# Patient Record
Sex: Female | Born: 1940 | Race: Black or African American | Hispanic: No | Marital: Married | Smoking: Former smoker
Health system: Southern US, Community
[De-identification: ages and names within clinical notes are randomized; demographics above are authoritative.]

## PROBLEM LIST (undated history)

## (undated) DIAGNOSIS — E782 Mixed hyperlipidemia: Secondary | ICD-10-CM

## (undated) DIAGNOSIS — M1990 Unspecified osteoarthritis, unspecified site: Secondary | ICD-10-CM

## (undated) DIAGNOSIS — E079 Disorder of thyroid, unspecified: Secondary | ICD-10-CM

## (undated) DIAGNOSIS — K611 Rectal abscess: Secondary | ICD-10-CM

## (undated) DIAGNOSIS — K219 Gastro-esophageal reflux disease without esophagitis: Secondary | ICD-10-CM

## (undated) DIAGNOSIS — I2510 Atherosclerotic heart disease of native coronary artery without angina pectoris: Secondary | ICD-10-CM

## (undated) DIAGNOSIS — K635 Polyp of colon: Secondary | ICD-10-CM

## (undated) DIAGNOSIS — I679 Cerebrovascular disease, unspecified: Secondary | ICD-10-CM

## (undated) DIAGNOSIS — E119 Type 2 diabetes mellitus without complications: Secondary | ICD-10-CM

## (undated) DIAGNOSIS — I10 Essential (primary) hypertension: Secondary | ICD-10-CM

## (undated) DIAGNOSIS — I219 Acute myocardial infarction, unspecified: Secondary | ICD-10-CM

## (undated) DIAGNOSIS — I82409 Acute embolism and thrombosis of unspecified deep veins of unspecified lower extremity: Secondary | ICD-10-CM

## (undated) DIAGNOSIS — F329 Major depressive disorder, single episode, unspecified: Secondary | ICD-10-CM

## (undated) HISTORY — DX: Atherosclerotic heart disease of native coronary artery without angina pectoris: I25.10

## (undated) HISTORY — DX: Polyp of colon: K63.5

## (undated) HISTORY — PX: SIGMOIDOSCOPY: SUR1295

## (undated) HISTORY — DX: Type 2 diabetes mellitus without complications: E11.9

## (undated) HISTORY — DX: Disorder of thyroid, unspecified: E07.9

## (undated) HISTORY — PX: COLONOSCOPY: SHX174

## (undated) HISTORY — PX: ABDOMINAL HYSTERECTOMY: SHX81

## (undated) HISTORY — DX: Mixed hyperlipidemia: E78.2

## (undated) HISTORY — DX: Cerebrovascular disease, unspecified: I67.9

## (undated) HISTORY — PX: JOINT REPLACEMENT: SHX530

## (undated) HISTORY — DX: Essential (primary) hypertension: I10

---

## 1996-02-18 DIAGNOSIS — I219 Acute myocardial infarction, unspecified: Secondary | ICD-10-CM

## 1996-02-18 HISTORY — DX: Acute myocardial infarction, unspecified: I21.9

## 1997-10-17 ENCOUNTER — Inpatient Hospital Stay (HOSPITAL_COMMUNITY): Admission: RE | Admit: 1997-10-17 | Discharge: 1997-10-20 | Payer: Self-pay | Admitting: Orthopedic Surgery

## 1997-10-20 ENCOUNTER — Inpatient Hospital Stay (HOSPITAL_COMMUNITY)
Admission: RE | Admit: 1997-10-20 | Discharge: 1997-10-25 | Payer: Self-pay | Admitting: Physical Medicine and Rehabilitation

## 1997-10-27 ENCOUNTER — Encounter: Admission: RE | Admit: 1997-10-27 | Discharge: 1998-01-25 | Payer: Self-pay

## 1997-12-18 ENCOUNTER — Ambulatory Visit (HOSPITAL_COMMUNITY): Admission: RE | Admit: 1997-12-18 | Discharge: 1997-12-18 | Payer: Self-pay | Admitting: Orthopedic Surgery

## 1998-06-12 ENCOUNTER — Ambulatory Visit (HOSPITAL_COMMUNITY): Admission: RE | Admit: 1998-06-12 | Discharge: 1998-06-12 | Payer: Self-pay | Admitting: *Deleted

## 1998-12-31 ENCOUNTER — Encounter: Payer: Self-pay | Admitting: Family Medicine

## 1998-12-31 ENCOUNTER — Encounter: Admission: RE | Admit: 1998-12-31 | Discharge: 1998-12-31 | Payer: Self-pay | Admitting: Family Medicine

## 1999-02-07 ENCOUNTER — Encounter: Admission: RE | Admit: 1999-02-07 | Discharge: 1999-02-07 | Payer: Self-pay | Admitting: Family Medicine

## 1999-02-07 ENCOUNTER — Encounter: Payer: Self-pay | Admitting: Family Medicine

## 2000-01-14 ENCOUNTER — Emergency Department (HOSPITAL_COMMUNITY): Admission: EM | Admit: 2000-01-14 | Discharge: 2000-01-14 | Payer: Self-pay | Admitting: Emergency Medicine

## 2000-01-14 ENCOUNTER — Encounter: Payer: Self-pay | Admitting: Emergency Medicine

## 2000-02-13 ENCOUNTER — Encounter: Payer: Self-pay | Admitting: Family Medicine

## 2000-02-13 ENCOUNTER — Encounter: Admission: RE | Admit: 2000-02-13 | Discharge: 2000-02-13 | Payer: Self-pay | Admitting: Family Medicine

## 2000-09-29 ENCOUNTER — Encounter (INDEPENDENT_AMBULATORY_CARE_PROVIDER_SITE_OTHER): Payer: Self-pay | Admitting: *Deleted

## 2000-09-29 ENCOUNTER — Ambulatory Visit (HOSPITAL_COMMUNITY): Admission: RE | Admit: 2000-09-29 | Discharge: 2000-09-29 | Payer: Self-pay | Admitting: *Deleted

## 2001-02-19 ENCOUNTER — Encounter: Payer: Self-pay | Admitting: Family Medicine

## 2001-02-19 ENCOUNTER — Encounter: Admission: RE | Admit: 2001-02-19 | Discharge: 2001-02-19 | Payer: Self-pay | Admitting: Family Medicine

## 2002-03-01 ENCOUNTER — Encounter: Admission: RE | Admit: 2002-03-01 | Discharge: 2002-03-01 | Payer: Self-pay | Admitting: Family Medicine

## 2002-03-01 ENCOUNTER — Encounter: Payer: Self-pay | Admitting: Family Medicine

## 2003-04-03 ENCOUNTER — Encounter: Admission: RE | Admit: 2003-04-03 | Discharge: 2003-04-03 | Payer: Self-pay | Admitting: Family Medicine

## 2003-04-11 ENCOUNTER — Ambulatory Visit (HOSPITAL_COMMUNITY): Admission: RE | Admit: 2003-04-11 | Discharge: 2003-04-11 | Payer: Self-pay | Admitting: Family Medicine

## 2004-02-20 ENCOUNTER — Ambulatory Visit: Payer: Self-pay | Admitting: Cardiovascular Disease

## 2004-02-20 ENCOUNTER — Inpatient Hospital Stay (HOSPITAL_COMMUNITY): Admission: EM | Admit: 2004-02-20 | Discharge: 2004-03-08 | Payer: Self-pay | Admitting: Emergency Medicine

## 2004-02-27 ENCOUNTER — Encounter (INDEPENDENT_AMBULATORY_CARE_PROVIDER_SITE_OTHER): Payer: Self-pay | Admitting: *Deleted

## 2004-02-27 HISTORY — PX: CORONARY ARTERY BYPASS GRAFT: SHX141

## 2004-03-29 ENCOUNTER — Ambulatory Visit: Payer: Self-pay | Admitting: *Deleted

## 2004-04-12 ENCOUNTER — Ambulatory Visit: Payer: Self-pay | Admitting: *Deleted

## 2004-04-17 ENCOUNTER — Ambulatory Visit: Payer: Self-pay | Admitting: *Deleted

## 2004-04-23 ENCOUNTER — Ambulatory Visit: Payer: Self-pay

## 2004-05-09 ENCOUNTER — Ambulatory Visit: Payer: Self-pay | Admitting: Gastroenterology

## 2004-05-15 ENCOUNTER — Ambulatory Visit: Payer: Self-pay | Admitting: Gastroenterology

## 2004-05-21 ENCOUNTER — Encounter: Admission: RE | Admit: 2004-05-21 | Discharge: 2004-05-21 | Payer: Self-pay | Admitting: Family Medicine

## 2004-06-03 ENCOUNTER — Ambulatory Visit: Payer: Self-pay | Admitting: *Deleted

## 2004-06-07 ENCOUNTER — Ambulatory Visit: Payer: Self-pay | Admitting: Gastroenterology

## 2004-08-21 ENCOUNTER — Encounter: Admission: RE | Admit: 2004-08-21 | Discharge: 2004-08-21 | Payer: Self-pay | Admitting: Family Medicine

## 2004-09-04 ENCOUNTER — Ambulatory Visit: Payer: Self-pay | Admitting: Cardiology

## 2004-09-11 ENCOUNTER — Ambulatory Visit: Payer: Self-pay | Admitting: Cardiology

## 2005-04-07 ENCOUNTER — Ambulatory Visit: Payer: Self-pay | Admitting: Cardiology

## 2005-04-11 ENCOUNTER — Emergency Department (HOSPITAL_COMMUNITY): Admission: EM | Admit: 2005-04-11 | Discharge: 2005-04-11 | Payer: Self-pay | Admitting: Emergency Medicine

## 2005-04-17 ENCOUNTER — Ambulatory Visit: Payer: Self-pay | Admitting: Cardiology

## 2005-04-21 ENCOUNTER — Ambulatory Visit: Payer: Self-pay

## 2005-04-25 ENCOUNTER — Ambulatory Visit: Payer: Self-pay | Admitting: Internal Medicine

## 2005-05-01 ENCOUNTER — Ambulatory Visit: Payer: Self-pay

## 2005-05-12 ENCOUNTER — Ambulatory Visit: Payer: Self-pay | Admitting: Internal Medicine

## 2005-05-17 ENCOUNTER — Emergency Department (HOSPITAL_COMMUNITY): Admission: EM | Admit: 2005-05-17 | Discharge: 2005-05-18 | Payer: Self-pay | Admitting: Emergency Medicine

## 2005-05-22 ENCOUNTER — Encounter: Admission: RE | Admit: 2005-05-22 | Discharge: 2005-05-22 | Payer: Self-pay | Admitting: Family Medicine

## 2005-07-02 ENCOUNTER — Ambulatory Visit: Payer: Self-pay | Admitting: Gastroenterology

## 2005-09-30 ENCOUNTER — Ambulatory Visit: Payer: Self-pay | Admitting: Cardiology

## 2005-10-08 ENCOUNTER — Ambulatory Visit: Payer: Self-pay | Admitting: Cardiology

## 2006-03-16 ENCOUNTER — Ambulatory Visit: Payer: Self-pay | Admitting: Gastroenterology

## 2006-03-16 LAB — CONVERTED CEMR LAB
Basophils Relative: 1.2 % — ABNORMAL HIGH (ref 0.0–1.0)
Eosinophils Absolute: 0.1 10*3/uL (ref 0.0–0.6)
Eosinophils Relative: 1 % (ref 0.0–5.0)
HCT: 37.7 % (ref 36.0–46.0)
Hemoglobin: 13.3 g/dL (ref 12.0–15.0)
Lymphocytes Relative: 24.6 % (ref 12.0–46.0)
MCV: 96.8 fL (ref 78.0–100.0)
Monocytes Absolute: 0.6 10*3/uL (ref 0.2–0.7)
Neutro Abs: 5.7 10*3/uL (ref 1.4–7.7)
Neutrophils Relative %: 65.8 % (ref 43.0–77.0)
WBC: 8.6 10*3/uL (ref 4.5–10.5)

## 2006-03-23 ENCOUNTER — Ambulatory Visit: Payer: Self-pay | Admitting: Gastroenterology

## 2006-03-23 LAB — CONVERTED CEMR LAB
Basophils Absolute: 0 10*3/uL (ref 0.0–0.1)
Eosinophils Absolute: 0 10*3/uL (ref 0.0–0.6)
MCHC: 34.9 g/dL (ref 30.0–36.0)
MCV: 96.6 fL (ref 78.0–100.0)
Monocytes Relative: 3.3 % (ref 3.0–11.0)
RBC: 4.15 M/uL (ref 3.87–5.11)
RDW: 12 % (ref 11.5–14.6)

## 2006-03-30 ENCOUNTER — Ambulatory Visit: Payer: Self-pay | Admitting: Gastroenterology

## 2006-04-02 ENCOUNTER — Ambulatory Visit: Payer: Self-pay | Admitting: Cardiology

## 2006-04-17 ENCOUNTER — Ambulatory Visit: Payer: Self-pay

## 2006-04-17 LAB — CONVERTED CEMR LAB
ALT: 21 units/L (ref 0–40)
Alkaline Phosphatase: 44 units/L (ref 39–117)
BUN: 25 mg/dL — ABNORMAL HIGH (ref 6–23)
Bilirubin, Direct: 0.5 mg/dL — ABNORMAL HIGH (ref 0.0–0.3)
CO2: 25 meq/L (ref 19–32)
Calcium: 9.1 mg/dL (ref 8.4–10.5)
Cholesterol: 200 mg/dL (ref 0–200)
Creatinine, Ser: 1 mg/dL (ref 0.4–1.2)
GFR calc Af Amer: 71 mL/min
Glucose, Bld: 120 mg/dL — ABNORMAL HIGH (ref 70–99)
Potassium: 4.9 meq/L (ref 3.5–5.1)
Total Bilirubin: 1.6 mg/dL — ABNORMAL HIGH (ref 0.3–1.2)
Total CHOL/HDL Ratio: 2.6
Total Protein: 6.3 g/dL (ref 6.0–8.3)
Triglycerides: 59 mg/dL (ref 0–149)

## 2006-04-21 ENCOUNTER — Ambulatory Visit: Payer: Self-pay | Admitting: Gastroenterology

## 2006-04-21 LAB — CONVERTED CEMR LAB
BUN: 30 mg/dL — ABNORMAL HIGH (ref 6–23)
Basophils Absolute: 0 10*3/uL (ref 0.0–0.1)
Basophils Relative: 0.4 % (ref 0.0–1.0)
CO2: 24 meq/L (ref 19–32)
Calcium: 9.1 mg/dL (ref 8.4–10.5)
Chloride: 102 meq/L (ref 96–112)
Creatinine, Ser: 1.2 mg/dL (ref 0.4–1.2)
Hemoglobin: 14.6 g/dL (ref 12.0–15.0)
MCHC: 34.9 g/dL (ref 30.0–36.0)
Monocytes Absolute: 0.6 10*3/uL (ref 0.2–0.7)
Monocytes Relative: 4.9 % (ref 3.0–11.0)
Platelets: 259 10*3/uL (ref 150–400)
Potassium: 3.8 meq/L (ref 3.5–5.1)
RBC: 4.36 M/uL (ref 3.87–5.11)
RDW: 12.4 % (ref 11.5–14.6)

## 2006-04-22 ENCOUNTER — Ambulatory Visit: Payer: Self-pay | Admitting: Gastroenterology

## 2006-04-22 ENCOUNTER — Encounter (INDEPENDENT_AMBULATORY_CARE_PROVIDER_SITE_OTHER): Payer: Self-pay | Admitting: *Deleted

## 2006-04-22 ENCOUNTER — Ambulatory Visit (HOSPITAL_COMMUNITY): Admission: RE | Admit: 2006-04-22 | Discharge: 2006-04-22 | Payer: Self-pay | Admitting: Gastroenterology

## 2006-04-28 ENCOUNTER — Ambulatory Visit: Payer: Self-pay | Admitting: Gastroenterology

## 2006-05-11 ENCOUNTER — Ambulatory Visit: Payer: Self-pay | Admitting: Gastroenterology

## 2006-06-18 ENCOUNTER — Encounter: Admission: RE | Admit: 2006-06-18 | Discharge: 2006-06-18 | Payer: Self-pay | Admitting: Family Medicine

## 2006-08-20 ENCOUNTER — Inpatient Hospital Stay (HOSPITAL_COMMUNITY): Admission: EM | Admit: 2006-08-20 | Discharge: 2006-08-27 | Payer: Self-pay | Admitting: Emergency Medicine

## 2006-08-20 ENCOUNTER — Ambulatory Visit: Payer: Self-pay | Admitting: Internal Medicine

## 2006-08-23 ENCOUNTER — Encounter (INDEPENDENT_AMBULATORY_CARE_PROVIDER_SITE_OTHER): Payer: Self-pay | Admitting: General Surgery

## 2006-08-23 HISTORY — PX: CHOLECYSTECTOMY: SHX55

## 2006-08-24 ENCOUNTER — Encounter (INDEPENDENT_AMBULATORY_CARE_PROVIDER_SITE_OTHER): Payer: Self-pay | Admitting: Internal Medicine

## 2006-10-01 ENCOUNTER — Ambulatory Visit: Payer: Self-pay | Admitting: Cardiology

## 2006-10-13 ENCOUNTER — Ambulatory Visit: Payer: Self-pay | Admitting: Cardiology

## 2006-10-13 LAB — CONVERTED CEMR LAB
BUN: 21 mg/dL (ref 6–23)
CO2: 27 meq/L (ref 19–32)
Calcium: 9.5 mg/dL (ref 8.4–10.5)
Chloride: 105 meq/L (ref 96–112)
Creatinine, Ser: 1 mg/dL (ref 0.4–1.2)
GFR calc Af Amer: 71 mL/min
Glucose, Bld: 102 mg/dL — ABNORMAL HIGH (ref 70–99)

## 2007-06-11 ENCOUNTER — Ambulatory Visit: Payer: Self-pay | Admitting: Gastroenterology

## 2007-06-21 ENCOUNTER — Encounter: Admission: RE | Admit: 2007-06-21 | Discharge: 2007-06-21 | Payer: Self-pay | Admitting: Family Medicine

## 2007-06-25 ENCOUNTER — Ambulatory Visit: Payer: Self-pay | Admitting: Gastroenterology

## 2007-06-25 ENCOUNTER — Encounter: Payer: Self-pay | Admitting: Gastroenterology

## 2007-06-28 ENCOUNTER — Encounter: Payer: Self-pay | Admitting: Gastroenterology

## 2007-07-07 ENCOUNTER — Ambulatory Visit: Payer: Self-pay | Admitting: Cardiology

## 2007-07-14 ENCOUNTER — Ambulatory Visit: Payer: Self-pay | Admitting: Cardiology

## 2007-07-14 LAB — CONVERTED CEMR LAB
ALT: 11 units/L (ref 0–35)
AST: 20 units/L (ref 0–37)
Albumin: 3.2 g/dL — ABNORMAL LOW (ref 3.5–5.2)
BUN: 15 mg/dL (ref 6–23)
Calcium: 9.3 mg/dL (ref 8.4–10.5)
Chloride: 103 meq/L (ref 96–112)
Cholesterol: 123 mg/dL (ref 0–200)
Creatinine, Ser: 0.7 mg/dL (ref 0.4–1.2)
GFR calc non Af Amer: 88 mL/min
HDL: 43.9 mg/dL (ref >39.0)
LDL Cholesterol: 64 mg/dL (ref 0–99)
TSH: 0.99 microintl units/mL (ref 0.35–5.50)
Total CHOL/HDL Ratio: 2.8
Triglycerides: 75 mg/dL (ref 0–149)

## 2007-07-20 ENCOUNTER — Telehealth: Payer: Self-pay | Admitting: Gastroenterology

## 2008-02-18 DIAGNOSIS — I679 Cerebrovascular disease, unspecified: Secondary | ICD-10-CM

## 2008-02-18 HISTORY — DX: Cerebrovascular disease, unspecified: I67.9

## 2008-06-21 ENCOUNTER — Encounter: Admission: RE | Admit: 2008-06-21 | Discharge: 2008-06-21 | Payer: Self-pay | Admitting: Family Medicine

## 2008-07-10 ENCOUNTER — Telehealth: Payer: Self-pay | Admitting: Cardiology

## 2008-07-11 DIAGNOSIS — K529 Noninfective gastroenteritis and colitis, unspecified: Secondary | ICD-10-CM

## 2008-07-11 DIAGNOSIS — I2510 Atherosclerotic heart disease of native coronary artery without angina pectoris: Secondary | ICD-10-CM | POA: Insufficient documentation

## 2008-07-11 DIAGNOSIS — I679 Cerebrovascular disease, unspecified: Secondary | ICD-10-CM

## 2008-07-11 DIAGNOSIS — I10 Essential (primary) hypertension: Secondary | ICD-10-CM | POA: Insufficient documentation

## 2008-07-11 DIAGNOSIS — E785 Hyperlipidemia, unspecified: Secondary | ICD-10-CM

## 2008-07-12 ENCOUNTER — Ambulatory Visit: Payer: Self-pay | Admitting: Cardiology

## 2008-07-12 DIAGNOSIS — F17200 Nicotine dependence, unspecified, uncomplicated: Secondary | ICD-10-CM | POA: Insufficient documentation

## 2008-07-12 DIAGNOSIS — R05 Cough: Secondary | ICD-10-CM

## 2008-07-14 ENCOUNTER — Telehealth: Payer: Self-pay | Admitting: Cardiology

## 2008-07-20 ENCOUNTER — Ambulatory Visit: Payer: Self-pay

## 2008-07-24 ENCOUNTER — Encounter: Payer: Self-pay | Admitting: Cardiology

## 2008-07-27 ENCOUNTER — Telehealth (INDEPENDENT_AMBULATORY_CARE_PROVIDER_SITE_OTHER): Payer: Self-pay | Admitting: *Deleted

## 2008-08-02 ENCOUNTER — Telehealth: Payer: Self-pay | Admitting: Cardiology

## 2008-08-04 ENCOUNTER — Emergency Department (HOSPITAL_COMMUNITY): Admission: EM | Admit: 2008-08-04 | Discharge: 2008-08-04 | Payer: Self-pay | Admitting: Family Medicine

## 2008-08-04 ENCOUNTER — Ambulatory Visit (HOSPITAL_COMMUNITY): Admission: RE | Admit: 2008-08-04 | Discharge: 2008-08-04 | Payer: Self-pay | Admitting: Cardiology

## 2008-08-15 ENCOUNTER — Telehealth: Payer: Self-pay | Admitting: Cardiology

## 2008-09-20 ENCOUNTER — Encounter (HOSPITAL_COMMUNITY): Admission: RE | Admit: 2008-09-20 | Discharge: 2008-11-21 | Payer: Self-pay | Admitting: Internal Medicine

## 2008-10-05 ENCOUNTER — Ambulatory Visit: Payer: Self-pay | Admitting: *Deleted

## 2008-11-28 ENCOUNTER — Telehealth: Payer: Self-pay | Admitting: Cardiology

## 2008-12-12 ENCOUNTER — Telehealth: Payer: Self-pay | Admitting: Cardiology

## 2008-12-27 ENCOUNTER — Telehealth: Payer: Self-pay | Admitting: Cardiology

## 2009-03-29 ENCOUNTER — Ambulatory Visit: Payer: Self-pay | Admitting: Gastroenterology

## 2009-03-29 LAB — CONVERTED CEMR LAB
AST: 22 units/L (ref 0–37)
Alkaline Phosphatase: 64 units/L (ref 39–117)
Basophils Absolute: 0 10*3/uL (ref 0.0–0.1)
Bilirubin, Direct: 0.2 mg/dL (ref 0.0–0.3)
Lymphocytes Relative: 26.6 % (ref 12.0–46.0)
Lymphs Abs: 1.9 10*3/uL (ref 0.7–4.0)
Monocytes Relative: 5 % (ref 3.0–12.0)
Neutrophils Relative %: 64.4 % (ref 43.0–77.0)
Platelets: 207 10*3/uL (ref 150.0–400.0)
RDW: 13 % (ref 11.5–14.6)
Total Bilirubin: 0.6 mg/dL (ref 0.3–1.2)

## 2009-04-17 ENCOUNTER — Ambulatory Visit: Payer: Self-pay | Admitting: Vascular Surgery

## 2009-06-26 ENCOUNTER — Encounter: Admission: RE | Admit: 2009-06-26 | Discharge: 2009-06-26 | Payer: Self-pay | Admitting: Family Medicine

## 2009-06-29 ENCOUNTER — Ambulatory Visit: Payer: Self-pay | Admitting: Cardiology

## 2009-07-23 ENCOUNTER — Telehealth (INDEPENDENT_AMBULATORY_CARE_PROVIDER_SITE_OTHER): Payer: Self-pay | Admitting: *Deleted

## 2009-07-24 ENCOUNTER — Ambulatory Visit: Payer: Self-pay | Admitting: Cardiology

## 2009-07-24 ENCOUNTER — Ambulatory Visit: Payer: Self-pay

## 2009-07-24 ENCOUNTER — Ambulatory Visit: Payer: Self-pay | Admitting: Cardiovascular Disease

## 2009-07-24 ENCOUNTER — Encounter (HOSPITAL_COMMUNITY): Admission: RE | Admit: 2009-07-24 | Discharge: 2009-08-14 | Payer: Self-pay | Admitting: Cardiology

## 2009-07-24 ENCOUNTER — Encounter: Payer: Self-pay | Admitting: Cardiovascular Disease

## 2009-07-26 ENCOUNTER — Telehealth: Payer: Self-pay | Admitting: Cardiology

## 2010-03-11 ENCOUNTER — Encounter: Payer: Self-pay | Admitting: Cardiology

## 2010-03-17 LAB — CONVERTED CEMR LAB
AST: 20 units/L (ref 0–37)
Albumin: 3.7 g/dL (ref 3.5–5.2)
Alkaline Phosphatase: 62 units/L (ref 39–117)
BUN: 19 mg/dL (ref 6–23)
Bilirubin, Direct: 0.2 mg/dL (ref 0.0–0.3)
Calcium: 9.2 mg/dL (ref 8.4–10.5)
Chloride: 103 meq/L (ref 96–112)
Chloride: 106 meq/L (ref 96–112)
Cholesterol: 117 mg/dL (ref 0–200)
Cholesterol: 139 mg/dL (ref 0–200)
Creatinine, Ser: 1 mg/dL (ref 0.4–1.2)
HDL: 60.3 mg/dL (ref >39.00)
LDL Cholesterol: 47 mg/dL (ref 0–99)
Potassium: 4.1 meq/L (ref 3.5–5.1)
Sodium: 141 meq/L (ref 135–145)
Total Protein: 6.1 g/dL (ref 6.0–8.3)
Total Protein: 6.9 g/dL (ref 6.0–8.3)
Triglycerides: 74 mg/dL (ref 0.0–149.0)
VLDL: 8.4 mg/dL (ref 0.0–40.0)

## 2010-03-21 NOTE — Progress Notes (Signed)
Summary: Nuclear Pre-Procedure  Phone Note Outgoing Call   Call placed by: Milana Na, EMT-P,  July 23, 2009 3:40 PM Summary of Call: Reviewed information on Myoview Information Sheet (see scanned document for further details).  Spoke with patient.     Nuclear Med Background Indications for Stress Test: Evaluation for Ischemia, Graft Patency   History: CABG, COPD, Echo, Myocardial Perfusion Study  History Comments: '06 CABG x5  COPD per CXR  '08 MPS NL EF 77% 07/08 ECHO EF 55-60% mild AI and TR  Symptoms: DOE, SOB    Nuclear Pre-Procedure Cardiac Risk Factors: Carotid Disease, Hypertension, Lipids, Smoker Height (in): 69  Nuclear Med Study Referring MD:  B.Crenshaw

## 2010-03-21 NOTE — Assessment & Plan Note (Signed)
Summary: Cardiology Nuclear Study  Nuclear Med Background Indications for Stress Test: Evaluation for Ischemia, Graft Patency   History: CABG, COPD, Echo, Myocardial Perfusion Study  History Comments: '06 CABG x 5; '08 EAV:WUJWJX, EF=77%; 7/08 Echo:EF=55-60%, mild AI and TR; h/o ulcerative colitis  Symptoms: Dizziness, DOE, Fatigue, SOB    Nuclear Pre-Procedure Cardiac Risk Factors: Carotid Disease, History of Smoking, Hypertension, Lipids, Obesity, Smoker Caffeine/Decaff Intake: None NPO After: 10:00 PM Lungs: Minimal expiratory wheezes.  Albuterol inhaler used prior to infusion.  O2 Sat 96% on RA. IV 0.9% NS with Angio Cath: 22g     IV Site: (R) wrist IV Started by: Stanton Kidney EMT-P Chest Size (in) 44     Cup Size B     Height (in): 69 Weight (lb): 226 BMI: 33.50 Tech Comments: Held metoprolol held x 24 hours; no meds. taken this a.m., per patient. Patient quit smoking x 5 days.  Nuclear Med Study 1 or 2 day study:  1 day     Stress Test Type:  Eugenie Birks Reading MD:  Charlton Haws, MD     Referring MD:  Olga Millers, MD Resting Radionuclide:  Technetium 67m Tetrofosmin     Resting Radionuclide Dose:  10.6 mCi  Stress Radionuclide:  Technetium 75m Tetrofosmin     Stress Radionuclide Dose:  33 mCi   Stress Protocol   Lexiscan: 0.4 mg   Stress Test Technologist:  Rea [employer] CMA-N     Nuclear Technologist:  Domenic Polite CNMT  Rest Procedure  Myocardial perfusion imaging was performed at rest 45 minutes following the intravenous administration of Myoview Technetium 92m Tetrofosmin.  Stress Procedure  The patient received IV Lexiscan 0.4 mg over 15-seconds.  Myoview injected at 30-seconds.  There were no significant changes with infusion.  Quantitative spect images were obtained after a 45 minute delay.  QPS Raw Data Images:  Normal; no motion artifact; normal heart/lung ratio. Stress Images:  NI: Uniform and normal uptake of tracer in all myocardial segments. Rest  Images:  Normal homogeneous uptake in all areas of the myocardium. Subtraction (SDS):  Normal Transient Ischemic Dilatation:  .98  (Normal <1.22)  Lung/Heart Ratio:  .25  (Normal <0.45)  Quantitative Gated Spect Images QGS EDV:  70 ml QGS ESV:  16 ml QGS EF:  76 % QGS cine images:  normal  Findings Normal nuclear study      Overall Impression  Exercise Capacity: Lexiscan BP Response: Normal blood pressure response. Clinical Symptoms: "Woozy" ECG Impression: No significant ST segment change suggestive of ischemia. Overall Impression: Normal stress nuclear study.  Appended Document: Cardiology Nuclear Study PT AWARE./CY  Appended Document: Cardiology Nuclear Study ok

## 2010-03-21 NOTE — Miscellaneous (Signed)
Summary: Updated Thyrpoid med  Clinical Lists Changes  Medications: Added new medication of METHIMAZOLE 5 MG TABS (METHIMAZOLE) 1 by mouth once daily

## 2010-03-21 NOTE — Assessment & Plan Note (Signed)
Summary: F/U for Lialda refills-rs   History of Present Illness Visit Type: Follow-up Visit Primary GI MD: Melvia Heaps MD [facility] Primary Provider: Doneen Poisson, MD Chief Complaint: Patient here to get refills on her Lialda. She states that she only sees [facility] when she wipes her rectum too hard after a BM.  History of Present Illness:   [name] has returned for followup of her left-sided colitis.  She remains only although 4.8 g daily.  Left-sided colitis was diagnosed in 2006.  Colonoscopy in May, 2009 showed very mild colitis involving the left colon.  She is feeling well and has no GI complaints.   GI Review of Systems      Denies abdominal pain, acid reflux, belching, bloating, chest pain, dysphagia with liquids, dysphagia with solids, heartburn, loss of appetite, nausea, vomiting, vomiting blood, weight loss, and  weight gain.        Denies anal fissure, black tarry stools, change in bowel habit, constipation, diarrhea, diverticulosis, fecal incontinence, heme positive stool, hemorrhoids, irritable bowel syndrome, jaundice, light color stool, liver problems, rectal bleeding, and  rectal pain.    Current Medications (verified): 1)  Lialda 1.2 Gm  Tbec (Mesalamine) .... Take 4 Tablets By Mouth Once Daily 2)  Crestor 40 Mg Tabs (Rosuvastatin Calcium) .... Take 1 Tablet By Mouth At Bedtime 3)  Diovan 320 Mg Tabs (Valsartan) .Marland Kitchen.. 1 Tab By Mouth Once Daily 4)  Hydrochlorothiazide 12.5 Mg Caps (Hydrochlorothiazide) .Marland Kitchen.. 1 Tab By Mouth Once Daily 5)  Metoprolol Tartrate 25 Mg Tabs (Metoprolol Tartrate) .Marland Kitchen.. 1 Tab By Mouth Two Times A Day 6)  Thryroid Medication .... Take One By Mouth Once Daily  Allergies: 1)  Aspirin (Aspirin)  Past History:  Past Medical History: CAD, UNSPECIFIED SITE (ICD-414.00) CEREBROVASCULAR DISEASE (ICD-437.9) HYPERLIPIDEMIA-MIXED (ICD-272.4) HYPERTENSION, UNSPECIFIED (ICD-401.9) ULCERATIVE COLITIS, HX OF (ICD-V12.79) Diverticulosis Hx of colon polyps   2006 Thyroid disease  Past Surgical History: Reviewed history from 07/11/2008 and no changes required. Cholecystectomy with cholangiogram..08/23/2006  CABG x5.. 02/27/2004 Intraoperative transesophageal echocardiography (TEE)...02/27/2004  Family History: Reviewed history from 07/11/2008 and no changes required. Siblings: Brother had Crohn's disease... Another brother had an undefined colitis Family History of Cancer:  Mother deceased with cancer of the stomach Family History of CVA or Stroke:  Father deceased, CVA.   Siblings:  sister had brain tumor.  Social History: Tobacco Use -yes 4 ciggs per day Alcohol Use - no..quit in 2007 Drug Use - no Daily Caffeine Use rare coffee  Review of Systems       The patient complains of allergy/sinus, arthritis/joint pain, back pain, cough, fatigue, muscle pains/cramps, shortness of breath, and sleeping problems.  The patient denies anemia, anxiety-new, blood in urine, breast changes/lumps, change in vision, confusion, coughing up blood, depression-new, fainting, fever, headaches-new, hearing problems, heart murmur, heart rhythm changes, itching, menstrual pain, night sweats, nosebleeds, pregnancy symptoms, skin rash, sore throat, swelling of feet/legs, swollen lymph glands, thirst - excessive , urination - excessive , urination changes/pain, urine leakage, vision changes, and voice change.         All other systems were reviewed and were negative   Vital Signs:  Patient profile:   70 year old female Height:      69 inches Weight:      233.4 pounds BMI:     34.59 Pulse rate:   76 / minute Pulse rhythm:   regular BP sitting:   98 / 64  (left arm) Cuff size:   regular  Vitals Entered By:  Harlow Mares CMA Duncan Dull) (March 29, 2009 10:01 AM)  Physical Exam  Additional Exam:  She is a healthy-appearing female  skin: anicteric HEENT: normocephalic; PEERLA; no nasal or pharyngeal abnormalities neck: supple nodes: no cervical  lymphadenopathy chest: clear to ausculatation and percussion heart: no murmurs, gallops, or rubs abd: soft, nontender; BS normoactive; no abdominal masses, tenderness, organomegaly rectal: deferred ext: no cynanosis, clubbing, edema skeletal: no deformities neuro: oriented x 3; no focal abnormalities    Impression & Recommendations:  Problem # 1:  ULCERATIVE COLITIS, HX OF (ICD-V12.79) the patient continues to do well on lialda.  Recommendations #1 reduce lialda 22.4 g daily Orders: TLB-CBC Platelet - w/Differential (85025-CBCD) TLB-Hepatic/Liver Function Pnl (80076-HEPATIC)  Problem # 2:  CEREBROVASCULAR DISEASE (ICD-437.9) Assessment: Comment Only  Problem # 3:  CAD, UNSPECIFIED SITE (ICD-414.00) Assessment: Comment Only  Patient Instructions: 1)  cc Dr. Clyda Greener

## 2010-03-21 NOTE — Assessment & Plan Note (Signed)
Summary: 1 YR F/U   Primary Provider:  Doneen Poisson, MD  CC:  sob.  History of Present Illness: [name] is a very pleasant female that I have seen in the past for coronary artery disease.  She has had prior coronary artery bypass grafting in 2006.  Her LV function is normal.  Echocardiogram in July of 2008 revealed normal LV function, mild aortic insufficiency and mild tricuspid regurgitation. Her most recent Myoview was performed on April 17, 2006.  There was no scar or ischemia and her ejection fraction was 77%.  There was mild breast attenuation. Carotid Doppler in June of 2010 and were technically difficult and CTA was recommended to evaluate this and thyroid. This revealed extensive atherosclerotic plaque at both carotid bifurcation regions.  On the right, the lumen is not narrowed beyond that of the more distal cervical ICA.  On the left, at the distal bulb, the lumen is narrowed to 2 mm.  This indicates a 60% stenosis when compared to the more distal cervical ICA. I believe there are stenoses of both vertebral artery origins approximately 50% in severity.  The left vertebral artery takes a direct origin from the arch. Thyroid was enlarged and felt to be consistent with goiter. I last saw her in May 2010. Since then she has dyspnea with more extreme activities but not with routine activities. It is relieved with rest. There is no orthopnea, PND, pedal edema, palpitations or syncope. Note she does not have associated chest pain or dyspnea. She does not have exertional chest pain.  Current Medications (verified): 1)  Lialda 1.2 Gm  Tbec (Mesalamine) .... Take 4 Tablets By Mouth Once Daily 2)  Crestor 40 Mg Tabs (Rosuvastatin Calcium) .... Take 1 Tablet By Mouth At Bedtime 3)  Diovan 320 Mg Tabs (Valsartan) .Marland Kitchen.. 1 Tab By Mouth Once Daily 4)  Hydrochlorothiazide 12.5 Mg Caps (Hydrochlorothiazide) .Marland Kitchen.. 1 Tab By Mouth Once Daily 5)  Metoprolol Tartrate 25 Mg Tabs (Metoprolol Tartrate) .Marland Kitchen.. 1 Tab  By Mouth Two Times A Day 6)  Methimazole 5 Mg Tabs (Methimazole) .Marland Kitchen.. 1 By Mouth Once Daily  Allergies: 1)  Aspirin (Aspirin)  Past History:  Past Medical History: Reviewed history from 03/29/2009 and no changes required. CAD, UNSPECIFIED SITE (ICD-414.00) CEREBROVASCULAR DISEASE (ICD-437.9) HYPERLIPIDEMIA-MIXED (ICD-272.4) HYPERTENSION, UNSPECIFIED (ICD-401.9) ULCERATIVE COLITIS, HX OF (ICD-V12.79) Diverticulosis Hx of colon polyps  2006 Thyroid disease  Past Surgical History: Cholecystectomy with cholangiogram..08/23/2006  CABG x5.. 02/27/2004  Social History: Reviewed history from 03/29/2009 and no changes required. Tobacco Use -yes 4 ciggs per day Alcohol Use - no..quit in 2007 Drug Use - no Daily Caffeine Use rare coffee  Review of Systems       Some pain in the lower back but no fevers or chills, productive cough, hemoptysis, dysphasia, odynophagia, melena, hematochezia, dysuria, hematuria, rash, seizure activity, orthopnea, PND, pedal edema, claudication. Remaining systems are negative.   Vital Signs:  Patient profile:   70 year old female Height:      69 inches Weight:      227 pounds BMI:     33.64 Pulse rate:   69 / minute Resp:     14 per minute BP sitting:   88 / 64  (left arm)  Vitals Entered By: Kem Parkinson (Jun 29, 2009 9:29 AM)  Physical Exam  General:  Well-developed well-nourished in no acute distress.  Skin is warm and dry.  HEENT is normal.  Neck is supple. No thyromegaly.  Chest is clear to auscultation  with normal expansion.  Cardiovascular exam is regular rate and rhythm.  Abdominal exam nontender or distended. No masses palpated. Extremities show no edema. neuro grossly intact    EKG  Procedure date:  06/29/2009  Findings:      Sinus rhythm at a rate of 69. No significant ST changes. Poor R-wave progression.  Impression & Recommendations:  Problem # 1:  TOBACCO ABUSE (ICD-305.1) Patient counseled on  discontinuing.  Problem # 2:  CAD, UNSPECIFIED SITE (ICD-414.00)  Continue beta blocker and statin. He is not on Plavix and apparently this caused increased bleeding from diverticula. I have asked her to discuss a baby aspirin with her gastroenterologist. Her updated medication list for this problem includes:    Metoprolol Tartrate 25 Mg Tabs (Metoprolol tartrate) .Marland Kitchen... 1 tab by mouth two times a day  Her updated medication list for this problem includes:    Metoprolol Tartrate 25 Mg Tabs (Metoprolol tartrate) .Marland Kitchen... 1 tab by mouth two times a day  Problem # 3:  DYSPNEA (ICD-786.05) We are planning Myoview for risk stratification. Most likely secondary to deconditioning. Her updated medication list for this problem includes:    Diovan 160 Mg Tabs (Valsartan) .Marland Kitchen... Take one tablet by mouth daily    Hydrochlorothiazide 12.5 Mg Caps (Hydrochlorothiazide) .Marland Kitchen... 1 tab by mouth once daily    Metoprolol Tartrate 25 Mg Tabs (Metoprolol tartrate) .Marland Kitchen... 1 tab by mouth two times a day  Orders: Nuclear Stress Test (Nuc Stress Test)  Problem # 4:  CEREBROVASCULAR DISEASE (ICD-437.9) Continue statin. No aspirin as described above. Followup carotid Dopplers. Orders: Carotid Duplex (Carotid Duplex)  Problem # 5:  HYPERLIPIDEMIA-MIXED (ICD-272.4)  Continue statin. Check lipids and liver. Her updated medication list for this problem includes:    Crestor 40 Mg Tabs (Rosuvastatin calcium) .Marland Kitchen... Take 1 tablet by mouth at bedtime  Her updated medication list for this problem includes:    Crestor 40 Mg Tabs (Rosuvastatin calcium) .Marland Kitchen... Take 1 tablet by mouth at bedtime  Problem # 6:  HYPERTENSION, UNSPECIFIED (ICD-401.9) Blood pressure low. Decrease Diovan to 160 mg p.o. daily. Check bmet. Her updated medication list for this problem includes:    Diovan 160 Mg Tabs (Valsartan) .Marland Kitchen... Take one tablet by mouth daily    Hydrochlorothiazide 12.5 Mg Caps (Hydrochlorothiazide) .Marland Kitchen... 1 tab by mouth once  daily    Metoprolol Tartrate 25 Mg Tabs (Metoprolol tartrate) .Marland Kitchen... 1 tab by mouth two times a day  Problem # 7:  ULCERATIVE COLITIS, HX OF (ICD-V12.79)  Patient Instructions: 1)  Your physician recommends that you schedule a follow-up appointment in: ONE YEAR 2)  Your physician recommends that you return for lab work EA:VWUJ STRESS TEST-BMP/LIPID/LIVER/401.1/272.0/V58.69 3)  Your physician has recommended you make the following change in your medication: DECREASE DIOVAN 160MG  ONCE DAILY 4)  Your physician has requested that you have a carotid duplex. This test is an ultrasound of the carotid arteries in your neck. It looks at blood flow through these arteries that supply the brain with blood. Allow one hour for this exam. There are no restrictions or special instructions. 5)  Your physician has requested that you have an exercise stress myoview.  For further information please visit [email or web address]  Please follow instruction sheet, as given. Prescriptions: DIOVAN 160 MG TABS (VALSARTAN) Take one tablet by mouth daily  #90 x 3   Entered by:   Deliah Goody, RN   Authorized by:   Ferman Hamming, MD, [facility]   Signed by:   Stanton Kidney  Betsey Holiday, RN on 06/29/2009   Method used:   Electronically to        [facility]* (mail-order)             ,          Ph: [phone]       Fax: [phone]   RxID:   [id number]

## 2010-03-21 NOTE — Progress Notes (Signed)
Summary: RTN CALL  Phone Note Call from Patient Call back at Home Phone [phone]   Caller: Patient Reason for Call: Talk to Nurse Summary of Call: RTN CALL Initial call taken by: Glynda Jaeger,  July 26, 2009 4:35 PM  Follow-up for Phone Call        Left message to call back. Julieta Gutting, RN, BSN  July 27, 2009 6:25 PM  Additional Follow-up for Phone Call Additional follow up Details #1::        Pt was returning a call from last week.  She said she had gotten the results of her labs and the stress test, but not the carotid dopplers.  So I gave her the results.  She verbalized understanding. Additional Follow-up by: Minerva Areola, RN, BSN,  July 30, 2009 8:54 AM

## 2010-03-21 NOTE — Procedures (Signed)
Summary: Colonoscopy/GCDD  Colonoscopy/[facility]D   Imported By: Sherian Rein 03/29/2009 15:17:45  _____________________________________________________________________  External Attachment:    Type:   Image     Comment:   External Document

## 2010-03-21 NOTE — Procedures (Signed)
Summary: Flexible Sigmoidoscopy   Flexible Sigmoidoscopy  Procedure date:  04/22/2006  Findings:      556.9:Colitis 562.10: Diverticulosis  Comments:      Location: [facility].   Flexible Sigmoidoscopy  Procedure date:  04/22/2006  Findings:      556.9:Colitis 562.10: Diverticulosis  Comments:      Location: [facility].  Patient Name: [name], [name] MRN: [id number] Procedure Procedures: Flexible Proctosigmoidoscopy CPT: [id number].    with biopsy. CPT: 45331.  Personnel: Endoscopist: Barbette Hair. Arlyce Dice, MD.  Indications Symptoms: Diarrhea.  History  Current Medications: Patient is not currently taking Coumadin.  Allergies: No known allergies. Allergic to NSAIDA, ASA, Zocor.  Pre-Exam Physical: Performed Apr 22, 2006. Entire physical exam was normal.  Exam Exam: Extent visualized: Descending Colon. Extent of exam: 50 cm. ASA Classification: II. Tolerance: good.  Sedation Meds: Fentanyl 50 mcg. given IV. Versed 6 mg. given IV.  Findings - DIVERTICULOSIS: Descending Colon to Sigmoid Colon. ICD9: Diverticulosis: 562.10. Comments: Scattered diverticula.  - MUCOSAL ABNORMALITY: Descending Colon to Rectum. Erythema present. Erosions present, edematous, Activity level moderate, Biopsy/Misc Colitis taken. ICD9: Colitis, Ulcerative: 556.9. Comments: Diffuse, moderate inflammatory changes beginning in rectum and extending into left colon with most severe changes in sigmoid between 20 and 30 cm.  There's punctate erythema above this area.   Assessment Abnormal examination, see findings above.  Diagnoses: 556.9: Colitis, Ulcerative.  562.10: Diverticulosis.   Events  Unplanned Intervention: No intervention was required.  Unplanned Events: There were no complications. Plans Medication Plan: Continue current medications. Anti-diarrheal: Cortfoam 1 HS, starting Apr 22, 2006   Scheduling: Office Visit, to [employer]. Arlyce Dice, MD, around May 02, 2006.      This report was created from the original endoscopy report, which was reviewed and signed by the above listed endoscopist.

## 2010-03-21 NOTE — Procedures (Signed)
Summary: colonoscopy   Colonoscopy  Procedure date:  06/25/2007  Findings:      Results: Colitis.       Location:  [facility].   Patient Name: [name], [name] MRN: [id number] Procedure Procedures: Colonoscopy CPT: 03474.    with biopsy. CPT: [id number].  Personnel: Endoscopist: Barbette Hair. Arlyce Dice, MD.  Exam Location: Outpatient  Patient Consent: Procedure, Alternatives, Risks and Benefits discussed, consent obtained, from patient.  Indications  Surveillance of: Ulcerative Colitis. 2006.  History  Current Medications: Patient is not currently taking Coumadin.  Allergies: No known allergies. Allergic to NSAIDA, ASA, Zocor.  Pre-Exam Physical: Performed Jun 25, 2007. Cardio-pulmonary exam, HEENT exam , Abdominal exam, Mental status exam WNL.  Comments: Patient history reviewed/updated, physical performed prior to initiation of sedation?yes Exam Exam: Extent of exam reached: Cecum, extent intended: Cecum.  The cecum was identified by appendiceal orifice and IC valve. Time to Cecum: 00:01: 51. Time for Withdrawl: 00:06:30. Colon retroflexion performed. ASA Classification: II. Tolerance: good.  Monitoring: Pulse and BP monitoring, Oximetry used. Supplemental O2 given. at 2 Liters.  Colon Prep Used Miralax for colon prep. Prep results: good.  Sedation Meds: Patient assessed and found to be appropriate for moderate (conscious) sedation. Sedation was managed by the Endoscopist. Fentanyl 50 mcg. given IV. Versed 5 mg. given IV.  Findings - NORMAL EXAM: Cecum to Sigmoid Colon.  NORMAL EXAM: Cecum.  - MUCOSAL ABNORMALITY: Sigmoid Colon. Erythema present. Erosions absent, Granularity absent, bleeding absent, Haustral folds normal. Activity level inactive, absent ulcers present. Biopsy/Mucosal Abn. taken. ICD9: Colitis, Unspecified: 558.9. Comments: 10cm segment from 20-30cm from anus with areas of submucosal hemorrhage.  Minimal erythema.  Random biopsies were  taken.  - NORMAL EXAM: Sigmoid Colon to Rectum.   Assessment Abnormal examination, see findings above.  Diagnoses: 558.9: Colitis, Unspecified.   Events  Unplanned Interventions: No intervention was required.  Unplanned Events: There were no complications. Plans  Post Exam Instructions: Post sedation instructions given.  Medication Plan: Continue current medications.  Disposition: After procedure patient sent to recovery. After recovery patient sent home.  Scheduling/Referral: Office Visit, to [employer]. Arlyce Dice, MD, around Dec 26, 2007.    This report was created from the original endoscopy report, which was reviewed and signed by the above listed endoscopist.

## 2010-05-09 ENCOUNTER — Other Ambulatory Visit: Payer: Self-pay | Admitting: Cardiology

## 2010-05-16 NOTE — Telephone Encounter (Signed)
[address] °

## 2010-05-18 ENCOUNTER — Other Ambulatory Visit: Payer: Self-pay | Admitting: Gastroenterology

## 2010-07-02 ENCOUNTER — Other Ambulatory Visit: Payer: Self-pay | Admitting: Family Medicine

## 2010-07-02 DIAGNOSIS — Z1231 Encounter for screening mammogram for malignant neoplasm of breast: Secondary | ICD-10-CM

## 2010-07-02 NOTE — H&P (Signed)
[name], [name]                 ACCOUNT NO.:  [id number]   MEDICAL RECORD NO.:  [id number]          PATIENT TYPE:  INP   LOCATION:  1409                         FACILITY:  [facility]   PHYSICIAN:  Anselm Pancoast. Weatherly, M.D.DATE OF BIRTH:  1941/01/03   DATE OF ADMISSION:  08/20/2006  DATE OF DISCHARGE:                              HISTORY & PHYSICAL   CHIEF COMPLAINT:  Epigastric pain.   HISTORY:  [name] is a 70 year old female, moderately overweight, on  oral medication for diabetes, who was admitted by the [facility] on  the 3rd, after presenting to the emergency room with epigastric right  lower chest discomfort.  She was seen in the ER with a EKG and then a CT  obtained.  The CT showed a stone in the gallbladder.  The stone had been  noted in the gallbladder in a CT in 2001, and the patient probably had  some reaction to the IV contrast, and then she experienced shortness of  breath and etc. and was admitted to the [facility] team at that time.  She has been on insulin.  A Foley catheter had been inserted.  Her  symptoms appear to have subsided, and the patient was informed about  stone, which she said she was not aware that she has had.  She wanted it  removed this admission.  I do think that the pain that she had in the  home before coming to the emergency room was certainly consistent with a  mild gallbladder attack.  The patient's liver function studies are  normal, as are her white blood count, and she is overweight but  otherwise has had no previous abdominal surgery, and I think would be an  adequate candidate for laparoscopic cholecystectomy, if she is cleared  for cardiology.  She had an MI and had cardiac bypass surgery  approximately 2 years ago, but does not give any history of angina or  acute symptoms at this time.  The cardiologist did see her, thought she  was at low risk, checked a lot of cardiac enzymes which were  unremarkable, and we will plan on adding  her to the OR schedule for  laparoscopic cholecystectomy with cholangiogram on the a.m. of the 6th.  The patient has been on Rocephin, and has been on Lovenox.  I am going  to hold the Lovenox ordered for tomorrow morning, but we will resume it  after surgery, if she is in the hospital any significant length of time.  The patient is in agreement with this plan.           ______________________________  Anselm Pancoast. Zachery Dakins, M.D.     WJW/MEDQ  D:  08/23/2006  T:  08/23/2006  Job:  [id number]   cc:   Patient Chart   Anselm Pancoast. Zachery Dakins, M.D.  [address], [address]  [place]  [place] [address]

## 2010-07-02 NOTE — H&P (Signed)
[name], [name]                 ACCOUNT NO.:  [id number]   MEDICAL RECORD NO.:  [id number]          PATIENT TYPE:  INP   LOCATION:  1409                         FACILITY:  [facility]   PHYSICIAN:  Herbie Saxon, MDDATE OF BIRTH:  August 28, 1940   DATE OF ADMISSION:  08/20/2006  DATE OF DISCHARGE:                              HISTORY & PHYSICAL   PRIMARY CARE PHYSICIAN:  Dr. Clyda Greener   GASTROENTEROLOGIST:  Barbette Hair. Arlyce Dice, M.D., Clementeen Graham   CARDIOLOGIST:  Madolyn Frieze. Jens Som, M.D., F.A.C.C., [place]   HISTORY OF PRESENT ILLNESS:  This 70 year old African-American lady woke  up at 4 a.m. this morning with sudden onset right upper quadrant and  right flank abdominal pain, which was severe, 10/10, sharp,  nonradiating, associated with nausea, two episodes of vomiting bilious  material.  Denies any hematemesis or melanotic stool, no generalized  abdominal distention, no jaundice, no chest pain or shortness of breath.  She has not experienced any dysuria, but she has been having polyuria,  secondary to use of diuretics.  She denies any high-grade fever, no skin  rash or joint swelling.  Patient was sent for CT abdomen from the  emergency room.  Subsequently, she developed an anaphylactic reaction  with wheezing, shortness of breath and throat swelling, presumably from  the IV contrast dye.  This was a little ameliorated after Benadryl,  Pepcid and IV Solu-Medrol administered in the emergency room.  She  complains of intermittent reflux symptoms.   PAST MEDICAL HISTORY:  Colitis, hypertension, kidney stone, myocardial  infarction and GI bleed.   Note, the patient had a hysterectomy 40 years ago, colonoscopy in 2006.  She was taken off Plavix after the colonoscopy, as the physician, Dr.  Arlyce Dice, discovered numerous polyps.  Patient at that time was having  melena and she states she has been taken off her Plavix.  Hyperlipidemia.   FAMILY HISTORY:  Brother had Crohn's disease.  Another  brother had an  undefined colitis.  Mother deceased with cancer of the stomach.  Father  deceased, CVA.  Another sister had brain tumor.   SOCIAL HISTORY:  She quit heavy alcohol use about one year ago.  She  quit tobacco smoking about one and a half years ago.  No history of drug  abuse.   DRUG ALLERGIES:  IV CONTRAST DYE, ASPIRIN   MEDICATIONS:  1. Crestor 20 mg daily.  2. Diovan 320 mg daily.  3. HCTZ 12.5 mg daily.  4. Mesalamine 0.2 mg daily.  5. Metoprolol 25 mg daily.  6. Prednisone 10 mg daily.   Note that she is presently off the prednisone and Plavix.   REVIEW OF SYSTEMS:  Pertinent positives as above.  Twelve systems  reviewed.  Negative findings.   EXAMINATION:  She is an elderly lady.  Temperature 99.8, pulse is 78, respiratory rate is 20.  Blood pressure  is presently 105/54.  At presentation it was 151/92.  She has mild respiratory distress.  Pupils equal and reactive to light. Extraocular muscles are intact.  The  neck is supple.  Oropharynx and pharynx are clear.  No submandibular  lymphadenopathy.  There is no clubbing or cyanosis.  CHEST:  She has bilateral rhonchi and reduced breath sounds.  No breast  masses.  Heart sounds 1 and 2.  She has mild right upper quadrant tenderness, no guarding, no rebound.  Peripheral pulses present, no pedal edema.  SKIN:  No skin rash.  She is alert and oriented times three.  Cranial nerves I through XII  intact.  Power is 5 in all limbs.   LABORATORY DATA:  WBC is 8, hematocrit 37, platelet count is 273.  Glucose is 157, sodium 133, potassium 3.4, chloride 101, bicarbonate 24,  BUN 25, creatinine 0.9, AST is 22.  Urinalysis is negative.   The CT abdomen only shows calcified gallstones.   ASSESSMENT:  1. Cholelithiasis.  2. Acute on chronic colitis.  3. New-onset diabetes, which is uncontrolled.  4. Hypokalemia.  5. Hyponatremia.  6. Mild dehydration.  7. Anaphylactic reaction to IV contrast dye.   The patient  will be admitted to a medical telemetry bed.  We will  continue her on IV Solu-Medrol 80 mg q. 8 hours, Benadryl 25 mg IV q. 8  hours.  She will be on bronchodilators, Atrovent and Proventil q. 2  hours p.r.n. for  wheezing.  Supplement with KCL 40 mEq IV over the next four hours.  Dilaudid 2 mg IV q. 6 hours p.r.n. for abdominal pain.  Check complement  levels and ESR.  Send a hemoglobin A1c, TSH, cardiac BNP and check her  coagulation parameters.  Urinalysis will also be checked.      Herbie Saxon, MD  Electronically Signed     MIO/MEDQ  D:  08/20/2006  T:  08/21/2006  Job:  [id number]   cc:   Madolyn Frieze. Jens Som, MD, [facility]  [address]  [address]  [place]  [place] [address]   Barbette Hair. Arlyce Dice, MD,FACG  [address]  [place]  [place] [address]   [place] Dr. Bruna Potter

## 2010-07-02 NOTE — Discharge Summary (Signed)
NAMECLOTEE, SCHLICKER                 ACCOUNT NO.:  [id number]   MEDICAL RECORD NO.:  [id number]          PATIENT TYPE:  INP   LOCATION:  1409                         FACILITY:  [facility]   PHYSICIAN:  Elliot Cousin, M.D.    DATE OF BIRTH:  1940/08/28   DATE OF ADMISSION:  08/20/2006  DATE OF DISCHARGE:  08/27/2006                               DISCHARGE SUMMARY   DISCHARGE DIAGNOSES:  1. Chronic cholecystitis with cholelithiasis, status post laparoscopic      cholecystectomy and negative cholangiogram on August 23, 2006, per Dr.      Zachery Dakins.  2. Ileus secondary to number one, resolved during the hospital course.  3. Anaphylactic reaction to intravenous contrast, treated with      epinephrine, Solu-Medrol, and Benadryl.  4. Diet-controlled diabetes mellitus.  The patient's hemoglobin A1c is      7.3.  Her capillary blood glucose prior to hospital discharge      ranged from 78 to 130.  5. Hypertension and coronary artery disease with a history of      myocardial infarction in 1998 and 2006, status post CABG in 2006.      The patient has an ejection fraction ranging from 55-60% per 2D      echocardiogram performed during this hospitalization on August 24, 2006.  6. Low TSH but normal free T4.   SECONDARY DISCHARGE DIAGNOSES:  1. Ulcerative colitis.  2. Hyperlipidemia.  3. Left carotid artery stenosis estimated to be 40-60%.  4. Obesity.  5. Former alcohol and tobacco user.  6. Degenerative joint disease of the knees.  7. Status post left total knee replacement.  8. Status post hysterectomy.   DISCHARGE MEDICATIONS:  1. Darvocet-N 100, one tablet every 4 hours as needed for pain.  2. Crestor 40 mg daily.  3. Metoprolol 50 mg b.i.d.  4. Diovan 160 mg daily.  5. Lialda 1.2 grams daily.  6. DO NOT TAKE PLAVIX AND HYDROCHLOROTHIAZIDE UNTIL YOU DISCUSS      FURTHER WITH DR. CRENSHAW.   DISCHARGE DISPOSITION:  The patient was discharged to home in improved  and stable condition on  August 27, 2006.  She was advised to follow up  with Dr. Zachery Dakins in 2 weeks and Dr. Bruna Potter in one week.  She was also  advised to follow up with her cardiologist Dr. Jens Som in 1-2 weeks.   CONSULTATIONS:  1. Anselm Pancoast. Zachery Dakins, M.D.  2. Doylene Canning. Ladona Ridgel, M.D.   PROCEDURES PERFORMED:  1. CT scan of the abdomen and pelvis on August 20, 2006.  The results      revealed a large densely calcified gallstone, heavy vascular      calcifications, and a nonobstructing small left renal calculus.      The CT of the pelvis revealed no acute or significant findings in      the pelvis.  No inflammatory bowel disease or other active      abnormalities identified.  2. A 2D echocardiogram performed on August 24, 2006.  The results      revealed an ejection  fraction ranging from 55-60%, left ventricular      wall thickness was mildly to moderately increased, aortic valve      thickness was mildly increased, peak pulmonary artery systolic      pressure was mildly increased and mild aortic valvular      regurgitation.  3. Status post laparoscopic cholecystectomy with cholangiogram on August 23, 2006, per Dr. Zachery Dakins.   HISTORY OF PRESENT ILLNESS:  The patient is a 70 year old woman with a  past medical history significant for ulcerative colitis and coronary  artery disease, who presented to the emergency department on August 20, 2006, with a chief complaint of right upper quadrant abdominal pain and  right flank pain.  The emergency department physician ordered a CT scan  of the abdomen and pelvis.  The CT scan was positive for a large  gallstone.  The patient developed an anaphylactic reaction which was  presumed to be secondary to the IV contrast.  She experienced wheezing,  shortness of breath, and throat swelling.  She was immediately treated  with epinephrine, Benadryl, Pepcid, and Solu-Medrol.  She improved.  She  was subsequently admitted for further evaluation and management.  For  additional  details please see the dictated history and physical.   HOSPITAL COURSE:  1. CHRONIC CHOLECYSTITIS WITH CHOLELITHIASIS, STATUS POST LAPAROSCOPIC      CHOLECYSTECTOMY.  The patient was hemodynamically stable and      afebrile at the time of the hospital admission.  Her white blood      cell count was also within normal limits.  Her liver transaminases      were within normal limits as well.  An urinalysis was ordered and      revealed 0-2 WBCs.  The patient was started on IV fluids and a      clear liquid diet.  Symptomatic treatment was started with      intravenous Dilaudid as needed and prophylactic Protonix.  Given      the findings of the CT scan, general surgeon - Dr. Zachery Dakins was      consulted.  Per his assessment, the patient would need a      laparoscopic cholecystectomy with a cholangiogram.  However given      her cardiac history, he requested clearance from the patient's      cardiologist.  The cardiology consultation was provided by Dr.      Lewayne Bunting.  Per Dr. Lubertha Basque assessment, the patient's overall      cardiovascular risk of surgery was low, not zero.  He felt that the      patient did not have any evidence of active heart failure or active      angina per exam.  He obtained cardiac enzymes and if they were      negative, he recommended proceeding with the surgery.  Cardiac      enzymes were ordered and were completely negative.  The patient      subsequently underwent the laparoscopic cholecystectomy on August 23, 2006.  A cholangiogram was also performed and was negative.  The      patient tolerated the procedure well.  Several days following the      operation, the patient developed an ileus.  The ileus quickly      resolved and the patient's diet was advanced progressively.  She      did have some abdominal discomfort prior to hospital discharge,  however, it was significantly less than it was a few days prior.  2. ANAPHYLACTIC REACTION TO IV CONTRAST.   As indicated above, the      patient developed wheezing, shortness of breath, and throat      swelling in the emergency department.  She was appropriately      treated with epinephrine, Solu-Medrol, Benadryl, and Pepcid.  Her      symptoms subsided prior to her leaving the emergency department.      She was continued on treatment with Benadryl and Solu-Medrol for      several days.  All of her symptoms with regards to the anaphylactic      reaction completely resolved.  3. DIET CONTROLLED TYPE 2 DIABETES MELLITUS.  The patient's capillary      blood glucose was found to be mildly to moderately elevated during      the hospital course.  Hemoglobin A1c was ordered and it was 7.3.      She was started on a carbohydrate modified diet.  The registered      dietician and the nursing staff provided the patient with      information regarding a carbohydrate modified diet.  She was      treated with Lantus and sliding scale insulin during the hospital      course.  However, the Lantus was discontinued as the patient's      capillary glucose fell within the 80-130 range.  It was decided not      to discharge the patient home on an oral hypoglycemic agent as her      appetite had been marginal and hypoglycemia was a concern.  For      this reason, the patient was counseled on a carbohydrate modified      diet and weight loss.  Prior to hospital discharge, her morning      capillary blood glucose was 78.  4. HYPERTENSION AND CORONARY ARTERY DISEASE.  The patient was started      on intravenous Lopressor perioperatively by the [facility]      cardiologists that followed the patient throughout the      hospitalization.  They made adjustments in her antihypertensive      medications once she began taking oral medications .  The      hydrochlorothiazide was discontinued and the metoprolol was      titrated up to 50 mg b.i.d.  The Diovan was continued at 160 mg      daily prior to hospital discharge.  As  indicated above, her cardiac      enzymes were negative.  A 2D echocardiogram was ordered to assess      her LV function.  The echo revealed that her systolic function was      within normal limits with an ejection fraction ranging from 55-60%.  5. LOW TSH AND NORMAL FREE T4.  The patient's TSH was assessed during      the hospitalization.  Her TSH was found to be quite low at 0.168.      Given this finding, free T3 and free T4 levels were ordered.  The      free T4 was within normal limits at 0.90, and the free T3 was      marginally low at 2.1 (2.3 to 4.2 within normal limits).  Tapazole      had been started based on the TSH, however, once it was realized      that the  free T4 was within normal limits, the Tapazole was      discontinued.      Elliot Cousin, M.D.  Electronically Signed     DF/MEDQ  D:  09/18/2006  T:  09/18/2006  Job:  [id number]   cc:   Anselm Pancoast. Zachery Dakins, M.D.  [address], [address]  [place]  [place] [address]   Barbette Hair. Arlyce Dice, MD,FACG  [address]  [place]  [place] [address]   Madolyn Frieze. Jens Som, MD, [facility]  [address]  [address]  [place]  [place] [address]

## 2010-07-02 NOTE — Procedures (Signed)
CAROTID DUPLEX EXAM   INDICATION:  Follow up carotid artery disease.   HISTORY:  Diabetes:  No.  Cardiac:  MI in 2003.  Hypertension:  Yes.  Smoking:  Yes.  Previous Surgery:  No.  CV History:  No.  Amaurosis Fugax No, Paresthesias No, Hemiparesis No.                                       RIGHT             LEFT  Brachial systolic pressure:         100               100  Brachial Doppler waveforms:         WNL               WNL  Vertebral direction of flow:        Antegrade         Antegrade  DUPLEX VELOCITIES (cm/sec)  CCA peak systolic                   90                98  ECA peak systolic                   87                76  ICA peak systolic                   92                110  ICA end diastolic                   41                40  PLAQUE MORPHOLOGY:                  Calcific          Calcific  PLAQUE AMOUNT:                      Mild              Mild  PLAQUE LOCATION:                    ICA, bulb         ICA, bulb   IMPRESSION:  1. The right internal carotid artery suggests 20-39% stenosis.  2. The left internal carotid artery suggests 40-59% stenosis.      Previous CT showed 60% stenosis.  3. Antegrade flow in bilateral vertebrals.   ___________________________________________  Di Kindle. Edilia Bo, M.D.   CB/MEDQ  D:  04/17/2009  T:  04/18/2009  Job:  [id number]

## 2010-07-02 NOTE — Assessment & Plan Note (Signed)
[facility]                            CARDIOLOGY OFFICE NOTE   NAME:Avetisyan, SHIRIN ECHEVERRY                        MRN:          [id number]  DATE:10/01/2006                            DOB:          [birth year]    Ms. Cizek is a very pleasant female who has a history of coronary disease  status post coronary bypass and graft.  Since I last saw her she was  admitted to [facility] and had her gallbladder removed.  Note  an echocardiogram during that admission showed normal LV function, mild  aortic insufficiency.  Also note she had an anaphylactic reaction to IV  contrast.  She was also noted to have a low TSH but a normal T4.  Note  her most recent Myoview was performed in February.  This showed breast  attenuation but no scars, ischemia.  The ejection fraction was 77%.  Since that time she has pain in her chest, but it is when she moves in a  certain way.  This has been a chronic issue.  She does not have  exertional chest pain.  There is no dyspnea, but she has had mild  increased pedal edema.   MEDICATIONS:  1. Lopressor 25 mg p.o. b.i.d.  2. Diovan 320 mg p.o. daily.  3. Lialda 4.8 daily.  4. Crestor 40 mg p.o. daily.   PHYSICAL EXAMINATION:  VITAL SIGNS:  Today shows blood pressure 134/86,  pulse 74, weighs 252 pounds.  HEENT:  Normal.  NECK:  Supple with no bruits.  CHEST:  Clear.  CARDIOVASCULAR:  Regular rate and rhythm.  ABDOMEN:  Benign.  EXTREMITIES:  Show trace edema.   Her electrocardiogram shows a sinus rhythm at a rate of 69.  There are  no ST changes noted.   DIAGNOSES:  1. Coronary artery disease status post coronary bypass and graft - her      most recent Myoview is low risk and we will continue with medical      therapy.  I will continue with her Crestor, Lopressor, and ARB.  I      have also asked her to add enteric-coated aspirin 81 mg p.o. daily.      I know she has not taken aspirin in the past as she states it  caused rectal bleeding.  We will discontinue if this recurs.  2. History of mild cerebrovascular disease - she will need followup      carotid Dopplers in February 2010.  3. Hypertension - her blood pressure is reasonably well-controlled.      She is complaining of mild edema, and we will schedule to resume      her hydrochlorothiazide at 12.5 mg p.o. daily and we will check a      BMET in one week.  I will also check a TSH at that time as her TSH      was low in the hospital.  4. Hyperlipidemia - we will continue on the Crestor.  5. History of ulcerative colitis.   We will see her back in nine months.  Madolyn Frieze Jens Som, MD, [facility]  Electronically Signed    BSC/MedQ  DD: 10/01/2006  DT: 10/02/2006  Job #: [id number]   cc:   Bruna Potter, M.D.

## 2010-07-02 NOTE — Assessment & Plan Note (Signed)
[facility]                            CARDIOLOGY OFFICE NOTE   NAME:Moch, FELINA TELLO                        MRN:          [id number]  DATE:07/07/2007                            DOB:          04/02/1940    Mrs. Hanselman is a very pleasant female that I have seen in the past for  coronary artery disease.  She has had prior coronary artery bypass  grafting in 2006.  Her LV function is normal.  Her most recent Myoview  was performed on April 17, 2006.  There was no scar or ischemia and  her ejection fraction was 77%.  There was mild breast attenuation.  Since I last saw her, she denies any dyspnea, chest pain, palpitations  or syncope.  There is no pedal edema.  Note, she continues to smoke  three cigarettes per day.  She did not have her thyroid checked as we  asked her previously.   PRESENT MEDICATIONS:  1. Metoprolol 25 mg p.o. b.i.d.  2. Diovan 320 mg p.o. daily.  3. Lialda 4.8 grams p.o. daily.  4. Crestor 40 mg p.o. daily.  5. Aspirin 81 mg p.o. daily.   PHYSICAL EXAMINATION:  VITAL SIGNS:  Her physical exam today shows a  blood pressure of 114/71 and pulse is 66.  She weighs 234 pounds.  HEENT:  Normal.  NECK:  Supple.  No bruits noted.  CHEST:  Clear.  CARDIOVASCULAR:  Regular rhythm.  ABDOMEN:  No tenderness.  I cannot appreciate bruits or pulsatile  masses.  EXTREMITIES:  No edema.   Electrocardiogram shows sinus rhythm with first-degree AV block.  There  are no significant ST changes.   DIAGNOSES:  1. Coronary artery disease status post coronary artery bypass grafting      - Mrs. Michelotti is doing well from a symptomatic standpoint with no      chest pain or shortness of breath.  We will continue with medical      therapy including her aspirin, statin, beta blocker and ARB.  2. Tobacco abuse - we discussed the importance of discontinuing this      for between 3-10 minutes.  3. History of mild cerebrovascular disease - she will need  follow-up      carotid Dopplers in February 2010.  4. Hypertension - blood pressure is adequately controlled on present      medications.  We will check a BMET to follow potassium and renal      function.  5. Hyperlipidemia - she will continue on Crestor and we will check      lipids and liver and adjust as indicated.  6. History of ulcerative colitis.  7. Decreased TSH - we asked her to follow up with her primary care      physician concerning this previously, but she did not.  We again      encouraged her to do this and provided a note for her to take to      Dr. Daneil Dan office.   I will see her back in 1 year.     Madolyn Frieze  Jens Som, MD, [facility]  Electronically Signed    BSC/MedQ  DD: 07/07/2007  DT: 07/07/2007  Job #: [id number]   cc:   Clyda Greener, M.D.

## 2010-07-02 NOTE — Consult Note (Signed)
VASCULAR SURGERY CONSULTATION   [name], [name]  DOB:  06-07-1940                                       10/05/2008  ZOXWR#:[id number]   REFERRING PHYSICIAN:  Clyda Greener, MD.   REFERRAL DIAGNOSIS:  Extracranial cerebrovascular occlusive disease.   HISTORY:  The patient is a 70 year old female referred by Dr. Bruna Potter for  evaluation of her carotid and vertebral atherosclerotic disease.  She  has undergone a CT angiogram of the neck revealing extensive  atherosclerotic plaque of the carotid bifurcations bilaterally.  Right  internal carotid artery reveals no significant stenosis.  Left ICA  reveals a 60% stenosis.  Vertebral arteries are noted to have antegrade  flow with 50% proximal stenoses bilaterally.   The patient denies a history of stroke.  No sensory motor or visual  deficit.  Denies visual disturbance.  No gait abnormality.  No syncopal  episodes.  No visual disturbance.   PAST MEDICAL HISTORY:  1. Hypertension.  2. Hyperlipidemia.  3. Coronary artery disease status post coronary artery bypass.  4. Nodular thyroid disease.  5. Ulcerative colitis.   MEDICATIONS:  1. Diovan 320 mg daily.  2. Crestor 40 mg daily.  3. Hydrochlorothiazide 12.5 mg daily.  4. Metoprolol 25 mg daily.  5. Methimazole 5 mg daily.  6. __________1.2 daily.   ALLERGIES:  IVP dye and aspirin.   FAMILY HISTORY:  Mother deceased age 26 of natural causes.  Father  deceased age 60 of a stroke.   SOCIAL HISTORY:  The patient is married with three children.  She is a  retired Games developer.  Continues to smoke about a quarter of a pack of  cigarettes daily.  No alcohol use.   REVIEW OF SYSTEMS:  Refer to patient encounter form, this was reviewed  today.  The patient notes occasional swallowing difficulty.  Arthritic  discomfort.  Feelings of anxiety and nervousness.   PHYSICAL EXAM:  General:  Well-appearing 70 year old female.  Alert and  oriented.  No distress.  Vital signs:   BP is 105/69 both arms, pulse is  69, respirations 18 per minute.  HEENT:  Mouth and throat are clear.  Normocephalic.  Extraocular movements intact.  Neck:  Supple.  No  thyromegaly or adenopathy.  Chest:  Equal air entry bilaterally.  No  rales or rhonchi.  Cardiovascular:  No carotid bruits.  Normal heart  sounds without murmurs.  Regular rate and rhythm.  No gallops or rubs.  Abdomen:  Soft, nontender.  No masses or organomegaly.  Normal bowel  sounds without bruits.  Neurological:  Cranial nerves intact.  Strength  equal bilaterally.  1+ reflexes.  Intact gait.  Normal speech.  Extremities:  Full range of motion without joint deformity.   IMPRESSION:  1. 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  Remains asymptomatic.  2. Hypertension.  3. Hyperlipidemia.  4. Coronary artery disease.   RECOMMENDATIONS:  The patient is unable to tolerate aspirin so  antiplatelet therapy is not a viable option.  Continue conservative  observation with followup carotid Doppler in 6 months.   Balinda Quails, M.D.  Electronically Signed  PGH/MEDQ  D:  10/05/2008  T:  10/06/2008  Job:  [id number]   cc:   Clyda Greener, MD

## 2010-07-02 NOTE — Consult Note (Signed)
[name], [name]                 ACCOUNT NO.:  [id number]   MEDICAL RECORD NO.:  [id number]          PATIENT TYPE:  INP   LOCATION:  1409                         FACILITY:  [facility]   PHYSICIAN:  Doylene Canning. Ladona Ridgel, MD    DATE OF BIRTH:  June 02, 1940   DATE OF CONSULTATION:  08/22/2006  DATE OF DISCHARGE:                                 CONSULTATION   REFERRING PHYSICIAN:  Anselm Pancoast. Zachery Dakins, M.D.   REASON FOR CONSULTATION:  Evaluation of patient prior to cholecystectomy  for possible surgical risk.   HISTORY OF PRESENT ILLNESS:  The patient is a very pleasant 70 year old  woman with a history of right upper quadrant abdominal pain who is  admitted to hospital and found to have gallstones.  She is now referred  for surgical evaluation and has been requested to undergo a  cholecystectomy.  The patient is now referred for preop evaluation.   Her past medical history is notable for coronary artery disease status  post MI in 2006 and status post bypass surgery at that time.  Her LV  function is presently not known but thought in the past to be preserved.  The patient denies chest pain or shortness of breath.  Her activity is  relatively sedentary but not totally so in that she is able to get  around.  She goes and does her own shopping. She is able to go to the  grocery store.  She is able to clean her house without particular  problem.  She has never had syncope.  She denies difficulty with sleep.  Additional past medical history is notable for renal stones and a  history of hypertension.   FAMILY HISTORY:  Is notable for brother with Crohn's disease, another  brother with colitis.  Her mother had cancer of the stomach.  Her father  died of stroke.   SOCIAL HISTORY:  The patient has remote heavy alcohol use history in the  past but quit drinking alcohol 1 year ago.  She quit smoking cigarettes  just prior to this.  There is no history of IV drug abuse by her or  other recreational  drug use by her report.   MEDICATIONS:  Include Crestor, Diovan, hydrochlorothiazide, methylamine,  metoprolol and prednisone.   REVIEW OF SYSTEMS:  Is notable for abdominal pain and discomfort.  She  has very minimal dyspnea with exertion.  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  She has a history of polyuria in the  past, none recently. There is a history of diarrhea and abdominal pain.  The rest of the Review of Systems was negative except as noted above.  She has never had syncope.  She denied symptomatic palpitations.   PHYSICAL EXAMINATION:  GENERAL:  She is a pleasant, well-appearing 69-  year-old woman in no acute distress.  VITAL SIGNS:  Blood pressure was 130/80. The pulse was 89 and regular.  Respirations were 18.  HEENT:  Normocephalic, atraumatic.  Pupils equal, round.  Oropharynx  moist.  Sclerae anicteric.  NECK:  Revealed no jugular distension.  There  are no obvious bruits.  There is no thyromegaly.  Trachea is midline. Carotids 2+ and symmetric.  LUNGS:  Clear bilaterally to auscultation.  There are no wheezes, rales  or rhonchi.  There is no increased work of breathing.  CARDIOVASCULAR:  Exam revealed a regular rate and rhythm with normal S1  and S2.  There is a soft S4 gallop present.  The PMI was not enlarged  nor was it laterally displaced.  ABDOMEN:  Soft, nontender distended.  No organomegaly.  Bowel sounds  present.  No rebound or guarding.  EXTREMITIES:  Demonstrate no cyanosis, clubbing or edema.  Pulses were  2+ and symmetric.  NEUROLOGIC:  Alert and oriented x3 with cranial nerves intact.  The  strength was 5/5 and symmetric.   The EKG demonstrates sinus rhythm with left atrial enlargement. There  are no acute ST-T wave changes.   IMPRESSION:  1. Ischemic heart disease status post remote myocardial infarction,      status post bypass surgery, status post recent (less than 6 months      ago) stress test.  2. Hypertension.  3.  Cholelithiasis for possible cholecystectomy.   DISCUSSION:  The patient's overall cardiovascular risk of surgery is low  but not zero.  While she is fairly sedentary, she is minimally  symptomatic at the present time.  The patient does not have any evidence  of active heart failure on exam, and she is not having any active  angina.  I plan to obtain cardiac enzymes both before surgery as well as  after surgery.  Based on all of the above, I will allow her to proceed  with surgery, though we would recommend minimizing any large volume  resuscitation as possible.  If her blood pressure becomes elevated, then  adding a beta blocker would certainly be warranted as well.  Careful  control of her blood sugar around the time of surgery should also be  strongly considered.      Doylene Canning. Ladona Ridgel, MD  Electronically Signed     GWT/MEDQ  D:  08/22/2006  T:  08/23/2006  Job:  [id number]   cc:   Madolyn Frieze. Jens Som, MD, [facility]  [address]  [address]  [place]  [place] [address]   Clyda Greener, MD   Barbette Hair. Arlyce Dice, MD,FACG  [address]  [place]  [place] [address]

## 2010-07-02 NOTE — Op Note (Signed)
[name]                 ACCOUNT NO.:  [id number]   MEDICAL RECORD NO.:  [id number]          PATIENT TYPE:  INP   LOCATION:  1409                         FACILITY:  [facility]   PHYSICIAN:  Anselm Pancoast. Weatherly, M.D.DATE OF BIRTH:  Jul 10, 1940   DATE OF PROCEDURE:  08/23/2006  DATE OF DISCHARGE:                               OPERATIVE REPORT   PREOPERATIVE DIAGNOSIS:  Chronic cholecystitis with stones.   POSTOPERATIVE DIAGNOSIS:  Chronic cholecystitis with stones.   OPERATION:  Laparoscopic cholecystectomy with cholangiogram.   ANESTHESIA:  General anesthesia.   HISTORY:  [name] is a 70 year old overweight diabetic who was  admitted by the [facility] team on August 20, 2006, after presenting to  the emergency room with epigastric lower right-sided chest pain.  She,  in the emergency room, was evaluated by the ER physicians and had a CAT  scan, after which she developed a wheezing, shortness of breath and some  throat swelling, supposedly from the IV contrast and she was given  Benadryl and then admitted and put on Solu-Medrol.  She was evaluated.  The CT showed a stone in the gallbladder.  This stone had been noted in  February 2001, when she was seen in the emergency room and in the  interim she has had an MI, she has had coronary artery bypass surgery  and still of course is being managed for her elevated sugar.  She is  still extremely heavy and her shortness of breath subsided.  I was call  yesterday by Dr. Geraldo Pitter, who wanted [place] to proceed on with a  cholecystectomy since it was thought that the pain was due to a  gallbladder attack.  We had her cardiologist see her and they did a lot  of cardiac enzymes, all of which were normal, cleared her and she is  added to the OR schedule this morning.  She has been on Rocephin, her  next dose is at 2:00 this afternoon.  I gave her 3 grams of Unasyn.  She  has been on Lovenox which we held this morning and the patient was  induced with general anesthesia, endotracheal tube, oral tube into the  stomach and then the abdomen was prepped with Betadine surgical scrub  solution and draped in sterile manner.  She already has a Foley catheter  which was left in place and has got PAS stockings.  We draped in sterile  manner and a small incision was made just above the umbilicus, she is so  heavy and the appendiceal retractors were used to help [place] identify the  fascia and then a small opening made.  The underlying peritoneum was  identified and poked through with a Tresa Endo very carefully.  A pursestring  of 0 Vicryl was placed and Hasson cannula introduced.  The gallbladder  was encased in adhesions, mostly these are more chronic than acute and  the upper 10 mm trocar was placed in the subxiphoid area and the two  lateral 5 mm trocars placed.  Dr. Johna Sheriff had scrubbed in and assisted  [place] in elevating the gallbladder, removing the  adhesions from the omentum  that were adherent and then we could go down and identify the cystic  duct and this was clipped flush with the gallbladder with a clip.  Small  opening made, Cook catheter introduced and then using Omnipaque, the  cholangiogram was obtained about 3 mL of fluid.  The external and  intrahepatic radicles visualized nicely, good flow into the duodenum.  The catheter was removed.  The cystic duct was triply clipped and  divided.  The cystic artery was identified, doubly clipped proximally,  singly distally, divided and then the gallbladder was freed from its bed  with a hook and then spatula electrocautery.  The gallbladder was placed  in an EndoCatch bag.  Inspection of where the adhesions in the omentum  had been taken down revealed no evidence of any bleeding and the camera  switched to the upper 10 mm port.  The gallbladder within the bag was  removed through the little fascial defect.  I put a figure-of-eight of 0  Vicryl in addition to the pursestring, tied them  both and then  anesthetized the fascia at just above the umbilicus.  The subcutaneous  wounds were closed with 4-0 Vicryl after the trocars had been removed.  Carbon dioxide released.  Benzoin and Steri-Strips were placed on the  skin.  The patient tolerated the procedure  nicely.  Should be able to resume her chronic medications.  We  will  plan on getting her Foley out this afternoon and hopefully she can be  discharged tomorrow.  We will give her the one additional dose of  Rocephin that she is receiving this afternoon and should be able to  start a diet and resume her medicines for diabetes.           ______________________________  Anselm Pancoast. Zachery Dakins, M.D.     WJW/MEDQ  D:  08/23/2006  T:  08/23/2006  Job:  [id number]

## 2010-07-05 NOTE — Assessment & Plan Note (Signed)
[facility]                         GASTROENTEROLOGY OFFICE NOTE   NAME:[name], [name]                        MRN:          [id number]  DATE:03/16/2006                            DOB:          [birth year]    PROBLEM:  Diarrhea.   [name] has returned again complaining of worsening diarrhea.  She has  had rectal bleeding, diarrhea, and urgency.  She has a history of  pancolitis and is maintained on Lialda 4.8 g a day.  She also complains  of dizziness.   EXAM:  Pulse 75.  Blood pressure 120/80.  Weight 251.   IMPRESSION:  Flare up ulcerative colitis.   RECOMMENDATIONS:  1. Continue Lialda.  2. Begin prednisone 20 mg daily.  If she is not improved in 3 days, I      will increase that to 40 mg.  3. Check a CBC.     Barbette Hair. Arlyce Dice, MD,FACG  Electronically Signed    RDK/MedQ  DD: 03/16/2006  DT: 03/16/2006  Job #: [id number]

## 2010-07-05 NOTE — Assessment & Plan Note (Signed)
[facility]                            CARDIOLOGY OFFICE NOTE   NAME:[name], [name]                        MRN:          [id number]  DATE:04/02/2006                            DOB:          Jul 13, 1940    [name] returns for followup today.  She is an extremely pleasant  patient who has a history of coronary artery disease status post  coronary artery bypass grafting.  Since I last saw her she occasionally  has pain in her chest, but this is predominantly with certain arm  movements.  She continues to have dyspnea on exertion.  There is no  orthopnea, PND, or pedal edema.  There has been no syncope.   MEDICATIONS:  1. Crestor 20 mg p.o. daily.  2. Metoprolol 25 mg p.o. b.i.d.  3. Diovan 320 mg p.o. daily.  4. Prednisone 30 mg p.o. daily.  5. Hydrochlorothiazide 12.5 mg p.o. daily.  6. She also takes Lialda.   PHYSICAL EXAM:  Blood pressure 120/72, and her pulse is 58.  NECK:  Supple.  CHEST:  Clear.  CARDIOVASCULAR:  Regular rate and rhythm.  EXTREMITIES:  No edema.   Her electrocardiogram shows a sinus rhythm at a rate of 68.  There were  no ST changes noted.   DIAGNOSES:  1. Coronary artery disease status post coronary artery bypass      grafting.  2. History of mild cerebrovascular disease.  3. Hypertension.  4. Hyperlipidemia.  5. History of ulcerative colitis.   PLAN:  [name] is doing well from a symptomatic standpoint.  She has  some vague chest pain that sounds musculoskeletal.  We will plan to risk  stratify with an adenosine Myoview.  If it shows normal perfusion then  we will continue with medical therapy.  Her blood pressure is much  improved today.  I will check a BMET, liver functions, TSH, and lipids,  and adjust her regimen as indicated.  She needs followup carotid  Dopplers given her history of mild cerebrovascular disease.  She will  otherwise continue with risk factor modification.  I will see her back  in 6  months.    Madolyn Frieze Jens Som, MD, [facility]  Electronically Signed   BSC/MedQ  DD: 04/02/2006  DT: 04/02/2006  Job #: [id number]

## 2010-07-05 NOTE — Assessment & Plan Note (Signed)
[facility]                              CARDIOLOGY OFFICE NOTE   NAME:Crissman, TRACIA LACOMB                        MRN:          [id number]  DATE:09/30/2005                            DOB:          04-16-1940    Ms. Worthington is a very pleasant female who has a history of coronary disease  status post coronary bypass and graft.  Her most recent nuclear study was in  March of 2007.  She was found to have breast attenuation versus a small  prior interior infarct.  There was no ischemia.  Her ejection fraction was  75%.  She had no significant disease in her carotid Doppler and follow up  was recommended in one year.  Since I last saw her she continues to have an  occasional pain in her chest when she lays a certain way but there is no  exertional chest pain.  There is mild dyspnea on exertion but there is no  orthopnea, PND, pedal edema or syncope.   MEDICATIONS:  Include:  1. Crestor 20 mg p.o. daily.  2. Metoprolol 25 mg p.o. b.i.d.  3. Plavix 75 mg p.o. daily.  4. Diovan 320 mg p.o. daily.  5. Prednisone.  6. Lialda.   PHYSICAL EXAM:  VITAL SIGNS:  Today, shows a blood pressure of 149/91.  Her  pulse is 84.  NECK:  Supple.  CHEST:  Clear.  CARDIOVASCULAR:  Regular rate and rhythm.  EXTREMITIES:  Show no edema.   Electrocardiogram shows a normal sinus rhythm with a first degree AV block.  There is a prior inferior infarct.   DIAGNOSES:  1. Coronary artery disease status post coronary bypass and graft.  2. Preserved LV function.  3. History of mild cerebrovascular disease.  4. Hypertension.  5. Hyperlipidemia.  6. History of ulcerative colitis.   PLAN:  Ms. Vanbrocklin is doing well from a symptomatic standpoint.  Her blood  pressure is elevated today and I have asked her to begin hydrochlorothiazide  12.5 mg p.o. daily.  We will check a CMED in one week to follow her  potassium and renal function as well as her liver functions.  I will also  check her  lipids with a goal LDL of less than 70.  We discussed the  importance of risk  factor modification today.  We discussed the exercise and diet and she  discontinued her tobacco use back in January of 2006 when she had her  bypass.  I will see her back in 6 months.                              Madolyn Frieze Jens Som, MD, [facility]    BSC/MedQ  DD:  09/30/2005  DT:  10/01/2005  Job #:  [id number]

## 2010-07-05 NOTE — Op Note (Signed)
[name], [name]                 ACCOUNT NO.:  [id number]   MEDICAL RECORD NO.:  [id number]          PATIENT TYPE:  INP   LOCATION:  2308                         FACILITY:  [facility]   PHYSICIAN:  Salvatore Decent. Cornelius Moras, M.D. DATE OF BIRTH:  Apr 05, 1940   DATE OF PROCEDURE:  02/27/2004  DATE OF DISCHARGE:                                 OPERATIVE REPORT   PREOPERATIVE DIAGNOSIS:  Severe three-vessel coronary artery disease with  class IV unstable angina.   POSTOPERATIVE DIAGNOSIS:  Severe three-vessel coronary artery disease with  class IV unstable angina.   PROCEDURE:  Median sternotomy for coronary artery bypass grafting times five  with coronary endarterectomy times one (left internal mammary artery to  distal left anterior descending coronary artery with left anterior  descending coronary artery endarterectomy, saphenous vein graft to first  diagonal branch, saphenous vein graft to second circumflex marginal branch,  saphenous vein graft to distal right coronary artery and sequential  saphenous vein graft to left posterolateral branch, open saphenous vein  harvest from left thigh and left lower leg).   SURGEON:  Dr. Salvatore Decent. Vernie Murders.ASSISTANT:  Dr. Charlett Lango.   SECOND ASSISTANT:  Eber Jones A. Eustaquio Boyden.   ANESTHESIA:  General.   BRIEF CLINICAL NOTE:  The patient is a 70 year old African-American female  from [place] with history of coronary artery disease as well as history  of hypertension, hyperlipidemia and longstanding tobacco use. The patient  presents with symptoms of class IV unstable angina. Cardiac catheterization  demonstrated severe three-vessel coronary artery disease with normal left  ventricular function. A full consultation has been dictated previously.   OPERATIVE CONSENT:  The patient and her family have been counseled at length  regarding the indications and potential benefits of coronary artery bypass  grafting.  Alternative treatment  strategies have been discussed in detail.  They understand and accept all associated risks of surgery including but not  limited to risk of death, stroke, myocardial infarction, congestive heart  failure, respiratory failure, pneumonia, bleeding requiring blood  transfusion, arrhythmia, infection, and recurrent coronary artery disease.  All their questions have been addressed.   OPERATIVE FINDINGS:  1.  Normal left ventricular systolic function.  2.  Mild left ventricular hypertrophy.  3.  Diffuse coronary artery disease with heavily calcified plaque in many of      the patient's epicardial coronary arteries.  4.  Fair-quality targets for grafting at best.  5.  Fair to poor quality saphenous vein conduit.   DESCRIPTION OF PROCEDURE:  The patient brought to the operating room and  above-mentioned date and central monitoring was established by the  anesthesia service under the care and direction of Burna Forts, M.D.  Specifically, a Swan-Ganz catheter was placed through the right internal  jugular approach. A radial arterial line was placed. Intravenous antibiotics  were administered. Following induction with general endotracheal anesthesia,  Foley catheter was placed. The patient's chest, abdomen, both groins, and  both lower extremities were prepared, draped in sterile manner.   Baseline transesophageal echocardiogram was performed by Dr. Jacklynn Bue and  Dr. Ivin Booty.  This demonstrates presence of normal left ventricular function.  There is very mild aortic insufficiency and very mild mitral regurgitation.  No other significant abnormalities were noted. There is some sclerosis of  the aortic valve.   A median sternotomy incision was performed in the left internal mammary  artery was dissected from the chest wall and prepared for bypass grafting.  The left internal mammary artery is good quality conduit. Simultaneously  saphenous vein was obtained from the patient's left thigh and  left lower  leg. Initially attempts were made at endoscopic saphenous vein harvest.  However, at the level of the knee on both legs several small incisions were  made and the saphenous vein at the level of the knee was quite small and  relatively poor quality. After an exhaustive search for appropriate conduit  at the knee, the endoscopic vein harvest was aborted. Subsequently a segment  of saphenous vein was obtained the patient's left lower leg using open  harvest technique. An additional segment of saphenous vein was obtained from  the patient's left thigh using open harvest technique. The saphenous vein  which was ultimately utilized was fair quality conduit.  It is slightly  small caliber and thin-walled. There are no areas of varicosities. After the  saphenous vein was removed, all the surgical incisions and both lower  extremities were irrigated with saline solution and subsequently closed in  multiple layers with running absorbable suture.   The patient was heparinized systemically. The pericardium was opened. The  ascending aorta was moderately dilated. It was free of any palpable plaques  or calcifications. The ascending aorta and the right atrium were cannulated  for cardiopulmonary bypass. Adequate heparinization verified. Of note,  heparinization requires large doses of heparin as the patient seemed to be  quite resistant to heparin by subsequent monitoring of the activated  clotting time. Ultimately satisfactory anticoagulation was achieved.   Cardiopulmonary bypass was begun and the surface of the heart was inspected.  There was mild left ventricular hypertrophy.  There was diffuse hard  palpable calcified plaque within all the epicardial coronary arteries.  Distal sites were selected for coronary bypass grafting. Portions of  saphenous vein and the left internal mammary artery were trimmed to appropriate length. A temperature probe was placed left ventricular septum.   Cardioplegic catheter is placed in the ascending aorta.   The patient was cooled to 32 degrees systemic temperature.  The aortic  crossclamp applied and cardioplegia delivered in antegrade fashion through  the aortic root. Iced saline slush was applied for topical hypothermia.  The  initial cardioplegic arrest and myocardial cooling were felt to be  excellent. Repeat doses of cardioplegia were administered intermittently  throughout the crossclamp portion of the operation both through the aortic  root and down the subsequently placed vein graft to maintain septal  temperature below 15 degrees centigrade. The following distal coronary  anastomoses were performed:  (1)  The distal right coronary artery was  grafted with a saphenous vein graft in a side-to-side fashion.  This  coronary artery was diffusely diseased proximally. At the site of distal  bypass it measures 1.5 mm in diameter and was of fair quality. Only 1.0  probe would pass beyond the distal anastomosis. The right coronary artery is  relatively small as the patient is known to have codominant coronary  circulation.  (2)  The posterolateral branch off the distal left circumflex  coronary artery was grafted using a sequential saphenous vein graft off of  the vein placed to the distal right coronary artery. This was the largest  posterolateral branch off the distal left circumflex coronary artery. At the  site of distal bypass, it measures 1.2 mm in diameter. It is fair-quality  target at best.  (3)  The second circumflex marginal branch is grafted with  a saphenous vein graft in end-to-side fashion. This coronary measures 1.4 mm  in diameter and is of fair quality. There is diffuse plaque proximally and  distally. A 1.0 probe will pass in both directions.  (4)  The first diagonal  branch off the left anterior descending coronary artery was  grafted with a  saphenous vein graft in end-to-side fashion. This coronary measured 2 mm  in  diameter and is of fair to good quality target.  (5)  The distal left  anterior descending coronary artery was grafted with the left internal  mammary artery in end-to-side fashion. This vessel was large but calcified  with hard calcified plaque throughout the entire vessel. Ultimately a  localized area of endarterectomy was required in order to remove hard  calcified plaque in order to find an area where distal grafting can be  accomplished. The arteriotomy was opened with known hard palpable plaque at  the proximal end of the arteriotomy. The distal end of vessel was more  normal in appearance. The subsequent plaque located in the middle of the  arteriotomy and proximally was removed using everting technique of  endarterectomy. This feathers nicely at the midportion of the arteriotomy  and the distal portion of the vessel did not require endarterectomy. The  distal anastomosis was subsequently completed uneventfully.   All three proximal saphenous vein anastomoses were performed directly to the  ascending aorta prior to removal of the aortic crossclamp. Left ventricular  septal temperature was noted across rapidly upon reperfusion of the left  internal mammary artery. The lungs were ventilated and all air evacuated  from the aortic root. Aortic crossclamp was removed after a total crossclamp  time of 102 minutes.   The heart began to beat spontaneously without need for cardioversion. All  proximal and distal coronary anastomoses were inspected for hemostasis and  appropriate graft orientation. Epicardial pacing wires were fixed to the  right ventricular outflow tract and to the right atrial appendage. The  patient rewarmed to 37 degrees centigrade temperature. The patient weaned  from cardiopulmonary bypass without difficulty. The patient's rhythm at  separation from bypass was normal sinus rhythm. The patient weaned from  bypass on dopamine at 3 mcg/kg per minute. Total  cardiopulmonary bypass time  the operation 123 minutes.   Follow-up transesophageal echocardiogram performed by Dr. Ivin Booty after  separation from bypass demonstrates normal left ventricular function. No  other abnormalities were noted.   The venous and arterial cannulae were removed uneventfully. Protamine was  administered to reverse the anticoagulation. The mediastinum and left chest  were irrigated with saline solution containing vancomycin. Meticulous  surgical hemostasis ascertained. The mediastinum and both left and right  pleural spaces were drained with four chest tubes exited through separate  stab incisions inferiorly. The median sternotomy was closed with double-  strength sternal wire. Soft tissues anterior sternal closed in multiple  layers and the skin was closed with running subcuticular skin closure.   The patient tolerated procedure well and was transported to the surgical  intensive care unit in stable condition. There were no intraoperative  complications. All sponge, instrument and needle counts were verified  correct at completion the  operation.      Clar   CHO/MEDQ  D:  02/27/2004  T:  02/27/2004  Job:  [id number]   cc:   Carole Binning, M.D. [facility]   Sharyn Dross., M.D.  [address]  [address]  [place]  [place] [address]  Fax: [phone]

## 2010-07-05 NOTE — Cardiovascular Report (Signed)
NAMEJESSEE, [name]                 ACCOUNT NO.:  [id number]   MEDICAL RECORD NO.:  [id number]          PATIENT TYPE:  INP   LOCATION:  3701                         FACILITY:  [facility]   PHYSICIAN:  Carole Binning, M.D. LHCDATE OF BIRTH:  [birth year]   DATE OF PROCEDURE:  02/21/2004  DATE OF DISCHARGE:                              CARDIAC CATHETERIZATION   PROCEDURE PERFORMED:  Left heart catheterization with coronary angiography,  left ventriculography and abdominal aortography.   INDICATIONS:  Ms. Goshert is a 70 year old woman with history of coronary  artery disease.  She presented to the emergency room with chest discomfort  and ruled in for non-ST segment elevation myocardial infarction.   CATHETERIZATION PROCEDURAL NOTE:  A 6-French sheath was placed in the right  femoral artery.  The left coronary artery was imaged with a 6-French JL5  catheter.  The right coronary artery had a high anterior origin and was  imaged with a no-torque right catheter.  Left ventriculography and abdominal  aortography were performed with an angled pigtail catheter.  Contrast was  Omnipaque.  There were no complications.   RESULTS:   HEMODYNAMICS:  1.  Left ventricular pressure:  124/18.  2.  Aortic pressure:  124/78.  There was no aortic valve gradient.   LEFT VENTRICULOGRAM:  Wall motion was normal.  Ejection fraction is  estimated at 60%.  There was mitral regurgitation present; however, it  appeared to be managed secondary to ventricular ectopy.   ABDOMINAL AORTOGRAM:  Reveals a 20% stenosis in the left renal artery, minor  luminal irregularities in the right renal artery, with moderate diffuse  calcification in the abdominal aorta and iliac arteries, as well as mild  atherosclerosis of the iliac arteries.   CORONARY ARTERIOGRAPHY:  (Coronary arteries are heavily calcified throughout  and very tortuous).  1.  LEFT MAIN:  Has a distal 40% stenosis.  2.  LEFT ANTERIOR DESCENDING ARTERY:  Has  a tubular 80% stenosis in the      proximal vessel and a 30% stenosis of the mid vessel.  The LAD gives      rise to a large first diagonal branch, which has an 80% stenosis at its      origin.  There is a small second diagonal branch.  3.  LEFT CIRCUMFLEX:  A large co-dominant vessel.  There is a diffuse 80%      stenosis in the mid to distal circumflex.  Circumflex gives rise to a      small first obtuse marginal, followed by a large second obtuse marginal      branch.  The first obtuse marginal has a 30% stenosis at its origin.      The distal circumflex gives rise to small-sized first and second      posterolateral branches, and a normal sized third posterolateral branch.  4.  RIGHT CORONARY ARTERY:  A co-dominant vessel.  It is also diffusely      calcified.  There is a tubular 40% stenosis in the proximal vessel,      followed by a diffuse 99% stenosis in  the mid vessel.  The distal right      coronary gives rise to a normal sized posterior descending artery.   IMPRESSION:  1.  Normal left ventricular systolic function.  2.  Severe three-vessel coronary artery disease; with diffuse and heavy      calcification of all coronary arteries, as well as significant      calcification of the aorta.   PLAN:  The patient will be referred for evaluation for coronary bypass  surgery.      Mark   MWP/MEDQ  D:  02/21/2004  T:  02/21/2004  Job:  [id number]   cc:   Clyda Greener, M.D.   Charlton Haws, M.D.

## 2010-07-05 NOTE — Discharge Summary (Signed)
[name], [name]                 ACCOUNT NO.:  [id number]   MEDICAL RECORD NO.:  [id number]          PATIENT TYPE:  INP   LOCATION:  2036                         FACILITY:  [facility]   PHYSICIAN:  Salvatore Decent. Cornelius Moras, M.D. DATE OF BIRTH:  1940-10-09   DATE OF ADMISSION:  02/20/2004  DATE OF DISCHARGE:                                 DISCHARGE SUMMARY   ADDENDUM:  This is an addendum to a prior dictated discharge summary on Ms.  Jennette [name].  Patient's hospitalization was extended several days secondary to  persistent low-grade fever, anxiety attacks, and placement issues.  For a  brief history, [name] was found to have severe three-vessel coronary  artery disease on this admission and underwent coronary artery bypass  grafting on February 27, 2004.  Patient was initially ready for discharge on  March 05, 2004.  She was tentatively planned to be discharged home with  home-health nursing visits.  The patient and her family decided that she  would better be served by a short-term nursing home stay.  This was arranged  by case management, and the patient agreed to [facility].   In the meantime, the patient continues to have intermittent low-grade  fevers.  A urinalysis was negative, and her chest x-ray was clear.  Aggressive pulmonary toilet was encouraged secondary to a long-term history  of tobacco abuse.  Flutter valve was added to her pulmonary toilet as well  as albuterol MDI every 4-6 hours as needed.  In addition, the patient  continued to have intermittent anxiety attacks, which consisted of severe  shortness of breath, wheezing, and overwhelming emotions of anxiety.  Patient admitted to being very afraid and anxious for discharge after all  that she has been through.  A short-term anxiolytic was added for treatment  of her symptoms.  This included Xanax 0.5 mg twice daily.   Patient was then deemed appropriate for discharge to [facility]  for the anticipated  date of March 08, 2004.  She is being discharged in  improved and stable condition.   PHYSICAL EXAMINATION:  LUNGS:  Very faint bibasilar crackles and  intermittent inspiratory wheezes.  HEART:  Regular rate and rhythm, reading normal sinus rhythm on telemetry.  ABDOMEN:  Soft, nontender, nondistended with good bowel sounds.  EXTREMITIES:  Trace pedal edema bilaterally.  Her left lower extremity  incisions are healing well without evidence of infection.  MUSCULOSKELETAL:  Her sternum is stable.  Her incision is clean, dry and  intact.  Her skin staples will be removed prior to discharge.   DISCHARGE MEDICATIONS:  1.  Aspirin 81 mg daily.  2.  Lopressor 25 mg twice daily.  3.  Altace 2.5 mg twice daily.  4.  Crestor 20 mg daily.  5.  Plavix 75 mg daily.  6.  Xanax 0.5 mg twice daily.  7.  Tylox 1-2 tabs every 4-6 hours as needed for pain.   DISCHARGE INSTRUCTIONS:  Patient's instructions for activity, diet, and  wound care remain the same as the prior dictated discharge summary.   FOLLOW-UP APPOINTMENTS:  1.  We will maintain the appointment for the patient to see Dr. Cornelius Moras on      Monday, March 6th at 2:30 p.m.  2.  Patient is to call Dr. Gerri Spore to set up an appointment within two      weeks of discharge.  3.  Otherwise, the patient is instructed to notify the [facility] office if she      has any further questions or concerns.      [name]   CAF/MEDQ  D:  03/07/2004  T:  03/07/2004  Job:  [id number]

## 2010-07-05 NOTE — Consult Note (Signed)
[name], [name]                 ACCOUNT NO.:  [id number]   MEDICAL RECORD NO.:  [id number]          PATIENT TYPE:  INP   LOCATION:  3311                         FACILITY:  [facility]   PHYSICIAN:  Salvatore Decent. Cornelius Moras, M.D. DATE OF BIRTH:  [birth year]   DATE OF CONSULTATION:  02/21/2004  DATE OF DISCHARGE:                                   CONSULTATION   REQUESTING PHYSICIAN:  Dr. Loraine Leriche Pulsipher   PRIMARY CARE PHYSICIAN:  Dr. Britt Bottom Blunt   REASON FOR CONSULTATION:  Severe three-vessel coronary artery disease with  acute coronary syndrome and unstable angina.   HISTORY OF PRESENT ILLNESS:  [name] is a 70 year old African-American  female from [place] with known history of coronary artery disease as well  as history of hypertension, hyperlipidemia, and longstanding tobacco use.  The patient apparently suffered a non-Q-wave myocardial infarction in 1998.  She underwent cardiac catheterization followed by percutaneous coronary  intervention and stenting for two-vessel coronary artery disease at that  time.  Particular details of that intervention are not currently available.  The patient has done well medically since then and apparently had a stress  test 2 or 3 years ago that was unremarkable.  She states that she was in her  usual state of health until New Year's Eve when she first experienced some  mild substernal chest pain.  These symptoms were relatively mild and waxed  and waned overnight and seemed to resolve.  The following night she  developed recurrent episodes of pain that was more severe and persisted  intermittently over night.  By the morning of January 2nd her symptoms had  improved.  Yesterday, again the symptoms recurred with severe substernal  chest pain now radiating to her left arm and associated with some numbness  in her left arm.  She promptly presented for further management and therapy.  The patient was admitted to the hospital and has subsequently ruled out  for  myocardial infarction although cardiac enzymes are notable for mildly  elevated troponin levels with peak troponin-I measuring 0.29.  She was taken  for a cardiac catheterization today by Dr. Gerri Spore.  Findings at the time  of catheterization includes severe three-vessel coronary artery disease with  diffusely calcified vessels and coronary anatomy relatively unfavorable for  percutaneous coronary intervention.  Left ventricular function appears  preserved.  Cardiac surgical consultation has been requested.  Early  following the patient's catheterization, the patient did have some recurrent  episodes of chest pain but this has resolved with administration of  intravenous nitroglycerin, heparin, and Integrilin.   REVIEW OF SYSTEMS:  GENERAL:  The patient reports good appetite.  She has  not been gaining or losing weight recently.  RESPIRATORY:  Notable for  intermittent nonproductive cough that the patient attributed to longstanding  tobacco use.  The patient denies purulent sputum production, hemoptysis,  wheezing.  GASTROINTESTINAL:  Notable for history of symptoms of crampy  abdominal pain and bright red blood per rectum attributed to colitis in the  past.  This has not been problematic recently and seems to be primarily  related  to stress according to the patient.  The patient does have mild  dysphagia.  The patient reports intermittent symptoms of reflux.  The  patient denies history of hematemesis or melena.  NEUROLOGICAL:  Negative.  The patient denies symptoms suggestive of TIA or stroke.  MUSCULOSKELETAL:  Notable for bilateral chronic knee pain which is worse on the left than the  right.  GENITOURINARY:  Notable for some mild symptoms of nocturia.  The  patient denies urinary urgency or frequency.  INFECTIOUS:  Negative.  HEMATOLOGIC:  Negative.  ENDOCRINE:  Negative.  PSYCHIATRIC:  Negative.  HEENT:  Negative.  The patient has full set upper and lower dentures.  She   wears glasses for her vision.   PAST MEDICAL HISTORY:  1.  Coronary artery disease, status post non-Q-wave myocardial infarction in      1998 status post percutaneous coronary intervention and stenting x2.  2.  Hypertension.  3.  Hyperlipidemia.  4.  Colitis.  5.  Degenerative arthritis both knees.  6.  Longstanding tobacco abuse.   PAST SURGICAL HISTORY:  1.  Left total knee replacement 5 years ago.  2.  Total hysterectomy.   FAMILY HISTORY:  Noncontributory and notable for the absence of premature  coronary artery disease.   SOCIAL HISTORY:  The patient is married and lives with her husband in  [place].  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  She has a longstanding history of tobacco  abuse smoking 1 to 1-1/2 packs of cigarettes per day for many years.  The  patient also reports that she smokes marijuana on a monthly basis.  She  denies history of IV drug use.  The patient denies history of excessive  alcohol consumption although she does usually drink 1 shot of gin each week.   MEDICATIONS PRIOR TO ADMISSION:  1.  Norvasc 10 mg daily.  2.  Crestor 20 mg daily.   DRUG ALLERGIES:  None known.   PHYSICAL EXAMINATION:  GENERAL:  The patient is a well-appearing mildly  obese African-American female who appears her stated age in no acute  distress.  She currently denies any chest pain.  VITAL SIGNS:  She is in normal sinus rhythm with blood pressure 129/75,  pulse 59 and regular.  She is afebrile.  HEENT:  Grossly unrevealing.  NECK:  Supple.  There is no cervical nor supraclavicular lymphadenopathy.  There is no jugular venous distention.  No carotid bruits are noted.  CHEST:  Auscultation of the chest reveals clear breath sounds that are  symmetrical bilaterally.  No wheezes or rhonchi are noted.  CARDIOVASCULAR:  Regular rate and rhythm.  No murmurs, rubs, or gallops are  noted. ABDOMEN:  Soft and nontender.  Bowel  sounds are present.  EXTREMITIES:  Warm and well perfused.  There is no lower extremity edema.  Distal pulses are diminished but palpable in both lower legs in the  posterior tibial position.  There is no sign of significant venous  insufficiency.  SKIN:  Clean and dry and healthy-appearing throughout.  NEUROLOGICAL:  Grossly nonfocal and symmetrical.  RECTAL AND GENITOURINARY:  Both deferred.   DIAGNOSTIC TESTS:  Cardiac catheterization performed by Dr. Gerri Spore today  is reviewed.  This demonstrates severe three-vessel coronary artery disease  with normal left ventricular function.  There is heavy calcification in the  patient's coronary arteries.  There is long-segment 80% proximal stenosis of  the left anterior descending coronary artery.  There  is a very large first  diagonal branch but this does not appear to have significant proximal  disease.  The left circumflex coronary artery is a large vessel that gives  rise to a very large second circumflex marginal branch.  There may be 50%  proximal stenosis in this vessel.  There is long-segment 80% stenosis in the  distal left circumflex coronary artery.  Beyond this distal left circumflex  gives rise to a few small posterolateral branches.  These may not be large  enough for grafting.  There is co-dominant coronary circulation.  There is a  long-segment 95% stenosis of the mid right coronary artery.  Left  ventricular function appears preserved with no significant wall motion  abnormalities.   IMPRESSION:  Severe three-vessel coronary artery disease with class I stable  angina, acute coronary syndrome.  I believe that Ms. Montalban would best be  treated by elective coronary artery bypass grafting.  She has been receiving  oral Plavix therapy for the last 48 hours.  Presuming that she remains  stable on medical therapy she would probably benefit from 5 days to allow  for the Plavix to dissipate from her system.  If she develops  recurrent  symptoms of unstable angina despite IV nitroglycerin and heparin she may  need to proceed with surgery more urgently.   PLAN:  I have outlined options at length with Ms. Hurwitz here in her hospital  room this evening.  All of her questions have been addressed.  Alternative  treatment strategies have been discussed.  She understands and accepts  all  associated risks of surgery including but not limited to risks of death,  stroke, myocardial infarction, congestive heart failure, respiratory  failure, pneumonia, bleeding requiring blood transfusion, arrhythmia,  infection, and recurrent coronary artery disease.  We tentatively plan to  proceed with surgery on Tuesday, January 10th.  She also understands the  importance that she find a way to quit smoking.      Clar   CHO/MEDQ  D:  02/21/2004  T:  02/21/2004  Job:  [id number]   cc:   Carole Binning, M.D. [facility]   Charlton Haws, M.D.   Doneen Poisson, MD   Dorise Hiss, M.D. [address]  [place]  [place] [address]  Fax: [phone]

## 2010-07-05 NOTE — H&P (Signed)
NAMELORAH, [name] NO.:  [id number]   MEDICAL RECORD NO.:  [id number]          PATIENT TYPE:  EMS   LOCATION:  MAJO                         FACILITY:  [facility]   PHYSICIAN:  Charlton Haws, M.D.     DATE OF BIRTH:  1940/09/03   DATE OF ADMISSION:  02/20/2004  DATE OF DISCHARGE:                                HISTORY & PHYSICAL   PRIMARY CARDIOLOGIST:  Unknown.   PRIMARY PHYSICIAN:  Angelita Ingles Blunt, M.D.   GASTROINTESTINAL PHYSICIAN:  Dortha Kern, M.D.   SUMMARY OF HISTORY:  [name] is a 70 year old African American female who  presented to the [facility] complaining of chest discomfort.  She states that since Sunday she has had intermittent indigestion-like  feelings in her anterior chest.  This predominantly started Sunday evening.  She would belch, drink some water or walk around to obtain relief.  Each  episode would last approximately 20 minutes.  She cannot recall any  aggravating factors.  She denies any associated nausea, vomiting,  diaphoresis or shortness of breath.  She was unable to sleep Sunday evening  and on Monday she felt very weak.  She did not have any further episodes  until Monday evening when her symptoms returned and were similar to what she  had on Sunday.  She ate some mustard on a sandwich to help with her  indigestion.  She was able to sleep on Monday evening.  However, this  morning, the morning of admission, her symptoms returned.  She gave it a 10  on a scale of 0-10 at this point and she noticed tingling in both of her  arms.  This is the first time that it actually occurred during the day and  the worse that she had felt, thus her presentation. She does feel it is  similar to her presentation in 1998 when she had a stent, except this time  both arms are involved now.   ALLERGIES:  No known drug allergies, however, with nonsteroidals she states  that she has had problems with GI bleeds in the past secondary to  colitis.   PAST MEDICAL HISTORY:  Notable for:  1.  Colitis.  2.  Hypertension.  3.  Hyperlipidemia.  4.  Coronary artery disease with catheterization and status post stent      placement in 1998.  Details are pending.  5.  Status post hysterectomy.  6.  Left total knee surgery.   She denies any history of diabetes, CVA, myocardial infarction, bleeding  dyscrasias or thyroid dysfunction.   MEDICATIONS PRIOR TO ADMISSION:  1.  Norvasc 10 mg daily.  2.  Crestor 20 mg q.h.s.   SOCIAL HISTORY:  She resides in [place], [place], with her  husband.  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  She has two daughters who are alive and well.  One son is alive and well and  is currently in prison.  She has no grandchildren.  She smokes approximately  one pack per day for the past 50 years.  She takes  approximately one gin  shot one time per week and she will smoke pot about one time per month.  The  past time she smoked any pot was last week.  She denies any herbal  medications.  She does not follow a diet or exercise program.   FAMILY HISTORY:  Her mother died at the age of 47 post hip fracture.  There  is a questionable history of GI cancer.  Her father died at the age of 66  secondary to a brain tumor.  She has three sisters deceased, one in  childbirth, one with a brain tumor and one with breast cancer.  She has  three brothers deceased, two from cancer and the other unknown.  Six  brothers are alive, two of them have colitis.   REVIEW OF SYSTEMS:  Notable for a 30-pound weight loss over the last four to  five years, glasses, dentures, chronic dyspnea on exertion which has not  changed, chronic cough, nocturia, postmenopausal, weakness, arthralgias in  bilateral knees and GERD.   PHYSICAL EXAMINATION:  GENERAL APPEARANCE:  A well-nourished, well-  developed, pleasant, African American female in no apparent distress.  Although right now she appears very  comfortable.  She states her discomfort  is a 5 on a scale of 0-10.  VITAL SIGNS:  Her temperature is 99.1 degrees, pulse 83 and regular,  respirations 15, blood pressure 149/88 and 96% saturations on room air.  HEENT:  Unremarkable, except for dentures.  NECK:  Supple without thyromegaly, adenopathy, JVD or carotid bruits.  HEART:  Regular rate and rhythm.  Normal S1 and S2.  No murmurs, rubs,  clicks or gallops.  All pulses are symmetrical and intact.  CHEST:  Symmetrical excursion.  Clear to auscultation with decreased breath  sounds.  SKIN:  Integument is intact.  ABDOMEN:  Slightly obese.  Bowel sounds present without organomegaly, masses  or tenderness.  EXTREMITIES:  Negative cyanosis, clubbing or edema.  MUSCULOSKELETAL:  Unremarkable.  NEUROLOGIC:  Unremarkable.   LABORATORY DATA:  The chest x-ray shows no active disease and mild diffuse  peribronchial thickening.  EKG shows normal sinus rhythm, nonspecific ST-T  wave changes, normal axis and probable bilateral atrial enlargement.  Old  EKGs are not available.  The hemoglobin is 15.4, hematocrit 45.0, normal  indices, platelets 285 and WBC 9.6.  Sodium 135, potassium 4.1, BUN 9,  creatinine 0.7, glucose 114.  Normal LFTs.  PTT 25, PT 11.7, INR 0.8.  ER  marker was unremarkable, except for a troponin of 0.06.   IMPRESSION:  1.  Unstable angina.  The initial EKG is unremarkable.  2.  Tobacco use.  3.  Hypertension.  4.  Colitis.  5.  Hyperlipidemia.  6.  Coronary artery disease.   PLAN:  Dr. Eden Emms reviewed the patient's history, spoke with and examined  the patient.  He agrees with the above.  We will admit the patient to rule  out myocardial infarction given her current tobacco use, history of coronary  artery disease, stenting, hypertension, hyperlipidemia and ongoing chest  discomfort.  A cardiac catheterization will be arranged for in the morning. Meanwhile, we will start IV heparin, nitroglycerin paste, Protonix,  Plavix,  a baby aspirin, as well as her home medications.  We will check her lipids  and TSH in the morning.  I have called the office  to have her chart pulled from the warehouse and I have also called [facility] to obtain microfilm records of her  prior  catheterization.  Dr. Eden Emms will review her catheterization films.  We will  also obtain a tobacco cessation consult.       EW/MEDQ  D:  02/20/2004  T:  02/20/2004  Job:  [id number]   cc:   Sharyn Dross., M.D.  [address]  [address]  [place]  [place] [address]  Fax: [phone]   A. V. Blunt

## 2010-07-05 NOTE — Assessment & Plan Note (Signed)
[facility]                         GASTROENTEROLOGY OFFICE NOTE   NAME:[name], [name] EISENSTEIN                        MRN:          [id number]  DATE:03/30/2006                            DOB:          11-Jul-1940    PROBLEM:  Left sided colitis.   Ms. Speas has returned for a scheduled followup.  On prednisone 40 mg a  day, her recent flare has subsided.  She is currently taking 30 mg daily  in addition to her Lialda.  Stools are solid and she is without pain.  She has had scant rectal bleeding.  She also has a history of colon  polyps and was last examined in March 2006.   PHYSICAL EXAMINATION:  VITAL SIGNS:  Pulse 75, blood pressure 120/80,  weight 251.   IMPRESSION:  Left sided colitis, now in remission.   RECOMMENDATIONS:  1. Continue prednisone taper every 7 days, while continuing Lialda.  2. Follow up colonoscopy in approximately a year.     Barbette Hair. Arlyce Dice, MD,FACG  Electronically Signed    RDK/MedQ  DD: 03/30/2006  DT: 03/30/2006  Job #: [id number]

## 2010-07-05 NOTE — Discharge Summary (Signed)
[name], [name]                 ACCOUNT NO.:  [id number]   MEDICAL RECORD NO.:  [id number]          PATIENT TYPE:  INP   LOCATION:  2036                         FACILITY:  [facility]   PHYSICIAN:  Eber Jones A. Francis, P.A.DATE OF BIRTH:  07-11-1940   DATE OF ADMISSION:  02/20/2004  DATE OF DISCHARGE:  03/06/2004                                 DISCHARGE SUMMARY   ADMISSION DIAGNOSIS:  Known history of coronary artery disease with  persistent angina.   ADDITIONAL DIAGNOSES:  1.  Known history of coronary artery disease, status post non-Q wave      myocardial infarction in 1998.  2.  History of cardiac catheterization with PTCA and stenting for two-vessel      coronary artery disease in 1998.  3.  Longstanding tobacco abuse.  4.  Hypertension.  5.  Hyperlipidemia.  6.  Mild to moderate left internal carotid artery stenosis at 40 to 60%.  7.  History of colitis.  8.  Degenerative arthritis in both knees.  9.  History of left total knee replacement.  10. History of total hysterectomy.  11. History of marijuana use which she reports on a monthly basis.   HOSPITAL MANAGEMENT/PROCEDURES:  1.  Cardiac catheterization on February 21, 2004 by Carole Binning, M.D.      This revealed severe three-vessel coronary artery disease with diffuse      and heavy calcification of all coronary arteries, as well as significant      calcification of the aorta.  The patient's left ventricular systolic      function was normal with an estimated ejection fraction of 60%.  2.  Preoperative arterial evaluation completed on February 22, 2004 revealed      mild to moderate left internal carotid artery stenosis at 40 to 60%.      Otherwise, there was no evidence of right internal carotid artery      stenosis.  3.  Coronary artery bypass grafting times five as well as coronary      endarterectomy of the left anterior descending completed on February 27, 2004.  Grafts included the left internal mammary artery  to the LAD,      saphenous vein graft to the circumflex marginal branch #2, saphenous      vein graft to the diagonal branch #1, and saphenous vein graft sequence      from the distal right coronary artery to the left posterior lateral      coronary artery.  4.  Initiation of cardiac rehabilitation phase 1.   CONSULTATIONS:  1.  Physical therapy.  2.  Cardiac rehabilitation.  3.  Case management.   HISTORY OF PRESENT ILLNESS:  [name] is a pleasant 70 year old African-  American female from [place] who has a history of known coronary artery  disease, status post acute myocardial infarction in 1998 and subsequent PTCA  and stenting at that time.  The patient also has cardiac risk factors of  hypertension, hyperlipidemia and longstanding tobacco abuse.  The patient  presented to the emergency department on February 20, 2004  with symptoms of  class 4 unstable angina.  The patient was admitted and ruled out for acute  myocardial infarction.  The plan was to proceed with cardiac catheterization  for further evaluation.   HOSPITAL COURSE:  [name] was admitted to [facility] on February 20, 2004 for unstable angina.  She was ruled out for acute myocardial infarction  and scheduled for cardiac catheterization.  The patient was taken to the  cardiac catheterization lab on February 21, 2004 and underwent left heart  catheterization with coronary angiography, left ventriculography and  abdominal aortography.  This revealed normal left ventricular systolic  function with an estimated ejection fraction at 60%.  There was a 20%  stenosis of the left renal artery and moderate diffuse calcification in the  midabdominal aorta and iliac arteries.  The left main had a distal 40%  stenosis.  The left anterior descending coronary artery had a tubular 80%  stenosis in the proximal vessel and a 30% stenosis of the midvessel.  A  large first diagonal branch from the LAD had an 80% stenosis at its  origin.  There was a diffuse 80% stenosis in the mid to distal left circumflex.  There was a diffuse 99% stenosis in the midvessel of the right coronary  artery.  With these findings, a CVTS consultation was initiated for  evaluation for coronary artery bypass grafting.   Dr. Cornelius Moras saw and examined the patient in consultation on February 21, 2004.  His impression was that indeed, the patient had severe three-vessel coronary  artery disease, nonamenable to percutaneous intervention.  He felt that the  patient would best be treated by elective coronary artery bypass grafting.  Since she had been receiving oral Plavix therapy for the past 48 hours, the  surgery was scheduled for five days later to allow for the Plavix to  dissipate from her system.  The risks, benefits and alternatives of the  procedure were discussed with the patient and her family at that time.  The  patient was in understanding and agreed to proceed as planned.  The patient  remained hemodynamically stable and free of chest pain on IV heparin,  nitroglycerin and Integrelin until the time of surgery.   The patient was taken to the operating room on February 27, 2004 and  underwent coronary artery bypass grafting times five with grafts as stated  above.  The left anterior descending coronary artery was diffusely diseased  with heavily calcified plaque and an endarterectomy was completed prior to  placement of the left internal mammary artery for grafting.  The greater  saphenous vein was harvested using open technique from the left leg and was  fair to poor quality.  The right leg was explored and no adequate saphenous  vein was found.  Overally, the patient tolerated this procedure well and was  transferred to the surgical intensive care unit in critical but stable  condition.   Postoperatively, the patient awoke from anesthesia neurologically intact and was extubated without difficulty on the evening of her operative day.   She  otherwise progressed in routine fashion and completed the steps towards  recovery after cardiac surgery.  The patient was transferred to the step  down unit on postoperative day #2.  While on the step down unit, the patient  was initiated on cardiac rehabilitation phase 2 and her activity was  advanced as tolerated.  She otherwise continued to do well without any  complications and was deemed appropriate for  initiation of discharge  planning on postoperative day #6 or March 05, 2004.   At time of discharge planning, the patient was doing quite well.  She was  ambulating independently in the hallways and with cardiac rehabilitation.  She has resumed normal bowel and bladder function.  She was tolerating a  regular diet.  She had remained afebrile for greater than 48 hours.  Her  blood pressure was stable in the 90-110/60-80.  Her heart rate and rhythm  remained stable in a regular rate and rhythm, reading normal sinus rhythm on  telemetry.  Her SPO2 was 90 to 94% on room air.  Her weight is at 226 pounds  with a preoperative weight of 226 pounds.   PHYSICAL EXAMINATION:  LUNGS:  Clear to auscultation.  ABDOMEN:  Soft, nontender, nondistended with good bowel sounds.  EXTREMITIES:  1+ ankle edema on the left greater than the right.  Her  incisions were healing well without evidence of infection.  Staples remained  intact in the left lower extremity.   DISPOSITION:  [name] is being discharged to home in improved and stable  condition with the anticipated date of March 06, 2004.  We will continue  as planned, pending a.m. rounds and no change in the patient's clinical  status.   DISCHARGE MEDICATIONS:  1.  Aspirin 81 mg daily.  2.  Lopressor 25 mg twice daily.  3.  Altace 2.5 mg twice daily.  4.  Crestor 20 mg daily.  5.  Plavix 75 mg daily.  6.  Lasix 40 mg daily for five days.  7.  Potassium 20 mEq daily for five days.  8.  Tylox one to two tabs every four to six hours as  needed for pain.   DISCHARGE INSTRUCTIONS:  1.  Activity:  The patient is to avoid driving.  She is to avoid heavy      lifting or strenuous activity.  She should continue to walk daily and      should continue her breathing exercises for one more week.  2.  Diet.  The patient is to follow low fat, low salt, heart healthy diet.  3.  Wound care:  The patient may shower.  Her should wash her incisions      daily with soap and water.  The patient should notify the CVTS office if      she has any redness, swelling or drainage from her incision sites or if      she has a fever of greater than 101.0.   SPECIAL INSTRUCTIONS:  1.  The patient is to absolutely refrain from smoking.  2.  The patient has been discussing discharge arrangements with case      management.  The tentative plan at this point is for possible home      health nursing visits versus short term nursing home placement. 3.  At the time of this dictation, a PT consultation was initiated to      determine the patient's level of care needs.   FOLLOW UP:  1.  The patient is scheduled for staple removal on March 08, 2004 at 11      a.m. at the CVTS office.  2.  The patient is to schedule an appointment to see Dr. Gerri Spore within      two weeks of discharge.  She is to call [phone] to make this      appointment date and time.  3.  The patient is scheduled to see  Dr. Cornelius Moras on Monday, April 22, 2004 at      2:30 p.m.       CAF/MEDQ  D:  03/05/2004  T:  03/05/2004  Job:  [id number]   cc:   Carole Binning, M.D. [facility]   Dr. Bruna Potter, [place]

## 2010-07-05 NOTE — Assessment & Plan Note (Signed)
[facility]                         GASTROENTEROLOGY OFFICE NOTE   NAME:[name], [name]                        MRN:          [id number]  DATE:05/11/2006                            DOB:          Oct 17, 1940    PROBLEM:  Colitis.   REASON:  [name] has returned for ongoing care. A sigmoidoscopy  demonstrated an active colitis. She currently is taking;  1. Prednisone 15 mg a day.  2. Lialda 4.8 grams a day.  On this regiment her symptoms have subsided. The colitis extended into  her left colon as far as the scope could reach.   On exam, pulse 78, blood pressure 110/60, weight 251.   IMPRESSION:  Ulcerative colitis with primarily left-sided involvement.   RECOMMENDATIONS:  Slow steroid taper while continuing Lialda.     Barbette Hair. Arlyce Dice, MD,FACG  Electronically Signed    RDK/MedQ  DD: 05/11/2006  DT: 05/11/2006  Job #: [id number]

## 2010-07-05 NOTE — Assessment & Plan Note (Signed)
[facility]                         GASTROENTEROLOGY OFFICE NOTE   NAME:[name], [name]                        MRN:          [id number]  DATE:04/21/2006                            DOB:          1940/09/15    PROBLEM:  Colitis.   Mrs. Erno has returned for scheduled GI followup.  She continues to have  some intermittent, loose stool.  She sees blood on the toilet tissue.  She is currently taking 20 mg of prednisone __________ .  Colonoscopy a  year ago demonstrated colitis involving the entire colon, especially in  the left colon.   PHYSICAL EXAMINATION:  Pulse 75, blood pressure 120/80, weight 251.  Abdomen is without masses, tenderness or organomegaly.   IMPRESSION:  Persistent rectal bleeding with loose stools.  This could  be due to persistent colitis.   RECOMMENDATION:  Sigmoidoscopy for reevaluation.     Barbette Hair. Arlyce Dice, MD,FACG  Electronically Signed    RDK/MedQ  DD: 04/21/2006  DT: 04/21/2006  Job #: [id number]

## 2010-07-05 NOTE — Op Note (Signed)
[name], HISE                 ACCOUNT NO.:  [id number]   MEDICAL RECORD NO.:  [id number]          PATIENT TYPE:  INP   LOCATION:  2308                         FACILITY:  [facility]   PHYSICIAN:  Sheldon Silvan, M.D.      DATE OF BIRTH:  October 05, 1940   DATE OF PROCEDURE:  02/27/2004  DATE OF DISCHARGE:                                 OPERATIVE REPORT   PROCEDURE:  Intraoperative transesophageal echocardiography (TEE).   [name] was brought to the operating room today by Dr. Cornelius Moras for coronary  artery bypass grafting.  There was some question of whether or not she might  have mitral regurgitation that was significant.  TEE was requested to both  monitor and diagnose problems that she might encounter during the surgery.   After adequate induction of endotracheal general anesthesia was performed,  the sheathed and lubricated [employer] TEE probe was passed  through the oropharynx atraumatically into the esophagus.  The heart was  imaged easily.   The left ventricle was seen to be thickened but contracting well in all  segments.   The mitral valve was imaged, and there was 1+ regurgitation noted centrally.  The leaflets were not particularly fixed and moved well.   The left atrial appendage was difficult to see but appeared free of clot or  smoke.  The aortic valve was imaged and was tricuspid with very little  sclerosis noted.  On color-flow exam, there was trace regurgitation  centrally through the valve.  Examination of the interatrial septum showed  there to be no PFO by color-flow exam.  The right ventricle was noted to  move well.  The tricuspid valve appeared normal in structure grossly;  however, there was 1-2+ regurgitation noted in the tricuspid area centrally.   The patient was placed on cardiopulmonary bypass and coronary artery bypass  grafting was accomplished by Dr. Cornelius Moras.   On weaning from bypass, the heart was imaged again and the ventricle was  seen to be  contracting well.  There were no segmental changes.  The valvular  structures appeared unchanged from pre-bypass and there was no change in the  amount of regurgitant flow noted through either the mitral or aortic valves.  At the completion of the operation, the probe was removed and the patient  was transported to the SICU in good condition.      EAVW   DC/MEDQ  D:  02/27/2004  T:  02/27/2004  Job:  [id number]

## 2010-07-17 ENCOUNTER — Ambulatory Visit
Admission: RE | Admit: 2010-07-17 | Discharge: 2010-07-17 | Disposition: A | Discharge status: Home / Self Care | Payer: Medicare Other | Source: Ambulatory Visit | Attending: Family Medicine | Admitting: Family Medicine

## 2010-07-17 DIAGNOSIS — Z1231 Encounter for screening mammogram for malignant neoplasm of breast: Secondary | ICD-10-CM

## 2010-07-30 ENCOUNTER — Other Ambulatory Visit: Payer: Self-pay

## 2010-08-05 ENCOUNTER — Other Ambulatory Visit (INDEPENDENT_AMBULATORY_CARE_PROVIDER_SITE_OTHER): Payer: Medicare Other

## 2010-08-05 DIAGNOSIS — I6529 Occlusion and stenosis of unspecified carotid artery: Secondary | ICD-10-CM

## 2010-08-23 NOTE — Procedures (Unsigned)
CAROTID DUPLEX EXAM  INDICATION:  Follow up carotid disease.  HISTORY: Diabetes:  No. Cardiac:  Yes. Hypertension:  Yes. Smoking:  Yes. Previous Surgery:  No. CV History:  No. Amaurosis Fugax No, Paresthesias No, Hemiparesis No.                                      RIGHT             LEFT Brachial systolic pressure:         106               104 Brachial Doppler waveforms:         WNL               WNL Vertebral direction of flow:        Antegrade         Antegrade DUPLEX VELOCITIES (cm/sec) CCA peak systolic                   71                78 ECA peak systolic                   68                50 ICA peak systolic                   42                131 ICA end diastolic                   19                45 PLAQUE MORPHOLOGY:                  Calcific          Calcific PLAQUE AMOUNT:                      Mild              Moderate PLAQUE LOCATION:                    ICA               ICA  IMPRESSION: 1. 1% to 39% right internal carotid artery stenosis. 2. 40% to 59% left internal carotid artery stenosis. 3. Bilateral vertebral arteries are within normal limits. 4. Of note, plaque is calcific, and velocities may be under-estimated     bilaterally.  Internal carotid arteries are tortuous, also making     evaluation difficult. 5. Incidental finding:  Hyperechoic vascularized masses observed in     the thyroid.  ___________________________________________ Di Kindle. Edilia Bo, M.D.  LT/MEDQ  D:  08/05/2010  T:  08/05/2010  Job:  [id number]

## 2010-12-03 LAB — COMPREHENSIVE METABOLIC PANEL
ALT: 14
AST: 15
AST: 19
AST: 21
AST: 22
Albumin: 2.6 — ABNORMAL LOW
Albumin: 2.9 — ABNORMAL LOW
Albumin: 3.1 — ABNORMAL LOW
Albumin: 3.6
Alkaline Phosphatase: 50
BUN: 13
CO2: 27
Calcium: 8.5
Chloride: 101
Chloride: 103
Chloride: 105
Chloride: 105
Creatinine, Ser: 0.85
Creatinine, Ser: 0.93
GFR calc Af Amer: >60
GFR calc Af Amer: >60
GFR calc Af Amer: >60
GFR calc Af Amer: >60
GFR calc non Af Amer: 59 — ABNORMAL LOW
Potassium: 3.4 — ABNORMAL LOW
Potassium: 4.1
Potassium: 4.4
Sodium: 133 — ABNORMAL LOW
Sodium: 139
Total Bilirubin: 0.6
Total Bilirubin: 0.8
Total Protein: 6

## 2010-12-03 LAB — CBC
HCT: 34.9 — ABNORMAL LOW
HCT: 36.9
HCT: 37.7
Hemoglobin: 12.1
Hemoglobin: 12.6
Hemoglobin: 12.7
MCHC: 34.7
MCHC: 34.7
MCV: 94.1
MCV: 94.2
MCV: 95.1
Platelets: 246
Platelets: 267
Platelets: 273
RBC: 3.71 — ABNORMAL LOW
RBC: 3.88
RDW: 13.3
RDW: 13.7
WBC: 10.3
WBC: 7.6
WBC: 8.9
WBC: 8.9

## 2010-12-03 LAB — DIFFERENTIAL
Basophils Relative: 1
Lymphs Abs: 1
Monocytes Relative: 3
Neutro Abs: 7.6
Neutrophils Relative %: 85 — ABNORMAL HIGH

## 2010-12-03 LAB — URINE MICROSCOPIC-ADD ON

## 2010-12-03 LAB — HEPATIC FUNCTION PANEL
ALT: 20
Alkaline Phosphatase: 51
Indirect Bilirubin: 1 — ABNORMAL HIGH
Total Bilirubin: 1.1
Total Protein: 5.9 — ABNORMAL LOW

## 2010-12-03 LAB — LIPID PANEL
Cholesterol: 128
LDL Cholesterol: 59
Triglycerides: 53
VLDL: 11

## 2010-12-03 LAB — B-NATRIURETIC PEPTIDE (CONVERTED LAB)
Pro B Natriuretic peptide (BNP): 149 — ABNORMAL HIGH
Pro B Natriuretic peptide (BNP): 236 — ABNORMAL HIGH

## 2010-12-03 LAB — TSH
TSH: 0.168 — ABNORMAL LOW
TSH: 0.204 — ABNORMAL LOW

## 2010-12-03 LAB — URINALYSIS, ROUTINE W REFLEX MICROSCOPIC
Glucose, UA: NEGATIVE
Ketones, ur: NEGATIVE
Leukocytes, UA: NEGATIVE
pH: 6

## 2010-12-03 LAB — BASIC METABOLIC PANEL
BUN: 14
BUN: 4 — ABNORMAL LOW
CO2: 23
CO2: 26
CO2: 27
Calcium: 8.8
Chloride: 101
Chloride: 101
Chloride: 102
Chloride: 98
Creatinine, Ser: 0.79
Creatinine, Ser: 0.83
Creatinine, Ser: 0.89
GFR calc Af Amer: >60
GFR calc Af Amer: >60
Glucose, Bld: 170 — ABNORMAL HIGH
Potassium: 3.5
Potassium: 3.7
Sodium: 134 — ABNORMAL LOW

## 2010-12-03 LAB — C3 COMPLEMENT: C3 Complement: 149

## 2010-12-03 LAB — CK TOTAL AND CKMB (NOT AT ARMC)
CK, MB: 1.6
CK, MB: 1.9
Relative Index: 1.2
Relative Index: 1.3
Total CK: 131

## 2010-12-03 LAB — HOMOCYSTEINE: Homocysteine: 13.8

## 2010-12-03 LAB — HEMOGLOBIN A1C: Hgb A1c MFr Bld: 7.3 — ABNORMAL HIGH

## 2010-12-03 LAB — CALCIUM: Calcium: 9.3

## 2010-12-03 LAB — APTT: aPTT: 28

## 2010-12-03 LAB — TROPONIN I: Troponin I: 0.04

## 2010-12-19 ENCOUNTER — Other Ambulatory Visit: Payer: Self-pay | Admitting: Cardiology

## 2011-01-14 ENCOUNTER — Ambulatory Visit (INDEPENDENT_AMBULATORY_CARE_PROVIDER_SITE_OTHER): Payer: Medicare Other | Admitting: Cardiology

## 2011-01-14 ENCOUNTER — Encounter: Payer: Self-pay | Admitting: Cardiology

## 2011-01-14 ENCOUNTER — Encounter: Payer: Self-pay | Admitting: *Deleted

## 2011-01-14 DIAGNOSIS — F17200 Nicotine dependence, unspecified, uncomplicated: Secondary | ICD-10-CM

## 2011-01-14 DIAGNOSIS — I2510 Atherosclerotic heart disease of native coronary artery without angina pectoris: Secondary | ICD-10-CM

## 2011-01-14 DIAGNOSIS — I10 Essential (primary) hypertension: Secondary | ICD-10-CM

## 2011-01-14 DIAGNOSIS — E785 Hyperlipidemia, unspecified: Secondary | ICD-10-CM

## 2011-01-14 DIAGNOSIS — I679 Cerebrovascular disease, unspecified: Secondary | ICD-10-CM

## 2011-01-14 LAB — BASIC METABOLIC PANEL
CO2: 22 mEq/L (ref 19–32)
Chloride: 103 mEq/L (ref 96–112)
Glucose, Bld: 104 mg/dL — ABNORMAL HIGH (ref 70–99)
Potassium: 4.9 mEq/L (ref 3.5–5.1)
Sodium: 136 mEq/L (ref 135–145)

## 2011-01-14 LAB — LIPID PANEL
HDL: 60.8 mg/dL (ref >39.00)
Total CHOL/HDL Ratio: 2
VLDL: 14.4 mg/dL (ref 0.0–40.0)

## 2011-01-14 LAB — HEPATIC FUNCTION PANEL: Total Bilirubin: 1.3 mg/dL — ABNORMAL HIGH (ref 0.3–1.2)

## 2011-01-14 NOTE — Assessment & Plan Note (Signed)
Plan continue statin. Patient with some GI intolerance to aspirin in the past. Will try enteric-coated aspirin 81 mg daily. Last Myoview negative.

## 2011-01-14 NOTE — Assessment & Plan Note (Signed)
Continue statin. Check lipids and liver. 

## 2011-01-14 NOTE — Patient Instructions (Signed)
Your physician wants you to follow-up in: ONE YEAR You will receive a reminder letter in the mail two months in advance. If you don't receive a letter, please call our office to schedule the follow-up appointment.   Your physician has requested that you have a carotid duplex. This test is an ultrasound of the carotid arteries in your neck. It looks at blood flow through these arteries that supply the brain with blood. Allow one hour for this exam. There are no restrictions or special instructions.   Your physician recommends that you return for lab work in: TODAY  START ASPIRIN 81 MG ONCE DAILY WITH FOOD

## 2011-01-14 NOTE — Assessment & Plan Note (Signed)
Blood pressure controlled. Continue present medications. Check potassium and renal function. 

## 2011-01-14 NOTE — Progress Notes (Signed)
HPI:[name] is a very pleasant female that I have seen in the past for coronary artery disease.  She has had prior coronary artery bypass grafting in 2006.  Her LV function is normal.  Echocardiogram in July of 2008 revealed normal LV function, mild aortic insufficiency and mild tricuspid regurgitation. Her most recent Myoview was performed in June of 2011.  There was no scar or ischemia and her ejection fraction was 76%.  Carotid Doppler in June of 2011 showed 0-39 bilateral and fu recommended in one year. I last saw her in May 2011. Since then she has dyspnea with more extreme activities but not with routine activities. It is relieved with rest. There is no orthopnea, PND, pedal edema, palpitations or syncope. Note she does not have associated chest pain or dyspnea. She does not have exertional chest pain.   Current Outpatient Prescriptions  Medication Sig Dispense Refill  . CRESTOR 40 MG tablet TAKE 1 TABLET AT BEDTIME  90 tablet  2  . DIOVAN 160 MG tablet TAKE 1 TABLET DAILY  90 tablet  2  . hydrochlorothiazide (MICROZIDE) 12.5 MG capsule TAKE 1 CAPSULE ONCE DAILY  90 capsule  1  . LIALDA 1.2 G EC tablet TAKE 4 TABLETS BY MOUTH ONCE DAILY  120 tablet  8  . metoprolol tartrate (LOPRESSOR) 25 MG tablet TAKE 1 TABLET TWICE A DAY  180 tablet  1     Past Medical History  Diagnosis Date  . CAD (coronary artery disease)     unspecified  . Cerebrovascular disease   . Hyperlipidemia, mixed   . HTN (hypertension)   . Ulcerative colitis   . Diverticulitis   . Colon polyps 2006  . Thyroid disease     Past Surgical History  Procedure Date  . Cholecystectomy 7.06.2008    with cholangiogram  . Coronary artery bypass graft 1.10.2006    History   Social History  . Marital Status: Married    Spouse Name: N/A    Number of Children: N/A  . Years of Education: N/A   Occupational History  . Not on file.   Social History Main Topics  . Smoking status: Current Everyday Smoker -- 1.5  packs/day    Types: Cigarettes  . Smokeless tobacco: Not on file  . Alcohol Use: No  . Drug Use: No  . Sexually Active: Not on file   Other Topics Concern  . Not on file   Social History Narrative  . No narrative on file    ROS: Pain in feet but no fevers or chills, productive cough, hemoptysis, dysphasia, odynophagia, melena, hematochezia, dysuria, hematuria, rash, seizure activity, orthopnea, PND, pedal edema, claudication. Remaining systems are negative.  Physical Exam: Well-developed well-nourished in no acute distress.  Skin is warm and dry.  HEENT is normal.  Neck is supple. No thyromegaly.  Chest is clear to auscultation with normal expansion.  Cardiovascular exam is regular rate and rhythm.  Abdominal exam nontender or distended. No masses palpated. Extremities show no edema. neuro grossly intact  ECG Sinus with pacs

## 2011-01-14 NOTE — Assessment & Plan Note (Signed)
Scheduled follow-up carotid Dopplers. 

## 2011-01-14 NOTE — Assessment & Plan Note (Signed)
Patient counseled on discontinuing. 

## 2011-01-22 ENCOUNTER — Telehealth: Payer: Self-pay | Admitting: Cardiology

## 2011-01-22 NOTE — Telephone Encounter (Signed)
Spoke with pt, pt told to take muciex dm or robitussin dm for cold symptoms. She feels she may need antibiotics. Referred to urgent care or pcp for antibiotics [name]

## 2011-01-22 NOTE — Telephone Encounter (Signed)
New Msg: pt calling regarding pt recently contracting what is going around and pt wants to know if MD can give pt something to help with congestion and runny nose. Please return pt call to discuss further.

## 2011-01-28 ENCOUNTER — Other Ambulatory Visit: Payer: Self-pay | Admitting: Family Medicine

## 2011-01-28 ENCOUNTER — Ambulatory Visit
Admission: RE | Admit: 2011-01-28 | Discharge: 2011-01-28 | Disposition: A | Discharge status: Home / Self Care | Payer: Medicare Other | Source: Ambulatory Visit | Attending: Family Medicine | Admitting: Family Medicine

## 2011-01-28 DIAGNOSIS — J449 Chronic obstructive pulmonary disease, unspecified: Secondary | ICD-10-CM

## 2011-02-04 ENCOUNTER — Encounter: Payer: Medicare Other | Admitting: *Deleted

## 2011-02-10 ENCOUNTER — Other Ambulatory Visit: Payer: Self-pay | Admitting: Cardiology

## 2011-02-13 ENCOUNTER — Emergency Department (HOSPITAL_COMMUNITY): Payer: Medicare Other

## 2011-02-13 ENCOUNTER — Other Ambulatory Visit: Payer: Self-pay

## 2011-02-13 ENCOUNTER — Encounter (HOSPITAL_COMMUNITY): Payer: Self-pay | Admitting: *Deleted

## 2011-02-13 ENCOUNTER — Emergency Department (HOSPITAL_COMMUNITY)
Admission: EM | Admit: 2011-02-13 | Discharge: 2011-02-13 | Disposition: A | Discharge status: Home / Self Care | Payer: Medicare Other | Attending: Emergency Medicine | Admitting: Emergency Medicine

## 2011-02-13 DIAGNOSIS — R079 Chest pain, unspecified: Secondary | ICD-10-CM | POA: Insufficient documentation

## 2011-02-13 DIAGNOSIS — E785 Hyperlipidemia, unspecified: Secondary | ICD-10-CM | POA: Insufficient documentation

## 2011-02-13 DIAGNOSIS — I2510 Atherosclerotic heart disease of native coronary artery without angina pectoris: Secondary | ICD-10-CM | POA: Insufficient documentation

## 2011-02-13 DIAGNOSIS — K5190 Ulcerative colitis, unspecified, without complications: Secondary | ICD-10-CM | POA: Insufficient documentation

## 2011-02-13 DIAGNOSIS — J029 Acute pharyngitis, unspecified: Secondary | ICD-10-CM | POA: Insufficient documentation

## 2011-02-13 DIAGNOSIS — I10 Essential (primary) hypertension: Secondary | ICD-10-CM | POA: Insufficient documentation

## 2011-02-13 DIAGNOSIS — J3489 Other specified disorders of nose and nasal sinuses: Secondary | ICD-10-CM | POA: Insufficient documentation

## 2011-02-13 DIAGNOSIS — I252 Old myocardial infarction: Secondary | ICD-10-CM | POA: Insufficient documentation

## 2011-02-13 DIAGNOSIS — J40 Bronchitis, not specified as acute or chronic: Secondary | ICD-10-CM

## 2011-02-13 DIAGNOSIS — R05 Cough: Secondary | ICD-10-CM | POA: Insufficient documentation

## 2011-02-13 DIAGNOSIS — R6889 Other general symptoms and signs: Secondary | ICD-10-CM | POA: Insufficient documentation

## 2011-02-13 DIAGNOSIS — J329 Chronic sinusitis, unspecified: Secondary | ICD-10-CM

## 2011-02-13 DIAGNOSIS — R51 Headache: Secondary | ICD-10-CM | POA: Insufficient documentation

## 2011-02-13 DIAGNOSIS — R059 Cough, unspecified: Secondary | ICD-10-CM | POA: Insufficient documentation

## 2011-02-13 HISTORY — DX: Acute myocardial infarction, unspecified: I21.9

## 2011-02-13 MED ORDER — AZITHROMYCIN 250 MG PO TABS
500.0000 mg | ORAL_TABLET | Freq: Once | ORAL | Status: AC
  Administered 2011-02-13: 500 mg via ORAL
  Filled 2011-02-13: qty 2

## 2011-02-13 MED ORDER — HYDROCOD POLST-CHLORPHEN POLST 10-8 MG/5ML PO LQCR
5.0000 mL | Freq: Once | ORAL | Status: AC
  Administered 2011-02-13: 5 mL via ORAL
  Filled 2011-02-13: qty 5

## 2011-02-13 MED ORDER — ALBUTEROL SULFATE HFA 108 (90 BASE) MCG/ACT IN AERS
1.0000 | INHALATION_SPRAY | Freq: Four times a day (QID) | RESPIRATORY_TRACT | Status: DC | PRN
  Administered 2011-02-13: 2 via RESPIRATORY_TRACT
  Filled 2011-02-13: qty 6.7

## 2011-02-13 MED ORDER — AZITHROMYCIN 250 MG PO TABS: 250.0000 mg | ORAL_TABLET | Freq: Every day | ORAL | Status: AC

## 2011-02-13 MED ORDER — AEROCHAMBER PLUS W/MASK MISC
Status: AC
  Administered 2011-02-13: 10:00:00
  Filled 2011-02-13: qty 1

## 2011-02-13 MED ORDER — HYDROCOD POLST-CHLORPHEN POLST 10-8 MG/5ML PO LQCR: 5.0000 mL | Freq: Two times a day (BID) | ORAL | Status: DC | PRN

## 2011-02-13 MED ORDER — AZITHROMYCIN 250 MG PO TABS: 250.0000 mg | ORAL_TABLET | Freq: Every day | ORAL | Status: DC

## 2011-02-13 NOTE — ED Notes (Signed)
Respiratory Therapist called and will come to give albuterol inhaler.

## 2011-02-13 NOTE — Discharge Instructions (Signed)
 Bronchitis Bronchitis is the body's way of reacting to injury and/or infection (inflammation) of the bronchi. Bronchi are the air tubes that extend from the windpipe into the lungs. If the inflammation becomes severe, it may cause shortness of breath. CAUSES  Inflammation may be caused by:  A virus.   Germs (bacteria).   Dust.   Allergens.   Pollutants and many other irritants.  The cells lining the bronchial tree are covered with tiny hairs (cilia). These constantly beat upward, away from the lungs, toward the mouth. This keeps the lungs free of pollutants. When these cells become too irritated and are unable to do their job, mucus begins to develop. This causes the characteristic cough of bronchitis. The cough clears the lungs when the cilia are unable to do their job. Without either of these protective mechanisms, the mucus would settle in the lungs. Then you would develop pneumonia. Smoking is a common cause of bronchitis and can contribute to pneumonia. Stopping this habit is the single most important thing you can do to help yourself. TREATMENT   Your caregiver may prescribe an antibiotic if the cough is caused by bacteria. Also, medicines that open up your airways make it easier to breathe. Your caregiver may also recommend or prescribe an expectorant. It will loosen the mucus to be coughed up. Only take over-the-counter or prescription medicines for pain, discomfort, or fever as directed by your caregiver.   Removing whatever causes the problem (smoking, for example) is critical to preventing the problem from getting worse.   Cough suppressants may be prescribed for relief of cough symptoms.   Inhaled medicines may be prescribed to help with symptoms now and to help prevent problems from returning.   For those with recurrent (chronic) bronchitis, there may be a need for steroid medicines.  SEEK IMMEDIATE MEDICAL CARE IF:   During treatment, you develop more pus-like mucus  (purulent sputum).   You have a fever.   You become progressively more ill.   You have increased difficulty breathing, wheezing, or shortness of breath.  It is necessary to seek immediate medical care if you are elderly or sick from any other disease. MAKE SURE YOU:   Understand these instructions.   Will watch your condition.   Will get help right away if you are not doing well or get worse.  Document Released: 02/03/2005 Document Revised: 10/16/2010 Document Reviewed: 12/14/2007 [facility] Patient Information 2012 [place], [place].      Sinusitis Sinuses are air pockets within the bones of your face. The growth of bacteria within a sinus leads to infection. The infection prevents the sinuses from draining. This infection is called sinusitis. SYMPTOMS  There will be different areas of pain depending on which sinuses have become infected.  The maxillary sinuses often produce pain beneath the eyes.   Frontal sinusitis may cause pain in the middle of the forehead and above the eyes.  Other problems (symptoms) include:  Toothaches.   Colored, pus-like (purulent) drainage from the nose.   Swelling, warmth, and tenderness over the sinus areas may be signs of infection.  TREATMENT  Sinusitis is most often determined by an exam.X-rays may be taken. If x-rays have been taken, make sure you obtain your results or find out how you are to obtain them. Your caregiver may give you medications (antibiotics). These are medications that will help kill the bacteria causing the infection. You may also be given a medication (decongestant) that helps to reduce sinus swelling.  HOME CARE INSTRUCTIONS  Only take over-the-counter or prescription medicines for pain, discomfort, or fever as directed by your caregiver.   Drink extra fluids. Fluids help thin the mucus so your sinuses can drain more easily.   Applying either moist heat or ice packs to the sinus areas may help relieve discomfort.    Use saline nasal sprays to help moisten your sinuses. The sprays can be found at your local drugstore.  SEEK IMMEDIATE MEDICAL CARE IF:  You have a fever.   You have increasing pain, severe headaches, or toothache.   You have nausea, vomiting, or drowsiness.   You develop unusual swelling around the face or trouble seeing.  MAKE SURE YOU:   Understand these instructions.   Will watch your condition.   Will get help right away if you are not doing well or get worse.  Document Released: 02/03/2005 Document Revised: 10/16/2010 Document Reviewed: 09/02/2006 [facility] Patient Information 2012 [place], [place].

## 2011-02-13 NOTE — ED Notes (Signed)
Pt states runny nose, productive cough, and left sided chest pain since Friday. Pt reports chest pain is worsened with coughing but states sometimes even when she isn't coughing she has chest pain. Denies fevers. Pt with actively runny nose in triage.

## 2011-02-13 NOTE — ED Provider Notes (Signed)
History     CSN: [id number]  Arrival date & time 02/13/11  0900   First MD Initiated Contact with Patient 02/13/11 0913      Chief Complaint  Patient presents with  . Cough  . Nasal Congestion  . Chest Pain    (Consider location/radiation/quality/duration/timing/severity/associated sxs/prior treatment) HPI Comments: Pt reports getting sick about 3 weeks ago associated with mostly coughing.  Now has CP associated with deep breath and coughing only, not when she is still, worse with palpation of chest wall along side ribs, facial congestion and pain, runny nose, sore throat.  Pt denies fever, or overall achiness.  Did not have flu vaccine.  She smokes.  She denies N/V/D, back pain.  CP is not associated with palpitations, sweats, nausea, orthopnea or PND.  She has been trying mucinex with no relief.    Patient is a 70 y.o. female presenting with cough and chest pain. The history is provided by the patient.  Cough Associated symptoms include chest pain, headaches, rhinorrhea and sore throat. Pertinent negatives include no chills and no wheezing.  Chest Pain Primary symptoms include cough. Pertinent negatives for primary symptoms include no fever, no wheezing, no palpitations, no nausea and no vomiting.  Pertinent negatives for associated symptoms include no numbness and no weakness.     Past Medical History  Diagnosis Date  . CAD (coronary artery disease)     unspecified  . Cerebrovascular disease   . Hyperlipidemia, mixed   . HTN (hypertension)   . Ulcerative colitis   . Diverticulitis   . Colon polyps 2006  . Thyroid disease   . Myocardial infarct     Past Surgical History  Procedure Date  . Cholecystectomy 7.06.2008    with cholangiogram  . Coronary artery bypass graft 1.10.2006  . Abdominal hysterectomy     Family History  Problem Relation Age of Onset  . Colitis Brother     undefined  . Cancer Mother     stomach  . Stroke Father     died    History    Substance Use Topics  . Smoking status: Current Everyday Smoker -- 1.5 packs/day    Types: Cigarettes  . Smokeless tobacco: Not on file  . Alcohol Use: No    OB History    Grav Para Term Preterm Abortions TAB SAB Ect Mult Living                  Review of Systems  Constitutional: Negative for fever, chills and appetite change.  HENT: Positive for congestion, sore throat, rhinorrhea, postnasal drip and sinus pressure. Negative for trouble swallowing and voice change.   Respiratory: Positive for cough and chest tightness. Negative for wheezing and stridor.   Cardiovascular: Positive for chest pain. Negative for palpitations and leg swelling.  Gastrointestinal: Negative for nausea, vomiting and diarrhea.  Skin: Negative for rash.  Neurological: Positive for headaches. Negative for weakness and numbness.    Allergies  Ivp dye  Home Medications   Current Outpatient Rx  Name Route Sig Dispense Refill  . ASPIRIN EC 81 MG PO TBEC Oral Take 81 mg by mouth daily.      Marland Kitchen DM-GUAIFENESIN ER 30-600 MG PO TB12 Oral Take 1 tablet by mouth every 12 (twelve) hours as needed. For cough.congestion     . HYDROCHLOROTHIAZIDE 12.5 MG PO CAPS Oral Take 12.5 mg by mouth daily.      Marland Kitchen MESALAMINE 1.2 G PO TBEC Oral Take 4,800 mg by mouth  daily.      Marland Kitchen METOPROLOL TARTRATE 25 MG PO TABS Oral Take 25 mg by mouth 2 (two) times daily.      Marland Kitchen ROSUVASTATIN CALCIUM 40 MG PO TABS Oral Take 40 mg by mouth at bedtime.      Marland Kitchen VALSARTAN 160 MG PO TABS Oral Take 160 mg by mouth daily.      . AZITHROMYCIN 250 MG PO TABS Oral Take 1 tablet (250 mg total) by mouth daily. Take first 2 tablets together, then 1 every day until finished. 6 tablet 0  . HYDROCOD POLST-CHLORPHEN POLST 10-8 MG/5ML PO LQCR Oral Take 5 mLs by mouth every 12 (twelve) hours as needed (for cough and congestion). 80 mL 0    BP 107/73  Pulse 85  Temp(Src) 98.1 F (36.7 C) (Oral)  Resp 17  SpO2 100%  Physical Exam  Nursing note and vitals  reviewed. Constitutional: She is oriented to person, place, and time. She appears well-developed and well-nourished.  HENT:  Head: Normocephalic and atraumatic.  Nose: Mucosal edema, rhinorrhea and sinus tenderness present. No nasal deformity or septal deviation. No epistaxis. Right sinus exhibits maxillary sinus tenderness and frontal sinus tenderness. Left sinus exhibits maxillary sinus tenderness and frontal sinus tenderness.  Mouth/Throat: Uvula is midline, oropharynx is clear and moist and mucous membranes are normal.  Eyes: Pupils are equal, round, and reactive to light. No scleral icterus.  Neck: Neck supple.  Cardiovascular: Normal rate and regular rhythm.   Pulmonary/Chest: Effort normal. She has no wheezes.  Abdominal: Soft. There is no tenderness.  Neurological: She is alert and oriented to person, place, and time. She has normal strength. No cranial nerve deficit. GCS eye subscore is 4. GCS verbal subscore is 5. GCS motor subscore is 6.    ED Course  Procedures (including critical care time)  Labs Reviewed - No data to display Dg Chest Portable 1 View  02/13/2011  *RADIOLOGY REPORT*  Clinical Data: Chest pain.  Cough.  PORTABLE CHEST - 1 VIEW  Comparison: 01/28/2011 and multiple previous  Findings: Artifact overlies the chest.  There is been previous median sternotomy and CABG.  The patient is rotated towards the right.  There is mild chronic pulmonary scarring but no sign of active infiltrate, mass, effusion or collapse.  No sign of heart failure.  IMPRESSION: No active disease.  Chronic lung markings.  Previous CABG.  Original Report Authenticated By: Thomasenia Sales, M.D.     1. Sinusitis   2. Bronchitis     ECG at time 0904 AM shows rate of 88, sinus rhythm with PAC's.  Normal acis.  Normal axis.  Poor R wave progression seen V2 to V3.  Otherwise no ST or T wave changes and no sig change from ECG dated 08/23/06.   MDM  Pt with likely sinus infection coupled with allergies  as well as bronchitis due to her smoking.  PCXR pending.  Sats on RA are 95 to 99%, normal.  Will gvie inhaler, Z pak, tussionex.       10:18 AM  I reviewed PCXR.  Per radiologist, no acute infiltrates.  Pt feels somewhat improved, discharge to home and recommended close outpt follow up.    Gavin Pound. Glori Machnik, MD 02/13/11 1019

## 2011-02-23 ENCOUNTER — Other Ambulatory Visit: Payer: Self-pay | Admitting: Cardiology

## 2011-03-04 ENCOUNTER — Institutional Professional Consult (permissible substitution): Payer: Medicare Other | Admitting: Internal Medicine

## 2011-03-04 ENCOUNTER — Other Ambulatory Visit: Payer: Self-pay | Admitting: Gastroenterology

## 2011-03-06 ENCOUNTER — Other Ambulatory Visit: Payer: Self-pay | Admitting: Gastroenterology

## 2011-03-13 ENCOUNTER — Institutional Professional Consult (permissible substitution): Payer: Medicare Other | Admitting: Internal Medicine

## 2011-03-18 ENCOUNTER — Encounter (INDEPENDENT_AMBULATORY_CARE_PROVIDER_SITE_OTHER): Payer: Medicare Other | Admitting: *Deleted

## 2011-03-18 DIAGNOSIS — F17200 Nicotine dependence, unspecified, uncomplicated: Secondary | ICD-10-CM

## 2011-03-18 DIAGNOSIS — I2510 Atherosclerotic heart disease of native coronary artery without angina pectoris: Secondary | ICD-10-CM

## 2011-03-18 DIAGNOSIS — I679 Cerebrovascular disease, unspecified: Secondary | ICD-10-CM

## 2011-03-18 DIAGNOSIS — E785 Hyperlipidemia, unspecified: Secondary | ICD-10-CM

## 2011-03-18 DIAGNOSIS — I6529 Occlusion and stenosis of unspecified carotid artery: Secondary | ICD-10-CM

## 2011-03-18 DIAGNOSIS — I10 Essential (primary) hypertension: Secondary | ICD-10-CM

## 2011-03-27 ENCOUNTER — Encounter: Payer: Self-pay | Admitting: Internal Medicine

## 2011-03-27 ENCOUNTER — Ambulatory Visit (INDEPENDENT_AMBULATORY_CARE_PROVIDER_SITE_OTHER): Payer: Medicare Other | Admitting: Internal Medicine

## 2011-03-27 VITALS — BP 128/72 | HR 70 | Ht 69.0 in | Wt 226.6 lb

## 2011-03-27 DIAGNOSIS — F17200 Nicotine dependence, unspecified, uncomplicated: Secondary | ICD-10-CM

## 2011-03-27 DIAGNOSIS — R059 Cough, unspecified: Secondary | ICD-10-CM

## 2011-03-27 DIAGNOSIS — I2510 Atherosclerotic heart disease of native coronary artery without angina pectoris: Secondary | ICD-10-CM

## 2011-03-27 DIAGNOSIS — R05 Cough: Secondary | ICD-10-CM

## 2011-03-27 NOTE — Progress Notes (Signed)
03/27/11- 70 yoFsmoker seen at the kind request of Dr Bruna Potter because of cough. She describes onset of cough with a flu syndrome in December of 2012. She is back to her baseline level of chronic cough after an initial emergency room visit and antibiotic. She claims she's never been told of lung disease. Some rhinorrhea only since December. Admits exertional dyspnea is more active than routine daily living. Some wheeze supine, especially at night, but it does not usually wake her. Sputum is light yellow. Cough syrup helped initially. She recognizes little reflux and denies history of pneumonia. Does not get flu vaccine because she says the first time she ever got it it made her sick. Has not had pneumonia vaccine. Had MI in 2003 with CABG. Denies history of TB. Says she has reduced her smoking habit from a pack and a half a day down to 6 or 7 cigarettes a day. Married, retired from housekeeping.  ROS-see HPI Constitutional:   No-   weight loss, night sweats, fevers, chills, fatigue, lassitude. HEENT:   No-  headaches, difficulty swallowing, tooth/dental problems, sore throat,       No-  sneezing, itching, ear ache, nasal congestion, post nasal drip,  CV:  No-   chest pain, orthopnea, PND, swelling in lower extremities, anasarca, dizziness, palpitations Resp: Stable  shortness of breath with exertion or at rest.              +   productive cough,  + non-productive cough,  No- coughing up of blood.             + change in color of mucus.  No- wheezing.   Skin: No-   rash or lesions. GI:  No-   heartburn, indigestion, abdominal pain, nausea, vomiting, diarrhea,                 change in bowel habits, loss of appetite GU: No-   dysuria,  MS:  No-   joint pain or swelling.  No- decreased range of motion.  No- back pain. Neuro-     nothing unusual Psych:  No- change in mood or affect. No depression or anxiety.  No memory loss.  OBJ- Physical Exam General- Alert, Oriented, Affect-appropriate, Distress-  none acute, overweight Skin- rash-none, lesions- none, excoriation- none Lymphadenopathy- none Head- atraumatic            Eyes- Gross vision intact, PERRLA, conjunctivae and secretions clear            Ears- Hearing, canals-normal            Nose- Clear, no-Septal dev, mucus, polyps, erosion, perforation             Throat- Mallampati II , mucosa clear , drainage- none, tonsils- atrophic Neck- flexible , trachea midline, no stridor , thyroid nl, carotid no bruit Chest - symmetrical excursion , unlabored           Heart/CV- RRR , no murmur , no gallop  , no rub, nl s1 s2                           - JVD- none , edema- none, stasis changes- none, varices- none           Lung- few crackles, wheeze- none, cough- none , dullness-none, rub- none           Chest wall-  Abd- tender-no, distended-no, bowel sounds-present, HSM- no Br/ Gen/ Rectal- Not  done, not indicated Extrem- cyanosis- none, clubbing, none, atrophy- none, strength- nl Neuro- grossly intact to observation

## 2011-03-27 NOTE — Patient Instructions (Signed)
I do think it would be good to stop smoking now.  Sample Tudorza    1 puff twice daily- see if it seems to help shortness of breath and cough

## 2011-03-30 NOTE — Assessment & Plan Note (Addendum)
History of dyspnea in an overweight smoker with history of myocardial infarction and CABG. Now persistent cough, improved but not gone, after a flulike illness 6 weeks ago. This will be at least partly a post viral bronchitis, prolonged because of her smoking. Other possibilities are not yet excluded. Plan-initial trial of Tudorza while we encourage smoking cessation. On return anticipate scheduling PFT.

## 2011-03-30 NOTE — Assessment & Plan Note (Signed)
Hopefully we can get her motivated to stop completely.

## 2011-05-26 ENCOUNTER — Other Ambulatory Visit: Payer: Self-pay | Admitting: Cardiology

## 2011-06-24 ENCOUNTER — Other Ambulatory Visit: Payer: Self-pay | Admitting: Family Medicine

## 2011-06-24 DIAGNOSIS — Z1231 Encounter for screening mammogram for malignant neoplasm of breast: Secondary | ICD-10-CM

## 2011-07-21 ENCOUNTER — Ambulatory Visit
Admission: RE | Admit: 2011-07-21 | Discharge: 2011-07-21 | Disposition: A | Discharge status: Home / Self Care | Payer: Medicare Other | Source: Ambulatory Visit | Attending: Family Medicine | Admitting: Family Medicine

## 2011-07-21 DIAGNOSIS — Z1231 Encounter for screening mammogram for malignant neoplasm of breast: Secondary | ICD-10-CM

## 2011-08-06 ENCOUNTER — Ambulatory Visit: Payer: Medicare Other | Admitting: Vascular Surgery

## 2011-08-06 ENCOUNTER — Other Ambulatory Visit: Payer: Medicare Other

## 2011-08-22 ENCOUNTER — Other Ambulatory Visit: Payer: Self-pay | Admitting: Cardiology

## 2011-08-24 ENCOUNTER — Other Ambulatory Visit: Payer: Self-pay | Admitting: Cardiology

## 2011-10-29 ENCOUNTER — Other Ambulatory Visit: Payer: Self-pay | Admitting: Cardiology

## 2011-10-29 NOTE — Telephone Encounter (Signed)
Refilled diovan 

## 2011-12-05 ENCOUNTER — Other Ambulatory Visit: Payer: Self-pay | Admitting: Gastroenterology

## 2012-01-05 ENCOUNTER — Encounter: Payer: Self-pay | Admitting: Vascular Surgery

## 2012-02-23 ENCOUNTER — Other Ambulatory Visit: Payer: Self-pay | Admitting: *Deleted

## 2012-02-23 DIAGNOSIS — I6529 Occlusion and stenosis of unspecified carotid artery: Secondary | ICD-10-CM

## 2012-02-25 ENCOUNTER — Encounter: Payer: Self-pay | Admitting: Neurosurgery

## 2012-02-26 ENCOUNTER — Encounter: Payer: Self-pay | Admitting: Neurosurgery

## 2012-02-26 ENCOUNTER — Ambulatory Visit (INDEPENDENT_AMBULATORY_CARE_PROVIDER_SITE_OTHER): Payer: Medicare Other | Admitting: Vascular Surgery

## 2012-02-26 ENCOUNTER — Ambulatory Visit (INDEPENDENT_AMBULATORY_CARE_PROVIDER_SITE_OTHER): Payer: Medicare Other | Admitting: Neurosurgery

## 2012-02-26 VITALS — BP 112/67 | HR 56 | Resp 16 | Ht 69.0 in | Wt 224.0 lb

## 2012-02-26 DIAGNOSIS — Z48812 Encounter for surgical aftercare following surgery on the circulatory system: Secondary | ICD-10-CM

## 2012-02-26 DIAGNOSIS — I6529 Occlusion and stenosis of unspecified carotid artery: Secondary | ICD-10-CM

## 2012-02-26 NOTE — Progress Notes (Signed)
[facility] Carotid Office Note  CC: Carotid surveillance Referring Physician: Edilia Bo  History of Present Illness: 72 year old female patient of Dr. Edilia Bo followed for known carotid stenosis. The patient denies any signs or symptoms of CVA, TIA, amaurosis fugax or any neural deficit. The patient states she does have a history of thyroid problems and is currently looking for a new primary care physician.  Past Medical History  Diagnosis Date  . CAD (coronary artery disease)     unspecified  . Cerebrovascular disease   . Hyperlipidemia, mixed   . HTN (hypertension)   . Ulcerative colitis   . Diverticulitis   . Colon polyps 2006  . Thyroid disease   . Myocardial infarct   . Carotid artery occlusion     ROS: [x]  Positive   [ ]  Denies    General: [ ]  Weight loss, [ ]  Fever, [ ]  chills Neurologic: [ ]  Dizziness, [ ]  Blackouts, [ ]  Seizure [ ]  Stroke, [ ]  "Mini stroke", [ ]  Slurred speech, [ ]  Temporary blindness; [ ]  weakness in arms or legs, [ ]  Hoarseness Cardiac: [ ]  Chest pain/pressure, [ ]  Shortness of breath at rest [ ]  Shortness of breath with exertion, [ ]  Atrial fibrillation or irregular heartbeat Vascular: [ ]  Pain in legs with walking, [ ]  Pain in legs at rest, [ ]  Pain in legs at night,  [ ]  Non-healing ulcer, [ ]  Blood clot in vein/DVT,   Pulmonary: [ ]  Home oxygen, [ ]  Productive cough, [ ]  Coughing up blood, [ ]  Asthma,  [ ]  Wheezing Musculoskeletal:  [ ]  Arthritis, [ ]  Low back pain, [ ]  Joint pain Hematologic: [ ]  Easy Bruising, [ ]  Anemia; [ ]  Hepatitis Gastrointestinal: [ ]  Blood in stool, [ ]  Gastroesophageal Reflux/heartburn, [ ]  Trouble swallowing Urinary: [ ]  chronic Kidney disease, [ ]  on HD - [ ]  MWF or [ ]  TTHS, [ ]  Burning with urination, [ ]  Difficulty urinating Skin: [ ]  Rashes, [ ]  Wounds Psychological: [ ]  Anxiety, [ ]  Depression   Social History History  Substance Use Topics  . Smoking status: Current Every Day  Smoker -- 0.3 packs/day for 40 years    Types: Cigarettes  . Smokeless tobacco: Never Used  . Alcohol Use: No    Family History Family History  Problem Relation Age of Onset  . Colitis Brother     undefined  . Cancer Mother     stomach  . Stroke Father     died    Allergies  Allergen Reactions  . Ivp Dye (Iodinated Diagnostic Agents) Other (See Comments)    "almost passed out"    Current Outpatient Prescriptions  Medication Sig Dispense Refill  . aspirin EC 81 MG tablet Take 81 mg by mouth daily.        Marland Kitchen DIOVAN 160 MG tablet TAKE 1 TABLET DAILY  90 tablet  3  . hydrochlorothiazide (MICROZIDE) 12.5 MG capsule TAKE 1 CAPSULE ONCE DAILY  90 capsule  3  . LIALDA 1.2 G EC tablet take 4 tablets by mouth once daily  120 each  8  . metoprolol tartrate (LOPRESSOR) 25 MG tablet TAKE 1 TABLET TWICE A DAY  180 tablet  3  . rosuvastatin (CRESTOR) 40 MG tablet Take 40 mg by mouth at bedtime.          Physical Examination  Filed Vitals:   02/26/12 1406  BP: 112/67  Pulse: 56  Resp:  Body mass index is 33.08 kg/(m^2).  General:  WDWN in NAD Gait: Normal HEENT: WNL Eyes: Pupils equal Pulmonary: normal non-labored breathing , without Rales, rhonchi,  wheezing Cardiac: RRR, without  Murmurs, rubs or gallops; Abdomen: soft, NT, no masses Skin: no rashes, ulcers noted  Vascular Exam Pulses: 2+ radial pulses bilaterally Carotid bruits: Carotid pulses to auscultation no bruits are heard Extremities without ischemic changes, no Gangrene , no cellulitis; no open wounds;  Musculoskeletal: no muscle wasting or atrophy   Neurologic: A&O X 3; Appropriate Affect ; SENSATION: normal; MOTOR FUNCTION:  moving all extremities equally. Speech is fluent/normal  Non-Invasive Vascular Imaging CAROTID DUPLEX 02/26/2012  Right ICA 20 - 39 % stenosis Left ICA 40 - 59 % stenosis   ASSESSMENT/PLAN: Asymptomatic patient with unchanged carotid duplex since June 2012. Patient will followup in  one year with repeat carotid duplex. The patient's questions were encouraged and answered, she is in agreement with this plan.  Lauree Chandler ANP   Clinic MD: Darrick Penna

## 2012-02-26 NOTE — Progress Notes (Signed)
Carotid duplex performed @ [facility] 02/26/2012 

## 2012-02-27 ENCOUNTER — Encounter: Payer: Self-pay | Admitting: Cardiology

## 2012-02-27 ENCOUNTER — Ambulatory Visit (INDEPENDENT_AMBULATORY_CARE_PROVIDER_SITE_OTHER): Payer: Medicare Other | Admitting: Cardiology

## 2012-02-27 VITALS — BP 116/68 | HR 56 | Ht 69.0 in | Wt 221.0 lb

## 2012-02-27 DIAGNOSIS — I2510 Atherosclerotic heart disease of native coronary artery without angina pectoris: Secondary | ICD-10-CM

## 2012-02-27 NOTE — Patient Instructions (Addendum)
Your physician wants you to follow-up in: ONE YEAR WITH DR Shelda Pal will receive a reminder letter in the mail two months in advance. If you don't receive a letter, please call our office to schedule the follow-up appointment.   Your physician has requested that you have a lexiscan myoview. For further information please visit [email or web address] Please follow instruction sheet, as given.   Your physician recommends that you return for lab work WITH STRESS TEST  REFERRAL TO PCP

## 2012-02-27 NOTE — Assessment & Plan Note (Signed)
Continue aspirin and statin. Schedule Myoview for risk stratification. 

## 2012-02-27 NOTE — Progress Notes (Signed)
   HPI: [name] is a very pleasant female that I have seen in the past for coronary artery disease. She has had prior coronary artery bypass grafting in 2006. Her LV function is normal. Echocardiogram in July of 2008 revealed normal LV function, mild aortic insufficiency and mild tricuspid regurgitation. Her most recent Myoview was performed in June of 2011. There was no scar or ischemia and her ejection fraction was 76%. Patient also with cerebrovascular disease followed by vascular surgery. I last saw her in Nov 2012. Since then she has dyspnea with more extreme activities but not with routine activities. It is relieved with rest. There is no orthopnea, PND, pedal edema, palpitations or syncope. Note she does not have associated chest pain or dyspnea. She does not have exertional chest pain.   Current Outpatient Prescriptions  Medication Sig Dispense Refill  . aspirin EC 81 MG tablet Take 81 mg by mouth daily.        Marland Kitchen DIOVAN 160 MG tablet TAKE 1 TABLET DAILY  90 tablet  3  . hydrochlorothiazide (MICROZIDE) 12.5 MG capsule TAKE 1 CAPSULE ONCE DAILY  90 capsule  3  . LIALDA 1.2 G EC tablet take 4 tablets by mouth once daily  120 each  8  . metoprolol tartrate (LOPRESSOR) 25 MG tablet TAKE 1 TABLET TWICE A DAY  180 tablet  3  . rosuvastatin (CRESTOR) 40 MG tablet Take 40 mg by mouth at bedtime.           Past Medical History  Diagnosis Date  . CAD (coronary artery disease)     unspecified  . Cerebrovascular disease   . Hyperlipidemia, mixed   . HTN (hypertension)   . Ulcerative colitis   . Diverticulitis   . Colon polyps 2006  . Thyroid disease   . Myocardial infarct   . Carotid artery occlusion     Past Surgical History  Procedure Date  . Cholecystectomy 7.06.2008    with cholangiogram  . Coronary artery bypass graft 1.10.2006  . Abdominal hysterectomy   . Carotid endarterectomy     History   Social History  . Marital Status: Married    Spouse Name: N/A    Number of  Children: N/A  . Years of Education: N/A   Occupational History  . retired    Social History Main Topics  . Smoking status: Current Every Day Smoker -- 0.3 packs/day for 40 years    Types: Cigarettes  . Smokeless tobacco: Never Used  . Alcohol Use: No  . Drug Use: No  . Sexually Active: Not on file   Other Topics Concern  . Not on file   Social History Narrative  . No narrative on file    ROS: no fevers or chills, productive cough, hemoptysis, dysphasia, odynophagia, melena, hematochezia, dysuria, hematuria, rash, seizure activity, orthopnea, PND, pedal edema, claudication. Remaining systems are negative.  Physical Exam: Well-developed well-nourished in no acute distress.  Skin is warm and dry.  HEENT is normal.  Neck is supple.  Chest is clear to auscultation with normal expansion.  Cardiovascular exam is regular rate and rhythm.  Abdominal exam nontender or distended. No masses palpated. Extremities show no edema. neuro grossly intact  ECG sinus rhythm at a rate of 56. No ST changes. First degree AV block.

## 2012-02-27 NOTE — Assessment & Plan Note (Signed)
Patient counseled on discontinuing. 

## 2012-02-27 NOTE — Assessment & Plan Note (Signed)
Continue aspirin and statin. Followed by vascular surgery. 

## 2012-02-27 NOTE — Assessment & Plan Note (Signed)
Continue statin. Check lipids and liver. 

## 2012-02-27 NOTE — Assessment & Plan Note (Signed)
Blood pressure controlled. Continue present medications. Check potassium and renal function. 

## 2012-02-27 NOTE — Addendum Note (Signed)
Addended by: Sharee Pimple on: 02/27/2012 08:34 AM   Modules accepted: Orders

## 2012-03-02 ENCOUNTER — Encounter: Payer: Self-pay | Admitting: Cardiology

## 2012-03-04 ENCOUNTER — Ambulatory Visit (HOSPITAL_COMMUNITY): Payer: Medicare Other | Attending: Cardiology | Admitting: Radiology

## 2012-03-04 ENCOUNTER — Other Ambulatory Visit (INDEPENDENT_AMBULATORY_CARE_PROVIDER_SITE_OTHER): Payer: Medicare Other

## 2012-03-04 VITALS — Ht 69.0 in | Wt 224.0 lb

## 2012-03-04 DIAGNOSIS — R079 Chest pain, unspecified: Secondary | ICD-10-CM

## 2012-03-04 DIAGNOSIS — I2510 Atherosclerotic heart disease of native coronary artery without angina pectoris: Secondary | ICD-10-CM

## 2012-03-04 DIAGNOSIS — R0609 Other forms of dyspnea: Secondary | ICD-10-CM | POA: Insufficient documentation

## 2012-03-04 DIAGNOSIS — I10 Essential (primary) hypertension: Secondary | ICD-10-CM | POA: Insufficient documentation

## 2012-03-04 DIAGNOSIS — R0789 Other chest pain: Secondary | ICD-10-CM | POA: Insufficient documentation

## 2012-03-04 DIAGNOSIS — F17200 Nicotine dependence, unspecified, uncomplicated: Secondary | ICD-10-CM | POA: Insufficient documentation

## 2012-03-04 DIAGNOSIS — R0602 Shortness of breath: Secondary | ICD-10-CM

## 2012-03-04 DIAGNOSIS — R0989 Other specified symptoms and signs involving the circulatory and respiratory systems: Secondary | ICD-10-CM | POA: Insufficient documentation

## 2012-03-04 LAB — HEPATIC FUNCTION PANEL
AST: 20 U/L (ref 0–37)
Albumin: 3.5 g/dL (ref 3.5–5.2)
Alkaline Phosphatase: 57 U/L (ref 39–117)
Total Protein: 6.7 g/dL (ref 6.0–8.3)

## 2012-03-04 LAB — LIPID PANEL
Cholesterol: 139 mg/dL (ref 0–200)
HDL: 62.6 mg/dL (ref >39.00)
LDL Cholesterol: 65 mg/dL (ref 0–99)
Total CHOL/HDL Ratio: 2
Triglycerides: 58 mg/dL (ref 0.0–149.0)

## 2012-03-04 LAB — BASIC METABOLIC PANEL
CO2: 28 mEq/L (ref 19–32)
Calcium: 9.2 mg/dL (ref 8.4–10.5)
Chloride: 100 mEq/L (ref 96–112)
Creatinine, Ser: 1.1 mg/dL (ref 0.4–1.2)
Sodium: 136 mEq/L (ref 135–145)

## 2012-03-04 MED ORDER — TECHNETIUM TC 99M SESTAMIBI GENERIC - CARDIOLITE
33.0000 | Freq: Once | INTRAVENOUS | Status: AC | PRN
  Administered 2012-03-04: 33 via INTRAVENOUS

## 2012-03-04 MED ORDER — REGADENOSON 0.4 MG/5ML IV SOLN
0.4000 mg | Freq: Once | INTRAVENOUS | Status: AC
  Administered 2012-03-04: 0.4 mg via INTRAVENOUS

## 2012-03-04 MED ORDER — TECHNETIUM TC 99M SESTAMIBI GENERIC - CARDIOLITE
11.0000 | Freq: Once | INTRAVENOUS | Status: AC | PRN
  Administered 2012-03-04: 11 via INTRAVENOUS

## 2012-03-04 NOTE — Progress Notes (Signed)
[facility] SITE 3 NUCLEAR MED [address] [place], [place] [address] [phone]    Cardiology Nuclear Med Study  [name] is a 72 y.o. female     MRN : [id number]     DOB: 25-Nov-1940  Procedure Date: 03/04/2012  Nuclear Med Background Indication for Stress Test:  Evaluation for Ischemia and Graft Patency History: Abnormal EKG, '06 NSTEMI> Cath>CABG,'08 Echo: EF=60%, mild AR/TR, and 07-2009 Myocardial Perfusion Study-normal, EF=76% Cardiac Risk Factors: Carotid Disease, Hypertension, Lipids and Smoker  Symptoms: Chest Tightness with exertion (last occurrence couple months ago), Tightnes DOE, Fatigue and Fatigue with Exertion   Nuclear Pre-Procedure Caffeine/Decaff Intake:  None NPO After: 10:00pm   Lungs:  clear O2 Sat: 97-98% on room air. IV 0.9% NS with Angio Cath:  22g  IV Site: R Forearm  IV Started by:  Bonnita Levan, RN  Chest Size (in):  44 Cup Size: B  Height: 5\' 9"  (1.753 m)  Weight:  224 lb (101.606 kg)  BMI:  Body mass index is 33.08 kg/(m^2). Tech Comments:  Labs drawn    Nuclear Med Study 1 or 2 day study: 1 day  Stress Test Type:  Lexiscan  Reading MD: Marca Ancona, MD  Order Authorizing Provider:  Olga Millers, MD  Resting Radionuclide: Technetium 74m Sestamibi  Resting Radionuclide Dose: 11.0 mCi   Stress Radionuclide:  Technetium 70m Sestamibi  Stress Radionuclide Dose: 33.0 mCi           Stress Protocol Rest HR: 62 Stress HR: 71  Rest BP: 117/77 Stress BP: 117/66  Exercise Time (min): n/a METS: n/a   Predicted Max HR: 147 bpm % Max HR: 48.3 bpm Rate Pressure Product: 8307    Dose of Adenosine (mg):  n/a Dose of Lexiscan: 0.4 mg  Dose of Atropine (mg): n/a Dose of Dobutamine: n/a mcg/kg/min (at max HR)  Stress Test Technologist: Irean Hong, RN  Nuclear Technologist:  Domenic Polite, CNMT     Rest Procedure:  Myocardial perfusion imaging was performed at rest 45 minutes following the intravenous administration of Technetium  82m Sestamibi. Rest ECG: NSR - Normal EKG  Stress Procedure:  The patient received IV Lexiscan 0.4 mg over 15-seconds.  Technetium 42m Sestamibi injected at 30-seconds.  Quantitative spect images were obtained after a 45 minute delay. Stress ECG: No significant change from baseline ECG  QPS Raw Data Images:  Normal; no motion artifact; normal heart/lung ratio. Stress Images:  Normal homogeneous uptake in all areas of the myocardium. Rest Images:  Normal homogeneous uptake in all areas of the myocardium. Subtraction (SDS):  There is no evidence of scar or ischemia. Transient Ischemic Dilatation (Normal <1.22):  0.89 Lung/Heart Ratio (Normal <0.45):  0.28  Quantitative Gated Spect Images QGS EDV:  62 ml QGS ESV:  12 ml  Impression Exercise Capacity:  Lexiscan with no exercise. BP Response:  Hypotensive blood pressure response. Clinical Symptoms:  Dizziness.  ECG Impression:  No significant ST segment change suggestive of ischemia. Comparison with Prior Nuclear Study: No significant change from previous study  Overall Impression:  Normal stress nuclear study.  LV Ejection Fraction: 81%.  LV Wall Motion:  NL LV Function; NL Wall Motion  Marca Ancona 03/04/2012

## 2012-03-05 ENCOUNTER — Encounter: Payer: Self-pay | Admitting: *Deleted

## 2012-03-08 ENCOUNTER — Telehealth: Payer: Self-pay | Admitting: Cardiology

## 2012-03-08 NOTE — Telephone Encounter (Signed)
New problem:    Aware that [name] off today.     Was told to call the office back regarding test results.

## 2012-03-08 NOTE — Telephone Encounter (Signed)
Pt states that she received a message on her VM from Debra, Dr. Ludwig Clarks nurse, on Friday and she did not get home in time to call back. She states that Stanton Kidney has already given her stress test results so she thinks the call may have been to give her her lab results. Pt advised that her lab work was ok, she verbalized understanding and wants Stanton Kidney to call her back if there was anything else she needs to know. Will forward note to [employer].

## 2012-05-01 ENCOUNTER — Other Ambulatory Visit: Payer: Self-pay | Admitting: Cardiology

## 2012-06-29 ENCOUNTER — Encounter: Payer: Self-pay | Admitting: Gastroenterology

## 2012-07-26 ENCOUNTER — Other Ambulatory Visit: Payer: Self-pay | Admitting: Cardiology

## 2012-08-10 ENCOUNTER — Encounter: Payer: Self-pay | Admitting: Gastroenterology

## 2012-08-16 ENCOUNTER — Other Ambulatory Visit: Payer: Self-pay

## 2012-08-16 DIAGNOSIS — Z1231 Encounter for screening mammogram for malignant neoplasm of breast: Secondary | ICD-10-CM

## 2012-09-13 ENCOUNTER — Other Ambulatory Visit: Payer: Self-pay | Admitting: *Deleted

## 2012-09-13 MED ORDER — MESALAMINE 1.2 G PO TBEC: DELAYED_RELEASE_TABLET | ORAL | Status: DC

## 2012-09-14 ENCOUNTER — Ambulatory Visit
Admission: RE | Admit: 2012-09-14 | Discharge: 2012-09-14 | Disposition: A | Discharge status: Home / Self Care | Payer: Medicare Other | Source: Ambulatory Visit

## 2012-09-14 DIAGNOSIS — Z1231 Encounter for screening mammogram for malignant neoplasm of breast: Secondary | ICD-10-CM

## 2012-09-16 ENCOUNTER — Telehealth: Payer: Self-pay | Admitting: Gastroenterology

## 2012-09-16 MED ORDER — MESALAMINE 1.2 G PO TBEC: DELAYED_RELEASE_TABLET | ORAL | Status: DC

## 2012-10-11 ENCOUNTER — Ambulatory Visit (AMBULATORY_SURGERY_CENTER): Payer: Medicare Other

## 2012-10-11 VITALS — Ht 69.0 in | Wt 220.8 lb

## 2012-10-11 DIAGNOSIS — K5190 Ulcerative colitis, unspecified, without complications: Secondary | ICD-10-CM

## 2012-10-11 MED ORDER — SUPREP BOWEL PREP KIT 17.5-3.13-1.6 GM/177ML PO SOLN: 1.0000 | Freq: Once | ORAL | Status: DC

## 2012-10-14 ENCOUNTER — Other Ambulatory Visit: Payer: Self-pay | Admitting: *Deleted

## 2012-10-14 ENCOUNTER — Telehealth: Payer: Self-pay | Admitting: Cardiology

## 2012-10-14 MED ORDER — HYDROCHLOROTHIAZIDE 12.5 MG PO CAPS: 12.5000 mg | ORAL_CAPSULE | Freq: Every day | ORAL | Status: DC

## 2012-10-14 MED ORDER — METOPROLOL TARTRATE 25 MG PO TABS: 25.0000 mg | ORAL_TABLET | Freq: Two times a day (BID) | ORAL | Status: DC

## 2012-10-14 NOTE — Telephone Encounter (Signed)
New problem   Pt need to speak to nurse concerning prescriptions for Hydrochlorothiazde and Metoprolol 25mg  that she doesn't have any refills for. Please call pt

## 2012-10-25 ENCOUNTER — Other Ambulatory Visit: Payer: Self-pay | Admitting: Cardiology

## 2012-10-26 ENCOUNTER — Ambulatory Visit (AMBULATORY_SURGERY_CENTER): Payer: Medicare Other | Admitting: Gastroenterology

## 2012-10-26 ENCOUNTER — Encounter: Payer: Self-pay | Admitting: Gastroenterology

## 2012-10-26 VITALS — BP 147/92 | HR 53 | Temp 96.7°F | Resp 21 | Ht 69.0 in | Wt 220.0 lb

## 2012-10-26 DIAGNOSIS — K5190 Ulcerative colitis, unspecified, without complications: Secondary | ICD-10-CM

## 2012-10-26 DIAGNOSIS — D126 Benign neoplasm of colon, unspecified: Secondary | ICD-10-CM

## 2012-10-26 DIAGNOSIS — Z1211 Encounter for screening for malignant neoplasm of colon: Secondary | ICD-10-CM

## 2012-10-26 DIAGNOSIS — K5730 Diverticulosis of large intestine without perforation or abscess without bleeding: Secondary | ICD-10-CM

## 2012-10-26 MED ORDER — SODIUM CHLORIDE 0.9 % IV SOLN: 500.0000 mL | INTRAVENOUS | Status: DC

## 2012-10-26 NOTE — Progress Notes (Signed)
Patient did not experience any of the following events: a burn prior to discharge; a fall within the facility; wrong site/side/patient/procedure/implant event; or a hospital transfer or hospital admission upon discharge from the facility. (G8907) Patient did not have preoperative order for IV antibiotic SSI prophylaxis. (G8918)  

## 2012-10-26 NOTE — Progress Notes (Signed)
Called to room to assist during endoscopic procedure.  Patient ID and intended procedure confirmed with present staff. Received instructions for my participation in the procedure from the performing physician.Called to room to assist during endoscopic procedure.  Patient ID and intended procedure confirmed with present staff. Received instructions for my participation in the procedure from the performing physician. ewm 

## 2012-10-26 NOTE — Op Note (Signed)
[facility] [address] [place], [address]   COLONOSCOPY PROCEDURE REPORT  PATIENT: [name], [name]  MR#: [id number] BIRTHDATE: 1940-03-24 , 72  yrs. old GENDER: Female ENDOSCOPIST: Louis Meckel, MD REFERRED XB:JYNWG Blount, M.D. PROCEDURE DATE:  10/26/2012 PROCEDURE:   Colonoscopy with biopsy, h/o colon polyp  ASA CLASS:   Class II INDICATIONS: MEDICATIONS: MAC sedation, administered by CRNA and propofol (Diprivan) 100mg  IV  DESCRIPTION OF PROCEDURE:   After the risks benefits and alternatives of the procedure were thoroughly explained, informed consent was obtained.  A digital rectal exam revealed no abnormalities of the rectum.   The LB [id number]  endoscope was introduced through the anus and advanced to the cecum, which was identified by both the appendix and ileocecal valve. No adverse events experienced.   The quality of the prep was excellent using Suprep  The instrument was then slowly withdrawn as the colon was fully examined.      COLON FINDINGS: In the cecum, adjacent to the appendiceal orifice, there was a 2-3 mm area of prominent mucosa with central dimpling suggestive of an inverted diverticulum.  Biopsies were taken to rule out adenomatous changes. There are scattered diverticula in the sigmoid descending transverse and descending colon At approximately 20 cm from the anus there was a 2 cm area of colon with multiple areas of submucosal hemorrhage.  Biopsies were taken. The remainder of the sigmoid and rectum were normal.   In the cecum, adjacent to the appendiceal orifice, there was a 2-3 mm area of prominent mucosa with central dimpling suggestive of an inverted diverticulum.  Biopsies were taken to rule out adenomatous changes. There is scattered diverticula in the sigmoid descending transverse and descending colon At approximately 20 cm from the anus there was a 2 cm area of colon with multiple areas of submucosal  hemorrhage.  Biopsies were taken. The remainder of the sigmoid and rectum were normal.  Retroflexed views revealed no abnormalities. The time to cecum=2 minutes 24 seconds.  Withdrawal time=9 minutes 30 seconds.  The scope was withdrawn and the procedure completed. COMPLICATIONS: There were no complications.  ENDOSCOPIC IMPRESSION: 1.   focal colitis 2.  diverticulosis   RECOMMENDATIONS: Await pathology findings  eSigned:  Louis Meckel, MD 10/26/2012 11:36 AM   cc:   PATIENT NAME:  [name], [name] MR#: [id number]

## 2012-10-27 ENCOUNTER — Telehealth: Payer: Self-pay | Admitting: *Deleted

## 2012-10-27 NOTE — Telephone Encounter (Signed)
  Follow up Call-  Call back number 10/26/2012  Post procedure Call Back phone  # [phone]  Permission to leave phone message Yes     Patient questions:  Do you have a fever, pain , or abdominal swelling? no Pain Score  0 *  Have you tolerated food without any problems? yes  Have you been able to return to your normal activities? yes  Do you have any questions about your discharge instructions: Diet   no Medications  no Follow up visit  yes  Do you have questions or concerns about your Care? no  Actions: * If pain score is 4 or above: No action needed, pain <4.  Pt. Will await contact from Dr. Marzetta Board office for follow-up visit in several weeks.  Advised to call office by end of week if she has not heard from them.

## 2012-10-29 ENCOUNTER — Telehealth: Payer: Self-pay | Admitting: *Deleted

## 2012-10-29 NOTE — Telephone Encounter (Signed)
Message copied by Marlowe Kays on Fri Oct 29, 2012  2:22 PM ------      Message from: Darrel Hoover      Created: Tue Oct 26, 2012 12:25 PM      Regarding: f/u appt       Zella Ball,            As the patient was leaving today, Dr Arlyce Dice asked me message you to have her come back for f/u appt in 1 month.              Thanks ------

## 2012-11-04 ENCOUNTER — Encounter: Payer: Self-pay | Admitting: Gastroenterology

## 2012-11-11 ENCOUNTER — Telehealth: Payer: Self-pay | Admitting: Gastroenterology

## 2012-11-11 MED ORDER — MESALAMINE 1.2 G PO TBEC: DELAYED_RELEASE_TABLET | ORAL | Status: DC

## 2012-11-11 NOTE — Telephone Encounter (Signed)
Refilled Lialda 

## 2012-12-03 ENCOUNTER — Other Ambulatory Visit (INDEPENDENT_AMBULATORY_CARE_PROVIDER_SITE_OTHER): Payer: Medicare Other

## 2012-12-03 ENCOUNTER — Encounter: Payer: Self-pay | Admitting: Gastroenterology

## 2012-12-03 ENCOUNTER — Ambulatory Visit (INDEPENDENT_AMBULATORY_CARE_PROVIDER_SITE_OTHER): Payer: Medicare Other | Admitting: Gastroenterology

## 2012-12-03 VITALS — BP 118/68 | HR 68 | Ht 69.0 in | Wt 220.8 lb

## 2012-12-03 DIAGNOSIS — K5190 Ulcerative colitis, unspecified, without complications: Secondary | ICD-10-CM

## 2012-12-03 DIAGNOSIS — Z8719 Personal history of other diseases of the digestive system: Secondary | ICD-10-CM

## 2012-12-03 LAB — CBC WITH DIFFERENTIAL/PLATELET
Basophils Absolute: 0 10*3/uL (ref 0.0–0.1)
HCT: 44 % (ref 36.0–46.0)
Hemoglobin: 14.6 g/dL (ref 12.0–15.0)
Lymphs Abs: 2.3 10*3/uL (ref 0.7–4.0)
MCHC: 33.3 g/dL (ref 30.0–36.0)
MCV: 96.9 fl (ref 78.0–100.0)
Monocytes Absolute: 0.5 10*3/uL (ref 0.1–1.0)
Monocytes Relative: 6.8 % (ref 3.0–12.0)
Neutro Abs: 4.3 10*3/uL (ref 1.4–7.7)
RDW: 14.5 % (ref 11.5–14.6)

## 2012-12-03 LAB — COMPREHENSIVE METABOLIC PANEL
ALT: 13 U/L (ref 0–35)
AST: 19 U/L (ref 0–37)
Alkaline Phosphatase: 57 U/L (ref 39–117)
BUN: 14 mg/dL (ref 6–23)
Creatinine, Ser: 0.9 mg/dL (ref 0.4–1.2)
GFR: 79.67 mL/min (ref >60.00)
Glucose, Bld: 99 mg/dL (ref 70–99)
Potassium: 4.4 mEq/L (ref 3.5–5.1)
Total Bilirubin: 0.5 mg/dL (ref 0.3–1.2)

## 2012-12-03 MED ORDER — MESALAMINE 1.2 G PO TBEC: DELAYED_RELEASE_TABLET | ORAL | Status: DC

## 2012-12-03 NOTE — Patient Instructions (Addendum)
You have been given a separate informational sheet regarding your tobacco use, the importance of quitting and local resources to help you quit. Go to the basement for labs Follow up in one year

## 2012-12-03 NOTE — Assessment & Plan Note (Signed)
Patient remains in clinical remission on Lialda.  Plan to reduce to 2.4 grams daily

## 2012-12-03 NOTE — Progress Notes (Signed)
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  On Lialda 4.8 g a day she remains in clinical remission.  Recent colonoscopy demonstrated mild inflammatory changes in the left colon in an area of diverticulosis.  She has no GI complaints including abdominal pain or diarrhea.    Review of Systems: She complains of left knee pain Pertinent positive and negative review of systems were noted in the above HPI section. All other review of systems were otherwise negative.    Current Medications, Allergies, Past Medical History, Past Surgical History, Family History and Social History were reviewed in [employer] electronic medical record  Vital signs were reviewed in today's medical record. Physical Exam: General: Well developed , well nourished, no acute distress Skin: anicteric Head: Normocephalic and atraumatic Eyes:  sclerae anicteric, EOMI Ears: Normal auditory acuity Mouth: No deformity or lesions Lungs: Clear throughout to auscultation Heart: Regular rate and rhythm; no murmurs, rubs or bruits Abdomen: Soft, non tender and non distended. No masses, hepatosplenomegaly or hernias noted. Normal Bowel sounds Rectal:deferred Musculoskeletal: Symmetrical with no gross deformities  Pulses:  Normal pulses noted Extremities: No clubbing, cyanosis, edema or deformities noted Neurological: Alert oriented x 4, grossly nonfocal Psychological:  Alert and cooperative. Normal mood and affect

## 2012-12-23 ENCOUNTER — Telehealth: Payer: Self-pay | Admitting: Gastroenterology

## 2013-01-06 ENCOUNTER — Telehealth: Payer: Self-pay | Admitting: Gastroenterology

## 2013-01-06 NOTE — Telephone Encounter (Signed)
Left message for patient to return my call If she has any questions still regarding her Lialda

## 2013-01-06 NOTE — Telephone Encounter (Signed)
Pt states that she started taking half the dosage of Lialda as instructed on 12/05/12. Pt states that she has started having some blood in her stool and wants to know if she should start taking 4 pills a day again instead of 2 a day. Please advise.

## 2013-01-07 MED ORDER — MESALAMINE 1.2 G PO TBEC: DELAYED_RELEASE_TABLET | ORAL | Status: DC

## 2013-01-07 NOTE — Telephone Encounter (Signed)
Script sent to pharmacy.

## 2013-01-07 NOTE — Addendum Note (Signed)
Addended by: Selinda Michaels R on: 01/07/2013 11:02 AM   Modules accepted: Orders

## 2013-01-07 NOTE — Telephone Encounter (Signed)
Pt aware and pharmacy notified.

## 2013-01-07 NOTE — Telephone Encounter (Signed)
Yes, increase lialda to 4.8 g daily

## 2013-01-27 ENCOUNTER — Other Ambulatory Visit: Payer: Self-pay

## 2013-01-27 MED ORDER — METOPROLOL TARTRATE 25 MG PO TABS: 25.0000 mg | ORAL_TABLET | Freq: Two times a day (BID) | ORAL | Status: DC

## 2013-01-27 MED ORDER — HYDROCHLOROTHIAZIDE 12.5 MG PO CAPS: 12.5000 mg | ORAL_CAPSULE | Freq: Every day | ORAL | Status: DC

## 2013-02-23 ENCOUNTER — Encounter: Payer: Self-pay | Admitting: Family

## 2013-02-24 ENCOUNTER — Encounter: Payer: Self-pay | Admitting: Family

## 2013-02-24 ENCOUNTER — Telehealth: Payer: Self-pay | Admitting: *Deleted

## 2013-02-24 ENCOUNTER — Ambulatory Visit (INDEPENDENT_AMBULATORY_CARE_PROVIDER_SITE_OTHER): Payer: Medicare Other | Admitting: Family

## 2013-02-24 ENCOUNTER — Ambulatory Visit (HOSPITAL_COMMUNITY)
Admission: RE | Admit: 2013-02-24 | Discharge: 2013-02-24 | Disposition: A | Discharge status: Home / Self Care | Payer: Medicare Other | Source: Ambulatory Visit | Attending: Family | Admitting: Family

## 2013-02-24 DIAGNOSIS — Z48812 Encounter for surgical aftercare following surgery on the circulatory system: Secondary | ICD-10-CM | POA: Insufficient documentation

## 2013-02-24 DIAGNOSIS — I6529 Occlusion and stenosis of unspecified carotid artery: Secondary | ICD-10-CM

## 2013-02-24 DIAGNOSIS — E049 Nontoxic goiter, unspecified: Secondary | ICD-10-CM

## 2013-02-24 NOTE — Progress Notes (Signed)
Established Carotid Patient   History of Present Illness  [name] is a 73 y.o. female patient of Dr. Scot Dock followed for known carotid stenosis. She has not had any carotid artery intervention. She denies any history of stroke or TIA, denies symptoms referable to claudication, denies non-healing wounds.  He had a 3 vessel CABG in 2006 which includes LIMA.   Patient denies New Medical or Surgical History.  Pt Diabetic: No, but is uncertain if she is borderline Pt smoker: former smoker, quit 4 days ago, started smoking in her teens, states she is determined to quit smoking.  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 CAD (coronary artery disease)     unspecified  . Cerebrovascular disease   . Hyperlipidemia, mixed   . HTN (hypertension)   . Ulcerative colitis   . Diverticulitis   . Colon polyps 2006  . Thyroid disease   . Myocardial infarct   . Carotid artery occlusion     Social History History  Substance Use Topics  . Smoking status: Current Every Day Smoker -- 0.30 packs/day for 40 years    Types: Cigarettes  . Smokeless tobacco: Never Used  . Alcohol Use: No    Family History Family History  Problem Relation Age of Onset  . Colitis Brother     undefined  . Cancer Mother     stomach  . Stroke Father     died  . Colon cancer Neg Hx     Surgical History Past Surgical History  Procedure Laterality Date  . Cholecystectomy  7.06.2008    with cholangiogram  . 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 Abdominal hysterectomy    . Carotid endarterectomy      Allergies  Allergen Reactions  . Ivp Dye [Iodinated Diagnostic Agents] Other (See Comments)    "almost passed out"    Current Outpatient Prescriptions  Medication Sig  Dispense Refill  . aspirin EC 81 MG tablet Take 81 mg by mouth daily.        . CRESTOR 40 MG tablet TAKE 1 TABLET AT BEDTIME  90 tablet  3  . hydrochlorothiazide (MICROZIDE) 12.5 MG capsule Take 1 capsule (12.5 mg total) by mouth daily.  90 capsule  3  . mesalamine (LIALDA) 1.2 G EC tablet take 2 tablets by mouth once daily  120 tablet  0  . mesalamine (LIALDA) 1.2 G EC tablet take 4 tablets by mouth once daily  120 tablet  6  . metoprolol tartrate (LOPRESSOR) 25 MG tablet Take 1 tablet (25 mg total) by mouth 2 (two) times daily.  180 tablet  3  . valsartan (DIOVAN) 160 MG tablet TAKE 1 TABLET DAILY  90 tablet  2   No current facility-administered medications for this visit.    Review of Systems : See HPI for pertinent positives and negatives.  Physical Examination  Filed Vitals:   02/24/13 1532  BP: 125/60  Pulse: 51  Resp: 14   Filed Weights   02/24/13 1532  Weight: 222 lb (100.699 kg)   Body mass index is 32.32 kg/(m^2).  General: WDWN obese female in NAD, hoarse voice. GAIT: normal Eyes: PERRLA Pulmonary:  CTAB but air movement is diminished in all fields, Negative  Rales, Negative rhonchi, & Negative wheezing. Chronic  moist cough.  Cardiac: Irregular Rhythm,  No detected Murmur.  VASCULAR EXAM Carotid Bruits Left Right   Negative Negative     Radial pulses are 2+ palpable and equal.                                                                                                                            LE Pulses LEFT RIGHT       POPLITEAL  not palpable   not palpable       POSTERIOR TIBIAL  not palpable   not palpable        DORSALIS PEDIS      ANTERIOR TIBIAL not palpable  not palpable     Gastrointestinal: soft, nontender, BS WNL, no r/g,  negative masses.  Musculoskeletal: Negative muscle atrophy/wasting. M/S 5/5 in U/E's, 3/5 in LE's, Extremities without ischemic changes.  Neurologic: A&O X 3; Appropriate Affect ; SENSATION ;normal;  Speech is  normal CN 2-12 intact, Pain and light touch intact in extremities, Motor exam as listed above.   Non-Invasive Vascular Imaging CAROTID DUPLEX 02/24/2013   Right ICA: <40% stenosis. Left ICA: <40% stenosis.  Previous carotid studies demonstrated: RICA <45% stenosis, LICA 40 - 59 % stenosis.  These findings are Improved from previous exam in the left ICA.  Assessment: [name] is a 73 y.o. female who presents with asymptomatic minimal plaque in the right ICA and mild/moderate left ICA stenosis. The  ICA stenosis is  Improved in the left ICA from previous exam, right ICA is stable.  She has irregular heart rhythm with no history of cardiac arrthymia, denies dizziness other than if she gets up quickly, denies chest pain.  Patient was advised to call Dr. Jacalyn Lefevre office tomorrow to let him know of this, my office notes will be faxed to Dr. Jacalyn Lefevre office this evening, she states that she has an appointment with him next week . I spoke with Altha Harm, triage nurse in Dr. Jacalyn Lefevre office, re her asymptomatic irregular cardiac rhythm with no documented history of arrythmia.    Plan: Follow-up in 1 year with Carotid Duplex scan.  The patient was advised to call 911 should she experience chest pain, shortness of breath, or feeling of heart palpitations.  I discussed in depth with the patient the nature of atherosclerosis, and emphasized the importance of maximal medical management including strict control of blood pressure, blood glucose, and lipid levels, obtaining regular exercise, and cessation of smoking.  The patient is aware that without maximal medical management the underlying atherosclerotic disease process will progress, limiting the benefit of any interventions.  The patient was given information about stroke prevention and what symptoms should prompt the patient to seek immediate medical care. Thank you for allowing [place] to participate in this patient's care.  Clemon Chambers,  RN, MSN, FNP-C [facility] Office: [facility] Physician: Oneida Alar  02/24/2013 1:52 PM

## 2013-02-24 NOTE — Patient Instructions (Addendum)
Stroke Prevention Some medical conditions and behaviors are associated with an increased chance of having a stroke. You may prevent a stroke by making healthy choices and managing medical conditions. Reduce your risk of having a stroke by:  Staying physically active. Get at least 30 minutes of activity on most or all days.  Not smoking. It may also be helpful to avoid exposure to secondhand smoke.  Limiting alcohol use. Moderate alcohol use is considered to be:  No more than 2 drinks per day for men.  No more than 1 drink per day for nonpregnant women.  Eating healthy foods.  Include 5 or more servings of fruits and vegetables a day.  Certain diets may be prescribed to address high blood pressure, high cholesterol, diabetes, or obesity.  Managing your cholesterol levels.  A low-saturated fat, low-trans fat, low-cholesterol, and high-fiber diet may control cholesterol levels.  Take any prescribed medicines to control cholesterol as directed by your caregiver.  Managing your diabetes.  A controlled-carbohydrate, controlled-sugar diet is recommended to manage diabetes.  Take any prescribed medicines to control diabetes as directed by your caregiver.  Controlling your high blood pressure (hypertension).  A low-salt (sodium), low-saturated fat, low-trans fat, and low-cholesterol diet is recommended to manage high blood pressure.  Take any prescribed medicines to control hypertension as directed by your caregiver.  Maintaining a healthy weight.  A reduced-calorie, low-sodium, low-saturated fat, low-trans fat, low-cholesterol diet is recommended to manage weight.  Stopping drug abuse.  Avoiding birth control pills.  Talk to your caregiver about the risks of taking birth control pills if you are over 81 years old, smoke, get migraines, or have ever had a blood clot.  Getting evaluated for sleep disorders (sleep apnea).  Talk to your caregiver about getting a sleep evaluation  if you snore a lot or have excessive sleepiness.  Taking medicines as directed by your caregiver.  For some people, aspirin or blood thinners (anticoagulants) are helpful in reducing the risk of forming abnormal blood clots that can lead to stroke. If you have the irregular heart rhythm of atrial fibrillation, you should be on a blood thinner unless there is a good reason you cannot take them.  Understand all your medicine instructions. SEEK IMMEDIATE MEDICAL CARE IF:   You have sudden weakness or numbness of the face, arm, or leg, especially on one side of the body.  You have sudden confusion.  You have trouble speaking (aphasia) or understanding.  You have sudden trouble seeing in one or both eyes.  You have sudden trouble walking.  You have dizziness.  You have a loss of balance or coordination.  You have a sudden, severe headache with no known cause.  You have new chest pain or an irregular heartbeat. Any of these symptoms may represent a serious problem that is an emergency. Do not wait to see if the symptoms will go away. Get medical help right away. Call your local emergency services (911 in [place] Do not drive yourself to the hospital. Document Released: 03/13/2004 Document Revised: 04/28/2011 Document Reviewed: 08/06/2012 [facility] Patient Information 2014 [place].  Smoking Cessation Quitting smoking is important to your health and has many advantages. However, it is not always easy to quit since nicotine is a very addictive drug. Often times, people try 3 times or more before being able to quit. This document explains the best ways for you to prepare to quit smoking. Quitting takes hard work and a lot of effort, but you can do it.  ADVANTAGES OF QUITTING SMOKING  You will live longer, feel better, and live better.  Your body will feel the impact of quitting smoking almost immediately.  Within 20 minutes, blood pressure decreases. Your pulse returns to its normal  level.  After 8 hours, carbon monoxide levels in the blood return to normal. Your oxygen level increases.  After 24 hours, the chance of having a heart attack starts to decrease. Your breath, hair, and body stop smelling like smoke.  After 48 hours, damaged nerve endings begin to recover. Your sense of taste and smell improve.  After 72 hours, the body is virtually free of nicotine. Your bronchial tubes relax and breathing becomes easier.  After 2 to 12 weeks, lungs can hold more air. Exercise becomes easier and circulation improves.  The risk of having a heart attack, stroke, cancer, or lung disease is greatly reduced.  After 1 year, the risk of coronary heart disease is cut in half.  After 5 years, the risk of stroke falls to the same as a nonsmoker.  After 10 years, the risk of lung cancer is cut in half and the risk of other cancers decreases significantly.  After 15 years, the risk of coronary heart disease drops, usually to the level of a nonsmoker.  If you are pregnant, quitting smoking will improve your chances of having a healthy baby.  The people you live with, especially any children, will be healthier.  You will have extra money to spend on things other than cigarettes. QUESTIONS TO THINK ABOUT BEFORE ATTEMPTING TO QUIT You may want to talk about your answers with your caregiver.  Why do you want to quit?  If you tried to quit in the past, what helped and what did not?  What will be the most difficult situations for you after you quit? How will you plan to handle them?  Who can help you through the tough times? Your family? Friends? A caregiver?  What pleasures do you get from smoking? What ways can you still get pleasure if you quit? Here are some questions to ask your caregiver:  How can you help me to be successful at quitting?  What medicine do you think would be best for me and how should I take it?  What should I do if I need more help?  What is  smoking withdrawal like? How can I get information on withdrawal? GET READY  Set a quit date.  Change your environment by getting rid of all cigarettes, ashtrays, matches, and lighters in your home, car, or work. Do not let people smoke in your home.  Review your past attempts to quit. Think about what worked and what did not. GET SUPPORT AND ENCOURAGEMENT You have a better chance of being successful if you have help. You can get support in many ways.  Tell your family, friends, and co-workers that you are going to quit and need their support. Ask them not to smoke around you.  Get individual, group, or telephone counseling and support. Programs are available at [employer] and health centers. Call your local health department for information about programs in your area.  Spiritual beliefs and practices may help some smokers quit.  Download a "quit meter" on your computer to keep track of quit statistics, such as how long you have gone without smoking, cigarettes not smoked, and money saved.  Get a self-help book about quitting smoking and staying off of tobacco. LEARN NEW SKILLS AND BEHAVIORS  Distract yourself from  urges to smoke. Talk to someone, go for a walk, or occupy your time with a task.  Change your normal routine. Take a different route to work. Drink tea instead of coffee. Eat breakfast in a different place.  Reduce your stress. Take a hot bath, exercise, or read a book.  Plan something enjoyable to do every day. Reward yourself for not smoking.  Explore interactive web-based programs that specialize in helping you quit. GET MEDICINE AND USE IT CORRECTLY Medicines can help you stop smoking and decrease the urge to smoke. Combining medicine with the above behavioral methods and support can greatly increase your chances of successfully quitting smoking.  Nicotine replacement therapy helps deliver nicotine to your body without the negative effects and risks of smoking.  Nicotine replacement therapy includes nicotine gum, lozenges, inhalers, nasal sprays, and skin patches. Some may be available over-the-counter and others require a prescription.  Antidepressant medicine helps people abstain from smoking, but how this works is unknown. This medicine is available by prescription.  Nicotinic receptor partial agonist medicine simulates the effect of nicotine in your brain. This medicine is available by prescription. Ask your caregiver for advice about which medicines to use and how to use them based on your health history. Your caregiver will tell you what side effects to look out for if you choose to be on a medicine or therapy. Carefully read the information on the package. Do not use any other product containing nicotine while using a nicotine replacement product.  RELAPSE OR DIFFICULT SITUATIONS Most relapses occur within the first 3 months after quitting. Do not be discouraged if you start smoking again. Remember, most people try several times before finally quitting. You may have symptoms of withdrawal because your body is used to nicotine. You may crave cigarettes, be irritable, feel very hungry, cough often, get headaches, or have difficulty concentrating. The withdrawal symptoms are only temporary. They are strongest when you first quit, but they will go away within 10 14 days. To reduce the chances of relapse, try to:  Avoid drinking alcohol. Drinking lowers your chances of successfully quitting.  Reduce the amount of caffeine you consume. Once you quit smoking, the amount of caffeine in your body increases and can give you symptoms, such as a rapid heartbeat, sweating, and anxiety.  Avoid smokers because they can make you want to smoke.  Do not let weight gain distract you. Many smokers will gain weight when they quit, usually less than 10 pounds. Eat a healthy diet and stay active. You can always lose the weight gained after you quit.  Find ways to improve  your mood other than smoking. FOR MORE INFORMATION  www.smokefree.gov  Document Released: 01/28/2001 Document Revised: 08/05/2011 Document Reviewed: 05/15/2011 [facility] Patient Information 2014 [place], [place].

## 2013-02-24 NOTE — Telephone Encounter (Signed)
SUZANNE NICKEL NP  FROM [facility]  CALLED  STATING   PT  IS OUT OF RHYTHM NO  SYMPTOMS  AT THIS TIME AND ON    NO  ANTI COAG  THERAPY    IS ON METOPROLOL .THIS  IS NEW  FOR  PT .  PT  INSTRUCTED  BY SUZANNE  IF  DEVELOPS  S/S  TO  GO TO ER  FOR  EVAL  AND  [place] HAS APPT  NEXT FRI  03-04-13 WITH  DR  CRENSHAW./CY

## 2013-02-25 ENCOUNTER — Ambulatory Visit (INDEPENDENT_AMBULATORY_CARE_PROVIDER_SITE_OTHER): Payer: Medicare Other | Admitting: *Deleted

## 2013-02-25 ENCOUNTER — Encounter: Payer: Self-pay | Admitting: Cardiology

## 2013-02-25 ENCOUNTER — Telehealth: Payer: Self-pay | Admitting: Cardiology

## 2013-02-25 VITALS — BP 102/67 | HR 53 | Ht 71.0 in | Wt 218.5 lb

## 2013-02-25 DIAGNOSIS — I2510 Atherosclerotic heart disease of native coronary artery without angina pectoris: Secondary | ICD-10-CM

## 2013-02-25 NOTE — Progress Notes (Signed)
Pt in for an EKG. The [facility] did a carotid scan yesterday in heir office, they called this office stating that  they think pt is out of rhythm. EKG done per nurse and to be   read per Dr. Stanford Breed. Pt's EKG reads that pt is in sinus bradycardia with 1 st degree heart block rate 53 beats/minute. BP right arm 102/67 left arm 94/56. Pt has no C/O. Per Hilda Blades RN  Will give the  EKG to MD for reading. Pt sent home in satisfactory condition.

## 2013-02-25 NOTE — Telephone Encounter (Signed)
New Problem:  Pt states [facility] was [facility] something to our office about her... PT states she would like to know what it was... PT is requesting a call back from the nurse.

## 2013-02-25 NOTE — Telephone Encounter (Signed)
Dr Stanford Breed aware, pt to come for EKG today

## 2013-02-25 NOTE — Telephone Encounter (Signed)
Spoke with pt, aware [facility] thinks the pt was out of rhythm yesterday when she had a scan. Per dr Stanford Breed, the pt will come in for an EKG. Pt will try to get a ride and come to the office today

## 2013-02-28 ENCOUNTER — Telehealth: Payer: Self-pay | Admitting: Gastroenterology

## 2013-02-28 MED ORDER — HYDROCORTISONE ACE-PRAMOXINE 1-1 % RE FOAM: 1.0000 | Freq: Every day | RECTAL | Status: DC

## 2013-02-28 NOTE — Telephone Encounter (Signed)
Pt states she was told to decrease her Lialda to 2 pills a day several months ago and she started to have rectal bleeding. Pt called at that time and was told to go back up to 4 pills/day. Pt states she is still taking the 4 pills/day but she has started to have some bright red blood in her stool again. Please advise.

## 2013-02-28 NOTE — Telephone Encounter (Signed)
Begin ProctoFoam one each bedtime.  Call back in one week

## 2013-02-28 NOTE — Telephone Encounter (Signed)
Pt aware and script sent to the pharmacy. 

## 2013-03-04 ENCOUNTER — Other Ambulatory Visit: Payer: Self-pay | Admitting: *Deleted

## 2013-03-04 ENCOUNTER — Ambulatory Visit (INDEPENDENT_AMBULATORY_CARE_PROVIDER_SITE_OTHER): Payer: Medicare Other | Admitting: Cardiology

## 2013-03-04 ENCOUNTER — Encounter: Payer: Self-pay | Admitting: Cardiology

## 2013-03-04 VITALS — BP 146/75 | HR 60 | Ht 69.5 in | Wt 223.0 lb

## 2013-03-04 DIAGNOSIS — E785 Hyperlipidemia, unspecified: Secondary | ICD-10-CM

## 2013-03-04 DIAGNOSIS — I679 Cerebrovascular disease, unspecified: Secondary | ICD-10-CM

## 2013-03-04 DIAGNOSIS — J40 Bronchitis, not specified as acute or chronic: Secondary | ICD-10-CM

## 2013-03-04 DIAGNOSIS — I2510 Atherosclerotic heart disease of native coronary artery without angina pectoris: Secondary | ICD-10-CM

## 2013-03-04 DIAGNOSIS — I10 Essential (primary) hypertension: Secondary | ICD-10-CM

## 2013-03-04 DIAGNOSIS — E876 Hypokalemia: Secondary | ICD-10-CM

## 2013-03-04 LAB — HEPATIC FUNCTION PANEL
ALK PHOS: 61 U/L (ref 39–117)
ALT: 15 U/L (ref 0–35)
AST: 17 U/L (ref 0–37)
Albumin: 3.6 g/dL (ref 3.5–5.2)
BILIRUBIN DIRECT: 0.1 mg/dL (ref 0.0–0.3)
Total Bilirubin: 0.7 mg/dL (ref 0.3–1.2)
Total Protein: 6.7 g/dL (ref 6.0–8.3)

## 2013-03-04 LAB — LIPID PANEL
Cholesterol: 125 mg/dL (ref 0–200)
HDL: 60.5 mg/dL (ref >39.00)
LDL CALC: 51 mg/dL (ref 0–99)
Total CHOL/HDL Ratio: 2
Triglycerides: 69 mg/dL (ref 0.0–149.0)
VLDL: 13.8 mg/dL (ref 0.0–40.0)

## 2013-03-04 LAB — BASIC METABOLIC PANEL
BUN: 16 mg/dL (ref 6–23)
CHLORIDE: 102 meq/L (ref 96–112)
CO2: 30 mEq/L (ref 19–32)
CREATININE: 1.1 mg/dL (ref 0.4–1.2)
Calcium: 9.1 mg/dL (ref 8.4–10.5)
GFR: 65.79 mL/min (ref >60.00)
Glucose, Bld: 93 mg/dL (ref 70–99)
Potassium: 3.4 mEq/L — ABNORMAL LOW (ref 3.5–5.1)
Sodium: 138 mEq/L (ref 135–145)

## 2013-03-04 LAB — HEMOGLOBIN A1C: Hgb A1c MFr Bld: 6.5 % (ref 4.6–6.5)

## 2013-03-04 MED ORDER — AZITHROMYCIN 250 MG PO TABS: ORAL_TABLET | ORAL | Status: DC

## 2013-03-04 NOTE — Assessment & Plan Note (Signed)
Blood pressure controlled. Continue present medications. Check potassium and renal function. 

## 2013-03-04 NOTE — Assessment & Plan Note (Signed)
Patient has discontinued. I encouraged her to continue off.

## 2013-03-04 NOTE — Assessment & Plan Note (Signed)
Continue statin. Check lipids and liver. 

## 2013-03-04 NOTE — Assessment & Plan Note (Signed)
Patient complains of a productive cough. We'll add a Z-Pak.

## 2013-03-04 NOTE — Assessment & Plan Note (Signed)
Continue aspirin and statin. Followed by vascular surgery. 

## 2013-03-04 NOTE — Patient Instructions (Signed)
Your physician wants you to follow-up in: ONE YEAR WITH DR CRENSHAW You will receive a reminder letter in the mail two months in advance. If you don't receive a letter, please call our office to schedule the follow-up appointment.   Your physician recommends that you HAVE LAB WORK TODAY 

## 2013-03-04 NOTE — Progress Notes (Addendum)
HPI: [name] is a very pleasant female that I have seen in the past for coronary artery disease. She has had prior coronary artery bypass grafting in 2006. Her LV function is normal. Echocardiogram in July of 2008 revealed normal LV function, mild aortic insufficiency and mild tricuspid regurgitation. Her most recent Myoview was performed in January 2014. Ejection fraction 81% with normal perfusion. Patient also with cerebrovascular disease followed by vascular surgery. I last saw her in Jan 2014. Since then the patient has dyspnea with more extreme activities but not with routine activities. It is relieved with rest. It is not associated with chest pain. There is no orthopnea, PND or pedal edema. There is no syncope or palpitations. There is no exertional chest pain.    Current Outpatient Prescriptions  Medication Sig Dispense Refill  . aspirin EC 81 MG tablet Take 81 mg by mouth daily.        . CRESTOR 40 MG tablet TAKE 1 TABLET AT BEDTIME  90 tablet  3  . hydrochlorothiazide (MICROZIDE) 12.5 MG capsule Take 1 capsule (12.5 mg total) by mouth daily.  90 capsule  3  . hydrocortisone-pramoxine (PROCTOFOAM-HC) rectal foam Place 1 applicator rectally at bedtime.  10 g  0  . mesalamine (LIALDA) 1.2 G EC tablet take 2 tablets by mouth once daily  120 tablet  0  . mesalamine (LIALDA) 1.2 G EC tablet take 4 tablets by mouth once daily  120 tablet  6  . metoprolol tartrate (LOPRESSOR) 25 MG tablet Take 1 tablet (25 mg total) by mouth 2 (two) times daily.  180 tablet  3  . valsartan (DIOVAN) 160 MG tablet TAKE 1 TABLET DAILY  90 tablet  2   No current facility-administered medications for this visit.     Past Medical History  Diagnosis Date  . CAD (coronary artery disease)     unspecified  . Cerebrovascular disease   . Hyperlipidemia, mixed   . HTN (hypertension)   . Ulcerative colitis   . Diverticulitis   . Colon polyps 2006  . Thyroid disease   . Myocardial infarct   . Carotid  artery occlusion   . Diabetes mellitus without complication     Borderline    Past Surgical History  Procedure Laterality Date  . Cholecystectomy  7.06.2008    with cholangiogram  . 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 Abdominal hysterectomy    . Carotid endarterectomy      History   Social History  . Marital Status: Married    Spouse Name: N/A    Number of Children: N/A  . Years of Education: N/A   Occupational History  . retired    Social History Main Topics  . Smoking status: Current Every Day Smoker -- 0.30 packs/day for 40 years    Types: Cigarettes  . Smokeless tobacco: Never Used  . Alcohol Use: No  . Drug Use: No  . Sexual Activity: Not on file   Other Topics Concern  . Not on file   Social History Narrative  . No narrative on file    ROS: patient complains of a productive cough but no fevers or chills, hemoptysis, dysphasia, odynophagia, melena, hematochezia, dysuria, hematuria, rash, seizure activity, orthopnea, PND, pedal edema, claudication. Remaining systems are negative.  Physical Exam: Well-developed well-nourished in no acute distress.  Skin is warm and dry.  HEENT is normal.  Neck is supple.  Chest is clear to auscultation with normal expansion.  Cardiovascular exam is regular rate and rhythm.  Abdominal exam nontender or distended. No masses palpated. Extremities show no edema. neuro grossly intact  ECG 02/25/2013-sinus rhythm, first degree AV block, no ST changes

## 2013-03-04 NOTE — Addendum Note (Signed)
Addended by: Cristopher Estimable on: 03/04/2013 10:50 AM   Modules accepted: Orders

## 2013-03-04 NOTE — Assessment & Plan Note (Signed)
Continue aspirin and statin. 

## 2013-03-07 ENCOUNTER — Other Ambulatory Visit: Payer: Self-pay | Admitting: *Deleted

## 2013-03-07 DIAGNOSIS — I6529 Occlusion and stenosis of unspecified carotid artery: Secondary | ICD-10-CM

## 2013-05-03 ENCOUNTER — Telehealth: Payer: Self-pay | Admitting: Cardiovascular Disease

## 2013-05-03 NOTE — Telephone Encounter (Signed)
ROI mailed to pt home Address

## 2013-05-04 ENCOUNTER — Other Ambulatory Visit: Payer: Self-pay

## 2013-05-04 MED ORDER — ROSUVASTATIN CALCIUM 40 MG PO TABS: ORAL_TABLET | ORAL | Status: DC

## 2013-05-17 ENCOUNTER — Encounter: Payer: Medicare Other | Attending: Internal Medicine | Admitting: Dietician

## 2013-05-17 ENCOUNTER — Encounter: Payer: Self-pay | Admitting: Dietician

## 2013-05-17 VITALS — Ht 69.0 in | Wt 224.4 lb

## 2013-05-17 DIAGNOSIS — E119 Type 2 diabetes mellitus without complications: Secondary | ICD-10-CM | POA: Insufficient documentation

## 2013-05-17 DIAGNOSIS — Z713 Dietary counseling and surveillance: Secondary | ICD-10-CM | POA: Insufficient documentation

## 2013-05-17 NOTE — Progress Notes (Signed)
  Medical Nutrition Therapy:  Appt start time: 1115 end time:  1215.   Assessment:  Primary concerns today: [name] is here today for diabetes education. Previous Hgb A1c was 6.5% and was 6.3% two weeks ago. Stopped smoking in January and cut back on sweets and starches at the same time.   Aliya lives with her husband and daughter and does the food preparation at home. Goes out to eat about 2 x week.   Preferred Learning Style:   No preference indicated   Learning Readiness:   Ready  MEDICATIONS: see list, no diabetes medication   DIETARY INTAKE:  Avoided foods include: corn, oatmeal other high fiber foods aggravate colon   24-hr recall:  B ( AM): cheerios cereal and banana with whole milk with water Snk ( AM): apple sometimes   L ( PM): skips most of the time Snk ( PM): none D ( PM): meat, starch, and vegetable sometimes with cranberry juice Snk ( PM): apple or dried cranberries  Beverages: water and a little soda  Usual physical activity: none  Estimated energy needs: 1600 calories 180 g carbohydrates 120 g protein 44 g fat  Progress Towards Goal(s):  In progress.   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  As evidenced by Hgb A1c of 6.3%.    Intervention:  Nutrition counseling provided. Plan: Look into joining the gym and riding the bike for exercise. Look for the 50 calorie cranberry juice or think about having some regular juice with club soda.  For breakfast, think about have an egg with saltines every other day. If you are hungry in between meals, have some protein with carbohydrates for a snack. Fill half of your plate with vegetables and limit starch to a quarter of your plate (choose one starch per meal).  Try to drink mostly water or try unsweet tea with splenda.  Portion out nuts and then put the container away.   Teaching Method Utilized:  Visual Auditory Hands on  Handouts given during visit  include:  Living Well With Diabetes  Yellow Card  MyPlate Handout  15 g CHO Snacks  Barriers to learning/adherence to lifestyle change: none  Demonstrated degree of understanding via:  Teach Back   Monitoring/Evaluation:  Dietary intake, exercise,  and body weight prn.

## 2013-05-17 NOTE — Patient Instructions (Signed)
Look into joining the gym and riding the bike for exercise. Look for the 50 calorie cranberry juice or think about having some regular juice with club soda.  For breakfast, think about have an egg with saltines every other day. If you are hungry in between meals, have some protein with carbohydrates for a snack. Fill half of your plate with vegetables and limit starch to a quarter of your plate (choose one starch per meal).  Try to drink mostly water or try unsweet tea with splenda.  Portion out nuts and then put the container away.

## 2013-08-12 ENCOUNTER — Other Ambulatory Visit: Payer: Self-pay | Admitting: Internal Medicine

## 2013-08-12 ENCOUNTER — Ambulatory Visit
Admission: RE | Admit: 2013-08-12 | Discharge: 2013-08-12 | Disposition: A | Discharge status: Home / Self Care | Payer: Medicare Other | Source: Ambulatory Visit | Attending: Internal Medicine | Admitting: Internal Medicine

## 2013-08-12 DIAGNOSIS — R059 Cough, unspecified: Secondary | ICD-10-CM

## 2013-08-12 DIAGNOSIS — R05 Cough: Secondary | ICD-10-CM

## 2013-08-17 ENCOUNTER — Telehealth: Payer: Self-pay | Admitting: Gastroenterology

## 2013-08-17 MED ORDER — HYDROCORTISONE ACE-PRAMOXINE 1-1 % RE FOAM: 1.0000 | Freq: Every day | RECTAL | Status: DC

## 2013-08-17 NOTE — Telephone Encounter (Signed)
She probably aggravated hemorrhoids with coughing and straining  Ok to Rx proctofoam x 1 if that is what she wants Proctoceram 2.5% bid prn # 1 tube ok also as are 25 mg HC suppositories bid prn # 24 no RF  If bleeding does not stop call back

## 2013-08-17 NOTE — Telephone Encounter (Signed)
[name] pt h/o UC currently taking Lialda. States her PCP gave her Ladona Ridgel for cough and it caused her to have rectal bleeding. Per pt PCP instructed her to call GI. Pt states she saw BRB on the tissue paper but did not notice if it colored the toilet bowl water or was in the stool. Pt states she has not been constipated. Has used Proctofoam in the past for hemorrhoids, does have history of hemorrhoids. Dr. Carlean Purl as doc of the day please advise.

## 2013-08-17 NOTE — Telephone Encounter (Signed)
Pt aware and script sent to pharmacy for Proctofoam.

## 2013-08-30 ENCOUNTER — Other Ambulatory Visit: Payer: Self-pay

## 2013-08-30 DIAGNOSIS — Z1231 Encounter for screening mammogram for malignant neoplasm of breast: Secondary | ICD-10-CM

## 2013-09-02 ENCOUNTER — Telehealth: Payer: Self-pay | Admitting: *Deleted

## 2013-09-02 MED ORDER — MESALAMINE 1.2 G PO TBEC: DELAYED_RELEASE_TABLET | ORAL | Status: DC

## 2013-09-02 NOTE — Telephone Encounter (Signed)
Med sent. Fax  request

## 2013-09-10 ENCOUNTER — Other Ambulatory Visit: Payer: Self-pay | Admitting: Cardiology

## 2013-09-16 ENCOUNTER — Encounter (INDEPENDENT_AMBULATORY_CARE_PROVIDER_SITE_OTHER): Payer: Self-pay

## 2013-09-16 ENCOUNTER — Ambulatory Visit
Admission: RE | Admit: 2013-09-16 | Discharge: 2013-09-16 | Disposition: A | Discharge status: Home / Self Care | Payer: Medicare Other | Source: Ambulatory Visit

## 2013-09-16 DIAGNOSIS — Z1231 Encounter for screening mammogram for malignant neoplasm of breast: Secondary | ICD-10-CM

## 2014-02-05 ENCOUNTER — Other Ambulatory Visit: Payer: Self-pay | Admitting: Cardiology

## 2014-03-01 ENCOUNTER — Encounter: Payer: Self-pay | Admitting: Family

## 2014-03-02 ENCOUNTER — Ambulatory Visit (INDEPENDENT_AMBULATORY_CARE_PROVIDER_SITE_OTHER): Payer: Commercial Managed Care - HMO | Admitting: Family

## 2014-03-02 ENCOUNTER — Ambulatory Visit (HOSPITAL_COMMUNITY)
Admission: RE | Admit: 2014-03-02 | Discharge: 2014-03-02 | Disposition: A | Discharge status: Home / Self Care | Payer: Commercial Managed Care - HMO | Source: Ambulatory Visit | Attending: Family | Admitting: Family

## 2014-03-02 ENCOUNTER — Encounter: Payer: Self-pay | Admitting: Family

## 2014-03-02 VITALS — BP 114/70 | HR 52 | Resp 14 | Ht 69.0 in | Wt 233.0 lb

## 2014-03-02 DIAGNOSIS — I6523 Occlusion and stenosis of bilateral carotid arteries: Secondary | ICD-10-CM | POA: Diagnosis not present

## 2014-03-02 DIAGNOSIS — E669 Obesity, unspecified: Secondary | ICD-10-CM

## 2014-03-02 DIAGNOSIS — Z87891 Personal history of nicotine dependence: Secondary | ICD-10-CM

## 2014-03-02 NOTE — Patient Instructions (Signed)
Stroke Prevention Some medical conditions and behaviors are associated with an increased chance of having a stroke. You may prevent a stroke by making healthy choices and managing medical conditions. HOW CAN I REDUCE MY RISK OF HAVING A STROKE?   Stay physically active. Get at least 30 minutes of activity on most or all days.  Do not smoke. It may also be helpful to avoid exposure to secondhand smoke.  Limit alcohol use. Moderate alcohol use is considered to be:  No more than 2 drinks per day for men.  No more than 1 drink per day for nonpregnant women.  Eat healthy foods. This involves:  Eating 5 or more servings of fruits and vegetables a day.  Making dietary changes that address high blood pressure (hypertension), high cholesterol, diabetes, or obesity.  Manage your cholesterol levels.  Making food choices that are high in fiber and low in saturated fat, trans fat, and cholesterol may control cholesterol levels.  Take any prescribed medicines to control cholesterol as directed by your health care provider.  Manage your diabetes.  Controlling your carbohydrate and sugar intake is recommended to manage diabetes.  Take any prescribed medicines to control diabetes as directed by your health care provider.  Control your hypertension.  Making food choices that are low in salt (sodium), saturated fat, trans fat, and cholesterol is recommended to manage hypertension.  Take any prescribed medicines to control hypertension as directed by your health care provider.  Maintain a healthy weight.  Reducing calorie intake and making food choices that are low in sodium, saturated fat, trans fat, and cholesterol are recommended to manage weight.  Stop drug abuse.  Avoid taking birth control pills.  Talk to your health care provider about the risks of taking birth control pills if you are over 35 years old, smoke, get migraines, or have ever had a blood clot.  Get evaluated for sleep  disorders (sleep apnea).  Talk to your health care provider about getting a sleep evaluation if you snore a lot or have excessive sleepiness.  Take medicines only as directed by your health care provider.  For some people, aspirin or blood thinners (anticoagulants) are helpful in reducing the risk of forming abnormal blood clots that can lead to stroke. If you have the irregular heart rhythm of atrial fibrillation, you should be on a blood thinner unless there is a good reason you cannot take them.  Understand all your medicine instructions.  Make sure that other conditions (such as anemia or atherosclerosis) are addressed. SEEK IMMEDIATE MEDICAL CARE IF:   You have sudden weakness or numbness of the face, arm, or leg, especially on one side of the body.  Your face or eyelid droops to one side.  You have sudden confusion.  You have trouble speaking (aphasia) or understanding.  You have sudden trouble seeing in one or both eyes.  You have sudden trouble walking.  You have dizziness.  You have a loss of balance or coordination.  You have a sudden, severe headache with no known cause.  You have new chest pain or an irregular heartbeat. Any of these symptoms may represent a serious problem that is an emergency. Do not wait to see if the symptoms will go away. Get medical help at once. Call your local emergency services (911 in [place] Do not drive yourself to the hospital. Document Released: 03/13/2004 Document Revised: 06/20/2013 Document Reviewed: 08/06/2012 [employer] Patient Information 2015 [employer]. This information is not intended to replace advice given   to you by your health care provider. Make sure you discuss any questions you have with your health care provider.  

## 2014-03-02 NOTE — Progress Notes (Signed)
Established Carotid Patient   History of Present Illness  [name] is a 74 y.o. female patient of Dr. Scot Dock followed for known carotid stenosis. She has not had any carotid artery intervention. She denies any history of stroke or TIA, denies non-healing wounds. Both calves hurt and her back hurts if she stands too long. She does not walk much due to painful calluses, states she sees a podiatrist on a regular basis.  She lives in a 3 story dwelling in which she has to climb stairs several times/day.  He had a 3 vessel CABG in 2006 which includes LIMA.  Patient denies New Medical or Surgical History.  Pt Diabetic: Yes, review of records: last A1C on file was 6.5, January 2015 Pt smoker: former smoker, quit January 2015, started smoking in her teens.  Pt meds include: Statin : Yes Betablocker: Yes ASA: yes, 81 mg, does not take daily as it irritates her colitis, states Dr. Delfina Redwood is aware Other anticoagulants/antiplatelets: no  Past Medical History  Diagnosis Date  . CAD (coronary artery disease)     unspecified  . Cerebrovascular disease   . Hyperlipidemia, mixed   . HTN (hypertension)   . Ulcerative colitis   . Diverticulitis   . Colon polyps 2006  . Thyroid disease   . Myocardial infarct   . Carotid artery occlusion   . Diabetes mellitus without complication     Borderline    Social History History  Substance Use Topics  . Smoking status: Former Smoker -- 0.30 packs/day for 40 years    Types: Cigarettes    Quit date: 02/17/2013  . Smokeless tobacco: Never Used  . Alcohol Use: No    Family History Family History  Problem Relation Age of Onset  . Colitis Brother     undefined  . Cancer Mother     stomach  . Stroke Father     died  . Colon cancer Neg Hx     Surgical History Past Surgical History  Procedure Laterality Date  . Cholecystectomy  7.06.2008    with cholangiogram  . 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 Abdominal hysterectomy     . Carotid endarterectomy      Allergies  Allergen Reactions  . Ivp Dye [Iodinated Diagnostic Agents] Other (See Comments)    "almost passed out"    Current Outpatient Prescriptions  Medication Sig Dispense Refill  . aspirin EC 81 MG tablet Take 81 mg by mouth daily.      Marland Kitchen azithromycin (ZITHROMAX Z-PAK) 250 MG tablet AS DIRECTED 6 each 0  . hydrochlorothiazide (MICROZIDE) 12.5 MG capsule take 1 capsule by mouth once daily 90 capsule 0  . hydrocortisone-pramoxine (PROCTOFOAM-HC) rectal foam Place 1 applicator rectally at bedtime. 10 g 1  . mesalamine (LIALDA) 1.2 G EC tablet take 2 tablets by mouth once daily 120 tablet 0  . mesalamine (LIALDA) 1.2 G EC tablet take 4 tablets by mouth once daily 120 tablet 6  . methimazole (TAPAZOLE) 5 MG tablet Take 5 mg by mouth 3 (three) times daily.    . metoprolol tartrate (LOPRESSOR) 25 MG tablet take 1 tablet by mouth twice a day 180 tablet 0  . rosuvastatin (CRESTOR) 40 MG tablet TAKE 1 TABLET AT BEDTIME 90 tablet 3  . valsartan (DIOVAN) 160 MG tablet TAKE 1 TABLET DAILY 90 tablet 1   No current facility-administered medications for this visit.    Review of Systems : See HPI for pertinent positives and negatives.  Physical Examination  Filed Vitals:   03/02/14 1441 03/02/14 1451  BP: 136/80 114/70  Pulse: 58 52  Resp: 14   Height: 5\' 9"  (1.753 m)   Weight: 233 lb (105.688 kg)    Body mass index is 34.39 kg/(m^2).  General: WDWN obese female in NAD. GAIT: normal Eyes: PERRLA Pulmonary: CTAB, Negative Rales, Negative rhonchi, & Negative wheezing.   Cardiac: Regular Rhythm, No detected Murmur.  VASCULAR EXAM Carotid Bruits Left Right   Negative Negative   Aorta is not palpable Radial pulses are 2+ palpable and equal.      LE Pulses LEFT RIGHT   POPLITEAL not palpable  not  palpable   POSTERIOR TIBIAL not palpable  not palpable    DORSALIS PEDIS  ANTERIOR TIBIAL 1+ palpable  1+ palpable     Gastrointestinal: soft, nontender, BS WNL, no r/g,no palpable masses.  Musculoskeletal: Negative muscle atrophy/wasting. M/S 5/5 in U/E's, 4/5 in LE's, Extremities without ischemic changes. Calluses on bony prominences of plantar surfaces of feet.  Neurologic: A&O X 3; Appropriate Affect ; SENSATION ;normal;  Speech is normal CN 2-12 intact, Pain and light touch intact in extremities, Motor exam as listed above.   Non-Invasive Vascular Imaging CAROTID DUPLEX 03/02/2014  CEREBROVASCULAR DUPLEX EVALUATION    INDICATION: Carotid artery stenosis    PREVIOUS INTERVENTION(S): NA    DUPLEX EXAM:     RIGHT  LEFT  Peak Systolic Velocities (cm/s) End Diastolic Velocities (cm/s) Plaque LOCATION Peak Systolic Velocities (cm/s) End Diastolic Velocities (cm/s) Plaque  83 10  CCA PROXIMAL 82 14   56 8 HT CCA MID 77 18 HT  45 9 CP CCA DISTAL 65 16 CP  75 0 CP ECA 56 8 CP  37/89 10/24 CP ICA PROXIMAL 108 28 CP  127 32  ICA MID 61 17   65 22  ICA DISTAL 56 12      ICA / CCA Ratio (PSV)   Antegrade Vertebral Flow Antegrade  964 Brachial Systolic Pressure (mmHg) 383  Triphasic Brachial Artery Waveforms Triphasic    Plaque Morphology:  HM = Homogeneous, HT = Heterogeneous, CP = Calcific Plaque, SP = Smooth Plaque, IP = Irregular Plaque  ADDITIONAL FINDINGS:     IMPRESSION: Right internal carotid artery stenosis present of less than 40%, however due to calcific plaque present stenosis may be underestimated. Left internal carotid artery stenosis present of less than 40%, however sue to calcific plaque present stenosis may be underestimated.    Compared to the previous exam:  Essentially unchanged since study on 02/24/2013.     Assessment: [name] is a 74 y.o. female who presents with asymptomatic minimal bilateral ICA stenosis,  however due  to calcific plaque present stenosis may be underestimated. Essentially unchanged since study on 02/24/2013. She was congratulated on continued smoking cessation. Face to face time with patient was 20 minutes. Over 50% of this time was spent on counseling and coordination of care.    Plan: Follow-up in 1 year with Carotid Duplex.   I discussed in depth with the patient the nature of atherosclerosis, and emphasized the importance of maximal medical management including strict control of blood pressure, blood glucose, and lipid levels, obtaining regular exercise, and continued cessation of smoking.  The patient is aware that without maximal medical management the underlying atherosclerotic disease process will progress, limiting the benefit of any interventions. The patient was given information about stroke prevention and what symptoms should prompt the patient to seek immediate medical care.  Thank you for allowing [place] to participate in this patient's care.  Clemon Chambers, RN, MSN, FNP-C [facility] Office: [phone]  Clinic Physician: Oneida Alar  03/02/2014   2:56 PM

## 2014-03-10 ENCOUNTER — Other Ambulatory Visit: Payer: Self-pay | Admitting: Cardiology

## 2014-03-12 NOTE — Telephone Encounter (Signed)
Rx(s) sent to pharmacy electronically. OV 03/28/14

## 2014-03-27 NOTE — Progress Notes (Signed)
      HPI: Fu coronary artery disease. She has had prior coronary artery bypass grafting in 2006. Her LV function is normal. Echocardiogram in July of 2008 revealed normal LV function, mild aortic insufficiency and mild tricuspid regurgitation. Her most recent Myoview was performed in January 2014. Ejection fraction 81% with normal perfusion. Patient also with cerebrovascular disease followed by vascular surgery. Since I last saw her, the patient denies any dyspnea on exertion, orthopnea, PND, pedal edema, palpitations, syncope or chest pain.   Current Outpatient Prescriptions  Medication Sig Dispense Refill  . aspirin EC 81 MG tablet Take 81 mg by mouth daily.      . hydrochlorothiazide (MICROZIDE) 12.5 MG capsule take 1 capsule by mouth once daily 90 capsule 0  . mesalamine (LIALDA) 1.2 G EC tablet take 4 tablets by mouth once daily 120 tablet 6  . methimazole (TAPAZOLE) 5 MG tablet Take 5 mg by mouth 3 (three) times daily.    . metoprolol tartrate (LOPRESSOR) 25 MG tablet take 1 tablet by mouth twice a day 180 tablet 0  . rosuvastatin (CRESTOR) 40 MG tablet TAKE 1 TABLET AT BEDTIME 90 tablet 3  . valsartan (DIOVAN) 160 MG tablet Take 1 tablet (160 mg total) by mouth daily. 90 tablet 0   No current facility-administered medications for this visit.     Past Medical History  Diagnosis Date  . CAD (coronary artery disease)     unspecified  . Cerebrovascular disease   . Hyperlipidemia, mixed   . HTN (hypertension)   . Ulcerative colitis   . Diverticulitis   . Colon polyps 2006  . Thyroid disease   . Myocardial infarct   . Carotid artery occlusion   . Diabetes mellitus without complication     Borderline    Past Surgical History  Procedure Laterality Date  . Cholecystectomy  7.06.2008    with cholangiogram  . 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 Abdominal hysterectomy    . Carotid endarterectomy      History   Social History  . Marital Status: Married    Spouse  Name: N/A    Number of Children: N/A  . Years of Education: N/A   Occupational History  . retired    Social History Main Topics  . Smoking status: Former Smoker -- 0.30 packs/day for 40 years    Types: Cigarettes    Quit date: 02/17/2013  . Smokeless tobacco: Never Used  . Alcohol Use: No  . Drug Use: No  . Sexual Activity: Not on file   Other Topics Concern  . Not on file   Social History Narrative    ROS: no fevers or chills, productive cough, hemoptysis, dysphasia, odynophagia, melena, hematochezia, dysuria, hematuria, rash, seizure activity, orthopnea, PND, pedal edema, claudication. Remaining systems are negative.  Physical Exam: Well-developed well-nourished in no acute distress.  Skin is warm and dry.  HEENT is normal.  Neck is supple.  Chest is clear to auscultation with normal expansion.  Cardiovascular exam is regular rate and rhythm.  Abdominal exam nontender or distended. No masses palpated. Extremities show no edema. neuro grossly intact  ECG sinus rhythm at a rate of 62. No ST changes.

## 2014-03-28 ENCOUNTER — Ambulatory Visit (INDEPENDENT_AMBULATORY_CARE_PROVIDER_SITE_OTHER): Payer: Commercial Managed Care - HMO | Admitting: Cardiology

## 2014-03-28 ENCOUNTER — Encounter: Payer: Self-pay | Admitting: Cardiology

## 2014-03-28 VITALS — BP 104/66 | HR 62 | Ht 69.5 in | Wt 234.1 lb

## 2014-03-28 DIAGNOSIS — I25810 Atherosclerosis of coronary artery bypass graft(s) without angina pectoris: Secondary | ICD-10-CM | POA: Diagnosis not present

## 2014-03-28 DIAGNOSIS — Z720 Tobacco use: Secondary | ICD-10-CM

## 2014-03-28 DIAGNOSIS — F17200 Nicotine dependence, unspecified, uncomplicated: Secondary | ICD-10-CM

## 2014-03-28 DIAGNOSIS — I679 Cerebrovascular disease, unspecified: Secondary | ICD-10-CM

## 2014-03-28 DIAGNOSIS — I2510 Atherosclerotic heart disease of native coronary artery without angina pectoris: Secondary | ICD-10-CM

## 2014-03-28 LAB — HEPATIC FUNCTION PANEL
ALK PHOS: 63 U/L (ref 39–117)
ALT: 10 U/L (ref 0–35)
AST: 17 U/L (ref 0–37)
Albumin: 4 g/dL (ref 3.5–5.2)
BILIRUBIN DIRECT: 0.1 mg/dL (ref 0.0–0.3)
BILIRUBIN INDIRECT: 0.5 mg/dL (ref 0.2–1.2)
BILIRUBIN TOTAL: 0.6 mg/dL (ref 0.2–1.2)
Total Protein: 6.7 g/dL (ref 6.0–8.3)

## 2014-03-28 LAB — BASIC METABOLIC PANEL WITH GFR
BUN: 22 mg/dL (ref 6–23)
CO2: 25 mEq/L (ref 19–32)
Calcium: 9.1 mg/dL (ref 8.4–10.5)
Chloride: 105 mEq/L (ref 96–112)
Creat: 1.08 mg/dL (ref 0.50–1.10)
GFR, EST AFRICAN AMERICAN: 58 mL/min — AB
GFR, Est Non African American: 50 mL/min — ABNORMAL LOW
GLUCOSE: 92 mg/dL (ref 70–99)
POTASSIUM: 4.2 meq/L (ref 3.5–5.3)
Sodium: 132 mEq/L — ABNORMAL LOW (ref 135–145)

## 2014-03-28 LAB — LIPID PANEL
Cholesterol: 146 mg/dL (ref 0–200)
HDL: 71 mg/dL (ref >39)
LDL Cholesterol: 66 mg/dL (ref 0–99)
Total CHOL/HDL Ratio: 2.1 Ratio
Triglycerides: 45 mg/dL (ref <150)
VLDL: 9 mg/dL (ref 0–40)

## 2014-03-28 NOTE — Assessment & Plan Note (Signed)
Continue statin. Check lipids and liver. 

## 2014-03-28 NOTE — Patient Instructions (Signed)
Your physician wants you to follow-up in: ONE YEAR WITH DR CRENSHAW You will receive a reminder letter in the mail two months in advance. If you don't receive a letter, please call our office to schedule the follow-up appointment.   Your physician recommends that you HAVE LAB WORK TODAY 

## 2014-03-28 NOTE — Assessment & Plan Note (Signed)
Continue aspirin and statin. 

## 2014-03-28 NOTE — Assessment & Plan Note (Signed)
Patient has discontinued. 

## 2014-03-28 NOTE — Assessment & Plan Note (Signed)
Blood pressure controlled. Continue present medications. Check potassium and renal function. 

## 2014-03-28 NOTE — Assessment & Plan Note (Signed)
Continue aspirin and statin. Followed by vascular surgery. 

## 2014-03-30 ENCOUNTER — Encounter: Payer: Self-pay | Admitting: *Deleted

## 2014-04-14 ENCOUNTER — Other Ambulatory Visit: Payer: Self-pay | Admitting: *Deleted

## 2014-04-14 MED ORDER — MESALAMINE 1.2 G PO TBEC: DELAYED_RELEASE_TABLET | ORAL | Status: DC

## 2014-05-17 ENCOUNTER — Other Ambulatory Visit: Payer: Self-pay | Admitting: Cardiology

## 2014-06-05 ENCOUNTER — Other Ambulatory Visit: Payer: Self-pay | Admitting: Cardiology

## 2014-06-15 ENCOUNTER — Other Ambulatory Visit: Payer: Self-pay | Admitting: *Deleted

## 2014-06-15 MED ORDER — MESALAMINE 1.2 G PO TBEC: DELAYED_RELEASE_TABLET | ORAL | Status: DC

## 2014-07-22 ENCOUNTER — Encounter (HOSPITAL_COMMUNITY): Payer: Self-pay

## 2014-07-22 ENCOUNTER — Emergency Department (HOSPITAL_COMMUNITY)
Admission: EM | Admit: 2014-07-22 | Discharge: 2014-07-22 | Disposition: A | Discharge status: Home / Self Care | Payer: Commercial Managed Care - HMO | Attending: Emergency Medicine | Admitting: Emergency Medicine

## 2014-07-22 ENCOUNTER — Emergency Department (HOSPITAL_COMMUNITY): Payer: Commercial Managed Care - HMO

## 2014-07-22 DIAGNOSIS — Z8673 Personal history of transient ischemic attack (TIA), and cerebral infarction without residual deficits: Secondary | ICD-10-CM | POA: Diagnosis not present

## 2014-07-22 DIAGNOSIS — Z7982 Long term (current) use of aspirin: Secondary | ICD-10-CM | POA: Diagnosis not present

## 2014-07-22 DIAGNOSIS — Y9389 Activity, other specified: Secondary | ICD-10-CM | POA: Diagnosis not present

## 2014-07-22 DIAGNOSIS — Z87891 Personal history of nicotine dependence: Secondary | ICD-10-CM | POA: Insufficient documentation

## 2014-07-22 DIAGNOSIS — I2510 Atherosclerotic heart disease of native coronary artery without angina pectoris: Secondary | ICD-10-CM | POA: Insufficient documentation

## 2014-07-22 DIAGNOSIS — S99921A Unspecified injury of right foot, initial encounter: Secondary | ICD-10-CM | POA: Diagnosis present

## 2014-07-22 DIAGNOSIS — Y9289 Other specified places as the place of occurrence of the external cause: Secondary | ICD-10-CM | POA: Diagnosis not present

## 2014-07-22 DIAGNOSIS — Z79899 Other long term (current) drug therapy: Secondary | ICD-10-CM | POA: Insufficient documentation

## 2014-07-22 DIAGNOSIS — E782 Mixed hyperlipidemia: Secondary | ICD-10-CM | POA: Diagnosis not present

## 2014-07-22 DIAGNOSIS — W07XXXA Fall from chair, initial encounter: Secondary | ICD-10-CM | POA: Insufficient documentation

## 2014-07-22 DIAGNOSIS — Y998 Other external cause status: Secondary | ICD-10-CM | POA: Diagnosis not present

## 2014-07-22 DIAGNOSIS — Z86010 Personal history of colonic polyps: Secondary | ICD-10-CM | POA: Insufficient documentation

## 2014-07-22 DIAGNOSIS — I252 Old myocardial infarction: Secondary | ICD-10-CM | POA: Insufficient documentation

## 2014-07-22 DIAGNOSIS — L03115 Cellulitis of right lower limb: Secondary | ICD-10-CM | POA: Diagnosis not present

## 2014-07-22 DIAGNOSIS — L03119 Cellulitis of unspecified part of limb: Secondary | ICD-10-CM

## 2014-07-22 DIAGNOSIS — E079 Disorder of thyroid, unspecified: Secondary | ICD-10-CM | POA: Insufficient documentation

## 2014-07-22 DIAGNOSIS — I10 Essential (primary) hypertension: Secondary | ICD-10-CM | POA: Insufficient documentation

## 2014-07-22 LAB — CBC WITH DIFFERENTIAL/PLATELET
Basophils Absolute: 0 10*3/uL (ref 0.0–0.1)
Basophils Relative: 1 % (ref 0–1)
Eosinophils Absolute: 0.1 10*3/uL (ref 0.0–0.7)
Eosinophils Relative: 2 % (ref 0–5)
HCT: 40.7 % (ref 36.0–46.0)
Hemoglobin: 13.5 g/dL (ref 12.0–15.0)
Lymphocytes Relative: 28 % (ref 12–46)
Lymphs Abs: 2.1 10*3/uL (ref 0.7–4.0)
MCH: 31.5 pg (ref 26.0–34.0)
MCHC: 33.2 g/dL (ref 30.0–36.0)
MCV: 95.1 fL (ref 78.0–100.0)
Monocytes Absolute: 0.5 10*3/uL (ref 0.1–1.0)
Monocytes Relative: 7 % (ref 3–12)
Neutro Abs: 4.8 10*3/uL (ref 1.7–7.7)
Neutrophils Relative %: 62 % (ref 43–77)
Platelets: 191 10*3/uL (ref 150–400)
RBC: 4.28 MIL/uL (ref 3.87–5.11)
RDW: 13.3 % (ref 11.5–15.5)
WBC: 7.5 10*3/uL (ref 4.0–10.5)

## 2014-07-22 LAB — I-STAT CHEM 8, ED
BUN: 19 mg/dL (ref 6–20)
CALCIUM ION: 1.16 mmol/L (ref 1.13–1.30)
CREATININE: 1 mg/dL (ref 0.44–1.00)
Chloride: 103 mmol/L (ref 101–111)
Glucose, Bld: 85 mg/dL (ref 65–99)
HEMATOCRIT: 44 % (ref 36.0–46.0)
Hemoglobin: 15 g/dL (ref 12.0–15.0)
Potassium: 3.5 mmol/L (ref 3.5–5.1)
SODIUM: 139 mmol/L (ref 135–145)
TCO2: 23 mmol/L (ref 0–100)

## 2014-07-22 LAB — COMPREHENSIVE METABOLIC PANEL WITH GFR
ALT: 44 U/L (ref 14–54)
AST: 56 U/L — ABNORMAL HIGH (ref 15–41)
Albumin: 3.3 g/dL — ABNORMAL LOW (ref 3.5–5.0)
Alkaline Phosphatase: 55 U/L (ref 38–126)
Anion gap: 6 (ref 5–15)
BUN: 18 mg/dL (ref 6–20)
CO2: 25 mmol/L (ref 22–32)
Calcium: 8.6 mg/dL — ABNORMAL LOW (ref 8.9–10.3)
Chloride: 102 mmol/L (ref 101–111)
Creatinine, Ser: 1.08 mg/dL — ABNORMAL HIGH (ref 0.44–1.00)
GFR calc Af Amer: 57 mL/min — ABNORMAL LOW
GFR calc non Af Amer: 49 mL/min — ABNORMAL LOW
Glucose, Bld: 89 mg/dL (ref 65–99)
Potassium: 6.4 mmol/L (ref 3.5–5.1)
Sodium: 133 mmol/L — ABNORMAL LOW (ref 135–145)
Total Bilirubin: 1.1 mg/dL (ref 0.3–1.2)
Total Protein: 6.1 g/dL — ABNORMAL LOW (ref 6.5–8.1)

## 2014-07-22 LAB — CBG MONITORING, ED: GLUCOSE-CAPILLARY: 89 mg/dL (ref 65–99)

## 2014-07-22 LAB — I-STAT CG4 LACTIC ACID, ED: Lactic Acid, Venous: 1 mmol/L (ref 0.5–2.0)

## 2014-07-22 MED ORDER — TRAMADOL HCL 50 MG PO TABS: 50.0000 mg | ORAL_TABLET | Freq: Four times a day (QID) | ORAL | Status: DC | PRN

## 2014-07-22 MED ORDER — CLINDAMYCIN PHOSPHATE 600 MG/50ML IV SOLN
600.0000 mg | Freq: Once | INTRAVENOUS | Status: DC
  Filled 2014-07-22: qty 50

## 2014-07-22 MED ORDER — CLINDAMYCIN HCL 300 MG PO CAPS
300.0000 mg | ORAL_CAPSULE | Freq: Once | ORAL | Status: AC
  Administered 2014-07-22: 300 mg via ORAL
  Filled 2014-07-22: qty 1

## 2014-07-22 MED ORDER — CLINDAMYCIN HCL 300 MG PO CAPS: 300.0000 mg | ORAL_CAPSULE | Freq: Four times a day (QID) | ORAL | Status: DC

## 2014-07-22 NOTE — ED Notes (Signed)
Taken to xray at this time. 

## 2014-07-22 NOTE — ED Provider Notes (Signed)
CSN: [id number]     Arrival date & time 07/22/14  1656 History   First MD Initiated Contact with Patient 07/22/14 1913     Chief Complaint  Patient presents with  . Foot Pain     (Consider location/radiation/quality/duration/timing/severity/associated sxs/prior Treatment) The history is provided by the patient.  [name] is a 74 y.o. female hx of CAD, MI, prediabetes here with right foot pain. Patient states that she fell a week ago and landed on the right foot. Denies syncope of the time. She has been walking on the leg and yesterday noticed more redness around the right fifth toe. Has been more painful as well. Denies any fevers or chills. She states that she is actually not diabetic just prediabetes and not on any medicines for currently,    Past Medical History  Diagnosis Date  . CAD (coronary artery disease)     unspecified  . Cerebrovascular disease   . Hyperlipidemia, mixed   . HTN (hypertension)   . Ulcerative colitis   . Diverticulitis   . Colon polyps 2006  . Thyroid disease   . Myocardial infarct   . Carotid artery occlusion   . Diabetes mellitus without complication     Borderline   Past Surgical History  Procedure Laterality Date  . Cholecystectomy  7.06.2008    with cholangiogram  . 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 Abdominal hysterectomy    . Carotid endarterectomy     Family History  Problem Relation Age of Onset  . Colitis Brother     undefined  . Cancer Mother     stomach  . Stroke Father     died  . Colon cancer Neg Hx    History  Substance Use Topics  . Smoking status: Former Smoker -- 0.30 packs/day for 40 years    Types: Cigarettes    Quit date: 02/17/2013  . Smokeless tobacco: Never Used  . Alcohol Use: No   OB History    No data available     Review of Systems  Musculoskeletal:       R 5th toe pain and redness  All other systems reviewed and are negative.     Allergies  Ivp dye  Home Medications   Prior to  Admission medications   Medication Sig Start Date End Date Taking? Authorizing Provider  aspirin EC 81 MG tablet Take 81 mg by mouth daily.     Yes Historical Provider, MD  CRESTOR 40 MG tablet TAKE 1 TABLET AT BEDTIME 05/17/14  Yes Lelon Perla, MD  hydrochlorothiazide (MICROZIDE) 12.5 MG capsule take 1 capsule by mouth once daily 05/18/14  Yes Lelon Perla, MD  mesalamine (LIALDA) 1.2 G EC tablet take 4 tablets by mouth once daily 06/15/14  Yes Inda Castle, MD  methimazole (TAPAZOLE) 5 MG tablet Take 5 mg by mouth 3 (three) times daily.   Yes Historical Provider, MD  metoprolol tartrate (LOPRESSOR) 25 MG tablet take 1 tablet by mouth twice a day 05/18/14  Yes Lelon Perla, MD  valsartan (DIOVAN) 160 MG tablet TAKE 1 TABLET DAILY 06/05/14  Yes Lelon Perla, MD  clindamycin (CLEOCIN) 300 MG capsule Take 1 capsule (300 mg total) by mouth 4 (four) times daily. X 7 days 07/22/14   Wandra Arthurs, MD  traMADol (ULTRAM) 50 MG tablet Take 1 tablet (50 mg total) by mouth every 6 (six) hours as needed. 07/22/14   Wandra Arthurs, MD   BP  142/66 mmHg  Pulse 59  Temp(Src) 98.8 F (37.1 C) (Oral)  Resp 18  Ht 5\' 9"  (1.753 m)  Wt 220 lb (99.791 kg)  BMI 32.47 kg/m2  SpO2 94% Physical Exam  Constitutional: She is oriented to person, place, and time. She appears well-developed and well-nourished.  HENT:  Head: Normocephalic.  Mouth/Throat: Oropharynx is clear and moist.  Eyes: Conjunctivae are normal. Pupils are equal, round, and reactive to light.  Neck: Normal range of motion. Neck supple.  Cardiovascular: Normal rate, regular rhythm and normal heart sounds.   Pulmonary/Chest: Effort normal and breath sounds normal. No respiratory distress. She has no wheezes. She has no rales.  Abdominal: Soft. Bowel sounds are normal. She exhibits no distension. There is no tenderness. There is no rebound.  Musculoskeletal:  R 5th toe with mild redness and tenderness. There is mild streaking up to mid foot.  No crepitance.   Neurological: She is alert and oriented to person, place, and time.  Skin: Skin is warm and dry.  Psychiatric: She has a normal mood and affect. Her behavior is normal. Judgment and thought content normal.  Nursing note and vitals reviewed.   ED Course  Procedures (including critical care time) Labs Review Labs Reviewed  COMPREHENSIVE METABOLIC PANEL - Abnormal; Notable for the following:    Sodium 133 (*)    Potassium 6.4 (*)    Creatinine, Ser 1.08 (*)    Calcium 8.6 (*)    Total Protein 6.1 (*)    Albumin 3.3 (*)    AST 56 (*)    GFR calc non Af Amer 49 (*)    GFR calc Af Amer 57 (*)    All other components within normal limits  CBC WITH DIFFERENTIAL/PLATELET  I-STAT CG4 LACTIC ACID, ED  CBG MONITORING, ED  I-STAT CHEM 8, ED    Imaging Review Dg Foot Complete Right  07/22/2014   CLINICAL DATA:  Tripped and fell when beginning or from a chair on 07/15/2014, RIGHT foot striking chair leg, increased pain at lateral RIGHT foot at little toe with redness today  EXAM: None  COMPARISON:  None  FINDINGS: Osseous demineralization.  Slight widening of PIP joints of the fourth and fifth toes, suspect old.  Remaining joint space is normal.  No acute fracture, dislocation, or bone destruction.  Small plantar calcaneal spur.  IMPRESSION: No acute osseous abnormalities.   Electronically Signed   By: Lavonia Dana M.D.   On: 07/22/2014 21:01     EKG Interpretation None      MDM   Final diagnoses:  Cellulitis of foot   [name] is a 74 y.o. female here with R foot cellulitis and pain. Consider fracture vs cellulitis, I doubt osteo. Will get labs, xrays.   10:55 PM CBG normal. K hemolyzed initially, repeat was normal. WBC nl. Xray showed no fracture. Dc home on clinda.    Wandra Arthurs, MD 07/22/14 2258

## 2014-07-22 NOTE — ED Notes (Signed)
Cleocin IV held at this time per md request.  States I am going to change that to po.

## 2014-07-22 NOTE — ED Notes (Signed)
Pt reports 07-15-14 pt got up from chair, tripped and landed on left side, right foot hit chair leg.  Pt has been doing [place] with foot this week until woke up this morning with pain to right lateral foot at little toe.  Redness is noted.  Pt did have cut in between little toe and next toe.  Nurse does not see cut at this time.

## 2014-07-22 NOTE — Discharge Instructions (Signed)
Take tylenol for pain.   Take tramadol for severe pain.   Use a cane to walk.  Take clindamycin as prescribed for a week.   See your foot doctor.   Return to ER if you have severe pain, worse swelling or redness or pain.

## 2014-07-31 ENCOUNTER — Telehealth: Payer: Self-pay | Admitting: Gastroenterology

## 2014-07-31 MED ORDER — MESALAMINE 1.2 G PO TBEC
DELAYED_RELEASE_TABLET | ORAL | Status: DC
Start: 2014-07-31 — End: 2014-10-17

## 2014-07-31 NOTE — Telephone Encounter (Signed)
Spoke with patient informed her to keep appointment and sent lialda in

## 2014-08-01 ENCOUNTER — Telehealth: Payer: Self-pay | Admitting: Gastroenterology

## 2014-08-01 NOTE — Telephone Encounter (Signed)
Patient reports blood with each BM. Only a little yesterday, but more today. Out of Lialda since Friday. She will be able to restart it today, soon. She doesn't feel well. Has a regular follow up 10/09/14 with Dr Deatra Ina. No profuse bleeding. Lightheaded at times. No syncope or near syncope. No vomiting. She is not sure if she is having a colitis flare, but would like to be seen. Has transportation on thursay. Appointment scheduled.

## 2014-08-03 ENCOUNTER — Other Ambulatory Visit (INDEPENDENT_AMBULATORY_CARE_PROVIDER_SITE_OTHER): Payer: Commercial Managed Care - HMO

## 2014-08-03 ENCOUNTER — Ambulatory Visit (INDEPENDENT_AMBULATORY_CARE_PROVIDER_SITE_OTHER): Payer: Commercial Managed Care - HMO | Admitting: Physician Assistant

## 2014-08-03 ENCOUNTER — Encounter: Payer: Self-pay | Admitting: Physician Assistant

## 2014-08-03 VITALS — BP 116/70 | HR 68 | Ht 68.25 in | Wt 227.2 lb

## 2014-08-03 DIAGNOSIS — K51511 Left sided colitis with rectal bleeding: Secondary | ICD-10-CM | POA: Diagnosis not present

## 2014-08-03 DIAGNOSIS — R197 Diarrhea, unspecified: Secondary | ICD-10-CM

## 2014-08-03 LAB — CBC WITH DIFFERENTIAL/PLATELET
BASOS PCT: 0.6 % (ref 0.0–3.0)
Basophils Absolute: 0 10*3/uL (ref 0.0–0.1)
EOS PCT: 0.8 % (ref 0.0–5.0)
Eosinophils Absolute: 0.1 10*3/uL (ref 0.0–0.7)
HCT: 44.3 % (ref 36.0–46.0)
HEMOGLOBIN: 14.5 g/dL (ref 12.0–15.0)
LYMPHS PCT: 19.1 % (ref 12.0–46.0)
Lymphs Abs: 1.5 10*3/uL (ref 0.7–4.0)
MCHC: 32.8 g/dL (ref 30.0–36.0)
MCV: 94.1 fl (ref 78.0–100.0)
MONOS PCT: 7.6 % (ref 3.0–12.0)
Monocytes Absolute: 0.6 10*3/uL (ref 0.1–1.0)
NEUTROS PCT: 71.9 % (ref 43.0–77.0)
Neutro Abs: 5.8 10*3/uL (ref 1.4–7.7)
Platelets: 185 10*3/uL (ref 150.0–400.0)
RBC: 4.71 Mil/uL (ref 3.87–5.11)
RDW: 13.5 % (ref 11.5–15.5)
WBC: 8.1 10*3/uL (ref 4.0–10.5)

## 2014-08-03 LAB — BASIC METABOLIC PANEL
BUN: 15 mg/dL (ref 6–23)
CO2: 25 mEq/L (ref 19–32)
Calcium: 9.6 mg/dL (ref 8.4–10.5)
Chloride: 99 mEq/L (ref 96–112)
Creatinine, Ser: 1.3 mg/dL — ABNORMAL HIGH (ref 0.40–1.20)
GFR: 51.22 mL/min — ABNORMAL LOW (ref >60.00)
Glucose, Bld: 93 mg/dL (ref 70–99)
POTASSIUM: 3.1 meq/L — AB (ref 3.5–5.1)
SODIUM: 134 meq/L — AB (ref 135–145)

## 2014-08-03 MED ORDER — SACCHAROMYCES BOULARDII 250 MG PO CAPS: 250.0000 mg | ORAL_CAPSULE | Freq: Two times a day (BID) | ORAL | Status: DC

## 2014-08-03 NOTE — Progress Notes (Signed)
Patient ID: [name], female   DOB: 08/21/1940, 74 y.o.   MRN: [id number]   Subjective:    Patient ID: [name], female    DOB: 07-23-1940, 74 y.o.   MRN: [id number]  HPI [name] is a pleasant 74 year old African-American female known to Dr. Deatra Ina who has history of left-sided ulcerative colitis, diagnosed in 2006. She has been maintained on Lialda 4.8 g daily. Her last colonoscopy was September 2014 and she was noted to have scattered left-sided diverticulosis and a focal area of colitis in the left colon. Biopsy just showed minimal cryptitis. She comes in today stating that she has been having horrible diarrhea over the past week. Her stools have been loose to liquid and mixed with bright red blood and mucus. She says she is having bowel movements every 1-1/2-2 hours. She has not had any significant abdominal pain or cramping, appetite has been decreased though no nausea or vomiting. No fever or chills.  She had been on a course of Cleocin for a cellulitis of her fall at area distress was started after an ER visit on 07/22/2014. After a few days of severe diarrhea she stopped the Cleocin. She has been back to see her "foot doctor" and was given a prescription of Ferol Luz quit she says she's not taking regularly either.  Review of Systems Pertinent positive and negative review of systems were noted in the above HPI section.  All other review of systems was otherwise negative.  Outpatient Encounter Prescriptions as of 08/03/2014  Medication Sig  . aspirin EC 81 MG tablet Take 81 mg by mouth as needed.   . clindamycin (CLEOCIN) 300 MG capsule Take 1 capsule (300 mg total) by mouth 4 (four) times daily. X 7 days  . CRESTOR 40 MG tablet TAKE 1 TABLET AT BEDTIME  . hydrochlorothiazide (MICROZIDE) 12.5 MG capsule take 1 capsule by mouth once daily  . meloxicam (MOBIC) 15 MG tablet Take 1 tablet by mouth daily.  . mesalamine (LIALDA) 1.2 G EC tablet take 4 tablets by mouth once daily  . metoprolol  tartrate (LOPRESSOR) 25 MG tablet take 1 tablet by mouth twice a day  . valsartan (DIOVAN) 160 MG tablet TAKE 1 TABLET DAILY  . saccharomyces boulardii (FLORASTOR) 250 MG capsule Take 1 capsule (250 mg total) by mouth 2 (two) times daily.  . [DISCONTINUED] methimazole (TAPAZOLE) 5 MG tablet Take 5 mg by mouth 3 (three) times daily.  . [DISCONTINUED] traMADol (ULTRAM) 50 MG tablet Take 1 tablet (50 mg total) by mouth every 6 (six) hours as needed.   No facility-administered encounter medications on file as of 08/03/2014.   Allergies  Allergen Reactions  . Ivp Dye [Iodinated Diagnostic Agents] Other (See Comments)    "almost passed out"   Patient Active Problem List   Diagnosis Date Noted  . Bronchitis 03/04/2013  . Thyroid enlarged-swollen 02/24/2013  . Occlusion and stenosis of carotid artery without mention of cerebral infarction 02/26/2012  . TOBACCO ABUSE 07/12/2008  . Cough 07/12/2008  . HYPERLIPIDEMIA-MIXED 07/11/2008  . Essential hypertension 07/11/2008  . Coronary atherosclerosis 07/11/2008  . Cerebrovascular disease 07/11/2008  . ULCERATIVE COLITIS, HX OF 07/11/2008   History   Social History  . Marital Status: Married    Spouse Name: N/A  . Number of Children: N/A  . Years of Education: N/A   Occupational History  . retired    Social History Main Topics  . Smoking status: Former Smoker -- 0.30 packs/day for 40 years  Types: Cigarettes    Quit date: 02/17/2013  . Smokeless tobacco: Never Used  . Alcohol Use: No  . Drug Use: No  . Sexual Activity: Not on file   Other Topics Concern  . Not on file   Social History Narrative    [name]'s family history includes Colitis in her brother; Stomach cancer in her mother; Stroke in her father. There is no history of Colon cancer.      Objective:    Filed Vitals:   08/03/14 1409  BP: 116/70  Pulse: 68    Physical Exam  well-developed older African-American female in no acute distress, pleasant blood  pressure 116/70 pulse 68 height 5 foot 8 weight 227. HEENT ;nontraumatic normocephalic EOMI PERRLA sclera anicteric Supple; no JVD, Cardiovascular; regular rate and rhythm with S1-S2 no murmur or gallop, Pulmonary; clear bilaterally, Abdomen ;soft she has some tenderness in the right mid and right lower quadrant and also left lower quadrant no guarding or rebound no palpable mass or hepatosplenomegaly bowel sounds are present, Rectal; exam not done, Extremities; no clubbing cyanosis or edema skin warm and dry, Psych ;mood and affect appropriate       Assessment & Plan:   #1 74 yo female with known left-sided ulcerative colitis which has been under good control and over the past few years now presenting with an acute illness with multiple diarrheal stools per day mixed with blood and mucus. Symptom onset after starting Cleocin for a cellulitis. Rule out antibiotic associated exacerbation of her colitis, rule out C. difficile colitis #2 coronary artery disease #3 cerebrovascular disease #4 hypertension  Plan; bland low-residue diet, push fluids Stay off Cleocin Stop Mobic Will obtain CBC with differential, BMET, stool for C. difficile by PCR Start Florastor one by mouth twice a day Started trial of Uceris 9 mg by mouth every morning would plan for 6-8 week course Continue Lialda 4.8 g by mouth daily Hopefully we'll have a result on the stool for C. difficile and if positive will initiate metronidazole Follow-up office visit in 2 weeks with Amy or Dr. Winfield Cunas PA-C 08/03/2014   Cc: Seward Carol, MD

## 2014-08-03 NOTE — Patient Instructions (Signed)
Please go to the basement level to have your labs drawn and stool study,. Imodium 3-4 times daily as needed.  Stay on the Lialda, 4 tablets daily. Florastor twice daily.   We have given you Uceris 9 mg daily for 7 weeks.

## 2014-08-04 ENCOUNTER — Telehealth: Payer: Self-pay | Admitting: Gastroenterology

## 2014-08-04 ENCOUNTER — Other Ambulatory Visit: Payer: Self-pay

## 2014-08-04 LAB — CLOSTRIDIUM DIFFICILE BY PCR: Toxigenic C. Difficile by PCR: NOT DETECTED

## 2014-08-04 MED ORDER — POTASSIUM CHLORIDE ER 20 MEQ PO TBCR: EXTENDED_RELEASE_TABLET | ORAL | Status: DC

## 2014-08-04 NOTE — Telephone Encounter (Signed)
Discussed her lab results and the plan. She has a slightly low potassium which she will take K 20 mEq BID for 5 days. CBC wnl's. C-diff is negative. She did start on her medications yesterday. She will go with a bland diet, drink fluids and hold Hydrochlorothiazide and Valsartan over the weekend. Take Imodium as directed. Call with her progress early next week. Per Nicoletta Ba may consider steroids if not improving.

## 2014-08-06 ENCOUNTER — Telehealth: Payer: Self-pay | Admitting: Gastroenterology

## 2014-08-06 MED ORDER — PREDNISONE 10 MG PO TABS: 20.0000 mg | ORAL_TABLET | Freq: Two times a day (BID) | ORAL | Status: DC

## 2014-08-06 NOTE — Progress Notes (Signed)
If stool study is negative would schedule f. sig

## 2014-08-06 NOTE — Telephone Encounter (Signed)
She called this afternoon. Feeling weaker, not eating or drinking well. Still with bloody diarrhea. She sounded dehydrated and I recommended she go to the ER for workup, fluids.  She declined and so we decided that she would try prednisone (20mg  twice daily). Will call this in to her pharmacy but she knows that if she feels worse she will need to call back or go to ER.

## 2014-08-07 ENCOUNTER — Telehealth: Payer: Self-pay | Admitting: Gastroenterology

## 2014-08-07 ENCOUNTER — Encounter (HOSPITAL_COMMUNITY): Payer: Self-pay | Admitting: Family Medicine

## 2014-08-07 ENCOUNTER — Inpatient Hospital Stay (HOSPITAL_COMMUNITY)
Admission: EM | Admit: 2014-08-07 | Discharge: 2014-08-21 | DRG: 386 | Disposition: A | Discharge status: Home / Self Care | Payer: Commercial Managed Care - HMO | Attending: Internal Medicine | Admitting: Internal Medicine

## 2014-08-07 DIAGNOSIS — I252 Old myocardial infarction: Secondary | ICD-10-CM

## 2014-08-07 DIAGNOSIS — K51918 Ulcerative colitis, unspecified with other complication: Secondary | ICD-10-CM | POA: Diagnosis not present

## 2014-08-07 DIAGNOSIS — N179 Acute kidney failure, unspecified: Secondary | ICD-10-CM | POA: Diagnosis present

## 2014-08-07 DIAGNOSIS — Z7952 Long term (current) use of systemic steroids: Secondary | ICD-10-CM

## 2014-08-07 DIAGNOSIS — E871 Hypo-osmolality and hyponatremia: Secondary | ICD-10-CM | POA: Diagnosis present

## 2014-08-07 DIAGNOSIS — T3695XA Adverse effect of unspecified systemic antibiotic, initial encounter: Secondary | ICD-10-CM | POA: Diagnosis present

## 2014-08-07 DIAGNOSIS — T380X5A Adverse effect of glucocorticoids and synthetic analogues, initial encounter: Secondary | ICD-10-CM | POA: Diagnosis present

## 2014-08-07 DIAGNOSIS — R1084 Generalized abdominal pain: Secondary | ICD-10-CM

## 2014-08-07 DIAGNOSIS — E860 Dehydration: Secondary | ICD-10-CM | POA: Diagnosis present

## 2014-08-07 DIAGNOSIS — Z951 Presence of aortocoronary bypass graft: Secondary | ICD-10-CM | POA: Diagnosis not present

## 2014-08-07 DIAGNOSIS — Z6833 Body mass index (BMI) 33.0-33.9, adult: Secondary | ICD-10-CM | POA: Diagnosis not present

## 2014-08-07 DIAGNOSIS — K51911 Ulcerative colitis, unspecified with rectal bleeding: Principal | ICD-10-CM | POA: Diagnosis present

## 2014-08-07 DIAGNOSIS — D72829 Elevated white blood cell count, unspecified: Secondary | ICD-10-CM | POA: Diagnosis not present

## 2014-08-07 DIAGNOSIS — K51919 Ulcerative colitis, unspecified with unspecified complications: Secondary | ICD-10-CM

## 2014-08-07 DIAGNOSIS — E669 Obesity, unspecified: Secondary | ICD-10-CM | POA: Diagnosis present

## 2014-08-07 DIAGNOSIS — I10 Essential (primary) hypertension: Secondary | ICD-10-CM | POA: Diagnosis not present

## 2014-08-07 DIAGNOSIS — K5190 Ulcerative colitis, unspecified, without complications: Secondary | ICD-10-CM | POA: Diagnosis not present

## 2014-08-07 DIAGNOSIS — Z87891 Personal history of nicotine dependence: Secondary | ICD-10-CM | POA: Diagnosis not present

## 2014-08-07 DIAGNOSIS — Z791 Long term (current) use of non-steroidal anti-inflammatories (NSAID): Secondary | ICD-10-CM

## 2014-08-07 DIAGNOSIS — E1165 Type 2 diabetes mellitus with hyperglycemia: Secondary | ICD-10-CM | POA: Diagnosis present

## 2014-08-07 DIAGNOSIS — E876 Hypokalemia: Secondary | ICD-10-CM | POA: Diagnosis present

## 2014-08-07 DIAGNOSIS — I82402 Acute embolism and thrombosis of unspecified deep veins of left lower extremity: Secondary | ICD-10-CM | POA: Diagnosis not present

## 2014-08-07 DIAGNOSIS — R1031 Right lower quadrant pain: Secondary | ICD-10-CM | POA: Diagnosis present

## 2014-08-07 DIAGNOSIS — R001 Bradycardia, unspecified: Secondary | ICD-10-CM | POA: Diagnosis present

## 2014-08-07 DIAGNOSIS — Z7982 Long term (current) use of aspirin: Secondary | ICD-10-CM | POA: Diagnosis not present

## 2014-08-07 DIAGNOSIS — R197 Diarrhea, unspecified: Secondary | ICD-10-CM | POA: Diagnosis not present

## 2014-08-07 DIAGNOSIS — I2510 Atherosclerotic heart disease of native coronary artery without angina pectoris: Secondary | ICD-10-CM | POA: Diagnosis not present

## 2014-08-07 DIAGNOSIS — K529 Noninfective gastroenteritis and colitis, unspecified: Secondary | ICD-10-CM | POA: Diagnosis not present

## 2014-08-07 DIAGNOSIS — K611 Rectal abscess: Secondary | ICD-10-CM | POA: Diagnosis not present

## 2014-08-07 DIAGNOSIS — E782 Mixed hyperlipidemia: Secondary | ICD-10-CM | POA: Diagnosis present

## 2014-08-07 DIAGNOSIS — Z8719 Personal history of other diseases of the digestive system: Secondary | ICD-10-CM

## 2014-08-07 DIAGNOSIS — A047 Enterocolitis due to Clostridium difficile: Secondary | ICD-10-CM | POA: Diagnosis not present

## 2014-08-07 DIAGNOSIS — A419 Sepsis, unspecified organism: Secondary | ICD-10-CM | POA: Diagnosis not present

## 2014-08-07 DIAGNOSIS — D62 Acute posthemorrhagic anemia: Secondary | ICD-10-CM | POA: Diagnosis not present

## 2014-08-07 DIAGNOSIS — K521 Toxic gastroenteritis and colitis: Secondary | ICD-10-CM | POA: Diagnosis present

## 2014-08-07 DIAGNOSIS — K51914 Ulcerative colitis, unspecified with abscess: Secondary | ICD-10-CM | POA: Diagnosis not present

## 2014-08-07 HISTORY — DX: Unspecified osteoarthritis, unspecified site: M19.90

## 2014-08-07 HISTORY — DX: Gastro-esophageal reflux disease without esophagitis: K21.9

## 2014-08-07 LAB — URINALYSIS, ROUTINE W REFLEX MICROSCOPIC
Bilirubin Urine: NEGATIVE
GLUCOSE, UA: NEGATIVE mg/dL
HGB URINE DIPSTICK: NEGATIVE
Ketones, ur: NEGATIVE mg/dL
Leukocytes, UA: NEGATIVE
Nitrite: NEGATIVE
PH: 6 (ref 5.0–8.0)
Protein, ur: 100 mg/dL — AB
Specific Gravity, Urine: 1.019 (ref 1.005–1.030)
Urobilinogen, UA: 0.2 mg/dL (ref 0.0–1.0)

## 2014-08-07 LAB — URINE MICROSCOPIC-ADD ON

## 2014-08-07 LAB — COMPREHENSIVE METABOLIC PANEL
ALBUMIN: 2.8 g/dL — AB (ref 3.5–5.0)
ALT: 12 U/L — AB (ref 14–54)
AST: 18 U/L (ref 15–41)
Alkaline Phosphatase: 71 U/L (ref 38–126)
Anion gap: 12 (ref 5–15)
BUN: 15 mg/dL (ref 6–20)
CALCIUM: 8.7 mg/dL — AB (ref 8.9–10.3)
CHLORIDE: 99 mmol/L — AB (ref 101–111)
CO2: 22 mmol/L (ref 22–32)
Creatinine, Ser: 1.07 mg/dL — ABNORMAL HIGH (ref 0.44–1.00)
GFR calc Af Amer: 57 mL/min — ABNORMAL LOW (ref >60)
GFR calc non Af Amer: 49 mL/min — ABNORMAL LOW (ref >60)
Glucose, Bld: 153 mg/dL — ABNORMAL HIGH (ref 65–99)
Potassium: 3.2 mmol/L — ABNORMAL LOW (ref 3.5–5.1)
SODIUM: 133 mmol/L — AB (ref 135–145)
TOTAL PROTEIN: 6.9 g/dL (ref 6.5–8.1)
Total Bilirubin: 0.6 mg/dL (ref 0.3–1.2)

## 2014-08-07 LAB — CBC WITH DIFFERENTIAL/PLATELET
BASOS ABS: 0 10*3/uL (ref 0.0–0.1)
Basophils Relative: 0 % (ref 0–1)
Eosinophils Absolute: 0 10*3/uL (ref 0.0–0.7)
Eosinophils Relative: 0 % (ref 0–5)
HCT: 40.9 % (ref 36.0–46.0)
HEMOGLOBIN: 14 g/dL (ref 12.0–15.0)
Lymphocytes Relative: 7 % — ABNORMAL LOW (ref 12–46)
Lymphs Abs: 1 10*3/uL (ref 0.7–4.0)
MCH: 31.2 pg (ref 26.0–34.0)
MCHC: 34.2 g/dL (ref 30.0–36.0)
MCV: 91.1 fL (ref 78.0–100.0)
MONO ABS: 0.9 10*3/uL (ref 0.1–1.0)
MONOS PCT: 6 % (ref 3–12)
NEUTROS PCT: 87 % — AB (ref 43–77)
Neutro Abs: 11.6 10*3/uL — ABNORMAL HIGH (ref 1.7–7.7)
Platelets: 231 10*3/uL (ref 150–400)
RBC: 4.49 MIL/uL (ref 3.87–5.11)
RDW: 12.9 % (ref 11.5–15.5)
WBC: 13.5 10*3/uL — ABNORMAL HIGH (ref 4.0–10.5)

## 2014-08-07 LAB — LIPASE, BLOOD: Lipase: 13 U/L — ABNORMAL LOW (ref 22–51)

## 2014-08-07 MED ORDER — SODIUM CHLORIDE 0.9 % IV SOLN
INTRAVENOUS | Status: DC
  Administered 2014-08-07 – 2014-08-08 (×2): via INTRAVENOUS

## 2014-08-07 MED ORDER — ONDANSETRON HCL 4 MG PO TABS: 4.0000 mg | ORAL_TABLET | Freq: Four times a day (QID) | ORAL | Status: DC | PRN

## 2014-08-07 MED ORDER — POTASSIUM CHLORIDE CRYS ER 20 MEQ PO TBCR
40.0000 meq | EXTENDED_RELEASE_TABLET | Freq: Once | ORAL | Status: AC
  Administered 2014-08-07: 40 meq via ORAL
  Filled 2014-08-07: qty 2

## 2014-08-07 MED ORDER — SODIUM CHLORIDE 0.9 % IV BOLUS (SEPSIS)
1000.0000 mL | Freq: Once | INTRAVENOUS | Status: AC
  Administered 2014-08-07: 1000 mL via INTRAVENOUS

## 2014-08-07 MED ORDER — ACETAMINOPHEN 650 MG RE SUPP: 650.0000 mg | Freq: Four times a day (QID) | RECTAL | Status: DC | PRN

## 2014-08-07 MED ORDER — METOPROLOL TARTRATE 25 MG PO TABS
25.0000 mg | ORAL_TABLET | Freq: Two times a day (BID) | ORAL | Status: DC
  Administered 2014-08-07 – 2014-08-12 (×10): 25 mg via ORAL
  Filled 2014-08-07 (×12): qty 1

## 2014-08-07 MED ORDER — ROSUVASTATIN CALCIUM 40 MG PO TABS
40.0000 mg | ORAL_TABLET | Freq: Every day | ORAL | Status: DC
  Administered 2014-08-07 – 2014-08-20 (×14): 40 mg via ORAL
  Filled 2014-08-07 (×12): qty 1
  Filled 2014-08-07: qty 2
  Filled 2014-08-07 (×4): qty 1

## 2014-08-07 MED ORDER — SODIUM CHLORIDE 0.45 % IV SOLN
INTRAVENOUS | Status: DC
  Administered 2014-08-07: 17:00:00 via INTRAVENOUS
  Filled 2014-08-07 (×4): qty 1000

## 2014-08-07 MED ORDER — ACETAMINOPHEN 325 MG PO TABS: 650.0000 mg | ORAL_TABLET | Freq: Four times a day (QID) | ORAL | Status: DC | PRN

## 2014-08-07 MED ORDER — ONDANSETRON HCL 4 MG/2ML IJ SOLN
4.0000 mg | Freq: Four times a day (QID) | INTRAMUSCULAR | Status: DC | PRN
  Administered 2014-08-08: 4 mg via INTRAVENOUS
  Filled 2014-08-07: qty 2

## 2014-08-07 MED ORDER — MESALAMINE 1.2 G PO TBEC
4.8000 g | DELAYED_RELEASE_TABLET | Freq: Every day | ORAL | Status: DC
  Administered 2014-08-08 – 2014-08-21 (×14): 4.8 g via ORAL
  Filled 2014-08-07 (×15): qty 4

## 2014-08-07 MED ORDER — SACCHAROMYCES BOULARDII 250 MG PO CAPS
250.0000 mg | ORAL_CAPSULE | Freq: Two times a day (BID) | ORAL | Status: DC
  Administered 2014-08-07 – 2014-08-11 (×8): 250 mg via ORAL
  Filled 2014-08-07 (×10): qty 1

## 2014-08-07 NOTE — ED Provider Notes (Signed)
Patient has continued nausea with attempting to eat for the past one week. She's had multiple episodes of diarrhea each day coming every 1.5 hours yesterday and 4 loose bowel movements today she reports blood mixed with mucus. Last bowel movement today was brown, nonbloody. She also complains of mild diffuse abdominal discomfort.  Orlie Dakin, MD 08/07/14 1339

## 2014-08-07 NOTE — H&P (Addendum)
[facility] History and Physical  [name] SWF:[id number] DOB: January 06, 1941 DOA: 08/07/2014  Referring physician: EDP PCP: Kandice Hams, MD   Chief Complaint: weakness, diarrhea   HPI: [name] is a 74 y.o. female with past medical history of ulcerative colitis, CAD status post CABG, carotid artery disease status post CEA was started on clindamycin for toe infection 2 weeks ago subsequently developed severe diarrhea and clindamycin was stopped. She was seen in the [facility] office on Thursday, was tested for C. difficile which was negative she was started on Forrester and a trial of Uceric. Unfortunately still continues having loose stools almost every hour, intermittently has noticed blood in her stools. Due to progressive weakness and worsening diarrhea presented to the ER today. She reports some mild lower abdominal pain, no fevers. In the ER noted to have a white count of 13.5, otherwise labs are unremarkable  Review of Systems: Positives bolded Constitutional:  No weight loss, night sweats, Fevers, chills, fatigue.  HEENT:  No headaches, Difficulty swallowing,Tooth/dental problems,Sore throat,  No sneezing, itching, ear ache, nasal congestion, post nasal drip,  Cardio-vascular:  No chest pain, Orthopnea, PND, swelling in lower extremities, anasarca, dizziness, palpitations  GI:  No heartburn, indigestion, abdominal pain, nausea, vomiting, diarrhea, change in bowel habits, loss of appetite  Resp:  No shortness of breath with exertion or at rest. No excess mucus, no productive cough, No non-productive cough, No coughing up of blood.No change in color of mucus.No wheezing.No chest wall deformity  Skin:  no rash or lesions.  GU:  no dysuria, change in color of urine, no urgency or frequency. No flank pain.  Musculoskeletal:  No joint pain or swelling. No decreased range of motion. No back pain.  Psych:  No change in mood or affect. No depression or anxiety. No  memory loss.   Past Medical History  Diagnosis Date  . CAD (coronary artery disease)     unspecified  . Cerebrovascular disease   . Hyperlipidemia, mixed   . HTN (hypertension)   . Ulcerative colitis   . Diverticulitis   . Colon polyps 2006  . Thyroid disease   . Myocardial infarct   . Carotid artery occlusion 2010  . Diabetes mellitus without complication     Borderline   Past Surgical History  Procedure Laterality Date  . Cholecystectomy  7.06.2008    with cholangiogram  . 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 Abdominal hysterectomy    . Carotid endarterectomy     Social History:  reports that she quit smoking about 17 months ago. Her smoking use included Cigarettes. She has a 12 pack-year smoking history. She has never used smokeless tobacco. She reports that she does not drink alcohol or use illicit drugs.  Allergies  Allergen Reactions  . Clindamycin/Lincomycin Diarrhea  . Ivp Dye [Iodinated Diagnostic Agents] Other (See Comments)    "almost passed out"    Family History  Problem Relation Age of Onset  . Colitis Brother     undefined  . Stomach cancer Mother   . Stroke Father     died  . Colon cancer Neg Hx      Prior to Admission medications   Medication Sig Start Date End Date Taking? Authorizing Provider  aspirin EC 81 MG tablet Take 81 mg by mouth as needed for mild pain.    Yes Historical Provider, MD  CRESTOR 40 MG tablet TAKE 1 TABLET AT BEDTIME 05/17/14  Yes Lelon Perla, MD  hydrochlorothiazide (MICROZIDE) 12.5 MG capsule take 1 capsule by mouth once daily 05/18/14  Yes Lelon Perla, MD  meloxicam (MOBIC) 15 MG tablet Take 1 tablet by mouth daily as needed for pain.  07/26/14  Yes Historical Provider, MD  mesalamine (LIALDA) 1.2 G EC tablet take 4 tablets by mouth once daily 07/31/14  Yes Inda Castle, MD  metoprolol tartrate (LOPRESSOR) 25 MG tablet take 1 tablet by mouth twice a day 05/18/14  Yes Lelon Perla, MD  potassium chloride  20 MEQ TBCR Take one capsule twice daily for 5 days. 08/04/14  Yes Amy S [name], PA-C  predniSONE (DELTASONE) 10 MG tablet Take 2 tablets (20 mg total) by mouth 2 (two) times daily. 08/06/14  Yes Milus Banister, MD  saccharomyces boulardii (FLORASTOR) 250 MG capsule Take 1 capsule (250 mg total) by mouth 2 (two) times daily. 08/03/14  Yes Amy S [name], PA-C  valsartan (DIOVAN) 160 MG tablet TAKE 1 TABLET DAILY 06/05/14  Yes Lelon Perla, MD  clindamycin (CLEOCIN) 300 MG capsule Take 1 capsule (300 mg total) by mouth 4 (four) times daily. X 7 days Patient not taking: Reported on 08/07/2014 07/22/14   Wandra Arthurs, MD   Physical Exam: Filed Vitals:   08/07/14 1230 08/07/14 1245 08/07/14 1256 08/07/14 1345  BP: 115/62 120/70 120/70 123/47  Pulse: 64 64 64 61  Temp:      TempSrc:      Resp:   18   Height:      Weight:      SpO2: 94% 97% 99% 94%    Wt Readings from Last 3 Encounters:  08/07/14 102.967 kg (227 lb)  08/03/14 103.08 kg (227 lb 4 oz)  07/22/14 99.791 kg (220 lb)    General:  Elderly, well-nourished pleasant African-American female , AAO 3, no distress  Eyes: PERRL, normal lids, irises & conjunctiva ENT: grossly normal hearing, lips & tongue Neck: no LAD, masses or thyromegaly Cardiovascular: RRR, no m/r/g. No LE edema. Telemetry: SR, no arrhythmias  Respiratory: CTA bilaterally, no w/r/r. Normal respiratory effort. Abdomen: soft, mildly distended, nontender, increased bowel sounds Skin: no rash or induration seen on limited exam Musculoskeletal: grossly normal tone BUE/BLE Psychiatric: grossly normal mood and affect, speech fluent and appropriate Neurologic: grossly non-focal.          Labs on Admission:  Basic Metabolic Panel:  Recent Labs Lab 08/03/14 1513 08/07/14 1201  NA 134* 133*  K 3.1* 3.2*  CL 99 99*  CO2 25 22  GLUCOSE 93 153*  BUN 15 15  CREATININE 1.30* 1.07*  CALCIUM 9.6 8.7*   Liver Function Tests:  Recent Labs Lab 08/07/14 1201    AST 18  ALT 12*  ALKPHOS 71  BILITOT 0.6  PROT 6.9  ALBUMIN 2.8*    Recent Labs Lab 08/07/14 1201  LIPASE 13*   No results for input(s): AMMONIA in the last 168 hours. CBC:  Recent Labs Lab 08/03/14 1513 08/07/14 1201  WBC 8.1 13.5*  NEUTROABS 5.8 11.6*  HGB 14.5 14.0  HCT 44.3 40.9  MCV 94.1 91.1  PLT 185.0 231   Cardiac Enzymes: No results for input(s): CKTOTAL, CKMB, CKMBINDEX, TROPONINI in the last 168 hours.  BNP (last 3 results) No results for input(s): BNP in the last 8760 hours.  ProBNP (last 3 results) No results for input(s): PROBNP in the last 8760 hours.  CBG: No results for input(s): GLUCAP in the last 168 hours.  Radiological Exams on Admission:  No results found.   Assessment/Plan  1. Acute on chronic diarrhea -The background of chronic left-sided ulcerative colitis -Given worsening of diarrhea and leukocytosis I would favor getting a CT abdomen pelvis with contrast, however has IVP dye allergy, will await GI input -Her C. difficile PCR from 3 days ago was negative, off antibiotics now. -Her gastroenterologist was consulted by EDP, defer need for steroids to GI -continue mesalamine and floraster  2. Essential hypertension -stable, hold HCTZ and valstartan, continue lopressor  3. Coronary atherosclerosis -stable, continue Lopressor and statin, hold aspirin  4. Dehydration -Due to acute on chronic diarrhea, will hydrate  5. Hyperglycemia -due to recent steroids, monitor   Code Status: Full Code DVT Prophylaxis: SCDS Family Communication:none at bedside  Time spent: 45min  Abdimalik Mayorquin [facility] Pager [phone]

## 2014-08-07 NOTE — ED Provider Notes (Signed)
CSN: [id number]     Arrival date & time 08/07/14  1133 History   First MD Initiated Contact with Patient 08/07/14 1223     Chief Complaint  Patient presents with  . Abdominal Pain  . Nausea     (Consider location/radiation/quality/duration/timing/severity/associated sxs/prior Treatment) HPI Pt is a 74yo female with hx of CAD, CVD, hyperlipidemia, HTN, ulcerative colitis, diverticulitis, colon polyps, thyroid disease, and DM, presenting to E with c/o gradually worsening RLQ abdominal pain.  Pt was seen by Nicoletta Ba, PA-C with [facility] on 08/03/14 for RLQ abdominal pain, nausea and severe diarrhea with episodes every 1.5-2hrs.  Pt was advised to start Florastor BID, trial of Uceris 9mg  PO daily for 6-8 weeks, and continue 4.8g daily.  Pt had test for C. Diff, came back negative. Pt states abdominal pain, nausea and diarrhea started after she was on Cleocin for cellulitis of her foot. Pt has since discontinued the antibiotic due to the diarrhea.  Today, pain is aching and sore, mild to moderate in severity, no pain or nausea medication taken today. Hx is limited by pt as pt continued to refer to her daughter to provide additional hx of medications.  States she does have blood and mucous in stool but it is intermittent, today's episodes were watery diarrhea w/o blood.  PCP: Dr. Babs [facility]: Dr. [facility]  Past Medical History  Diagnosis Date  . CAD (coronary artery disease)     unspecified  . Cerebrovascular disease   . Hyperlipidemia, mixed   . HTN (hypertension)   . Ulcerative colitis   . Diverticulitis   . Colon polyps 2006  . Thyroid disease   . Myocardial infarct   . Carotid artery occlusion 2010  . Diabetes mellitus without complication     Borderline   Past Surgical History  Procedure Laterality Date  . Cholecystectomy  7.06.2008    with cholangiogram  . 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 Abdominal hysterectomy    . Carotid  endarterectomy     Family History  Problem Relation Age of Onset  . Colitis Brother     undefined  . Stomach cancer Mother   . Stroke Father     died  . Colon cancer Neg Hx    History  Substance Use Topics  . Smoking status: Former Smoker -- 0.30 packs/day for 40 years    Types: Cigarettes    Quit date: 02/17/2013  . Smokeless tobacco: Never Used  . Alcohol Use: No   OB History    No data available     Review of Systems  Constitutional: Negative for fever, chills, diaphoresis, appetite change and fatigue.  Respiratory: Negative for cough and shortness of breath.   Cardiovascular: Negative for chest pain, palpitations and leg swelling.  Gastrointestinal: Positive for nausea, abdominal pain and diarrhea. Negative for vomiting and constipation.  Genitourinary: Negative for dysuria, frequency, hematuria and flank pain.  Musculoskeletal: Negative for myalgias and back pain.  All other systems reviewed and are negative.     Allergies  Clindamycin/lincomycin and Ivp dye  Home Medications   Prior to Admission medications   Medication Sig Start Date End Date Taking? Authorizing Provider  aspirin EC 81 MG tablet Take 81 mg by mouth as needed for mild pain.    Yes Historical Provider, MD  CRESTOR 40 MG tablet TAKE 1 TABLET AT BEDTIME 05/17/14  Yes Lelon Perla, MD  hydrochlorothiazide (MICROZIDE) 12.5 MG capsule take 1 capsule by mouth  once daily 05/18/14  Yes Lelon Perla, MD  meloxicam (MOBIC) 15 MG tablet Take 1 tablet by mouth daily as needed for pain.  07/26/14  Yes Historical Provider, MD  mesalamine (LIALDA) 1.2 G EC tablet take 4 tablets by mouth once daily 07/31/14  Yes Inda Castle, MD  metoprolol tartrate (LOPRESSOR) 25 MG tablet take 1 tablet by mouth twice a day 05/18/14  Yes Lelon Perla, MD  potassium chloride 20 MEQ TBCR Take one capsule twice daily for 5 days. 08/04/14  Yes Amy S [name], PA-C  predniSONE (DELTASONE) 10 MG tablet Take 2 tablets (20 mg  total) by mouth 2 (two) times daily. 08/06/14  Yes Milus Banister, MD  saccharomyces boulardii (FLORASTOR) 250 MG capsule Take 1 capsule (250 mg total) by mouth 2 (two) times daily. 08/03/14  Yes Amy S [name], PA-C  valsartan (DIOVAN) 160 MG tablet TAKE 1 TABLET DAILY 06/05/14  Yes Lelon Perla, MD  clindamycin (CLEOCIN) 300 MG capsule Take 1 capsule (300 mg total) by mouth 4 (four) times daily. X 7 days Patient not taking: Reported on 08/07/2014 07/22/14   Wandra Arthurs, MD   BP 123/47 mmHg  Pulse 61  Temp(Src) 98.1 F (36.7 C) (Oral)  Resp 18  Ht 5\' 9"  (1.753 m)  Wt 227 lb (102.967 kg)  BMI 33.51 kg/m2  SpO2 94% Physical Exam  Constitutional: She appears well-developed and well-nourished. No distress.  Pt lying comfortably in exam bed, NAD.   HENT:  Head: Normocephalic and atraumatic.  Eyes: Conjunctivae are normal. No scleral icterus.  Neck: Normal range of motion. Neck supple.  Cardiovascular: Normal rate, regular rhythm and normal heart sounds.   Pulmonary/Chest: Effort normal and breath sounds normal. No respiratory distress. She has no wheezes. She has no rales. She exhibits no tenderness.  Abdominal: Soft. Bowel sounds are normal. She exhibits no distension and no mass. There is no tenderness. There is no rebound and no guarding.  Soft, non-distended. Mild diffuse tenderness. No rebound or guarding. No CVAT  Musculoskeletal: Normal range of motion.  Neurological: She is alert.  Skin: Skin is warm and dry. She is not diaphoretic.  Nursing note and vitals reviewed.   ED Course  Procedures (including critical care time) Labs Review Labs Reviewed  CBC WITH DIFFERENTIAL/PLATELET - Abnormal; Notable for the following:    WBC 13.5 (*)    Neutrophils Relative % 87 (*)    Neutro Abs 11.6 (*)    Lymphocytes Relative 7 (*)    All other components within normal limits  COMPREHENSIVE METABOLIC PANEL - Abnormal; Notable for the following:    Sodium 133 (*)    Potassium 3.2 (*)     Chloride 99 (*)    Glucose, Bld 153 (*)    Creatinine, Ser 1.07 (*)    Calcium 8.7 (*)    Albumin 2.8 (*)    ALT 12 (*)    GFR calc non Af Amer 49 (*)    GFR calc Af Amer 57 (*)    All other components within normal limits  LIPASE, BLOOD - Abnormal; Notable for the following:    Lipase 13 (*)    All other components within normal limits  URINALYSIS, ROUTINE W REFLEX MICROSCOPIC (NOT AT [facility]) - Abnormal; Notable for the following:    Protein, ur 100 (*)    All other components within normal limits  URINE MICROSCOPIC-ADD ON - Abnormal; Notable for the following:    Squamous Epithelial / LPF  FEW (*)    Bacteria, UA FEW (*)    Casts GRANULAR CAST (*)    All other components within normal limits    Imaging Review No results found.   EKG Interpretation None      MDM   Final diagnoses:  Dehydration  Generalized abdominal pain  Diarrhea  History of colitis  Hypokalemia  Hyponatremia    Pt is a 74yo female directed by Dr. Deatra Ina, [place], [facility] to come to ED for worsening abdominal pain, nausea and diarrhea with generalized weakness. Pt seen last week by Michail Sermon, PA at [facility]. Pt had a negative C. Diff test, started on medications for her colitis.  No improvement. Pt is non-toxic appearing, afebrile. Diffuse abdominal tenderness w/o rebound or guarding.   12:33 PM Pt declined pain or nausea medication stating "I just want a cold drink of water"   Labs: slight leukocytosis with WBC-13.5, mild hypokalemia with K- 3.2, up from 3.1 four days ago.  1:43 PM Consulted with Azucena Freed, PA for [facility], will review pt's chart to help determine additional imaging or treatment.  Discussed pt with Dr. Winfred Leeds who also examined pt. Recommends pt be admitted for mild dehydration as well as generalized weakness and worsening abdominal pain.      2:13 PM Consulted with Dr. Broadus John, [facility], agreed to have pt admitted to a med-surg bed.  Pt is in stable condition at  this time.       Noland Fordyce, PA-C 08/07/14 [place], MD 08/07/14 [phone]

## 2014-08-07 NOTE — Progress Notes (Signed)
Per MD Patient was negative for C. DIFF at outpatient appointment one week ago, currently no order for C. Diff test at this time, will continue to monitor patient.

## 2014-08-07 NOTE — Telephone Encounter (Signed)
Stool studies are negative for C. difficile toxin.  At this point I think she needs EGD evaluation because of progressive weakness and bleeding.

## 2014-08-07 NOTE — ED Notes (Signed)
Pt here for abd pain, N,V,D. Sts hx of colitis.

## 2014-08-07 NOTE — Consult Note (Signed)
[facility] Consult: 1:44 PM 08/07/2014     Referring Provider: ED PA:  Noland Fordyce PA-C Primary Care Physician:  Kandice Hams, MD Primary Gastroenterologist:  Dr. Deatra Ina.      Reason for Consultation:  Bloody diarrhea, weakness. Marland Kitchen    HPI: [name] is a 74 y.o. female.  PMH CAD, MI, CABG 2006, bil carotid artery disease/sp CEA, glucose intolerance/"prediabetes" Hx left sided UC, diagnosed 2006.  On chronic Lialda 4.8 grams daily.  Most recent colonoscopy Sept 2014:scattered left sided tics and focal left sided colitis, mild cryptitis on Bx.   Rx with Cleocin 07/22/14 for cellulitis of right 5th toe.  Severe diarrhea developed after a few days.  Intermittently bloody stools.  Seen in GI office 08/03/14  with > one week diarrhea: loose, mucoid, bloody stools.  BMs every 60 to 90 minutes. +anorexia without N/V.  No fever/chills.  CDiff PCR negative 6/16.  GI stopped the Cleocin started Florastor and trial of Uceric, continued Lialda. Reminded pt to avoid Mobic, but she had not been using this.  Rx for doses of Po Potassium added on 6/17 and advised to hold HCTZ and Valsartan for next 2 days, after labs showed potassium of 3.1, creatinine of 1.3 (c/w 1.0 on 6/4)  Contacted Dr Ardis Hughs by phone 6/19 c/o progressive weakness, bloody diarrhea, poor po intake.  She declined suggestion to go to ED for eval and fluids so MD rx'd Prednisne 20 mg BID.  Has had 2 doses so far. Took total of 3 Imodium yesterday, no antidiarrheals before this.  Today is in ED with ongoing sxs.  Not a lot of abd pain, just "sore".  Blood with stool is intermittent, none yesterday but recurred this AM. Has been drinking liquids but no solid po intake for 3 days.  Suspects weight loss but has not been on a scale. Not oliguric.   Potassium  level in now 3.2.  Glucose is 15. Creatinine is 1.0, BUN remains normal. WBCs now 13.5, was 8.1 5 days ago.   No GI imaging so far.    Past Medical History  Diagnosis Date  . CAD (coronary artery disease)     unspecified  . Cerebrovascular disease   . Hyperlipidemia, mixed   . HTN (hypertension)   . Ulcerative colitis   . Diverticulitis   . Colon polyps 2006  . Thyroid disease   . Myocardial infarct   . Carotid artery occlusion   . Diabetes mellitus without complication     Borderline    Past Surgical History  Procedure Laterality Date  . Cholecystectomy  7.06.2008    with cholangiogram  . 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 Abdominal hysterectomy    . Carotid endarterectomy      Prior to Admission medications   Medication Sig Start Date End Date Taking? Authorizing Provider  aspirin EC 81 MG tablet Take 81 mg by mouth as needed for mild pain.    Yes Historical Provider, MD  CRESTOR 40 MG tablet TAKE 1 TABLET AT BEDTIME 05/17/14  Yes Aaron Edelman  Jacalyn Lefevre, MD  hydrochlorothiazide (MICROZIDE) 12.5 MG capsule take 1 capsule by mouth once daily 05/18/14  Yes Lelon Perla, MD  meloxicam (MOBIC) 15 MG tablet Take 1 tablet by mouth daily as needed for pain.  07/26/14  Yes Historical Provider, MD  mesalamine (LIALDA) 1.2 G EC tablet take 4 tablets by mouth once daily 07/31/14  Yes Inda Castle, MD  metoprolol tartrate (LOPRESSOR) 25 MG tablet take 1 tablet by mouth twice a day 05/18/14  Yes Lelon Perla, MD  potassium chloride 20 MEQ TBCR Take one capsule twice daily for 5 days. 08/04/14  Yes Amy S [name], PA-C  predniSONE (DELTASONE) 10 MG tablet Take 2 tablets (20 mg total) by mouth 2 (two) times daily. 08/06/14  Yes Milus Banister, MD  saccharomyces boulardii (FLORASTOR) 250 MG capsule Take 1 capsule (250 mg total) by mouth 2 (two) times daily. 08/03/14  Yes Amy S [name], PA-C  valsartan (DIOVAN) 160 MG tablet TAKE 1 TABLET DAILY 06/05/14  Yes Lelon Perla, MD    clindamycin (CLEOCIN) 300 MG capsule Take 1 capsule (300 mg total) by mouth 4 (four) times daily. X 7 days Patient not taking: Reported on 08/07/2014 07/22/14   Wandra Arthurs, MD    Scheduled Meds:   Infusions: . sodium chloride     PRN Meds:    Allergies as of 08/07/2014 - Review Complete 08/07/2014  Allergen Reaction Noted  . Clindamycin/lincomycin Diarrhea 08/07/2014  . Ivp dye [iodinated diagnostic agents] Other (See Comments) 01/14/2011    Family History  Problem Relation Age of Onset  . Colitis Brother     undefined  . Stomach cancer Mother   . Stroke Father     died  . Colon cancer Neg Hx     History   Social History  . Marital Status: Married    Spouse Name: N/A  . Number of Children: N/A  . Years of Education: N/A   Occupational History  . retired    Social History Main Topics  . Smoking status: Former Smoker -- 0.30 packs/day for 40 years    Types: Cigarettes    Quit date: 02/17/2013  . Smokeless tobacco: Never Used  . Alcohol Use: No  . Drug Use: No  . Sexual Activity: Not on file   Other Topics Concern  . Not on file   Social History Narrative    REVIEW OF SYSTEMS: Constitutional:  Weakness.  ENT:  No nose bleeds Pulm:  Some DOE CV:  No palpitations, no LE edema.  No chest pain.  Denies having undergone carotid endarterectomy. Do not see any notes in Epic re her carotid disease since OV with vascular surgeon Dr Drucie Opitz in 2012. GU:  No hematuria, no frequency.  No oliguria GI:  No dysphagia, no pyrosis.  Heme:  No unusual bleeding or bruising.  No issues with anemia.    Transfusions:  None ever Neuro:  No headaches, no peripheral tingling or numbness.  No dizziness or presyncope.  No blurry vision.  No asymmetric limb weakness Derm:  No itching, no rash or sores.  Endocrine:  No sweats or chills.  No polyuria or dysuria Immunization:  Not queried.  Travel:  None beyond local counties in last few months.    PHYSICAL EXAM: Vital signs  in last 24 hours: Filed Vitals:   08/07/14 1256  BP: 120/70  Pulse: 64  Temp:   Resp: 18   Wt Readings from Last 3 Encounters:  08/07/14  227 lb (102.967 kg)  08/03/14 227 lb 4 oz (103.08 kg)  07/22/14 220 lb (99.791 kg)    General: pleasant, elderly AAF.  Comfortable, non-toxic.  Looks tired but is very alert.  Head:  No asymmetry or swelling  Eyes:  No icterus or conj pallor.  eomi Ears:  Not HOH  Nose:  No congestion or discharge Mouth:  Clear, moist, no lesions. Neck:  No JVD, no TMG, no masses. Lungs:  Diminished but clear bil.  No cough or labored breathing Heart: RRR.  No MRG.  S1/S2 audible.  Abdomen:  Soft, NT, ND.  BS hypoactive.  .   Rectal: deferred   Musc/Skeltl: no joint swelling, redness or deformity.  Extremities:  No CCE.  Feet warm.  No redness or tenderness at right 5th toe.  Neurologic:  Oriented x 3.  No limb weakness.  No tremor.  Good historian.  Skin:  No telnagectasia, sores or rash. Tattoos:  none Nodes:  No cervical adenopathy.    Psych:  Pleasant, cooperative, calm, appropriate.    LAB RESULTS:  Recent Labs  08/07/14 1201  WBC 13.5*  HGB 14.0  HCT 40.9  PLT 231   BMET Lab Results  Component Value Date   NA 133* 08/07/2014   NA 134* 08/03/2014   NA 139 07/22/2014   K 3.2* 08/07/2014   K 3.1* 08/03/2014   K 3.5 07/22/2014   CL 99* 08/07/2014   CL 99 08/03/2014   CL 103 07/22/2014   CO2 22 08/07/2014   CO2 25 08/03/2014   CO2 25 07/22/2014   GLUCOSE 153* 08/07/2014   GLUCOSE 93 08/03/2014   GLUCOSE 85 07/22/2014   BUN 15 08/07/2014   BUN 15 08/03/2014   BUN 19 07/22/2014   CREATININE 1.07* 08/07/2014   CREATININE 1.30* 08/03/2014   CREATININE 1.00 07/22/2014   CALCIUM 8.7* 08/07/2014   CALCIUM 9.6 08/03/2014   CALCIUM 8.6* 07/22/2014   LFT  Recent Labs  08/07/14 1201  PROT 6.9  ALBUMIN 2.8*  AST 18  ALT 12*  ALKPHOS 71  BILITOT 0.6   PT/INR Lab Results  Component Value Date   INR 1.0 08/21/2006    Lipase      Component Value Date/Time   LIPASE 13* 08/07/2014 1201       IMPRESSION:   *  Bloody diarrhea in pt with long-standing but previously well controlled ulcerative colitis.  sxs began post initiation of Cleocin.  however C diff PCR negative 6/16.    *  Hyperglycemia insetting of oral prednisone added yesterday.  Previous hx of glucose intolerance.   *  Hypokalemia.    *  AKI: mild.    PLAN:     *  Added 1/2 NS with 30 of KCL at 166ml/hour.  Has already received 1 liter of NS.    *  Clear trays.   *  Po potassium. 40 meq now.  BMET in AM.   *  ? Flex sig vs full colonoscopy tomorrow?.       ? CT abdomen/pelvis?       Defer decision to Dr Carlean Purl.   *  ? IV solumedrol vs continue Uceris?       Don't think she is so toxic that she needs empiric abx.      Azucena Freed  08/07/2014, 1:44 PM Pager: [phone]     [facility] Attending  I have also seen and assessed the patient and agree with the advanced practitioner's assessment and plan.  Hx and PE same by me. I will do a flex sig in AM or PM tomorrow to sort out what is going on.  The risks and benefits as well as alternatives of endoscopic procedure(s) have been discussed and reviewed. All questions answered. The patient agrees to proceed.  Extra K also  Gatha Mayer, MD, [facility] [phone] (pager) 08/07/2014 4:16 PM

## 2014-08-07 NOTE — Telephone Encounter (Signed)
Spoke with the patient. She states she is worse. She has vomiting and diarrhea. No energy. Feels weak. She allowed me to speak with her daughter. Daughter agrees her mother is weak. Mother does not want to go to the Er because she doesn't feel she can sit up that long to wait to be seen. Suggested the Urgent care at [facility] if she refuses to go to the ER.

## 2014-08-07 NOTE — Progress Notes (Signed)
NURSING PROGRESS NOTE  [name] [id number] Admission Data: 08/07/2014 4:08 PM Attending Provider: Domenic Polite, MD FKC:LEXNTZ,GYFVCB D, MD Code Status: Full  Allergies:  Clindamycin/lincomycin and Ivp dye Past Medical History:   has a past medical history of CAD (coronary artery disease); Cerebrovascular disease; Hyperlipidemia, mixed; HTN (hypertension); Ulcerative colitis; Diverticulitis; Colon polyps (2006); Thyroid disease; Myocardial infarct; Carotid artery occlusion (2010); and Diabetes mellitus without complication. Past Surgical History:   has past surgical history that includes Cholecystectomy (7.06.2008); Coronary artery bypass graft (1.10.2006); and Abdominal hysterectomy. Social History:   reports that she quit smoking about 17 months ago. Her smoking use included Cigarettes. She has a 12 pack-year smoking history. She has never used smokeless tobacco. She reports that she does not drink alcohol or use illicit drugs.  [name] is a 74 y.o. female patient admitted from ED:   Last Documented Vital Signs: Blood pressure 128/64, pulse 64, temperature 98.1 F (36.7 C), temperature source Oral, resp. rate 18, height 5\' 9"  (1.753 m), weight 104.237 kg (229 lb 12.8 oz), SpO2 94 %.  Cardiac Monitoring:  None ordered.  IV Fluids:  IV in place, occlusive dsg intact without redness, IV cath forearm right, condition patent and no redness normal saline.   Skin: WDL  Patient/Family orientated to room. Information packet given to patient/family. Admission inpatient armband information verified with patient/family to include name and date of birth and placed on patient arm. Side rails up x 2, fall assessment and education completed with patient/family. Patient/family able to verbalize understanding of risk associated with falls and verbalized understanding to call for assistance before getting out of bed. Call light within reach. Patient/family able to voice and demonstrate  understanding of unit orientation instructions.    Will continue to evaluate and treat per MD orders.   Hendricks Limes RN, BS, BSN

## 2014-08-08 ENCOUNTER — Encounter (HOSPITAL_COMMUNITY): Payer: Self-pay

## 2014-08-08 ENCOUNTER — Encounter (HOSPITAL_COMMUNITY)
Admission: EM | Disposition: A | Discharge status: Home / Self Care | Payer: Self-pay | Source: Home / Self Care | Attending: Internal Medicine

## 2014-08-08 DIAGNOSIS — T3695XA Adverse effect of unspecified systemic antibiotic, initial encounter: Secondary | ICD-10-CM

## 2014-08-08 DIAGNOSIS — E860 Dehydration: Secondary | ICD-10-CM

## 2014-08-08 DIAGNOSIS — K521 Toxic gastroenteritis and colitis: Secondary | ICD-10-CM | POA: Diagnosis present

## 2014-08-08 HISTORY — PX: FLEXIBLE SIGMOIDOSCOPY: SHX5431

## 2014-08-08 LAB — COMPREHENSIVE METABOLIC PANEL
ALT: 11 U/L — ABNORMAL LOW (ref 14–54)
ANION GAP: 8 (ref 5–15)
AST: 17 U/L (ref 15–41)
Albumin: 2.7 g/dL — ABNORMAL LOW (ref 3.5–5.0)
Alkaline Phosphatase: 65 U/L (ref 38–126)
BILIRUBIN TOTAL: 0.4 mg/dL (ref 0.3–1.2)
BUN: 14 mg/dL (ref 6–20)
CHLORIDE: 104 mmol/L (ref 101–111)
CO2: 22 mmol/L (ref 22–32)
CREATININE: 0.8 mg/dL (ref 0.44–1.00)
Calcium: 8.3 mg/dL — ABNORMAL LOW (ref 8.9–10.3)
GFR calc Af Amer: >60 mL/min (ref >60)
Glucose, Bld: 95 mg/dL (ref 65–99)
Potassium: 3.8 mmol/L (ref 3.5–5.1)
Sodium: 134 mmol/L — ABNORMAL LOW (ref 135–145)
Total Protein: 6.3 g/dL — ABNORMAL LOW (ref 6.5–8.1)

## 2014-08-08 LAB — CBC
HCT: 38.6 % (ref 36.0–46.0)
HEMOGLOBIN: 13.3 g/dL (ref 12.0–15.0)
MCH: 31.7 pg (ref 26.0–34.0)
MCHC: 34.5 g/dL (ref 30.0–36.0)
MCV: 92.1 fL (ref 78.0–100.0)
Platelets: 217 10*3/uL (ref 150–400)
RBC: 4.19 MIL/uL (ref 3.87–5.11)
RDW: 13.2 % (ref 11.5–15.5)
WBC: 12.9 10*3/uL — AB (ref 4.0–10.5)

## 2014-08-08 SURGERY — SIGMOIDOSCOPY, FLEXIBLE
Anesthesia: Moderate Sedation

## 2014-08-08 MED ORDER — SODIUM CHLORIDE 0.9 % IV SOLN: INTRAVENOUS | Status: DC

## 2014-08-08 MED ORDER — FENTANYL CITRATE (PF) 100 MCG/2ML IJ SOLN
INTRAMUSCULAR | Status: DC | PRN
  Administered 2014-08-08 (×2): 25 ug via INTRAVENOUS

## 2014-08-08 MED ORDER — VANCOMYCIN 50 MG/ML ORAL SOLUTION
125.0000 mg | Freq: Four times a day (QID) | ORAL | Status: DC
  Administered 2014-08-08 – 2014-08-10 (×9): 125 mg via ORAL
  Filled 2014-08-08 (×13): qty 2.5

## 2014-08-08 MED ORDER — SODIUM CHLORIDE 0.45 % IV SOLN
INTRAVENOUS | Status: DC
  Filled 2014-08-08: qty 1000

## 2014-08-08 MED ORDER — METHYLPREDNISOLONE SODIUM SUCC 40 MG IJ SOLR
40.0000 mg | INTRAMUSCULAR | Status: DC
  Administered 2014-08-08 – 2014-08-10 (×3): 40 mg via INTRAVENOUS
  Filled 2014-08-08 (×4): qty 1

## 2014-08-08 MED ORDER — POTASSIUM CHLORIDE 2 MEQ/ML IV SOLN
INTRAVENOUS | Status: DC
  Administered 2014-08-08 – 2014-08-09 (×4): via INTRAVENOUS
  Filled 2014-08-08 (×12): qty 1000

## 2014-08-08 MED ORDER — MIDAZOLAM HCL 10 MG/2ML IJ SOLN
INTRAMUSCULAR | Status: DC | PRN
  Administered 2014-08-08: 2 mg via INTRAVENOUS
  Administered 2014-08-08 (×2): 1 mg via INTRAVENOUS

## 2014-08-08 MED ORDER — FENTANYL CITRATE (PF) 100 MCG/2ML IJ SOLN
INTRAMUSCULAR | Status: AC
  Filled 2014-08-08: qty 2

## 2014-08-08 MED ORDER — MIDAZOLAM HCL 5 MG/ML IJ SOLN
INTRAMUSCULAR | Status: AC
  Filled 2014-08-08: qty 2

## 2014-08-08 MED ORDER — KCL IN DEXTROSE-NACL 40-5-0.45 MEQ/L-%-% IV SOLN: INTRAVENOUS | Status: DC

## 2014-08-08 MED ORDER — DIPHENHYDRAMINE HCL 50 MG/ML IJ SOLN
INTRAMUSCULAR | Status: AC
  Filled 2014-08-08: qty 1

## 2014-08-08 NOTE — Progress Notes (Signed)
Utilization Review completed. Lajada Janes RN BSN CM 

## 2014-08-08 NOTE — Op Note (Signed)
[facility], [id number]   FLEXIBLE SIGMOIDOSCOPY PROCEDURE REPORT  PATIENT: [name], [name]  MR#: [id number] BIRTHDATE: [birth year] , 23  yrs. old GENDER: female ENDOSCOPIST: Gatha Mayer, MD, [facility] PROCEDURE DATE:  08/08/2014 PROCEDURE:   Sigmoidoscopy with biopsy ASA CLASS:   Class III INDICATIONS:bloody diarrhea, has UC and also had clindamycin recently. MEDICATIONS: Fentanyl 50 mcg IV and Versed 3 mg IV  DESCRIPTION OF PROCEDURE:   After the risks benefits and alternatives of the procedure were thoroughly explained, informed consent was obtained.  Digital exam revealed tenderness of the rectum and Digital exam revealed stenosis of the anal canal. The endoscope was introduced through the anus  and advanced to the descending colon , The exam was Without limitations.    The quality of the prep was The overall prep quality was adequate. . Estimated blood loss is zero unless otherwise noted in this procedure report. The instrument was then slowly withdrawn as the mucosa was fully examined.         COLON FINDINGS: 1) diffuse edema, granular and friable mucosa rectum to 40 cm - looks like UC - biopsied 2) Pseudomembranes over inflamed but mostly NL mucosa 40-70 cm - biopsies taken.    Retroflexion was not performed.    The scope was then withdrawn from the patient and the procedure terminated.  COMPLICATIONS: There were no immediate complications.  ENDOSCOPIC IMPRESSION: 1) diffuse edema, granular and friable mucosa rectum to 40 cm - looks like UC - biopsied 2) Pseudomembranes over inflamed but mostly NL mucosa 40-70 cm - biopsies taken  RECOMMENDATIONS: Start vancomycin and solumedrol - I am suspicious of pseudomembranous colitis (can get non C diff pseudomemnbranous colitis) but she also seems to have UC, ? 1 dx or 2  enteric precautions  will follow-up  low res diet    eSigned:  Gatha Mayer, MD, [facility]  08/08/2014 11:24 AM

## 2014-08-08 NOTE — Progress Notes (Signed)
Patient ID: [name], female   DOB: 23-Nov-1940, 74 y.o.   MRN: [id number]  [facility] PROGRESS NOTE  [name] MEQ:[id number] DOB: 1940-07-04 DOA: 08/07/2014 PCP: Kandice Hams, MD   Brief narrative:    74 y.o. female with known history of ulcerative colitis, CAD status post CABG, carotid artery disease status post CEA, recently started on clindamycin for toe infection 2 weeks ago and subsequently developed severe diarrhea and clindamycin was stopped. Pt was seen by GI doctor, C. Diff negative and pt started on Florastor. She presented to [facility] ED as her symptoms have not improved.   Assessment/Plan:    Active Problems: Bloody diarrhea - in pt with known history of UC - plan on flex sig this afternoon - appreciate GI team assistance  - keep NPO for now - continue IVF with KCl to keep K ~4 - repeat CBC in AM Hypokalemia - supplement as noted above - repeat BMP in AM Acute renal insufficiency   - secondary to pre renal etiology from diarrhea - IVF have been provided and Cr is now WNL  - repeat again in AM to make sure it remains stable  HTN - reasonable inpatient control Bradycardia - from Metoprolol, asymptomatic - monitor on tele for 24 more hours  HLD - continue statin  Obesity - Body mass index is 33.92 kg/(m^2).  DVT prophylaxis - SCD's  Code Status: Full.  Family Communication:  plan of care discussed with the patient Disposition Plan: Home when stable.   IV access:  Peripheral IV  Procedures and diagnostic studies:    Dg Foot Complete Right 07/22/2014   No acute osseous abnormalities.   Medical Consultants:  GI  Other Consultants:  None  IAnti-Infectives:   None  Faye Ramsay, MD  [facility] Pager [phone]  If 7PM-7AM, please contact night-coverage [email or web address] Password [email or web address] 08/08/2014, 10:50 AM   LOS: 1 day   HPI/Subjective: No events overnight.   Objective: Filed Vitals:   08/07/14 1529 08/07/14 2253 08/08/14 0538 08/08/14  1047  BP: 128/64 142/63 136/71   Pulse:  58 60 56  Temp: 98.1 F (36.7 C) 98 F (36.7 C) 97.8 F (36.6 C) 97.4 F (36.3 C)  TempSrc: Oral Oral Oral Oral  Resp:  20 16 17   Height: 5\' 9"  (1.753 m)     Weight: 104.237 kg (229 lb 12.8 oz)     SpO2: 94% 97% 99% 100%    Intake/Output Summary (Last 24 hours) at 08/08/14 1050 Last data filed at 08/08/14 0630  Gross per 24 hour  Intake 1551.67 ml  Output    100 ml  Net 1451.67 ml    Exam:   General:  Pt is alert, follows commands appropriately, not in acute distress  Cardiovascular: Regular rhythm, bradycardic, S1/S2, no rubs, no gallops  Respiratory: Clear to auscultation bilaterally, no wheezing, no crackles, no rhonchi  Abdomen: Soft, non tender, non distended, bowel sounds present, no guarding  Extremities: No edema, pulses DP and PT palpable bilaterally  Neuro: Grossly nonfocal  Data Reviewed: Basic Metabolic Panel:  Recent Labs Lab 08/03/14 1513 08/07/14 1201 08/08/14 0538  NA 134* 133* 134*  K 3.1* 3.2* 3.8  CL 99 99* 104  CO2 25 22 22   GLUCOSE 93 153* 95  BUN 15 15 14   CREATININE 1.30* 1.07* 0.80  CALCIUM 9.6 8.7* 8.3*   Liver Function Tests:  Recent Labs Lab 08/07/14 1201 08/08/14 0538  AST 18 17  ALT 12* 11*  ALKPHOS 71 65  BILITOT 0.6 0.4  PROT 6.9 6.3*  ALBUMIN 2.8* 2.7*    Recent Labs Lab 08/07/14 1201  LIPASE 13*   CBC:  Recent Labs Lab 08/03/14 1513 08/07/14 1201 08/08/14 0538  WBC 8.1 13.5* 12.9*  NEUTROABS 5.8 11.6*  --   HGB 14.5 14.0 13.3  HCT 44.3 40.9 38.6  MCV 94.1 91.1 92.1  PLT 185.0 231 217    Recent Results (from the past 240 hour(s))  Clostridium Difficile by PCR (not at [facility]     Status: None   Collection Time: 08/03/14  4:15 PM  Result Value Ref Range Status   C difficile by pcr Not Detected Not Detected Final     Scheduled Meds: . mesalamine  4.8 g Oral Q breakfast  . metoprolol tartrate  25 mg Oral BID  . rosuvastatin  40 mg Oral QHS  .  saccharomyces boulardii  250 mg Oral BID   Continuous Infusions: . sodium chloride 100 mL/hr at 08/08/14 0256

## 2014-08-08 NOTE — Telephone Encounter (Signed)
Patient did go to the ER at [facility]. See the consult note. She is scheduled for a flex-sig today.

## 2014-08-09 ENCOUNTER — Encounter (HOSPITAL_COMMUNITY): Payer: Self-pay | Admitting: Internal Medicine

## 2014-08-09 LAB — BASIC METABOLIC PANEL
Anion gap: 8 (ref 5–15)
BUN: 13 mg/dL (ref 6–20)
CO2: 18 mmol/L — ABNORMAL LOW (ref 22–32)
Calcium: 8.8 mg/dL — ABNORMAL LOW (ref 8.9–10.3)
Chloride: 108 mmol/L (ref 101–111)
Creatinine, Ser: 0.85 mg/dL (ref 0.44–1.00)
Glucose, Bld: 107 mg/dL — ABNORMAL HIGH (ref 65–99)
Potassium: 5 mmol/L (ref 3.5–5.1)
Sodium: 134 mmol/L — ABNORMAL LOW (ref 135–145)

## 2014-08-09 LAB — CBC
HCT: 41.1 % (ref 36.0–46.0)
Hemoglobin: 13.7 g/dL (ref 12.0–15.0)
MCH: 30.9 pg (ref 26.0–34.0)
MCHC: 33.3 g/dL (ref 30.0–36.0)
MCV: 92.8 fL (ref 78.0–100.0)
Platelets: 254 K/uL (ref 150–400)
RBC: 4.43 MIL/uL (ref 3.87–5.11)
RDW: 13.2 % (ref 11.5–15.5)
WBC: 17.6 K/uL — ABNORMAL HIGH (ref 4.0–10.5)

## 2014-08-09 MED ORDER — PRAMOXINE-ZINC OXIDE IN MO 1-12.5 % RE OINT
TOPICAL_OINTMENT | RECTAL | Status: DC | PRN
  Administered 2014-08-09 (×2): via RECTAL
  Filled 2014-08-09: qty 28.3

## 2014-08-09 MED ORDER — WITCH HAZEL-GLYCERIN EX PADS
MEDICATED_PAD | CUTANEOUS | Status: DC | PRN
  Administered 2014-08-09: 1 via TOPICAL
  Filled 2014-08-09: qty 100

## 2014-08-09 MED ORDER — DIPHENOXYLATE-ATROPINE 2.5-0.025 MG PO TABS
1.0000 | ORAL_TABLET | Freq: Three times a day (TID) | ORAL | Status: DC
  Administered 2014-08-09 – 2014-08-11 (×5): 1 via ORAL
  Filled 2014-08-09 (×5): qty 1

## 2014-08-09 NOTE — Progress Notes (Signed)
Patient ID: [name], female   DOB: [birth year], 74 y.o.   MRN: [id number]  [facility] PROGRESS NOTE  [name] [id number] DOB: 11-Nov-1940 DOA: 08/07/2014 PCP: Kandice Hams, MD   Brief narrative:    74 y.o. female with known history of ulcerative colitis, CAD status post CABG, carotid artery disease status post CEA, recently started on clindamycin for toe infection 2 weeks ago and subsequently developed severe diarrhea and clindamycin was stopped. Pt was seen by GI doctor, C. Diff negative and pt started on Florastor. She presented to [facility] ED as her symptoms have not improved.   Assessment/Plan:    Active Problems: Bloody diarrhea - s/p flex sig 08/08/2014, notable for diffuse edema, concerning for UC with pseudomembranes  - started on oral vancomycin 6/21 and solumedrol per GI recommendations  - appreciate GI team assistance  - WBC is up this AM but likely steroid induced  - advance diet as pt able to tolerate   Hypokalemia - supplemented and WNL this AM - will repeat BMP in AM  Acute renal insufficiency   - secondary to pre renal etiology from diarrhea - IVF have been provided and Cr is now WNL  - repeat again in AM to make sure it remains stable   HTN - reasonable inpatient control  Bradycardia - from Metoprolol, asymptomatic - monitor on tele for 24 more hours   HLD - continue statin   Obesity - Body mass index is 33.92 kg/(m^2).  DVT prophylaxis - SCD's  Code Status: Full.  Family Communication:  plan of care discussed with the patient Disposition Plan: Home when stable.   IV access:  Peripheral IV  Procedures and diagnostic studies:    Dg Foot Complete Right 07/22/2014   No acute osseous abnormalities.   Flex Sig 6/21 bu Dr. Carlean Purl  ENDOSCOPIC IMPRESSION: 1) diffuse edema, granular and friable mucosa rectum to 40 cm - looks like UC - biopsied 2) Pseudomembranes over inflamed but mostly NL mucosa 40-70 cm -biopsies  taken  RECOMMENDATIONS: Start vancomycin and solumedrol  Medical Consultants:  GI  Other Consultants:  None  IAnti-Infectives:   Oral vancomycin 6/21 -->   Faye Ramsay, MD  [facility] Pager [phone]  If 7PM-7AM, please contact night-coverage [email or web address] Password [facility] 08/09/2014, 12:17 PM   LOS: 2 days   HPI/Subjective: No events overnight. Still with diarrhea but less bloody   Objective: Filed Vitals:   08/08/14 1125 08/08/14 1411 08/08/14 2300 08/09/14 0512  BP: 102/46 122/49 130/62 119/58  Pulse: 53 70 58 53  Temp:  97.6 F (36.4 C)  97.9 F (36.6 C)  TempSrc:  Oral  Oral  Resp: 12 17  15   Height:      Weight:      SpO2: 94% 97%  97%    Intake/Output Summary (Last 24 hours) at 08/09/14 1217 Last data filed at 08/09/14 0854  Gross per 24 hour  Intake   1200 ml  Output      0 ml  Net   1200 ml    Exam:   General:  Pt is alert, follows commands appropriately, not in acute distress  Cardiovascular: Regular rhythm, bradycardic, no rubs, no gallops  Respiratory: Clear to auscultation bilaterally, no wheezing, no crackles, no rhonchi  Abdomen: Soft, non tender, non distended, bowel sounds present, no guarding  Data Reviewed: Basic Metabolic Panel:  Recent Labs Lab 08/03/14 1513 08/07/14 1201 08/08/14 0538 08/09/14 0600  NA 134* 133* 134* 134*  K 3.1* 3.2* 3.8  5.0  CL 99 99* 104 108  CO2 25 22 22  18*  GLUCOSE 93 153* 95 107*  BUN 15 15 14 13   CREATININE 1.30* 1.07* 0.80 0.85  CALCIUM 9.6 8.7* 8.3* 8.8*   Liver Function Tests:  Recent Labs Lab 08/07/14 1201 08/08/14 0538  AST 18 17  ALT 12* 11*  ALKPHOS 71 65  BILITOT 0.6 0.4  PROT 6.9 6.3*  ALBUMIN 2.8* 2.7*    Recent Labs Lab 08/07/14 1201  LIPASE 13*   CBC:  Recent Labs Lab 08/03/14 1513 08/07/14 1201 08/08/14 0538 08/09/14 0934  WBC 8.1 13.5* 12.9* 17.6*  NEUTROABS 5.8 11.6*  --   --   HGB 14.5 14.0 13.3 13.7  HCT 44.3 40.9 38.6 41.1  MCV 94.1 91.1 92.1  92.8  PLT 185.0 231 217 254    Recent Results (from the past 240 hour(s))  Clostridium Difficile by PCR (not at [facility])     Status: None   Collection Time: 08/03/14  4:15 PM  Result Value Ref Range Status   C difficile by pcr Not Detected Not Detected Final     Scheduled Meds: . mesalamine  4.8 g Oral Q breakfast  . methylPREDNISolone (SOLU-MEDROL) injection  40 mg Intravenous Q24H  . metoprolol tartrate  25 mg Oral BID  . rosuvastatin  40 mg Oral QHS  . saccharomyces boulardii  250 mg Oral BID  . vancomycin  125 mg Oral 4 times per day   Continuous Infusions: . sodium chloride 0.45 % with kcl 100 mL/hr at 08/09/14 0130

## 2014-08-09 NOTE — Progress Notes (Signed)
Daily Rounding Note  08/09/2014, 12:48 PM  LOS: 2 days   SUBJECTIVE:       Still having frequent, small volumes of bloody stool.  Not eating much as po (even water at this point) lead to rapid elimination.  Her rectum is sore. IVF at 100/hour, and no oliguria.   OBJECTIVE:         Vital signs in last 24 hours:    Temp:  [97.6 F (36.4 C)-97.9 F (36.6 C)] 97.9 F (36.6 C) (06/22 0512) Pulse Rate:  [53-70] 53 (06/22 0512) Resp:  [15-17] 15 (06/22 0512) BP: (119-130)/(49-62) 119/58 mmHg (06/22 0512) SpO2:  [97 %] 97 % (06/22 0512) Last BM Date: 08/09/14 Filed Weights   08/07/14 1143 08/07/14 1529  Weight: 227 lb (102.967 kg) 229 lb 12.8 oz (104.237 kg)   General: pleasant, comfortable.  Looks weak.    Heart: RRR Chest: clear bil.   Abdomen: soft, NT, active BS  Extremities: no CCE Neuro/Psych:  Pleasant, no gross deficits.  Oriented x 3.   Intake/Output from previous day: 06/21 0701 - 06/22 0700 In: 960 [P.O.:960] Out: -   Intake/Output this shift: Total I/O In: 240 [P.O.:240] Out: -   Lab Results:  Recent Labs  08/07/14 1201 08/08/14 0538 08/09/14 0934  WBC 13.5* 12.9* 17.6*  HGB 14.0 13.3 13.7  HCT 40.9 38.6 41.1  PLT 231 217 254   BMET  Recent Labs  08/07/14 1201 08/08/14 0538 08/09/14 0600  NA 133* 134* 134*  K 3.2* 3.8 5.0  CL 99* 104 108  CO2 22 22 18*  GLUCOSE 153* 95 107*  BUN 15 14 13   CREATININE 1.07* 0.80 0.85  CALCIUM 8.7* 8.3* 8.8*   LFT  Recent Labs  08/07/14 1201 08/08/14 0538  PROT 6.9 6.3*  ALBUMIN 2.8* 2.7*  AST 18 17  ALT 12* 11*  ALKPHOS 71 65  BILITOT 0.6 0.4   PT/INR No results for input(s): LABPROT, INR in the last 72 hours. Hepatitis Panel No results for input(s): HEPBSAG, HCVAB, HEPAIGM, HEPBIGM in the last 72 hours.  Studies/Results: No results found.   Scheduled Meds: . mesalamine  4.8 g Oral Q breakfast  . methylPREDNISolone (SOLU-MEDROL)  injection  40 mg Intravenous Q24H  . metoprolol tartrate  25 mg Oral BID  . rosuvastatin  40 mg Oral QHS  . saccharomyces boulardii  250 mg Oral BID  . vancomycin  125 mg Oral 4 times per day   Continuous Infusions: . sodium chloride 0.45 % with kcl 100 mL/hr at 08/09/14 0130   PRN Meds:.acetaminophen **OR** acetaminophen, ondansetron **OR** ondansetron (ZOFRAN) IV   ASSESMENT:   * Bloody diarrhea in pt with long-standing but previously well controlled ulcerative colitis. sxs began post initiation of Cleocin. however C diff PCR negative 6/16. Flex sig 6/22: diffuse colitis, looking like UC; pseudomembranes present.  Pathology:  1. Colon, biopsy, descending and sigmoid - MODERATELY ACTIVE CHRONIC COLITIS, CONSISTENT WITH INFLAMMATORY BOWEL DISEASE. - THERE IS NO HISTOLOGIC EVIDENCE OF C. DIFFICILE, DYSPLASIA, OR MALIGNANCY. - SEE COMMENT. 2. Colon, biopsy, sigmoid and rectal - MODERATELY TO FOCALLY SEVERELY ACTIVE CHRONIC COLITIS, CONSISTENT WITH INFLAMMATORY BOWEL DISEASE. - THERE IS NO EVIDENCE OF C. DIFFICILE, DYSPLASIA, OR MALIGNANCY. No improvement in sxs. WBCs may be rising due to the solumedrol. Pt has not had CT imaging.  * Hyperglycemia insetting of oral prednisone added yesterday. Previous hx of glucose intolerance.   * Hypokalemia.   * AKI:  mild.    PLAN   *  Need to collect the stool spec for C diff.  *  Continue IV solumedrol, po vanc and Eden Emms  08/09/2014, 12:48 PM Pager: [place] Attending  I have also seen and assessed the patient and agree with the advanced practitioner's assessment and plan. My Hx and PE same  She is being seen for left UC flare, diarrhea, bleeding and ? Of antibiotic associated colitis.  About the same today.  Bxs show IBD and not infection so probably does not have C diff though still wonder if there is a component of Abx-assoc diarrhea  Will recheck C diff PCR though probably neg again If  Neg again d/c vanc and use metronidazole in her Stay on IV steroids Add Lomotil TID [facility] Gatha Mayer, MD, [facility] [phone] (pager) 08/09/2014 3:48 PM

## 2014-08-10 DIAGNOSIS — A047 Enterocolitis due to Clostridium difficile: Secondary | ICD-10-CM

## 2014-08-10 LAB — BASIC METABOLIC PANEL
Anion gap: 8 (ref 5–15)
BUN: 9 mg/dL (ref 6–20)
CO2: 22 mmol/L (ref 22–32)
Calcium: 8.3 mg/dL — ABNORMAL LOW (ref 8.9–10.3)
Chloride: 104 mmol/L (ref 101–111)
Creatinine, Ser: 0.89 mg/dL (ref 0.44–1.00)
GFR calc non Af Amer: >60 mL/min (ref >60)
Glucose, Bld: 100 mg/dL — ABNORMAL HIGH (ref 65–99)
POTASSIUM: 4.7 mmol/L (ref 3.5–5.1)
Sodium: 134 mmol/L — ABNORMAL LOW (ref 135–145)

## 2014-08-10 LAB — CLOSTRIDIUM DIFFICILE BY PCR: Toxigenic C. Difficile by PCR: NEGATIVE

## 2014-08-10 MED ORDER — SODIUM CHLORIDE 0.9 % IV SOLN
INTRAVENOUS | Status: DC
  Administered 2014-08-10 – 2014-08-12 (×3): via INTRAVENOUS

## 2014-08-10 NOTE — Progress Notes (Signed)
PT reported moderate amount of blood in stool this am. Unable to assess amount d/t having flushed toilet. Provider notified with no new at present. Will continue to monitor stools for blood

## 2014-08-10 NOTE — Progress Notes (Signed)
Patient ID: [name], female   DOB: Apr 02, 1940, 74 y.o.   MRN: [id number]  [facility] PROGRESS NOTE  [name] [id number] DOB: [birth year] DOA: 08/07/2014 PCP: Kandice Hams, MD   Brief narrative:    74 y.o. female with known history of ulcerative colitis, CAD status post CABG, carotid artery disease status post CEA, recently started on clindamycin for toe infection 2 weeks ago and subsequently developed severe diarrhea and clindamycin was stopped. Pt was seen by GI doctor, C. Diff negative and pt started on Florastor. She presented to [facility] ED as her symptoms have not improved.   Assessment/Plan:    Active Problems: Bloody diarrhea - s/p flex sig 08/08/2014, notable for diffuse edema, concerning for UC with pseudomembranes  - started on oral vancomycin 6/21 and solumedrol per GI recommendations  - appreciate GI team assistance  - leukocytosis likely steroid induced  - diarrhea slowly improving   Hypokalemia - supplemented, BMP pending this AM  Acute renal insufficiency   - secondary to pre renal etiology from diarrhea - IVF have been provided and Cr is now WNL  - repeat again in AM to make sure it remains stable   HTN - reasonable inpatient control  Bradycardia - from Metoprolol, asymptomatic - pt asymptomatic, d/c telemetry   HLD - continue statin   Obesity - Body mass index is 33.92 kg/(m^2).  DVT prophylaxis - SCD's  Code Status: Full.  Family Communication:  plan of care discussed with the patient Disposition Plan: Home when off IV steroids   IV access:  Peripheral IV  Procedures and diagnostic studies:    Dg Foot Complete Right 07/22/2014   No acute osseous abnormalities.   Flex Sig 6/21 bu Dr. Carlean Purl  ENDOSCOPIC IMPRESSION: 1) diffuse edema, granular and friable mucosa rectum to 40 cm - looks like UC - biopsied 2) Pseudomembranes over inflamed but mostly NL mucosa 40-70 cm -biopsies taken  RECOMMENDATIONS: Start vancomycin and  solumedrol  Medical Consultants:  GI  Other Consultants:  None  IAnti-Infectives:   Oral vancomycin 6/21 -->   Faye Ramsay, MD  [facility] Pager [phone]  If 7PM-7AM, please contact night-coverage [email or web address] Password [facility] 08/10/2014, 11:15 AM   LOS: 3 days   HPI/Subjective: No events overnight. Still with diarrhea but less bloody   Objective: Filed Vitals:   08/09/14 0512 08/09/14 1504 08/09/14 2112 08/10/14 0520  BP: 119/58 130/54 125/60 139/54  Pulse: 53 55 64 55  Temp: 97.9 F (36.6 C) 98 F (36.7 C) 98 F (36.7 C) 98.7 F (37.1 C)  TempSrc: Oral Oral Oral Oral  Resp: 15 22 18    Height:      Weight:      SpO2: 97% 97% 100% 95%    Intake/Output Summary (Last 24 hours) at 08/10/14 1115 Last data filed at 08/10/14 0931  Gross per 24 hour  Intake    480 ml  Output      0 ml  Net    480 ml    Exam:   General:  Pt is alert, follows commands appropriately, not in acute distress  Cardiovascular: Regular rhythm, bradycardic, no rubs, no gallops  Respiratory: Clear to auscultation bilaterally, no wheezing, no crackles, no rhonchi  Abdomen: Soft, non tender, non distended, bowel sounds present, no guarding  Data Reviewed: Basic Metabolic Panel:  Recent Labs Lab 08/03/14 1513 08/07/14 1201 08/08/14 0538 08/09/14 0600  NA 134* 133* 134* 134*  K 3.1* 3.2* 3.8 5.0  CL 99 99* 104 108  CO2 25 22 22  18*  GLUCOSE 93 153* 95 107*  BUN 15 15 14 13   CREATININE 1.30* 1.07* 0.80 0.85  CALCIUM 9.6 8.7* 8.3* 8.8*   Liver Function Tests:  Recent Labs Lab 08/07/14 1201 08/08/14 0538  AST 18 17  ALT 12* 11*  ALKPHOS 71 65  BILITOT 0.6 0.4  PROT 6.9 6.3*  ALBUMIN 2.8* 2.7*    Recent Labs Lab 08/07/14 1201  LIPASE 13*   CBC:  Recent Labs Lab 08/03/14 1513 08/07/14 1201 08/08/14 0538 08/09/14 0934  WBC 8.1 13.5* 12.9* 17.6*  NEUTROABS 5.8 11.6*  --   --   HGB 14.5 14.0 13.3 13.7  HCT 44.3 40.9 38.6 41.1  MCV 94.1 91.1 92.1 92.8   PLT 185.0 231 217 254    Recent Results (from the past 240 hour(s))  Clostridium Difficile by PCR (not at [facility])     Status: None   Collection Time: 08/03/14  4:15 PM  Result Value Ref Range Status   C difficile by pcr Not Detected Not Detected Final     Scheduled Meds: . diphenoxylate-atropine  1 tablet Oral TID AC  . mesalamine  4.8 g Oral Q breakfast  . methylPREDNISolone (SOLU-MEDROL) injection  40 mg Intravenous Q24H  . metoprolol tartrate  25 mg Oral BID  . rosuvastatin  40 mg Oral QHS  . saccharomyces boulardii  250 mg Oral BID  . vancomycin  125 mg Oral 4 times per day   Continuous Infusions: . sodium chloride 100 mL/hr at 08/10/14 1022  . sodium chloride 0.45 % with kcl 100 mL/hr at 08/09/14 1423

## 2014-08-10 NOTE — Progress Notes (Signed)
          Daily Rounding Note  08/10/2014, 9:38 AM  LOS: 3 days   SUBJECTIVE:       On phone with lab.  No stool path panel or cdiff was ever sent.   Still not eating much of her soft diet.   OBJECTIVE:         Vital signs in last 24 hours:    Temp:  [98 F (36.7 C)-98.7 F (37.1 C)] 98.7 F (37.1 C) (06/23 0520) Pulse Rate:  [55-64] 55 (06/23 0520) Resp:  [18-22] 18 (06/22 2112) BP: (125-139)/(54-60) 139/54 mmHg (06/23 0520) SpO2:  [95 %-100 %] 95 % (06/23 0520) Last BM Date: 08/10/14 Filed Weights   08/07/14 1143 08/07/14 1529  Weight: 227 lb (102.967 kg) 229 lb 12.8 oz (104.237 kg)   General: pleasant, somewhat frail but not toxic.    Heart: RRR Chest: clear bil.  No cough or loabored breathing Abdomen: soft, ND, active BS.  Minor non-focal tenderness  Extremities: no CCE Neuro/Psych:  Pleasant, alert, relaxed.  Moving all 4s.   Intake/Output from previous day: 06/22 0701 - 06/23 0700 In: 480 [P.O.:480] Out: -   Intake/Output this shift: Total I/O In: 240 [P.O.:240] Out: -   Lab Results:  Recent Labs  08/07/14 1201 08/08/14 0538 08/09/14 0934  WBC 13.5* 12.9* 17.6*  HGB 14.0 13.3 13.7  HCT 40.9 38.6 41.1  PLT 231 217 254   BMET  Recent Labs  08/07/14 1201 08/08/14 0538 08/09/14 0600  NA 133* 134* 134*  K 3.2* 3.8 5.0  CL 99* 104 108  CO2 22 22 18*  GLUCOSE 153* 95 107*  BUN 15 14 13   CREATININE 1.07* 0.80 0.85  CALCIUM 8.7* 8.3* 8.8*   LFT  Recent Labs  08/07/14 1201 08/08/14 0538  PROT 6.9 6.3*  ALBUMIN 2.8* 2.7*  AST 18 17  ALT 12* 11*  ALKPHOS 71 65  BILITOT 0.6 0.4   Scheduled Meds: . diphenoxylate-atropine  1 tablet Oral TID AC  . mesalamine  4.8 g Oral Q breakfast  . methylPREDNISolone (SOLU-MEDROL) injection  40 mg Intravenous Q24H  . metoprolol tartrate  25 mg Oral BID  . rosuvastatin  40 mg Oral QHS  . saccharomyces boulardii  250 mg Oral BID  . vancomycin  125 mg  Oral 4 times per day   Continuous Infusions: . sodium chloride 0.45 % with kcl 100 mL/hr at 08/09/14 1423   PRN Meds:.acetaminophen **OR** acetaminophen, ondansetron **OR** ondansetron (ZOFRAN) IV, pramoxine-mineral oil-zinc, witch hazel-glycerin   ASSESMENT:    * Flare of ulcerative colitis. sxs began post initiation of Cleocin but C diff PCR negative 6/16. Flex sig 6/22: diffuse colitis, looking like UC; pseudomembranes present. Path: severe, active colitis, no Cdiff.  Repeat C digg PCR never sent.  Solumedrol, po vanc. TID Lomotil added to Lialda  Stools still bloody but less frequent.   *  Hypokalemia, corrected.  RN stopped the IVF with potassium.    PLAN   *  IVF change to NS. BMET in AM.       [name]  08/10/2014, 9:38 AM Pager: [phone]  Agree with Ms. Sandi Carne assessment and plan. Gatha Mayer, MD, [facility] Can dc vancomycin Must not be Varina - UC only  Gatha Mayer, MD, [facility] Gastroenter[facility] [phone] (pager) 08/10/2014 6:35 PM

## 2014-08-11 LAB — BASIC METABOLIC PANEL
ANION GAP: 13 (ref 5–15)
BUN: 10 mg/dL (ref 6–20)
CALCIUM: 8.8 mg/dL — AB (ref 8.9–10.3)
CO2: 18 mmol/L — ABNORMAL LOW (ref 22–32)
CREATININE: 0.85 mg/dL (ref 0.44–1.00)
Chloride: 104 mmol/L (ref 101–111)
GFR calc Af Amer: >60 mL/min (ref >60)
GFR calc non Af Amer: >60 mL/min (ref >60)
Glucose, Bld: 102 mg/dL — ABNORMAL HIGH (ref 65–99)
Potassium: 4.8 mmol/L (ref 3.5–5.1)
SODIUM: 135 mmol/L (ref 135–145)

## 2014-08-11 MED ORDER — METRONIDAZOLE 250 MG PO TABS
250.0000 mg | ORAL_TABLET | Freq: Four times a day (QID) | ORAL | Status: DC
  Filled 2014-08-11 (×4): qty 1

## 2014-08-11 MED ORDER — METHYLPREDNISOLONE SODIUM SUCC 40 MG IJ SOLR
30.0000 mg | Freq: Two times a day (BID) | INTRAMUSCULAR | Status: DC
  Administered 2014-08-11 – 2014-08-14 (×7): 30 mg via INTRAVENOUS
  Filled 2014-08-11 (×8): qty 0.75

## 2014-08-11 MED ORDER — HYDROCORTISONE 100 MG/60ML RE ENEM
100.0000 mg | ENEMA | Freq: Every day | RECTAL | Status: DC
  Administered 2014-08-11: 100 mg via RECTAL
  Filled 2014-08-11 (×2): qty 1

## 2014-08-11 MED ORDER — DIPHENOXYLATE-ATROPINE 2.5-0.025 MG PO TABS
2.0000 | ORAL_TABLET | Freq: Three times a day (TID) | ORAL | Status: DC
  Administered 2014-08-11 – 2014-08-14 (×12): 2 via ORAL
  Filled 2014-08-11 (×12): qty 2

## 2014-08-11 MED ORDER — ADULT MULTIVITAMIN W/MINERALS CH
1.0000 | ORAL_TABLET | Freq: Every day | ORAL | Status: DC
  Administered 2014-08-11 – 2014-08-21 (×11): 1 via ORAL
  Filled 2014-08-11 (×14): qty 1

## 2014-08-11 MED ORDER — METRONIDAZOLE IN NACL 5-0.79 MG/ML-% IV SOLN
500.0000 mg | Freq: Four times a day (QID) | INTRAVENOUS | Status: DC
  Administered 2014-08-11 – 2014-08-14 (×13): 500 mg via INTRAVENOUS
  Filled 2014-08-11 (×14): qty 100

## 2014-08-11 MED ORDER — SACCHAROMYCES BOULARDII 250 MG PO CAPS
250.0000 mg | ORAL_CAPSULE | Freq: Two times a day (BID) | ORAL | Status: DC
  Administered 2014-08-11 – 2014-08-21 (×21): 250 mg via ORAL
  Filled 2014-08-11 (×25): qty 1

## 2014-08-11 MED ORDER — BOOST / RESOURCE BREEZE PO LIQD
1.0000 | Freq: Three times a day (TID) | ORAL | Status: DC
  Administered 2014-08-11 – 2014-08-14 (×5): 1 via ORAL

## 2014-08-11 NOTE — Progress Notes (Signed)
Patient ID: [name], female   DOB: Sep 09, 1940, 74 y.o.   MRN: [id number]  [facility] PROGRESS NOTE  [name] ESP:[id number] DOB: 1940-05-07 DOA: 08/07/2014 PCP: Kandice Hams, MD   Brief narrative:    74 y.o. female with known history of ulcerative colitis, CAD status post CABG, carotid artery disease status post CEA, recently started on clindamycin for toe infection 2 weeks ago and subsequently developed severe diarrhea and clindamycin was stopped. Pt was seen by GI doctor, C. Diff negative and pt started on Florastor. She presented to [facility] ED as her symptoms have not improved.   Assessment/Plan:    Active Problems: Bloody diarrhea - s/p flex sig 08/08/2014, notable for diffuse edema, concerning for UC with pseudomembranes  - started on oral vancomycin 6/21 and solumedrol per GI recommendations  - Patient still concerned with multiple episodes of bloody diarrheas, 7 episodes this morning - Plan on starting Flagyl IV per GI team recommendations - Also increase the dose of Solu-Medrol and monitor clinical response - Per GI, may need to infliximab next week if she does not respond to this therapy - Continue probiotic, nutrition is consulted  Hypokalemia - supplemented, within normal limits this morning  Acute renal insufficiency   - secondary to pre renal etiology from diarrhea - IVF have been provided and Cr is now WNL  - repeat again in AM   HTN - reasonable inpatient control  Bradycardia - from Metoprolol, asymptomatic - pt asymptomatic, d/c telemetry   HLD - continue statin   Obesity - Body mass index is 33.92 kg/(m^2).  DVT prophylaxis - SCD's  Code Status: Full.  Family Communication:  plan of care discussed with the patient Disposition Plan: Home when off IV steroids   IV access:  Peripheral IV  Procedures and diagnostic studies:    Dg Foot Complete Right 07/22/2014   No acute osseous abnormalities.   Flex Sig 6/21 bu Dr. Carlean Purl  ENDOSCOPIC  IMPRESSION: 1) diffuse edema, granular and friable mucosa rectum to 40 cm - looks like UC - biopsied 2) Pseudomembranes over inflamed but mostly NL mucosa 40-70 cm -biopsies taken  RECOMMENDATIONS: Start vancomycin and solumedrol  Medical Consultants:  GI  Other Consultants:  None  IAnti-Infectives:   Oral vancomycin 6/21 -->   Faye Ramsay, MD  [facility] Pager [phone]  If 7PM-7AM, please contact night-coverage [email or web address] Password [facility] 08/11/2014, 12:57 PM   LOS: 4 days   HPI/Subjective: No events overnight. Still with bloody diarrhea.  Objective: Filed Vitals:   08/10/14 1549 08/10/14 2200 08/10/14 2202 08/11/14 0634  BP: 128/55 152/69 139/68 143/53  Pulse: 52 66 63 59  Temp:  98.3 F (36.8 C)  98.1 F (36.7 C)  TempSrc:  Oral  Oral  Resp: 18 17  16   Height:      Weight:      SpO2: 96% 96% 96% 95%    Intake/Output Summary (Last 24 hours) at 08/11/14 1257 Last data filed at 08/11/14 6256  Gross per 24 hour  Intake      0 ml  Output      0 ml  Net      0 ml    Exam:   General:  Pt is alert, follows commands appropriately, not in acute distress  Cardiovascular: Regular rhythm, bradycardic, no rubs, no gallops  Respiratory: Clear to auscultation bilaterally, no wheezing, no crackles, no rhonchi  Abdomen: Soft, non tender, non distended, bowel sounds present, no guarding  Data Reviewed: Basic Metabolic Panel:  Recent Labs Lab 08/07/14 1201 08/08/14 0538 08/09/14 0600 08/10/14 1500 08/11/14 0552  NA 133* 134* 134* 134* 135  K 3.2* 3.8 5.0 4.7 4.8  CL 99* 104 108 104 104  CO2 22 22 18* 22 18*  GLUCOSE 153* 95 107* 100* 102*  BUN 15 14 13 9 10   CREATININE 1.07* 0.80 0.85 0.89 0.85  CALCIUM 8.7* 8.3* 8.8* 8.3* 8.8*   Liver Function Tests:  Recent Labs Lab 08/07/14 1201 08/08/14 0538  AST 18 17  ALT 12* 11*  ALKPHOS 71 65  BILITOT 0.6 0.4  PROT 6.9 6.3*  ALBUMIN 2.8* 2.7*    Recent Labs Lab 08/07/14 1201  LIPASE 13*    CBC:  Recent Labs Lab 08/07/14 1201 08/08/14 0538 08/09/14 0934  WBC 13.5* 12.9* 17.6*  NEUTROABS 11.6*  --   --   HGB 14.0 13.3 13.7  HCT 40.9 38.6 41.1  MCV 91.1 92.1 92.8  PLT 231 217 254    Recent Results (from the past 240 hour(s))  Clostridium Difficile by PCR (not at [facility])     Status: None   Collection Time: 08/03/14  4:15 PM  Result Value Ref Range Status   C difficile by pcr Not Detected Not Detected Final     Scheduled Meds: . diphenoxylate-atropine  2 tablet Oral TID AC & HS  . feeding supplement (RESOURCE BREEZE)  1 Container Oral TID BM  . hydrocortisone  100 mg Rectal QHS  . mesalamine  4.8 g Oral Q breakfast  . methylPREDNISolone (SOLU-MEDROL) injection  30 mg Intravenous Q12H  . metoprolol tartrate  25 mg Oral BID  . metronidazole  500 mg Intravenous Q6H  . rosuvastatin  40 mg Oral QHS  . saccharomyces boulardii  250 mg Oral BID   Continuous Infusions: . sodium chloride 100 mL/hr at 08/10/14 1218  . sodium chloride 0.45 % with kcl 100 mL/hr at 08/09/14 1423

## 2014-08-11 NOTE — Progress Notes (Addendum)
Initial Nutrition Assessment  DOCUMENTATION CODES:  Obesity unspecified  INTERVENTION:  Boost Breeze, Snacks, MVI  Recommend allowing crackers, peanut butter, applesauce, bananas, pudding, yogurt and mashed potatoes in addition to all clear liquids  NUTRITION DIAGNOSIS:  Altered GI function related to chronic illness as evidenced by meal completion < 25%, other (see comment) (ongoing diarrhea).   GOAL:  Patient will meet greater than or equal to 90% of their needs   MONITOR:  PO intake, Supplement acceptance, Diet advancement, Weight trends, Labs, Other (Comment) (bowel function)  REASON FOR ASSESSMENT:  Consult Assessment of nutrition requirement/status  ASSESSMENT: 74 y.o. female with past medical history of ulcerative colitis, CAD status post CABG, carotid artery disease status post CEA was started on clindamycin for toe infection 2 weeks ago subsequently developed severe diarrhea and clindamycin was stopped. Unfortunately still continues having loose stools almost every hour, intermittently has noticed blood in her stools. Due to progressive weakness and worsening diarrhea presented to the ER.  Pt reports eating very little for the past week due to diarrhea; every time she takes a bite she feels she needs to run to the bathroom. Pt states she usually loves to eat, she denies any weight loss. Per nursing notes, pt ate 25-75% of some meals. Pt is now on clear liquids, drank 8 oz of Resource breeze at lunch. Pt very knowledgeable about foods to avoid during UC flare-up. Reviewed list of recommended foods for diarrhea from "Inflammatory Bowel Disease Nutrition Therapy" handout from the Academy of [employer]. RD recommended eating small frequent amounts. Agree with resource breeze supplement for now.   Labs reviewed.   Height:  Ht Readings from Last 1 Encounters:  08/07/14 5\' 9"  (1.753 m)    Weight:  Wt Readings from Last 1 Encounters:  08/07/14 229 lb 12.8  oz (104.237 kg)    Ideal Body Weight:  65.9 kg  Wt Readings from Last 10 Encounters:  08/07/14 229 lb 12.8 oz (104.237 kg)  08/03/14 227 lb 4 oz (103.08 kg)  07/22/14 220 lb (99.791 kg)  03/28/14 234 lb 1.6 oz (106.187 kg)  03/02/14 233 lb (105.688 kg)  05/17/13 224 lb 6.4 oz (101.787 kg)  03/04/13 223 lb (101.152 kg)  02/25/13 218 lb 8 oz (99.111 kg)  02/24/13 222 lb (100.699 kg)  12/03/12 220 lb 12.8 oz (100.154 kg)    BMI:  Body mass index is 33.92 kg/(m^2).  Estimated Nutritional Needs:  Kcal:  1800-2000  Protein:  85-100 grams  Fluid:  1.8-2 L/day  Skin:  Reviewed, no issues  Diet Order:  Diet clear liquid Room service appropriate?: Yes; Fluid consistency:: Thin  EDUCATION NEEDS:  No education needs identified at this time   Intake/Output Summary (Last 24 hours) at 08/11/14 1444 Last data filed at 08/11/14 0929  Gross per 24 hour  Intake      0 ml  Output      0 ml  Net      0 ml    Last BM:  6/24 per pt  Pryor Ochoa RD, LDN Inpatient Clinical Dietitian Pager: [phone] After Hours Pager: [phone]

## 2014-08-11 NOTE — Progress Notes (Signed)
Utilization Review completed. Camp Gopal RN BSN CM 

## 2014-08-11 NOTE — Progress Notes (Addendum)
Daily Rounding Note  08/11/2014, 9:47 AM  LOS: 4 days   SUBJECTIVE:       8 stools, bloody, overnight and this AM.  Still with increased stooling after po, so not eating.  Wants only broth and applesauce.  No abd pain.   Wonders if she needs rectal meds.  Says some sort of PR medication helped in past (mesalamine?, cort enema?)  OBJECTIVE:         Vital signs in last 24 hours:    Temp:  [98.1 F (36.7 C)-98.3 F (36.8 C)] 98.1 F (36.7 C) (06/24 0634) Pulse Rate:  [52-66] 59 (06/24 0634) Resp:  [16-18] 16 (06/24 0634) BP: (128-152)/(53-69) 143/53 mmHg (06/24 0634) SpO2:  [95 %-96 %] 95 % (06/24 0634) Last BM Date: 08/10/14 Filed Weights   08/07/14 1143 08/07/14 1529  Weight: 227 lb (102.967 kg) 229 lb 12.8 oz (104.237 kg)   General: looks ill,tired, not toxic.    Heart: RRR Chest: clear bil.  + dyspnea with speach  Abdomen: active BS.  ND.  NT.    Extremities: no CCE Neuro/Psych:  Cooperative, depressed, oriented x 3.  No obvious deficits.   Lab Results:  Recent Labs  08/09/14 0934  WBC 17.6*  HGB 13.7  HCT 41.1  PLT 254   BMET  Recent Labs  08/09/14 0600 08/10/14 1500 08/11/14 0552  NA 134* 134* 135  K 5.0 4.7 4.8  CL 108 104 104  CO2 18* 22 18*  GLUCOSE 107* 100* 102*  BUN 13 9 10   CREATININE 0.85 0.89 0.85  CALCIUM 8.8* 8.3* 8.8*    Studies/Results: No results found.   Scheduled Meds: . diphenoxylate-atropine  1 tablet Oral TID AC  . mesalamine  4.8 g Oral Q breakfast  . methylPREDNISolone (SOLU-MEDROL) injection  40 mg Intravenous Q24H  . metoprolol tartrate  25 mg Oral BID  . rosuvastatin  40 mg Oral QHS  . saccharomyces boulardii  250 mg Oral BID   Continuous Infusions: . sodium chloride 100 mL/hr at 08/10/14 1218  . sodium chloride 0.45 % with kcl 100 mL/hr at 08/09/14 1423   PRN Meds:.acetaminophen **OR** acetaminophen, ondansetron **OR** ondansetron (ZOFRAN) IV,  pramoxine-mineral oil-zinc, witch hazel-glycerin   ASSESMENT:   * Flare of ulcerative colitis. sxs began post initiation of Cleocin but C diff PCR negative 6/16, 6/23. Flex sig 6/22: diffuse colitis, looking like UC; pseudomembranes present. Path: severe, active colitis, no Cdiff.  On Solumedrol. TID Lomotil added to Lialda.  PO vanc stopped 6/23.  No real improvement.     PLAN   *  Add Metronidazole po,  ? Add cortenema? *  Up solumedrol to 30 mg BID.  *  Add resource supplement. Switch to clears with applesauce. Azucena Freed  08/11/2014, 9:47 AM Pager: [phone]  [place] [facility] Attending  I have also seen and assessed the patient and agree with the advanced practitioner's assessment and plan. My hx and PE same  She is anxious and out of sorts "I have never had a flare like this"  She is not responding to steroids yet. They are increasing her WBC Agree with metronidazole but will go IV first given diarrhea - maybe she does have some antibiotic induced diarrhea Increase solumedrol Will add rectal steroids If she is not better by next week need to think about rescoping/starting infliximab Am getting PPD and Hep B S ag Increase Lomotil Dietitian consult Florastor  Gatha Mayer, MD, [facility] Gastroenterology [phone] (pager) 08/11/2014 11:38 AM

## 2014-08-12 LAB — CBC
HEMATOCRIT: 36.8 % (ref 36.0–46.0)
HEMOGLOBIN: 12.2 g/dL (ref 12.0–15.0)
MCH: 30.3 pg (ref 26.0–34.0)
MCHC: 33.2 g/dL (ref 30.0–36.0)
MCV: 91.5 fL (ref 78.0–100.0)
Platelets: 220 10*3/uL (ref 150–400)
RBC: 4.02 MIL/uL (ref 3.87–5.11)
RDW: 13.2 % (ref 11.5–15.5)
WBC: 15.1 10*3/uL — ABNORMAL HIGH (ref 4.0–10.5)

## 2014-08-12 LAB — BASIC METABOLIC PANEL
Anion gap: 7 (ref 5–15)
BUN: 9 mg/dL (ref 6–20)
CO2: 21 mmol/L — AB (ref 22–32)
Calcium: 7.9 mg/dL — ABNORMAL LOW (ref 8.9–10.3)
Chloride: 104 mmol/L (ref 101–111)
Creatinine, Ser: 0.8 mg/dL (ref 0.44–1.00)
GFR calc Af Amer: >60 mL/min (ref >60)
GFR calc non Af Amer: >60 mL/min (ref >60)
Glucose, Bld: 129 mg/dL — ABNORMAL HIGH (ref 65–99)
POTASSIUM: 4 mmol/L (ref 3.5–5.1)
SODIUM: 132 mmol/L — AB (ref 135–145)

## 2014-08-12 MED ORDER — HYDROCORTISONE ACE-PRAMOXINE 1-1 % RE FOAM
1.0000 | Freq: Four times a day (QID) | RECTAL | Status: DC
  Administered 2014-08-12 – 2014-08-14 (×12): 1 via RECTAL
  Filled 2014-08-12 (×3): qty 10

## 2014-08-12 MED ORDER — METOPROLOL TARTRATE 12.5 MG HALF TABLET
12.5000 mg | ORAL_TABLET | Freq: Two times a day (BID) | ORAL | Status: DC
  Administered 2014-08-12 – 2014-08-21 (×18): 12.5 mg via ORAL
  Filled 2014-08-12 (×23): qty 1

## 2014-08-12 NOTE — Progress Notes (Signed)
Patient ID: [name], female   DOB: 1940/10/29, 74 y.o.   MRN: [id number]  [facility] PROGRESS NOTE  [name] [id number]:[id number] DOB: [birth year] DOA: 08/07/2014 PCP: Kandice Hams, MD   Brief narrative:    74 y.o. female with known history of ulcerative colitis, CAD status post CABG, carotid artery disease status post CEA, recently started on clindamycin for toe infection 2 weeks ago and subsequently developed severe diarrhea and clindamycin was stopped. Pt was seen by GI doctor, C. Diff negative and pt started on Florastor. She presented to [facility] ED as her symptoms have not improved.   Assessment/Plan:    Active Problems: Bloody diarrhea - s/p flex sig 08/08/2014, notable for diffuse edema, concerning for UC with pseudomembranes  - started on oral vancomycin 6/21 and solumedrol per GI recommendations  - pt reports feeling better  - Plan on starting Flagyl IV per GI team recommendations 6/24 - Also increased the dose of Solu-Medrol 6/24 - Continue probiotic, nutrition is consulted - advanced diet to regular   Hypokalemia - supplemented, within normal limits this morning  Acute renal insufficiency   - secondary to pre renal etiology from diarrhea - IVF have been provided and Cr is now WNL  - repeat again in AM   HTN - reasonable inpatient control  Bradycardia - from Metoprolol, asymptomatic, lower the dose of metoprolol  - pt asymptomatic, d/c telemetry   HLD - continue statin   Obesity - Body mass index is 33.92 kg/(m^2).  DVT prophylaxis - SCD's  Code Status: Full.  Family Communication:  plan of care discussed with the patient Disposition Plan: Home when off IV steroids   IV access:  Peripheral IV  Procedures and diagnostic studies:    Dg Foot Complete Right 07/22/2014   No acute osseous abnormalities.   Flex Sig 6/21 bu Dr. Carlean Purl  ENDOSCOPIC IMPRESSION: 1) diffuse edema, granular and friable mucosa rectum to 40 cm - looks like UC - biopsied 2)  Pseudomembranes over inflamed but mostly NL mucosa 40-70 cm -biopsies taken  RECOMMENDATIONS: Start vancomycin and solumedrol  Medical Consultants:  GI  Other Consultants:  None  IAnti-Infectives:   Oral vancomycin 6/21 -->  Flagyl 6/24 -->  [name] Ramsay, MD  [facility] Pager [phone]  If 7PM-7AM, please contact night-coverage [email or web address] Password [email or web address] 08/12/2014, 1:15 PM   LOS: 5 days   HPI/Subjective: No events overnight. Still with diarrhea, but better overall.   Objective: Filed Vitals:   08/11/14 0634 08/11/14 1433 08/11/14 2152 08/12/14 1314  BP: 143/53 129/59 147/55 135/56  Pulse: 59 68 51 49  Temp: 98.1 F (36.7 C) 98.1 F (36.7 C)  98.1 F (36.7 C)  TempSrc: Oral   Oral  Resp: 16 18 20 18   Height:      Weight:      SpO2: 95% 100% 98% 98%    Intake/Output Summary (Last 24 hours) at 08/12/14 1315 Last data filed at 08/12/14 1017  Gross per 24 hour  Intake   2000 ml  Output    200 ml  Net   1800 ml    Exam:   General:  Pt is alert, follows commands appropriately, not in acute distress  Cardiovascular: Regular rhythm, bradycardic, no rubs, no gallops  Respiratory: Clear to auscultation bilaterally, no wheezing, no crackles, no rhonchi  Abdomen: Soft, non tender, non distended, bowel sounds present, no guarding  Data Reviewed: Basic Metabolic Panel:  Recent Labs Lab 08/08/14 0538 08/09/14 0600 08/10/14 1500 08/11/14 0552 08/12/14 0550  NA 134* 134* 134* 135 132*  K 3.8 5.0 4.7 4.8 4.0  CL 104 108 104 104 104  CO2 22 18* 22 18* 21*  GLUCOSE 95 107* 100* 102* 129*  BUN 14 13 9 10 9   CREATININE 0.80 0.85 0.89 0.85 0.80  CALCIUM 8.3* 8.8* 8.3* 8.8* 7.9*   Liver Function Tests:  Recent Labs Lab 08/07/14 1201 08/08/14 0538  AST 18 17  ALT 12* 11*  ALKPHOS 71 65  BILITOT 0.6 0.4  PROT 6.9 6.3*  ALBUMIN 2.8* 2.7*    Recent Labs Lab 08/07/14 1201  LIPASE 13*   CBC:  Recent Labs Lab 08/07/14 1201 08/08/14 0538  08/09/14 0934 08/12/14 0550  WBC 13.5* 12.9* 17.6* 15.1*  NEUTROABS 11.6*  --   --   --   HGB 14.0 13.3 13.7 12.2  HCT 40.9 38.6 41.1 36.8  MCV 91.1 92.1 92.8 91.5  PLT 231 217 254 220    Recent Results (from the past 240 hour(s))  Clostridium Difficile by PCR (not at [facility])     Status: None   Collection Time: 08/03/14  4:15 PM  Result Value Ref Range Status   C difficile by pcr Not Detected Not Detected Final     Scheduled Meds: . diphenoxylate-atropine  2 tablet Oral TID AC & HS  . feeding supplement (RESOURCE BREEZE)  1 Container Oral TID BM  . hydrocortisone-pramoxine  1 applicator Rectal QID  . mesalamine  4.8 g Oral Q breakfast  . methylPREDNISolone (SOLU-MEDROL) injection  30 mg Intravenous Q12H  . metoprolol tartrate  25 mg Oral BID  . metronidazole  500 mg Intravenous Q6H  . multivitamin with minerals  1 tablet Oral Daily  . rosuvastatin  40 mg Oral QHS  . saccharomyces boulardii  250 mg Oral BID   Continuous Infusions: . sodium chloride 100 mL/hr at 08/12/14 1975

## 2014-08-12 NOTE — Progress Notes (Addendum)
   Patient Name: [name] Date of Encounter: 08/12/2014, 8:59 AM    Subjective  Slept all night but had incontinence when awakening after cortenema Overall better Frustrated about not getting correct diet Less diarrhea   Objective  BP 147/55 mmHg  Pulse 51  Temp(Src) 98.1 F (36.7 C) (Oral)  Resp 20  Ht 5\' 9"  (1.753 m)  Wt 229 lb 12.8 oz (104.237 kg)  BMI 33.92 kg/m2  SpO2 98% NAD Lungs cta abd NT  Hgb NL  Assessment and Plan  1) Ulcerative colitis flare - improved 2) ? Antibiotic assoc diarrhea - better   Continue IV steroids, Lomotil, Lialda and  and metronidazole Dc cortenema and start proctofoam Does not need daily labs I think  Thinking if she continues to improve try for oral steroids 1-2 d and home if ok x 1 d on those  Gatha Mayer, MD, [facility] [phone] (pager) 08/12/2014 8:59 AM

## 2014-08-13 MED ORDER — FUROSEMIDE 20 MG PO TABS
20.0000 mg | ORAL_TABLET | Freq: Once | ORAL | Status: AC
  Administered 2014-08-13: 20 mg via ORAL
  Filled 2014-08-13: qty 1

## 2014-08-13 NOTE — Progress Notes (Signed)
Patient had a bloody bowel movement tonight, bright red blood, moderate amount.  Patient denies any pain.  Will continue to monitor.  Wilson Singer, RN

## 2014-08-13 NOTE — Progress Notes (Signed)
   Patient Name: [name] Date of Encounter: 08/13/2014, 1:21 PM    Subjective  Doing better overall No sig diarrhea yeaterday but had some small bloody stools this AM No pain in abdomen and rectal pain better with proctofoam Nervous about pending dc   Objective  BP 134/56 mmHg  Pulse 54  Temp(Src) 98 F (36.7 C) (Oral)  Resp 18  Ht 5\' 9"  (1.753 m)  Wt 229 lb 12.8 oz (104.237 kg)  BMI 33.92 kg/m2  SpO2 99% Abd soft and NT Appropriate mood and affect    Assessment and Plan  1) Ulcerative colitis flare - improved 2) ? Antibiotic assoc diarrhea - better   Change to prednisone tomorrow Change to oral metronidazole and Tx x 10 days total Continue proctofoam Continue Lomotil Dc tomorrow or next day should be ok I think - she needs reassurance and understanding that will have sxs at dc but is better   Gatha Mayer, MD, [facility] [phone] (pager) 08/13/2014 1:21 PM

## 2014-08-13 NOTE — Progress Notes (Signed)
Patient ID: [name], female   DOB: 1940/06/26, 74 y.o.   MRN: [id number]  [facility] PROGRESS NOTE  [name] [id number]:[id number] DOB: 08-22-1940 DOA: 08/07/2014 PCP: Kandice Hams, MD   Brief narrative:    74 y.o. female with known history of ulcerative colitis, CAD status post CABG, carotid artery disease status post CEA, recently started on clindamycin for toe infection 2 weeks ago and subsequently developed severe diarrhea and clindamycin was stopped. Pt was seen by GI doctor, C. Diff negative and pt started on Florastor. She presented to [facility] ED as her symptoms have not improved.   Assessment/Plan:    Active Problems: Bloody diarrhea - s/p flex sig 08/08/2014, notable for diffuse edema, concerning for UC with pseudomembranes  - started on oral vancomycin 6/21 and solumedrol per GI recommendations  - pt reports feeling better  - Plan on starting Flagyl IV per GI team recommendations 6/24 - Also increased the dose of Solu-Medrol 6/24 - Continue probiotic, nutrition is consulted - advanced diet to regular and pt tolerating well, overall improving   Hypokalemia - supplemented, within normal limits 6/25  Acute renal insufficiency   - secondary to pre renal etiology from diarrhea - IVF have been provided and Cr is now WNL  - due to LE will stop IVF and will give dose of Lasix 20 mg PO x 1 dose 6/26 - weight today is 104 kg, check weight in AM  HTN - reasonable inpatient control  Bradycardia - from Metoprolol, asymptomatic, lower the dose of metoprolol  - pt asymptomatic, d/c telemetry   HLD - continue statin   Obesity - Body mass index is 33.92 kg/(m^2).  DVT prophylaxis - SCD's  Code Status: Full.  Family Communication:  plan of care discussed with the patient Disposition Plan: Home when off IV steroids   IV access:  Peripheral IV  Procedures and diagnostic studies:    Dg Foot Complete Right 07/22/2014   No acute osseous abnormalities.   Flex Sig 6/21 bu  Dr. Carlean Purl  ENDOSCOPIC IMPRESSION: 1) diffuse edema, granular and friable mucosa rectum to 40 cm - looks like UC - biopsied 2) Pseudomembranes over inflamed but mostly NL mucosa 40-70 cm -biopsies taken  RECOMMENDATIONS: Start vancomycin and solumedrol  Medical Consultants:  GI  Other Consultants:  None  IAnti-Infectives:   Oral vancomycin 6/21 -->  Flagyl 6/24 -->  Faye Ramsay, MD  [facility] Pager [phone]  If 7PM-7AM, please contact night-coverage [email or web address] Password [facility] 08/13/2014, 10:55 AM   LOS: 6 days   HPI/Subjective: No events overnight. Still with diarrhea, but better overall.   Objective: Filed Vitals:   08/11/14 2152 08/12/14 1314 08/12/14 2105 08/13/14 0511  BP: 147/55 135/56 142/60 134/56  Pulse: 51 49 54 54  Temp:  98.1 F (36.7 C) 98.4 F (36.9 C) 98 F (36.7 C)  TempSrc:  Oral Oral Oral  Resp: 20 18 18 18   Height:      Weight:      SpO2: 98% 98% 97% 99%    Intake/Output Summary (Last 24 hours) at 08/13/14 1055 Last data filed at 08/13/14 0956  Gross per 24 hour  Intake    860 ml  Output   1801 ml  Net   -941 ml    Exam:   General:  Pt is alert, follows commands appropriately, not in acute distress  Cardiovascular: Regular rhythm, bradycardic, no rubs, no gallops  Respiratory: Clear to auscultation bilaterally, no wheezing, no crackles, no rhonchi  Abdomen: Soft,  non tender, non distended, bowel sounds present, no guarding  Data Reviewed: Basic Metabolic Panel:  Recent Labs Lab 08/08/14 0538 08/09/14 0600 08/10/14 1500 08/11/14 0552 08/12/14 0550  NA 134* 134* 134* 135 132*  K 3.8 5.0 4.7 4.8 4.0  CL 104 108 104 104 104  CO2 22 18* 22 18* 21*  GLUCOSE 95 107* 100* 102* 129*  BUN 14 13 9 10 9   CREATININE 0.80 0.85 0.89 0.85 0.80  CALCIUM 8.3* 8.8* 8.3* 8.8* 7.9*   Liver Function Tests:  Recent Labs Lab 08/07/14 1201 08/08/14 0538  AST 18 17  ALT 12* 11*  ALKPHOS 71 65  BILITOT 0.6 0.4  PROT 6.9 6.3*   ALBUMIN 2.8* 2.7*    Recent Labs Lab 08/07/14 1201  LIPASE 13*   CBC:  Recent Labs Lab 08/07/14 1201 08/08/14 0538 08/09/14 0934 08/12/14 0550  WBC 13.5* 12.9* 17.6* 15.1*  NEUTROABS 11.6*  --   --   --   HGB 14.0 13.3 13.7 12.2  HCT 40.9 38.6 41.1 36.8  MCV 91.1 92.1 92.8 91.5  PLT 231 217 254 220    Recent Results (from the past 240 hour(s))  Clostridium Difficile by PCR (not at [facility])     Status: None   Collection Time: 08/03/14  4:15 PM  Result Value Ref Range Status   C difficile by pcr Not Detected Not Detected Final     Scheduled Meds: . diphenoxylate-atropine  2 tablet Oral TID AC & HS  . feeding supplement (RESOURCE BREEZE)  1 Container Oral TID BM  . furosemide  20 mg Oral Once  . hydrocortisone-pramoxine  1 applicator Rectal QID  . mesalamine  4.8 g Oral Q breakfast  . methylPREDNISolone (SOLU-MEDROL) injection  30 mg Intravenous Q12H  . metoprolol tartrate  12.5 mg Oral BID  . metronidazole  500 mg Intravenous Q6H  . multivitamin with minerals  1 tablet Oral Daily  . rosuvastatin  40 mg Oral QHS  . saccharomyces boulardii  250 mg Oral BID   Continuous Infusions:

## 2014-08-14 ENCOUNTER — Other Ambulatory Visit: Payer: Self-pay

## 2014-08-14 DIAGNOSIS — K5190 Ulcerative colitis, unspecified, without complications: Secondary | ICD-10-CM

## 2014-08-14 DIAGNOSIS — R197 Diarrhea, unspecified: Secondary | ICD-10-CM

## 2014-08-14 LAB — BASIC METABOLIC PANEL
Anion gap: 5 (ref 5–15)
BUN: 15 mg/dL (ref 6–20)
CHLORIDE: 103 mmol/L (ref 101–111)
CO2: 26 mmol/L (ref 22–32)
Calcium: 8.5 mg/dL — ABNORMAL LOW (ref 8.9–10.3)
Creatinine, Ser: 0.96 mg/dL (ref 0.44–1.00)
GFR, EST NON AFRICAN AMERICAN: 56 mL/min — AB (ref >60)
Glucose, Bld: 135 mg/dL — ABNORMAL HIGH (ref 65–99)
POTASSIUM: 4.4 mmol/L (ref 3.5–5.1)
Sodium: 134 mmol/L — ABNORMAL LOW (ref 135–145)

## 2014-08-14 MED ORDER — METRONIDAZOLE 500 MG PO TABS
500.0000 mg | ORAL_TABLET | Freq: Four times a day (QID) | ORAL | Status: DC
  Administered 2014-08-14 – 2014-08-21 (×27): 500 mg via ORAL
  Filled 2014-08-14 (×33): qty 1

## 2014-08-14 MED ORDER — DIPHENOXYLATE-ATROPINE 2.5-0.025 MG PO TABS
1.0000 | ORAL_TABLET | Freq: Three times a day (TID) | ORAL | Status: DC
  Administered 2014-08-14 – 2014-08-15 (×4): 1 via ORAL
  Filled 2014-08-14 (×4): qty 1

## 2014-08-14 MED ORDER — PREDNISONE 20 MG PO TABS
20.0000 mg | ORAL_TABLET | Freq: Two times a day (BID) | ORAL | Status: DC
  Administered 2014-08-14 – 2014-08-21 (×15): 20 mg via ORAL
  Filled 2014-08-14 (×19): qty 1

## 2014-08-14 MED ORDER — METRONIDAZOLE 500 MG PO TABS: 500.0000 mg | ORAL_TABLET | Freq: Four times a day (QID) | ORAL | Status: DC

## 2014-08-14 NOTE — Progress Notes (Signed)
    Progress Note   Subjective  ambulating in halls. Stools still loose but frequency has significantly increased. Minimal bleeding and minimal lower abdominal pain.    Objective   Vital signs in last 24 hours: Temp:  [97.4 F (36.3 C)-98.3 F (36.8 C)] 98.3 F (36.8 C) (06/27 0553) Pulse Rate:  [51-59] 51 (06/27 0553) Resp:  [17-18] 18 (06/27 0553) BP: (144-159)/(62-67) 159/67 mmHg (06/27 0553) SpO2:  [94 %-98 %] 95 % (06/27 0553) Last BM Date: 08/13/14 General:    Pleasant black female in NAD Heart:  Regular rate and rhythm Lungs: Respirations even and unlabored, lungs CTA bilaterally Abdomen:  Soft, nontender and nondistended. Normal bowel sounds. Extremities:  Without edema. Neurologic:  Alert and oriented,  grossly normal neurologically. Psych:  Cooperative. Normal mood and affect.  Lab Results:  Recent Labs  08/12/14 0550  WBC 15.1*  HGB 12.2  HCT 36.8  PLT 220   BMET  Recent Labs  08/12/14 0550 08/14/14 0455  NA 132* 134*  K 4.0 4.4  CL 104 103  CO2 21* 26  GLUCOSE 129* 135*  BUN 9 15  CREATININE 0.80 0.96  CALCIUM 7.9* 8.5*      Assessment / Plan:    1. Ulcerative colitis flare, significantly improved.  Will change to PO steroids  Continue proctofoam and lialda.   2. ? Antibiotic associated diarrhea (was on cleocin), continue flagyl for total of 10 days. Will change to PO. Will decrease imodium since much better. Can always be increased back if needed. Home later today or in am    LOS: 7 days   Tye Savoy  08/14/2014, 11:49 AM

## 2014-08-14 NOTE — Progress Notes (Signed)
Patient ID: [name], female   DOB: [birth year], 74 y.o.   MRN: [id number]  [facility] PROGRESS NOTE  [name] [id number] DOB: 07/12/1940 DOA: 08/07/2014 PCP: Kandice Hams, MD   Brief narrative:    74 y.o. female with known history of ulcerative colitis, CAD status post CABG, carotid artery disease status post CEA, recently started on clindamycin for toe infection 2 weeks ago and subsequently developed severe diarrhea and clindamycin was stopped. Pt was seen by GI doctor, C. Diff negative and pt started on Florastor. She presented to [facility] ED as her symptoms have not improved.   Assessment/Plan:    Active Problems: Bloody diarrhea - s/p flex sig 08/08/2014, notable for diffuse edema, concerning for UC with pseudomembranes  - started on oral vancomycin 6/21 and solumedrol per GI recommendations  - pt reports feeling better  - Plan on starting Flagyl IV per GI team recommendations 6/24 - plan to transition to oral Prednisone in AM per GI  - Continue probiotic  - advanced diet to regular and pt tolerating well, overall improving   Hypokalemia - supplemented, within normal limits 6/25  Acute renal insufficiency   - secondary to pre renal etiology from diarrhea - IVF have been provided and Cr is now WNL  - due to LE will stop IVF and will give dose of Lasix 20 mg PO x 1 dose 6/26 and 6/27 - weight today is 104 kg, check weight in AM  HTN - reasonable inpatient control  Bradycardia - from Metoprolol, asymptomatic, lower the dose of metoprolol  - pt asymptomatic, d/c telemetry   HLD - continue statin   Obesity - Body mass index is 33.92 kg/(m^2).  DVT prophylaxis - SCD's  Code Status: Full.  Family Communication:  plan of care discussed with the patient Disposition Plan: Home when off IV steroids   IV access:  Peripheral IV  Procedures and diagnostic studies:    Dg Foot Complete Right 07/22/2014   No acute osseous abnormalities.   Flex Sig 6/21 bu Dr.  Carlean Purl  ENDOSCOPIC IMPRESSION: 1) diffuse edema, granular and friable mucosa rectum to 40 cm - looks like UC - biopsied 2) Pseudomembranes over inflamed but mostly NL mucosa 40-70 cm -biopsies taken  RECOMMENDATIONS: Start vancomycin and solumedrol  Medical Consultants:  GI  Other Consultants:  None  IAnti-Infectives:   Oral vancomycin 6/21 -->  Flagyl 6/24 -->  Faye Ramsay, MD  [facility] Pager [phone]  If 7PM-7AM, please contact night-coverage [email or web address] Password [email or web address] 08/14/2014, 12:06 PM   LOS: 7 days   HPI/Subjective: No events overnight.   Objective: Filed Vitals:   08/13/14 0511 08/13/14 1520 08/13/14 2131 08/14/14 0553  BP: 134/56 144/62 150/66 159/67  Pulse: 54 59 57 51  Temp: 98 F (36.7 C) 98.2 F (36.8 C) 97.4 F (36.3 C) 98.3 F (36.8 C)  TempSrc: Oral Oral Oral Oral  Resp: 18 17 18 18   Height:      Weight:      SpO2: 99% 98% 94% 95%    Intake/Output Summary (Last 24 hours) at 08/14/14 1206 Last data filed at 08/14/14 0900  Gross per 24 hour  Intake   1000 ml  Output      0 ml  Net   1000 ml    Exam:   General:  Pt is alert, follows commands appropriately, not in acute distress  Cardiovascular: Regular rhythm, bradycardic, no rubs, no gallops  Respiratory: Clear to auscultation bilaterally, no wheezing, no crackles, no rhonchi  Abdomen: Soft, non tender, non distended, bowel sounds present, no guarding  Data Reviewed: Basic Metabolic Panel:  Recent Labs Lab 08/09/14 0600 08/10/14 1500 08/11/14 0552 08/12/14 0550 08/14/14 0455  NA 134* 134* 135 132* 134*  K 5.0 4.7 4.8 4.0 4.4  CL 108 104 104 104 103  CO2 18* 22 18* 21* 26  GLUCOSE 107* 100* 102* 129* 135*  BUN 13 9 10 9 15   CREATININE 0.85 0.89 0.85 0.80 0.96  CALCIUM 8.8* 8.3* 8.8* 7.9* 8.5*   Liver Function Tests:  Recent Labs Lab 08/08/14 0538  AST 17  ALT 11*  ALKPHOS 65  BILITOT 0.4  PROT 6.3*  ALBUMIN 2.7*   CBC:  Recent Labs Lab  08/08/14 0538 08/09/14 0934 08/12/14 0550  WBC 12.9* 17.6* 15.1*  HGB 13.3 13.7 12.2  HCT 38.6 41.1 36.8  MCV 92.1 92.8 91.5  PLT 217 254 220    Recent Results (from the past 240 hour(s))  Clostridium Difficile by PCR (not at [facility])     Status: None   Collection Time: 08/03/14  4:15 PM  Result Value Ref Range Status   C difficile by pcr Not Detected Not Detected Final     Scheduled Meds: . diphenoxylate-atropine  2 tablet Oral TID AC & HS  . feeding supplement (RESOURCE BREEZE)  1 Container Oral TID BM  . hydrocortisone-pramoxine  1 applicator Rectal QID  . mesalamine  4.8 g Oral Q breakfast  . methylPREDNISolone (SOLU-MEDROL) injection  30 mg Intravenous Q12H  . metoprolol tartrate  12.5 mg Oral BID  . metronidazole  500 mg Intravenous Q6H  . multivitamin with minerals  1 tablet Oral Daily  . rosuvastatin  40 mg Oral QHS  . saccharomyces boulardii  250 mg Oral BID   Continuous Infusions:

## 2014-08-15 MED ORDER — HYDROCORTISONE ACE-PRAMOXINE 1-1 % RE FOAM
1.0000 | Freq: Every day | RECTAL | Status: DC
  Administered 2014-08-17 – 2014-08-20 (×5): 1 via RECTAL
  Filled 2014-08-15: qty 10

## 2014-08-15 MED ORDER — DIPHENOXYLATE-ATROPINE 2.5-0.025 MG PO TABS
2.0000 | ORAL_TABLET | Freq: Three times a day (TID) | ORAL | Status: DC
  Administered 2014-08-15 – 2014-08-21 (×24): 2 via ORAL
  Filled 2014-08-15 (×26): qty 2

## 2014-08-15 NOTE — Progress Notes (Signed)
Patient ID: [name], female   DOB: [birth year], 74 y.o.   MRN: [id number]  [facility] PROGRESS NOTE  LAWONDA PRETLOW QAS:[id number] DOB: [birth year] DOA: 08/07/2014 PCP: Kandice Hams, MD   Brief narrative:    74 y.o. female with known history of ulcerative colitis, CAD status post CABG, carotid artery disease status post CEA, recently started on clindamycin for toe infection 2 weeks ago and subsequently developed severe diarrhea and clindamycin was stopped. Pt was seen by GI doctor, C. Diff negative and pt started on Florastor. She presented to [facility] ED as her symptoms have not improved.   Hospital course complicated by persistent bloody diarrhea, better after transitioning to Flagyl IV but has required prolonger IV Solumedrol. Pt has been transitioned to Prednisone 6/27 and has had few bloody movements overnight 6/27.  Assessment/Plan:    Active Problems: Bloody diarrhea - s/p flex sig 08/08/2014, notable for diffuse edema, concerning for UC with pseudomembranes  - started on oral vancomycin 6/21 and solumedrol per GI recommendations  - oral vancomycin has been discontinued 6/23  - pt started on Flagyl IV per GI team recommendations 6/24 and was discontinued 6/27 - Flagyl PO 500 mg was started on 6/27 and pt still taking PO, stop date after the last dose on July 3rd, 2016  - pt transitioned to oral prednisone 20 mg PO QD on 6/27 - overall pt reports feeling better but has several episodes of bloody diarrhea overnight - per GI team, not ready for d/c at this time  - repeat BMP in AM - continue Probiotic, Mesalamine   Hypokalemia - supplemented, within normal limits 6/27, repeat BMP in AM  Acute renal insufficiency   - secondary to pre renal etiology from diarrhea - IVF have been provided and Cr is now WNL  - due to LE, IVF stopped 6/25 and pt given one dose of Lasix 20 mg PO x 1 dose 6/26 and 6/27 - weight today is 104 kg, check weight in AM  HTN - reasonable inpatient  control  Bradycardia - from Metoprolol, asymptomatic, lower the dose of metoprolol  - pt asymptomatic, d/c telemetry   HLD - continue statin   Obesity - Body mass index is 33.92 kg/(m^2).  DVT prophylaxis - SCD's  Code Status: Full.  Family Communication:  plan of care discussed with the patient Disposition Plan: Home when cleared by GI team   IV access:  Peripheral IV  Procedures and diagnostic studies:    Dg Foot Complete Right 07/22/2014   No acute osseous abnormalities.   Flex Sig 6/21 bu Dr. Carlean Purl  ENDOSCOPIC IMPRESSION: 1) diffuse edema, granular and friable mucosa rectum to 40 cm - looks like UC - biopsied 2) Pseudomembranes over inflamed but mostly NL mucosa 40-70 cm -biopsies taken  RECOMMENDATIONS: Start vancomycin and solumedrol  Medical Consultants:  GI  Other Consultants:  None  IAnti-Infectives:   Oral vancomycin 6/21 --> 6/23 Flagyl 6/24 --> on IV until 6/27 at which point pt was transitioned to oral Flagyl   Faye Ramsay, MD  [facility] Pager [phone]  If 7PM-7AM, please contact night-coverage [email or web address] Password [facility] 08/15/2014, 4:44 PM   LOS: 8 days   HPI/Subjective: No events overnight. Several episodes of bloody diarrhea overnight.   Objective: Filed Vitals:   08/15/14 0519 08/15/14 1054 08/15/14 1056 08/15/14 1346  BP: 160/58 138/72 138/72 129/68  Pulse: 60  69 63  Temp: 98 F (36.7 C)   97.8 F (36.6 C)  TempSrc: Oral   Oral  Resp: 18   20  Height:      Weight:      SpO2: 96%   98%    Intake/Output Summary (Last 24 hours) at 08/15/14 1644 Last data filed at 08/15/14 1300  Gross per 24 hour  Intake    840 ml  Output      0 ml  Net    840 ml    Exam:   General:  Pt is alert, follows commands appropriately, not in acute distress  Cardiovascular: Regular rhythm, bradycardic, no rubs, no gallops  Respiratory: Clear to auscultation bilaterally, no wheezing, no crackles, no rhonchi  Abdomen: Soft, non tender,  non distended, bowel sounds present, no guarding  Data Reviewed: Basic Metabolic Panel:  Recent Labs Lab 08/09/14 0600 08/10/14 1500 08/11/14 0552 08/12/14 0550 08/14/14 0455  NA 134* 134* 135 132* 134*  K 5.0 4.7 4.8 4.0 4.4  CL 108 104 104 104 103  CO2 18* 22 18* 21* 26  GLUCOSE 107* 100* 102* 129* 135*  BUN 13 9 10 9 15   CREATININE 0.85 0.89 0.85 0.80 0.96  CALCIUM 8.8* 8.3* 8.8* 7.9* 8.5*   CBC:  Recent Labs Lab 08/09/14 0934 08/12/14 0550  WBC 17.6* 15.1*  HGB 13.7 12.2  HCT 41.1 36.8  MCV 92.8 91.5  PLT 254 220    Recent Results (from the past 240 hour(s))  Clostridium Difficile by PCR (not at [facility])     Status: None   Collection Time: 08/03/14  4:15 PM  Result Value Ref Range Status   C difficile by pcr Not Detected Not Detected Final     Scheduled Meds: . diphenoxylate-atropine  2 tablet Oral TID AC & HS  . feeding supplement (RESOURCE BREEZE)  1 Container Oral TID BM  . [START ON 08/16/2014] hydrocortisone-pramoxine  1 applicator Rectal QHS  . mesalamine  4.8 g Oral Q breakfast  . metoprolol tartrate  12.5 mg Oral BID  . metroNIDAZOLE  500 mg Oral 4 times per day  . multivitamin with minerals  1 tablet Oral Daily  . predniSONE  20 mg Oral BID  . rosuvastatin  40 mg Oral QHS  . saccharomyces boulardii  250 mg Oral BID   Continuous Infusions:

## 2014-08-15 NOTE — Progress Notes (Signed)
    Progress Note   Subjective  Having more diarrhea than she was yesterday   Objective   Vital signs in last 24 hours: Temp:  [98 F (36.7 C)-98.3 F (36.8 C)] 98 F (36.7 C) (06/28 0519) Pulse Rate:  [55-69] 60 (06/28 0519) Resp:  [18-20] 18 (06/28 0519) BP: (136-160)/(58-79) 160/58 mmHg (06/28 0519) SpO2:  [96 %-100 %] 96 % (06/28 0519) Last BM Date: 08/15/14 General:    Pleasant black female in NAD Abdomen:  Soft, nontender and nondistended. Normal bowel sounds. Neurologic:  Alert and oriented,  grossly normal neurologically. Psych:  Cooperative. Normal mood and affect.    Assessment / Plan:    Ulcerative colitis flare, improving. Changed to PO steroids and PO flagyl yesterday. Continue Lialda. Continue Protofoam but decrease to once daily at HS since having problems retaining them. Will increase antidiarrheal agent back to 6 daily (I decreased it yesterday)    LOS: 8 days   Tye Savoy  08/15/2014, 9:25 AM

## 2014-08-15 NOTE — Care Management (Signed)
Important Message  Patient Details  Name: [name] MRN: [id number] Date of Birth: Aug 23, 1940   Medicare Important Message Given:  Yes-second notification given    Nathen May 08/15/2014, 1:38 PM

## 2014-08-15 NOTE — Progress Notes (Signed)
She still having loose, watery stools though symptoms are slowly improving.  She is unable to retain even the Proctofoam.  May continue prednisone and lialda.  Can try Proctofoam daily at bedtime only.  Not ready for discharge.

## 2014-08-16 DIAGNOSIS — K51911 Ulcerative colitis, unspecified with rectal bleeding: Principal | ICD-10-CM

## 2014-08-16 LAB — BASIC METABOLIC PANEL
Anion gap: 12 (ref 5–15)
BUN: 13 mg/dL (ref 6–20)
CALCIUM: 8.6 mg/dL — AB (ref 8.9–10.3)
CHLORIDE: 97 mmol/L — AB (ref 101–111)
CO2: 26 mmol/L (ref 22–32)
CREATININE: 0.86 mg/dL (ref 0.44–1.00)
GFR calc non Af Amer: >60 mL/min (ref >60)
Glucose, Bld: 140 mg/dL — ABNORMAL HIGH (ref 65–99)
Potassium: 4.1 mmol/L (ref 3.5–5.1)
Sodium: 135 mmol/L (ref 135–145)

## 2014-08-16 NOTE — Progress Notes (Signed)
          Daily Rounding Note  08/16/2014, 10:28 AM  LOS: 9 days   SUBJECTIVE:       6 or so bloody stools yesterday.  None overnight.  3 today: still loose and bloody.  Able to retain cortenema better when given ~ midnight.  Less occurrence of post prandial fecal urgency , less abdominal pain.   OBJECTIVE:         Vital signs in last 24 hours:    Temp:  [97.8 F (36.6 C)-98.2 F (36.8 C)] 97.8 F (36.6 C) (06/29 0512) Pulse Rate:  [54-69] 54 (06/29 0512) Resp:  [18-20] 18 (06/29 0512) BP: (129-160)/(62-72) 152/62 mmHg (06/29 0512) SpO2:  [96 %-98 %] 97 % (06/29 0512) Last BM Date: 08/15/14 Filed Weights   08/07/14 1143 08/07/14 1529  Weight: 227 lb (102.967 kg) 229 lb 12.8 oz (104.237 kg)   General: looks better, stronger   Heart: RRR Chest: clear bil.  Abdomen: soft, mild diffuse tenderness, ND.  Active BS  Extremities: no CCE Neuro/Psych:  Oriented x 3.  In good spirits.  No gross deficits.   Intake/Output from previous day: 06/28 0701 - 06/29 0700 In: 1080 [P.O.:1080] Out: -   Intake/Output this shift: Total I/O In: 320 [P.O.:320] Out: -   Lab Results: No results for input(s): WBC, HGB, HCT, PLT in the last 72 hours. BMET  Recent Labs  08/14/14 0455 08/16/14 0554  NA 134* 135  K 4.4 4.1  CL 103 97*  CO2 26 26  GLUCOSE 135* 140*  BUN 15 13  CREATININE 0.96 0.86  CALCIUM 8.5* 8.6*    Studies/Results: No results found.   Scheduled Meds: . diphenoxylate-atropine  2 tablet Oral TID AC & HS  . feeding supplement (RESOURCE BREEZE)  1 Container Oral TID BM  . hydrocortisone-pramoxine  1 applicator Rectal QHS  . mesalamine  4.8 g Oral Q breakfast  . metoprolol tartrate  12.5 mg Oral BID  . metroNIDAZOLE  500 mg Oral 4 times per day  . multivitamin with minerals  1 tablet Oral Daily  . predniSONE  20 mg Oral BID  . rosuvastatin  40 mg Oral QHS  . saccharomyces boulardii  250 mg Oral BID   Continuous  Infusions:  PRN Meds:.acetaminophen **OR** acetaminophen, ondansetron **OR** ondansetron (ZOFRAN) IV, pramoxine-mineral oil-zinc, witch hazel-glycerin   ASSESMENT:   *  Flare UC, previously few sxs until  Rx with Cleocin  07/22/2014 .  [place] ruled out. Flex sig 6/22: severe active colitis, no cdiff on path or by 2 separated PCRs.  On Prednisone (after initial Solumedrol), po Metronidazol, cortenema, Lialda, florastor.   *  Hyperglycemia in setting of PO steroids.    PLAN   *  Not ready for discharge    [name]  08/16/2014, 10:28 AM Pager: [phone]

## 2014-08-16 NOTE — Progress Notes (Addendum)
Patient ID: [name], female   DOB: 1940/08/12, 74 y.o.   MRN: [id number]  [facility] PROGRESS NOTE  YULIETH CARRENDER JME:[id number] DOB: 1940/04/16 DOA: 08/07/2014 PCP: Kandice Hams, MD   Brief narrative:    74 y.o. female with known history of ulcerative colitis, CAD status post CABG, carotid artery disease status post CEA, recently started on clindamycin for toe infection 2 weeks ago and subsequently developed severe diarrhea and clindamycin was stopped. Pt was seen by GI doctor, C. Diff negative and pt started on Florastor. She presented to [facility] ED as her symptoms have not improved.   Hospital course complicated by persistent bloody diarrhea, better after transitioning to Flagyl IV but has required prolonger IV Solumedrol. Pt has been transitioned to Prednisone 6/27 and has had few bloody movements overnight 6/27.  Assessment/Plan:    Ulcerative colitis Flare - s/p flex sig 08/08/2014, notable for diffuse edema, concerning for UC with pseudomembranes  - started on oral vancomycin 6/21 and solumedrol per GI recommendations  - oral vancomycin has been discontinued 6/23 , started on Flagyl IV per GI team recommendations 6/24 and changed to PO 6/27 - pt transitioned to oral prednisone 20 mg PO QD on 6/27 - overall pt reports feeling better but still with several episodes of bloody diarrhea overnight - continue Probiotic, Mesalamine  -mgt per GI -resume IVF if diarrhea persists or worsens  Hypokalemia - supplemented, within normal limits 6/27, repeat BMP in AM  Acute renal insufficiency   - secondary to pre renal etiology from diarrhea - IVF have been provided and Cr is now WNL  - due to Leg edema, IVF stopped 6/25 and pt given one dose of Lasix 20 mg PO x 1 dose 6/26 and 6/27 -monitor  HTN - stable  Bradycardia - from Metoprolol, asymptomatic, lowered dose of metoprolol   HLD - continue statin   CAD/CABG Continue lopressor and statin  Obesity - Body mass index is  33.92 kg/(m^2).  DVT prophylaxis - SCD's  Code Status: Full.  Family Communication:  plan of care discussed with the patient Disposition Plan: Home when cleared by GI team   IV access:  Peripheral IV  Procedures and diagnostic studies:    Dg Foot Complete Right 07/22/2014   No acute osseous abnormalities.   Flex Sig 6/21 bu Dr. Carlean Purl  ENDOSCOPIC IMPRESSION: 1) diffuse edema, granular and friable mucosa rectum to 40 cm - looks like UC - biopsied 2) Pseudomembranes over inflamed but mostly NL mucosa 40-70 cm -biopsies taken  RECOMMENDATIONS: Start vancomycin and solumedrol  Medical Consultants:  GI  Other Consultants:  None  IAnti-Infectives:   Oral vancomycin 6/21 --> 6/23 Flagyl 6/24 --> on IV until 6/27 at which point pt was transitioned to oral Daiva Eves, MD  [facility] Pager [phone] If 7PM-7AM, please contact night-coverage [email or web address] Password [email or web address] 08/16/2014, 2:05 PM   LOS: 9 days   HPI/Subjective: 6-7 episodes of bloody diarrhea yesterday  Objective: Filed Vitals:   08/15/14 2101 08/16/14 0512 08/16/14 1044 08/16/14 1302  BP: 160/72 152/62 162/54 134/60  Pulse: 61 54 67 58  Temp: 98.2 F (36.8 C) 97.8 F (36.6 C)  98.6 F (37 C)  TempSrc: Oral Oral    Resp: 18 18  18   Height:      Weight:      SpO2: 96% 97%  98%    Intake/Output Summary (Last 24 hours) at 08/16/14 1405 Last data filed at 08/16/14 1221  Gross per 24 hour  Intake   1100 ml  Output    200 ml  Net    900 ml    Exam:   General:  AAOx3, no distress, pleasant  Cardiovascular: Regular rhythm, bradycardic, no rubs, no gallops  Respiratory: Clear to auscultation bilaterally, no wheezing, no crackles, no rhonchi  Abdomen: Soft, non tender, non distended, bowel sounds present, no guarding  Data Reviewed: Basic Metabolic Panel:  Recent Labs Lab 08/10/14 1500 08/11/14 0552 08/12/14 0550 08/14/14 0455 08/16/14 0554  NA 134* 135 132* 134* 135  K 4.7 4.8 4.0  4.4 4.1  CL 104 104 104 103 97*  CO2 22 18* 21* 26 26  GLUCOSE 100* 102* 129* 135* 140*  BUN 9 10 9 15 13   CREATININE 0.89 0.85 0.80 0.96 0.86  CALCIUM 8.3* 8.8* 7.9* 8.5* 8.6*   CBC:  Recent Labs Lab 08/12/14 0550  WBC 15.1*  HGB 12.2  HCT 36.8  MCV 91.5  PLT 220    Recent Results (from the past 240 hour(s))  Clostridium Difficile by PCR (not at [facility])     Status: None   Collection Time: 08/03/14  4:15 PM  Result Value Ref Range Status   C difficile by pcr Not Detected Not Detected Final     Scheduled Meds: . diphenoxylate-atropine  2 tablet Oral TID AC & HS  . feeding supplement (RESOURCE BREEZE)  1 Container Oral TID BM  . hydrocortisone-pramoxine  1 applicator Rectal QHS  . mesalamine  4.8 g Oral Q breakfast  . metoprolol tartrate  12.5 mg Oral BID  . metroNIDAZOLE  500 mg Oral 4 times per day  . multivitamin with minerals  1 tablet Oral Daily  . predniSONE  20 mg Oral BID  . rosuvastatin  40 mg Oral QHS  . saccharomyces boulardii  250 mg Oral BID   Continuous Infusions:

## 2014-08-16 NOTE — Progress Notes (Signed)
Utilization Review completed. Marilou Barnfield RN BSN CM 

## 2014-08-16 NOTE — Progress Notes (Signed)
Nutrition Follow-up  DOCUMENTATION CODES:  Obesity unspecified  INTERVENTION:   (Continue with regular diet)  NUTRITION DIAGNOSIS:  Altered GI function related to chronic illness as evidenced by meal completion < 25%, other (see comment) (ongoing diarrhea).  Progressing  GOAL:  Patient will meet greater than or equal to 90% of their needs  Progressing  MONITOR:  PO intake, Diet advancement, Labs, Weight trends, Skin, I & O's  REASON FOR ASSESSMENT:  Consult Assessment of nutrition requirement/status  ASSESSMENT: 74 y.o. female with past medical history of ulcerative colitis, CAD status post CABG, carotid artery disease status post CEA was started on clindamycin for toe infection 2 weeks ago subsequently developed severe diarrhea and clindamycin was stopped. Unfortunately still continues having loose stools almost every hour, intermittently has noticed blood in her stools. Due to progressive weakness and worsening diarrhea presented to the ER.  Pt sitting in chair at time of visit. She reports that her appetite has returned, but her primary concern is her ongoing diarrhea. She reports "everything runs right through me". Pt admits that she has been refusing her supplements, due to causing her bloating. RD will d/c.   She has been advanced to a regular diet; pt reveals that she is very careful about her meal selections in order to receive foods that agree with her. She reports that added salt, added sugar, and pork aggravate symptoms and tries to follow a general, healthy diet at home. Pt is very knowledgeable about diet principles.   Pt denies any further needs at this time, but expresses appreciation for visit.   Height:  Ht Readings from Last 1 Encounters:  08/07/14 5\' 9"  (1.753 m)    Weight:  Wt Readings from Last 1 Encounters:  08/07/14 229 lb 12.8 oz (104.237 kg)    Ideal Body Weight:  65.9 kg  Wt Readings from Last 10 Encounters:  08/07/14 229 lb 12.8 oz  (104.237 kg)  08/03/14 227 lb 4 oz (103.08 kg)  07/22/14 220 lb (99.791 kg)  03/28/14 234 lb 1.6 oz (106.187 kg)  03/02/14 233 lb (105.688 kg)  05/17/13 224 lb 6.4 oz (101.787 kg)  03/04/13 223 lb (101.152 kg)  02/25/13 218 lb 8 oz (99.111 kg)  02/24/13 222 lb (100.699 kg)  12/03/12 220 lb 12.8 oz (100.154 kg)    BMI:  Body mass index is 33.92 kg/(m^2).  Estimated Nutritional Needs:  Kcal:  1800-2000  Protein:  85-100 grams  Fluid:  1.8-2 L/day  Skin:  Reviewed, no issues  Diet Order:  Diet regular Room service appropriate?: Yes; Fluid consistency:: Thin  EDUCATION NEEDS:  No education needs identified at this time   Intake/Output Summary (Last 24 hours) at 08/16/14 1420 Last data filed at 08/16/14 1221  Gross per 24 hour  Intake   1100 ml  Output    200 ml  Net    900 ml    Last BM:  08/16/14  Daylan Boggess A. Jimmye Norman, RD, LDN, CDE Pager: [phone] After hours Pager: [phone]

## 2014-08-17 DIAGNOSIS — K529 Noninfective gastroenteritis and colitis, unspecified: Secondary | ICD-10-CM

## 2014-08-17 DIAGNOSIS — I10 Essential (primary) hypertension: Secondary | ICD-10-CM

## 2014-08-17 LAB — BASIC METABOLIC PANEL
Anion gap: 9 (ref 5–15)
BUN: 12 mg/dL (ref 6–20)
CO2: 29 mmol/L (ref 22–32)
Calcium: 7.9 mg/dL — ABNORMAL LOW (ref 8.9–10.3)
Chloride: 95 mmol/L — ABNORMAL LOW (ref 101–111)
Creatinine, Ser: 0.92 mg/dL (ref 0.44–1.00)
GFR calc Af Amer: >60 mL/min (ref >60)
GFR calc non Af Amer: 59 mL/min — ABNORMAL LOW (ref >60)
GLUCOSE: 157 mg/dL — AB (ref 65–99)
Potassium: 4.5 mmol/L (ref 3.5–5.1)
Sodium: 133 mmol/L — ABNORMAL LOW (ref 135–145)

## 2014-08-17 LAB — CBC
HEMATOCRIT: 37.6 % (ref 36.0–46.0)
HEMOGLOBIN: 12.4 g/dL (ref 12.0–15.0)
MCH: 30.6 pg (ref 26.0–34.0)
MCHC: 33 g/dL (ref 30.0–36.0)
MCV: 92.8 fL (ref 78.0–100.0)
Platelets: 230 10*3/uL (ref 150–400)
RBC: 4.05 MIL/uL (ref 3.87–5.11)
RDW: 13.4 % (ref 11.5–15.5)
WBC: 11 10*3/uL — AB (ref 4.0–10.5)

## 2014-08-17 NOTE — Progress Notes (Signed)
          Daily Rounding Note  08/17/2014, 10:19 AM  LOS: 10 days   SUBJECTIVE:       bloody stools persist, 6 or 7 yesterday.  1 overnight, abut 3 this AM.  Some cramping low abdominal discomfort around the BMs but resolves post BM.  Making herself eat, regular diet.    OBJECTIVE:         Vital signs in last 24 hours:    Temp:  [98.1 F (36.7 C)-98.6 F (37 C)] 98.1 F (36.7 C) (06/30 0503) Pulse Rate:  [58-75] 67 (06/30 0503) Resp:  [16-20] 20 (06/30 0503) BP: (118-162)/(54-85) 126/85 mmHg (06/30 0503) SpO2:  [96 %-99 %] 96 % (06/30 0503) Last BM Date: 08/16/14 Filed Weights   08/07/14 1143 08/07/14 1529  Weight: 227 lb (102.967 kg) 229 lb 12.8 oz (104.237 kg)   General: pleasant, comfortable.  Overall looks better but tired.    Heart: RRR Chest: clear bil. Some dyspnea with speaking Abdomen: soft, NT, active BS, ND  Extremities: no CCE Neuro/Psych:  Relaxed, fully alert and oriented.   Intake/Output from previous day: 06/29 0701 - 06/30 0700 In: 1120 [P.O.:1120] Out: 1250 [Urine:1250]  Intake/Output this shift: Total I/O In: 200 [P.O.:200] Out: 750 [Urine:750]  Lab Results:  Recent Labs  08/17/14 0353  WBC 11.0*  HGB 12.4  HCT 37.6  PLT 230   BMET  Recent Labs  08/16/14 0554 08/17/14 0353  NA 135 133*  K 4.1 4.5  CL 97* 95*  CO2 26 29  GLUCOSE 140* 157*  BUN 13 12  CREATININE 0.86 0.92  CALCIUM 8.6* 7.9*   LFT No results for input(s): PROT, ALBUMIN, AST, ALT, ALKPHOS, BILITOT, BILIDIR, IBILI in the last 72 hours. PT/INR No results for input(s): LABPROT, INR in the last 72 hours. Hepatitis Panel No results for input(s): HEPBSAG, HCVAB, HEPAIGM, HEPBIGM in the last 72 hours.  Studies/Results: No results found.   Scheduled Meds: . diphenoxylate-atropine  2 tablet Oral TID AC & HS  . hydrocortisone-pramoxine  1 applicator Rectal QHS  . mesalamine  4.8 g Oral Q breakfast  . metoprolol  tartrate  12.5 mg Oral BID  . metroNIDAZOLE  500 mg Oral 4 times per day  . multivitamin with minerals  1 tablet Oral Daily  . predniSONE  20 mg Oral BID  . rosuvastatin  40 mg Oral QHS  . saccharomyces boulardii  250 mg Oral BID   Continuous Infusions:  PRN Meds:.acetaminophen **OR** acetaminophen, ondansetron **OR** ondansetron (ZOFRAN) IV, pramoxine-mineral oil-zinc, witch hazel-glycerin   ASSESMENT:    * Flare UC, previously few sxs until Rx with Cleocin 07/22/2014 . [place] ruled out. Flex sig 6/22: severe active colitis, no cdiff on path or by 2 separated PCRs.  On Prednisone (after initial Solumedrol), po Metronidazole, scheduled Lomotil,  cortenema, Lialda, florastor.   * Hyperglycemia in setting of PO steroids.     PLAN   *  ? Add anti-TNF meds and/or Azathioprine?   Will obtain quantiferon TB test. Consider TPMT genotype testing, though difficult to do this in inpt setting.      Azucena Freed  08/17/2014, 10:19 AM Pager: [phone]

## 2014-08-17 NOTE — Progress Notes (Signed)
Patient ID: [name], female   DOB: [birth year], 74 y.o.   MRN: [id number]  [facility] PROGRESS NOTE  [name] OZD:[id number] DOB: 1940-05-07 DOA: 08/07/2014 PCP: Kandice Hams, MD   Brief narrative:    74 y.o. female with known history of ulcerative colitis, CAD status post CABG, carotid artery disease status post CEA, recently started on clindamycin for toe infection 2 weeks ago and subsequently developed severe diarrhea and clindamycin was stopped. Pt was seen by GI doctor, C. Diff negative and pt started on Florastor. She presented to [place] ED as her symptoms have not improved.   Hospital course complicated by persistent bloody diarrhea, better after transitioning to Flagyl IV but has required prolonger IV Solumedrol. Pt has been transitioned to Prednisone 6/27 and has had few bloody movements overnight 6/27.  Assessment/Plan:    Ulcerative colitis Flare-Protracted - s/p flex sig 08/08/2014, notable for diffuse edema, concerning for UC with pseudomembranes  - started on oral vancomycin 6/21 and solumedrol per GI recommendations  - oral vancomycin has been discontinued 6/23 , started on Flagyl IV per GI team recommendations 6/24 and changed to PO 6/27 - pt transitioned to oral prednisone 20 mg PO QD on 6/27 - overall pt reports feeling better but still with several episodes of bloody diarrhea overnight - continue Probiotic, Mesalamine, Proctofoam - mgt per GI - hold off on IVF at this time, Po intake adequate  Hypokalemia - supplemented, within normal limits 6/27, repeat BMP in AM  Acute renal insufficiency   - secondary to pre renal etiology from diarrhea - IVF have been provided and Cr is now WNL  - due to Leg edema, IVF stopped 6/25 and pt given one dose of Lasix 20 mg PO x 1 dose 6/26 and 6/27 -monitor  HTN - stable  Bradycardia - from Metoprolol, asymptomatic, lowered dose of metoprolol   HLD - continue statin   CAD/CABG Continue lopressor and  statin  Obesity - Body mass index is 33.92 kg/(m^2).  DVT prophylaxis - SCD's  Code Status: Full.  Family Communication:  plan of care discussed with the patient Disposition Plan: Home when cleared by GI team   IV access:  Peripheral IV  Procedures and diagnostic studies:    Dg Foot Complete Right 07/22/2014   No acute osseous abnormalities.   Flex Sig 6/21 bu Dr. Carlean Purl  ENDOSCOPIC IMPRESSION: 1) diffuse edema, granular and friable mucosa rectum to 40 cm - looks like UC - biopsied 2) Pseudomembranes over inflamed but mostly NL mucosa 40-70 cm -biopsies taken  RECOMMENDATIONS: Start vancomycin and solumedrol  Medical Consultants:  GI  Other Consultants:  None  IAnti-Infectives:   Oral vancomycin 6/21 --> 6/23 Flagyl 6/24 --> on IV until 6/27 at which point pt was transitioned to oral Daiva Eves, MD  [facility] Pager [phone] If 7PM-7AM, please contact night-coverage [email or web address] Password [email or web address] 08/17/2014, 2:32 PM   LOS: 10 days   HPI/Subjective: 6 episodes of bloody diarrhea yesterday, 2 this am, otherwise feels ok, eating fair  Objective: Filed Vitals:   08/16/14 1302 08/16/14 2135 08/17/14 0503 08/17/14 1326  BP: 134/60 118/60 126/85 124/62  Pulse: 58 75 67 63  Temp: 98.6 F (37 C) 98.6 F (37 C) 98.1 F (36.7 C) 97.9 F (36.6 C)  TempSrc:  Oral Oral   Resp: 18 16 20 20   Height:      Weight:      SpO2: 98% 99% 96% 95%    Intake/Output Summary (Last 24  hours) at 08/17/14 1432 Last data filed at 08/17/14 1327  Gross per 24 hour  Intake   1180 ml  Output   2100 ml  Net   -920 ml    Exam:   General:  AAOx3, no distress, pleasant  Cardiovascular: Regular rhythm, bradycardic, no rubs, no gallops  Respiratory: Clear to auscultation bilaterally, no wheezing, no crackles, no rhonchi  Abdomen: Soft, non tender, non distended, bowel sounds present, no guarding  Ext: trace edema  Data Reviewed: Basic Metabolic Panel:  Recent  Labs Lab 08/11/14 0552 08/12/14 0550 08/14/14 0455 08/16/14 0554 08/17/14 0353  NA 135 132* 134* 135 133*  K 4.8 4.0 4.4 4.1 4.5  CL 104 104 103 97* 95*  CO2 18* 21* 26 26 29   GLUCOSE 102* 129* 135* 140* 157*  BUN 10 9 15 13 12   CREATININE 0.85 0.80 0.96 0.86 0.92  CALCIUM 8.8* 7.9* 8.5* 8.6* 7.9*   CBC:  Recent Labs Lab 08/12/14 0550 08/17/14 0353  WBC 15.1* 11.0*  HGB 12.2 12.4  HCT 36.8 37.6  MCV 91.5 92.8  PLT 220 230    Recent Results (from the past 240 hour(s))  Clostridium Difficile by PCR (not at [facility])     Status: None   Collection Time: 08/03/14  4:15 PM  Result Value Ref Range Status   C difficile by pcr Not Detected Not Detected Final     Scheduled Meds: . diphenoxylate-atropine  2 tablet Oral TID AC & HS  . hydrocortisone-pramoxine  1 applicator Rectal QHS  . mesalamine  4.8 g Oral Q breakfast  . metoprolol tartrate  12.5 mg Oral BID  . metroNIDAZOLE  500 mg Oral 4 times per day  . multivitamin with minerals  1 tablet Oral Daily  . predniSONE  20 mg Oral BID  . rosuvastatin  40 mg Oral QHS  . saccharomyces boulardii  250 mg Oral BID   Continuous Infusions:

## 2014-08-18 DIAGNOSIS — K611 Rectal abscess: Secondary | ICD-10-CM

## 2014-08-18 DIAGNOSIS — K51918 Ulcerative colitis, unspecified with other complication: Secondary | ICD-10-CM

## 2014-08-18 HISTORY — DX: Rectal abscess: K61.1

## 2014-08-18 LAB — CBC
HEMATOCRIT: 38.7 % (ref 36.0–46.0)
Hemoglobin: 12.8 g/dL (ref 12.0–15.0)
MCH: 30.7 pg (ref 26.0–34.0)
MCHC: 33.1 g/dL (ref 30.0–36.0)
MCV: 92.8 fL (ref 78.0–100.0)
PLATELETS: 251 10*3/uL (ref 150–400)
RBC: 4.17 MIL/uL (ref 3.87–5.11)
RDW: 13.6 % (ref 11.5–15.5)
WBC: 11.3 10*3/uL — ABNORMAL HIGH (ref 4.0–10.5)

## 2014-08-18 LAB — BASIC METABOLIC PANEL
Anion gap: 8 (ref 5–15)
BUN: 11 mg/dL (ref 6–20)
CALCIUM: 8 mg/dL — AB (ref 8.9–10.3)
CO2: 32 mmol/L (ref 22–32)
Chloride: 93 mmol/L — ABNORMAL LOW (ref 101–111)
Creatinine, Ser: 0.87 mg/dL (ref 0.44–1.00)
GFR calc Af Amer: >60 mL/min (ref >60)
Glucose, Bld: 163 mg/dL — ABNORMAL HIGH (ref 65–99)
Potassium: 4.9 mmol/L (ref 3.5–5.1)
SODIUM: 133 mmol/L — AB (ref 135–145)

## 2014-08-18 NOTE — Progress Notes (Signed)
          Daily Rounding Note  08/18/2014, 11:49 AM  LOS: 11 days   SUBJECTIVE:       4 stools yesterday, not quite as bloody or as large.  Non-bloody stool this AM, but later had bloody BM.  No abdominal pain.  Less fecal urgency.  Eating 85 to 100% of her regular diet  OBJECTIVE:         Vital signs in last 24 hours:    Temp:  [97.9 F (36.6 C)-98.4 F (36.9 C)] 98.3 F (36.8 C) (07/01 0628) Pulse Rate:  [63-69] 69 (07/01 0628) Resp:  [18-20] 18 (07/01 0628) BP: (124-157)/(53-66) 146/53 mmHg (07/01 0628) SpO2:  [95 %-100 %] 97 % (07/01 0628) Last BM Date: 08/18/14 Filed Weights   08/07/14 1143 08/07/14 1529  Weight: 227 lb (102.967 kg) 229 lb 12.8 oz (104.237 kg)   General: pleasant, NAD.  Not toxic.   Heart: RRR.  No mrg Chest: clear bil.  No cough or dyspnea Abdomen: soft, active BS, minimal non-focal tenderness  Extremities: no CCE.  Neuro/Psych:  Pleasant and engaged as always. No gross deficits or confusion.   Intake/Output from previous day: 06/30 0701 - 07/01 0700 In: 440 [P.O.:440] Out: 1850 [Urine:1850]  Intake/Output this shift: Total I/O In: 360 [P.O.:360] Out: -   Lab Results:  Recent Labs  08/17/14 0353 08/18/14 0414  WBC 11.0* 11.3*  HGB 12.4 12.8  HCT 37.6 38.7  PLT 230 251   BMET  Recent Labs  08/16/14 0554 08/17/14 0353 08/18/14 0414  NA 135 133* 133*  K 4.1 4.5 4.9  CL 97* 95* 93*  CO2 26 29 32  GLUCOSE 140* 157* 163*  BUN 13 12 11   CREATININE 0.86 0.92 0.87  CALCIUM 8.6* 7.9* 8.0*    Studies/Results: No results found.   Scheduled Meds: . diphenoxylate-atropine  2 tablet Oral TID AC & HS  . hydrocortisone-pramoxine  1 applicator Rectal QHS  . mesalamine  4.8 g Oral Q breakfast  . metoprolol tartrate  12.5 mg Oral BID  . metroNIDAZOLE  500 mg Oral 4 times per day  . multivitamin with minerals  1 tablet Oral Daily  . predniSONE  20 mg Oral BID  . rosuvastatin  40 mg Oral  QHS  . saccharomyces boulardii  250 mg Oral BID   Continuous Infusions:  PRN Meds:.acetaminophen **OR** acetaminophen, ondansetron **OR** ondansetron (ZOFRAN) IV, pramoxine-mineral oil-zinc, witch hazel-glycerin   ASSESMENT:   * Flare UC, previously few sxs until Rx with Cleocin 07/22/2014. Flex sig 6/22: severe active colitis, no cdiff on path or by 2 separated PCRs.  On Prednisone (after initial Solumedrol), po Metronidazole, scheduled Lomotil, cortenema, Lialda, florastor.  Slowly improving but not yet ready for discharge.   * Hyperglycemia in setting of PO steroids.    PLAN   *  Stay with current plan. Hopefully will be able to go home over weekend.  Has 7/15 appt set up with PA at GI office.   *  Quantiferon TB test is pending, obtained in case pt need Anti TNF meds in future.     Azucena Freed  08/18/2014, 11:49 AM Pager: [phone]

## 2014-08-18 NOTE — Progress Notes (Addendum)
Patient ID: [name], female   DOB: Nov 04, 1940, 74 y.o.   MRN: [id number]  [facility] PROGRESS NOTE  [name] EHO:[id number] DOB: [birth year] DOA: 08/07/2014 PCP: Kandice Hams, MD   Brief narrative:    74 y.o. female with known history of ulcerative colitis, CAD status post CABG, carotid artery disease status post CEA, recently started on clindamycin for toe infection 2 weeks ago and subsequently developed severe diarrhea and clindamycin was stopped. Pt was seen by GI doctor, C. Diff negative and pt started on Florastor. She presented to [facility] ED as her symptoms have not improved.   Hospital course complicated by persistent bloody diarrhea, better after transitioning to Flagyl IV but has required prolonger IV Solumedrol. Pt has been transitioned to Prednisone 6/27 and has had few bloody movements overnight 6/27.  Assessment/Plan:    Ulcerative colitis Flare-Protracted - s/p flex sig 08/08/2014, notable for diffuse edema, concerning for UC with pseudomembranes  - started on oral vancomycin 6/21 and solumedrol per GI recommendations  - oral vancomycin has been discontinued 6/23 , started on Flagyl IV per GI team recommendations 6/24 and changed to PO 6/27 - pt transitioned to oral prednisone 20 mg PO QD on 6/27 - continue Probiotic, Mesalamine, Proctofoam - mgt per GI, may be finally starting to turn the corner, 4-5 episodes of bloody diarrhea yesterday - DC home when cleared by GI  Hypokalemia - supplemented, within normal limits 6/27, repeat BMP in AM  Acute renal insufficiency   - secondary to pre renal etiology from diarrhea - IVF have been provided and Cr is now WNL  - due to Leg edema, IVF stopped 6/25 and pt given one dose of Lasix 20 mg PO x 1 dose 6/26 and 6/27 -monitor  HTN - stable  Bradycardia - from Metoprolol, asymptomatic, lowered dose of metoprolol   HLD - continue statin   CAD/CABG Continue lopressor and statin  Obesity - Body mass index is  33.92 kg/(m^2).  DVT prophylaxis - SCD's  Code Status: Full.  Family Communication:  plan of care discussed with the patient Disposition Plan: Home when cleared by GI team   IV access:  Peripheral IV  Procedures and diagnostic studies:    Dg Foot Complete Right 07/22/2014   No acute osseous abnormalities.   Flex Sig 6/21 bu Dr. Carlean Purl  ENDOSCOPIC IMPRESSION: 1) diffuse edema, granular and friable mucosa rectum to 40 cm - looks like UC - biopsied 2) Pseudomembranes over inflamed but mostly NL mucosa 40-70 cm -biopsies taken  RECOMMENDATIONS: Start vancomycin and solumedrol  Medical Consultants:  GI  Other Consultants:  None  IAnti-Infectives:   Oral vancomycin 6/21 --> 6/23 Flagyl 6/24 --> on IV until 6/27 at which point pt was transitioned to oral [name] Eves, MD  [facility] Pager [phone] If 7PM-7AM, please contact night-coverage [email or web address] Password [email or web address] 08/18/2014, 10:35 AM   LOS: 11 days   HPI/Subjective: 4 episodes of bloody diarrhea yesterday,  feels ok, eating fair  Objective: Filed Vitals:   08/17/14 1326 08/17/14 2120 08/17/14 2154 08/18/14 0628  BP: 124/62 157/66 154/63 146/53  Pulse: 63 67 64 69  Temp: 97.9 F (36.6 C) 98.4 F (36.9 C) 98.3 F (36.8 C) 98.3 F (36.8 C)  TempSrc:  Oral Oral Oral  Resp: 20  18 18   Height:      Weight:      SpO2: 95% 97% 100% 97%    Intake/Output Summary (Last 24 hours) at 08/18/14 1035 Last data filed at  08/18/14 1026  Gross per 24 hour  Intake    600 ml  Output   1000 ml  Net   -400 ml    Exam:   General:  AAOx3, no distress, pleasant  Cardiovascular: Regular rhythm, bradycardic, no rubs, no gallops  Respiratory: Clear to auscultation bilaterally, no wheezing, no crackles, no rhonchi  Abdomen: Soft, non tender, non distended, bowel sounds present, no guarding  Ext: trace edema  Data Reviewed: Basic Metabolic Panel:  Recent Labs Lab 08/12/14 0550 08/14/14 0455 08/16/14 0554  08/17/14 0353 08/18/14 0414  NA 132* 134* 135 133* 133*  K 4.0 4.4 4.1 4.5 4.9  CL 104 103 97* 95* 93*  CO2 21* 26 26 29  32  GLUCOSE 129* 135* 140* 157* 163*  BUN 9 15 13 12 11   CREATININE 0.80 0.96 0.86 0.92 0.87  CALCIUM 7.9* 8.5* 8.6* 7.9* 8.0*   CBC:  Recent Labs Lab 08/12/14 0550 08/17/14 0353 08/18/14 0414  WBC 15.1* 11.0* 11.3*  HGB 12.2 12.4 12.8  HCT 36.8 37.6 38.7  MCV 91.5 92.8 92.8  PLT 220 230 251    Recent Results (from the past 240 hour(s))  Clostridium Difficile by PCR (not at [facility])     Status: None   Collection Time: 08/03/14  4:15 PM  Result Value Ref Range Status   C difficile by pcr Not Detected Not Detected Final     Scheduled Meds: . diphenoxylate-atropine  2 tablet Oral TID AC & HS  . hydrocortisone-pramoxine  1 applicator Rectal QHS  . mesalamine  4.8 g Oral Q breakfast  . metoprolol tartrate  12.5 mg Oral BID  . metroNIDAZOLE  500 mg Oral 4 times per day  . multivitamin with minerals  1 tablet Oral Daily  . predniSONE  20 mg Oral BID  . rosuvastatin  40 mg Oral QHS  . saccharomyces boulardii  250 mg Oral BID   Continuous Infusions:

## 2014-08-19 NOTE — Progress Notes (Signed)
Patient ID: [name], female   DOB: 28-Jul-1940, 74 y.o.   MRN: [id number]  [facility] PROGRESS NOTE  KERIE BADGER [id number]:[id number] DOB: May 11, 1940 DOA: 08/07/2014 PCP: Kandice Hams, MD   Brief narrative:    74 y.o. female with known history of ulcerative colitis, CAD status post CABG, carotid artery disease status post CEA, recently started on clindamycin for toe infection 2 weeks ago and subsequently developed severe diarrhea and clindamycin was stopped. Pt was seen by GI doctor, C. Diff negative and pt started on Florastor. She presented to [facility] ED as her symptoms have not improved.   Hospital course complicated by persistent bloody diarrhea, better after transitioning to Flagyl IV but has required prolonger IV Solumedrol. Pt has been transitioned to Prednisone 6/27 and has had few bloody movements overnight 6/27.  Assessment/Plan:    Ulcerative colitis Flare-Protracted - s/p flex sig 08/08/2014, notable for diffuse edema, concerning for UC with pseudomembranes  - started on oral vancomycin 6/21 and solumedrol per GI recommendations  - oral vancomycin has been discontinued 6/23 , started on Flagyl IV per GI team recommendations 6/24 and changed to PO 6/27 - pt transitioned to oral prednisone 20 mg PO QD on 6/27 - continue Probiotic, Mesalamine, Proctofoam - mgt per GI, some progress albeit slowly, 3 episodes of bloody diarrhea yesterday - DC home when cleared by GI  Hypokalemia - supplemented, within normal limits 6/27, repeat BMP in AM  Acute renal insufficiency   - secondary to pre renal etiology from diarrhea - resolved with hydration - due to Leg edema, IVF stopped 6/25 and pt given one dose of Lasix 20 mg PO x 1 dose 6/26 and 6/27 -monitor  HTN - stable  Bradycardia - from Metoprolol, asymptomatic, lowered dose of metoprolol   HLD - continue statin   CAD/CABG Continue lopressor and statin  Obesity - Body mass index is 33.92 kg/(m^2).  DVT prophylaxis -  SCD's  Code Status: Full.  Family Communication:  plan of care discussed with the patient Disposition Plan: Home when cleared by GI team   IV access:  Peripheral IV  Procedures and diagnostic studies:    Dg Foot Complete Right 07/22/2014   No acute osseous abnormalities.   Flex Sig 6/21 bu Dr. Carlean Purl  ENDOSCOPIC IMPRESSION: 1) diffuse edema, granular and friable mucosa rectum to 40 cm - looks like UC - biopsied 2) Pseudomembranes over inflamed but mostly NL mucosa 40-70 cm -biopsies taken  RECOMMENDATIONS: Start vancomycin and solumedrol  Medical Consultants:  GI  Other Consultants:  None  IAnti-Infectives:   Oral vancomycin 6/21 --> 6/23 Flagyl 6/24 --> on IV until 6/27 at which point pt was transitioned to oral [name] Eves, MD  [facility] Pager [phone] If 7PM-7AM, please contact night-coverage [email or web address] Password [email or web address] 08/19/2014, 11:12 AM   LOS: 12 days   HPI/Subjective: 3 episodes of bloody diarrhea yesterday,  feels ok, eating fair  Objective: Filed Vitals:   08/18/14 0628 08/18/14 1319 08/18/14 2122 08/19/14 0556  BP: 146/53 146/64 156/73 142/51  Pulse: 69 59 84 63  Temp: 98.3 F (36.8 C) 97.6 F (36.4 C) 97.4 F (36.3 C) 98.2 F (36.8 C)  TempSrc: Oral Oral Oral Oral  Resp: 18  18   Height:      Weight:      SpO2: 97% 93% 92% 94%    Intake/Output Summary (Last 24 hours) at 08/19/14 1112 Last data filed at 08/19/14 0601  Gross per 24 hour  Intake  780 ml  Output    900 ml  Net   -120 ml    Exam:   General:  AAOx3, no distress, pleasant  Cardiovascular: Regular rhythm, bradycardic, no rubs, no gallops  Respiratory: Clear to auscultation bilaterally, no wheezing, no crackles, no rhonchi  Abdomen: Soft, non tender, non distended, bowel sounds present, no guarding  Ext: trace edema  Data Reviewed: Basic Metabolic Panel:  Recent Labs Lab 08/14/14 0455 08/16/14 0554 08/17/14 0353 08/18/14 0414  NA 134* 135 133* 133*   K 4.4 4.1 4.5 4.9  CL 103 97* 95* 93*  CO2 26 26 29  32  GLUCOSE 135* 140* 157* 163*  BUN 15 13 12 11   CREATININE 0.96 0.86 0.92 0.87  CALCIUM 8.5* 8.6* 7.9* 8.0*   CBC:  Recent Labs Lab 08/17/14 0353 08/18/14 0414  WBC 11.0* 11.3*  HGB 12.4 12.8  HCT 37.6 38.7  MCV 92.8 92.8  PLT 230 251    Recent Results (from the past 240 hour(s))  Clostridium Difficile by PCR (not at [facility])     Status: None   Collection Time: 08/03/14  4:15 PM  Result Value Ref Range Status   C difficile by pcr Not Detected Not Detected Final     Scheduled Meds: . diphenoxylate-atropine  2 tablet Oral TID AC & HS  . hydrocortisone-pramoxine  1 applicator Rectal QHS  . mesalamine  4.8 g Oral Q breakfast  . metoprolol tartrate  12.5 mg Oral BID  . metroNIDAZOLE  500 mg Oral 4 times per day  . multivitamin with minerals  1 tablet Oral Daily  . predniSONE  20 mg Oral BID  . rosuvastatin  40 mg Oral QHS  . saccharomyces boulardii  250 mg Oral BID   Continuous Infusions:

## 2014-08-19 NOTE — Progress Notes (Signed)
Subjective: She reports a couple of bloody bowel movements.  Objective: Vital signs in last 24 hours: Temp:  [97.4 F (36.3 C)-98.2 F (36.8 C)] 98.2 F (36.8 C) (07/02 0556) Pulse Rate:  [59-84] 63 (07/02 0556) Resp:  [18] 18 (07/01 2122) BP: (142-156)/(51-73) 142/51 mmHg (07/02 0556) SpO2:  [92 %-94 %] 94 % (07/02 0556) Last BM Date: 08/18/14  Intake/Output from previous day: 07/01 0701 - 07/02 0700 In: 1140 [P.O.:1140] Out: 900 [Urine:900] Intake/Output this shift:    General appearance: alert and no distress GI: soft, non-tender; bowel sounds normal; no masses,  no organomegaly  Lab Results:  Recent Labs  08/17/14 0353 08/18/14 0414  WBC 11.0* 11.3*  HGB 12.4 12.8  HCT 37.6 38.7  PLT 230 251   BMET  Recent Labs  08/17/14 0353 08/18/14 0414  NA 133* 133*  K 4.5 4.9  CL 95* 93*  CO2 29 32  GLUCOSE 157* 163*  BUN 12 11  CREATININE 0.92 0.87  CALCIUM 7.9* 8.0*   LFT No results for input(s): PROT, ALBUMIN, AST, ALT, ALKPHOS, BILITOT, BILIDIR, IBILI in the last 72 hours. PT/INR No results for input(s): LABPROT, INR in the last 72 hours. Hepatitis Panel No results for input(s): HEPBSAG, HCVAB, HEPAIGM, HEPBIGM in the last 72 hours. C-Diff No results for input(s): CDIFFTOX in the last 72 hours. Fecal Lactopherrin No results for input(s): FECLLACTOFRN in the last 72 hours.  Studies/Results: No results found.  Medications:  Scheduled: . diphenoxylate-atropine  2 tablet Oral TID AC & HS  . hydrocortisone-pramoxine  1 applicator Rectal QHS  . mesalamine  4.8 g Oral Q breakfast  . metoprolol tartrate  12.5 mg Oral BID  . metroNIDAZOLE  500 mg Oral 4 times per day  . multivitamin with minerals  1 tablet Oral Daily  . predniSONE  20 mg Oral BID  . rosuvastatin  40 mg Oral QHS  . saccharomyces boulardii  250 mg Oral BID   Continuous:   Assessment/Plan: 1) Left sided Ulcerative Colitis. 2) Bloody diarrhea.    She is on 5-ASA and steroids.  No  significant improvement at this time and there does not seem to be any progress.  Plan: 1) Continue with the current regimen for the time being.  LOS: 12 days   Wyonia Fontanella D 08/19/2014, 8:35 AM

## 2014-08-19 NOTE — Plan of Care (Signed)
Problem: Food- and Nutrition-Related Knowledge Deficit (NB-1.1) Goal: Nutrition education Formal process to instruct or train a patient/client in a skill or to impart knowledge to help patients/clients voluntarily manage or modify food choices and eating behavior to maintain or improve health. Outcome: Completed/Met Date Met:  08/19/14 Nutrition Education Note  RD consulted for nutrition education regarding a tolerable diet for Ulcerative Colitis.   Gave pt handout from Nutrition Care Manual titled "Inflammatory Bowel Disease (IBD) and Crohn's Disease Nutrition Therapy".   Pt is reportedly struggling to find any foods she is able to eat. She reports significant gas, abdominal pain and diarrhea.   Unfortunately, I explained that the diet for UC is usually individual specific. There is no one diet that each person is able to tolerate.  We did go over some dietary tips to manage symptoms. We talked about limiting gas forming foods such as beans, spinach, lactose, etc.   Also advised watching spicy foods such as onions or peppers.   When suffering from diarrhea, advised to stay away from very sugary foods/drinks as these can make diarrhea worse.   When suffering from abdominal pains, limiting fiber may be in her best interest. Told to always watch insoluble fiber: whole grains, nuts, seeds, peels and to remove these before consuming the food. Also avoiding foods with casings: hotdogs/sausage is a good idea.  Cooking meats/vegetables very well should make them more tolerable. Advised sticking to white grains, pastas, breads.   Discussed the beneficial impact a soluble fiber supplement could have  Pt reports already doing much of what we discussed, but was thankful for the discussion regardless.   Current diet order is Regular, patient is consuming approximately 80% of meals at this time. Labs and medications reviewed. No further nutrition interventions warranted at this time. RD contact  information provided. If additional nutrition issues arise, please re-consult RD.  Burtis Junes RD, LDN Nutrition Pager: [phone] 08/19/2014 11:29 AM

## 2014-08-20 LAB — BASIC METABOLIC PANEL
ANION GAP: 8 (ref 5–15)
BUN: 8 mg/dL (ref 6–20)
CALCIUM: 8.2 mg/dL — AB (ref 8.9–10.3)
CO2: 32 mmol/L (ref 22–32)
Chloride: 91 mmol/L — ABNORMAL LOW (ref 101–111)
Creatinine, Ser: 0.84 mg/dL (ref 0.44–1.00)
GFR calc non Af Amer: >60 mL/min (ref >60)
Glucose, Bld: 144 mg/dL — ABNORMAL HIGH (ref 65–99)
Potassium: 4.9 mmol/L (ref 3.5–5.1)
Sodium: 131 mmol/L — ABNORMAL LOW (ref 135–145)

## 2014-08-20 LAB — CBC
HEMATOCRIT: 38.3 % (ref 36.0–46.0)
HEMOGLOBIN: 12.6 g/dL (ref 12.0–15.0)
MCH: 30.4 pg (ref 26.0–34.0)
MCHC: 32.9 g/dL (ref 30.0–36.0)
MCV: 92.5 fL (ref 78.0–100.0)
PLATELETS: 270 10*3/uL (ref 150–400)
RBC: 4.14 MIL/uL (ref 3.87–5.11)
RDW: 13.7 % (ref 11.5–15.5)
WBC: 9.5 10*3/uL (ref 4.0–10.5)

## 2014-08-20 NOTE — Plan of Care (Signed)
Problem: Discharge Progression Outcomes Goal: Complications resolved/controlled Outcome: Not Met (add Reason) Pt still has blood in her stool

## 2014-08-20 NOTE — Progress Notes (Signed)
Subjective: Four bloody bowel movements over the past 24 hours.  Objective: Vital signs in last 24 hours: Temp:  [98.6 F (37 C)-99.3 F (37.4 C)] 99.3 F (37.4 C) (07/03 0518) Pulse Rate:  [46-86] 86 (07/03 0518) Resp:  [15-18] 17 (07/03 0518) BP: (121-139)/(57-65) 121/60 mmHg (07/03 0518) SpO2:  [96 %-100 %] 96 % (07/03 0518) Last BM Date: 08/19/14  Intake/Output from previous day: 07/02 0701 - 07/03 0700 In: 440 [P.O.:440] Out: -  Intake/Output this shift:    General appearance: alert and no distress GI: soft, non-tender; bowel sounds normal; no masses,  no organomegaly  Lab Results:  Recent Labs  08/18/14 0414 08/20/14 0507  WBC 11.3* 9.5  HGB 12.8 12.6  HCT 38.7 38.3  PLT 251 270   BMET  Recent Labs  08/18/14 0414 08/20/14 0507  NA 133* 131*  K 4.9 4.9  CL 93* 91*  CO2 32 32  GLUCOSE 163* 144*  BUN 11 8  CREATININE 0.87 0.84  CALCIUM 8.0* 8.2*   LFT No results for input(s): PROT, ALBUMIN, AST, ALT, ALKPHOS, BILITOT, BILIDIR, IBILI in the last 72 hours. PT/INR No results for input(s): LABPROT, INR in the last 72 hours. Hepatitis Panel No results for input(s): HEPBSAG, HCVAB, HEPAIGM, HEPBIGM in the last 72 hours. C-Diff No results for input(s): CDIFFTOX in the last 72 hours. Fecal Lactopherrin No results for input(s): FECLLACTOFRN in the last 72 hours.  Studies/Results: No results found.  Medications:  Scheduled: . diphenoxylate-atropine  2 tablet Oral TID AC & HS  . hydrocortisone-pramoxine  1 applicator Rectal QHS  . mesalamine  4.8 g Oral Q breakfast  . metoprolol tartrate  12.5 mg Oral BID  . metroNIDAZOLE  500 mg Oral 4 times per day  . multivitamin with minerals  1 tablet Oral Daily  . predniSONE  20 mg Oral BID  . rosuvastatin  40 mg Oral QHS  . saccharomyces boulardii  250 mg Oral BID   Continuous:   Assessment/Plan: 1) Left sided UC.   2) Bloody diarrhea.   Again, she does not appear to be making any significant progress,  but she is also not declining in her clinical status.  She is at a standstill.  AZA can be tried, but a TPMT needs to be ordered.  This test is very difficult to obtain inpatient and official clearance has to be obtained.  It may be worthwhile for her to be discharged home in the near future to obtain this blood work to start her on azathioprine.  She is stable overall.  Plan: 1) Maintain current regimen.   LOS: 13 days   Charlisa Cham D 08/20/2014, 8:25 AM

## 2014-08-20 NOTE — Progress Notes (Signed)
Patient ID: [name], female   DOB: 1940-02-25, 74 y.o.   MRN: [id number]  [facility] PROGRESS NOTE  [name] EGB:[id number] DOB: [birth year] DOA: 08/07/2014 PCP: Kandice Hams, MD   Brief narrative:    74 y.o. female with known history of ulcerative colitis, CAD status post CABG, carotid artery disease status post CEA, recently started on clindamycin for toe infection 2 weeks ago and subsequently developed severe diarrhea and clindamycin was stopped. Pt was seen by GI doctor, C. Diff negative and pt started on Florastor. She presented to [facility] ED as her symptoms have not improved.   Hospital course complicated by persistent bloody diarrhea, better after transitioning to Flagyl IV but has required prolonger IV Solumedrol. Pt has been transitioned to Prednisone 6/27 and has had few bloody movements overnight 6/27.  Assessment/Plan:    Ulcerative colitis Flare-Protracted - s/p flex sig 08/08/2014, notable for diffuse edema, concerning for UC with pseudomembranes  - started on oral vancomycin 6/21 and solumedrol per GI recommendations  - oral vancomycin has been discontinued 6/23 , started on Flagyl IV per GI team recommendations 6/24 and changed to PO 6/27, 7day course to be completed today - pt transitioned to oral prednisone 20 mg PO QD on 6/27 - continue Probiotic, Mesalamine, Proctofoam - mgt per GI, some progress albeit slowly, 4 episodes of bloody diarrhea yesterday - DC home when cleared by GI, ? Home tomorrow if stable -considering AZA as outpatient  Hypokalemia - supplemented, within normal limits 6/27, repeat BMP in AM  Acute renal insufficiency   - secondary to pre renal etiology from diarrhea - resolved with hydration - due to Leg edema, IVF stopped 6/25 and pt given one dose of Lasix 20 mg PO x 1 dose 6/26 and 6/27 -monitor  HTN - stable  Bradycardia - from Metoprolol, asymptomatic, lowered dose of metoprolol   HLD - continue statin   CAD/CABG Continue  lopressor and statin  Obesity - Body mass index is 33.92 kg/(m^2).  DVT prophylaxis - SCD's  Code Status: Full.  Family Communication:  plan of care discussed with the patient Disposition Plan: Home when cleared by GI team, likley tomorrow if stable  IV access:  Peripheral IV  Procedures and diagnostic studies:    Dg Foot Complete Right 07/22/2014   No acute osseous abnormalities.   Flex Sig 6/21 bu Dr. Carlean Purl  ENDOSCOPIC IMPRESSION: 1) diffuse edema, granular and friable mucosa rectum to 40 cm - looks like UC - biopsied 2) Pseudomembranes over inflamed but mostly NL mucosa 40-70 cm -biopsies taken  RECOMMENDATIONS: Start vancomycin and solumedrol  Medical Consultants:  GI  Other Consultants:  None  IAnti-Infectives:   Oral vancomycin 6/21 --> 6/23 Flagyl 6/24 --> on IV until 6/27 at which point pt was transitioned to oral Daiva Eves, MD  [facility] Pager [phone] If 7PM-7AM, please contact night-coverage [email or web address] Password [email or web address] 08/20/2014, 12:52 PM   LOS: 13 days   HPI/Subjective: 4 episodes of bloody diarrhea yesterday,  feels ok, eating fair, upset this am-worried abt going home  Objective: Filed Vitals:   08/19/14 0556 08/19/14 1359 08/19/14 2130 08/20/14 0518  BP: 142/51 139/65 137/57 121/60  Pulse: 63 46 70 86  Temp: 98.2 F (36.8 C) 98.8 F (37.1 C) 98.6 F (37 C) 99.3 F (37.4 C)  TempSrc: Oral Oral Oral Oral  Resp:  18 15 17   Height:      Weight:      SpO2: 94% 100% 100% 96%  Intake/Output Summary (Last 24 hours) at 08/20/14 1252 Last data filed at 08/20/14 0900  Gross per 24 hour  Intake    680 ml  Output      0 ml  Net    680 ml    Exam:   General:  AAOx3, no distress, pleasant  Cardiovascular: Regular rhythm, bradycardic, no rubs, no gallops  Respiratory: Clear to auscultation bilaterally, no wheezing, no crackles, no rhonchi  Abdomen: Soft, non tender, non distended, bowel sounds present, no guarding  Ext:  trace edema  Data Reviewed: Basic Metabolic Panel:  Recent Labs Lab 08/14/14 0455 08/16/14 0554 08/17/14 0353 08/18/14 0414 08/20/14 0507  NA 134* 135 133* 133* 131*  K 4.4 4.1 4.5 4.9 4.9  CL 103 97* 95* 93* 91*  CO2 26 26 29  32 32  GLUCOSE 135* 140* 157* 163* 144*  BUN 15 13 12 11 8   CREATININE 0.96 0.86 0.92 0.87 0.84  CALCIUM 8.5* 8.6* 7.9* 8.0* 8.2*   CBC:  Recent Labs Lab 08/17/14 0353 08/18/14 0414 08/20/14 0507  WBC 11.0* 11.3* 9.5  HGB 12.4 12.8 12.6  HCT 37.6 38.7 38.3  MCV 92.8 92.8 92.5  PLT 230 251 270    Recent Results (from the past 240 hour(s))  Clostridium Difficile by PCR (not at [facility])     Status: None   Collection Time: 08/03/14  4:15 PM  Result Value Ref Range Status   C difficile by pcr Not Detected Not Detected Final     Scheduled Meds: . diphenoxylate-atropine  2 tablet Oral TID AC & HS  . hydrocortisone-pramoxine  1 applicator Rectal QHS  . mesalamine  4.8 g Oral Q breakfast  . metoprolol tartrate  12.5 mg Oral BID  . metroNIDAZOLE  500 mg Oral 4 times per day  . multivitamin with minerals  1 tablet Oral Daily  . predniSONE  20 mg Oral BID  . rosuvastatin  40 mg Oral QHS  . saccharomyces boulardii  250 mg Oral BID   Continuous Infusions:

## 2014-08-21 LAB — CBC
HEMATOCRIT: 35.7 % — AB (ref 36.0–46.0)
HEMOGLOBIN: 11.6 g/dL — AB (ref 12.0–15.0)
MCH: 30.4 pg (ref 26.0–34.0)
MCHC: 32.5 g/dL (ref 30.0–36.0)
MCV: 93.5 fL (ref 78.0–100.0)
Platelets: 228 10*3/uL (ref 150–400)
RBC: 3.82 MIL/uL — ABNORMAL LOW (ref 3.87–5.11)
RDW: 13.7 % (ref 11.5–15.5)
WBC: 10 10*3/uL (ref 4.0–10.5)

## 2014-08-21 LAB — BASIC METABOLIC PANEL
ANION GAP: 6 (ref 5–15)
BUN: 8 mg/dL (ref 6–20)
CO2: 33 mmol/L — ABNORMAL HIGH (ref 22–32)
Calcium: 8.1 mg/dL — ABNORMAL LOW (ref 8.9–10.3)
Chloride: 94 mmol/L — ABNORMAL LOW (ref 101–111)
Creatinine, Ser: 0.78 mg/dL (ref 0.44–1.00)
GFR calc Af Amer: >60 mL/min (ref >60)
GFR calc non Af Amer: >60 mL/min (ref >60)
Glucose, Bld: 134 mg/dL — ABNORMAL HIGH (ref 65–99)
POTASSIUM: 4.9 mmol/L (ref 3.5–5.1)
Sodium: 133 mmol/L — ABNORMAL LOW (ref 135–145)

## 2014-08-21 MED ORDER — DIPHENOXYLATE-ATROPINE 2.5-0.025 MG PO TABS: 2.0000 | ORAL_TABLET | Freq: Three times a day (TID) | ORAL | Status: DC

## 2014-08-21 NOTE — Progress Notes (Signed)
NURSING PROGRESS NOTE  [name] [id number] Discharge Data: 08/21/2014 11:07 AM Attending Provider: Domenic Polite, MD HDI:[id number] D, MD   [name] to be D/C'd Home per MD order with hard copy of prescription via wheelchair with Nurse Genoveva Ill.    All IV's will be discontinued and monitored for bleeding.  All belongings will be returned to patient for patient to take home.  Last Documented Vital Signs:  Blood pressure 154/73, pulse 68, temperature 98 F (36.7 C), temperature source Oral, resp. rate 18, height 5\' 9"  (1.753 m), weight 104.237 kg (229 lb 12.8 oz), SpO2 97 %.  Hendricks Limes RN, BS, BSN

## 2014-08-21 NOTE — Care Management (Signed)
Important Message  Patient Details  Name: [name] MRN: [id number] Date of Birth: 18-Apr-1940   Medicare Important Message Given:  Yes-second notification given    Loann Quill 08/21/2014, 10:02 AM

## 2014-08-21 NOTE — Progress Notes (Signed)
Subjective: Two small bloody bowel movements.  ABM cramping.  Objective: Vital signs in last 24 hours: Temp:  [98 F (36.7 C)-98.5 F (36.9 C)] 98 F (36.7 C) (07/04 0610) Pulse Rate:  [68-70] 68 (07/04 0610) Resp:  [15-18] 18 (07/04 0610) BP: (126-154)/(69-73) 154/73 mmHg (07/04 0610) SpO2:  [95 %-98 %] 97 % (07/04 0610) Last BM Date: 08/20/14  Intake/Output from previous day: 07/03 0701 - 07/04 0700 In: 690 [P.O.:690] Out: -  Intake/Output this shift: Total I/O In: -  Out: 600 [Urine:600]  General appearance: alert and no distress GI: mild tenderness  Lab Results:  Recent Labs  08/20/14 0507 08/21/14 0556  WBC 9.5 10.0  HGB 12.6 11.6*  HCT 38.3 35.7*  PLT 270 228   BMET  Recent Labs  08/20/14 0507 08/21/14 0556  NA 131* 133*  K 4.9 4.9  CL 91* 94*  CO2 32 33*  GLUCOSE 144* 134*  BUN 8 8  CREATININE 0.84 0.78  CALCIUM 8.2* 8.1*   LFT No results for input(s): PROT, ALBUMIN, AST, ALT, ALKPHOS, BILITOT, BILIDIR, IBILI in the last 72 hours. PT/INR No results for input(s): LABPROT, INR in the last 72 hours. Hepatitis Panel No results for input(s): HEPBSAG, HCVAB, HEPAIGM, HEPBIGM in the last 72 hours. C-Diff No results for input(s): CDIFFTOX in the last 72 hours. Fecal Lactopherrin No results for input(s): FECLLACTOFRN in the last 72 hours.  Studies/Results: No results found.  Medications:  Scheduled: . diphenoxylate-atropine  2 tablet Oral TID AC & HS  . hydrocortisone-pramoxine  1 applicator Rectal QHS  . mesalamine  4.8 g Oral Q breakfast  . metoprolol tartrate  12.5 mg Oral BID  . metroNIDAZOLE  500 mg Oral 4 times per day  . multivitamin with minerals  1 tablet Oral Daily  . predniSONE  20 mg Oral BID  . rosuvastatin  40 mg Oral QHS  . saccharomyces boulardii  250 mg Oral BID   Continuous:   Assessment/Plan: 1) Left sided UC.   She remains stable.  Even though she continues to have some bloody bowel movements, which are small in  volume, I think she can be safely discharged home.  Her HGB has been stable over a number of days.  Her UC can be manage as an outpatient with [facility].  Plan: 1) Continue with steroids and mesalamine.  She can maintain her 40 mg dosing of steroids until she follows up with [facility]. 2) Follow up with [facility] in 2 weeks.   LOS: 14 days   Sukari Grist D 08/21/2014, 8:33 AM

## 2014-08-21 NOTE — Discharge Summary (Signed)
Physician Discharge Summary  [name] XNA:[id number] DOB: 06-24-1940 DOA: 08/07/2014  PCP: Kandice Hams, MD  Admit date: 08/07/2014 Discharge date: 08/21/2014  Time spent: 45 minutes  Recommendations for Outpatient Follow-up:  1. Rudolpho Sevin Amy [name] PA on 7/15 2. Dr.Polite in 1 week  Discharge Diagnoses:    Ulcerative colitis flare   Essential hypertension   Coronary atherosclerosis   Ulcerative colitis   Dehydration   Diarrhea   Antibiotic-associated diarrhea   Discharge Condition: stable  Diet recommendation: regular  Filed Weights   08/07/14 1143 08/07/14 1529  Weight: 102.967 kg (227 lb) 104.237 kg (229 lb 12.8 oz)    History of present illness:  Chief Complaint: weakness, diarrhea   HPI: [name] is a 74 y.o. female with past medical history of ulcerative colitis, CAD status post CABG, carotid artery disease status post CEA was started on clindamycin for toe infection 2 weeks ago subsequently developed severe diarrhea and clindamycin was stopped. She was seen in the [facility] office on Thursday, was tested for C. difficile which was negative she was started on Floraster and a trial of Uceric. Unfortunately still continued having loose stools almost every hour, intermittently has noticed blood in her stools. Due to progressive weakness and worsening diarrhea presented to the ER 7/4.  Hospital Course:  Ulcerative colitis Flare-Protracted -followed closely by [facility], was having >12 episodes of diarrhea on admission mixed with stool - s/p flex sig 08/08/2014, notable for diffuse edema, concerning for UC with pseudomembranes  -c diff PCR was negative - based on FLex sig findings was started on oral vancomycin 6/21 and solumedrol per GI recommendations, oral vancomycin was discontinued 6/23 , started on Flagyl IV per GI team on 6/24 and changed to PO 6/27, for 7 day course, which was completed 7/3 - pt transitioned to oral prednisone 20 mg PO BID on  6/27 - continue Probiotic, Mesalamine, Proctofoam - making some progress albeit slowly, 3-4 episodes of bloody diarrhea in the last 2-3days - since she is overall stable and now having only 3-4 stools a day with streaks of blood, she is discharged home today, GI considering AZA as outpatient -she is discharged home on same dose of prednisone of 20mg  BID until GI FU  Hypokalemia - supplemented  Acute renal insufficiency  - secondary to pre renal etiology from diarrhea - resolved with hydration - due to Leg edema, IVF stopped 6/25 and pt given one dose of Lasix 20 mg PO x 1 dose 6/26 and 6/27 -stable since then  CAD/CABG Continue lopressor and statin  HTN - stable  HLD - continue statin   Obesity - Body mass index is 33.92 kg/(m^2).  Flex Sig 6/21 bu Dr. Carlean Purl  ENDOSCOPIC IMPRESSION: 1) diffuse edema, granular and friable mucosa rectum to 40 cm - looks like UC - biopsied 2) Pseudomembranes over inflamed but mostly NL mucosa 40-70 cm -biopsies taken        Consultations:  [facility]  Discharge Exam: Filed Vitals:   08/21/14 0610  BP: 154/73  Pulse: 68  Temp: 98 F (36.7 C)  Resp: 18    General: AAOx3 Cardiovascular:S1S2/RRR Respiratory: CTAB  Discharge Instructions   Discharge Instructions    Diet - low sodium heart healthy    Complete by:  As directed      Increase activity slowly    Complete by:  As directed           Discharge Medication List as of 08/21/2014 10:55 AM  START taking these medications   Details  diphenoxylate-atropine (LOMOTIL) 2.5-0.025 MG per tablet Take 2 tablets by mouth 4 (four) times daily -  before meals and at bedtime., Starting 08/21/2014, Until Discontinued, Print      CONTINUE these medications which have NOT CHANGED   Details  aspirin EC 81 MG tablet Take 81 mg by mouth as needed for mild pain. , Until Discontinued, Historical Med    CRESTOR 40 MG tablet TAKE 1 TABLET AT BEDTIME, Normal    mesalamine (LIALDA)  1.2 G EC tablet take 4 tablets by mouth once daily, Fax    metoprolol tartrate (LOPRESSOR) 25 MG tablet take 1 tablet by mouth twice a day, Normal    predniSONE (DELTASONE) 10 MG tablet Take 2 tablets (20 mg total) by mouth 2 (two) times daily., Starting 08/06/2014, Until Discontinued, Normal    saccharomyces boulardii (FLORASTOR) 250 MG capsule Take 1 capsule (250 mg total) by mouth 2 (two) times daily., Starting 08/03/2014, Until Discontinued, Normal      STOP taking these medications     hydrochlorothiazide (MICROZIDE) 12.5 MG capsule      meloxicam (MOBIC) 15 MG tablet      potassium chloride 20 MEQ TBCR      valsartan (DIOVAN) 160 MG tablet      clindamycin (CLEOCIN) 300 MG capsule        Allergies  Allergen Reactions  . Clindamycin/Lincomycin Diarrhea  . Ivp Dye [Iodinated Diagnostic Agents] Other (See Comments)    "almost passed out"   Follow-up Information    Follow up with Nicoletta Ba, PA-C On 09/01/2014.   Specialty:  Gastroenterology   Why:  1:30 PM for colitis follow up at GI office, can call office to reschedule appointment to sooner   Contact information:   [place] [address] [phone]       Follow up with Kandice Hams, MD. Schedule an appointment as soon as possible for a visit in 1 week.   Specialty:  Internal Medicine   Contact information:   [address] [place] [address] [phone]        The results of significant diagnostics from this hospitalization (including imaging, microbiology, ancillary and laboratory) are listed below for reference.    Significant Diagnostic Studies: Dg Foot Complete Right  08/17/14   CLINICAL DATA:  Tripped and fell when beginning or from a chair on 07/15/2014, RIGHT foot striking chair leg, increased pain at lateral RIGHT foot at little toe with redness today  EXAM: None  COMPARISON:  None  FINDINGS: Osseous demineralization.  Slight widening of PIP joints of the fourth  and fifth toes, suspect old.  Remaining joint space is normal.  No acute fracture, dislocation, or bone destruction.  Small plantar calcaneal spur.  IMPRESSION: No acute osseous abnormalities.   Electronically Signed   By: Lavonia Dana M.D.   On: 08/17/14 21:01    Microbiology: No results found for this or any previous visit (from the past 240 hour(s)).   Labs: Basic Metabolic Panel:  Recent Labs Lab 08/16/14 0554 08/17/14 0353 08/18/14 0414 08/20/14 0507 08/21/14 0556  NA 135 133* 133* 131* 133*  K 4.1 4.5 4.9 4.9 4.9  CL 97* 95* 93* 91* 94*  CO2 26 29 32 32 33*  GLUCOSE 140* 157* 163* 144* 134*  BUN 13 12 11 8 8   CREATININE 0.86 0.92 0.87 0.84 0.78  CALCIUM 8.6* 7.9* 8.0* 8.2* 8.1*   Liver Function Tests: No  results for input(s): AST, ALT, ALKPHOS, BILITOT, PROT, ALBUMIN in the last 168 hours. No results for input(s): LIPASE, AMYLASE in the last 168 hours. No results for input(s): AMMONIA in the last 168 hours. CBC:  Recent Labs Lab 08/17/14 0353 08/18/14 0414 08/20/14 0507 08/21/14 0556  WBC 11.0* 11.3* 9.5 10.0  HGB 12.4 12.8 12.6 11.6*  HCT 37.6 38.7 38.3 35.7*  MCV 92.8 92.8 92.5 93.5  PLT 230 251 270 228   Cardiac Enzymes: No results for input(s): CKTOTAL, CKMB, CKMBINDEX, TROPONINI in the last 168 hours. BNP: BNP (last 3 results) No results for input(s): BNP in the last 8760 hours.  ProBNP (last 3 results) No results for input(s): PROBNP in the last 8760 hours.  CBG: No results for input(s): GLUCAP in the last 168 hours.     SignedDomenic Polite  [facility] 08/21/2014, 1:34 PM

## 2014-08-22 ENCOUNTER — Inpatient Hospital Stay (HOSPITAL_COMMUNITY)
Admission: EM | Admit: 2014-08-22 | Discharge: 2014-09-21 | DRG: 853 | Disposition: A | Discharge status: Skilled Nursing Facility | Payer: Commercial Managed Care - HMO | Attending: Internal Medicine | Admitting: Internal Medicine

## 2014-08-22 ENCOUNTER — Emergency Department (HOSPITAL_COMMUNITY): Payer: Commercial Managed Care - HMO | Admitting: Certified Registered Nurse Anesthetist

## 2014-08-22 ENCOUNTER — Telehealth: Payer: Self-pay | Admitting: Gastroenterology

## 2014-08-22 ENCOUNTER — Encounter (HOSPITAL_COMMUNITY)
Admission: EM | Disposition: A | Discharge status: Skilled Nursing Facility | Payer: Self-pay | Source: Home / Self Care | Attending: Internal Medicine

## 2014-08-22 ENCOUNTER — Ambulatory Visit: Payer: BC Managed Care – PPO | Admitting: Nurse Practitioner

## 2014-08-22 ENCOUNTER — Encounter (HOSPITAL_COMMUNITY): Payer: Self-pay | Admitting: Emergency Medicine

## 2014-08-22 ENCOUNTER — Emergency Department (HOSPITAL_COMMUNITY): Payer: Commercial Managed Care - HMO

## 2014-08-22 DIAGNOSIS — I82409 Acute embolism and thrombosis of unspecified deep veins of unspecified lower extremity: Secondary | ICD-10-CM | POA: Diagnosis present

## 2014-08-22 DIAGNOSIS — E46 Unspecified protein-calorie malnutrition: Secondary | ICD-10-CM | POA: Diagnosis not present

## 2014-08-22 DIAGNOSIS — Z9049 Acquired absence of other specified parts of digestive tract: Secondary | ICD-10-CM | POA: Diagnosis present

## 2014-08-22 DIAGNOSIS — K51911 Ulcerative colitis, unspecified with rectal bleeding: Secondary | ICD-10-CM | POA: Diagnosis present

## 2014-08-22 DIAGNOSIS — IMO0001 Reserved for inherently not codable concepts without codable children: Secondary | ICD-10-CM | POA: Insufficient documentation

## 2014-08-22 DIAGNOSIS — I82402 Acute embolism and thrombosis of unspecified deep veins of left lower extremity: Secondary | ICD-10-CM | POA: Diagnosis not present

## 2014-08-22 DIAGNOSIS — Z79899 Other long term (current) drug therapy: Secondary | ICD-10-CM

## 2014-08-22 DIAGNOSIS — R52 Pain, unspecified: Secondary | ICD-10-CM | POA: Diagnosis not present

## 2014-08-22 DIAGNOSIS — I82492 Acute embolism and thrombosis of other specified deep vein of left lower extremity: Secondary | ICD-10-CM | POA: Diagnosis not present

## 2014-08-22 DIAGNOSIS — Z823 Family history of stroke: Secondary | ICD-10-CM

## 2014-08-22 DIAGNOSIS — E876 Hypokalemia: Secondary | ICD-10-CM | POA: Diagnosis not present

## 2014-08-22 DIAGNOSIS — I10 Essential (primary) hypertension: Secondary | ICD-10-CM | POA: Diagnosis not present

## 2014-08-22 DIAGNOSIS — G9340 Encephalopathy, unspecified: Secondary | ICD-10-CM | POA: Diagnosis not present

## 2014-08-22 DIAGNOSIS — T380X5A Adverse effect of glucocorticoids and synthetic analogues, initial encounter: Secondary | ICD-10-CM | POA: Diagnosis present

## 2014-08-22 DIAGNOSIS — I959 Hypotension, unspecified: Secondary | ICD-10-CM | POA: Diagnosis present

## 2014-08-22 DIAGNOSIS — I679 Cerebrovascular disease, unspecified: Secondary | ICD-10-CM

## 2014-08-22 DIAGNOSIS — K611 Rectal abscess: Secondary | ICD-10-CM

## 2014-08-22 DIAGNOSIS — K51913 Ulcerative colitis, unspecified with fistula: Secondary | ICD-10-CM | POA: Diagnosis not present

## 2014-08-22 DIAGNOSIS — E039 Hypothyroidism, unspecified: Secondary | ICD-10-CM | POA: Diagnosis present

## 2014-08-22 DIAGNOSIS — E782 Mixed hyperlipidemia: Secondary | ICD-10-CM | POA: Diagnosis present

## 2014-08-22 DIAGNOSIS — Z86010 Personal history of colonic polyps: Secondary | ICD-10-CM | POA: Diagnosis not present

## 2014-08-22 DIAGNOSIS — K51914 Ulcerative colitis, unspecified with abscess: Secondary | ICD-10-CM

## 2014-08-22 DIAGNOSIS — Z800 Family history of malignant neoplasm of digestive organs: Secondary | ICD-10-CM | POA: Diagnosis not present

## 2014-08-22 DIAGNOSIS — Z932 Ileostomy status: Secondary | ICD-10-CM | POA: Diagnosis not present

## 2014-08-22 DIAGNOSIS — K5190 Ulcerative colitis, unspecified, without complications: Secondary | ICD-10-CM | POA: Diagnosis not present

## 2014-08-22 DIAGNOSIS — J00 Acute nasopharyngitis [common cold]: Secondary | ICD-10-CM | POA: Diagnosis not present

## 2014-08-22 DIAGNOSIS — D696 Thrombocytopenia, unspecified: Secondary | ICD-10-CM | POA: Diagnosis present

## 2014-08-22 DIAGNOSIS — Z951 Presence of aortocoronary bypass graft: Secondary | ICD-10-CM | POA: Diagnosis not present

## 2014-08-22 DIAGNOSIS — Z8673 Personal history of transient ischemic attack (TIA), and cerebral infarction without residual deficits: Secondary | ICD-10-CM | POA: Diagnosis not present

## 2014-08-22 DIAGNOSIS — D72829 Elevated white blood cell count, unspecified: Secondary | ICD-10-CM | POA: Diagnosis present

## 2014-08-22 DIAGNOSIS — K219 Gastro-esophageal reflux disease without esophagitis: Secondary | ICD-10-CM | POA: Diagnosis present

## 2014-08-22 DIAGNOSIS — R531 Weakness: Secondary | ICD-10-CM

## 2014-08-22 DIAGNOSIS — M1990 Unspecified osteoarthritis, unspecified site: Secondary | ICD-10-CM | POA: Diagnosis present

## 2014-08-22 DIAGNOSIS — Z87891 Personal history of nicotine dependence: Secondary | ICD-10-CM | POA: Diagnosis not present

## 2014-08-22 DIAGNOSIS — E871 Hypo-osmolality and hyponatremia: Secondary | ICD-10-CM | POA: Diagnosis present

## 2014-08-22 DIAGNOSIS — E860 Dehydration: Secondary | ICD-10-CM

## 2014-08-22 DIAGNOSIS — I252 Old myocardial infarction: Secondary | ICD-10-CM

## 2014-08-22 DIAGNOSIS — I82403 Acute embolism and thrombosis of unspecified deep veins of lower extremity, bilateral: Secondary | ICD-10-CM | POA: Diagnosis not present

## 2014-08-22 DIAGNOSIS — I2510 Atherosclerotic heart disease of native coronary artery without angina pectoris: Secondary | ICD-10-CM

## 2014-08-22 DIAGNOSIS — Z6832 Body mass index (BMI) 32.0-32.9, adult: Secondary | ICD-10-CM

## 2014-08-22 DIAGNOSIS — A419 Sepsis, unspecified organism: Principal | ICD-10-CM | POA: Diagnosis present

## 2014-08-22 DIAGNOSIS — K51814 Other ulcerative colitis with abscess: Secondary | ICD-10-CM | POA: Diagnosis not present

## 2014-08-22 DIAGNOSIS — B379 Candidiasis, unspecified: Secondary | ICD-10-CM | POA: Diagnosis present

## 2014-08-22 DIAGNOSIS — O2230 Deep phlebothrombosis in pregnancy, unspecified trimester: Secondary | ICD-10-CM

## 2014-08-22 DIAGNOSIS — R159 Full incontinence of feces: Secondary | ICD-10-CM | POA: Diagnosis not present

## 2014-08-22 DIAGNOSIS — D62 Acute posthemorrhagic anemia: Secondary | ICD-10-CM | POA: Diagnosis not present

## 2014-08-22 DIAGNOSIS — I82441 Acute embolism and thrombosis of right tibial vein: Secondary | ICD-10-CM | POA: Diagnosis not present

## 2014-08-22 DIAGNOSIS — E119 Type 2 diabetes mellitus without complications: Secondary | ICD-10-CM | POA: Diagnosis present

## 2014-08-22 DIAGNOSIS — M729 Fibroblastic disorder, unspecified: Secondary | ICD-10-CM | POA: Insufficient documentation

## 2014-08-22 DIAGNOSIS — K612 Anorectal abscess: Secondary | ICD-10-CM | POA: Diagnosis not present

## 2014-08-22 DIAGNOSIS — K529 Noninfective gastroenteritis and colitis, unspecified: Secondary | ICD-10-CM | POA: Diagnosis present

## 2014-08-22 DIAGNOSIS — K6389 Other specified diseases of intestine: Secondary | ICD-10-CM | POA: Diagnosis not present

## 2014-08-22 DIAGNOSIS — R109 Unspecified abdominal pain: Secondary | ICD-10-CM | POA: Diagnosis present

## 2014-08-22 HISTORY — PX: INCISION AND DRAINAGE PERIRECTAL ABSCESS: SHX1804

## 2014-08-22 LAB — SAMPLE TO BLOOD BANK

## 2014-08-22 LAB — COMPREHENSIVE METABOLIC PANEL
ALT: 14 U/L (ref 14–54)
ANION GAP: 7 (ref 5–15)
AST: 18 U/L (ref 15–41)
Albumin: 2.3 g/dL — ABNORMAL LOW (ref 3.5–5.0)
Alkaline Phosphatase: 57 U/L (ref 38–126)
BUN: 11 mg/dL (ref 6–20)
CO2: 29 mmol/L (ref 22–32)
Calcium: 7.8 mg/dL — ABNORMAL LOW (ref 8.9–10.3)
Chloride: 94 mmol/L — ABNORMAL LOW (ref 101–111)
Creatinine, Ser: 0.87 mg/dL (ref 0.44–1.00)
GFR calc Af Amer: >60 mL/min (ref >60)
GFR calc non Af Amer: >60 mL/min (ref >60)
GLUCOSE: 145 mg/dL — AB (ref 65–99)
POTASSIUM: 4.4 mmol/L (ref 3.5–5.1)
SODIUM: 130 mmol/L — AB (ref 135–145)
Total Bilirubin: 0.8 mg/dL (ref 0.3–1.2)
Total Protein: 5.5 g/dL — ABNORMAL LOW (ref 6.5–8.1)

## 2014-08-22 LAB — CBC WITH DIFFERENTIAL/PLATELET
BASOS ABS: 0 10*3/uL (ref 0.0–0.1)
Basophils Relative: 0 % (ref 0–1)
EOS ABS: 0 10*3/uL (ref 0.0–0.7)
Eosinophils Relative: 0 % (ref 0–5)
HCT: 34.7 % — ABNORMAL LOW (ref 36.0–46.0)
Hemoglobin: 11.3 g/dL — ABNORMAL LOW (ref 12.0–15.0)
LYMPHS ABS: 0.8 10*3/uL (ref 0.7–4.0)
Lymphocytes Relative: 6 % — ABNORMAL LOW (ref 12–46)
MCH: 30.4 pg (ref 26.0–34.0)
MCHC: 32.6 g/dL (ref 30.0–36.0)
MCV: 93.3 fL (ref 78.0–100.0)
MONO ABS: 0.5 10*3/uL (ref 0.1–1.0)
Monocytes Relative: 4 % (ref 3–12)
NEUTROS ABS: 11.9 10*3/uL — AB (ref 1.7–7.7)
Neutrophils Relative %: 90 % — ABNORMAL HIGH (ref 43–77)
PLATELETS: 236 10*3/uL (ref 150–400)
RBC: 3.72 MIL/uL — ABNORMAL LOW (ref 3.87–5.11)
RDW: 14.1 % (ref 11.5–15.5)
WBC Morphology: INCREASED
WBC: 13.2 10*3/uL — ABNORMAL HIGH (ref 4.0–10.5)

## 2014-08-22 LAB — QUANTIFERON IN TUBE
QFT TB AG MINUS NIL VALUE: 0 [IU]/mL
QUANTIFERON MITOGEN VALUE: 0.06 IU/mL
QUANTIFERON TB AG VALUE: 0.03 IU/mL
QUANTIFERON TB GOLD: UNDETERMINED
Quantiferon Nil Value: 0.03 IU/mL

## 2014-08-22 LAB — PROTIME-INR
INR: 1.28 (ref 0.00–1.49)
PROTHROMBIN TIME: 16.1 s — AB (ref 11.6–15.2)

## 2014-08-22 LAB — APTT: aPTT: 23 seconds — ABNORMAL LOW (ref 24–37)

## 2014-08-22 LAB — QUANTIFERON TB GOLD ASSAY (BLOOD)

## 2014-08-22 LAB — POC OCCULT BLOOD, ED: Fecal Occult Bld: POSITIVE — AB

## 2014-08-22 LAB — I-STAT CG4 LACTIC ACID, ED: LACTIC ACID, VENOUS: 1.36 mmol/L (ref 0.5–2.0)

## 2014-08-22 SURGERY — INCISION AND DRAINAGE, ABSCESS, PERIRECTAL
Anesthesia: General | Site: Buttocks

## 2014-08-22 MED ORDER — ONDANSETRON HCL 4 MG/2ML IJ SOLN
4.0000 mg | Freq: Once | INTRAMUSCULAR | Status: AC
  Administered 2014-08-22: 4 mg via INTRAVENOUS
  Filled 2014-08-22: qty 2

## 2014-08-22 MED ORDER — DIPHENHYDRAMINE HCL 50 MG/ML IJ SOLN
25.0000 mg | Freq: Once | INTRAMUSCULAR | Status: AC
  Administered 2014-08-22: 25 mg via INTRAVENOUS
  Filled 2014-08-22: qty 1

## 2014-08-22 MED ORDER — SODIUM CHLORIDE 0.9 % IV BOLUS (SEPSIS)
500.0000 mL | Freq: Once | INTRAVENOUS | Status: AC
  Administered 2014-08-22: 500 mL via INTRAVENOUS

## 2014-08-22 MED ORDER — LIDOCAINE HCL (CARDIAC) 20 MG/ML IV SOLN
INTRAVENOUS | Status: AC
  Filled 2014-08-22: qty 5

## 2014-08-22 MED ORDER — FENTANYL CITRATE (PF) 100 MCG/2ML IJ SOLN
INTRAMUSCULAR | Status: AC
  Filled 2014-08-22: qty 2

## 2014-08-22 MED ORDER — METHYLPREDNISOLONE SODIUM SUCC 125 MG IJ SOLR
125.0000 mg | Freq: Once | INTRAMUSCULAR | Status: AC
  Administered 2014-08-22: 125 mg via INTRAVENOUS
  Filled 2014-08-22: qty 2

## 2014-08-22 MED ORDER — PROPOFOL 10 MG/ML IV BOLUS
INTRAVENOUS | Status: AC
  Filled 2014-08-22: qty 20

## 2014-08-22 MED ORDER — PIPERACILLIN-TAZOBACTAM 3.375 G IVPB
3.3750 g | Freq: Three times a day (TID) | INTRAVENOUS | Status: DC
  Administered 2014-08-23 – 2014-09-05 (×38): 3.375 g via INTRAVENOUS
  Filled 2014-08-22 (×41): qty 50

## 2014-08-22 MED ORDER — LIDOCAINE HCL (CARDIAC) 20 MG/ML IV SOLN
INTRAVENOUS | Status: DC | PRN
  Administered 2014-08-22: 50 mg via INTRAVENOUS

## 2014-08-22 MED ORDER — MORPHINE SULFATE 4 MG/ML IJ SOLN
4.0000 mg | Freq: Once | INTRAMUSCULAR | Status: AC
  Administered 2014-08-22: 4 mg via INTRAVENOUS
  Filled 2014-08-22: qty 1

## 2014-08-22 MED ORDER — MIDAZOLAM HCL 2 MG/2ML IJ SOLN
INTRAMUSCULAR | Status: AC
  Filled 2014-08-22: qty 2

## 2014-08-22 MED ORDER — PIPERACILLIN-TAZOBACTAM 3.375 G IVPB 30 MIN
3.3750 g | INTRAVENOUS | Status: AC
Start: 2014-08-22 — End: 2014-08-22
  Administered 2014-08-22: 3.375 g via INTRAVENOUS
  Filled 2014-08-22: qty 50

## 2014-08-22 MED ORDER — SODIUM CHLORIDE 0.9 % IV SOLN
INTRAVENOUS | Status: DC
  Administered 2014-08-22: 22:00:00 via INTRAVENOUS

## 2014-08-22 MED ORDER — PROPOFOL 10 MG/ML IV BOLUS
INTRAVENOUS | Status: DC | PRN
  Administered 2014-08-22: 50 mg via INTRAVENOUS
  Administered 2014-08-22: 150 mg via INTRAVENOUS

## 2014-08-22 MED ORDER — DEXAMETHASONE SODIUM PHOSPHATE 10 MG/ML IJ SOLN
INTRAMUSCULAR | Status: AC
  Filled 2014-08-22: qty 1

## 2014-08-22 MED ORDER — LACTATED RINGERS IV SOLN
INTRAVENOUS | Status: DC | PRN
  Administered 2014-08-22: 23:00:00 via INTRAVENOUS

## 2014-08-22 MED ORDER — PHENYLEPHRINE HCL 10 MG/ML IJ SOLN
INTRAMUSCULAR | Status: DC | PRN
  Administered 2014-08-22: 40 ug via INTRAVENOUS

## 2014-08-22 MED ORDER — IOHEXOL 300 MG/ML  SOLN
100.0000 mL | Freq: Once | INTRAMUSCULAR | Status: AC | PRN
  Administered 2014-08-22: 100 mL via INTRAVENOUS

## 2014-08-22 MED ORDER — FENTANYL CITRATE (PF) 100 MCG/2ML IJ SOLN
INTRAMUSCULAR | Status: DC | PRN
  Administered 2014-08-22 (×2): 25 ug via INTRAVENOUS

## 2014-08-22 MED ORDER — ONDANSETRON HCL 4 MG/2ML IJ SOLN
INTRAMUSCULAR | Status: DC | PRN
  Administered 2014-08-22 (×2): 2 mg via INTRAVENOUS

## 2014-08-22 MED ORDER — ONDANSETRON HCL 4 MG/2ML IJ SOLN
INTRAMUSCULAR | Status: AC
  Filled 2014-08-22: qty 2

## 2014-08-22 MED ORDER — SODIUM CHLORIDE 0.9 % IV SOLN
2000.0000 mg | INTRAVENOUS | Status: AC
Start: 2014-08-22 — End: 2014-08-23
  Administered 2014-08-22: 2000 mg via INTRAVENOUS
  Filled 2014-08-22: qty 2000

## 2014-08-22 MED ORDER — VANCOMYCIN HCL IN DEXTROSE 750-5 MG/150ML-% IV SOLN
750.0000 mg | Freq: Two times a day (BID) | INTRAVENOUS | Status: DC
  Administered 2014-08-23 – 2014-08-24 (×4): 750 mg via INTRAVENOUS
  Filled 2014-08-22 (×5): qty 150

## 2014-08-22 SURGICAL SUPPLY — 28 items
BLADE HEX COATED 2.75 (ELECTRODE) ×3 IMPLANT
BLADE SURG 15 STRL LF DISP TIS (BLADE) ×1 IMPLANT
BLADE SURG 15 STRL SS (BLADE) ×3
DRAIN PENROSE 18X1/2 LTX STRL (DRAIN) ×2 IMPLANT
DRAIN PENROSE 18X1/4 LTX STRL (WOUND CARE) ×2 IMPLANT
DRAPE LAPAROTOMY T 102X78X121 (DRAPES) ×3 IMPLANT
DRSG PAD ABDOMINAL 8X10 ST (GAUZE/BANDAGES/DRESSINGS) ×3 IMPLANT
ELECT REM PT RETURN 9FT ADLT (ELECTROSURGICAL) ×3
ELECTRODE REM PT RTRN 9FT ADLT (ELECTROSURGICAL) ×1 IMPLANT
GAUZE SPONGE 4X4 12PLY STRL (GAUZE/BANDAGES/DRESSINGS) ×3 IMPLANT
GAUZE SPONGE 4X4 16PLY XRAY LF (GAUZE/BANDAGES/DRESSINGS) ×3 IMPLANT
GLOVE ECLIPSE 8.0 STRL XLNG CF (GLOVE) ×3 IMPLANT
GLOVE INDICATOR 8.0 STRL GRN (GLOVE) ×3 IMPLANT
GOWN STRL REUS W/TWL XL LVL3 (GOWN DISPOSABLE) ×6 IMPLANT
KIT BASIN OR (CUSTOM PROCEDURE TRAY) ×3 IMPLANT
LUBRICANT JELLY K Y 4OZ (MISCELLANEOUS) ×3 IMPLANT
NDL HYPO 25X1 1.5 SAFETY (NEEDLE) ×1 IMPLANT
NEEDLE HYPO 25X1 1.5 SAFETY (NEEDLE) ×3 IMPLANT
PACK LITHOTOMY IV (CUSTOM PROCEDURE TRAY) ×3 IMPLANT
PAD ABD 8X10 STRL (GAUZE/BANDAGES/DRESSINGS) ×2 IMPLANT
PEN SKIN MARKING BROAD (MISCELLANEOUS) ×3 IMPLANT
PENCIL BUTTON HOLSTER BLD 10FT (ELECTRODE) ×3 IMPLANT
SPONGE SURGIFOAM ABS GEL 12-7 (HEMOSTASIS) ×7 IMPLANT
SUT CHROMIC 3 0 SH 27 (SUTURE) ×2 IMPLANT
SYR CONTROL 10ML LL (SYRINGE) ×3 IMPLANT
TOWEL OR 17X26 10 PK STRL BLUE (TOWEL DISPOSABLE) ×3 IMPLANT
TOWEL OR NON WOVEN STRL DISP B (DISPOSABLE) ×3 IMPLANT
YANKAUER SUCT BULB TIP 10FT TU (MISCELLANEOUS) ×3 IMPLANT

## 2014-08-22 NOTE — ED Notes (Signed)
Gave report to CRNA

## 2014-08-22 NOTE — ED Notes (Signed)
Per EMS. Pt from home. Reports abd pain and rectal bleeding that began this am after having hard bowel movement. Pt thinks she has a hemmorhoid and has pain with sitting on rectal area. Pt was released from [facility] yesterday after having toe infection and ulcerative colitis flare-up.

## 2014-08-22 NOTE — ED Notes (Signed)
Bed: [facility] Expected date:  Expected time:  Means of arrival:  Comments: UC flare up

## 2014-08-22 NOTE — ED Notes (Signed)
Patient transported to CT 

## 2014-08-22 NOTE — Consult Note (Signed)
Reason for Consult:  Severe rectal pain, perirectal infectious process extending superiorly Referring Physician:   Dr. Silvio Clayman [name] is an 74 y.o. female.  HPI:  She was discharged yesterday from the hospital after being treated for ulcerative colitis exacerbation. At discharge she was having some peri-anal pain as well as lower abdominal pain. The pain escalated last night and into today. She came back to the emergency department complaining of severe rectal pain.CT scan demonstrated a complex perirectal infection extending up toward the retroperitoneal area. She has not had any fevers or chills.  She is on prednisone and mesalamine for her ulcerative colitis.  Flexible sigmoidoscopy on 08/08/2014 demonstrated inflammatory changes from the rectum and 40 cm proximal consistent with ulcerative colitis.  Biopsy results were consistent with this. I was asked to see her because of the CT scan findings. She is still having some lower abdominal pain.  Past Medical History  Diagnosis Date  . CAD (coronary artery disease)     unspecified  . Cerebrovascular disease   . Hyperlipidemia, mixed   . HTN (hypertension)   . Ulcerative colitis   . Diverticulitis   . Colon polyps 2006  . Thyroid disease   . Myocardial infarct   . Carotid artery occlusion 2010  . Diabetes mellitus without complication     Borderline  . GERD (gastroesophageal reflux disease)   . Arthritis     Past Surgical History  Procedure Laterality Date  . Cholecystectomy  7.06.2008    with cholangiogram  . 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 Abdominal hysterectomy    . Colonoscopy  multiple  . Sigmoidoscopy  multiple  . Flexible sigmoidoscopy N/A 08/08/2014    Procedure: FLEXIBLE SIGMOIDOSCOPY;  Surgeon: Gatha Mayer, MD;  Location: [facility];  Service: Endoscopy;  Laterality: N/A;    Family History  Problem Relation Age of Onset  . Colitis Brother     undefined  . Stomach cancer Mother   . Stroke Father      died  . Colon cancer Neg Hx     Social History:  reports that she quit smoking about 18 months ago. Her smoking use included Cigarettes. She has a 12 pack-year smoking history. She has never used smokeless tobacco. She reports that she does not drink alcohol or use illicit drugs.  Allergies:  Allergies  Allergen Reactions  . Clindamycin/Lincomycin Diarrhea  . Ivp Dye [Iodinated Diagnostic Agents] Other (See Comments)    "almost passed out"    Prior to Admission medications   Medication Sig Start Date End Date Taking? Authorizing Provider  CRESTOR 40 MG tablet TAKE 1 TABLET AT BEDTIME Patient taking differently: TAKE 40 MG BY MOUTH AT BEDTIME 05/17/14  Yes Lelon Perla, MD  diphenoxylate-atropine (LOMOTIL) 2.5-0.025 MG per tablet Take 2 tablets by mouth 4 (four) times daily -  before meals and at bedtime. 08/21/14  Yes Domenic Polite, MD  mesalamine (LIALDA) 1.2 G EC tablet take 4 tablets by mouth once daily Patient taking differently: Take 4.8 g by mouth daily.  07/31/14  Yes Inda Castle, MD  metoprolol tartrate (LOPRESSOR) 25 MG tablet take 1 tablet by mouth twice a day Patient taking differently: TAKE 25 MG BY MOUTH TWICE DAILY 05/18/14  Yes Lelon Perla, MD  predniSONE (DELTASONE) 10 MG tablet Take 2 tablets (20 mg total) by mouth 2 (two) times daily. 08/06/14  Yes Milus Banister, MD  saccharomyces boulardii (FLORASTOR) 250 MG capsule Take 1 capsule (250  mg total) by mouth 2 (two) times daily. 08/03/14  Yes Amy S [name], PA-C  hydrochlorothiazide (MICROZIDE) 12.5 MG capsule Take 12.5 mg by mouth daily. 08/16/14   Historical Provider, MD     Results for orders placed or performed during the hospital encounter of 08/22/14 (from the past 48 hour(s))  CBC with Differential     Status: Abnormal   Collection Time: 08/22/14  4:10 PM  Result Value Ref Range   WBC 13.2 (H) 4.0 - 10.5 K/uL   RBC 3.72 (L) 3.87 - 5.11 MIL/uL   Hemoglobin 11.3 (L) 12.0 - 15.0 g/dL   HCT 34.7 (L)  36.0 - 46.0 %   MCV 93.3 78.0 - 100.0 fL   MCH 30.4 26.0 - 34.0 pg   MCHC 32.6 30.0 - 36.0 g/dL   RDW 14.1 11.5 - 15.5 %   Platelets 236 150 - 400 K/uL   Neutrophils Relative % 90 (H) 43 - 77 %   Lymphocytes Relative 6 (L) 12 - 46 %   Monocytes Relative 4 3 - 12 %   Eosinophils Relative 0 0 - 5 %   Basophils Relative 0 0 - 1 %   Neutro Abs 11.9 (H) 1.7 - 7.7 K/uL   Lymphs Abs 0.8 0.7 - 4.0 K/uL   Monocytes Absolute 0.5 0.1 - 1.0 K/uL   Eosinophils Absolute 0.0 0.0 - 0.7 K/uL   Basophils Absolute 0.0 0.0 - 0.1 K/uL   WBC Morphology INCREASED BANDS (>20% BANDS)   Comprehensive metabolic panel     Status: Abnormal   Collection Time: 08/22/14  4:10 PM  Result Value Ref Range   Sodium 130 (L) 135 - 145 mmol/L   Potassium 4.4 3.5 - 5.1 mmol/L   Chloride 94 (L) 101 - 111 mmol/L   CO2 29 22 - 32 mmol/L   Glucose, Bld 145 (H) 65 - 99 mg/dL   BUN 11 6 - 20 mg/dL   Creatinine, Ser 0.87 0.44 - 1.00 mg/dL   Calcium 7.8 (L) 8.9 - 10.3 mg/dL   Total Protein 5.5 (L) 6.5 - 8.1 g/dL   Albumin 2.3 (L) 3.5 - 5.0 g/dL   AST 18 15 - 41 U/L   ALT 14 14 - 54 U/L   Alkaline Phosphatase 57 38 - 126 U/L   Total Bilirubin 0.8 0.3 - 1.2 mg/dL   GFR calc non Af Amer >60 >60 mL/min   GFR calc Af Amer >60 >60 mL/min    Comment: (NOTE) The eGFR has been calculated using the CKD EPI equation. This calculation has not been validated in all clinical situations. eGFR's persistently <60 mL/min signify possible Chronic Kidney Disease.    Anion gap 7 5 - 15  Sample to Blood Bank     Status: None   Collection Time: 08/22/14  4:10 PM  Result Value Ref Range   Blood Bank Specimen SAMPLE AVAILABLE FOR TESTING    Sample Expiration 08/25/2014   Protime-INR     Status: Abnormal   Collection Time: 08/22/14  4:10 PM  Result Value Ref Range   Prothrombin Time 16.1 (H) 11.6 - 15.2 seconds   INR 1.28 0.00 - 1.49  APTT     Status: Abnormal   Collection Time: 08/22/14  4:10 PM  Result Value Ref Range   aPTT 23 (L)  24 - 37 seconds  POC occult blood, ED Provider will collect     Status: Abnormal   Collection Time: 08/22/14  4:17 PM  Result Value Ref  Range   Fecal Occult Bld POSITIVE (A) NEGATIVE  I-Stat CG4 Lactic Acid, ED     Status: None   Collection Time: 08/22/14  4:22 PM  Result Value Ref Range   Lactic Acid, Venous 1.36 0.5 - 2.0 mmol/L    Ct Abdomen Pelvis W Contrast  08/22/2014   CLINICAL DATA:  Patient with abdominal pain and rectal bleeding beginning this morning. Patient with recent ulcerative colitis flare up.  EXAM: CT ABDOMEN AND PELVIS WITH CONTRAST  TECHNIQUE: Multidetector CT imaging of the abdomen and pelvis was performed using the standard protocol following bolus administration of intravenous contrast.  CONTRAST:  114m OMNIPAQUE IOHEXOL 300 MG/ML  SOLN  COMPARISON:  CT 08/20/2006.  FINDINGS: Lower chest: Dependent atelectasis left lower lobe. Normal heart size. Small hiatal hernia.  Hepatobiliary: Liver is normal in size and contour without focal hepatic lesion identified. Patient status post cholecystectomy.  Pancreas: Unremarkable  Spleen: Unremarkable  Adrenals/Urinary Tract: Stable thickening left adrenal gland. 2.2 cm left renal cyst. Additional tiny bilateral lower attenuation renal lesions too small to characterize.  Stomach/Bowel: There is wall thickening at the level of the rectum. Additionally, best demonstrated on the coronal images (image 63; series 5) there is suggestion of probable perforation and adjacent small perirectal abscess. Wall thickening of the distal transverse and descending colon. There is an extensive amount of gas within the right gluteal soft tissues at the level of the rectum extending to the gluteal musculature and cranially throughout the retroperitoneum.  Vascular/Lymphatic: Extensive calcified atherosclerotic plaque involving the abdominal aorta. Infrarenal abdominal aortic ectasia, 2.2 cm. No retroperitoneal lymphadenopathy.  Other: Catheter is present within  the vagina. Contrast demonstrated within the vagina.  Musculoskeletal: Lower lumbar spine degenerative changes. No aggressive or acute appearing osseous lesions. Extensive amount of soft tissue gas within the right gluteal subcutaneous fat extending into the gluteal musculature. Additionally this gas extends cranially along the retroperitoneum. Soft tissue thickening of the right subcutaneous gluteal soft tissues.  IMPRESSION: Extensive soft tissue gas within the right gluteal fat extending into the right gluteal musculature as well as extending cranially throughout the retroperitoneum. Findings are concerning for an aggressive infectious process in the setting of retroperitoneal fasciitis. Potential causative etiology may be distal colitis with perforation and associated abscess formation. Recommend surgical consultation and broad-spectrum antibiotic therapy.  Wall thickening of the distal transverse and descending colon likely secondary to colitis.  Critical Value/emergent results were called by telephone at the time of interpretation on 08/22/2014 at 8:57 pm to [facility] , who verbally acknowledged these results.   Electronically Signed   By: DLovey NewcomerM.D.   On: 08/22/2014 21:09    Review of Systems  Constitutional: Negative for fever and chills.  Gastrointestinal: Positive for abdominal pain and diarrhea. Negative for vomiting.   Blood pressure 107/75, pulse 93, temperature 98.3 F (36.8 C), temperature source Oral, resp. rate 18, height 5' 9"  (1.753 m), weight 99.791 kg (220 lb), SpO2 93 %. Physical Exam  Constitutional: She appears well-developed and well-nourished. No distress.  HENT:  Head: Normocephalic and atraumatic.  GI: Soft. There is tenderness (lower abdomen). There is no guarding.  Genitourinary:  Right perianal fullness with tenderness and a small amount of purulent discharge. Digital rectal exam demonstrates significant tenderness on the right side with some brownish  drainage.  Neurological: She is alert.  Psychiatric: She has a normal mood and affect. Her behavior is normal.    Assessment/Plan: 1. Complex perirectal abscess extending superiorly. She does  not appear septic.  2. Ulcerative colitis status post recent hospitalization for exacerbation. On immunosuppressive therapy.  3. Multiple other comorbidities.  Plan: To the operating room tonight for exam under anesthesia, incision and drainage of complex perirectal abscess. She name may need further surgery later depending on what is found tonight. We discussed the procedure and risks. Risks include but not limited to bleeding, recurrent infection, wound healing problem, anesthesia, need for further surgery. She seems to understand this and agrees with the plan. She has received broad-spectrum antibiotics.  Shaniqwa Horsman J 08/22/2014, 11:01 PM

## 2014-08-22 NOTE — Telephone Encounter (Signed)
Spoke with daughter, Clarisa Fling. She says her mother is weaker than when she came out of the hospital, requiring help in and out of the bathroom. Patient is complaining of rectal pain, that it feels "like boils inside my rectum". She is having up to 12 stools a day. She has a hospital follow up scheduled. Appointment today for evaluation. Declines to go to the ER.

## 2014-08-22 NOTE — ED Provider Notes (Signed)
CSN: [id number]     Arrival date & time 08/22/14  1435 History   First MD Initiated Contact with Patient 08/22/14 1544     Chief Complaint  Patient presents with  . Abdominal Pain  . Rectal Bleeding     (Consider location/radiation/quality/duration/timing/severity/associated sxs/prior Treatment) HPI   Blood pressure 117/81, pulse 93, temperature 98.3 F (36.8 C), temperature source Oral, resp. rate 16, SpO2 96 %.  [name] is a 74 y.o. female complaining of severe rectal pain, not exacerbated by bowel movements onset this morning. She is also having a bloody rectal leakage onset today, she has not had any formed bowel movements today. Patient is taking tramadol home with little relief. Patient was discharged from the hospital yesterday for an ulcerative colitis exacerbation, she had multiple C. difficile test which were all negative. She was scheduled to follow with her gastroenterologist Dr. Deatra Ina this afternoon at 2:30 but she states the pain was so severe so she presented here. Patient denies abdominal pain, nausea, vomiting, fever, chills, history of transfusion.  Patient had scope which showed scant pseudomembranes with largely normal mucosa on last admission.   Past Medical History  Diagnosis Date  . CAD (coronary artery disease)     unspecified  . Cerebrovascular disease   . Hyperlipidemia, mixed   . HTN (hypertension)   . Ulcerative colitis   . Diverticulitis   . Colon polyps 2006  . Thyroid disease   . Myocardial infarct   . Carotid artery occlusion 2010  . Diabetes mellitus without complication     Borderline  . GERD (gastroesophageal reflux disease)   . Arthritis    Past Surgical History  Procedure Laterality Date  . Cholecystectomy  7.06.2008    with cholangiogram  . 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 Abdominal hysterectomy    . Colonoscopy  multiple  . Sigmoidoscopy  multiple  . Flexible sigmoidoscopy N/A 08/08/2014    Procedure: FLEXIBLE  SIGMOIDOSCOPY;  Surgeon: Gatha Mayer, MD;  Location: [facility];  Service: Endoscopy;  Laterality: N/A;   Family History  Problem Relation Age of Onset  . Colitis Brother     undefined  . Stomach cancer Mother   . Stroke Father     died  . Colon cancer Neg Hx    History  Substance Use Topics  . Smoking status: Former Smoker -- 0.30 packs/day for 40 years    Types: Cigarettes    Quit date: 02/17/2013  . Smokeless tobacco: Never Used  . Alcohol Use: No   OB History    No data available     Review of Systems  10 systems reviewed and found to be negative, except as noted in the HPI.   Allergies  Clindamycin/lincomycin and Ivp dye  Home Medications   Prior to Admission medications   Medication Sig Start Date End Date Taking? Authorizing Provider  CRESTOR 40 MG tablet TAKE 1 TABLET AT BEDTIME Patient taking differently: TAKE 40 MG BY MOUTH AT BEDTIME 05/17/14  Yes Lelon Perla, MD  diphenoxylate-atropine (LOMOTIL) 2.5-0.025 MG per tablet Take 2 tablets by mouth 4 (four) times daily -  before meals and at bedtime. 08/21/14  Yes Domenic Polite, MD  mesalamine (LIALDA) 1.2 G EC tablet take 4 tablets by mouth once daily Patient taking differently: Take 4.8 g by mouth daily.  07/31/14  Yes Inda Castle, MD  metoprolol tartrate (LOPRESSOR) 25 MG tablet take 1 tablet by mouth twice a day Patient taking differently:  TAKE 25 MG BY MOUTH TWICE DAILY 05/18/14  Yes Lelon Perla, MD  predniSONE (DELTASONE) 10 MG tablet Take 2 tablets (20 mg total) by mouth 2 (two) times daily. 08/06/14  Yes Milus Banister, MD  saccharomyces boulardii (FLORASTOR) 250 MG capsule Take 1 capsule (250 mg total) by mouth 2 (two) times daily. 08/03/14  Yes Amy S [name], PA-C  hydrochlorothiazide (MICROZIDE) 12.5 MG capsule Take 12.5 mg by mouth daily. 08/16/14   Historical Provider, MD   BP 149/70 mmHg  Pulse 83  Temp(Src) 98.3 F (36.8 C) (Oral)  Resp 14  Ht 5\' 9"  (1.753 m)  Wt 220 lb (99.791  kg)  BMI 32.47 kg/m2  SpO2 98% Physical Exam  Constitutional: She is oriented to person, place, and time. She appears well-developed and well-nourished. No distress.  HENT:  Head: Normocephalic.  Mouth/Throat: Oropharynx is clear and moist.  Eyes: Conjunctivae and EOM are normal. Pupils are equal, round, and reactive to light.  Neck: Normal range of motion.  Cardiovascular: Normal rate, regular rhythm and intact distal pulses.   Pulmonary/Chest: Effort normal and breath sounds normal. No stridor. No respiratory distress. She has no wheezes. She has no rales. She exhibits no tenderness.  Abdominal: Soft. Bowel sounds are normal. She exhibits no distension and no mass. There is no tenderness. There is no rebound and no guarding.  Genitourinary:  Rectal exam is chaperoned by nurse Apolonio Schneiders, no rashes or lesions, patient has slightly darker than normal stool around the rectum. Distention is tender to palpation 2 cm from the anus at 3:00.   Rectal exam performed by attending she feels a boggy this but she thinks is connected to the abscess  Musculoskeletal: Normal range of motion. She exhibits tenderness.  Neurological: She is alert and oriented to person, place, and time.  Psychiatric: She has a normal mood and affect.  Nursing note and vitals reviewed.   ED Course  Procedures (including critical care time) Labs Review Labs Reviewed  CBC WITH DIFFERENTIAL/PLATELET - Abnormal; Notable for the following:    WBC 13.2 (*)    RBC 3.72 (*)    Hemoglobin 11.3 (*)    HCT 34.7 (*)    Neutrophils Relative % 90 (*)    Lymphocytes Relative 6 (*)    Neutro Abs 11.9 (*)    All other components within normal limits  COMPREHENSIVE METABOLIC PANEL - Abnormal; Notable for the following:    Sodium 130 (*)    Chloride 94 (*)    Glucose, Bld 145 (*)    Calcium 7.8 (*)    Total Protein 5.5 (*)    Albumin 2.3 (*)    All other components within normal limits  PROTIME-INR - Abnormal; Notable for the  following:    Prothrombin Time 16.1 (*)    All other components within normal limits  APTT - Abnormal; Notable for the following:    aPTT 23 (*)    All other components within normal limits  POC OCCULT BLOOD, ED - Abnormal; Notable for the following:    Fecal Occult Bld POSITIVE (*)    All other components within normal limits  CULTURE, BLOOD (ROUTINE X 2)  CULTURE, BLOOD (ROUTINE X 2)  ANAEROBIC CULTURE  CULTURE, ROUTINE-ABSCESS  URINALYSIS, ROUTINE W REFLEX MICROSCOPIC (NOT AT [facility])  GLUCOSE, CAPILLARY  I-STAT CG4 LACTIC ACID, ED  SAMPLE TO BLOOD BANK    Imaging Review Ct Abdomen Pelvis W Contrast  08/22/2014   CLINICAL DATA:  Patient with abdominal pain  and rectal bleeding beginning this morning. Patient with recent ulcerative colitis flare up.  EXAM: CT ABDOMEN AND PELVIS WITH CONTRAST  TECHNIQUE: Multidetector CT imaging of the abdomen and pelvis was performed using the standard protocol following bolus administration of intravenous contrast.  CONTRAST:  141mL OMNIPAQUE IOHEXOL 300 MG/ML  SOLN  COMPARISON:  CT 08/20/2006.  FINDINGS: Lower chest: Dependent atelectasis left lower lobe. Normal heart size. Small hiatal hernia.  Hepatobiliary: Liver is normal in size and contour without focal hepatic lesion identified. Patient status post cholecystectomy.  Pancreas: Unremarkable  Spleen: Unremarkable  Adrenals/Urinary Tract: Stable thickening left adrenal gland. 2.2 cm left renal cyst. Additional tiny bilateral lower attenuation renal lesions too small to characterize.  Stomach/Bowel: There is wall thickening at the level of the rectum. Additionally, best demonstrated on the coronal images (image 63; series 5) there is suggestion of probable perforation and adjacent small perirectal abscess. Wall thickening of the distal transverse and descending colon. There is an extensive amount of gas within the right gluteal soft tissues at the level of the rectum extending to the gluteal musculature and  cranially throughout the retroperitoneum.  Vascular/Lymphatic: Extensive calcified atherosclerotic plaque involving the abdominal aorta. Infrarenal abdominal aortic ectasia, 2.2 cm. No retroperitoneal lymphadenopathy.  Other: Catheter is present within the vagina. Contrast demonstrated within the vagina.  Musculoskeletal: Lower lumbar spine degenerative changes. No aggressive or acute appearing osseous lesions. Extensive amount of soft tissue gas within the right gluteal subcutaneous fat extending into the gluteal musculature. Additionally this gas extends cranially along the retroperitoneum. Soft tissue thickening of the right subcutaneous gluteal soft tissues.  IMPRESSION: Extensive soft tissue gas within the right gluteal fat extending into the right gluteal musculature as well as extending cranially throughout the retroperitoneum. Findings are concerning for an aggressive infectious process in the setting of retroperitoneal fasciitis. Potential causative etiology may be distal colitis with perforation and associated abscess formation. Recommend surgical consultation and broad-spectrum antibiotic therapy.  Wall thickening of the distal transverse and descending colon likely secondary to colitis.  Critical Value/emergent results were called by telephone at the time of interpretation on 08/22/2014 at 8:57 pm to [facility] , who verbally acknowledged these results.   Electronically Signed   By: Lovey Newcomer M.D.   On: 08/22/2014 21:09     EKG Interpretation None      MDM   Final diagnoses:  Abscess, perirectal  Fasciitis   Filed Vitals:   08/22/14 2012 08/22/14 2113 08/22/14 2148 08/23/14 0026  BP: 115/66  107/75 149/70  Pulse: 99  93 83  Temp:    98.3 F (36.8 C)  TempSrc:      Resp: 18  18 14   Height:  5\' 9"  (1.753 m)    Weight:  220 lb (99.791 kg)    SpO2: 93%  93% 98%    Medications  piperacillin-tazobactam (ZOSYN) IVPB 3.375 g (0 g Intravenous Stopped 08/22/14 2220)    Followed  by  piperacillin-tazobactam (ZOSYN) IVPB 3.375 g ( Intravenous Automatically Held 08/31/14 2200)  vancomycin (VANCOCIN) 2,000 mg in sodium chloride 0.9 % 500 mL IVPB ( Intravenous MAR Hold 08/22/14 2338)    Followed by  vancomycin (VANCOCIN) IVPB 750 mg/150 ml premix ( Intravenous Automatically Held 08/31/14 2200)  fentaNYL (SUBLIMAZE) injection 25-50 mcg (not administered)  sodium chloride 0.9 % bolus 500 mL (0 mLs Intravenous Stopped 08/22/14 1658)  morphine 4 MG/ML injection 4 mg (4 mg Intravenous Given 08/22/14 1658)  ondansetron (ZOFRAN) injection 4 mg (4 mg  Intravenous Given 08/22/14 1658)  diphenhydrAMINE (BENADRYL) injection 25 mg (25 mg Intravenous Given 08/22/14 1746)  methylPREDNISolone sodium succinate (SOLU-MEDROL) 125 mg/2 mL injection 125 mg (125 mg Intravenous Given 08/22/14 1746)  iohexol (OMNIPAQUE) 300 MG/ML solution 100 mL (100 mLs Intravenous Contrast Given 08/22/14 1940)  sodium chloride 0.9 % bolus 500 mL (0 mLs Intravenous Stopped 08/22/14 2220)  morphine 4 MG/ML injection 4 mg (4 mg Intravenous Given 08/22/14 2153)    [name] is a pleasant 74 y.o. female presenting with severe rectal pain physical exam consistent with perirectal abscess possibly fistula. Patient had colonoscopy on June 21. She was discharged from the hospital yesterday, patient was thought to have ulcerative colitis exacerbation, multiple C. difficile test came back negative. Patient is afebrile today, no abnormalities and her vital signs aside from a intermittent slight tachycardia. Abdominal exam is benign.  Guaiac is negative, she has a mild leukocytosis of 13.2. Would like to obtain CT to further define the perirectal abscess.  This is a shared visit with the attending physician who personally evaluated the patient and agrees with the care plan.    Radiology report from radiologist Dr. Rosana Hoes appreciated: He states that there is extensive soft tissue gas starting in the right gluteal fat and extending into the  retro-peritoneum extending through fascial planes. This is concerning for aggressive infectious process such as necrotizing fasciitis. Blood cultures are drawn, broad-spectrum antibiotics of Vancocin Zosyn initiated. Patient is bolused further and made nothing by mouth.  Case discussed with surgeon Dr. Zella Richer, he will come to evaluate the patient, states he has to cases in front of her he would like to take her to the [facility]. He requests hospitalist admission secondary to patient's multiple medical comorbidities.  Case discussed with Dr. Posey Pronto who accepts admission.       Monico Blitz, PA-C 08/23/14 1607  Charlesetta Shanks, MD 08/24/14 [phone]

## 2014-08-22 NOTE — ED Notes (Signed)
Pt attempted to give urine to [place], [place]. Was unable to do so.

## 2014-08-22 NOTE — Progress Notes (Signed)
ANTIBIOTIC CONSULT NOTE - INITIAL  Pharmacy Consult for Vancomycin / Zosyn Indication: Necrotizing Fasciitis  Allergies  Allergen Reactions  . Clindamycin/Lincomycin Diarrhea  . Ivp Dye [Iodinated Diagnostic Agents] Other (See Comments)    "almost passed out"    Patient Measurements:   Adjusted Body Weight:   Vital Signs: Temp: 98.3 F (36.8 C) (07/05 1504) Temp Source: Oral (07/05 1504) BP: 115/66 mmHg (07/05 2012) Pulse Rate: 99 (07/05 2012) Intake/Output from previous day:   Intake/Output from this shift:    Labs:  Recent Labs  08/20/14 0507 08/21/14 0556 08/22/14 1610  WBC 9.5 10.0 13.2*  HGB 12.6 11.6* 11.3*  PLT 270 228 236  CREATININE 0.84 0.78 0.87   CrCl cannot be calculated (Unknown ideal weight.). No results for input(s): VANCOTROUGH, VANCOPEAK, VANCORANDOM, GENTTROUGH, GENTPEAK, GENTRANDOM, TOBRATROUGH, TOBRAPEAK, TOBRARND, AMIKACINPEAK, AMIKACINTROU, AMIKACIN in the last 72 hours.   Microbiology: Recent Results (from the past 720 hour(s))  Clostridium Difficile by PCR (not at [facility])     Status: None   Collection Time: 08/03/14  4:15 PM  Result Value Ref Range Status   C difficile by pcr Not Detected Not Detected Final    Comment: This test is for use only with liquid or soft stools; performance characteristics of other clinical specimen types have not been established.   This assay was performed by Cepheid GeneXpert(R) PCR. The performance characteristics of this assay have been determined by [employer]. Performance characteristics refer to the analytical performance of the test.   Clostridium Difficile by PCR (not at [facility])     Status: None   Collection Time: 08/10/14  1:11 PM  Result Value Ref Range Status   C difficile by pcr NEGATIVE NEGATIVE Final    Medical History: Past Medical History  Diagnosis Date  . CAD (coronary artery disease)     unspecified  . Cerebrovascular disease   . Hyperlipidemia, mixed   . HTN  (hypertension)   . Ulcerative colitis   . Diverticulitis   . Colon polyps 2006  . Thyroid disease   . Myocardial infarct   . Carotid artery occlusion 2010  . Diabetes mellitus without complication     Borderline  . GERD (gastroesophageal reflux disease)   . Arthritis     Assessment: 71 yoF with hospitalization 6/20-7/4 for ulcerative colitis flare presents one day following discharge with rectal pain and abdominal bleeding . PMHx significant for CAD s/p CABG, CEA.  CT abd/pelvis shows extensive soft tissue gas within the right gluteal fat extending into the gluteal musculature and throughout retroperitoneum.  Findings concerning for aggressive infectious process in setting of retroperitoneal fasciitis. Pharmacy consulted to start vancomycin and zosyn.    7/5: WBC elevated 13.2K (also on steroids).  Afebrile.  HR 99-102.  Lactate WNL.  SCr 0.87, CrCl ~69 ml/min (N62).    7/5 >> Vancomycin  >> 7/5 >> Zosyn >>    7/5 bloodx2: ordered / urine:  / sputum:   Dose changes/levels:  Goal of Therapy:  Vancomycin trough level 15-20 mcg/ml  Eradication of infection  Plan:  Vancomycin 2g IV x 1 now, then 750mg  IV q12h Zosyn 3.375g IV x1 now, then 4 hour infusion q8h F/u VT at Css, cultures, renal fxn, clinical course  Ralene Bathe, PharmD, BCPS 08/22/2014, 9:27 PM  Pager: [phone]

## 2014-08-22 NOTE — Anesthesia Preprocedure Evaluation (Addendum)
Anesthesia Evaluation  Patient identified by MRN, date of birth, ID band Patient awake    Reviewed: Allergy & Precautions, H&P , NPO status , Patient's Chart, lab work & pertinent test results, reviewed documented beta blocker date and time   Airway Mallampati: II  TM Distance: >3 FB Neck ROM: full    Dental  (+) Dental Advisory Given, Edentulous Upper, Edentulous Lower   Pulmonary neg pulmonary ROS, former smoker,  breath sounds clear to auscultation  Pulmonary exam normal       Cardiovascular Exercise Tolerance: Good hypertension, Pt. on home beta blockers and Pt. on medications + CAD, + Past MI and + CABG Normal cardiovascular examRhythm:regular Rate:Normal     Neuro/Psych Carotid artery occlusion.  Cerebrovascular disease negative neurological ROS  negative psych ROS   GI/Hepatic negative GI ROS, Neg liver ROS,   Endo/Other  diabetesBorderline DM  Renal/GU negative Renal ROS  negative genitourinary   Musculoskeletal   Abdominal   Peds  Hematology negative hematology ROS (+)   Anesthesia Other Findings Na 130  Reproductive/Obstetrics negative OB ROS                            Anesthesia Physical Anesthesia Plan  ASA: III and emergent  Anesthesia Plan: General   Post-op Pain Management:    Induction: Intravenous  Airway Management Planned: LMA  Additional Equipment:   Intra-op Plan:   Post-operative Plan:   Informed Consent: I have reviewed the patients History and Physical, chart, labs and discussed the procedure including the risks, benefits and alternatives for the proposed anesthesia with the patient or authorized representative who has indicated his/her understanding and acceptance.   Dental Advisory Given  Plan Discussed with: CRNA and Surgeon  Anesthesia Plan Comments:        Anesthesia Quick Evaluation

## 2014-08-22 NOTE — ED Notes (Signed)
Bed: WA21 Expected date:  Expected time:  Means of arrival:  Comments: Hall D 

## 2014-08-22 NOTE — ED Notes (Signed)
Pt tried for UA only passed a small amount of stool

## 2014-08-22 NOTE — ED Notes (Signed)
I attempted to collect labs twice and was unsuccessful.  I made nurse aware.

## 2014-08-23 ENCOUNTER — Ambulatory Visit: Payer: BC Managed Care – PPO | Admitting: Nurse Practitioner

## 2014-08-23 ENCOUNTER — Encounter (HOSPITAL_COMMUNITY): Payer: Self-pay | Admitting: General Surgery

## 2014-08-23 DIAGNOSIS — D62 Acute posthemorrhagic anemia: Secondary | ICD-10-CM

## 2014-08-23 DIAGNOSIS — D72829 Elevated white blood cell count, unspecified: Secondary | ICD-10-CM

## 2014-08-23 DIAGNOSIS — A419 Sepsis, unspecified organism: Principal | ICD-10-CM

## 2014-08-23 LAB — CBC
HCT: 31.2 % — ABNORMAL LOW (ref 36.0–46.0)
Hemoglobin: 10.2 g/dL — ABNORMAL LOW (ref 12.0–15.0)
MCH: 30.8 pg (ref 26.0–34.0)
MCHC: 32.7 g/dL (ref 30.0–36.0)
MCV: 94.3 fL (ref 78.0–100.0)
PLATELETS: 217 10*3/uL (ref 150–400)
RBC: 3.31 MIL/uL — ABNORMAL LOW (ref 3.87–5.11)
RDW: 14.3 % (ref 11.5–15.5)
WBC: 14.7 10*3/uL — ABNORMAL HIGH (ref 4.0–10.5)

## 2014-08-23 LAB — COMPREHENSIVE METABOLIC PANEL
ALT: 13 U/L — AB (ref 14–54)
AST: 15 U/L (ref 15–41)
Albumin: 2 g/dL — ABNORMAL LOW (ref 3.5–5.0)
Alkaline Phosphatase: 46 U/L (ref 38–126)
Anion gap: 7 (ref 5–15)
BILIRUBIN TOTAL: 0.7 mg/dL (ref 0.3–1.2)
BUN: 12 mg/dL (ref 6–20)
CALCIUM: 7 mg/dL — AB (ref 8.9–10.3)
CHLORIDE: 98 mmol/L — AB (ref 101–111)
CO2: 25 mmol/L (ref 22–32)
CREATININE: 0.8 mg/dL (ref 0.44–1.00)
GFR calc Af Amer: >60 mL/min (ref >60)
GLUCOSE: 153 mg/dL — AB (ref 65–99)
Potassium: 4.2 mmol/L (ref 3.5–5.1)
Sodium: 130 mmol/L — ABNORMAL LOW (ref 135–145)
Total Protein: 4.9 g/dL — ABNORMAL LOW (ref 6.5–8.1)

## 2014-08-23 LAB — URINE MICROSCOPIC-ADD ON

## 2014-08-23 LAB — URINALYSIS, ROUTINE W REFLEX MICROSCOPIC
Glucose, UA: NEGATIVE mg/dL
HGB URINE DIPSTICK: NEGATIVE
Ketones, ur: 15 mg/dL — AB
LEUKOCYTES UA: NEGATIVE
Nitrite: NEGATIVE
PH: 6 (ref 5.0–8.0)
PROTEIN: 30 mg/dL — AB
Urobilinogen, UA: 0.2 mg/dL (ref 0.0–1.0)

## 2014-08-23 LAB — PROTIME-INR
INR: 1.34 (ref 0.00–1.49)
PROTHROMBIN TIME: 16.7 s — AB (ref 11.6–15.2)

## 2014-08-23 LAB — HEMOGLOBIN AND HEMATOCRIT, BLOOD
HCT: 30.5 % — ABNORMAL LOW (ref 36.0–46.0)
Hemoglobin: 10.1 g/dL — ABNORMAL LOW (ref 12.0–15.0)

## 2014-08-23 LAB — GLUCOSE, CAPILLARY: Glucose-Capillary: 143 mg/dL — ABNORMAL HIGH (ref 65–99)

## 2014-08-23 LAB — MRSA PCR SCREENING: MRSA by PCR: NEGATIVE

## 2014-08-23 MED ORDER — MORPHINE SULFATE 2 MG/ML IJ SOLN
2.0000 mg | INTRAMUSCULAR | Status: DC | PRN
  Administered 2014-08-24 (×2): 2 mg via INTRAVENOUS
  Administered 2014-08-24: 4 mg via INTRAVENOUS
  Administered 2014-08-24 – 2014-08-25 (×2): 2 mg via INTRAVENOUS
  Administered 2014-08-25: 6 mg via INTRAVENOUS
  Administered 2014-08-26: 2 mg via INTRAVENOUS
  Administered 2014-08-26 – 2014-08-27 (×3): 4 mg via INTRAVENOUS
  Administered 2014-08-27 – 2014-08-28 (×2): 2 mg via INTRAVENOUS
  Administered 2014-08-28 – 2014-08-29 (×2): 4 mg via INTRAVENOUS
  Administered 2014-08-29 – 2014-08-30 (×5): 2 mg via INTRAVENOUS
  Administered 2014-08-31: 4 mg via INTRAVENOUS
  Administered 2014-08-31: 2 mg via INTRAVENOUS
  Administered 2014-08-31: 6 mg via INTRAVENOUS
  Administered 2014-08-31: 2 mg via INTRAVENOUS
  Administered 2014-09-01 (×2): 4 mg via INTRAVENOUS
  Administered 2014-09-01 – 2014-09-02 (×3): 6 mg via INTRAVENOUS
  Administered 2014-09-03: 4 mg via INTRAVENOUS
  Administered 2014-09-03 – 2014-09-05 (×8): 6 mg via INTRAVENOUS
  Administered 2014-09-05 – 2014-09-06 (×3): 4 mg via INTRAVENOUS
  Administered 2014-09-06: 2 mg via INTRAVENOUS
  Administered 2014-09-06: 6 mg via INTRAVENOUS
  Administered 2014-09-07 (×2): 4 mg via INTRAVENOUS
  Administered 2014-09-08: 6 mg via INTRAVENOUS
  Administered 2014-09-08: 2 mg via INTRAVENOUS
  Administered 2014-09-08 – 2014-09-10 (×8): 4 mg via INTRAVENOUS
  Administered 2014-09-10: 2 mg via INTRAVENOUS
  Administered 2014-09-11: 4 mg via INTRAVENOUS
  Administered 2014-09-11 (×2): 6 mg via INTRAVENOUS
  Administered 2014-09-11 (×2): 4 mg via INTRAVENOUS
  Administered 2014-09-12: 6 mg via INTRAVENOUS
  Administered 2014-09-12 (×3): 4 mg via INTRAVENOUS
  Administered 2014-09-13: 2 mg via INTRAVENOUS
  Administered 2014-09-13 (×3): 4 mg via INTRAVENOUS
  Administered 2014-09-14: 6 mg via INTRAVENOUS
  Administered 2014-09-14: 4 mg via INTRAVENOUS
  Administered 2014-09-14: 6 mg via INTRAVENOUS
  Administered 2014-09-14: 4 mg via INTRAVENOUS
  Administered 2014-09-14: 6 mg via INTRAVENOUS
  Administered 2014-09-15 – 2014-09-16 (×6): 4 mg via INTRAVENOUS
  Administered 2014-09-18: 2 mg via INTRAVENOUS
  Administered 2014-09-18: 6 mg via INTRAVENOUS
  Administered 2014-09-18: 4 mg via INTRAVENOUS
  Administered 2014-09-19 – 2014-09-20 (×7): 6 mg via INTRAVENOUS
  Filled 2014-08-23 (×3): qty 3
  Filled 2014-08-23 (×2): qty 2
  Filled 2014-08-23: qty 1
  Filled 2014-08-23: qty 2
  Filled 2014-08-23 (×2): qty 1
  Filled 2014-08-23 (×2): qty 2
  Filled 2014-08-23: qty 3
  Filled 2014-08-23 (×3): qty 2
  Filled 2014-08-23: qty 3
  Filled 2014-08-23: qty 2
  Filled 2014-08-23: qty 1
  Filled 2014-08-23: qty 2
  Filled 2014-08-23: qty 3
  Filled 2014-08-23: qty 1
  Filled 2014-08-23 (×2): qty 3
  Filled 2014-08-23: qty 1
  Filled 2014-08-23: qty 3
  Filled 2014-08-23: qty 1
  Filled 2014-08-23: qty 3
  Filled 2014-08-23: qty 1
  Filled 2014-08-23: qty 2
  Filled 2014-08-23 (×2): qty 3
  Filled 2014-08-23 (×5): qty 2
  Filled 2014-08-23: qty 3
  Filled 2014-08-23 (×4): qty 2
  Filled 2014-08-23 (×3): qty 3
  Filled 2014-08-23: qty 1
  Filled 2014-08-23: qty 2
  Filled 2014-08-23 (×2): qty 3
  Filled 2014-08-23: qty 2
  Filled 2014-08-23: qty 3
  Filled 2014-08-23 (×2): qty 2
  Filled 2014-08-23: qty 3
  Filled 2014-08-23 (×5): qty 1
  Filled 2014-08-23 (×2): qty 2
  Filled 2014-08-23: qty 1
  Filled 2014-08-23 (×3): qty 2
  Filled 2014-08-23: qty 3
  Filled 2014-08-23 (×7): qty 2
  Filled 2014-08-23: qty 1
  Filled 2014-08-23: qty 2
  Filled 2014-08-23 (×2): qty 1
  Filled 2014-08-23: qty 2
  Filled 2014-08-23: qty 3
  Filled 2014-08-23 (×3): qty 2
  Filled 2014-08-23 (×2): qty 3
  Filled 2014-08-23: qty 2
  Filled 2014-08-23: qty 1
  Filled 2014-08-23: qty 3
  Filled 2014-08-23 (×3): qty 2
  Filled 2014-08-23: qty 3
  Filled 2014-08-23 (×3): qty 2
  Filled 2014-08-23: qty 3
  Filled 2014-08-23 (×2): qty 1
  Filled 2014-08-23: qty 3

## 2014-08-23 MED ORDER — PANTOPRAZOLE SODIUM 40 MG IV SOLR
40.0000 mg | Freq: Two times a day (BID) | INTRAVENOUS | Status: DC
  Administered 2014-08-23 – 2014-08-26 (×6): 40 mg via INTRAVENOUS
  Filled 2014-08-23 (×7): qty 40

## 2014-08-23 MED ORDER — SODIUM CHLORIDE 0.9 % IV SOLN
INTRAVENOUS | Status: DC
  Administered 2014-08-23 – 2014-08-26 (×3): via INTRAVENOUS

## 2014-08-23 MED ORDER — HEPARIN SODIUM (PORCINE) 5000 UNIT/ML IJ SOLN: 5000.0000 [IU] | Freq: Three times a day (TID) | INTRAMUSCULAR | Status: DC

## 2014-08-23 MED ORDER — ACETAMINOPHEN 325 MG PO TABS: 650.0000 mg | ORAL_TABLET | Freq: Four times a day (QID) | ORAL | Status: DC | PRN

## 2014-08-23 MED ORDER — ONDANSETRON HCL 4 MG/2ML IJ SOLN
4.0000 mg | Freq: Four times a day (QID) | INTRAMUSCULAR | Status: DC | PRN
  Administered 2014-08-24 – 2014-09-13 (×4): 4 mg via INTRAVENOUS

## 2014-08-23 MED ORDER — ACETAMINOPHEN 650 MG RE SUPP: 650.0000 mg | Freq: Four times a day (QID) | RECTAL | Status: DC | PRN

## 2014-08-23 MED ORDER — 0.9 % SODIUM CHLORIDE (POUR BTL) OPTIME
TOPICAL | Status: DC | PRN
  Administered 2014-08-23: 1000 mL

## 2014-08-23 MED ORDER — FENTANYL CITRATE (PF) 100 MCG/2ML IJ SOLN: 25.0000 ug | INTRAMUSCULAR | Status: DC | PRN

## 2014-08-23 MED ORDER — SODIUM CHLORIDE 0.9 % IV SOLN
INTRAVENOUS | Status: DC | PRN
  Administered 2014-08-22: via INTRAVENOUS

## 2014-08-23 MED ORDER — ONDANSETRON HCL 4 MG/2ML IJ SOLN
4.0000 mg | INTRAMUSCULAR | Status: DC | PRN
  Filled 2014-08-23 (×4): qty 2

## 2014-08-23 MED ORDER — ONDANSETRON HCL 4 MG PO TABS: 4.0000 mg | ORAL_TABLET | Freq: Four times a day (QID) | ORAL | Status: DC | PRN

## 2014-08-23 MED ORDER — SODIUM CHLORIDE 0.9 % IJ SOLN
3.0000 mL | Freq: Two times a day (BID) | INTRAMUSCULAR | Status: DC
  Administered 2014-08-23 – 2014-09-17 (×12): 3 mL via INTRAVENOUS

## 2014-08-23 NOTE — Anesthesia Postprocedure Evaluation (Signed)
  Anesthesia Post-op Note  Patient: [name]  Procedure(s) Performed: Procedure(s) (LRB): IRRIGATION AND DEBRIDEMENT PERIRECTAL ABSCESS (N/A)  Patient Location: PACU  Anesthesia Type: General  Level of Consciousness: awake and alert   Airway and Oxygen Therapy: Patient Spontanous Breathing  Post-op Pain: mild  Post-op Assessment: Post-op Vital signs reviewed, Patient's Cardiovascular Status Stable, Respiratory Function Stable, Patent Airway and No signs of Nausea or vomiting  Last Vitals:  Filed Vitals:   08/23/14 0026  BP: 149/70  Pulse: 83  Temp: 36.8 C  Resp: 14    Post-op Vital Signs: stable   Complications: No apparent anesthesia complications

## 2014-08-23 NOTE — Progress Notes (Signed)
Patient ID: [name], female   DOB: December 18, 1940, 74 y.o.   MRN: [id number] [facility] PROGRESS NOTE  [name] LDJ:[id number] DOB: 07-17-1940 DOA: 08/22/2014 PCP: Kandice Hams, MD  Brief narrative:    74 y.o. female with past medical history of dyslipidemia, hypertension, ulcerative colitis and recently hospitalized from 08/07/2014 through 08/21/2014 for ulcerative colitis flare who presented to [facility] with perianal pain started the night prior to the admission. CT scan on the admission demonstrated a complex perirectal infection extending up toward the retroperitoneal area. On her recent admission she had flexible sigmoidoscopy 08/08/2014 which demonstrated inflammatory changes from the rectum consistent with ulcerative colitis. Patient is status post incision and drainage of the large issue rectal abscess on the right side. She was started on vancomycin and Zosyn. Postprocedure she was doing fine but has developed large amount of bleed this morning.  Hemoglobin remained stable at 10.1 on repeat CBC today.   Assessment/Plan:    Principal Problem:   Sepsis secondary to peri-rectal abscess / leukocytosis  - Sepsis criteria met on the admission with tachycardia, tachypnea, hypotension, leukocytosis. Source of infection thought to be perirectal abscess. - Patient is status post incision and drainage done earlier this morning 08/23/2014 by surgery. - Postprocedure, she was doing fine but then developed bleed earlier this morning. - She is on vancomycin and Zosyn - Cultures of the rectal abscess are pending - While patient is hemodynamically stable she will be monitored in step down unit for next 24 hours because of rectal bleed.  Active Problems: Acute blood loss anemia - Likely postprocedure from incision and drainage done earlier this morning 08/23/2014 - Hemoglobin this morning was 10.2 and repeat level was 10.1. - Continue to monitor daily CBC    Essential  hypertension - Because she was hypotensive on admission her blood pressure medications are on hold. - Current blood pressure stable without blood pressure medications. - Continue to monitor on telemetry    Ulcerative colitis - Recently admitted and treated for ulcerative colitis flareup. - Other than rectal abscess there is no complaint of abdominal pain at this point. No diarrhea. - Has had recent flexible sigmoidoscopy 08/08/2014 with findings consistent with ulcerative colitis.    Hyponatremia - Likely related to acute infection, sepsis - Continue IV fluids - Follow-up BMP tomorrow morning    DVT Prophylaxis  - SCD's bilaterally due to risk of bleeding   Code Status: Full.  Family Communication:  plan of care discussed with the patient Disposition Plan: Remain in step down unit because of bleeding  IV access:  Peripheral IV  Procedures and diagnostic studies:    Ct Abdomen Pelvis W Contrast 08/22/2014  Extensive soft tissue gas within the right gluteal fat extending into the right gluteal musculature as well as extending cranially throughout the retroperitoneum. Findings are concerning for an aggressive infectious process in the setting of retroperitoneal fasciitis. Potential causative etiology may be distal colitis with perforation and associated abscess formation. Recommend surgical consultation and broad-spectrum antibiotic therapy.  Wall thickening of the distal transverse and descending colon likely secondary to colitis.    Medical Consultants:  Surgery, Dr. Fanny Skates   Other Consultants:  None   IAnti-Infectives:   Vancomycin 08/22/2014 --> Zosyn 08/22/2014 -->   Leisa Lenz, MD  [facility] Pager [phone]  Time spent in minutes: 25 minutes  If 7PM-7AM, please contact night-coverage [email or web address] Password [email or web address] 08/23/2014, 11:36 AM   LOS: 1 day    HPI/Subjective: No acute overnight  events. Patient reports bleeding from rectum.    Objective: Filed Vitals:   08/23/14 0800 08/23/14 0900 08/23/14 0904 08/23/14 1000  BP: 131/72     Pulse: 74 85 88 76  Temp: 97.6 F (36.4 C)     TempSrc: Oral     Resp: 16 17 19 16   Height:      Weight:      SpO2: 97% 98% 98% 98%    Intake/Output Summary (Last 24 hours) at 08/23/14 1136 Last data filed at 08/23/14 0602  Gross per 24 hour  Intake   1650 ml  Output    800 ml  Net    850 ml    Exam:   General:  Pt is alert, follows commands appropriately, not in acute distress  Cardiovascular: Regular rate and rhythm, S1/S2 (+)  Respiratory: Clear to auscultation bilaterally, no wheezing, no crackles, no rhonchi  Abdomen: Soft, non tender, non distended, bowel sounds present  Extremities: No edema, pulses DP and PT palpable bilaterally  Neuro: Grossly nonfocal  Data Reviewed: Basic Metabolic Panel:  Recent Labs Lab 08/18/14 0414 08/20/14 0507 08/21/14 0556 08/22/14 1610 08/23/14 0345  NA 133* 131* 133* 130* 130*  K 4.9 4.9 4.9 4.4 4.2  CL 93* 91* 94* 94* 98*  CO2 32 32 33* 29 25  GLUCOSE 163* 144* 134* 145* 153*  BUN 11 8 8 11 12   CREATININE 0.87 0.84 0.78 0.87 0.80  CALCIUM 8.0* 8.2* 8.1* 7.8* 7.0*   Liver Function Tests:  Recent Labs Lab 08/22/14 1610 08/23/14 0345  AST 18 15  ALT 14 13*  ALKPHOS 57 46  BILITOT 0.8 0.7  PROT 5.5* 4.9*  ALBUMIN 2.3* 2.0*   No results for input(s): LIPASE, AMYLASE in the last 168 hours. No results for input(s): AMMONIA in the last 168 hours. CBC:  Recent Labs Lab 08/18/14 0414 08/20/14 0507 08/21/14 0556 08/22/14 1610 08/23/14 0345 08/23/14 1000  WBC 11.3* 9.5 10.0 13.2* 14.7*  --   NEUTROABS  --   --   --  11.9*  --   --   HGB 12.8 12.6 11.6* 11.3* 10.2* 10.1*  HCT 38.7 38.3 35.7* 34.7* 31.2* 30.5*  MCV 92.8 92.5 93.5 93.3 94.3  --   PLT 251 270 228 236 217  --    Cardiac Enzymes: No results for input(s): CKTOTAL, CKMB, CKMBINDEX, TROPONINI in the last 168 hours. BNP: Invalid input(s):  POCBNP CBG:  Recent Labs Lab 08/23/14 0045  GLUCAP 143*    Anaerobic culture     Status: None (Preliminary result)   Collection Time: 08/23/14 12:03 AM  Result Value Ref Range Status   Specimen Description ABSCESS PERIRECTAL  Final   Special Requests PATIENT ON FOLLOWING ZOYSN, VANC  Final   Gram Stain   Final    NO WBC SEEN NO SQUAMOUS EPITHELIAL CELLS SEEN MODERATE GRAM NEGATIVE RODS FEW YEAST RARE GRAM POSITIVE COCCI    Culture PENDING  Incomplete   Report Status PENDING  Incomplete  Culture, routine-abscess     Status: None (Preliminary result)   Collection Time: 08/23/14 12:03 AM  Result Value Ref Range Status   Specimen Description ABSCESS PERIRECTAL  Final   Special Requests PATIENT ON FOLLOWING ZOYSN, VANC  Final   Gram Stain   Final    NO WBC SEEN NO SQUAMOUS EPITHELIAL CELLS SEEN MODERATE GRAM NEGATIVE RODS RARE YEAST RARE GRAM POSITIVE COCCI    Culture PENDING  Incomplete   Report Status PENDING  Incomplete  MRSA  PCR Screening     Status: None   Collection Time: 08/23/14  1:32 AM  Result Value Ref Range Status   MRSA by PCR NEGATIVE NEGATIVE Final     Scheduled Meds: . piperacillin-tazobactam (ZOSYN)  IV  3.375 g Intravenous 3 times per day  . sodium chloride  3 mL Intravenous Q12H  . vancomycin  750 mg Intravenous Q12H   Continuous Infusions: . sodium chloride 75 mL/hr at 08/23/14 0604

## 2014-08-23 NOTE — Progress Notes (Signed)
Date:  August 23, 2014 U.R. performed for needs and level of care. Will continue to follow for Case Management needs.  Velva Harman, RN, BSN, [place]   [phone]

## 2014-08-23 NOTE — Transfer of Care (Signed)
Immediate Anesthesia Transfer of Care Note  Patient: [name]  Procedure(s) Performed: Procedure(s): IRRIGATION AND DEBRIDEMENT PERIRECTAL ABSCESS (N/A)  Patient Location: PACU  Anesthesia Type:General  Level of Consciousness: awake, oriented, patient cooperative, lethargic and responds to stimulation  Airway & Oxygen Therapy: Patient Spontanous Breathing and Patient connected to face mask oxygen  Post-op Assessment: Report given to RN, Post -op Vital signs reviewed and stable and Patient moving all extremities  Post vital signs: Reviewed and stable  Last Vitals:  Filed Vitals:   08/22/14 2148  BP: 107/75  Pulse: 93  Temp:   Resp: 18    Complications: No apparent anesthesia complications

## 2014-08-23 NOTE — Progress Notes (Addendum)
Patient felt like she was having a lot of gas and needed to have a BM. Patient had a large amount of bright red blood and blood clots from her rectum and penrose drain. Dr. Charlies Silvers was at bedside and saw the amount of blood noted on the bedpad, in the mesh panties, and in the bedside commode. It was unable to be measured but would be estimated at 350-400 cc of bright red blood and clots. She asked that Dr. Dalbert Batman be notified. Patient denies being light headed or dizzy at this time. Vitals all WNL. Dr. Dalbert Batman reported that this is related to her ulcerative colitis and that [facility] should manage medically. A H&H was ordered for now and patient to have a CBC and BMET in am. Dr. Charlies Silvers notified.

## 2014-08-23 NOTE — Progress Notes (Addendum)
1 Day Post-Op  Subjective: Alert.  Pleasant.  Cooperative. Admission history, exam, and operative events reviewed. Large right-sided issue rectal abscess drained yesterday evening with Penrose drains sutured in place. On Zosyn and vancomycin. On high-dose stairways for Ulcerative colitis. She  states that her perirectal pain is much better better.  Denies abdominal pain. No apparent bleeding overnight.  Hemoglobin 10.2.  WBC 14.7.  Potassium 4.2.  Creatinine 0.8.  Glucose 153.  Objective: Vital signs in last 24 hours: Temp:  [98.2 F (36.8 C)-98.5 F (36.9 C)] 98.5 F (36.9 C) (07/06 0434) Pulse Rate:  [72-102] 72 (07/06 0400) Resp:  [11-23] 15 (07/06 0400) BP: (97-149)/(48-81) 97/48 mmHg (07/06 0400) SpO2:  [92 %-100 %] 97 % (07/06 0400) Weight:  [99.791 kg (220 lb)-104 kg (229 lb 4.5 oz)] 104 kg (229 lb 4.5 oz) (07/06 0434) Last BM Date: 08/20/14 (per patient)  Intake/Output from previous day: 07/05 0701 - 07/06 0700 In: 1650 [I.V.:1600; IV Piggyback:50] Out: 800 [Urine:800] Intake/Output this shift: Total I/O In: 1650 [I.V.:1600; IV Piggyback:50] Out: 800 [Urine:800]   EXAM General appearance: Alert.  Pleasant.  No distress whatsoever.  Mental status normal. GI: Abdomen basically soft.  Nondistended.  Nontender.  A little bit of discomfort to palpate right lower quadrant. Incision/Wound: Right-sided perirectal incision looks good.  Penrose in place.  Tissues are soft.  Serosanguineous drainage.  Doesn't look like fasciitis.  Lab Results:  Results for orders placed or performed during the hospital encounter of 08/22/14 (from the past 24 hour(s))  CBC with Differential     Status: Abnormal   Collection Time: 08/22/14  4:10 PM  Result Value Ref Range   WBC 13.2 (H) 4.0 - 10.5 K/uL   RBC 3.72 (L) 3.87 - 5.11 MIL/uL   Hemoglobin 11.3 (L) 12.0 - 15.0 g/dL   HCT 34.7 (L) 36.0 - 46.0 %   MCV 93.3 78.0 - 100.0 fL   MCH 30.4 26.0 - 34.0 pg   MCHC 32.6 30.0 - 36.0 g/dL   RDW 14.1 11.5 - 15.5 %   Platelets 236 150 - 400 K/uL   Neutrophils Relative % 90 (H) 43 - 77 %   Lymphocytes Relative 6 (L) 12 - 46 %   Monocytes Relative 4 3 - 12 %   Eosinophils Relative 0 0 - 5 %   Basophils Relative 0 0 - 1 %   Neutro Abs 11.9 (H) 1.7 - 7.7 K/uL   Lymphs Abs 0.8 0.7 - 4.0 K/uL   Monocytes Absolute 0.5 0.1 - 1.0 K/uL   Eosinophils Absolute 0.0 0.0 - 0.7 K/uL   Basophils Absolute 0.0 0.0 - 0.1 K/uL   WBC Morphology INCREASED BANDS (>20% BANDS)   Comprehensive metabolic panel     Status: Abnormal   Collection Time: 08/22/14  4:10 PM  Result Value Ref Range   Sodium 130 (L) 135 - 145 mmol/L   Potassium 4.4 3.5 - 5.1 mmol/L   Chloride 94 (L) 101 - 111 mmol/L   CO2 29 22 - 32 mmol/L   Glucose, Bld 145 (H) 65 - 99 mg/dL   BUN 11 6 - 20 mg/dL   Creatinine, Ser 0.87 0.44 - 1.00 mg/dL   Calcium 7.8 (L) 8.9 - 10.3 mg/dL   Total Protein 5.5 (L) 6.5 - 8.1 g/dL   Albumin 2.3 (L) 3.5 - 5.0 g/dL   AST 18 15 - 41 U/L   ALT 14 14 - 54 U/L   Alkaline Phosphatase 57 38 - 126 U/L  Total Bilirubin 0.8 0.3 - 1.2 mg/dL   GFR calc non Af Amer >60 >60 mL/min   GFR calc Af Amer >60 >60 mL/min   Anion gap 7 5 - 15  Sample to Blood Bank     Status: None   Collection Time: 08/22/14  4:10 PM  Result Value Ref Range   Blood Bank Specimen SAMPLE AVAILABLE FOR TESTING    Sample Expiration 08/25/2014   Protime-INR     Status: Abnormal   Collection Time: 08/22/14  4:10 PM  Result Value Ref Range   Prothrombin Time 16.1 (H) 11.6 - 15.2 seconds   INR 1.28 0.00 - 1.49  APTT     Status: Abnormal   Collection Time: 08/22/14  4:10 PM  Result Value Ref Range   aPTT 23 (L) 24 - 37 seconds  POC occult blood, ED Provider will collect     Status: Abnormal   Collection Time: 08/22/14  4:17 PM  Result Value Ref Range   Fecal Occult Bld POSITIVE (A) NEGATIVE  I-Stat CG4 Lactic Acid, ED     Status: None   Collection Time: 08/22/14  4:22 PM  Result Value Ref Range   Lactic Acid, Venous 1.36  0.5 - 2.0 mmol/L  Glucose, capillary     Status: Abnormal   Collection Time: 08/23/14 12:45 AM  Result Value Ref Range   Glucose-Capillary 143 (H) 65 - 99 mg/dL  MRSA PCR Screening     Status: None   Collection Time: 08/23/14  1:32 AM  Result Value Ref Range   MRSA by PCR NEGATIVE NEGATIVE  Comprehensive metabolic panel     Status: Abnormal   Collection Time: 08/23/14  3:45 AM  Result Value Ref Range   Sodium 130 (L) 135 - 145 mmol/L   Potassium 4.2 3.5 - 5.1 mmol/L   Chloride 98 (L) 101 - 111 mmol/L   CO2 25 22 - 32 mmol/L   Glucose, Bld 153 (H) 65 - 99 mg/dL   BUN 12 6 - 20 mg/dL   Creatinine, Ser 0.80 0.44 - 1.00 mg/dL   Calcium 7.0 (L) 8.9 - 10.3 mg/dL   Total Protein 4.9 (L) 6.5 - 8.1 g/dL   Albumin 2.0 (L) 3.5 - 5.0 g/dL   AST 15 15 - 41 U/L   ALT 13 (L) 14 - 54 U/L   Alkaline Phosphatase 46 38 - 126 U/L   Total Bilirubin 0.7 0.3 - 1.2 mg/dL   GFR calc non Af Amer >60 >60 mL/min   GFR calc Af Amer >60 >60 mL/min   Anion gap 7 5 - 15  CBC     Status: Abnormal   Collection Time: 08/23/14  3:45 AM  Result Value Ref Range   WBC 14.7 (H) 4.0 - 10.5 K/uL   RBC 3.31 (L) 3.87 - 5.11 MIL/uL   Hemoglobin 10.2 (L) 12.0 - 15.0 g/dL   HCT 31.2 (L) 36.0 - 46.0 %   MCV 94.3 78.0 - 100.0 fL   MCH 30.8 26.0 - 34.0 pg   MCHC 32.7 30.0 - 36.0 g/dL   RDW 14.3 11.5 - 15.5 %   Platelets 217 150 - 400 K/uL  Protime-INR     Status: Abnormal   Collection Time: 08/23/14  3:45 AM  Result Value Ref Range   Prothrombin Time 16.7 (H) 11.6 - 15.2 seconds   INR 1.34 0.00 - 1.49     Studies/Results: Ct Abdomen Pelvis W Contrast  08/22/2014   CLINICAL DATA:  Patient  with abdominal pain and rectal bleeding beginning this morning. Patient with recent ulcerative colitis flare up.  EXAM: CT ABDOMEN AND PELVIS WITH CONTRAST  TECHNIQUE: Multidetector CT imaging of the abdomen and pelvis was performed using the standard protocol following bolus administration of intravenous contrast.  CONTRAST:  11mL  OMNIPAQUE IOHEXOL 300 MG/ML  SOLN  COMPARISON:  CT 08/20/2006.  FINDINGS: Lower chest: Dependent atelectasis left lower lobe. Normal heart size. Small hiatal hernia.  Hepatobiliary: Liver is normal in size and contour without focal hepatic lesion identified. Patient status post cholecystectomy.  Pancreas: Unremarkable  Spleen: Unremarkable  Adrenals/Urinary Tract: Stable thickening left adrenal gland. 2.2 cm left renal cyst. Additional tiny bilateral lower attenuation renal lesions too small to characterize.  Stomach/Bowel: There is wall thickening at the level of the rectum. Additionally, best demonstrated on the coronal images (image 63; series 5) there is suggestion of probable perforation and adjacent small perirectal abscess. Wall thickening of the distal transverse and descending colon. There is an extensive amount of gas within the right gluteal soft tissues at the level of the rectum extending to the gluteal musculature and cranially throughout the retroperitoneum.  Vascular/Lymphatic: Extensive calcified atherosclerotic plaque involving the abdominal aorta. Infrarenal abdominal aortic ectasia, 2.2 cm. No retroperitoneal lymphadenopathy.  Other: Catheter is present within the vagina. Contrast demonstrated within the vagina.  Musculoskeletal: Lower lumbar spine degenerative changes. No aggressive or acute appearing osseous lesions. Extensive amount of soft tissue gas within the right gluteal subcutaneous fat extending into the gluteal musculature. Additionally this gas extends cranially along the retroperitoneum. Soft tissue thickening of the right subcutaneous gluteal soft tissues.  IMPRESSION: Extensive soft tissue gas within the right gluteal fat extending into the right gluteal musculature as well as extending cranially throughout the retroperitoneum. Findings are concerning for an aggressive infectious process in the setting of retroperitoneal fasciitis. Potential causative etiology may be distal colitis  with perforation and associated abscess formation. Recommend surgical consultation and broad-spectrum antibiotic therapy.  Wall thickening of the distal transverse and descending colon likely secondary to colitis.  Critical Value/emergent results were called by telephone at the time of interpretation on 08/22/2014 at 8:57 pm to [facility] , who verbally acknowledged these results.   Electronically Signed   By: Lovey Newcomer M.D.   On: 08/22/2014 21:09    . heparin  5,000 Units Subcutaneous 3 times per day  . piperacillin-tazobactam (ZOSYN)  IV  3.375 g Intravenous 3 times per day  . sodium chloride  3 mL Intravenous Q12H  . vancomycin  750 mg Intravenous Q12H     Assessment/Plan: s/p Procedure(s): IRRIGATION AND DEBRIDEMENT PERIRECTAL ABSCESS  POD #1.  Incision and drainage large ischio rectal abscess, right-sided Continue broad-spectrum antibiotic and  wound care Mobilize out of bed if possible  Ulcerative colitis.  Recent hospitalization for flareup. Unfortunately, currently on high-dose steroids.  This will have negative at affect on wound healing and infection control. Would advocate  steroid taper, if medically indicated.  Coronary artery disease, status post CABG,  History myocardial infarction. History CVA Hypertension GERD  @PROBHOSP @  LOS: 1 day    Azul Brumett M 08/23/2014  . .prob

## 2014-08-23 NOTE — Progress Notes (Signed)
Patient had another episode of bright red blood and blood clots coming from the rectum and penrose drains. Patient denies dizziness or lightheadedness at this time. VS WNL. Dr. Charlies Silvers notified. This episode occurred right after patient ate clear liquids for lunch.

## 2014-08-23 NOTE — Anesthesia Procedure Notes (Signed)
Procedure Name: LMA Insertion Date/Time: 08/22/2014 11:43 PM Performed by: Ofilia Neas Pre-anesthesia Checklist: Patient identified, Emergency Drugs available, Suction available, Patient being monitored and Timeout performed Patient Re-evaluated:Patient Re-evaluated prior to inductionOxygen Delivery Method: Circle system utilized Preoxygenation: Pre-oxygenation with 100% oxygen Intubation Type: IV induction LMA: LMA with gastric port inserted LMA Size: 4.0 Number of attempts: 1 Placement Confirmation: positive ETCO2 Tube secured with: Tape Dental Injury: Teeth and Oropharynx as per pre-operative assessment

## 2014-08-23 NOTE — Care Management Note (Signed)
Case Management Note  Patient Details  Name: [name] MRN: [id number] Date of Birth: [birth year]  Subjective/Objective:            Recent surg hx of rectal area now with abcessess        Action/Plan: home when stable   Expected Discharge Date:                  Expected Discharge Plan:  Home/Self Care  In-House Referral:  NA  Discharge planning Services  CM Consult  Post Acute Care Choice:  NA Choice offered to:  NA  DME Arranged:  N/A DME Agency:  NA  HH Arranged:  NA HH Agency:  NA  Status of Service:  In process, will continue to follow  Medicare Important Message Given:    Date Medicare IM Given:    Medicare IM give by:    Date Additional Medicare IM Given:    Additional Medicare Important Message give by:     If discussed at [facility] of Stay Meetings, dates discussed:    Additional Comments:  Leeroy Cha, RN 08/23/2014, 10:24 AM

## 2014-08-23 NOTE — H&P (Signed)
[facility] History and Physical  Patient: [name]  MRN: [id number]  DOB: 1940/04/03  DOS: the patient was seen and examined on 08/22/2014 PCP: Kandice Hams, MD  Referring physician: Dr. Ayesha Rumpf Chief Complaint: Abdominal pain and diarrhea  HPI: [name] is a 74 y.o. female with Past medical history of ulcerative colitis with recent admission for flareup, coronary artery disease, CVA, dyslipidemia, hypothyroidism, GERD. The patient presents with complaints of abdominal pain. She also has GI bleed with bright red blood per rectum. This has been ongoing for a while and she was recently hospitalized for 2 weeks for the same complaint. She completed a course of antibiotics and was discharged on oral prednisone. With recurrent admission a CT of the abdomen was performed and after discussing with general surgery hospitalist was requested to admit the patient. At the time of my evaluation the patient was seen postoperatively and did not have any acute complaint of her abdominal pain improved significantly.   The patient is coming from home.  At her baseline ambulates without any support and walker  And is independent for most of her ADL manages her medication on her own.  Review of Systems: as mentioned in the history of present illness.  A comprehensive review of the other systems is negative.  Past Medical History  Diagnosis Date  . CAD (coronary artery disease)     unspecified  . Cerebrovascular disease   . Hyperlipidemia, mixed   . HTN (hypertension)   . Ulcerative colitis   . Diverticulitis   . Colon polyps 2006  . Thyroid disease   . Myocardial infarct   . Carotid artery occlusion 2010  . Diabetes mellitus without complication     Borderline  . GERD (gastroesophageal reflux disease)   . Arthritis    Past Surgical History  Procedure Laterality Date  . Cholecystectomy  7.06.2008    with cholangiogram  . 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 Abdominal  hysterectomy    . Colonoscopy  multiple  . Sigmoidoscopy  multiple  . Flexible sigmoidoscopy N/A 08/08/2014    Procedure: FLEXIBLE SIGMOIDOSCOPY;  Surgeon: Gatha Mayer, MD;  Location: [facility] [facility];  Service: Endoscopy;  Laterality: N/A;   Social History:  reports that she quit smoking about 18 months ago. Her smoking use included Cigarettes. She has a 12 pack-year smoking history. She has never used smokeless tobacco. She reports that she does not drink alcohol or use illicit drugs.  Allergies  Allergen Reactions  . Clindamycin/Lincomycin Diarrhea  . Ivp Dye [Iodinated Diagnostic Agents] Other (See Comments)    "almost passed out"    Family History  Problem Relation Age of Onset  . Colitis Brother     undefined  . Stomach cancer Mother   . Stroke Father     died  . Colon cancer Neg Hx     Prior to Admission medications   Medication Sig Start Date End Date Taking? Authorizing Provider  CRESTOR 40 MG tablet TAKE 1 TABLET AT BEDTIME Patient taking differently: TAKE 40 MG BY MOUTH AT BEDTIME 05/17/14  Yes Lelon Perla, MD  diphenoxylate-atropine (LOMOTIL) 2.5-0.025 MG per tablet Take 2 tablets by mouth 4 (four) times daily -  before meals and at bedtime. 08/21/14  Yes Domenic Polite, MD  mesalamine (LIALDA) 1.2 G EC tablet take 4 tablets by mouth once daily Patient taking differently: Take 4.8 g by mouth daily.  07/31/14  Yes Inda Castle, MD  metoprolol tartrate (LOPRESSOR) 25  MG tablet take 1 tablet by mouth twice a day Patient taking differently: TAKE 25 MG BY MOUTH TWICE DAILY 05/18/14  Yes Lelon Perla, MD  predniSONE (DELTASONE) 10 MG tablet Take 2 tablets (20 mg total) by mouth 2 (two) times daily. 08/06/14  Yes Milus Banister, MD  saccharomyces boulardii (FLORASTOR) 250 MG capsule Take 1 capsule (250 mg total) by mouth 2 (two) times daily. 08/03/14  Yes Amy S [name], PA-C  hydrochlorothiazide (MICROZIDE) 12.5 MG capsule Take 12.5 mg by mouth daily. 08/16/14    Historical Provider, MD    Physical Exam: Filed Vitals:   08/23/14 0200 08/23/14 0300 08/23/14 0400 08/23/14 0434  BP: 113/57  97/48   Pulse: 82 79 72   Temp:    98.5 F (36.9 C)  TempSrc:    Oral  Resp: 12 12 15    Height:      Weight:    104 kg (229 lb 4.5 oz)  SpO2: 99% 98% 97%     General: Alert, Awake and Oriented to Time, Place and Person. Appear in mild distress Eyes: PERRL ENT: Oral Mucosa clear moist. Neck: no JVD Cardiovascular: S1 and S2 Present, no Murmur, Peripheral Pulses Present Respiratory: Bilateral Air entry equal and Decreased,  Clear to Auscultation, no Crackles, no wheezes Abdomen: Bowel Sound present, Soft and non tender Skin: no Rash Extremities: no Pedal edema, no calf tenderness Neurologic: Grossly no focal neuro deficit.  Labs on Admission:  CBC:  Recent Labs Lab 08/18/14 0414 08/20/14 0507 08/21/14 0556 08/22/14 1610 08/23/14 0345  WBC 11.3* 9.5 10.0 13.2* 14.7*  NEUTROABS  --   --   --  11.9*  --   HGB 12.8 12.6 11.6* 11.3* 10.2*  HCT 38.7 38.3 35.7* 34.7* 31.2*  MCV 92.8 92.5 93.5 93.3 94.3  PLT 251 270 228 236 217    CMP     Component Value Date/Time   NA 130* 08/23/2014 0345   K 4.2 08/23/2014 0345   CL 98* 08/23/2014 0345   CO2 25 08/23/2014 0345   GLUCOSE 153* 08/23/2014 0345   BUN 12 08/23/2014 0345   CREATININE 0.80 08/23/2014 0345   CREATININE 1.08 03/28/2014 1022   CALCIUM 7.0* 08/23/2014 0345   PROT 4.9* 08/23/2014 0345   ALBUMIN 2.0* 08/23/2014 0345   AST 15 08/23/2014 0345   ALT 13* 08/23/2014 0345   ALKPHOS 46 08/23/2014 0345   BILITOT 0.7 08/23/2014 0345   GFRNONAA >60 08/23/2014 0345   GFRNONAA 50* 03/28/2014 1022   GFRAA >60 08/23/2014 0345   GFRAA 58* 03/28/2014 1022    No results for input(s): LIPASE, AMYLASE in the last 168 hours.  No results for input(s): CKTOTAL, CKMB, CKMBINDEX, TROPONINI in the last 168 hours. BNP (last 3 results) No results for input(s): BNP in the last 8760 hours.  ProBNP  (last 3 results) No results for input(s): PROBNP in the last 8760 hours.   Radiological Exams on Admission: Ct Abdomen Pelvis W Contrast  08/22/2014   CLINICAL DATA:  Patient with abdominal pain and rectal bleeding beginning this morning. Patient with recent ulcerative colitis flare up.  EXAM: CT ABDOMEN AND PELVIS WITH CONTRAST  TECHNIQUE: Multidetector CT imaging of the abdomen and pelvis was performed using the standard protocol following bolus administration of intravenous contrast.  CONTRAST:  112mL OMNIPAQUE IOHEXOL 300 MG/ML  SOLN  COMPARISON:  CT 08/20/2006.  FINDINGS: Lower chest: Dependent atelectasis left lower lobe. Normal heart size. Small hiatal hernia.  Hepatobiliary: Liver is normal in  size and contour without focal hepatic lesion identified. Patient status post cholecystectomy.  Pancreas: Unremarkable  Spleen: Unremarkable  Adrenals/Urinary Tract: Stable thickening left adrenal gland. 2.2 cm left renal cyst. Additional tiny bilateral lower attenuation renal lesions too small to characterize.  Stomach/Bowel: There is wall thickening at the level of the rectum. Additionally, best demonstrated on the coronal images (image 63; series 5) there is suggestion of probable perforation and adjacent small perirectal abscess. Wall thickening of the distal transverse and descending colon. There is an extensive amount of gas within the right gluteal soft tissues at the level of the rectum extending to the gluteal musculature and cranially throughout the retroperitoneum.  Vascular/Lymphatic: Extensive calcified atherosclerotic plaque involving the abdominal aorta. Infrarenal abdominal aortic ectasia, 2.2 cm. No retroperitoneal lymphadenopathy.  Other: Catheter is present within the vagina. Contrast demonstrated within the vagina.  Musculoskeletal: Lower lumbar spine degenerative changes. No aggressive or acute appearing osseous lesions. Extensive amount of soft tissue gas within the right gluteal subcutaneous  fat extending into the gluteal musculature. Additionally this gas extends cranially along the retroperitoneum. Soft tissue thickening of the right subcutaneous gluteal soft tissues.  IMPRESSION: Extensive soft tissue gas within the right gluteal fat extending into the right gluteal musculature as well as extending cranially throughout the retroperitoneum. Findings are concerning for an aggressive infectious process in the setting of retroperitoneal fasciitis. Potential causative etiology may be distal colitis with perforation and associated abscess formation. Recommend surgical consultation and broad-spectrum antibiotic therapy.  Wall thickening of the distal transverse and descending colon likely secondary to colitis.  Critical Value/emergent results were called by telephone at the time of interpretation on 08/22/2014 at 8:57 pm to [facility] , who verbally acknowledged these results.   Electronically Signed   By: Lovey Newcomer M.D.   On: 08/22/2014 21:09   Assessment/Plan Principal Problem:   Peri-rectal abscess Active Problems:   Essential hypertension   Coronary atherosclerosis   Cerebrovascular disease   Ulcerative colitis   Dehydration   1. Peri-rectal abscess  Possible necrotizing fasciitis.  The patient is presenting with complaints of abdominal pain with GI bleeding. A CT scan of the abdomen was questioning possible retroperitoneal fasciitis. Gen. surgery has evaluated the patient and taken the patient to or. We will follow recommendations from general surgery for further management. Currently she will be on broad-spectrum antibiotics with vancomycin and Zosyn.  2.Ulcerative colitis. Currently the patient is a clear liquid diet. We will continue her home medications. Holding off on oral prednisone at present due to recent dose of IV steroid, as well as recent surgery to promote healing  3 Essential hypertension. Holding home medications.  4.GI bleeding. H&H remained  stable. Continue close monitoring.  Advance goals of care discussion: Full code   Consults: ED discussed with general surgery.  DVT Prophylaxis: subcutaneous Heparin Nutrition: Clear liquid diet  Disposition: Admitted as inpatient, step-down unit.  Author: Berle Mull, MD [facility] Pager: [phone] 08/23/2014  If 7PM-7AM, please contact night-coverage [email or web address] Password [email or web address]

## 2014-08-23 NOTE — Op Note (Signed)
Operative Note  [name] female 74 y.o. 08/23/2014  PREOPERATIVE DX:  Complex perirectal abscess  POSTOPERATIVE DX:  Same  PROCEDURE:   Complex incision and drainage of complex perirectal abscess         Surgeon: Odis Hollingshead   Assistants: None  Anesthesia: General LMA anesthesia  Indications:   This is a 74 year old female who was recently discharged from the hospital after treatment for an ulcerative colitis exacerbation. She had active proctitis and colitis beginning at the rectum and extending to 40 cm up into the descending colon. She developed worsening perirectal pain and presented to the hospital where she was evaluated in the emergency department. CT scan demonstrated a complex ascending perirectal abscess. She is now brought to the operating room for emergency drainage.    Procedure Detail:  She was brought to the operating room placed supine on the operating table and a general anesthetic was given. A Foley catheter was inserted. She was placed in the lithotomy position. The perianal area was sterilely prepped and draped.  There is a small draining area in the right perianal area. I made a linear incision here and a large amount of brown purulent fluid drained from the wound. I made a cruciate incision and cut one corner creating a circular defect. There is a very large perirectal cavity extending up toward the right vulva and down posteriorly. I did a digital rectal exam and did not appreciate any rectal or anal defect.   Loculations in the abscess cavity were broken up digitally. Some necrotic tissue was debrided.  Cultures were sent of the abscess fluid. Once the abscess cavity was adequately drained, I placed a half inch Penrose drain posteriorly into the wound and anteriorly into the wound. The wound extended approximately 8 cm.  The drains were anchored to the skin with 3-0 chromic suture. A bulky dressing was applied.  She tolerated the procedure well without  any apparent complication and was taken recovery room in satisfactory condition.

## 2014-08-24 LAB — CBC
HEMATOCRIT: 29.1 % — AB (ref 36.0–46.0)
Hemoglobin: 9.3 g/dL — ABNORMAL LOW (ref 12.0–15.0)
MCH: 30.2 pg (ref 26.0–34.0)
MCHC: 32 g/dL (ref 30.0–36.0)
MCV: 94.5 fL (ref 78.0–100.0)
Platelets: 171 10*3/uL (ref 150–400)
RBC: 3.08 MIL/uL — ABNORMAL LOW (ref 3.87–5.11)
RDW: 14.4 % (ref 11.5–15.5)
WBC: 11.1 10*3/uL — ABNORMAL HIGH (ref 4.0–10.5)

## 2014-08-24 LAB — BASIC METABOLIC PANEL
Anion gap: 6 (ref 5–15)
BUN: 11 mg/dL (ref 6–20)
CO2: 26 mmol/L (ref 22–32)
Calcium: 7.4 mg/dL — ABNORMAL LOW (ref 8.9–10.3)
Chloride: 99 mmol/L — ABNORMAL LOW (ref 101–111)
Creatinine, Ser: 0.71 mg/dL (ref 0.44–1.00)
GFR calc Af Amer: >60 mL/min (ref >60)
GLUCOSE: 112 mg/dL — AB (ref 65–99)
POTASSIUM: 3.8 mmol/L (ref 3.5–5.1)
SODIUM: 131 mmol/L — AB (ref 135–145)

## 2014-08-24 NOTE — Progress Notes (Addendum)
Patient ID: [name], female   DOB: [birth year], 74 y.o.   MRN: [id number] [facility] PROGRESS NOTE  [name] OHY:[id number] DOB: 28-Jan-1941 DOA: 08/22/2014 PCP: Kandice Hams, MD  Brief narrative:    74 y.o. female with past medical history of dyslipidemia, hypertension, ulcerative colitis and recently hospitalized from 08/07/2014 through 08/21/2014 for ulcerative colitis flare who presented to [facility] long hospital with perianal pain started the night prior to the admission. CT scan on the admission demonstrated a complex perirectal infection extending up toward the retroperitoneal area. On her recent admission she had flexible sigmoidoscopy 08/08/2014 which demonstrated inflammatory changes from the rectum consistent with ulcerative colitis. Patient is status post incision and drainage of the large issue rectal abscess on the right side. She was started on vancomycin and Zosyn. Postprocedure she was doing fine but has developed large amount of bleed on the morning of 08/23/2014.  Barrier to discharge: Patient still with intermittent rectal bleed. We will continue current antibodies. Abscess cultures are still pending. Transfer to telemetry floor today. Monitor CBC daily.  Assessment/Plan:    Principal Problem:   Sepsis secondary to peri-rectal abscess / leukocytosis  - Sepsis criteria met on the admission with tachycardia, tachypnea, hypotension, leukocytosis. Source of infection thought to be perirectal abscess. - Started on empiric vancomycin and Zosyn - Status post incision and drainage on 08/23/2014 by surgery. - She still has intermittent rectal bleed. Hemoglobin is 9.3 this morning. - She did not require blood transfusion during this hospital stay - Continue to monitor CBC daily - Transfer to telemetry floor today. She is hemodynamically stable at this time.  Active Problems: Acute blood loss anemia - Likely postprocedure from incision and drainage done 08/23/2014 -  She still reports small amount of bleeding per rectum. - Small drop in hemoglobin from 10.1 down to 9.3 this morning. - Started Protonix 40 mg IV every 12 hours on 08/23/2014 - Continue to monitor daily CBC    Essential hypertension - Blood pressure medications on hold because of soft blood pressure. Blood pressure this morning 106/56    Ulcerative colitis - Recently admitted and treated for ulcerative colitis flareup. - Other than rectal abscess there is no complaint of abdominal pain at this point. No diarrhea. - Has had recent flexible sigmoidoscopy 08/08/2014 with findings consistent with ulcerative colitis. - She is not on steroids     Hyponatremia - Likely related to acute infection, sepsis - Sodium stable, 131. - We'll continue IV fluids.    DVT Prophylaxis  - SCD's bilaterally due to risk of bleeding   Code Status: Full.  Family Communication:  plan of care discussed with the patient Disposition Plan: Transfer to telemetry floor today  IV access:  Peripheral IV  Procedures and diagnostic studies:    Ct Abdomen Pelvis W Contrast 08/22/2014  Extensive soft tissue gas within the right gluteal fat extending into the right gluteal musculature as well as extending cranially throughout the retroperitoneum. Findings are concerning for an aggressive infectious process in the setting of retroperitoneal fasciitis. Potential causative etiology may be distal colitis with perforation and associated abscess formation. Recommend surgical consultation and broad-spectrum antibiotic therapy.  Wall thickening of the distal transverse and descending colon likely secondary to colitis.    Medical Consultants:  Surgery, Dr. Fanny Skates   Other Consultants:  None   IAnti-Infectives:   Vancomycin 08/22/2014 --> Zosyn 08/22/2014 -->   Leisa Lenz, MD  [facility] Pager [phone]  Time spent in minutes: 25 minutes  If 7PM-7AM, please contact night-coverage [email or web address] Password  [email or web address] 08/24/2014, 10:11 AM   LOS: 2 days    HPI/Subjective: No acute overnight events. Patient reports still some bleeding from rectum.  Objective: Filed Vitals:   08/24/14 0022 08/24/14 0324 08/24/14 0400 08/24/14 0800  BP:   106/56   Pulse:   63   Temp: 98.2 F (36.8 C) 98.9 F (37.2 C)  98.5 F (36.9 C)  TempSrc: Oral Oral  Oral  Resp:   16   Height:      Weight:      SpO2:   97%     Intake/Output Summary (Last 24 hours) at 08/24/14 1011 Last data filed at 08/24/14 0755  Gross per 24 hour  Intake   1725 ml  Output    750 ml  Net    975 ml    Exam:   General:  Pt is alert, not in acute distress  Cardiovascular: Regular rate and rhythm, S1/S2 appreciated   Respiratory: Bilateral air entry, no wheezing  Abdomen: Nontender abdomen, not distended, appreciate bowel sounds  Extremities: No leg swelling, pulses palpable  Neuro: No focal deficits  Data Reviewed: Basic Metabolic Panel:  Recent Labs Lab 08/20/14 0507 08/21/14 0556 08/22/14 1610 08/23/14 0345 08/24/14 0350  NA 131* 133* 130* 130* 131*  K 4.9 4.9 4.4 4.2 3.8  CL 91* 94* 94* 98* 99*  CO2 32 33* 29 25 26   GLUCOSE 144* 134* 145* 153* 112*  BUN 8 8 11 12 11   CREATININE 0.84 0.78 0.87 0.80 0.71  CALCIUM 8.2* 8.1* 7.8* 7.0* 7.4*   Liver Function Tests:  Recent Labs Lab 08/22/14 1610 08/23/14 0345  AST 18 15  ALT 14 13*  ALKPHOS 57 46  BILITOT 0.8 0.7  PROT 5.5* 4.9*  ALBUMIN 2.3* 2.0*   No results for input(s): LIPASE, AMYLASE in the last 168 hours. No results for input(s): AMMONIA in the last 168 hours. CBC:  Recent Labs Lab 08/20/14 0507 08/21/14 0556 08/22/14 1610 08/23/14 0345 08/23/14 1000 08/24/14 0350  WBC 9.5 10.0 13.2* 14.7*  --  11.1*  NEUTROABS  --   --  11.9*  --   --   --   HGB 12.6 11.6* 11.3* 10.2* 10.1* 9.3*  HCT 38.3 35.7* 34.7* 31.2* 30.5* 29.1*  MCV 92.5 93.5 93.3 94.3  --  94.5  PLT 270 228 236 217  --  171   Cardiac Enzymes: No results for input(s):  CKTOTAL, CKMB, CKMBINDEX, TROPONINI in the last 168 hours. BNP: Invalid input(s): POCBNP CBG:  Recent Labs Lab 08/23/14 0045  GLUCAP 143*     Collection Time: 08/23/14 12:03 AM  Result Value Ref Range Status   Specimen Description ABSCESS PERIRECTAL  Final   Special Requests PATIENT ON FOLLOWING ZOYSN, VANC  Final   Gram Stain   Final    NO WBC SEEN NO SQUAMOUS EPITHELIAL CELLS SEEN MODERATE GRAM NEGATIVE RODS FEW YEAST RARE GRAM POSITIVE COCCI    Culture PENDING  Incomplete   Report Status PENDING  Incomplete  Culture, routine-abscess     Status: None (Preliminary result)   Collection Time: 08/23/14 12:03 AM  Result Value Ref Range Status   Specimen Description ABSCESS PERIRECTAL  Final   Special Requests PATIENT ON FOLLOWING ZOYSN, VANC  Final   Gram Stain   Final    NO WBC SEEN NO SQUAMOUS EPITHELIAL CELLS SEEN MODERATE GRAM NEGATIVE RODS RARE YEAST RARE GRAM POSITIVE COCCI    Culture PENDING  Incomplete  Report Status PENDING  Incomplete  MRSA PCR Screening     Status: None   Collection Time: 08/23/14  1:32 AM  Result Value Ref Range Status   MRSA by PCR NEGATIVE NEGATIVE Final     Scheduled Meds: . pantoprazole (PROTONIX)   40 mg Intravenous Q12H  . piperacillin-tazobactam (ZOSYN)  IV  3.375 g Intravenous 3 times per day  . vancomycin  750 mg Intravenous Q12H   Continuous Infusions: . sodium chloride 75 mL/hr at 08/23/14 2000

## 2014-08-24 NOTE — Progress Notes (Signed)
PT Note  Patient Details Name: LANEISHA MINO MRN: [id number] DOB: [birth year]  We were ordered to Assess Ms. Cranston's abscess to see if hydrotherapy was needed. After assessing it with pt's nurse , Shanon Brow, there are no clinical indications for hydro therapy at this time, for I saw no visible necrotic tissue needing debridement with sharps or pulsed lavage. As Dr. Dalbert Batman stated, would continue to recommend nursing to perform sitz baths for pt and dressing changes.   Thank you for the consult, please reorder if things change.    Clide Dales 08/24/2014, 12:17 PM  Clide Dales, PT Pager: [phone] 08/24/2014

## 2014-08-24 NOTE — Progress Notes (Signed)
2 Days Post-Op  Subjective: Alert.  Says she feels weak.  Denies nausea. Says her abdomen is uncomfortable  Had some rectal bleeding, which seems self-limited. Hemoglobin has drifted down from 10.2 to 9.3  Remains on high-dose steroid-induced, ulcerative colitis.  Objective: Vital signs in last 24 hours: Temp:  [97.8 F (36.6 C)-98.9 F (37.2 C)] 98.9 F (37.2 C) (07/07 0324) Pulse Rate:  [63-113] 63 (07/07 0400) Resp:  [15-24] 16 (07/07 0400) BP: (106-129)/(47-64) 106/56 mmHg (07/07 0400) SpO2:  [87 %-99 %] 97 % (07/07 0400) Last BM Date: 08/23/14  Intake/Output from previous day: 07/06 0701 - 07/07 0700 In: 2220 [P.O.:120; I.V.:1650; IV Piggyback:450] Out: 375 [Urine:375] Intake/Output this shift: Total I/O In: -  Out: 375 [Urine:375]  General appearance: Alert.  Cooperative.  A little unhappy.  Says she is very tired. GI: soft, non-tender; bowel sounds normal; no masses,  no organomegaly Incision/Wound:  Periapical rectal abscess appears to be well drained.  Tissues are soft.  The Penrose is had backed out and so I reinserted them.  No significant skin necrosis or cellulitis.  No active bleeding at this time.  Lab Results:  Results for orders placed or performed during the hospital encounter of 08/22/14 (from the past 24 hour(s))  Hemoglobin and hematocrit, blood     Status: Abnormal   Collection Time: 08/23/14 10:00 AM  Result Value Ref Range   Hemoglobin 10.1 (L) 12.0 - 15.0 g/dL   HCT 30.5 (L) 36.0 - 46.0 %  CBC     Status: Abnormal   Collection Time: 08/24/14  3:50 AM  Result Value Ref Range   WBC 11.1 (H) 4.0 - 10.5 K/uL   RBC 3.08 (L) 3.87 - 5.11 MIL/uL   Hemoglobin 9.3 (L) 12.0 - 15.0 g/dL   HCT 29.1 (L) 36.0 - 46.0 %   MCV 94.5 78.0 - 100.0 fL   MCH 30.2 26.0 - 34.0 pg   MCHC 32.0 30.0 - 36.0 g/dL   RDW 14.4 11.5 - 15.5 %   Platelets 171 150 - 400 K/uL  Basic metabolic panel     Status: Abnormal   Collection Time: 08/24/14  3:50 AM  Result Value Ref  Range   Sodium 131 (L) 135 - 145 mmol/L   Potassium 3.8 3.5 - 5.1 mmol/L   Chloride 99 (L) 101 - 111 mmol/L   CO2 26 22 - 32 mmol/L   Glucose, Bld 112 (H) 65 - 99 mg/dL   BUN 11 6 - 20 mg/dL   Creatinine, Ser 0.71 0.44 - 1.00 mg/dL   Calcium 7.4 (L) 8.9 - 10.3 mg/dL   GFR calc non Af Amer >60 >60 mL/min   GFR calc Af Amer >60 >60 mL/min   Anion gap 6 5 - 15     Studies/Results: No results found.  . pantoprazole (PROTONIX) IV  40 mg Intravenous Q12H  . piperacillin-tazobactam (ZOSYN)  IV  3.375 g Intravenous 3 times per day  . sodium chloride  3 mL Intravenous Q12H  . vancomycin  750 mg Intravenous Q12H     Assessment/Plan: s/p Procedure(s): IRRIGATION AND DEBRIDEMENT PERIRECTAL ABSCESS  POD #2. Incision and drainage large ischio rectal abscess, right-sided Continue broad-spectrum antibiotic and wound care Mobilize out of bed if possible Begin twice a day sitz baths or hydrotherapy.  Consult PT to assist.  Ulcerative colitis. Recent hospitalization for flareup. Unfortunately, currently on high-dose steroids. This will have negative at affect on wound healing and infection control. Would advocate steroid taper, if  medically indicated.  Coronary artery disease, status post CABG,  History myocardial infarction. History CVA Hypertension GERD   @PROBHOSP @  LOS: 2 days    Ido Wollman M 08/24/2014  . .prob

## 2014-08-25 LAB — CBC
HCT: 30.2 % — ABNORMAL LOW (ref 36.0–46.0)
Hemoglobin: 9.4 g/dL — ABNORMAL LOW (ref 12.0–15.0)
MCH: 29.7 pg (ref 26.0–34.0)
MCHC: 31.1 g/dL (ref 30.0–36.0)
MCV: 95.6 fL (ref 78.0–100.0)
PLATELETS: 141 10*3/uL — AB (ref 150–400)
RBC: 3.16 MIL/uL — AB (ref 3.87–5.11)
RDW: 14.2 % (ref 11.5–15.5)
WBC: 9.1 10*3/uL (ref 4.0–10.5)

## 2014-08-25 LAB — BASIC METABOLIC PANEL
Anion gap: 6 (ref 5–15)
BUN: 7 mg/dL (ref 6–20)
CO2: 28 mmol/L (ref 22–32)
Calcium: 7.4 mg/dL — ABNORMAL LOW (ref 8.9–10.3)
Chloride: 97 mmol/L — ABNORMAL LOW (ref 101–111)
Creatinine, Ser: 0.68 mg/dL (ref 0.44–1.00)
GFR calc Af Amer: >60 mL/min (ref >60)
Glucose, Bld: 85 mg/dL (ref 65–99)
POTASSIUM: 3.7 mmol/L (ref 3.5–5.1)
Sodium: 131 mmol/L — ABNORMAL LOW (ref 135–145)

## 2014-08-25 LAB — VANCOMYCIN, TROUGH: Vancomycin Tr: 13 ug/mL (ref 10.0–20.0)

## 2014-08-25 MED ORDER — VANCOMYCIN HCL IN DEXTROSE 1-5 GM/200ML-% IV SOLN
1000.0000 mg | Freq: Two times a day (BID) | INTRAVENOUS | Status: DC
  Administered 2014-08-25 – 2014-08-26 (×2): 1000 mg via INTRAVENOUS
  Filled 2014-08-25 (×2): qty 200

## 2014-08-25 MED ORDER — FLUCONAZOLE 200 MG PO TABS
400.0000 mg | ORAL_TABLET | Freq: Every day | ORAL | Status: DC
  Administered 2014-08-25 – 2014-09-06 (×11): 400 mg via ORAL
  Filled 2014-08-25 (×13): qty 2

## 2014-08-25 NOTE — Progress Notes (Signed)
Dear Doctor: Charlies Silvers This patient has been identified as a candidate for PICC for the following reason (s): IV therapy over 48 hours, drug extravasation potential with tissue necrosis (KCL, Dilantin, Dopamine, CaCl, MgSO4, chemo vesicant) and poor veins/poor circulatory system (CHF, COPD, emphysema, diabetes, steroid use, IV drug abuse, etc.) If you agree, please write an order for the indicated device. For any questions contact the Vascular Access Team at [phone] if no answer, please leave a message.  Thank you for supporting the early vascular access assessment program.

## 2014-08-25 NOTE — Care Management (Signed)
Important Message  Patient Details  Name: [name] MRN: [id number] Date of Birth: Aug 04, 1940   Medicare Important Message Given:  Yes-second notification given    Camillo Flaming 08/25/2014, 11:52 AM

## 2014-08-25 NOTE — Progress Notes (Signed)
3 Days Post-Op  Subjective: Transferred out of stepdown unit to floor yesterday. Still having low volume bloody diarrhea. Says she is weak and has some perianal pain and some abdominal pain. Not on sterilely to currently. Started hydrotherapy yesterday. Penrose drains will not stay up in abscess cavity, but seems well-drained.  Cultures growing multiple organisms and yeast.  Objective: Vital signs in last 24 hours: Temp:  [98.2 F (36.8 C)-98.7 F (37.1 C)] 98.3 F (36.8 C) (07/08 0540) Pulse Rate:  [75-92] 78 (07/08 0540) Resp:  [18] 18 (07/08 0540) BP: (119-128)/(63-84) 128/84 mmHg (07/08 0540) SpO2:  [96 %-98 %] 98 % (07/08 0540) Last BM Date: 08/24/14  Intake/Output from previous day: 07/07 0701 - 07/08 0700 In: 1995 [P.O.:120; I.V.:1575; IV Piggyback:300] Out: 1200 [Urine:1200] Intake/Output this shift:    General appearance: Alert and cooperative.  Seems unhappy and slightly deconditioned.  Does not look toxic. GI: Abdomen soft and benign but does report discomfort to deep palpation. Incision/Wound:  Right-sided perianal abscess wound is open and draining well.  No gross purulence or unusual odor.  No significant skin necrosis or cellulitis.  I removed the Penrose drains by cutting the sutures.  Small amount of bloody diarrhea noted.  Lab Results:  Results for orders placed or performed during the hospital encounter of 08/22/14 (from the past 24 hour(s))  CBC     Status: Abnormal   Collection Time: 08/25/14  9:00 AM  Result Value Ref Range   WBC 9.1 4.0 - 10.5 K/uL   RBC 3.16 (L) 3.87 - 5.11 MIL/uL   Hemoglobin 9.4 (L) 12.0 - 15.0 g/dL   HCT 30.2 (L) 36.0 - 46.0 %   MCV 95.6 78.0 - 100.0 fL   MCH 29.7 26.0 - 34.0 pg   MCHC 31.1 30.0 - 36.0 g/dL   RDW 14.2 11.5 - 15.5 %   Platelets 141 (L) 150 - 400 K/uL     Studies/Results: No results found.  . fluconazole  200 mg Oral Daily  . pantoprazole (PROTONIX) IV  40 mg Intravenous Q12H  . piperacillin-tazobactam  (ZOSYN)  IV  3.375 g Intravenous 3 times per day  . sodium chloride  3 mL Intravenous Q12H  . vancomycin  750 mg Intravenous Q12H     Assessment/Plan: s/p Procedure(s): IRRIGATION AND DEBRIDEMENT PERIRECTAL ABSCESS  POD #3. Incision and drainage large ischio rectal abscess, right-sided Continue broad-spectrum antibiotic and wound care I have added oral Diflucan because there are yeast in the cultures. Penrose drains removed today.  They are ineffective and will not stay in place. Mobilize out of bed if possible twice a day sitz baths or hydrotherapy. Consult PT to assist.  Ulcerative colitis. Recent hospitalization for flareup. Consider GI consult with Dr. Ronnette Hila group If Desired to Advise regarding Medical Management.   Coronary artery disease, status post CABG,  History myocardial infarction. History CVA Hypertension GERD  @PROBHOSP @  LOS: 3 days    [name] M 08/25/2014  . .prob

## 2014-08-25 NOTE — Progress Notes (Addendum)
Patient ID: [name], female   DOB: 1940-09-13, 74 y.o.   MRN: [id number] [facility] PROGRESS NOTE  [name] ATF:[id number] DOB: 07-25-1940 DOA: 08/22/2014 PCP: Kandice Hams, MD  Brief narrative:    74 y.o. female with past medical history of dyslipidemia, hypertension, ulcerative colitis and recently hospitalized from 08/07/2014 through 08/21/2014 for ulcerative colitis flare who presented to [facility] with perianal pain started the night prior to the admission. CT scan on the admission demonstrated a complex perirectal infection extending up toward the retroperitoneal area. On her recent admission she had flexible sigmoidoscopy 08/08/2014 which demonstrated inflammatory changes from the rectum consistent with ulcerative colitis. Patient is status post incision and drainage of the large issue rectal abscess on the right side. She was started on vancomycin and Zosyn. Postprocedure she was doing fine but has developed large amount of bleed on the morning of 08/23/2014. She was transferred out of the stepdown unit 08/24/2014.  Barrier to discharge: Discharge by 08/28/2014 if rectal bleed resolves and if abscess culture final report is back.  Assessment/Plan:    Principal Problem:   Sepsis secondary to peri-rectal abscess / leukocytosis  - Sepsis criteria met on the admission with tachycardia, tachypnea, hypotension, leukocytosis. Source of infection thought to be perirectal abscess. - Patient was started on vancomycin and Zosyn at the time of the admission - Patient underwent incision and drainage by surgery 08/23/2014 - The culture from abscess, final report is still pending - She continues to be hemodynamically stable. She was transferred out of step down unit 08/24/2014. - We will continue current abx until final culture of the abscess is reported.  Active Problems: Acute blood loss anemia - Likely postprocedure from incision and drainage done 08/23/2014 - Patient  developed a lot of bleeding per rectum 08/24/2014 but her bleeding has somewhat stabilized. She reports she still has some bleeding per rectum this morning. - Her hemoglobin is stable at 9.4 but it did drop from 11.6 on the admission. - Continue to monitor daily CBC - Continue Protonix 40 mg IV every 12 hours.    Essential hypertension - Blood pressure medications on hold due to soft blood pressure.    Ulcerative colitis with abscess - Recently admitted and treated for ulcerative colitis flareup. - Has had recent flexible sigmoidoscopy 08/08/2014 with findings consistent with ulcerative colitis. - She is not on steroids at this time    Hyponatremia - Likely related to acute infection, sepsis - Sodium stable, 131.   DVT Prophylaxis  - SCD's bilaterally due to risk of bleeding   Code Status: Full.  Family Communication:  plan of care discussed with the patient Disposition Plan: Likely discharge by 08/28/2014 once final culture results are back and if rectal bleed resolves.  IV access:  Peripheral IV  Procedures and diagnostic studies:    Ct Abdomen Pelvis W Contrast 08/22/2014  Extensive soft tissue gas within the right gluteal fat extending into the right gluteal musculature as well as extending cranially throughout the retroperitoneum. Findings are concerning for an aggressive infectious process in the setting of retroperitoneal fasciitis. Potential causative etiology may be distal colitis with perforation and associated abscess formation. Recommend surgical consultation and broad-spectrum antibiotic therapy.  Wall thickening of the distal transverse and descending colon likely secondary to colitis.    Medical Consultants:  Surgery, Dr. Fanny Skates   Other Consultants:  None   IAnti-Infectives:   Vancomycin 08/22/2014 --> Zosyn 08/22/2014 -->   Taleia Sadowski, MD  [facility]  Pager [phone]  Time spent in minutes: 25 minutes  If 7PM-7AM, please contact  night-coverage [email or web address] Password [facility] 08/25/2014, 11:27 AM   LOS: 3 days    HPI/Subjective: No acute overnight events. Patient reports feeling little bit better this morning  Objective: Filed Vitals:   08/24/14 0800 08/24/14 1112 08/24/14 2033 08/25/14 0540  BP: 126/52 119/63 128/65 128/84  Pulse: 39 75 92 78  Temp: 98.5 F (36.9 C) 98.2 F (36.8 C) 98.7 F (37.1 C) 98.3 F (36.8 C)  TempSrc: Oral Oral Oral Oral  Resp: 16 18 18 18   Height:      Weight:      SpO2: 100% 98% 96% 98%    Intake/Output Summary (Last 24 hours) at 08/25/14 1127 Last data filed at 08/25/14 0541  Gross per 24 hour  Intake   1495 ml  Output    825 ml  Net    670 ml    Exam:   General:  Pt is alert, awake, no distress  Cardiovascular: Rate control, appreciate S1-S2  Respiratory: Clear to auscultation, no wheezing, no rhonchi  Abdomen: No distention, nontender abdomen, appreciate bowel sounds  Extremities: No lower extremity edema, palpable pulses bilaterally  Neuro: Nonfocal  Data Reviewed: Basic Metabolic Panel:  Recent Labs Lab 08/21/14 0556 08/22/14 1610 08/23/14 0345 08/24/14 0350 08/25/14 0900  NA 133* 130* 130* 131* 131*  K 4.9 4.4 4.2 3.8 3.7  CL 94* 94* 98* 99* 97*  CO2 33* 29 25 26 28   GLUCOSE 134* 145* 153* 112* 85  BUN 8 11 12 11 7   CREATININE 0.78 0.87 0.80 0.71 0.68  CALCIUM 8.1* 7.8* 7.0* 7.4* 7.4*   Liver Function Tests:  Recent Labs Lab 08/22/14 1610 08/23/14 0345  AST 18 15  ALT 14 13*  ALKPHOS 57 46  BILITOT 0.8 0.7  PROT 5.5* 4.9*  ALBUMIN 2.3* 2.0*   No results for input(s): LIPASE, AMYLASE in the last 168 hours. No results for input(s): AMMONIA in the last 168 hours. CBC:  Recent Labs Lab 08/21/14 0556 08/22/14 1610 08/23/14 0345 08/23/14 1000 08/24/14 0350 08/25/14 0900  WBC 10.0 13.2* 14.7*  --  11.1* 9.1  NEUTROABS  --  11.9*  --   --   --   --   HGB 11.6* 11.3* 10.2* 10.1* 9.3* 9.4*  HCT 35.7* 34.7* 31.2* 30.5* 29.1*  30.2*  MCV 93.5 93.3 94.3  --  94.5 95.6  PLT 228 236 217  --  171 141*   Cardiac Enzymes: No results for input(s): CKTOTAL, CKMB, CKMBINDEX, TROPONINI in the last 168 hours. BNP: Invalid input(s): POCBNP CBG:  Recent Labs Lab 08/23/14 0045  GLUCAP 143*     Collection Time: 08/23/14 12:03 AM  Result Value Ref Range Status   Specimen Description ABSCESS PERIRECTAL  Final   Special Requests PATIENT ON FOLLOWING ZOYSN, VANC  Final   Gram Stain   Final    NO WBC SEEN NO SQUAMOUS EPITHELIAL CELLS SEEN MODERATE GRAM NEGATIVE RODS FEW YEAST RARE GRAM POSITIVE COCCI    Culture PENDING  Incomplete   Report Status PENDING  Incomplete  Culture, routine-abscess     Status: None (Preliminary result)   Collection Time: 08/23/14 12:03 AM  Result Value Ref Range Status   Specimen Description ABSCESS PERIRECTAL  Final   Special Requests PATIENT ON FOLLOWING ZOYSN, VANC  Final   Gram Stain   Final    NO WBC SEEN NO SQUAMOUS EPITHELIAL CELLS SEEN MODERATE GRAM NEGATIVE RODS RARE  YEAST RARE GRAM POSITIVE COCCI    Culture PENDING  Incomplete   Report Status PENDING  Incomplete  MRSA PCR Screening     Status: None   Collection Time: 08/23/14  1:32 AM  Result Value Ref Range Status   MRSA by PCR NEGATIVE NEGATIVE Final     Scheduled Meds: . pantoprazole (PROTONIX)   40 mg Intravenous Q12H  . piperacillin-tazobactam (ZOSYN)  IV  3.375 g Intravenous 3 times per day  . vancomycin  750 mg Intravenous Q12H   Continuous Infusions: . sodium chloride 75 mL/hr at 08/23/14 2000

## 2014-08-25 NOTE — Progress Notes (Signed)
ANTIBIOTIC CONSULT NOTE - Follow Up  Pharmacy Consult for Vancomycin / Zosyn Indication: Peri-rectal abscess  Allergies  Allergen Reactions  . Clindamycin/Lincomycin Diarrhea  . Ivp Dye [Iodinated Diagnostic Agents] Other (See Comments)    "almost passed out"    Patient Measurements: Height: 5\' 9"  (443.1 cm) Weight: 229 lb 4.5 oz (104 kg) IBW/kg (Calculated) : 66.2  Vital Signs: Temp: 98.3 F (36.8 C) (07/08 0540) Temp Source: Oral (07/08 0540) BP: 128/84 mmHg (07/08 0540) Pulse Rate: 78 (07/08 0540) Intake/Output from previous day: 07/07 0701 - 07/08 0700 In: 1995 [P.O.:120; I.V.:1575; IV Piggyback:300] Out: 1200 [Urine:1200] Intake/Output from this shift:    Labs:  Recent Labs  08/22/14 1610 08/23/14 0345 08/23/14 1000 08/24/14 0350  WBC 13.2* 14.7*  --  11.1*  HGB 11.3* 10.2* 10.1* 9.3*  PLT 236 217  --  171  CREATININE 0.87 0.80  --  0.71   Estimated Creatinine Clearance: 76.8 mL/min (by C-G formula based on Cr of 0.71). No results for input(s): VANCOTROUGH, VANCOPEAK, VANCORANDOM, GENTTROUGH, GENTPEAK, GENTRANDOM, TOBRATROUGH, TOBRAPEAK, TOBRARND, AMIKACINPEAK, AMIKACINTROU, AMIKACIN in the last 72 hours.   Microbiology: Recent Results (from the past 720 hour(s))  Clostridium Difficile by PCR (not at [facility])     Status: None   Collection Time: 08/03/14  4:15 PM  Result Value Ref Range Status   C difficile by pcr Not Detected Not Detected Final    Comment: This test is for use only with liquid or soft stools; performance characteristics of other clinical specimen types have not been established.   This assay was performed by Cepheid GeneXpert(R) PCR. The performance characteristics of this assay have been determined by [employer] [facility]. Performance characteristics refer to the analytical performance of the test.   Clostridium Difficile by PCR (not at [facility])     Status: None   Collection Time: 08/10/14  1:11 PM  Result Value Ref Range Status   C  difficile by pcr NEGATIVE NEGATIVE Final  Culture, blood (routine x 2)     Status: None (Preliminary result)   Collection Time: 08/22/14  9:30 PM  Result Value Ref Range Status   Specimen Description BLOOD LEFT HAND  Final   Special Requests BOTTLES DRAWN AEROBIC ONLY 5CC  Final   Culture   Final    NO GROWTH 2 DAYS Performed at [facility]    Report Status PENDING  Incomplete  Anaerobic culture     Status: None (Preliminary result)   Collection Time: 08/23/14 12:03 AM  Result Value Ref Range Status   Specimen Description ABSCESS PERIRECTAL  Final   Special Requests PATIENT ON FOLLOWING ZOYSN, VANC  Final   Gram Stain   Final    NO WBC SEEN NO SQUAMOUS EPITHELIAL CELLS SEEN MODERATE GRAM NEGATIVE RODS FEW YEAST RARE GRAM POSITIVE COCCI    Culture   Final    NO ANAEROBES ISOLATED; CULTURE IN PROGRESS FOR 5 DAYS Performed at [facility]    Report Status PENDING  Incomplete  Culture, routine-abscess     Status: None (Preliminary result)   Collection Time: 08/23/14 12:03 AM  Result Value Ref Range Status   Specimen Description ABSCESS PERIRECTAL  Final   Special Requests PATIENT ON FOLLOWING ZOYSN, VANC  Final   Gram Stain   Final    NO WBC SEEN NO SQUAMOUS EPITHELIAL CELLS SEEN MODERATE GRAM NEGATIVE RODS RARE YEAST RARE GRAM POSITIVE COCCI    Culture   Final    MULTIPLE ORGANISMS PRESENT, NONE  PREDOMINANT Performed at [facility]    Report Status PENDING  Incomplete  MRSA PCR Screening     Status: None   Collection Time: 08/23/14  1:32 AM  Result Value Ref Range Status   MRSA by PCR NEGATIVE NEGATIVE Final    Comment:        The GeneXpert MRSA Assay (FDA approved for NASAL specimens only), is one component of a comprehensive MRSA colonization surveillance program. It is not intended to diagnose MRSA infection nor to guide or monitor treatment for MRSA infections.   Culture, blood (routine x 2)     Status: None (Preliminary result)    Collection Time: 08/23/14  4:55 AM  Result Value Ref Range Status   Specimen Description BLOOD LEFT HAND  Final   Special Requests BOTTLES DRAWN AEROBIC ONLY 5ML  Final   Culture   Final    NO GROWTH 1 DAY Performed at [facility]    Report Status PENDING  Incomplete    Medical History: Past Medical History  Diagnosis Date  . CAD (coronary artery disease)     unspecified  . Cerebrovascular disease   . Hyperlipidemia, mixed   . HTN (hypertension)   . Ulcerative colitis   . Diverticulitis   . Colon polyps 2006  . Thyroid disease   . Myocardial infarct   . Carotid artery occlusion 2010  . Diabetes mellitus without complication     Borderline  . GERD (gastroesophageal reflux disease)   . Arthritis     Assessment: 13 yoF with hospitalization 6/20-7/4 for ulcerative colitis flare presents one day following discharge with rectal pain and abdominal bleeding . PMHx significant for CAD s/p CABG, CEA.  CT abd/pelvis shows extensive soft tissue gas within the right gluteal fat extending into the gluteal musculature and throughout retroperitoneum.  Findings concerning for aggressive infectious process in setting of retroperitoneal fasciitis. Pharmacy consulted to start vancomycin and zosyn.  Patient is now s/p I&D on 7/6 by surgery.  Abscess cultures no growth to date.  7/5 >> Vancomycin  >> 7/5 >> Zosyn >>    Today is day #4 Vancomycin 750 mg q12h and Zosyn 3.375g q8h (extended infusion).  WBC elevated but improving.  Steroids for UC flare tapered off to allow for wound healing.  Afebrile.  Renal function stable.  Vancomycin trough 13 today.  Goal of Therapy:  Vancomycin trough level 15-20 mcg/ml  Doses adjusted per renal function Eradication of infection   Plan:  1.  Increase Vancomycin to 1g IV q12h. 2.  Continue Zosyn 3.375g IV q8h (4 hour infusion time).  Hershal Coria, PharmD, BCPS Pager: [phone] 08/25/2014 8:04 AM

## 2014-08-26 DIAGNOSIS — D72829 Elevated white blood cell count, unspecified: Secondary | ICD-10-CM | POA: Diagnosis present

## 2014-08-26 DIAGNOSIS — R531 Weakness: Secondary | ICD-10-CM

## 2014-08-26 DIAGNOSIS — B379 Candidiasis, unspecified: Secondary | ICD-10-CM | POA: Diagnosis present

## 2014-08-26 DIAGNOSIS — A419 Sepsis, unspecified organism: Secondary | ICD-10-CM | POA: Diagnosis present

## 2014-08-26 DIAGNOSIS — D696 Thrombocytopenia, unspecified: Secondary | ICD-10-CM | POA: Diagnosis present

## 2014-08-26 DIAGNOSIS — E871 Hypo-osmolality and hyponatremia: Secondary | ICD-10-CM

## 2014-08-26 DIAGNOSIS — D62 Acute posthemorrhagic anemia: Secondary | ICD-10-CM | POA: Diagnosis present

## 2014-08-26 DIAGNOSIS — E876 Hypokalemia: Secondary | ICD-10-CM | POA: Diagnosis present

## 2014-08-26 LAB — BASIC METABOLIC PANEL
Anion gap: 9 (ref 5–15)
CHLORIDE: 96 mmol/L — AB (ref 101–111)
CO2: 26 mmol/L (ref 22–32)
Calcium: 7.2 mg/dL — ABNORMAL LOW (ref 8.9–10.3)
Creatinine, Ser: 0.71 mg/dL (ref 0.44–1.00)
GFR calc Af Amer: >60 mL/min (ref >60)
GFR calc non Af Amer: >60 mL/min (ref >60)
GLUCOSE: 74 mg/dL (ref 65–99)
Potassium: 3.1 mmol/L — ABNORMAL LOW (ref 3.5–5.1)
Sodium: 131 mmol/L — ABNORMAL LOW (ref 135–145)

## 2014-08-26 LAB — CBC
HCT: 29.5 % — ABNORMAL LOW (ref 36.0–46.0)
Hemoglobin: 9.6 g/dL — ABNORMAL LOW (ref 12.0–15.0)
MCH: 30.8 pg (ref 26.0–34.0)
MCHC: 32.5 g/dL (ref 30.0–36.0)
MCV: 94.6 fL (ref 78.0–100.0)
PLATELETS: 141 10*3/uL — AB (ref 150–400)
RBC: 3.12 MIL/uL — ABNORMAL LOW (ref 3.87–5.11)
RDW: 14.5 % (ref 11.5–15.5)
WBC: 8.4 10*3/uL (ref 4.0–10.5)

## 2014-08-26 LAB — CULTURE, ROUTINE-ABSCESS: Gram Stain: NONE SEEN

## 2014-08-26 MED ORDER — SODIUM CHLORIDE 0.9 % IJ SOLN
3.0000 mL | Freq: Two times a day (BID) | INTRAMUSCULAR | Status: DC
  Administered 2014-08-30 – 2014-09-20 (×10): 3 mL via INTRAVENOUS

## 2014-08-26 MED ORDER — SACCHAROMYCES BOULARDII 250 MG PO CAPS
250.0000 mg | ORAL_CAPSULE | Freq: Two times a day (BID) | ORAL | Status: DC
  Administered 2014-08-26 – 2014-09-21 (×49): 250 mg via ORAL
  Filled 2014-08-26 (×54): qty 1

## 2014-08-26 MED ORDER — LIP MEDEX EX OINT
1.0000 "application " | TOPICAL_OINTMENT | Freq: Two times a day (BID) | CUTANEOUS | Status: DC
  Administered 2014-08-26 – 2014-09-20 (×46): 1 via TOPICAL
  Filled 2014-08-26 (×3): qty 7

## 2014-08-26 MED ORDER — LACTATED RINGERS IV BOLUS (SEPSIS): 1000.0000 mL | Freq: Three times a day (TID) | INTRAVENOUS | Status: AC | PRN

## 2014-08-26 MED ORDER — SODIUM CHLORIDE 0.9 % IJ SOLN
3.0000 mL | INTRAMUSCULAR | Status: DC | PRN
  Administered 2014-09-15: 3 mL via INTRAVENOUS
  Filled 2014-08-26: qty 3

## 2014-08-26 MED ORDER — LOPERAMIDE HCL 2 MG PO CAPS
2.0000 mg | ORAL_CAPSULE | Freq: Three times a day (TID) | ORAL | Status: DC | PRN
  Administered 2014-08-31: 2 mg via ORAL
  Administered 2014-08-31: 4 mg via ORAL
  Administered 2014-09-01 – 2014-09-02 (×2): 2 mg via ORAL
  Filled 2014-08-26 (×3): qty 1
  Filled 2014-08-26: qty 2

## 2014-08-26 MED ORDER — SACCHAROMYCES BOULARDII 250 MG PO CAPS
250.0000 mg | ORAL_CAPSULE | Freq: Two times a day (BID) | ORAL | Status: DC
  Filled 2014-08-26 (×2): qty 1

## 2014-08-26 MED ORDER — ALUM & MAG HYDROXIDE-SIMETH 200-200-20 MG/5ML PO SUSP: 30.0000 mL | Freq: Four times a day (QID) | ORAL | Status: DC | PRN

## 2014-08-26 MED ORDER — POTASSIUM CHLORIDE CRYS ER 20 MEQ PO TBCR
40.0000 meq | EXTENDED_RELEASE_TABLET | Freq: Once | ORAL | Status: AC
  Administered 2014-08-26: 40 meq via ORAL
  Filled 2014-08-26: qty 2

## 2014-08-26 MED ORDER — PSYLLIUM 95 % PO PACK
1.0000 | PACK | Freq: Two times a day (BID) | ORAL | Status: DC
  Administered 2014-08-26 – 2014-09-05 (×11): 1 via ORAL
  Filled 2014-08-26 (×24): qty 1

## 2014-08-26 MED ORDER — SODIUM CHLORIDE 0.9 % IV SOLN
250.0000 mL | INTRAVENOUS | Status: DC | PRN
  Administered 2014-09-02: 250 mL via INTRAVENOUS
  Administered 2014-09-13: 11:00:00 via INTRAVENOUS

## 2014-08-26 MED ORDER — OXYCODONE HCL 5 MG PO TABS
5.0000 mg | ORAL_TABLET | ORAL | Status: DC | PRN
  Administered 2014-08-27 – 2014-08-30 (×4): 10 mg via ORAL
  Filled 2014-08-26 (×5): qty 2

## 2014-08-26 MED ORDER — MAGIC MOUTHWASH
15.0000 mL | Freq: Four times a day (QID) | ORAL | Status: DC | PRN
  Filled 2014-08-26: qty 15

## 2014-08-26 MED ORDER — PHENOL 1.4 % MT LIQD: 2.0000 | OROMUCOSAL | Status: DC | PRN

## 2014-08-26 MED ORDER — BISMUTH SUBSALICYLATE 262 MG/15ML PO SUSP: 30.0000 mL | Freq: Three times a day (TID) | ORAL | Status: DC | PRN

## 2014-08-26 MED ORDER — MENTHOL 3 MG MT LOZG: 1.0000 | LOZENGE | OROMUCOSAL | Status: DC | PRN

## 2014-08-26 MED ORDER — ACETAMINOPHEN 500 MG PO TABS
1000.0000 mg | ORAL_TABLET | Freq: Three times a day (TID) | ORAL | Status: DC
  Administered 2014-08-26 – 2014-09-04 (×21): 1000 mg via ORAL
  Filled 2014-08-26 (×40): qty 2

## 2014-08-26 NOTE — Evaluation (Signed)
Physical Therapy Evaluation Patient Details Name: [name] MRN: [id number] DOB: Jun 03, 1940 Today's Date: 08/26/2014   History of Present Illness  74 yo female adm with complex perirectal infection extending up toward the retroperitoneal area; pt underwent I& D on 08/23/14; PMHx:  HTN, CVA, ulcerative colitis  Clinical Impression  Pt admitted with above diagnosis. Pt currently with functional limitations due to the deficits listed below (see PT Problem List).  Pt will benefit from skilled PT to increase their independence and safety with mobility to allow discharge to the venue listed below.  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     Follow Up Recommendations SNF    Equipment Recommendations  None recommended by PT    Recommendations for Other Services       Precautions / Restrictions Precautions Precautions: Fall Restrictions Weight Bearing Restrictions: No      Mobility  Bed Mobility Overal bed mobility: Needs Assistance Bed Mobility: Supine to Sit     Supine to sit: HOB elevated;Mod assist;Max assist     General bed mobility comments: incr time, assist for LEs and trunk to come forward  Transfers Overall transfer level: Needs assistance   Transfers: Sit to/from Stand;Stand Pivot Transfers Sit to Stand: From elevated surface;Min assist;Mod assist Stand pivot transfers: From elevated surface;Min assist;Mod assist       General transfer comment: cues for hand placement and safety; incr time  Ambulation/Gait                Stairs            Wheelchair Mobility    Modified Rankin (Stroke Patients Only)       Balance                                             Pertinent Vitals/Pain Pain Assessment: Faces Faces Pain Scale: Hurts whole lot Pain Location: rectal area Pain Descriptors /  Indicators: Constant Pain Intervention(s): Limited activity within patient's tolerance;Premedicated before session;Repositioned;Monitored during session    Home Living Family/patient expects to be discharged to:: Private residence Living Arrangements: Spouse/significant other;Children             Home Equipment: Environmental consultant - 2 wheels      Prior Function Level of Independence: Independent;Independent with assistive device(s)         Comments: pt states she was independent and 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";  chart states she amb with RW     Hand Dominance        Extremity/Trunk Assessment   Upper Extremity Assessment: Defer to OT evaluation           Lower Extremity Assessment: Generalized weakness         Communication   Communication: No difficulties  Cognition Arousal/Alertness: Awake/alert Behavior During Therapy: WFL for tasks assessed/performed Overall Cognitive Status: Within Functional Limits for tasks assessed                      General Comments      Exercises        Assessment/Plan    PT Assessment Patient needs continued PT services  PT Diagnosis Difficulty walking   PT Problem List Decreased strength;Decreased activity tolerance;Decreased mobility;Pain  PT  Treatment Interventions DME instruction;Gait training;Functional mobility training;Therapeutic activities;Therapeutic exercise;Patient/family education   PT Goals (Current goals can be found in the Care Plan section) Acute Rehab PT Goals Patient Stated Goal: to get better PT Goal Formulation: With patient Time For Goal Achievement: 09/09/14 Potential to Achieve Goals: Good    Frequency Min 3X/week   Barriers to discharge        Co-evaluation               End of Session   Activity Tolerance: Patient tolerated treatment well Patient left: with call bell/phone within reach;in chair;with family/visitor present Nurse Communication: Mobility status          Time: 8110-3159 PT Time Calculation (min) (ACUTE ONLY): 26 min   Charges:   PT Evaluation $Initial PT Evaluation Tier I: 1 Procedure PT Treatments $Therapeutic Activity: 8-22 mins   PT G Codes:        Shylin Keizer Aug 27, 2014, 11:13 AM

## 2014-08-26 NOTE — Progress Notes (Addendum)
CENTRAL  SURGERY  [address] [place], [place] [address] Phone: [phone] FAX: [phone]   [name] [id number] 1940-10-02   Problem List:   Principal Problem:   Peri-rectal abscess Active Problems:   Essential hypertension   Coronary atherosclerosis   Cerebrovascular disease   Ulcerative colitis   Dehydration   4 Days Post-Op  [name] . 08/23/2014  PREOPERATIVE DX: Complex perirectal abscess  POSTOPERATIVE DX: Same  PROCEDURE: Complex incision and drainage of complex perirectal abscess   Surgeon: Odis Hollingshead   Assistants: None  There is a small draining area in the right perianal area. I made a linear incision here and a large amount of brown purulent fluid drained from the wound. I made a cruciate incision and cut one corner creating a circular defect. There is a very large perirectal cavity extending up toward the right vulva and down posteriorly. I did a digital rectal exam and did not appreciate any rectal or anal defect.   Assessment  Recovering  Plan:  Large ischio rectal abscess, right-sided  Continue broad-spectrum antibiotic for multiorganisms - antifungal coverage Wound care Mobilize out of bed if possible Begin twice a day sitz baths or hydrotherapy. Consult PT to assist. Improve pain control  Ulcerative colitis.   Recent hospitalization for flareup.  Has been on high-dose steroids. This will have negative at affect on wound healing and infection control.  NOT ON ANY STEROIDS RIGHT NOW - seems sudden.  Was only recently started - ?taper instead.  Defer to medicine.  Consider d/w GI again since complicated  Wonder if this is really Crohns disease given perirectal abscess & fistulous disease more common w crohns  Control diarrhea - bowel regimen  ?Fecal incontinence - slow diarrhea 1st  Coronary artery disease, status post CABG,   History myocardial  infarction.  History CVA  Hypertension  GERD  Family was in room then left when I did dressing change & did not come back.  RN getting pain meds  Adin Hector, M.D., F.A.C.S. Gastrointestinal and Minimally Invasive Surgery [facility] [address] [place], [place] [address] [phone] Main / Paging   08/26/2014  Subjective:  Sore Tol clears Incont to loose stool ("the liquid just comes out")   Objective:  Vital signs:  Filed Vitals:   08/25/14 0540 08/25/14 1519 08/25/14 2034 08/26/14 0500  BP: 128/84 124/60 129/70 129/63  Pulse: 78 69 83 77  Temp: 98.3 F (36.8 C) 98.2 F (36.8 C) 98.3 F (36.8 C) 98.1 F (36.7 C)  TempSrc: Oral Oral  Oral  Resp: _0 Height:      Weight:      SpO2: 98% 100% 97% 100%    Last BM Date: 08/25/14  Intake/Output   Yesterday:  07/08 0701 - 07/09 0700 In: 2687.5 [P.O.:480; I.V.:1907.5; IV Piggyback:300] Out: 2050 [Urine:2050] This shift:     Bowel function:  Flatus: y  BM: loose  Drain: n/a  Physical Exam:  General: Pt awake/alert/oriented x4 in no acute distress Eyes: PERRL, normal EOM.  Sclera clear.  No icterus Neuro: CN II-XII intact w/o focal sensory/motor deficits. Lymph: No head/neck/groin lymphadenopathy Psych:  No delerium/psychosis/paranoia.  Sad but consolable HENT: Normocephalic, Mucus membranes moist.  No thrush Neck: Supple, No tracheal deviation Chest: No chest wall pain w good excursion CV:  Pulses intact.  Regular rhythm MS: Normal AROM mjr joints.  No obvious deformity Abdomen: Soft.  Nondistended.  Nontender Rectal.  R>L perirectal wounds w old blood.  Some cellulitis but no purulence.  Very sensitive Ext:  SCDs BLE.  No mjr edema.  No cyanosis Skin: No petechiae / purpura  Results:   ABSCESS PERIRECTAL    Special Requests PATIENT ON FOLLOWING ZOYSN, VANC   Gram Stain NO WBC SEEN  NO SQUAMOUS EPITHELIAL CELLS SEEN  MODERATE GRAM  NEGATIVE RODS  RARE YEAST  RARE GRAM POSITIVE COCCI       Culture MULTIPLE ORGANISMS PRESENT, NONE PREDOMINANT  Note: NO STAPHYLOCOCCUS AUREUS ISOLATED NO GROUP A STREP (S.PYOGENES) ISOLATED  Performed at [facility]            Diagnosis 1. Colon, biopsy, descending and sigmoid - MODERATELY ACTIVE CHRONIC COLITIS, CONSISTENT WITH INFLAMMATORY BOWEL DISEASE. - THERE IS NO HISTOLOGIC EVIDENCE OF C. DIFFICILE, DYSPLASIA, OR MALIGNANCY. - SEE COMMENT. 2. Colon, biopsy, sigmoid and rectal - MODERATELY TO FOCALLY SEVERELY ACTIVE CHRONIC COLITIS, CONSISTENT WITH INFLAMMATORY BOWEL DISEASE. - THERE IS NO EVIDENCE OF C. DIFFICILE, DYSPLASIA, OR MALIGNANCY. Microscopic Comment 1. Please also see the patient's concurrent microbiology specimen. Enid Cutter MD Pathologist, Electronic Signature (Case signed 08/09/2014) Specimen [name] and Clinical Information Specimen(s) Obtained: 1. Colon, biopsy, descending and sigmoid 2. Colon, biopsy, sigmoid and rectal Specimen Clinical Information 1. ulcerative colitis, evaluate for C Diff (cm) [name] 1. Received in formalin are tan, soft tissue fragments that are submitted in toto. Number: multiple, Size: 0.2 cm smallest to 0.9 cm largest, (1 B) 2. Received in formalin are tan, soft tissue fragments that are submitted in toto. Number: multiple, Size: 0.1 cm smallest to 0.2 cm largest, (1 B) (KL:kh 08-08-14) 1 of 2 FINAL for [name], [name] ([id number]) Report signed out from the following location(s) Technical Component was performed at [facility]. [address],[place] [address].[id number]., Interpretation was performed at [facility] [facility] [address], [place], [place] [address]. CLIA #: [id number], 2 of  Labs: Results for orders placed or performed during the hospital encounter of 08/22/14 (from the past 48 hour(s))  CBC     Status: Abnormal   Collection Time: 08/25/14  9:00  AM  Result Value Ref Range   WBC 9.1 4.0 - 10.5 K/uL   RBC 3.16 (L) 3.87 - 5.11 MIL/uL   Hemoglobin 9.4 (L) 12.0 - 15.0 g/dL   HCT 30.2 (L) 36.0 - 46.0 %   MCV 95.6 78.0 - 100.0 fL   MCH 29.7 26.0 - 34.0 pg   MCHC 31.1 30.0 - 36.0 g/dL   RDW 14.2 11.5 - 15.5 %   Platelets 141 (L) 150 - 400 K/uL  Basic metabolic panel     Status: Abnormal   Collection Time: 08/25/14  9:00 AM  Result Value Ref Range   Sodium 131 (L) 135 - 145 mmol/L   Potassium 3.7 3.5 - 5.1 mmol/L   Chloride 97 (L) 101 - 111 mmol/L   CO2 28 22 - 32 mmol/L   Glucose, Bld 85 65 - 99 mg/dL   BUN 7 6 - 20 mg/dL   Creatinine, Ser 0.68 0.44 - 1.00 mg/dL   Calcium 7.4 (L) 8.9 - 10.3 mg/dL   GFR calc non Af Amer >60 >60 mL/min   GFR calc Af Amer >60 >60 mL/min    Comment: (NOTE) The eGFR has been calculated using the CKD EPI equation. This calculation has not been validated in all clinical situations. eGFR's persistently <60 mL/min signify possible Chronic Kidney Disease.  Anion gap 6 5 - 15  Vancomycin, trough     Status: None   Collection Time: 08/25/14  9:00 AM  Result Value Ref Range   Vancomycin Tr 13 10.0 - 20.0 ug/mL  CBC     Status: Abnormal   Collection Time: 08/26/14  7:25 AM  Result Value Ref Range   WBC 8.4 4.0 - 10.5 K/uL   RBC 3.12 (L) 3.87 - 5.11 MIL/uL   Hemoglobin 9.6 (L) 12.0 - 15.0 g/dL   HCT 29.5 (L) 36.0 - 46.0 %   MCV 94.6 78.0 - 100.0 fL   MCH 30.8 26.0 - 34.0 pg   MCHC 32.5 30.0 - 36.0 g/dL   RDW 14.5 11.5 - 15.5 %   Platelets 141 (L) 150 - 400 K/uL  Basic metabolic panel     Status: Abnormal   Collection Time: 08/26/14  8:13 AM  Result Value Ref Range   Sodium 131 (L) 135 - 145 mmol/L   Potassium 3.1 (L) 3.5 - 5.1 mmol/L   Chloride 96 (L) 101 - 111 mmol/L   CO2 26 22 - 32 mmol/L   Glucose, Bld 74 65 - 99 mg/dL   BUN <5 (L) 6 - 20 mg/dL   Creatinine, Ser 0.71 0.44 - 1.00 mg/dL   Calcium 7.2 (L) 8.9 - 10.3 mg/dL   GFR calc non Af Amer >60 >60 mL/min   GFR calc Af Amer >60 >60  mL/min    Comment: (NOTE) The eGFR has been calculated using the CKD EPI equation. This calculation has not been validated in all clinical situations. eGFR's persistently <60 mL/min signify possible Chronic Kidney Disease.    Anion gap 9 5 - 15    Imaging / Studies: No results found.  Medications / Allergies: per chart  Antibiotics: Anti-infectives    Start     Dose/Rate Route Frequency Ordered Stop   08/25/14 2000  vancomycin (VANCOCIN) IVPB 1000 mg/200 mL premix     1,000 mg 200 mL/hr over 60 Minutes Intravenous Every 12 hours 08/25/14 1009     08/25/14 1200  fluconazole (DIFLUCAN) tablet 400 mg     400 mg Oral Daily 08/25/14 0943     08/23/14 1000  vancomycin (VANCOCIN) IVPB 750 mg/150 ml premix  Status:  Discontinued     750 mg 150 mL/hr over 60 Minutes Intravenous Every 12 hours 08/22/14 2127 08/25/14 1009   08/23/14 0600  piperacillin-tazobactam (ZOSYN) IVPB 3.375 g     3.375 g 12.5 mL/hr over 240 Minutes Intravenous 3 times per day 08/22/14 2124     08/22/14 2130  piperacillin-tazobactam (ZOSYN) IVPB 3.375 g     3.375 g 100 mL/hr over 30 Minutes Intravenous NOW 08/22/14 2124 08/22/14 2220   08/22/14 2130  [MAR Hold]  vancomycin (VANCOCIN) 2,000 mg in sodium chloride 0.9 % 500 mL IVPB     (MAR Hold since 08/22/14 2338)   2,000 mg 250 mL/hr over 120 Minutes Intravenous NOW 08/22/14 2127 08/23/14 0008        Note: Portions of this report may have been transcribed using voice recognition software. Every effort was made to ensure accuracy; however, inadvertent computerized transcription errors may be present.   Any transcriptional errors that result from this process are unintentional.     Adin Hector, M.D., F.A.C.S. Gastrointestinal and Minimally Invasive Surgery [facility] [address] [place], [place] [address] [phone] Main / Paging   08/26/2014  CARE TEAM:  PCP: Kandice Hams,  MD  Outpatient Care Team:  Patient Care Team: Seward Carol, MD as PCP - General (Internal Medicine) Lelon Perla, MD (Cardiology)  Inpatient Treatment Team: Treatment Team: Attending Provider: Robbie Lis, MD; Consulting Physician: Nolon Nations, MD; Rounding Team: Joycelyn Das, MD; Registered Nurse: Martyn Malay, RN; Registered Nurse: Joselyn Glassman, RN; Registered Nurse: Virginia Rochester, RN; Technician: Dayton Bailiff, NT; Registered Nurse: Danie Chandler, RN; Physical Therapist: Neil Crouch, PT

## 2014-08-26 NOTE — Progress Notes (Addendum)
Patient ID: [name], female   DOB: 08/16/1940, 74 y.o.   MRN: [id number] [facility] PROGRESS NOTE  [name] NTI:[id number] DOB: October 26, 1940 DOA: 08/22/2014 PCP: Kandice Hams, MD  Brief narrative:    74 y.o. female with past medical history of dyslipidemia, hypertension, ulcerative colitis and recently hospitalized from 08/07/2014 through 08/21/2014 for ulcerative colitis flare who presented to [facility] long hospital with perianal pain started the night prior to the admission. CT scan on the admission demonstrated a complex perirectal infection extending up toward the retroperitoneal area. On her recent admission she had flexible sigmoidoscopy 08/08/2014 which demonstrated inflammatory changes from the rectum consistent with ulcerative colitis. Patient is status post incision and drainage of the large issue rectal abscess on the right side. She was started on vancomycin and Zosyn. Postprocedure she was doing fine but has developed large amount of bleed on the morning of 08/23/2014. She was transferred out of the stepdown unit 08/24/2014.  Barrier to discharge: Discharge by 08/28/2014. Need PT evaluation. Patient does not feel she is able to go home. Her husband is disabled veteran and she is not capable for taking care of him while she is going through her own medical issues.   Assessment/Plan:    Principal Problem:   Sepsis secondary to peri-rectal abscess / leukocytosis  - Sepsis criteria met on the admission with tachycardia, tachypnea, hypotension, leukocytosis. Source of infection is perirectal abscess. - Patient was started on vancomycin and Zosyn at the time of the admission. Abscess culture demonstrated yeast but no staph aureus. There are other morphologies present but no predominant. Will stop vanco today 08/26/2014. Continue zosyn.  - Surgery already started fluconazole 08/25/2014. - S/P  incision and drainage by surgery 08/23/2014  Active Problems: Acute blood loss anemia -  Likely postprocedure from incision and drainage done 08/23/2014 - Patient developed a lot of bleeding per rectum 08/24/2014 but her bleeding has stabilized.  - Continue Protonix 40 mg IV every 12 hours. - Check CBC today - She did not require blood transfusion during this hospital stay    Essential hypertension - Blood pressure controlled without BP meds    Ulcerative colitis with abscess - Recently admitted and treated for ulcerative colitis flareup. - Has had recent flexible sigmoidoscopy 08/08/2014 with findings consistent with ulcerative colitis. - She is not on steroids at this time    Thrombocytopenia - Mild, possibly from rectal bleeed - Platelets 141 - Will continue to monitor CBC daily     Hyponatremia - Likely related to acute infection, sepsis - Sodium stable, 131.    Hypokalemia - Likely from acute infection, GI losses - Supplemented     Generalized weakness  - Needs PT eval    DVT Prophylaxis  - SCD's bilaterally due to risk of bleeding   Code Status: Full.  Family Communication:  plan of care discussed with the patient Disposition Plan: Likely discharge by 08/28/2014. Needs PT evaluation for safe discharge plan. She does not feel she is able to go home. Her husband is disabled veteran and she is not capable for taking care of him while she is going through her own medical issues.   IV access:  Peripheral IV  Procedures and diagnostic studies:    Ct Abdomen Pelvis W Contrast 08/22/2014  Extensive soft tissue gas within the right gluteal fat extending into the right gluteal musculature as well as extending cranially throughout the retroperitoneum. Findings are concerning for an aggressive infectious process in the setting of retroperitoneal fasciitis.  Potential causative etiology may be distal colitis with perforation and associated abscess formation. Recommend surgical consultation and broad-spectrum antibiotic therapy.  Wall thickening of the distal transverse  and descending colon likely secondary to colitis.    Medical Consultants:  Surgery, Dr. Fanny Skates   Other Consultants:  PT evaluation    IAnti-Infectives:   Vancomycin 08/22/2014 --> 08/26/2014 Zosyn 08/22/2014 --> Fluconazole 08/25/2014 -->   Leisa Lenz, MD  [facility] Pager [phone]  Time spent in minutes: 25 minutes  If 7PM-7AM, please contact night-coverage [email or web address] Password [email or web address] 08/26/2014, 9:50 AM   LOS: 4 days    HPI/Subjective: No acute overnight events. Patient reports still seeing blood per rectum.  Objective: Filed Vitals:   08/25/14 0540 08/25/14 1519 08/25/14 2034 08/26/14 0500  BP: 128/84 124/60 129/70 129/63  Pulse: 78 69 83 77  Temp: 98.3 F (36.8 C) 98.2 F (36.8 C) 98.3 F (36.8 C) 98.1 F (36.7 C)  TempSrc: Oral Oral  Oral  Resp: 18 19 18 18   Height:      Weight:      SpO2: 98% 100% 97% 100%    Intake/Output Summary (Last 24 hours) at 08/26/14 0950 Last data filed at 08/26/14 0616  Gross per 24 hour  Intake 2687.5 ml  Output   2050 ml  Net  637.5 ml    Exam:   General:  Pt is alert, no acute distress, oriented to time, place and person   Cardiovascular: RRR, (+) S1, S2  Respiratory: bilateral air entry, no wheezing   Abdomen: Non tender, non distended abd, (+) BS  Extremities: No leg swelling, pulses palpable   Neuro: No focal deficits   Data Reviewed: Basic Metabolic Panel:  Recent Labs Lab 08/22/14 1610 08/23/14 0345 08/24/14 0350 08/25/14 0900 08/26/14 0813  NA 130* 130* 131* 131* 131*  K 4.4 4.2 3.8 3.7 3.1*  CL 94* 98* 99* 97* 96*  CO2 29 25 26 28 26   GLUCOSE 145* 153* 112* 85 74  BUN 11 12 11 7  <5*  CREATININE 0.87 0.80 0.71 0.68 0.71  CALCIUM 7.8* 7.0* 7.4* 7.4* 7.2*   Liver Function Tests:  Recent Labs Lab 08/22/14 1610 08/23/14 0345  AST 18 15  ALT 14 13*  ALKPHOS 57 46  BILITOT 0.8 0.7  PROT 5.5* 4.9*  ALBUMIN 2.3* 2.0*   No results for input(s): LIPASE, AMYLASE in the last  168 hours. No results for input(s): AMMONIA in the last 168 hours. CBC:  Recent Labs Lab 08/22/14 1610 08/23/14 0345 08/23/14 1000 08/24/14 0350 08/25/14 0900 08/26/14 0725  WBC 13.2* 14.7*  --  11.1* 9.1 8.4  NEUTROABS 11.9*  --   --   --   --   --   HGB 11.3* 10.2* 10.1* 9.3* 9.4* 9.6*  HCT 34.7* 31.2* 30.5* 29.1* 30.2* 29.5*  MCV 93.3 94.3  --  94.5 95.6 94.6  PLT 236 217  --  171 141* 141*   Cardiac Enzymes: No results for input(s): CKTOTAL, CKMB, CKMBINDEX, TROPONINI in the last 168 hours. BNP: Invalid input(s): POCBNP CBG:  Recent Labs Lab 08/23/14 0045  GLUCAP 143*   Results for orders placed or performed during the hospital encounter of 08/22/14  Culture, blood (routine x 2)     Status: None (Preliminary result)   Collection Time: 08/22/14  9:30 PM  Result Value Ref Range Status   Specimen Description BLOOD LEFT HAND  Final   Special Requests BOTTLES DRAWN AEROBIC ONLY 5CC  Final   Culture  Final    NO GROWTH 3 DAYS Performed at [facility]    Report Status PENDING  Incomplete  Anaerobic culture     Status: None (Preliminary result)   Collection Time: 08/23/14 12:03 AM  Result Value Ref Range Status   Specimen Description ABSCESS PERIRECTAL  Final   Special Requests PATIENT ON FOLLOWING ZOYSN, VANC  Final   Gram Stain   Final    NO WBC SEEN NO SQUAMOUS EPITHELIAL CELLS SEEN MODERATE GRAM NEGATIVE RODS FEW YEAST RARE GRAM POSITIVE COCCI    Culture   Final    NO ANAEROBES ISOLATED; CULTURE IN PROGRESS FOR 5 DAYS Performed at [facility]    Report Status PENDING  Incomplete  Culture, routine-abscess     Status: None   Collection Time: 08/23/14 12:03 AM  Result Value Ref Range Status   Specimen Description ABSCESS PERIRECTAL  Final   Special Requests PATIENT ON FOLLOWING ZOYSN, VANC  Final   Gram Stain   Final    NO WBC SEEN NO SQUAMOUS EPITHELIAL CELLS SEEN MODERATE GRAM NEGATIVE RODS RARE YEAST RARE GRAM POSITIVE COCCI     Culture   Final    MULTIPLE ORGANISMS PRESENT, NONE PREDOMINANT Note: NO STAPHYLOCOCCUS AUREUS ISOLATED NO GROUP A STREP (S.PYOGENES) ISOLATED Performed at [facility]    Report Status 08/26/2014 FINAL  Final  MRSA PCR Screening     Status: None   Collection Time: 08/23/14  1:32 AM  Result Value Ref Range Status   MRSA by PCR NEGATIVE NEGATIVE Final    Comment:        The GeneXpert MRSA Assay (FDA approved for NASAL specimens only), is one component of a comprehensive MRSA colonization surveillance program. It is not intended to diagnose MRSA infection nor to guide or monitor treatment for MRSA infections.   Culture, blood (routine x 2)     Status: None (Preliminary result)   Collection Time: 08/23/14  4:55 AM  Result Value Ref Range Status   Specimen Description BLOOD LEFT HAND  Final   Special Requests BOTTLES DRAWN AEROBIC ONLY 5ML  Final   Culture   Final    NO GROWTH 2 DAYS Performed at [facility]    Report Status PENDING  Incomplete       Collection Time: 08/23/14  1:32 AM  Result Value Ref Range Status   MRSA by PCR NEGATIVE NEGATIVE Final     . fluconazole  400 mg Oral Daily  . pantoprazole (PROTONIX) IV  40 mg Intravenous Q12H  . piperacillin-tazobactam (ZOSYN)  IV  3.375 g Intravenous 3 times per day  . potassium chloride  40 mEq Oral Once  . sodium chloride  3 mL Intravenous Q12H     Continuous Infusions: . sodium chloride 75 mL/hr at 08/26/14 [phone]

## 2014-08-27 DIAGNOSIS — K611 Rectal abscess: Secondary | ICD-10-CM | POA: Diagnosis present

## 2014-08-27 DIAGNOSIS — R159 Full incontinence of feces: Secondary | ICD-10-CM

## 2014-08-27 LAB — BASIC METABOLIC PANEL
Anion gap: 7 (ref 5–15)
BUN: <5 mg/dL — ABNORMAL LOW (ref 6–20)
CALCIUM: 7.3 mg/dL — AB (ref 8.9–10.3)
CO2: 29 mmol/L (ref 22–32)
Chloride: 96 mmol/L — ABNORMAL LOW (ref 101–111)
Creatinine, Ser: 0.64 mg/dL (ref 0.44–1.00)
GLUCOSE: 95 mg/dL (ref 65–99)
Potassium: 3.2 mmol/L — ABNORMAL LOW (ref 3.5–5.1)
Sodium: 132 mmol/L — ABNORMAL LOW (ref 135–145)

## 2014-08-27 LAB — CULTURE, BLOOD (ROUTINE X 2): Culture: NO GROWTH

## 2014-08-27 LAB — ANAEROBIC CULTURE: GRAM STAIN: NONE SEEN

## 2014-08-27 NOTE — Progress Notes (Signed)
[place] [name], [name] [id number] Phone: [phone] FAX: [phone]   [name] [id number] 1940-11-21   Problem List:   Principal Problem:   Abscess, perirectal s/p I&D 08/23/2014 Active Problems:   Essential hypertension   Inflammatory bowel disease (IBD) with colitis   Sepsis   Infection due to yeast   Acute blood loss anemia   Leukocytosis   Hypokalemia   Hyponatremia   General weakness   Thrombocytopenia   Fecal incontinence   5 Days Post-Op  [name] . 08/23/2014  PREOPERATIVE DX: Complex perirectal abscess  POSTOPERATIVE DX: Same  PROCEDURE: Complex incision and drainage of complex perirectal abscess   Surgeon: Odis Hollingshead   Assistants: None  There is a small draining area in the right perianal area. I made a linear incision here and a large amount of brown purulent fluid drained from the wound. I made a cruciate incision and cut one corner creating a circular defect. There is a very large perirectal cavity extending up toward the right vulva and down posteriorly. I did a digital rectal exam and did not appreciate any rectal or anal defect.   Assessment  Recovering  Plan:  Large ischio rectal abscess, right-sided  Continue broad-spectrum antibiotic for multiorganisms - antifungal coverage Wound care: mainly keep area clean and dry, does not need to be packed Mobilize out of bed if possible Sitz baths after BM's to clean area.  Cont pain control  Ulcerative colitis.   Recent hospitalization for flareup.  Needs better control of UC flare as frequent bloody BM's will slow wound healing more than anything.  Defer to medicine/GI.  Ok for steroids if needed  Wonder if this is really Crohns disease given perirectal abscess & fistulous disease more common w crohns  Control diarrhea - bowel regimen, needs better UC treatment  Fecal incontinence - slow diarrhea  1st  Coronary artery disease, status post CABG,   History myocardial infarction.  History CVA  Hypertension  GERD       08/27/2014  Subjective:  Sore Tol clears Incont to loose stool ("the liquid just comes out")   Objective:  Vital signs:  Filed Vitals:   08/26/14 0500 08/26/14 1430 08/26/14 2129 08/27/14 0500  BP: 129/63 126/66 144/78 121/75  Pulse: 77 73 89 75  Temp: 98.1 F (36.7 C) 98.1 F (36.7 C) 99.1 F (37.3 C) 98.4 F (36.9 C)  TempSrc: Oral Oral Oral Oral  Resp: _0 Height:      Weight:      SpO2: 100% 99% 96% 100%    Last BM Date: 08/26/14  Intake/Output   Yesterday:  07/09 0701 - 07/10 0700 In: 1680 [P.O.:1440; I.V.:240] Out: 2050 [Urine:1050; Drains:1000] This shift:     Bowel function:  Flatus: y  BM: loose, bloody  Drain: n/a  Physical Exam:  General: Pt awake/alert/oriented x4 in no acute distress Abdomen: Soft.  Nondistended.  Nontender Rectal.  R>L perirectal wounds w old blood.  Minimal cellulitis.  Less sore Ext:  SCDs BLE.  No mjr edema.  No cyanosis Skin: No petechiae / purpura  Results:   ABSCESS PERIRECTAL    Special Requests PATIENT ON FOLLOWING ZOYSN, VANC   Gram Stain NO WBC SEEN  NO SQUAMOUS EPITHELIAL CELLS SEEN  MODERATE GRAM NEGATIVE RODS  RARE YEAST  RARE GRAM POSITIVE COCCI       Culture MULTIPLE ORGANISMS PRESENT, NONE PREDOMINANT  Note: NO STAPHYLOCOCCUS  AUREUS ISOLATED NO GROUP A STREP (S.PYOGENES) ISOLATED  Performed at [facility]            Diagnosis 1. Colon, biopsy, descending and sigmoid - MODERATELY ACTIVE CHRONIC COLITIS, CONSISTENT WITH INFLAMMATORY BOWEL DISEASE. - THERE IS NO HISTOLOGIC EVIDENCE OF C. DIFFICILE, DYSPLASIA, OR MALIGNANCY. - SEE COMMENT. 2. Colon, biopsy, sigmoid and rectal - MODERATELY TO FOCALLY SEVERELY ACTIVE CHRONIC COLITIS, CONSISTENT WITH INFLAMMATORY BOWEL DISEASE. - THERE IS NO EVIDENCE OF C. DIFFICILE, DYSPLASIA, OR  MALIGNANCY. Microscopic Comment 1. Please also see the patient's concurrent microbiology specimen. Enid Cutter MD Pathologist, Electronic Signature (Case signed 08/09/2014) Specimen Gross and Clinical Information Specimen(s) Obtained: 1. Colon, biopsy, descending and sigmoid 2. Colon, biopsy, sigmoid and rectal Specimen Clinical Information 1. ulcerative colitis, evaluate for C Diff (cm) Gross 1. Received in formalin are tan, soft tissue fragments that are submitted in toto. Number: multiple, Size: 0.2 cm smallest to 0.9 cm largest, (1 B) 2. Received in formalin are tan, soft tissue fragments that are submitted in toto. Number: multiple, Size: 0.1 cm smallest to 0.2 cm largest, (1 B) (KL:kh 08-08-14) 1 of 2 FINAL for [name], [name] ([id number]) Report signed out from the following location(s) Technical Component was performed at [facility]. [address],[place] [address].[id number]., Interpretation was performed at [address] [place] [address], [place], [place] [address]. CLIA #: [id number], 2 of  Labs: Results for orders placed or performed during the hospital encounter of 08/22/14 (from the past 48 hour(s))  CBC     Status: Abnormal   Collection Time: 08/26/14  7:25 AM  Result Value Ref Range   WBC 8.4 4.0 - 10.5 K/uL   RBC 3.12 (L) 3.87 - 5.11 MIL/uL   Hemoglobin 9.6 (L) 12.0 - 15.0 g/dL   HCT 29.5 (L) 36.0 - 46.0 %   MCV 94.6 78.0 - 100.0 fL   MCH 30.8 26.0 - 34.0 pg   MCHC 32.5 30.0 - 36.0 g/dL   RDW 14.5 11.5 - 15.5 %   Platelets 141 (L) 150 - 400 K/uL  Basic metabolic panel     Status: Abnormal   Collection Time: 08/26/14  8:13 AM  Result Value Ref Range   Sodium 131 (L) 135 - 145 mmol/L   Potassium 3.1 (L) 3.5 - 5.1 mmol/L   Chloride 96 (L) 101 - 111 mmol/L   CO2 26 22 - 32 mmol/L   Glucose, Bld 74 65 - 99 mg/dL   BUN <5 (L) 6 - 20 mg/dL   Creatinine, Ser 0.71 0.44 - 1.00 mg/dL   Calcium 7.2 (L) 8.9 -  10.3 mg/dL   GFR calc non Af Amer >60 >60 mL/min   GFR calc Af Amer >60 >60 mL/min    Comment: (NOTE) The eGFR has been calculated using the CKD EPI equation. This calculation has not been validated in all clinical situations. eGFR's persistently <60 mL/min signify possible Chronic Kidney Disease.    Anion gap 9 5 - 15  Basic metabolic panel     Status: Abnormal   Collection Time: 08/27/14  7:56 AM  Result Value Ref Range   Sodium 132 (L) 135 - 145 mmol/L   Potassium 3.2 (L) 3.5 - 5.1 mmol/L   Chloride 96 (L) 101 - 111 mmol/L   CO2 29 22 - 32 mmol/L   Glucose, Bld 95 65 - 99 mg/dL   BUN <5 (L) 6 - 20 mg/dL   Creatinine, Ser 0.64 0.44 - 1.00  mg/dL   Calcium 7.3 (L) 8.9 - 10.3 mg/dL   GFR calc non Af Amer >60 >60 mL/min   GFR calc Af Amer >60 >60 mL/min    Comment: (NOTE) The eGFR has been calculated using the CKD EPI equation. This calculation has not been validated in all clinical situations. eGFR's persistently <60 mL/min signify possible Chronic Kidney Disease.    Anion gap 7 5 - 15    Imaging / Studies: No results found.  Medications / Allergies: per chart  Antibiotics: Anti-infectives    Start     Dose/Rate Route Frequency Ordered Stop   08/25/14 2000  vancomycin (VANCOCIN) IVPB 1000 mg/200 mL premix  Status:  Discontinued     1,000 mg 200 mL/hr over 60 Minutes Intravenous Every 12 hours 08/25/14 1009 08/26/14 0949   08/25/14 1200  fluconazole (DIFLUCAN) tablet 400 mg     400 mg Oral Daily 08/25/14 0943     08/23/14 1000  vancomycin (VANCOCIN) IVPB 750 mg/150 ml premix  Status:  Discontinued     750 mg 150 mL/hr over 60 Minutes Intravenous Every 12 hours 08/22/14 2127 08/25/14 1009   08/23/14 0600  piperacillin-tazobactam (ZOSYN) IVPB 3.375 g     3.375 g 12.5 mL/hr over 240 Minutes Intravenous 3 times per day 08/22/14 2124     08/22/14 2130  piperacillin-tazobactam (ZOSYN) IVPB 3.375 g     3.375 g 100 mL/hr over 30 Minutes Intravenous NOW 08/22/14 2124  08/22/14 2220   08/22/14 2130  [MAR Hold]  vancomycin (VANCOCIN) 2,000 mg in sodium chloride 0.9 % 500 mL IVPB     (MAR Hold since 08/22/14 2338)   2,000 mg 250 mL/hr over 120 Minutes Intravenous NOW 08/22/14 2127 08/23/14 0008        08/27/2014  CARE TEAM:  PCP: Kandice Hams, MD  Outpatient Care Team: Patient Care Team: Seward Carol, MD as PCP - General (Internal Medicine) Lelon Perla, MD (Cardiology) Inda Castle, MD as Consulting Physician (Gastroenterology)  Inpatient Treatment Team: Treatment Team: Attending Provider: Robbie Lis, MD; Consulting Physician: Nolon Nations, MD; Rounding Team: Joycelyn Das, MD; Registered Nurse: Martyn Malay, RN; Registered Nurse: Joselyn Glassman, RN; Registered Nurse: Virginia Rochester, RN; Technician: Dayton Bailiff, NT; Registered Nurse: Candie Chroman, RN; Registered Nurse: Lolita Rieger, RN; Physical Therapist: Ernesta Amble, PT

## 2014-08-27 NOTE — Progress Notes (Signed)
PT HYDRO THERAPY EVALUATION   08/27/14 1208  Subjective Assessment  Subjective "honey, you do whatever it takes to get me better".  Patient and Family Stated Goals I want to be able to go to my family's [place] again [place] the beach, I am missing it this year.   Date of Onset 08/27/14  Prior Treatments Pt in with flare up of ulverative colitis with pain and diarrhea, and pain in perirectal area. I &D on 08/23/2014 for drainage and opening of perirectal abscess. Penrose drains were in place earlier this week, but have been removed by surgery and hydrotherapy consult put in place.   Evaluation and Treatment  Evaluation and Treatment Procedures Explained to Patient/Family Yes  Evaluation and Treatment Procedures agreed to  [REMOVED] Wound / Incision (Open or Dehisced) 08/27/14 Other (Comment) Rectum Right Right Perirectal wound (HYDRO)  Final Assessment Date/Final Assessment Time: 08/27/14 1200  Date First Assessed/Time First Assessed: 08/27/14 1100   Wound Type: Other (Comment)  Location: Rectum  Location Orientation: Right  Wound Description (Comments): Right Perirectal wound (HYDR...  Dressing Type ABD (nurse consulted with surgery and asked what dressing or loose packing should be in place, and they requested dry ABD pad to drainage and not packing)   Dressing Changed Changed  Dressing Status Dry  Dressing Change Frequency PRN  Site / Wound Assessment Red;Brown;Bleeding;Yellow  % Wound base Red or Granulating 90%  % Wound base Other (Comment) 10% (unknown, hard to visualize due to depth and tunnelling of wo)  Peri-wound Assessment Induration;Intact  Wound Length (cm) 5 cm (at longest area) not regular edges , more angular at top and rounded at bottom  Wound Width (cm) 5 cm (at widest area)  Wound Depth (cm) 5 cm in areas  Tunneling (cm) 5 (noted at least 2 different areas for tunneling with no definite end field)  Margins Unattached edges (unapproximated)  Closure None  Drainage Amount  Moderate  Drainage Description Purulent;Sanguineous  Non-staged Wound Description Full thickness  Treatment Cleansed;Debridement (Selective);Hydrotherapy (Pulse lavage) (used forceps to pull some brown/yllow/red fibrin tissue out )  Hydrotherapy  Pulsed lavage therapy - wound location right perirectal   Pulsed Lavage with Suction (psi) 8 psi  Pulsed Lavage with Suction - Normal Saline Used 1000 mL  Pulsed Lavage Tip Tip with splash shield (pulled back tip shield to enter into wound more)  Selective Debridement  Selective Debridement - Location deep in wound  Selective Debridement - Tools Used Forceps  Selective Debridement - Tissue Removed yellow/brown fibrin tissue  Wound Therapy - Assess/Plan/Recommendations  Wound Therapy - Clinical Statement Pt with perirectal abscess that has have an I&D on 08/23/2014 and remain open and draining. Hard to visialize wound bed due to depth and location however, drainage and some fibrin sloughing (brownish yellow) was present during procedure. Would benefit from continued hydrotherapy in order to help with 100% granulation and healing to this wound.     Wound Therapy - Functional Problem List deconditioning and unhealing wound  Factors Delaying/Impairing Wound Healing Incontinence;Multiple medical problems  Hydrotherapy Plan Debridement;Dressing change;Patient/family education;Pulsatile lavage with suction  Wound Therapy - Frequency 6X / week (no hydrotherapy on Sunday, nursing to continue with dressing)  Wound Therapy - Current Recommendations PT;Surgery consult;OT  Wound Therapy - Follow Up Recommendations Skilled nursing facility  Wound Plan will continue with PLS while pt here and needed.   Wound Therapy Goals - Improve the function of patient's integumentary system by progressing the wound(s) through the phases of wound healing by:  Decrease Necrotic Tissue to 0  Decrease Necrotic Tissue - Progress Goal set today  Increase Granulation Tissue to 100   Increase Granulation Tissue - Progress Goal set today  Improve Drainage Characteristics Min  Improve Drainage Characteristics - Progress Goal set today  Patient/Family will be able to  recongize symptoms aof progress or not and help assist with dressing changes as needed.  Patient/Family Instruction Goal - Progress Goal set today  Goals/treatment plan/discharge plan were made with and agreed upon by patient/family Yes  Time For Goal Achievement 2 weeks  Wound Therapy - Potential for Goals Good  Clide Dales, PT Pager: [phone] 08/27/2014

## 2014-08-27 NOTE — Progress Notes (Signed)
Patient ID: [name], female   DOB: Feb 04, 1941, 74 y.o.   MRN: [id number] [facility] PROGRESS NOTE  [name] HYW:[id number] DOB: Jul 02, 1940 DOA: 08/22/2014 PCP: Kandice Hams, MD  Brief narrative:    74 y.o. female with past medical history of dyslipidemia, hypertension, ulcerative colitis and recently hospitalized from 08/07/2014 through 08/21/2014 for ulcerative colitis flare who presented to [facility] long hospital with perianal pain started the night prior to the admission. CT scan on the admission demonstrated a complex perirectal infection extending up toward the retroperitoneal area. On her recent admission she had flexible sigmoidoscopy 08/08/2014 which demonstrated inflammatory changes from the rectum consistent with ulcerative colitis. Patient is status post incision and drainage of the large issue rectal abscess on the right side. She was started on vancomycin and Zosyn. Postprocedure she was doing fine but has developed large amount of bleed on the morning of 08/23/2014. She was transferred out of the stepdown unit 08/24/2014.  Barrier to discharge: Discharge to [facility] 08/28/2014.  Assessment/Plan:    Principal Problem:   Sepsis secondary to peri-rectal abscess / leukocytosis  - Sepsis criteria met on the admission with tachycardia, tachypnea, hypotension, leukocytosis. Source of infection is perirectal abscess. - S/P  incision and drainage by surgery 08/23/2014 - Patient was started on vancomycin and Zosyn at the time of the admission. Abscess culture demonstrated yeast but no staph aureus. There are other morphologies present but no predominant.  - Stopped vanco 08/26/2014. Continue zosyn.  - Continue fluconazole (Started 08/25/2014) since yeast detected on wound culture.  Active Problems: Acute blood loss anemia - Likely postprocedure from incision and drainage done 08/23/2014 - Patient developed a lot of bleeding per rectum 08/24/2014 but her bleeding has stabilized.  -  Continue Protonix 40 mg every 12 hours. - Hemoglobin stable at 9.6    Essential hypertension - Blood pressure controlled without BP meds    Ulcerative colitis with abscess - Recently admitted and treated for ulcerative colitis flareup. - Has had recent flexible sigmoidoscopy 08/08/2014 with findings consistent with ulcerative colitis. - She is not on steroids at this time    Thrombocytopenia - Mild, possibly from rectal bleeed - Platelets stable     Hyponatremia - Likely related to acute infection, sepsis - Sodium stable, 132    Hypokalemia - Likely from acute infection, GI losses - Supplemented     Generalized weakness  - To SNF per PT evaluation.    DVT Prophylaxis  - SCD's bilaterally due to risk of bleeding   Code Status: Full.  Family Communication:  plan of care discussed with the patient Disposition Plan: D/C to [facility] 08/28/2014.  IV access:  Peripheral IV  Procedures and diagnostic studies:    Ct Abdomen Pelvis W Contrast 08/22/2014  Extensive soft tissue gas within the right gluteal fat extending into the right gluteal musculature as well as extending cranially throughout the retroperitoneum. Findings are concerning for an aggressive infectious process in the setting of retroperitoneal fasciitis. Potential causative etiology may be distal colitis with perforation and associated abscess formation. Recommend surgical consultation and broad-spectrum antibiotic therapy.  Wall thickening of the distal transverse and descending colon likely secondary to colitis.    Medical Consultants:  Surgery, Dr. Fanny Skates   Other Consultants:  PT evaluation    IAnti-Infectives:   Vancomycin 08/22/2014 --> 08/26/2014 Zosyn 08/22/2014 --> Fluconazole 08/25/2014 -->   [name], MD  [facility] Pager [phone]  Time spent in minutes: 15 minutes  If 7PM-7AM, please contact night-coverage  [email or web address] Password [email or web address] 08/27/2014, 7:47 AM   LOS: 5 days     HPI/Subjective: No acute overnight events. Patient reports no blood pre rectum.   Objective: Filed Vitals:   08/26/14 0500 08/26/14 1430 08/26/14 2129 08/27/14 0500  BP: 129/63 126/66 144/78 121/75  Pulse: 77 73 89 75  Temp: 98.1 F (36.7 C) 98.1 F (36.7 C) 99.1 F (37.3 C) 98.4 F (36.9 C)  TempSrc: Oral Oral Oral Oral  Resp: _0 Height:      Weight:      SpO2: 100% 99% 96% 100%    Intake/Output Summary (Last 24 hours) at 08/27/14 0747 Last data filed at 08/27/14 0600  Gross per 24 hour  Intake   1680 ml  Output   2050 ml  Net   -370 ml    Exam:   General:  Pt is alert, no distress  Cardiovascular: Rate controlled, appreciate S1, S2  Respiratory: clear to auscultation, no wheezing   Abdomen: appreciate bowel sounds, no wheezing   Extremities: No LE edema, palpable pulses   Neuro: Non focal    Data Reviewed: Basic Metabolic Panel:  Recent Labs Lab 08/22/14 1610 08/23/14 0345 08/24/14 0350 08/25/14 0900 08/26/14 0813  NA 130* 130* 131* 131* 131*  K 4.4 4.2 3.8 3.7 3.1*  CL 94* 98* 99* 97* 96*  CO2 _1 GLUCOSE 145* 153* 112* 85 74  BUN _2 <5*  CREATININE 0.87 0.80 0.71 0.68 0.71  CALCIUM 7.8* 7.0* 7.4* 7.4* 7.2*   Liver Function Tests:  Recent Labs Lab 08/22/14 1610 08/23/14 0345  AST 18 15  ALT 14 13*  ALKPHOS 57 46  BILITOT 0.8 0.7  PROT 5.5* 4.9*  ALBUMIN 2.3* 2.0*   No results for input(s): LIPASE, AMYLASE in the last 168 hours. No results for input(s): AMMONIA in the last 168 hours. CBC:  Recent Labs Lab 08/22/14 1610 08/23/14 0345 08/23/14 1000 08/24/14 0350 08/25/14 0900 08/26/14 0725  WBC 13.2* 14.7*  --  11.1* 9.1 8.4  NEUTROABS 11.9*  --   --   --   --   --   HGB 11.3* 10.2* 10.1* 9.3* 9.4* 9.6*  HCT 34.7* 31.2* 30.5* 29.1* 30.2* 29.5*  MCV 93.3 94.3  --  94.5 95.6 94.6  PLT 236 217  --  171 141* 141*   Cardiac Enzymes: No results for input(s): CKTOTAL, CKMB, CKMBINDEX, TROPONINI  in the last 168 hours. BNP: Invalid input(s): POCBNP CBG:  Recent Labs Lab 08/23/14 0045  GLUCAP 143*   Results for orders placed or performed during the hospital encounter of 08/22/14  Culture, blood (routine x 2)     Status: None (Preliminary result)   Collection Time: 08/22/14  9:30 PM  Result Value Ref Range Status   Specimen Description BLOOD LEFT HAND  Final   Special Requests BOTTLES DRAWN AEROBIC ONLY 5CC  Final   Culture   Final    NO GROWTH 4 DAYS Performed at [facility]    Report Status PENDING  Incomplete  Anaerobic culture     Status: None (Preliminary result)   Collection Time: 08/23/14 12:03 AM  Result Value Ref Range Status   Specimen Description ABSCESS PERIRECTAL  Final   Special Requests PATIENT ON FOLLOWING ZOYSN, VANC  Final   Gram Stain   Final    NO WBC SEEN NO SQUAMOUS EPITHELIAL CELLS SEEN MODERATE GRAM NEGATIVE RODS FEW YEAST RARE GRAM POSITIVE COCCI  Culture   Final    NO ANAEROBES ISOLATED; CULTURE IN PROGRESS FOR 5 DAYS Performed at [facility]    Report Status PENDING  Incomplete  Culture, routine-abscess     Status: None   Collection Time: 08/23/14 12:03 AM  Result Value Ref Range Status   Specimen Description ABSCESS PERIRECTAL  Final   Special Requests PATIENT ON FOLLOWING ZOYSN, VANC  Final   Gram Stain   Final    NO WBC SEEN NO SQUAMOUS EPITHELIAL CELLS SEEN MODERATE GRAM NEGATIVE RODS RARE YEAST RARE GRAM POSITIVE COCCI    Culture   Final    MULTIPLE ORGANISMS PRESENT, NONE PREDOMINANT Note: NO STAPHYLOCOCCUS AUREUS ISOLATED NO GROUP A STREP (S.PYOGENES) ISOLATED Performed at [facility]    Report Status 08/26/2014 FINAL  Final  MRSA PCR Screening     Status: None   Collection Time: 08/23/14  1:32 AM  Result Value Ref Range Status   MRSA by PCR NEGATIVE NEGATIVE Final    Comment:        The GeneXpert MRSA Assay (FDA approved for NASAL specimens only), is one component of a comprehensive  MRSA colonization surveillance program. It is not intended to diagnose MRSA infection nor to guide or monitor treatment for MRSA infections.   Culture, blood (routine x 2)     Status: None (Preliminary result)   Collection Time: 08/23/14  4:55 AM  Result Value Ref Range Status   Specimen Description BLOOD LEFT HAND  Final   Special Requests BOTTLES DRAWN AEROBIC ONLY 5ML  Final   Culture   Final    NO GROWTH 3 DAYS Performed at [facility]    Report Status PENDING  Incomplete       Collection Time: 08/23/14  1:32 AM  Result Value Ref Range Status   MRSA by PCR NEGATIVE NEGATIVE Final     . acetaminophen  1,000 mg Oral TID  . fluconazole  400 mg Oral Daily  . lip balm  1 application Topical BID  . piperacillin-tazobactam (ZOSYN)  IV  3.375 g Intravenous 3 times per day  . psyllium  1 packet Oral BID  . saccharomyces boulardii  250 mg Oral BID  . sodium chloride  3 mL Intravenous Q12H  . sodium chloride  3 mL Intravenous Q12H     Continuous Infusions:

## 2014-08-27 NOTE — Clinical Social Work Note (Signed)
Clinical Social Work Assessment  Patient Details  Name: [name] MRN: [id number] Date of Birth: [birth year]  Date of referral:  08/27/14               Reason for consult:  Facility Placement                Permission sought to share information with:  Facility Sport and exercise psychologist, Family Supports Permission granted to share information::  Yes, Verbal Permission Granted  Name::        Agency::     Relationship::     Contact Information:     Housing/Transportation Living arrangements for the past 2 months:  Single Family Home Source of Information:  Patient, Adult Children Patient Interpreter Needed:  None Criminal Activity/Legal Involvement Pertinent to Current Situation/Hospitalization:    Significant Relationships:  Adult Children Lives with:  Spouse Do you feel safe going back to the place where you live?    Need for family participation in patient care:  Yes (Comment)  Care giving concerns:  None reported   Facilities manager / plan:  CSW met with pt at bedside to assess for discharge services.  CSW prompted pt to discuss history and needs.  CSW explained role and protocol of obtaining a rehab bed at discharge.  CSW encouraged pt to explore thoughts and feelings related to her health and her rehab needs.  CSW provided active and supportive listening.  CSW will send pt information to SNF's in [place] area.  CSW called and spoke with pt's daughter who confirmed that pt wants [facility] at discharge  Employment status:  Retired Forensic scientist:  Managed Care PT Recommendations:  [place] / Referral to community resources:     Patient/Family's Response to care:  Pt stated that she has not been to rehab in over 20 years.  Pt stated that she would like to go to [facility] for her rehab needs.  Pt asked that CSW call and speak to her daughter.  Patient/Family's Understanding of and Emotional Response to Diagnosis, Current Treatment, and  Prognosis:  Pt appears to understand her diagnoses and need for rehab  Emotional Assessment Appearance:  Appears younger than stated age Attitude/Demeanor/Rapport:  Lethargic Affect (typically observed):  Accepting Orientation:  Oriented to Self, Oriented to Place, Oriented to  Time, Oriented to Situation Alcohol / Substance use:    Psych involvement (Current and /or in the community):  No (Comment)  Discharge Needs  Concerns to be addressed:    Readmission within the last 30 days:    Current discharge risk:    Barriers to Discharge:  No Barriers Identified   Carlean Jews, LCSW 08/27/2014, 4:42 PM

## 2014-08-28 DIAGNOSIS — K51911 Ulcerative colitis, unspecified with rectal bleeding: Secondary | ICD-10-CM | POA: Diagnosis present

## 2014-08-28 DIAGNOSIS — E876 Hypokalemia: Secondary | ICD-10-CM

## 2014-08-28 LAB — BASIC METABOLIC PANEL
ANION GAP: 8 (ref 5–15)
BUN: <5 mg/dL — ABNORMAL LOW (ref 6–20)
CHLORIDE: 93 mmol/L — AB (ref 101–111)
CO2: 30 mmol/L (ref 22–32)
CREATININE: 0.61 mg/dL (ref 0.44–1.00)
Calcium: 7.4 mg/dL — ABNORMAL LOW (ref 8.9–10.3)
GLUCOSE: 77 mg/dL (ref 65–99)
Potassium: 3.3 mmol/L — ABNORMAL LOW (ref 3.5–5.1)
SODIUM: 131 mmol/L — AB (ref 135–145)

## 2014-08-28 LAB — CBC
HCT: 30.4 % — ABNORMAL LOW (ref 36.0–46.0)
Hemoglobin: 9.7 g/dL — ABNORMAL LOW (ref 12.0–15.0)
MCH: 31 pg (ref 26.0–34.0)
MCHC: 31.9 g/dL (ref 30.0–36.0)
MCV: 97.1 fL (ref 78.0–100.0)
PLATELETS: 158 10*3/uL (ref 150–400)
RBC: 3.13 MIL/uL — AB (ref 3.87–5.11)
RDW: 14.5 % (ref 11.5–15.5)
WBC: 9.6 10*3/uL (ref 4.0–10.5)

## 2014-08-28 LAB — GLUCOSE, CAPILLARY
GLUCOSE-CAPILLARY: 56 mg/dL — AB (ref 65–99)
GLUCOSE-CAPILLARY: 88 mg/dL (ref 65–99)
Glucose-Capillary: 60 mg/dL — ABNORMAL LOW (ref 65–99)
Glucose-Capillary: 66 mg/dL (ref 65–99)

## 2014-08-28 LAB — CULTURE, BLOOD (ROUTINE X 2): CULTURE: NO GROWTH

## 2014-08-28 LAB — CLOSTRIDIUM DIFFICILE BY PCR: Toxigenic C. Difficile by PCR: NEGATIVE

## 2014-08-28 MED ORDER — PSYLLIUM 95 % PO PACK: 1.0000 | PACK | Freq: Two times a day (BID) | ORAL | Status: DC

## 2014-08-28 MED ORDER — FLUCONAZOLE 200 MG PO TABS: 400.0000 mg | ORAL_TABLET | Freq: Every day | ORAL | Status: DC

## 2014-08-28 MED ORDER — PREDNISONE 20 MG PO TABS
20.0000 mg | ORAL_TABLET | Freq: Every day | ORAL | Status: DC
  Administered 2014-08-28 – 2014-08-31 (×3): 20 mg via ORAL
  Filled 2014-08-28 (×4): qty 1

## 2014-08-28 MED ORDER — ACETAMINOPHEN 500 MG PO TABS: 1000.0000 mg | ORAL_TABLET | ORAL | Status: DC | PRN

## 2014-08-28 MED ORDER — OXYCODONE HCL 5 MG PO TABS: 5.0000 mg | ORAL_TABLET | ORAL | Status: DC | PRN

## 2014-08-28 MED ORDER — MESALAMINE 1.2 G PO TBEC
4.8000 g | DELAYED_RELEASE_TABLET | Freq: Every day | ORAL | Status: DC
  Administered 2014-08-28 – 2014-09-19 (×19): 4.8 g via ORAL
  Filled 2014-08-28 (×26): qty 4

## 2014-08-28 MED ORDER — AMOXICILLIN-POT CLAVULANATE 875-125 MG PO TABS: 1.0000 | ORAL_TABLET | Freq: Two times a day (BID) | ORAL | Status: DC

## 2014-08-28 MED ORDER — POTASSIUM CHLORIDE CRYS ER 20 MEQ PO TBCR
40.0000 meq | EXTENDED_RELEASE_TABLET | Freq: Once | ORAL | Status: AC
  Administered 2014-08-28: 40 meq via ORAL
  Filled 2014-08-28: qty 2

## 2014-08-28 NOTE — Progress Notes (Signed)
Orange juice and peanut butter and crackers given to patient.

## 2014-08-28 NOTE — Progress Notes (Addendum)
Patient drinking another cup of orange juice.

## 2014-08-28 NOTE — Progress Notes (Signed)
Clinical Social Work  Patient was discussed during progression meeting and MD reports patient can DC today. No bed offers at this time due to hydrotherapy needs. CSW text paged MD to determine if hydrotherapy will be continued at SNF. CSW will continue to follow.  [place], [place] [phone]

## 2014-08-28 NOTE — Progress Notes (Signed)
PT HYDRO TREATMENT     08/28/14 1415  Subjective Assessment  Subjective "honey, you do whatever it takes to get me better".  Patient and Family Stated Goals I want to be able to go to [place], I am missing it this year.   Date of Onset 08/2014  Prior Treatments Pt in with flare up of ulverative colitis with pain and diarrhea, and pain in perirectal area. I &D on 08/23/2014 for drainage and opening of perirectal abscess. Penrose drains were in place earlier this week, but have been removed by surgery and hydrotherapy consult put in place.   Evaluation and Treatment  Evaluation and Treatment Procedures Explained to Patient/Family Yes  Evaluation and Treatment Procedures agreed to  [REMOVED] Wound / Incision (Open or Dehisced) 08/27/14 Other (Comment) Rectum Right Right Perirectal wound (HYDRO)  Final Assessment Date/Final Assessment Time: 08/27/14 1200  Date First Assessed/Time First Assessed: 08/27/14 1100   Wound Type: Other (Comment)  Location: Rectum  Location Orientation: Right  Wound Description (Comments): Right Perirectal wound (HYDR...  Dressing Type ABD  Dressing Changed Changed  Dressing Status Dry  Dressing Change Frequency PRN  Site / Wound Assessment Red;Brown;Bleeding;Yellow  % Wound base Red or Granulating 70%   (more yellow in color today than yesterday 08/27/2014)  % Wound base Other (Comment) 30% (unknown, hard to visualize due to depth and tunnelling of wound  Peri-wound Assessment Induration;Intact  Wound Length (cm) 5 cm  Wound Width (cm) 5 cm  Wound Depth (cm) 5 cm  Tunneling (cm) 5  Margins Unattached edges (unapproximated)  Closure None  Drainage Amount Moderate  Drainage Description Purulent;Sanguineous  Non-staged Wound Description Full thickness  Hydrotherapy  Pulsed lavage therapy - wound location right perirectal   Pulsed Lavage with Suction (psi) 8 psi-12psi  Very painful for patient. Pt tolerated and monitored well and was  medicated by RN .   Pulsed Lavage with Suction - Normal Saline Used 1000 mL  Pulsed Lavage Tip Tip with splash shield   Selective Debridement  Selective Debridement - Location deep in wound  Selective Debridement - Tools Used Forceps  Selective Debridement - Tissue Removed yellow/brown fibrin/slough tissue  Wound Therapy - Assess/Plan/Recommendations  Wound Therapy - Clinical Statement Pt with perirectal abscess taht has have an I&D on 08/23/2014 and remain open and draining. Hard to visialize wound bed due to depth and location however, drainage and some fibrin sloughing was present during procedure. Would benefit from contiued hydrotherapy in order to help with 100% granulation and healing to this wound. Today 08/28/2014, seemed a little less red tissue , and more with yellow slough and color in places. Will try to page surgery while performing pulsed lavage tomorrow to view together.     Wound Therapy - Functional Problem List deconditioning and unhealing wound  Factors Delaying/Impairing Wound Healing Incontinence;Multiple medical problems  Hydrotherapy Plan Debridement;Dressing change;Patient/family education;Pulsatile lavage with suction  Wound Therapy - Frequency 6X / week (no hydrotherapy on Sunday, nursing to continue with dressing)  Wound Therapy - Current Recommendations PT;Surgery consult;OT  Wound Therapy - Follow Up Recommendations Skilled nursing facility  Wound Plan will continue with PLS while pt here and needed.   Wound Therapy Goals - Improve the function of patient's integumentary system by progressing the wound(s) through the phases of wound healing by:  Decrease Necrotic Tissue to 0  Increase Granulation Tissue to 100  Improve Drainage Characteristics Min  Patient/Family will be able to  recongize symptoms aof progress  or not and help assist with dressing changes as needed.  Goals/treatment plan/discharge plan were made with and agreed upon by patient/family Yes  Time For  Goal Achievement 2 weeks  Wound Therapy - Potential for Goals Good    [name], PT Pager: [phone] 08/28/2014

## 2014-08-28 NOTE — Progress Notes (Signed)
Patient ID: [name], female   DOB: 02/27/1940, 74 y.o.   MRN: [id number]     [address] [place], [place] [address]    Phone: [phone] FAX: [phone]     Subjective: Bloody stools.    Objective:  Vital signs:  Filed Vitals:   08/27/14 0500 08/27/14 1507 08/27/14 2157 08/28/14 0515  BP: 121/75 129/68 110/87 124/60  Pulse: 75 84 79 90  Temp: 98.4 F (36.9 C) 98.1 F (36.7 C) 98.1 F (36.7 C) 97.8 F (36.6 C)  TempSrc: Oral Oral Oral Oral  Resp: _0 Height:      Weight:      SpO2: 100% 99% 100% 93%    Last BM Date: 08/28/14  Intake/Output   Yesterday:  07/10 0701 - 07/11 0700 In: 360 [P.O.:360] Out: 450 [Urine:450] This shift:     Physical Exam: General: Pt awake/alert/oriented x4 in no acute distress  Skin: small wound is open, no induration or erythema, being contaminated by bloody stools.     Problem List:   Principal Problem:   Abscess, perirectal s/p I&D 08/23/2014 Active Problems:   Essential hypertension   Inflammatory bowel disease (IBD) with colitis   Sepsis   Infection due to yeast   Acute blood loss anemia   Leukocytosis   Hypokalemia   Hyponatremia   General weakness   Thrombocytopenia   Fecal incontinence    Results:   Labs: Results for orders placed or performed during the hospital encounter of 08/22/14 (from the past 48 hour(s))  Basic metabolic panel     Status: Abnormal   Collection Time: 08/27/14  7:56 AM  Result Value Ref Range   Sodium 132 (L) 135 - 145 mmol/L   Potassium 3.2 (L) 3.5 - 5.1 mmol/L   Chloride 96 (L) 101 - 111 mmol/L   CO2 29 22 - 32 mmol/L   Glucose, Bld 95 65 - 99 mg/dL   BUN <5 (L) 6 - 20 mg/dL   Creatinine, Ser 0.64 0.44 - 1.00 mg/dL   Calcium 7.3 (L) 8.9 - 10.3 mg/dL   GFR calc non Af Amer >60 >60 mL/min   GFR calc Af Amer >60 >60 mL/min    Comment: (NOTE) The eGFR has been calculated using the CKD EPI  equation. This calculation has not been validated in all clinical situations. eGFR's persistently <60 mL/min signify possible Chronic Kidney Disease.    Anion gap 7 5 - 15  Basic metabolic panel     Status: Abnormal   Collection Time: 08/28/14  4:40 AM  Result Value Ref Range   Sodium 131 (L) 135 - 145 mmol/L   Potassium 3.3 (L) 3.5 - 5.1 mmol/L   Chloride 93 (L) 101 - 111 mmol/L   CO2 30 22 - 32 mmol/L   Glucose, Bld 77 65 - 99 mg/dL   BUN <5 (L) 6 - 20 mg/dL   Creatinine, Ser 0.61 0.44 - 1.00 mg/dL   Calcium 7.4 (L) 8.9 - 10.3 mg/dL   GFR calc non Af Amer >60 >60 mL/min   GFR calc Af Amer >60 >60 mL/min    Comment: (NOTE) The eGFR has been calculated using the CKD EPI equation. This calculation has not been validated in all clinical situations. eGFR's persistently <60 mL/min signify possible Chronic Kidney Disease.    Anion gap 8 5 - 15  CBC     Status: Abnormal  Collection Time: 08/28/14  4:40 AM  Result Value Ref Range   WBC 9.6 4.0 - 10.5 K/uL   RBC 3.13 (L) 3.87 - 5.11 MIL/uL   Hemoglobin 9.7 (L) 12.0 - 15.0 g/dL   HCT 30.4 (L) 36.0 - 46.0 %   MCV 97.1 78.0 - 100.0 fL   MCH 31.0 26.0 - 34.0 pg   MCHC 31.9 30.0 - 36.0 g/dL   RDW 14.5 11.5 - 15.5 %   Platelets 158 150 - 400 K/uL  Glucose, capillary     Status: Abnormal   Collection Time: 08/28/14  8:11 AM  Result Value Ref Range   Glucose-Capillary 60 (L) 65 - 99 mg/dL  Glucose, capillary     Status: Abnormal   Collection Time: 08/28/14  8:43 AM  Result Value Ref Range   Glucose-Capillary 56 (L) 65 - 99 mg/dL  Glucose, capillary     Status: None   Collection Time: 08/28/14  9:03 AM  Result Value Ref Range   Glucose-Capillary 66 65 - 99 mg/dL  Glucose, capillary     Status: None   Collection Time: 08/28/14  9:51 AM  Result Value Ref Range   Glucose-Capillary 88 65 - 99 mg/dL    Imaging / Studies: No results found.  Medications / Allergies:  Scheduled Meds: . acetaminophen  1,000 mg Oral TID  .  fluconazole  400 mg Oral Daily  . lip balm  1 application Topical BID  . piperacillin-tazobactam (ZOSYN)  IV  3.375 g Intravenous 3 times per day  . psyllium  1 packet Oral BID  . saccharomyces boulardii  250 mg Oral BID  . sodium chloride  3 mL Intravenous Q12H  . sodium chloride  3 mL Intravenous Q12H   Continuous Infusions:  PRN Meds:.sodium chloride, alum & mag hydroxide-simeth, bismuth subsalicylate, loperamide, magic mouthwash, menthol-cetylpyridinium, morphine injection, ondansetron, ondansetron **OR** ondansetron (ZOFRAN) IV, oxyCODONE, phenol, sodium chloride  Antibiotics: Anti-infectives    Start     Dose/Rate Route Frequency Ordered Stop   08/25/14 2000  vancomycin (VANCOCIN) IVPB 1000 mg/200 mL premix  Status:  Discontinued     1,000 mg 200 mL/hr over 60 Minutes Intravenous Every 12 hours 08/25/14 1009 08/26/14 0949   08/25/14 1200  fluconazole (DIFLUCAN) tablet 400 mg     400 mg Oral Daily 08/25/14 0943     08/23/14 1000  vancomycin (VANCOCIN) IVPB 750 mg/150 ml premix  Status:  Discontinued     750 mg 150 mL/hr over 60 Minutes Intravenous Every 12 hours 08/22/14 2127 08/25/14 1009   08/23/14 0600  piperacillin-tazobactam (ZOSYN) IVPB 3.375 g     3.375 g 12.5 mL/hr over 240 Minutes Intravenous 3 times per day 08/22/14 2124     08/22/14 2130  piperacillin-tazobactam (ZOSYN) IVPB 3.375 g     3.375 g 100 mL/hr over 30 Minutes Intravenous NOW 08/22/14 2124 08/22/14 2220   08/22/14 2130  [MAR Hold]  vancomycin (VANCOCIN) 2,000 mg in sodium chloride 0.9 % 500 mL IVPB     (MAR Hold since 08/22/14 2338)   2,000 mg 250 mL/hr over 120 Minutes Intravenous NOW 08/22/14 2127 08/23/14 0008        Assessment/Plan POD#6 I&D perirectal abscess---Dr. Zella Richer -Sitz baths -Apply dry dressing daily as needed -Mobilize -bulken stool to help with diarrhea and incontinence.  Not sure if flexi-seal is appropriate with active disease/risk of perforation, will leave up to primary team.   -Would strongly recommend consulting GI(seen by Dr. Carlean Purl) for management of colitis.  She has quite a bit of diarrhea and BRB per rectum.  She is incontinent which makes wound management difficult  ID-zosyn D#5/7 Fluconazole D#3/7 Ulcerative colitis-recommend GI consult.  May have steroids from our standpoint if indicated.    Erby Pian, [facility] Pager [phone](7A-4:30P)    08/28/2014 10:35 AM

## 2014-08-28 NOTE — Progress Notes (Addendum)
Patient eating breakfast with 3rd cup of orange juice. Dr. Charlies Silvers aware of patient's low blood sugar and stated to keep giving her orange juice to help blood sugar come up.

## 2014-08-28 NOTE — Discharge Instructions (Addendum)
Normal Saline wet to dry dressing changes to buttock wound twice a day.  She has stool that comes out of this still (she's evacuating her colon) and her wound needs to be washed out with saline with a [place]y syringe twice a day as well.  Routine ileostomy care

## 2014-08-28 NOTE — Consult Note (Signed)
Consultation  Referring Provider:   [facility]   Primary Care Physician:  Kandice Hams, MD Primary Gastroenterologist:   Erskine Emery, MD      Reason for Consultation:  Rectal bleeding            HPI:   [name] is a 74 y.o. female known to [place] for history of left sided ulcerative colitis. She was hospitalized last month with severe diarrhea after taking Cleocin. C-diff was negative. Flexible sigmoidoscopy revealed left sided colitis with pseudomembranes.  Biopsies c/w moderately active chronic colitis, no c-diff seen.She was treated with IV steroids and discharged home on prednisone around 08/21/14. Patient readmitted 7/5/16with abdominal pain and rectal bleeding. CTscan demonstrated a complex perirectal abscess with extensive gas in right glteal soft tissues at the level of the rectum extending to gluteal musculature and throughout the retroperitoneum.   Patient was for discharge home today but that was cancelled given ongoing / increased rectal bleeding. Patient doesn't think she is having diarrhea, mainly just passing blood. Hgb stable at 9.7.   Past Medical History  Diagnosis Date  . CAD (coronary artery disease)     unspecified  . Cerebrovascular disease   . Hyperlipidemia, mixed   . HTN (hypertension)   . Ulcerative colitis   . Diverticulitis   . Colon polyps 2006  . Thyroid disease   . Myocardial infarct   . Carotid artery occlusion 2010  . Diabetes mellitus without complication     Borderline  . GERD (gastroesophageal reflux disease)   . Arthritis     Past Surgical History  Procedure Laterality Date  . Cholecystectomy  7.06.2008    with cholangiogram  . 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 Abdominal hysterectomy    . Colonoscopy  multiple  . Sigmoidoscopy  multiple  . Flexible sigmoidoscopy N/A 08/08/2014    Procedure: FLEXIBLE SIGMOIDOSCOPY;  Surgeon: Gatha Mayer, MD;  Location: [facility];  Service: Endoscopy;  Laterality: N/A;  .  Incision and drainage perirectal abscess N/A 08/22/2014    Procedure: IRRIGATION AND DEBRIDEMENT PERIRECTAL ABSCESS;  Surgeon: Jackolyn Confer, MD;  Location: [facility];  Service: General;  Laterality: N/A;    Family History  Problem Relation Age of Onset  . Colitis Brother     undefined  . Stomach cancer Mother   . Stroke Father     died  . Colon cancer Neg Hx      History  Substance Use Topics  . Smoking status: Former Smoker -- 0.30 packs/day for 40 years    Types: Cigarettes    Quit date: 02/17/2013  . Smokeless tobacco: Never Used  . Alcohol Use: No    Prior to Admission medications   Medication Sig Start Date End Date Taking? Authorizing Provider  CRESTOR 40 MG tablet TAKE 1 TABLET AT BEDTIME Patient taking differently: TAKE 40 MG BY MOUTH AT BEDTIME 05/17/14  Yes Lelon Perla, MD  diphenoxylate-atropine (LOMOTIL) 2.5-0.025 MG per tablet Take 2 tablets by mouth 4 (four) times daily -  before meals and at bedtime. 08/21/14  Yes Domenic Polite, MD  mesalamine (LIALDA) 1.2 G EC tablet take 4 tablets by mouth once daily Patient taking differently: Take 4.8 g by mouth daily.  07/31/14  Yes Inda [place], MD  metoprolol tartrate (LOPRESSOR) 25 MG tablet take 1 tablet by mouth twice a day Patient taking differently: TAKE 25 MG BY MOUTH TWICE DAILY 05/18/14  Yes Lelon Perla, MD  predniSONE (  DELTASONE) 10 MG tablet Take 2 tablets (20 mg total) by mouth 2 (two) times daily. 08/06/14  Yes Milus Banister, MD  saccharomyces boulardii (FLORASTOR) 250 MG capsule Take 1 capsule (250 mg total) by mouth 2 (two) times daily. 08/03/14  Yes Amy S [name], PA-C  acetaminophen (TYLENOL) 500 MG tablet Take 2 tablets (1,000 mg total) by mouth every 4 (four) hours as needed for mild pain or moderate pain. 08/28/14   Robbie Lis, MD  amoxicillin-clavulanate (AUGMENTIN) 875-125 MG per tablet Take 1 tablet by mouth 2 (two) times daily. 08/28/14   Robbie Lis, MD  fluconazole (DIFLUCAN) 200 MG tablet  Take 2 tablets (400 mg total) by mouth daily. 08/28/14   Robbie Lis, MD  hydrochlorothiazide (MICROZIDE) 12.5 MG capsule Take 12.5 mg by mouth daily. 08/16/14   Historical Provider, MD  oxyCODONE (OXY IR/ROXICODONE) 5 MG immediate release tablet Take 1 tablet (5 mg total) by mouth every 4 (four) hours as needed for moderate pain, severe pain or breakthrough pain. 08/28/14   Robbie Lis, MD  psyllium (HYDROCIL/METAMUCIL) 95 % PACK Take 1 packet by mouth 2 (two) times daily. 08/28/14   Robbie Lis, MD    Current Facility-Administered Medications  Medication Dose Route Frequency Provider Last Rate Last Dose  . 0.9 %  sodium chloride infusion  250 mL Intravenous PRN Michael Boston, MD      . acetaminophen (TYLENOL) tablet 1,000 mg  1,000 mg Oral TID Michael Boston, MD   1,000 mg at 08/28/14 0940  . alum & mag hydroxide-simeth (MAALOX/MYLANTA) 200-200-20 MG/5ML suspension 30 mL  30 mL Oral Q6H PRN Michael Boston, MD      . bismuth subsalicylate (PEPTO BISMOL) 262 MG/15ML suspension 30 mL  30 mL Oral Q8H PRN Michael Boston, MD      . fluconazole (DIFLUCAN) tablet 400 mg  400 mg Oral Daily Fanny Skates, MD   400 mg at 08/28/14 0941  . lip balm (CARMEX) ointment 1 application  1 application Topical BID Michael Boston, MD   1 application at 04/54/09 1000  . loperamide (IMODIUM) capsule 2-4 mg  2-4 mg Oral Q8H PRN Michael Boston, MD      . magic mouthwash  15 mL Oral QID PRN Michael Boston, MD      . menthol-cetylpyridinium (CEPACOL) lozenge 3 mg  1 lozenge Oral PRN Michael Boston, MD      . morphine 2 MG/ML injection 2-6 mg  2-6 mg Intravenous Q2H PRN Jackolyn Confer, MD   4 mg at 08/28/14 1458  . ondansetron (ZOFRAN) injection 4 mg  4 mg Intravenous Q4H PRN Jackolyn Confer, MD      . ondansetron [facility]) tablet 4 mg  4 mg Oral Q6H PRN Lavina Hamman, MD       Or  . ondansetron [facility]) injection 4 mg  4 mg Intravenous Q6H PRN Lavina Hamman, MD   4 mg at 08/24/14 1346  . oxyCODONE (Oxy IR/ROXICODONE) immediate  release tablet 5-10 mg  5-10 mg Oral Q4H PRN Michael Boston, MD   10 mg at 08/28/14 0940  . phenol (CHLORASEPTIC) mouth spray 2 spray  2 spray Mouth/Throat PRN Michael Boston, MD      . piperacillin-tazobactam (ZOSYN) IVPB 3.375 g  3.375 g Intravenous 3 times per day Charlesetta Shanks, MD   3.375 g at 08/28/14 1500  . psyllium (HYDROCIL/METAMUCIL) packet 1 packet  1 packet Oral BID Michael Boston, MD   1 packet at 08/27/14 2119  .  saccharomyces boulardii (FLORASTOR) capsule 250 mg  250 mg Oral BID Michael Boston, MD   250 mg at 08/28/14 0940  . sodium chloride 0.9 % injection 3 mL  3 mL Intravenous Q12H Lavina Hamman, MD   3 mL at 08/28/14 0941  . sodium chloride 0.9 % injection 3 mL  3 mL Intravenous Q12H Michael Boston, MD   3 mL at 08/26/14 1000  . sodium chloride 0.9 % injection 3 mL  3 mL Intravenous PRN Michael Boston, MD        Allergies as of 08/22/2014 - Review Complete 08/22/2014  Allergen Reaction Noted  . Clindamycin/lincomycin Diarrhea 08/07/2014  . Ivp dye [iodinated diagnostic agents] Other (See Comments) 01/14/2011     Review of Systems:    As per HPI, otherwise negative    Physical Exam:  Vital signs in last 24 hours: Temp:  [97.8 F (36.6 C)-98.1 F (36.7 C)] 98 F (36.7 C) (07/11 1133) Pulse Rate:  [79-90] 85 (07/11 1133) Resp:  [18] 18 (07/11 1133) BP: (110-138)/(60-87) 138/65 mmHg (07/11 1133) SpO2:  [91 %-100 %] 91 % (07/11 1133) Last BM Date: 08/28/14 General:   Pleasant black in NAD Head:  Normocephalic and atraumatic. Eyes:   No icterus.   Conjunctiva pink. Ears:  Normal auditory acuity. Neck:  Supple Lungs:  Respirations even and unlabored. Lungs clear to auscultation bilaterally.   No wheezes, crackles, or rhonchi.  Heart:  Regular rate and rhythm; no MRG Abdomen:  Soft, nondistended, nontender. Normal bowel sounds. No appreciable masses or hepatomegaly.  Rectal:  Not performed. Dry surgical dressing intact Msk:  Symmetrical without gross deformities.    Extremities:  Without edema. Neurologic:  Alert and  oriented x4;  grossly normal neurologically. Skin:  Intact without significant lesions or rashes. Psych:  Alert and cooperative. Normal affect.  LAB RESULTS:  Recent Labs  08/26/14 0725 08/28/14 0440  WBC 8.4 9.6  HGB 9.6* 9.7*  HCT 29.5* 30.4*  PLT 141* 158   BMET  Recent Labs  08/26/14 0813 08/27/14 0756 08/28/14 0440  NA 131* 132* 131*  K 3.1* 3.2* 3.3*  CL 96* 96* 93*  CO2 26 29 30   GLUCOSE 74 95 77  BUN <5* <5* <5*  CREATININE 0.71 0.64 0.61  CALCIUM 7.2* 7.3* 7.4*      Impression / Plan:   73 year old female with longstanding ulcerative colitis who until recently was maintained on Mesalamine. Recent hospital admission for flare, home on Prednisone. Readmitted a few days later, this time with large ischio perirectal abscess. She is s/p I&D 7/516. She continues to have persistent rectal bleeding but no significant diarrhea. Because of the short interval between last discharge and readmission patient didn't get full course of steroids so bleeding likely from active IBD. May need to restart steroids with caveat that they may affect wound healing.   Clinical course makes one wonder if this may be Crohn's instead of UC. No evidence for right sided or TI disease on last full colonoscopy Sept 2014.   Thanks   LOS: 6 days   Tye Savoy  08/28/2014, 3:09 PM

## 2014-08-28 NOTE — Progress Notes (Signed)
Clinical Social Work  CSW met with patient at bedside to discuss bed offers. If patient continues to need hydrotherapy then [facility] is only option at this time. If hydrotherapy is no longer needed at DC then patient will have other offers. CSW explained this information to patient and will continue to follow.  [place], [place] [phone]

## 2014-08-28 NOTE — Progress Notes (Addendum)
Patient ID: [name], female   DOB: 1940/07/02, 74 y.o.   MRN: [id number] [facility] PROGRESS NOTE  [name]:[id number] DOB: 08/12/1940 DOA: 08/22/2014 PCP: Kandice Hams, MD  Brief narrative:    74 y.o. female with past medical history of dyslipidemia, hypertension, ulcerative colitis and recently hospitalized from 08/07/2014 through 08/21/2014 for ulcerative colitis flare who presented to [facility] long hospital with perianal pain started the night prior to the admission. CT scan on the admission demonstrated a complex perirectal infection extending up toward the retroperitoneal area. On her recent admission she had flexible sigmoidoscopy 08/08/2014 which demonstrated inflammatory changes from the rectum consistent with ulcerative colitis. Patient is status post incision and drainage of the large issue rectal abscess on the right side. She was started on vancomycin and Zosyn. Postprocedure she was doing fine but has developed large amount of bleed on the morning of 08/23/2014. She was transferred out of the stepdown unit 08/24/2014.  Barrier to discharge: Ongoing rectal bleed and diarrhea. Surgery is seen the patient in consultation with recommendation for GI consult. Anticipate discharge to skilled nursing facility once bleeding and diarrhea more controlled.  Assessment/Plan:    Principal Problem:   Sepsis secondary to peri-rectal abscess / leukocytosis  - Sepsis criteria met on the admission with tachycardia, tachypnea, hypotension, leukocytosis. Source of infection is perirectal abscess. - S/P  incision and drainage by surgery 08/23/2014 - Patient was started on vancomycin and Zosyn at the time of the admission. Abscess culture demonstrated yeast but no staph aureus. There are other morphologies present but none predominant.  - Vancomycin was stopped 08/26/2014. She is currently on Zosyn and fluconazole. - I spoke with Dr. Megan Salon of infectious disease who recommended total  duration of abx - 14 days. Once she is stable for discharge she can be switched to Augmentin and fluconazole by mouth.  Active Problems: Acute blood loss anemia / rectal bleed - Patient had a lot of rectal bleed after incision and drainage done 08/23/2014. Initially we thought this is postprocedural however she continues to have blood per rectum. - Per surgery recommendation we will consult GI for input on management. - Her hemoglobin remains stable. Continue to monitor daily CBC.    Essential hypertension - Blood pressure controlled without BP meds - Blood pressure is 124/60    Ulcerative colitis with abscess / diarrhea  - Recently admitted and treated for ulcerative colitis flareup. - Has had recent flexible sigmoidoscopy 08/08/2014 with findings consistent with ulcerative colitis. - She is not on steroids at this time - Appreciate the GI consult and recommendations - Obtain stool for C. difficile    Thrombocytopenia - Mild, possibly from rectal bleeed - Platelets normalized.    Hyponatremia - Likely related to acute infection, sepsis - Sodium stable at 131 - 132    Hypokalemia - Likely from acute infection, GI losses - Supplemented     Generalized weakness  - Patient will be discharged to skilled nursing facility once medically stable.   DVT Prophylaxis  - SCD's bilaterally due to risk of bleeding   Code Status: Full.  Family Communication:  plan of care discussed with the patient Disposition Plan: To skilled nursing facility once rectal bleed and diarrhea improves  IV access:  Peripheral IV  Procedures and diagnostic studies:    Ct Abdomen Pelvis W Contrast 08/22/2014  Extensive soft tissue gas within the right gluteal fat extending into the right gluteal musculature as well as extending cranially throughout the retroperitoneum. Findings are  concerning for an aggressive infectious process in the setting of retroperitoneal fasciitis. Potential causative etiology may be  distal colitis with perforation and associated abscess formation. Recommend surgical consultation and broad-spectrum antibiotic therapy.  Wall thickening of the distal transverse and descending colon likely secondary to colitis.    Medical Consultants:  Surgery, Dr. Fanny Skates  Gastroenterology  Other Consultants:  PT evaluation    IAnti-Infectives:   Vancomycin 08/22/2014 --> 08/26/2014 Zosyn 08/22/2014 --> Fluconazole 08/25/2014 -->   Leisa Lenz, MD  [facility] Pager [phone]  Time spent in minutes: 25 minutes  If 7PM-7AM, please contact night-coverage [email or web address] Password [facility] 08/28/2014, 11:24 AM   LOS: 6 days    HPI/Subjective: No acute overnight events. Still ongoing rectal bleed, diarrhea.  Objective: Filed Vitals:   08/27/14 0500 08/27/14 1507 08/27/14 2157 08/28/14 0515  BP: 121/75 129/68 110/87 124/60  Pulse: 75 84 79 90  Temp: 98.4 F (36.9 C) 98.1 F (36.7 C) 98.1 F (36.7 C) 97.8 F (36.6 C)  TempSrc: Oral Oral Oral Oral  Resp: 18 18 18 18   Height:      Weight:      SpO2: 100% 99% 100% 93%    Intake/Output Summary (Last 24 hours) at 08/28/14 1124 Last data filed at 08/27/14 1415  Gross per 24 hour  Intake    360 ml  Output    450 ml  Net    -90 ml    Exam:   General:  Pt is alert, awake, no acute distress  Cardiovascular: Regular rate, rhythm, appreciate S1-S2  Respiratory: Bilateral air entry, no wheezing  Abdomen: Nontender, nondistended abdomen, appreciate bowel sounds  Extremities: Pulses palpable, no edema  Neuro: No focal deficits   Data Reviewed: Basic Metabolic Panel:  Recent Labs Lab 08/24/14 0350 08/25/14 0900 08/26/14 0813 08/27/14 0756 08/28/14 0440  NA 131* 131* 131* 132* 131*  K 3.8 3.7 3.1* 3.2* 3.3*  CL 99* 97* 96* 96* 93*  CO2 26 28 26 29 30   GLUCOSE 112* 85 74 95 77  BUN 11 7 <5* <5* <5*  CREATININE 0.71 0.68 0.71 0.64 0.61  CALCIUM 7.4* 7.4* 7.2* 7.3* 7.4*   Liver Function  Tests:  Recent Labs Lab 08/22/14 1610 08/23/14 0345  AST 18 15  ALT 14 13*  ALKPHOS 57 46  BILITOT 0.8 0.7  PROT 5.5* 4.9*  ALBUMIN 2.3* 2.0*   No results for input(s): LIPASE, AMYLASE in the last 168 hours. No results for input(s): AMMONIA in the last 168 hours. CBC:  Recent Labs Lab 08/22/14 1610 08/23/14 0345 08/23/14 1000 08/24/14 0350 08/25/14 0900 08/26/14 0725 08/28/14 0440  WBC 13.2* 14.7*  --  11.1* 9.1 8.4 9.6  NEUTROABS 11.9*  --   --   --   --   --   --   HGB 11.3* 10.2* 10.1* 9.3* 9.4* 9.6* 9.7*  HCT 34.7* 31.2* 30.5* 29.1* 30.2* 29.5* 30.4*  MCV 93.3 94.3  --  94.5 95.6 94.6 97.1  PLT 236 217  --  171 141* 141* 158   Cardiac Enzymes: No results for input(s): CKTOTAL, CKMB, CKMBINDEX, TROPONINI in the last 168 hours. BNP: Invalid input(s): POCBNP CBG:  Recent Labs Lab 08/23/14 0045 08/28/14 0811 08/28/14 0843 08/28/14 0903 08/28/14 0951  GLUCAP 143* 60* 56* 66 88   Results for orders placed or performed during the hospital encounter of 08/22/14  Culture, blood (routine x 2)     Status: None   Collection Time: 08/22/14  9:30 PM  Result Value Ref Range Status   Specimen Description BLOOD LEFT HAND  Final   Special Requests BOTTLES DRAWN AEROBIC ONLY 5CC  Final   Culture   Final    NO GROWTH 5 DAYS Performed at [facility]    Report Status 08/27/2014 FINAL  Final  Anaerobic culture     Status: None   Collection Time: 08/23/14 12:03 AM  Result Value Ref Range Status   Specimen Description ABSCESS PERIRECTAL  Final   Special Requests PATIENT ON FOLLOWING ZOYSN, VANC  Final   Gram Stain   Final    NO WBC SEEN NO SQUAMOUS EPITHELIAL CELLS SEEN MODERATE GRAM NEGATIVE RODS FEW YEAST RARE GRAM POSITIVE COCCI    Culture   Final    NO ANAEROBES ISOLATED Performed at [facility]    Report Status 08/27/2014 FINAL  Final  Culture, routine-abscess     Status: None   Collection Time: 08/23/14 12:03 AM  Result Value Ref Range  Status   Specimen Description ABSCESS PERIRECTAL  Final   Special Requests PATIENT ON FOLLOWING ZOYSN, VANC  Final   Gram Stain   Final    NO WBC SEEN NO SQUAMOUS EPITHELIAL CELLS SEEN MODERATE GRAM NEGATIVE RODS RARE YEAST RARE GRAM POSITIVE COCCI    Culture   Final    MULTIPLE ORGANISMS PRESENT, NONE PREDOMINANT Note: NO STAPHYLOCOCCUS AUREUS ISOLATED NO GROUP A STREP (S.PYOGENES) ISOLATED Performed at [facility]    Report Status 08/26/2014 FINAL  Final  MRSA PCR Screening     Status: None   Collection Time: 08/23/14  1:32 AM  Result Value Ref Range Status   MRSA by PCR NEGATIVE NEGATIVE Final    Comment:        The GeneXpert MRSA Assay (FDA approved for NASAL specimens only), is one component of a comprehensive MRSA colonization surveillance program. It is not intended to diagnose MRSA infection nor to guide or monitor treatment for MRSA infections.   Culture, blood (routine x 2)     Status: None (Preliminary result)   Collection Time: 08/23/14  4:55 AM  Result Value Ref Range Status   Specimen Description BLOOD LEFT HAND  Final   Special Requests BOTTLES DRAWN AEROBIC ONLY 5ML  Final   Culture   Final    NO GROWTH 4 DAYS Performed at [facility]    Report Status PENDING  Incomplete       Collection Time: 08/23/14  1:32 AM  Result Value Ref Range Status   MRSA by PCR NEGATIVE NEGATIVE Final     . acetaminophen  1,000 mg Oral TID  . fluconazole  400 mg Oral Daily  . lip balm  1 application Topical BID  . piperacillin-tazobactam (ZOSYN)  IV  3.375 g Intravenous 3 times per day  . psyllium  1 packet Oral BID  . saccharomyces boulardii  250 mg Oral BID  . sodium chloride  3 mL Intravenous Q12H  . sodium chloride  3 mL Intravenous Q12H     Continuous Infusions:

## 2014-08-28 NOTE — Progress Notes (Addendum)
Notified Dr. Charlies Silvers through text paged that L foot is cold to touch. BLE edema. +1 pulse to L dorsalis pedis. Doppler US ordered.

## 2014-08-28 NOTE — Progress Notes (Addendum)
ANTIBIOTIC CONSULT NOTE - Follow Up  Pharmacy Consult for Zosyn Indication: Peri-rectal abscess  Allergies  Allergen Reactions  . Clindamycin/Lincomycin Diarrhea  . Ivp Dye [Iodinated Diagnostic Agents] Other (See Comments)    "almost passed out"    Patient Measurements: Height: 5\' 9"  (175.3 cm) Weight: 229 lb 4.5 oz (104 kg) IBW/kg (Calculated) : 66.2  Vital Signs: Temp: 97.8 F (36.6 C) (07/11 0515) Temp Source: Oral (07/11 0515) BP: 124/60 mmHg (07/11 0515) Pulse Rate: 90 (07/11 0515) Intake/Output from previous day: 07/10 0701 - 07/11 0700 In: 360 [P.O.:360] Out: 450 [Urine:450] Intake/Output from this shift:    Labs:  Recent Labs  08/25/14 0900 08/26/14 0725 08/26/14 0813 08/27/14 0756 08/28/14 0440  WBC 9.1 8.4  --   --   --   HGB 9.4* 9.6*  --   --   --   PLT 141* 141*  --   --   --   CREATININE 0.68  --  0.71 0.64 0.61   Estimated Creatinine Clearance: 76.8 mL/min (by C-G formula based on Cr of 0.61).  Recent Labs  08/25/14 0900  Isabela 13     Microbiology: Recent Results (from the past 720 hour(s))  Clostridium Difficile by PCR (not at [facility])     Status: None   Collection Time: 08/03/14  4:15 PM  Result Value Ref Range Status   C difficile by pcr Not Detected Not Detected Final    Comment: This test is for use only with liquid or soft stools; performance characteristics of other clinical specimen types have not been established.   This assay was performed by Cepheid GeneXpert(R) PCR. The performance characteristics of this assay have been determined by [facility]. Performance characteristics refer to the analytical performance of the test.   Clostridium Difficile by PCR (not at [facility])     Status: None   Collection Time: 08/10/14  1:11 PM  Result Value Ref Range Status   C difficile by pcr NEGATIVE NEGATIVE Final  Culture, blood (routine x 2)     Status: None   Collection Time: 08/22/14  9:30 PM  Result Value Ref Range  Status   Specimen Description BLOOD LEFT HAND  Final   Special Requests BOTTLES DRAWN AEROBIC ONLY 5CC  Final   Culture   Final    NO GROWTH 5 DAYS Performed at [facility]    Report Status 08/27/2014 FINAL  Final  Anaerobic culture     Status: None   Collection Time: 08/23/14 12:03 AM  Result Value Ref Range Status   Specimen Description ABSCESS PERIRECTAL  Final   Special Requests PATIENT ON FOLLOWING ZOYSN, VANC  Final   Gram Stain   Final    NO WBC SEEN NO SQUAMOUS EPITHELIAL CELLS SEEN MODERATE GRAM NEGATIVE RODS FEW YEAST RARE GRAM POSITIVE COCCI    Culture   Final    NO ANAEROBES ISOLATED Performed at [facility]    Report Status 08/27/2014 FINAL  Final  Culture, routine-abscess     Status: None   Collection Time: 08/23/14 12:03 AM  Result Value Ref Range Status   Specimen Description ABSCESS PERIRECTAL  Final   Special Requests PATIENT ON FOLLOWING ZOYSN, VANC  Final   Gram Stain   Final    NO WBC SEEN NO SQUAMOUS EPITHELIAL CELLS SEEN MODERATE GRAM NEGATIVE RODS RARE YEAST RARE GRAM POSITIVE COCCI    Culture   Final    MULTIPLE ORGANISMS PRESENT, NONE PREDOMINANT Note: NO STAPHYLOCOCCUS AUREUS  ISOLATED NO GROUP A STREP (S.PYOGENES) ISOLATED Performed at [facility]    Report Status 08/26/2014 FINAL  Final  MRSA PCR Screening     Status: None   Collection Time: 08/23/14  1:32 AM  Result Value Ref Range Status   MRSA by PCR NEGATIVE NEGATIVE Final    Comment:        The GeneXpert MRSA Assay (FDA approved for NASAL specimens only), is one component of a comprehensive MRSA colonization surveillance program. It is not intended to diagnose MRSA infection nor to guide or monitor treatment for MRSA infections.   Culture, blood (routine x 2)     Status: None (Preliminary result)   Collection Time: 08/23/14  4:55 AM  Result Value Ref Range Status   Specimen Description BLOOD LEFT HAND  Final   Special Requests BOTTLES DRAWN  AEROBIC ONLY 5ML  Final   Culture   Final    NO GROWTH 4 DAYS Performed at [facility]    Report Status PENDING  Incomplete    Medical History: Past Medical History  Diagnosis Date  . CAD (coronary artery disease)     unspecified  . Cerebrovascular disease   . Hyperlipidemia, mixed   . HTN (hypertension)   . Ulcerative colitis   . Diverticulitis   . Colon polyps 2006  . Thyroid disease   . Myocardial infarct   . Carotid artery occlusion 2010  . Diabetes mellitus without complication     Borderline  . GERD (gastroesophageal reflux disease)   . Arthritis     Assessment: 44 yoF with hospitalization 6/20-7/4 for ulcerative colitis flare presents one day following discharge with rectal pain and abdominal bleeding . PMH significant for CAD s/p CABG, CEA.  CT abd/pelvis shows extensive soft tissue gas within the right gluteal fat extending into the gluteal musculature and throughout retroperitoneum.  Findings concerning for aggressive infectious process in setting of retroperitoneal fasciitis. Pharmacy consulted to start vancomycin and zosyn.  Patient is now s/p I&D on 7/5 by surgery.  Fluconazole added per surgery on 7/8 due to presence of yeast in abscess culture. Vancomycin d/c'ed 7/9 per abscess culture data.  7/5 >> Vancomycin  >> 7/9 7/5 >> Zosyn >>   7/8 >> Fluconazole (per MD) >>  WBC now improved to  WNL (7/9) Afebrile Renal function stable. CrCl ~ 77 ml/min CG  7/5 blood: NGF 7/6 blood: NGTD 7/6 abscess: multiple organisms present, none predominant. Rare yeast. No Staph aureus or group A strep isolated. No anaerobes isolated. 7/6 MRSA PCR: negative  Goal of Therapy:  Doses adjusted per renal function Eradication of infection  Plan:   Continue Zosyn 3.375g IV q8h (4 hour infusion time)  Monitor renal function, cultures, clinical course, LOT.   Lindell Spar, PharmD, BCPS Pager: [phone] 08/28/2014 7:43 AM

## 2014-08-29 ENCOUNTER — Encounter: Payer: Self-pay | Admitting: *Deleted

## 2014-08-29 ENCOUNTER — Ambulatory Visit (HOSPITAL_COMMUNITY): Payer: Commercial Managed Care - HMO

## 2014-08-29 DIAGNOSIS — I82409 Acute embolism and thrombosis of unspecified deep veins of unspecified lower extremity: Secondary | ICD-10-CM | POA: Diagnosis present

## 2014-08-29 DIAGNOSIS — J00 Acute nasopharyngitis [common cold]: Secondary | ICD-10-CM

## 2014-08-29 DIAGNOSIS — I82402 Acute embolism and thrombosis of unspecified deep veins of left lower extremity: Secondary | ICD-10-CM

## 2014-08-29 HISTORY — DX: Acute embolism and thrombosis of unspecified deep veins of unspecified lower extremity: I82.409

## 2014-08-29 LAB — CBC
HEMATOCRIT: 31.5 % — AB (ref 36.0–46.0)
Hemoglobin: 10.2 g/dL — ABNORMAL LOW (ref 12.0–15.0)
MCH: 30.9 pg (ref 26.0–34.0)
MCHC: 32.4 g/dL (ref 30.0–36.0)
MCV: 95.5 fL (ref 78.0–100.0)
Platelets: 231 10*3/uL (ref 150–400)
RBC: 3.3 MIL/uL — ABNORMAL LOW (ref 3.87–5.11)
RDW: 14.7 % (ref 11.5–15.5)
WBC: 13.8 10*3/uL — ABNORMAL HIGH (ref 4.0–10.5)

## 2014-08-29 LAB — BASIC METABOLIC PANEL
Anion gap: 11 (ref 5–15)
BUN: 6 mg/dL (ref 6–20)
CO2: 29 mmol/L (ref 22–32)
Calcium: 7.9 mg/dL — ABNORMAL LOW (ref 8.9–10.3)
Chloride: 92 mmol/L — ABNORMAL LOW (ref 101–111)
Creatinine, Ser: 0.64 mg/dL (ref 0.44–1.00)
GFR calc Af Amer: >60 mL/min (ref >60)
GLUCOSE: 87 mg/dL (ref 65–99)
Potassium: 3.8 mmol/L (ref 3.5–5.1)
SODIUM: 132 mmol/L — AB (ref 135–145)

## 2014-08-29 MED ORDER — HYDRALAZINE HCL 10 MG PO TABS
10.0000 mg | ORAL_TABLET | Freq: Three times a day (TID) | ORAL | Status: DC
  Administered 2014-08-29 – 2014-09-04 (×17): 10 mg via ORAL
  Filled 2014-08-29 (×21): qty 1

## 2014-08-29 NOTE — Progress Notes (Signed)
PT Cancellation Note  Patient Details Name: [name] MRN: [id number] DOB: 03/25/1940   Cancelled Treatment:    Reason Eval/Treat Not Completed: Medical issues which prohibited therapy (HAVING BLEEDING FROM WOUND, TOO PAINFULL TO  EVEN ROLL.)   [name] 08/29/2014, 6:20 PM Tresa Endo PT [phone]

## 2014-08-29 NOTE — Progress Notes (Signed)
Patient ID: [name], female   DOB: 04-13-1940, 73 y.o.   MRN: [id number] Request received for IVC filter placement in pt. History noted, labs checked, imaging reviewed by Dr. Anselm Pancoast. Lower extremity venous Doppler study revealed possible left peroneal/proximal calf DVT-final report pending. Case discussed with Dr. Charlies Silvers. Pending final lower extremity venous Doppler results will plan to perform IVC filter placement on 7/13.

## 2014-08-29 NOTE — Progress Notes (Signed)
*  PRELIMINARY RESULTS* Vascular Ultrasound Left lower extremity venous duplex has been completed.  Preliminary findings: Appears to be DVT in the left peroneal veins and in a proximal calf vein, probably gastroc vein.    Landry Mellow, RDMS, RVT  08/29/2014, 11:57 AM

## 2014-08-29 NOTE — Consult Note (Signed)
   [facility] CM Inpatient Consult   08/29/2014  DEJANAY WAMBOLDT [birth year] [id number]   [facility] referral received from [facility]. Went to bedside to speak with patient to discuss [facility] follow up post hospital discharge. Discussed the plan is for SNF. Patient agreeable and consents obtained. [facility] Care Management packet left. Patient requests her daughter be contacted as well. Her daughter cares for patient's husband at home. Will contact patient's daughter, Phoebie Shad at later time. Confirmed Ramona's cell number as [phone]. Will make inpatient RNCM aware.   Marthenia Rolling, MSN-Ed, RN,BSN [facility] Liaison [phone]

## 2014-08-29 NOTE — Progress Notes (Addendum)
08/29/14 1430 HYDROTHERAPY. [id number]  Subjective Assessment  Subjective "honey, you do whatever it takes to get me better".  Patient and Family Stated Goals I [place] this year.   Date of Onset 08/27/14  Prior Treatments Pt in with flare up of ulverative colitis with paina n diarrhea,a nd pain in perirectal area. I &D on 08/23/2014 for drainage adn opening of perirectal abscess. Penrose drains were in place earlier this week, but have been removed by surgery and hydrotherapy consult put in place.   Evaluation and Treatment  Evaluation and Treatment Procedures Explained to Patient/Family Yes  Evaluation and Treatment Procedures agreed to  [REMOVED] Wound / Incision (Open or Dehisced) 08/27/14 Other (Comment) Rectum Right Right Perirectal wound (HYDRO)  Final Assessment Date/Final Assessment Time: 08/27/14 1200  Date First Assessed/Time First Assessed: 08/27/14 1100   Wound Type: Other (Comment)  Location: Rectum  Location Orientation: Right  Wound Description (Comments): Right Perirectal wound (HYDR...  Dressing Type ABD  Dressing Status Dry  Dressing Change Frequency PRN  Site / Wound Assessment Red;Brown;Bleeding;Yellow  % Wound base Red or Granulating 20% (difficult to visualize with large clot in wound bed.)  % Wound base Other (Comment) 30% (wound completely filled with blood clot.)  Peri-wound Assessment Induration;Intact  Margins Unattached edges (unapproximated)  Closure None  Drainage Amount Copious  Drainage Description Other (Comment) (completely filled with blood clot.)  Non-staged Wound Description Full thickness  Hydrotherapy  Pulsed lavage therapy - wound location right perirectal   Pulsed Lavage with Suction (psi) 8 psi  Pulsed Lavage with Suction - Normal Saline Used (only 200 due to risk for bleeding in wound.)  Pulsed Lavage Tip Tip with splash shield (pulled back tip shield to enter into  wound more)  Selective Debridement  Selective Debridement - Location deep in wound  Selective Debridement - Tools Used Forceps  Selective Debridement - Tissue Removed Emina, NP from [facility] debrided clotted blood from wound.  Wound Therapy - Assess/Plan/Recommendations  Wound Therapy - Clinical Statement Pt wih perirectal abscess that had an I&D on 08/23/2014 and remain opened and  draining. TODAY, PATIENT FOUND IN BED WITH COPIOUS AMOUNT OF BLOODY DRAINAGE, MOSTLY BLOOD, OOZING BETWEEN LEGS IN PERIAREA. UPON ROLLING TO SIDE, NOTED FURTHER LARGE AMOUNTS OF CLOTTED BLOOD. REMOVED DRESSING AND WOUND  HAD CLOTTED  BLOOD WHICH WAS FILLING THE WOUND.EMINA , NP FROM [facility] IN TO INSPECT WOUND. REMOVED A SMALL AMOUNT OF BLOOD BUT  BASE REMAINED WITH CLOT. SHE AGREED  THAT PT SHOULD ONLY GENTLY  [place] WOUND WITH SMALL AMOUNT OF NS WITH PLS AND PACK WITH NS KERLIX.AND PLACE ABD. THIS WAS CARRIED OUT. THE WOUND REMAINED WITH BLOOD CLOT IN BASE. DUE TO APPARENT ACTIVE BLEEDING, PLS BY PT IS NOT INDICATED. PLS WILL BE HELD UNTIL [facility] CLARIFIES WHEN/IF INDICATED TO CONTINUE PLS.Marland Kitchen   Wound Therapy - Functional Problem List deconditioning and unhealing wound  Factors Delaying/Impairing Wound Healing Incontinence;Multiple medical problems  Hydrotherapy Plan Debridement;Dressing change;Patient/family education;Pulsatile lavage with suction  Wound Therapy - Frequency 6X / week (no hydrotherapy on Sunday, nursing to continue with dressing)  Wound Therapy - Current Recommendations PT;Surgery consult;OT  Wound Therapy - Follow Up Recommendations Skilled nursing facility  Wound Plan will continue with PLS while pt here and needed.   Wound Therapy Goals - Improve the function of patient's integumentary system by progressing the wound(s) through the phases of wound healing by:  Decrease Necrotic Tissue to 0  Decrease Necrotic Tissue - Progress Not progressing  Increase Granulation Tissue to 100  Improve Drainage Characteristics Min   Improve Drainage Characteristics - Progress Not progressing-new onset bleeding in wound.  Patient/Family will be able to  recongize symptoms aof progress or not and help assist with dressing changes as needed.  Goals/treatment plan/discharge plan were made with and agreed upon by patient/family Yes  Time For Goal Achievement 2 weeks  Wound Therapy - Potential for Goals Alfredo Martinez PT [phone]

## 2014-08-29 NOTE — Progress Notes (Addendum)
Patient ID: [name], female   DOB: Oct 16, 1940, 74 y.o.   MRN: [id number] [facility] PROGRESS NOTE  [name] BZJ:[id number] DOB: 1940/06/30 DOA: 08/22/2014 PCP: Kandice Hams, MD  Brief narrative:    74 y.o. female with past medical history of dyslipidemia, hypertension, ulcerative colitis and recently hospitalized from 08/07/2014 through 08/21/2014 for ulcerative colitis flare who presented to [facility] long hospital with perianal pain started the night prior to the admission. CT scan on the admission demonstrated a complex perirectal infection extending up toward the retroperitoneal area. On her recent admission she had flexible sigmoidoscopy 08/08/2014 which demonstrated inflammatory changes from the rectum consistent with ulcerative colitis.  Patient is status post incision and drainage of the large issue rectal abscess on the right side. She was started on vancomycin and Zosyn. Postprocedure she was doing fine but has developed large amount of bleed on the morning of 08/23/2014. She was transferred out of the stepdown unit 08/24/2014. Hospital course is complicated with ongoing rectal bleed so GI was consulted and pt was started on prednisone and mesalamine. Additionally, she was found to have left LE DVT On venous doppler done 7/12.   Barrier to discharge: Plan to place IVC filter per IR hopefully in next 1-2 days. She is not a candidate for anticoagulation at this time due to active rectal bleed.   Assessment/Plan:    Principal Problem:   Sepsis secondary to peri-rectal abscess / leukocytosis  - Sepsis criteria met on the admission with tachycardia, tachypnea, hypotension, leukocytosis. Source of infection is perirectal abscess. - S/P  incision and drainage by surgery 08/23/2014 - Patient was started on vancomycin and Zosyn at the time of the admission. Abscess culture demonstrated yeast but no staph aureus. There are other morphologies present but none predominant. Blood  cultures to date are negative.  - Vancomycin was stopped 08/26/2014. Currently on Zosyn and fluconazole. - I spoke with Dr. Megan Salon of infectious disease who recommended total duration of antibiotics - 14 days. Once she is stable for discharge she can be switched to Augmentin and fluconazole by mouth.  Active Problems: Acute blood loss anemia / active rectal bleed - Patient had a lot of rectal bleed after incision and drainage done 08/23/2014. Initially we thought this is postprocedural however she continues to have blood per rectum.  -GI consulted and pt started on prednisone and mesalamine - Check CBC today     Essential hypertension - SBP in 150's range - Add hydralazine for better BP control     Left lower extremity DVT - Noted on LLE doppler - Not a candidate for anticoagulation due to ongoing active rectal bleed - Order placed for IR to place IVC filter; appreciate their help     Ulcerative colitis with abscess / diarrhea  - Recently admitted and treated for ulcerative colitis flareup. - Has had recent flexible sigmoidoscopy 08/08/2014 with findings consistent with ulcerative colitis. - Appreciate the GI recommendations; Pt started on prednisone and mesalamine  - Stool for C.diff negative     Thrombocytopenia - Mild, possibly from rectal bleeed - Platelets normalized.    Hyponatremia - Likely related to acute infection, sepsis - Sodium stable at 131 - 132 - Check BMP today     Hypokalemia - Likely from acute infection, GI losses - Supplemented  - Check BMP today     Generalized weakness  - Patient will be discharged to skilled nursing facility once medically stable.   DVT Prophylaxis  - SCD's bilaterally due to  risk of bleeding   Code Status: Full.  Family Communication:  plan of care discussed with the patient Disposition Plan: to SNF once diarrhea and bleeding improves. Now also needs IVC filter placement   IV access:  Peripheral IV  Procedures and  diagnostic studies:    Ct Abdomen Pelvis W Contrast 08/22/2014  Extensive soft tissue gas within the right gluteal fat extending into the right gluteal musculature as well as extending cranially throughout the retroperitoneum. Findings are concerning for an aggressive infectious process in the setting of retroperitoneal fasciitis. Potential causative etiology may be distal colitis with perforation and associated abscess formation. Recommend surgical consultation and broad-spectrum antibiotic therapy.  Wall thickening of the distal transverse and descending colon likely secondary to colitis.    Lower extremity venous doppler 08/29/2014 - Prelim report - appears to be DVT in the left peroneal veins and in a proximal calf vein, probably gastroc vein.  Medical Consultants:  Surgery, Dr. Fanny Skates  Gastroenterology, Dr. Scarlette Shorts Interventional radiology for IVC filter placement  Other Consultants:  PT evaluation    IAnti-Infectives:   Vancomycin 08/22/2014 --> 08/26/2014 Zosyn 08/22/2014 --> Fluconazole 08/25/2014 -->   Leisa Lenz, MD  [facility] Pager [phone]  Time spent in minutes: 25 minutes  If 7PM-7AM, please contact night-coverage [email or web address] Password [facility] 08/29/2014, 4:23 PM   LOS: 7 days    HPI/Subjective: No acute overnight events. Still ongoing rectal bleed, diarrhea.   Objective: Filed Vitals:   08/28/14 1605 08/28/14 2047 08/29/14 0616 08/29/14 1333  BP: 115/57 138/65 159/79 150/68  Pulse: 75 93 71 79  Temp: 98.2 F (36.8 C) 98.2 F (36.8 C) 98.3 F (36.8 C) 98 F (36.7 C)  TempSrc: Oral Oral Oral Oral  Resp: 20 18 18 18   Height:      Weight:      SpO2: 94% 95% 97% 98%    Intake/Output Summary (Last 24 hours) at 08/29/14 1623 Last data filed at 08/29/14 1300  Gross per 24 hour  Intake    100 ml  Output    500 ml  Net   -400 ml    Exam:   General:  Pt is alert, no acute distress   Cardiovascular: Rate controlled, (+) S1,  S2  Respiratory: No wheezing, no rhonchi   Abdomen: appreciate bowel sounds bilaterally, non tender abdomen   Extremities: Pulses palpable, left leg swelling more than right  Neuro: Non focal  Data Reviewed: Basic Metabolic Panel:  Recent Labs Lab 08/24/14 0350 08/25/14 0900 08/26/14 0813 08/27/14 0756 08/28/14 0440  NA 131* 131* 131* 132* 131*  K 3.8 3.7 3.1* 3.2* 3.3*  CL 99* 97* 96* 96* 93*  CO2 26 28 26 29 30   GLUCOSE 112* 85 74 95 77  BUN 11 7 <5* <5* <5*  CREATININE 0.71 0.68 0.71 0.64 0.61  CALCIUM 7.4* 7.4* 7.2* 7.3* 7.4*   Liver Function Tests:  Recent Labs Lab 08/23/14 0345  AST 15  ALT 13*  ALKPHOS 46  BILITOT 0.7  PROT 4.9*  ALBUMIN 2.0*   No results for input(s): LIPASE, AMYLASE in the last 168 hours. No results for input(s): AMMONIA in the last 168 hours. CBC:  Recent Labs Lab 08/23/14 0345 08/23/14 1000 08/24/14 0350 08/25/14 0900 08/26/14 0725 08/28/14 0440  WBC 14.7*  --  11.1* 9.1 8.4 9.6  HGB 10.2* 10.1* 9.3* 9.4* 9.6* 9.7*  HCT 31.2* 30.5* 29.1* 30.2* 29.5* 30.4*  MCV 94.3  --  94.5 95.6 94.6 97.1  PLT  217  --  171 141* 141* 158   Cardiac Enzymes: No results for input(s): CKTOTAL, CKMB, CKMBINDEX, TROPONINI in the last 168 hours. BNP: Invalid input(s): POCBNP CBG:  Recent Labs Lab 08/23/14 0045 08/28/14 0811 08/28/14 0843 08/28/14 0903 08/28/14 0951  GLUCAP 143* 60* 56* 66 88   Results for orders placed or performed during the hospital encounter of 08/22/14  Culture, blood (routine x 2)     Status: None   Collection Time: 08/22/14  9:30 PM  Result Value Ref Range Status   Specimen Description BLOOD LEFT HAND  Final   Special Requests BOTTLES DRAWN AEROBIC ONLY 5CC  Final   Culture   Final    NO GROWTH 5 DAYS Performed at [facility]    Report Status 08/27/2014 FINAL  Final  Anaerobic culture     Status: None   Collection Time: 08/23/14 12:03 AM  Result Value Ref Range Status   Specimen Description  ABSCESS PERIRECTAL  Final   Special Requests PATIENT ON FOLLOWING ZOYSN, VANC  Final   Gram Stain   Final    NO WBC SEEN NO SQUAMOUS EPITHELIAL CELLS SEEN MODERATE GRAM NEGATIVE RODS FEW YEAST RARE GRAM POSITIVE COCCI    Culture   Final    NO ANAEROBES ISOLATED Performed at [facility]    Report Status 08/27/2014 FINAL  Final  Culture, routine-abscess     Status: None   Collection Time: 08/23/14 12:03 AM  Result Value Ref Range Status   Specimen Description ABSCESS PERIRECTAL  Final   Special Requests PATIENT ON FOLLOWING ZOYSN, VANC  Final   Gram Stain   Final    NO WBC SEEN NO SQUAMOUS EPITHELIAL CELLS SEEN MODERATE GRAM NEGATIVE RODS RARE YEAST RARE GRAM POSITIVE COCCI    Culture   Final    MULTIPLE ORGANISMS PRESENT, NONE PREDOMINANT Note: NO STAPHYLOCOCCUS AUREUS ISOLATED NO GROUP A STREP (S.PYOGENES) ISOLATED Performed at [facility]    Report Status 08/26/2014 FINAL  Final  MRSA PCR Screening     Status: None   Collection Time: 08/23/14  1:32 AM  Result Value Ref Range Status   MRSA by PCR NEGATIVE NEGATIVE Final  Culture, blood (routine x 2)     Status: None   Collection Time: 08/23/14  4:55 AM  Result Value Ref Range Status   Specimen Description BLOOD LEFT HAND  Final   Special Requests BOTTLES DRAWN AEROBIC ONLY 5ML  Final   Culture   Final    NO GROWTH 5 DAYS Performed at [facility]    Report Status 08/28/2014 FINAL  Final  Clostridium Difficile by PCR (not at [facility])     Status: None   Collection Time: 08/28/14  7:11 PM  Result Value Ref Range Status   C difficile by pcr NEGATIVE NEGATIVE Final       Collection Time: 08/23/14  1:32 AM  Result Value Ref Range Status   MRSA by PCR NEGATIVE NEGATIVE Final     . acetaminophen  1,000 mg Oral TID  . fluconazole  400 mg Oral Daily  . lip balm  1 application Topical BID  . mesalamine  4.8 g Oral Q breakfast  . piperacillin-tazobactam (ZOSYN)  IV  3.375 g Intravenous 3 times  per day  . predniSONE  20 mg Oral QAC breakfast  . psyllium  1 packet Oral BID  . saccharomyces boulardii  250 mg Oral BID  . sodium chloride  3 mL Intravenous Q12H  .  sodium chloride  3 mL Intravenous Q12H     Continuous Infusions:

## 2014-08-29 NOTE — Progress Notes (Signed)
Dear Doctor Charlies Silvers:  This patient has been identified as a candidate for PICC for the following reason (s): IV therapy over 48 hours, frequent restarts due to infiltration, and generalized poor venous access. If you agree, please write an order for the indicated device. For any questions contact the Vascular Access Team at [phone] if no answer, please leave a message.  Thank you for supporting the early vascular access assessment program.

## 2014-08-29 NOTE — Progress Notes (Signed)
    Progress Note   Subjective  Still having lower abdominal pain, passing some blood from rectum.     Objective   Vital signs in last 24 hours: Temp:  [98 F (36.7 C)-98.3 F (36.8 C)] 98.3 F (36.8 C) (07/12 0616) Pulse Rate:  [71-93] 71 (07/12 0616) Resp:  [18-20] 18 (07/12 0616) BP: (115-159)/(57-79) 159/79 mmHg (07/12 0616) SpO2:  [91 %-97 %] 97 % (07/12 0616) Last BM Date: 08/28/14 General:    Pleasant black female in NAD Abdomen:  Soft, nontender and nondistended. Normal bowel sounds. Neurologic:  Alert and oriented,  grossly normal neurologically. Psych:  Cooperative. Normal mood and affect.    Assessment / Plan:    37. 74 year old female with longstanding ulcerative colitis admitted with large ischio perirectal abscess, s/p I&D 7/516. On Zosyn and fluconazole.  She continues to have some rectal bleeding, hard for her to determine just how much. No significant diarrhea.  We cautiously restarted prednisone yesterday, not ideal in setting of recent abscess  but her bleeding is presumed to be from active IBD. Home Mesalamine was restarted as well. Will recheck hgb in am.   2. New DVT - left peroneal vein / calf vien. Obviously anticoagulants will exacerbate her bleeding. Consider IVC filter.   LOS: 7 days   [name]  08/29/2014, 9:21 AM

## 2014-08-29 NOTE — Care Management Note (Signed)
Case Management Note  Patient Details  Name: MAGHAN JESSEE MRN: [id number] Date of Birth: September 24, 1940  Subjective/Objective: Peri rectal abscess, bloody stools. PT-pulse lavage.IV abx.d/c plan SNF.                 Action/Plan:   Expected Discharge Date:                   Expected Discharge Plan:  [place]  In-House Referral:  NA  Discharge planning Services  CM Consult  Post Acute Care Choice:  NA Choice offered to:  NA  DME Arranged:  N/A DME Agency:  NA  HH Arranged:  NA HH Agency:  NA  Status of Service:  In process, will continue to follow  Medicare Important Message Given:  Yes-second notification given Date Medicare IM Given:    Medicare IM give by:    Date Additional Medicare IM Given:    Additional Medicare Important Message give by:     If discussed at [place] of Stay Meetings, dates discussed:  08/29/14  Additional Comments:  Dessa Phi, RN 08/29/2014, 11:47 AM

## 2014-08-29 NOTE — Progress Notes (Signed)
Patient ID: [name], female   DOB: 10/21/1940, 74 y.o.   MRN: [id number]      [address] [place], [place] [address]    Phone: [phone] FAX: [phone]     Subjective: Hydro in room.  Continues to have diarrhea and BRBPR.  New DVT.    Objective:  Vital signs:  Filed Vitals:   08/28/14 1605 08/28/14 2047 08/29/14 0616 08/29/14 1333  BP: 115/57 138/65 159/79 150/68  Pulse: 75 93 71 79  Temp: 98.2 F (36.8 C) 98.2 F (36.8 C) 98.3 F (36.8 C) 98 F (36.7 C)  TempSrc: Oral Oral Oral Oral  Resp: $Remo'20 18 18 18  'etjmj$ Height:      Weight:      SpO2: 94% 95% 97% 98%    Last BM Date: 08/28/14  Intake/Output   Yesterday:  07/11 0701 - 07/12 0700 In: 100 [IV Piggyback:100] Out: -  This shift:    I/O last 3 completed shifts: In: 100 [IV Piggyback:100] Out: -     Physical Exam:  Skin: wound with fibrinous exudate, bleeding also noted and a large clot was removed.     Problem List:   Principal Problem:   Abscess, perirectal s/p I&D 08/23/2014 Active Problems:   Essential hypertension   Inflammatory bowel disease (IBD) with colitis   Sepsis   Infection due to yeast   Acute blood loss anemia   Leukocytosis   Hypokalemia   Hyponatremia   General weakness   Thrombocytopenia   Fecal incontinence   Ulcerative colitis    Results:   Labs: Results for orders placed or performed during the hospital encounter of 08/22/14 (from the past 48 hour(s))  Basic metabolic panel     Status: Abnormal   Collection Time: 08/28/14  4:40 AM  Result Value Ref Range   Sodium 131 (L) 135 - 145 mmol/L   Potassium 3.3 (L) 3.5 - 5.1 mmol/L   Chloride 93 (L) 101 - 111 mmol/L   CO2 30 22 - 32 mmol/L   Glucose, Bld 77 65 - 99 mg/dL   BUN <5 (L) 6 - 20 mg/dL   Creatinine, Ser 0.61 0.44 - 1.00 mg/dL   Calcium 7.4 (L) 8.9 - 10.3 mg/dL   GFR calc non Af Amer >60 >60 mL/min   GFR calc Af Amer >60 >60 mL/min     Comment: (NOTE) The eGFR has been calculated using the CKD EPI equation. This calculation has not been validated in all clinical situations. eGFR's persistently <60 mL/min signify possible Chronic Kidney Disease.    Anion gap 8 5 - 15  CBC     Status: Abnormal   Collection Time: 08/28/14  4:40 AM  Result Value Ref Range   WBC 9.6 4.0 - 10.5 K/uL   RBC 3.13 (L) 3.87 - 5.11 MIL/uL   Hemoglobin 9.7 (L) 12.0 - 15.0 g/dL   HCT 30.4 (L) 36.0 - 46.0 %   MCV 97.1 78.0 - 100.0 fL   MCH 31.0 26.0 - 34.0 pg   MCHC 31.9 30.0 - 36.0 g/dL   RDW 14.5 11.5 - 15.5 %   Platelets 158 150 - 400 K/uL  Glucose, capillary     Status: Abnormal   Collection Time: 08/28/14  8:11 AM  Result Value Ref Range   Glucose-Capillary 60 (L) 65 - 99 mg/dL  Glucose, capillary     Status: Abnormal   Collection Time: 08/28/14  8:43 AM  Result Value Ref Range   Glucose-Capillary 56 (L) 65 - 99 mg/dL  Glucose, capillary     Status: None   Collection Time: 08/28/14  9:03 AM  Result Value Ref Range   Glucose-Capillary 66 65 - 99 mg/dL  Glucose, capillary     Status: None   Collection Time: 08/28/14  9:51 AM  Result Value Ref Range   Glucose-Capillary 88 65 - 99 mg/dL  Clostridium Difficile by PCR (not at [facility])     Status: None   Collection Time: 08/28/14  7:11 PM  Result Value Ref Range   C difficile by pcr NEGATIVE NEGATIVE    Imaging / Studies: No results found.  Medications / Allergies:  Scheduled Meds: . acetaminophen  1,000 mg Oral TID  . fluconazole  400 mg Oral Daily  . lip balm  1 application Topical BID  . mesalamine  4.8 g Oral Q breakfast  . piperacillin-tazobactam (ZOSYN)  IV  3.375 g Intravenous 3 times per day  . predniSONE  20 mg Oral QAC breakfast  . psyllium  1 packet Oral BID  . saccharomyces boulardii  250 mg Oral BID  . sodium chloride  3 mL Intravenous Q12H  . sodium chloride  3 mL Intravenous Q12H   Continuous Infusions:  PRN Meds:.sodium chloride, alum & mag hydroxide-simeth,  bismuth subsalicylate, loperamide, magic mouthwash, menthol-cetylpyridinium, morphine injection, ondansetron, ondansetron **OR** ondansetron (ZOFRAN) IV, oxyCODONE, phenol, sodium chloride  Antibiotics: Anti-infectives    Start     Dose/Rate Route Frequency Ordered Stop   08/28/14 0000  fluconazole (DIFLUCAN) 200 MG tablet     400 mg Oral Daily 08/28/14 1052     08/28/14 0000  amoxicillin-clavulanate (AUGMENTIN) 875-125 MG per tablet     1 tablet Oral 2 times daily 08/28/14 1052     08/25/14 2000  vancomycin (VANCOCIN) IVPB 1000 mg/200 mL premix  Status:  Discontinued     1,000 mg 200 mL/hr over 60 Minutes Intravenous Every 12 hours 08/25/14 1009 08/26/14 0949   08/25/14 1200  fluconazole (DIFLUCAN) tablet 400 mg     400 mg Oral Daily 08/25/14 0943     08/23/14 1000  vancomycin (VANCOCIN) IVPB 750 mg/150 ml premix  Status:  Discontinued     750 mg 150 mL/hr over 60 Minutes Intravenous Every 12 hours 08/22/14 2127 08/25/14 1009   08/23/14 0600  piperacillin-tazobactam (ZOSYN) IVPB 3.375 g     3.375 g 12.5 mL/hr over 240 Minutes Intravenous 3 times per day 08/22/14 2124     08/22/14 2130  piperacillin-tazobactam (ZOSYN) IVPB 3.375 g     3.375 g 100 mL/hr over 30 Minutes Intravenous NOW 08/22/14 2124 08/22/14 2220   08/22/14 2130  [MAR Hold]  vancomycin (VANCOCIN) 2,000 mg in sodium chloride 0.9 % 500 mL IVPB     (MAR Hold since 08/22/14 2338)   2,000 mg 250 mL/hr over 120 Minutes Intravenous NOW 08/22/14 2127 08/23/14 0008        Assessment/Plan Ulcerative colitis POD#7 I&D perirectal abscess---Dr. Zella Richer i think we need to start packing as the cavity is fairly large and had some bleeding.  Will start BID wet to dry dressing changes and PRN when soiled.  Continue with hydrotherapy. Mobilize Bulk up stool. Appreciate GI eval, now on steroids and mesalamine  Watch for bleeding with steroids and likely anticoagulation initiation with new DVT. ID-per ID and primary  team.   Erby Pian, [facility] Pager [phone](7A-4:30P)   08/29/2014  2:10  PM   

## 2014-08-30 ENCOUNTER — Inpatient Hospital Stay (HOSPITAL_COMMUNITY): Payer: Commercial Managed Care - HMO

## 2014-08-30 DIAGNOSIS — R531 Weakness: Secondary | ICD-10-CM

## 2014-08-30 LAB — BASIC METABOLIC PANEL
ANION GAP: 11 (ref 5–15)
BUN: 6 mg/dL (ref 6–20)
CALCIUM: 7.6 mg/dL — AB (ref 8.9–10.3)
CHLORIDE: 92 mmol/L — AB (ref 101–111)
CO2: 30 mmol/L (ref 22–32)
Creatinine, Ser: 0.6 mg/dL (ref 0.44–1.00)
GFR calc non Af Amer: >60 mL/min (ref >60)
GLUCOSE: 84 mg/dL (ref 65–99)
POTASSIUM: 3.6 mmol/L (ref 3.5–5.1)
Sodium: 133 mmol/L — ABNORMAL LOW (ref 135–145)

## 2014-08-30 LAB — PROTIME-INR
INR: 1.01 (ref 0.00–1.49)
PROTHROMBIN TIME: 13.5 s (ref 11.6–15.2)

## 2014-08-30 LAB — CBC
HCT: 29.5 % — ABNORMAL LOW (ref 36.0–46.0)
HEMOGLOBIN: 9.5 g/dL — AB (ref 12.0–15.0)
MCH: 30.7 pg (ref 26.0–34.0)
MCHC: 32.2 g/dL (ref 30.0–36.0)
MCV: 95.5 fL (ref 78.0–100.0)
PLATELETS: 219 10*3/uL (ref 150–400)
RBC: 3.09 MIL/uL — ABNORMAL LOW (ref 3.87–5.11)
RDW: 14.9 % (ref 11.5–15.5)
WBC: 11.6 10*3/uL — AB (ref 4.0–10.5)

## 2014-08-30 MED ORDER — MIDAZOLAM HCL 2 MG/2ML IJ SOLN
INTRAMUSCULAR | Status: AC | PRN
  Administered 2014-08-30 (×2): 0.5 mg via INTRAVENOUS
  Administered 2014-08-30: 1 mg via INTRAVENOUS

## 2014-08-30 MED ORDER — LIDOCAINE-EPINEPHRINE 2 %-1:100000 IJ SOLN
INTRAMUSCULAR | Status: AC
  Filled 2014-08-30: qty 1

## 2014-08-30 MED ORDER — MIDAZOLAM HCL 2 MG/2ML IJ SOLN
INTRAMUSCULAR | Status: AC
  Filled 2014-08-30: qty 6

## 2014-08-30 MED ORDER — FENTANYL CITRATE (PF) 100 MCG/2ML IJ SOLN
INTRAMUSCULAR | Status: AC | PRN
  Administered 2014-08-30 (×2): 25 ug via INTRAVENOUS

## 2014-08-30 MED ORDER — FENTANYL CITRATE (PF) 100 MCG/2ML IJ SOLN
INTRAMUSCULAR | Status: AC
  Filled 2014-08-30: qty 4

## 2014-08-30 NOTE — Progress Notes (Signed)
PT Cancellation Note  Patient Details Name: [name] MRN: [id number] DOB: Jul 08, 1940   Cancelled Treatment:    Reason Eval/Treat Not Completed: Patient at procedure or test/unavailable Pt to have IVC filter today.  Will check back tomorrow for possible hydrotherapy.  Per previous PT, large blood clot in wound as well as active bleeding yesterday which is a precaution for pulsatile lavage.   Sahira Cataldi,[name] 08/30/2014, 1:01 PM [name], PT, DPT 08/30/2014 Pager: [phone]

## 2014-08-30 NOTE — Progress Notes (Signed)
Patient ID: [name], female   DOB: April 05, 1940, 74 y.o.   MRN: [id number]     [facility]      [address] [place], [place] [address]    Phone: [phone] FAX: [phone]     Subjective: Not getting OOB.   Objective:  Vital signs:  Filed Vitals:   08/29/14 0616 08/29/14 1333 08/29/14 2206 08/30/14 0613  BP: 159/79 150/68 150/65 139/77  Pulse: 71 79 90 93  Temp: 98.3 F (36.8 C) 98 F (36.7 C) 98.2 F (36.8 C) 98.1 F (36.7 C)  TempSrc: Oral Oral Oral Oral  Resp: 18 18 18 18   Height:      Weight:      SpO2: 97% 98% 94% 93%    Last BM Date: 08/29/14  Intake/Output   Yesterday:  07/12 0701 - 07/13 0700 In: -  Out: 500 [Urine:500] This shift:    I/O last 3 completed shifts: In: 100 [IV Piggyback:100] Out: 500 [Urine:500]    Physical Exam: General: Pt awake/alert/oriented x4 in no acute distress Skin: dressing in place.   Problem List:   Principal Problem:   Abscess, perirectal s/p I&D 08/23/2014 Active Problems:   Essential hypertension   Inflammatory bowel disease (IBD) with colitis   Sepsis   Infection due to yeast   Acute blood loss anemia   Leukocytosis   Hypokalemia   Hyponatremia   General weakness   Thrombocytopenia   Ulcerative colitis   DVT (deep venous thrombosis)    Results:   Labs: Results for orders placed or performed during the hospital encounter of 08/22/14 (from the past 48 hour(s))  Glucose, capillary     Status: None   Collection Time: 08/28/14  9:51 AM  Result Value Ref Range   Glucose-Capillary 88 65 - 99 mg/dL  Clostridium Difficile by PCR (not at [facility])     Status: None   Collection Time: 08/28/14  7:11 PM  Result Value Ref Range   C difficile by pcr NEGATIVE NEGATIVE  CBC     Status: Abnormal   Collection Time: 08/29/14  6:37 PM  Result Value Ref Range   WBC 13.8 (H) 4.0 - 10.5 K/uL   RBC 3.30 (L) 3.87 - 5.11 MIL/uL   Hemoglobin 10.2 (L) 12.0 - 15.0 g/dL   HCT 31.5 (L) 36.0 - 46.0 %   MCV 95.5 78.0 - 100.0 fL   MCH 30.9 26.0 - 34.0 pg   MCHC 32.4 30.0 - 36.0 g/dL   RDW 14.7 11.5 - 15.5 %   Platelets 231 150 - 400 K/uL  Basic metabolic panel     Status: Abnormal   Collection Time: 08/29/14  6:37 PM  Result Value Ref Range   Sodium 132 (L) 135 - 145 mmol/L   Potassium 3.8 3.5 - 5.1 mmol/L   Chloride 92 (L) 101 - 111 mmol/L   CO2 29 22 - 32 mmol/L   Glucose, Bld 87 65 - 99 mg/dL   BUN 6 6 - 20 mg/dL   Creatinine, Ser 0.64 0.44 - 1.00 mg/dL   Calcium 7.9 (L) 8.9 - 10.3 mg/dL   GFR calc non Af Amer >60 >60 mL/min   GFR calc Af Amer >60 >60 mL/min    Comment: (NOTE) The eGFR has been calculated using the CKD EPI equation. This calculation has not been validated in all clinical situations. eGFR's persistently <60 mL/min signify possible Chronic Kidney Disease.    Anion gap 11 5 -  15  CBC     Status: Abnormal   Collection Time: 08/30/14  5:15 AM  Result Value Ref Range   WBC 11.6 (H) 4.0 - 10.5 K/uL   RBC 3.09 (L) 3.87 - 5.11 MIL/uL   Hemoglobin 9.5 (L) 12.0 - 15.0 g/dL   HCT 29.5 (L) 36.0 - 46.0 %   MCV 95.5 78.0 - 100.0 fL   MCH 30.7 26.0 - 34.0 pg   MCHC 32.2 30.0 - 36.0 g/dL   RDW 14.9 11.5 - 15.5 %   Platelets 219 150 - 400 K/uL  Basic metabolic panel     Status: Abnormal   Collection Time: 08/30/14  5:15 AM  Result Value Ref Range   Sodium 133 (L) 135 - 145 mmol/L   Potassium 3.6 3.5 - 5.1 mmol/L   Chloride 92 (L) 101 - 111 mmol/L   CO2 30 22 - 32 mmol/L   Glucose, Bld 84 65 - 99 mg/dL   BUN 6 6 - 20 mg/dL   Creatinine, Ser 0.60 0.44 - 1.00 mg/dL   Calcium 7.6 (L) 8.9 - 10.3 mg/dL   GFR calc non Af Amer >60 >60 mL/min   GFR calc Af Amer >60 >60 mL/min    Comment: (NOTE) The eGFR has been calculated using the CKD EPI equation. This calculation has not been validated in all clinical situations. eGFR's persistently <60 mL/min signify possible Chronic Kidney Disease.    Anion gap 11 5 - 15  Protime-INR     Status:  None   Collection Time: 08/30/14  8:30 AM  Result Value Ref Range   Prothrombin Time 13.5 11.6 - 15.2 seconds   INR 1.01 0.00 - 1.49    Imaging / Studies: No results found.  Medications / Allergies:  Scheduled Meds: . acetaminophen  1,000 mg Oral TID  . fluconazole  400 mg Oral Daily  . hydrALAZINE  10 mg Oral 3 times per day  . lip balm  1 application Topical BID  . mesalamine  4.8 g Oral Q breakfast  . piperacillin-tazobactam (ZOSYN)  IV  3.375 g Intravenous 3 times per day  . predniSONE  20 mg Oral QAC breakfast  . psyllium  1 packet Oral BID  . saccharomyces boulardii  250 mg Oral BID  . sodium chloride  3 mL Intravenous Q12H  . sodium chloride  3 mL Intravenous Q12H   Continuous Infusions:  PRN Meds:.sodium chloride, alum & mag hydroxide-simeth, bismuth subsalicylate, loperamide, magic mouthwash, menthol-cetylpyridinium, morphine injection, ondansetron, ondansetron **OR** ondansetron (ZOFRAN) IV, oxyCODONE, phenol, sodium chloride  Antibiotics: Anti-infectives    Start     Dose/Rate Route Frequency Ordered Stop   08/28/14 0000  fluconazole (DIFLUCAN) 200 MG tablet     400 mg Oral Daily 08/28/14 1052     08/28/14 0000  amoxicillin-clavulanate (AUGMENTIN) 875-125 MG per tablet     1 tablet Oral 2 times daily 08/28/14 1052     08/25/14 2000  vancomycin (VANCOCIN) IVPB 1000 mg/200 mL premix  Status:  Discontinued     1,000 mg 200 mL/hr over 60 Minutes Intravenous Every 12 hours 08/25/14 1009 08/26/14 0949   08/25/14 1200  fluconazole (DIFLUCAN) tablet 400 mg     400 mg Oral Daily 08/25/14 0943     08/23/14 1000  vancomycin (VANCOCIN) IVPB 750 mg/150 ml premix  Status:  Discontinued     750 mg 150 mL/hr over 60 Minutes Intravenous Every 12 hours 08/22/14 2127 08/25/14 1009   08/23/14 0600  piperacillin-tazobactam (ZOSYN)  IVPB 3.375 g     3.375 g 12.5 mL/hr over 240 Minutes Intravenous 3 times per day 08/22/14 2124     08/22/14 2130  piperacillin-tazobactam (ZOSYN) IVPB  3.375 g     3.375 g 100 mL/hr over 30 Minutes Intravenous NOW 08/22/14 2124 08/22/14 2220   08/22/14 2130  [MAR Hold]  vancomycin (VANCOCIN) 2,000 mg in sodium chloride 0.9 % 500 mL IVPB     (MAR Hold since 08/22/14 2338)   2,000 mg 250 mL/hr over 120 Minutes Intravenous NOW 08/22/14 2127 08/23/14 0008       Assessment/Plan Ulcerative colitis POD#8 I&D perirectal abscess---Dr. Zella Richer i think we need to start packing as the cavity is fairly large and had some bleeding. Will start BID wet to dry dressing changes and PRN when soiled. Continue with hydrotherapy. Mobilize Bulk up stool. Appreciate GI eval, now on steroids and mesalamine  Watch for bleeding with steroids Needs to mobilize  ID-per ID and primary team. Dispo-possible IVC filter today   Erby Pian, ANP-BC [facility] Pager [phone](7A-4:30P)   08/30/2014 9:04 AM

## 2014-08-30 NOTE — Progress Notes (Signed)
[facility] PROGRESS NOTE  [name] OHY:[id number] DOB: May 18, 1940 DOA: 08/22/2014 PCP: Kandice Hams, MD  Assessment/Plan:  Sepsis secondary to peri-rectal abscess / leukocytosis  - Sepsis criteria met on the admission with tachycardia, tachypnea, hypotension, leukocytosis. Source of infection is perirectal abscess. - S/P incision and drainage by surgery 08/23/2014 - Patient was started on vancomycin and Zosyn at the time of the admission. Abscess culture demonstrated yeast but no staph aureus. There are other morphologies present but none predominant. Blood cultures to date are negative.  - Vancomycin was stopped 08/26/2014. Currently on Zosyn and fluconazole. - I spoke with Dr. Megan Salon of infectious disease who recommended total duration of antibiotics - 14 days. Once she is stable for discharge she can be switched to Augmentin and fluconazole by mouth.   Acute blood loss anemia / active rectal bleed - Patient had a lot of rectal bleed after incision and drainage done 08/23/2014. Initially we thought this is postprocedural however she continues to have blood per rectum. -GI consulted and pt started on prednisone and mesalamine - Today hemoglobin is 9.5   Essential hypertension - SBP was elevated in 150s - Added hydralazine for better BP control  - Today much control of SBP 120s to 130s   Left lower extremity DVT - Noted on LLE doppler - Not a candidate for anticoagulation due to ongoing active rectal bleed -  status post IVC filter placement   Ulcerative colitis with abscess / diarrhea  - Recently admitted and treated for ulcerative colitis flareup. - Has had recent flexible sigmoidoscopy 08/08/2014 with findings consistent with ulcerative colitis. - Appreciate the GI recommendations; Pt started on prednisone and mesalamine  - Stool for C.diff negative    Thrombocytopenia - Mild, possibly from rectal bleeed - Platelets normalized.   Hyponatremia -  Likely related to acute infection, sepsis - Sodium stable at 131 - 132 -Today sodium 133   Hypokalemia - Likely from acute infection, GI losses - Supplemented  - Today potassium 3.6   Generalized weakness  - Patient will be discharged to skilled nursing facility once medically stable.   DVT Prophylaxis  - SCD's bilaterally due to risk of bleeding    Code Status: Full code Family Communication: *No family at bedside Disposition Plan: Home when medically stable   Consultants:  Gastroenterology  Surgery  Interventional radiology  Procedures:  IVC filter present  PICC line  Antibiotics: Vancomycin 08/22/2014 --> 08/26/2014 Zosyn 08/22/2014 --> Fluconazole 08/25/2014 -->  HPI/Subjective: 74 y.o. female with past medical history of dyslipidemia, hypertension, ulcerative colitis and recently hospitalized from 08/07/2014 through 08/21/2014 for ulcerative colitis flare who presented to [facility] with perianal pain started the night prior to the admission. CT scan on the admission demonstrated a complex perirectal infection extending up toward the retroperitoneal area. On her recent admission she had flexible sigmoidoscopy 08/08/2014 which demonstrated inflammatory changes from the rectum consistent with ulcerative colitis.  Patient is status post incision and drainage of the large issue rectal abscess on the right side. She was started on vancomycin and Zosyn. Postprocedure she was doing fine but has developed large amount of bleed on the morning of 08/23/2014. She was transferred out of the stepdown unit 08/24/2014. Hospital course is complicated with ongoing rectal bleed so GI was consulted and pt was started on prednisone and mesalamine. Additionally, she was found to have left LE DVT On venous doppler done 7/12.   This morning patient denies any complaints. She went down for IVC filter placement  as was PICC line  Objective: Filed Vitals:   08/30/14 1454  BP:  127/81  Pulse: 110  Temp: 97.5 F (36.4 C)  Resp:     Intake/Output Summary (Last 24 hours) at 08/30/14 1724 Last data filed at 08/30/14 1030  Gross per 24 hour  Intake      0 ml  Output    600 ml  Net   -600 ml   Filed Weights   08/22/14 2113 08/23/14 0153 08/23/14 0434  Weight: 99.791 kg (220 lb) 102.3 kg (225 lb 8.5 oz) 104 kg (229 lb 4.5 oz)    Exam:   General:  Appears in no acute distress  Cardiovascular: S1-S2 regular  Respiratory: Clear to auscultation bilaterally  Abdomen: Soft, nontender, no organomegaly  Musculoskeletal: *No edema of the lower extremities  Data Reviewed: Basic Metabolic Panel:  Recent Labs Lab 08/26/14 0813 08/27/14 0756 08/28/14 0440 08/29/14 1837 08/30/14 0515  NA 131* 132* 131* 132* 133*  K 3.1* 3.2* 3.3* 3.8 3.6  CL 96* 96* 93* 92* 92*  CO2 _0 GLUCOSE 74 95 77 87 84  BUN <5* <5* <5* 6 6  CREATININE 0.71 0.64 0.61 0.64 0.60  CALCIUM 7.2* 7.3* 7.4* 7.9* 7.6*   Liver Function Tests: No results for input(s): AST, ALT, ALKPHOS, BILITOT, PROT, ALBUMIN in the last 168 hours. No results for input(s): LIPASE, AMYLASE in the last 168 hours. No results for input(s): AMMONIA in the last 168 hours. CBC:  Recent Labs Lab 08/25/14 0900 08/26/14 0725 08/28/14 0440 08/29/14 1837 08/30/14 0515  WBC 9.1 8.4 9.6 13.8* 11.6*  HGB 9.4* 9.6* 9.7* 10.2* 9.5*  HCT 30.2* 29.5* 30.4* 31.5* 29.5*  MCV 95.6 94.6 97.1 95.5 95.5  PLT 141* 141* 158 231 219   CBG:  Recent Labs Lab 08/28/14 0811 08/28/14 0843 08/28/14 0903 08/28/14 0951  GLUCAP 60* 56* 66 88    Recent Results (from the past 240 hour(s))  Culture, blood (routine x 2)     Status: None   Collection Time: 08/22/14  9:30 PM  Result Value Ref Range Status   Specimen Description BLOOD LEFT HAND  Final   Special Requests BOTTLES DRAWN AEROBIC ONLY 5CC  Final   Culture   Final    NO GROWTH 5 DAYS Performed at [facility]    Report Status 08/27/2014  FINAL  Final  Anaerobic culture     Status: None   Collection Time: 08/23/14 12:03 AM  Result Value Ref Range Status   Specimen Description ABSCESS PERIRECTAL  Final   Special Requests PATIENT ON FOLLOWING ZOYSN, VANC  Final   Gram Stain   Final    NO WBC SEEN NO SQUAMOUS EPITHELIAL CELLS SEEN MODERATE GRAM NEGATIVE RODS FEW YEAST RARE GRAM POSITIVE COCCI    Culture   Final    NO ANAEROBES ISOLATED Performed at [facility]    Report Status 08/27/2014 FINAL  Final  Culture, routine-abscess     Status: None   Collection Time: 08/23/14 12:03 AM  Result Value Ref Range Status   Specimen Description ABSCESS PERIRECTAL  Final   Special Requests PATIENT ON FOLLOWING ZOYSN, VANC  Final   Gram Stain   Final    NO WBC SEEN NO SQUAMOUS EPITHELIAL CELLS SEEN MODERATE GRAM NEGATIVE RODS RARE YEAST RARE GRAM POSITIVE COCCI    Culture   Final    MULTIPLE ORGANISMS PRESENT, NONE PREDOMINANT Note: NO STAPHYLOCOCCUS AUREUS ISOLATED NO GROUP A STREP (  S.PYOGENES) ISOLATED Performed at [facility]    Report Status 08/26/2014 FINAL  Final  MRSA PCR Screening     Status: None   Collection Time: 08/23/14  1:32 AM  Result Value Ref Range Status   MRSA by PCR NEGATIVE NEGATIVE Final    Comment:        The GeneXpert MRSA Assay (FDA approved for NASAL specimens only), is one component of a comprehensive MRSA colonization surveillance program. It is not intended to diagnose MRSA infection nor to guide or monitor treatment for MRSA infections.   Culture, blood (routine x 2)     Status: None   Collection Time: 08/23/14  4:55 AM  Result Value Ref Range Status   Specimen Description BLOOD LEFT HAND  Final   Special Requests BOTTLES DRAWN AEROBIC ONLY 5ML  Final   Culture   Final    NO GROWTH 5 DAYS Performed at [facility]    Report Status 08/28/2014 FINAL  Final  Clostridium Difficile by PCR (not at [facility])     Status: None   Collection Time: 08/28/14  7:11 PM   Result Value Ref Range Status   C difficile by pcr NEGATIVE NEGATIVE Final     Studies: Ir Fluoro Guide Cv Line Right  08/30/2014   INDICATION: Poor venous access. In need of intravenous access for medication administration and blood draws.  Patient with complex past medical history significant for CAD, cerebrovascular disease, DM, dyslipidemia, hypertension, ulcerative colitis and recent hospitalization from 08/07/2014 through 08/21/2014 for ulcerative colitis flare who presented to [facility] with perianal pain which started the night prior to the admission. CT scan on admission demonstrated a complex perirectal infection extending up toward the retroperitoneal area. She underwent I&D of the rectal abscess on 08/22/2014 but has since had issues with persistent rectal bleeding.  Patient developed a left lower extremity pain and swelling for which a left lower extremity venous Doppler ultrasound was performed on 08/29/2014 and demonstrating a left peroneal and proximal calf DVT.  As such, request now received for IVC filter placement due to anticoagulation contraindication.  EXAM: 1. ULTRASOUND GUIDANCE FOR VASCULAR ACCESS 2. IVC CATHETERIZATION AND VENOGRAM 3. IVC FILTER INSERTION 4. ULTRASOUND FLUOROSCOPIC GUIDED RIGHT BRACHIAL VEIN PICC LINE PLACEMENT  COMPARISON:  CT the abdomen and pelvis - 08/22/2014  MEDICATIONS: Fentanyl 50 mcg IV; Versed 2 mg IV  ANESTHESIA/SEDATION: Sedation Time  32 minutes  CONTRAST:  None. The inferior vena cavagram was performed with CO2 given history of contrast allergy  FLUOROSCOPY TIME:  1 minute (226 mGy)  COMPLICATIONS: None immediate  PROCEDURE: Informed consent was obtained from the patient following explanation of the procedure, risks, benefits and alternatives for both IVC filter placement as well as PICC line placement. The patient understands, agrees and consents for both procedures. All questions were addressed. A time out was performed prior to the  initiation of the procedure.  The right upper extremity was prepped with chlorhexidine in a sterile fashion, and a sterile drape was applied covering the operative field. Maximum barrier sterile technique with sterile gowns and gloves were used for the procedure. A timeout was performed prior to the initiation of the procedure. Local anesthesia was provided with 1% lidocaine.  Under direct ultrasound guidance, the right brachial vein was accessed with a micropuncture kit after the overlying soft tissues were anesthetized with 1% lidocaine. Incidental note was made of a proximal bifurcation of the brachial artery below the level of the elbow joint. An  ultrasound image was saved for documentation purposes. A guidewire was advanced to the level of the superior caval-atrial junction for measurement purposes and the PICC line was cut to length. A peel-away sheath was placed and a 39 cm, 5 [place], dual lumen was inserted to level of the superior caval-atrial junction. A post procedure spot fluoroscopic was obtained. The catheter easily aspirated and flushed and was sutured in place. A dressing was placed. The patient tolerated the procedure well without immediate post procedural complication.  Attention was now paid towards placement of the IVC filter.  Again, maximal barrier sterile technique utilized including caps, mask, sterile gowns, sterile gloves, large sterile drape, hand hygiene, and Betadine prep.  Under sterile condition and local anesthesia, right internal jugular venous access was performed with ultrasound. An ultrasound image was saved and sent to PACS. Over a guidewire, the IVC filter delivery sheath and inner dilator were advanced into the IVC just above the IVC bifurcation. CO2 injection was performed for an IVC venogram.  Through the delivery sheath, a retrievable Denali IVC filter was deployed below the level of the renal veins and above the IVC bifurcation. Limited post deployment venacavagram was  performed.  The delivery sheath was removed and hemostasis was obtained with manual compression. A dressing was placed.  The patient tolerated the above procedures well without immediate post procedural complication.  FINDINGS: After PICC line placement, the tip terminates within the superior cavoatrial junction.  The IVC is patent. No evidence of thrombus, stenosis, or occlusion. No variant venous anatomy. Successful placement of the IVC filter below the level of the renal veins.  IMPRESSION: 1. Successful placement of a right brachial vein approach dual lumen PICC line with tip terminating within the superior cavoatrial junction. The PICC line is ready for immediate use. 2. Successful ultrasound and fluoroscopically guided placement of an infrarenal retrievable IVC filter via right jugular approach.  PLAN: Due to patient related comorbidities and/or clinical necessity, this IVC filter should be considered a permanent device. This patient will not be actively followed for future filter retrieval.   Electronically Signed   By: Sandi Mariscal M.D.   On: 08/30/2014 14:14   Ir US Guide Vasc Access Right  08/30/2014   INDICATION: Poor venous access. In need of intravenous access for medication administration and blood draws.  Patient with complex past medical history significant for CAD, cerebrovascular disease, DM, dyslipidemia, hypertension, ulcerative colitis and recent hospitalization from 08/07/2014 through 08/21/2014 for ulcerative colitis flare who presented to [facility] with perianal pain which started the night prior to the admission. CT scan on admission demonstrated a complex perirectal infection extending up toward the retroperitoneal area. She underwent I&D of the rectal abscess on 08/22/2014 but has since had issues with persistent rectal bleeding.  Patient developed a left .  IMPRESSION: 1. Successful placement of a right brachial vein approach dual lumen PICC line with tip terminating within the  superior cavoatrial junction. The PICC line is ready for immediate use. 2. Successful ultrasound and fluoroscopically guided placement of an infrarenal retrievable IVC filter via right jugular approach.  PLAN: Due to patient related comorbidities and/or clinical necessity, this IVC filter should be considered a permanent device. This patient will not be actively followed for future filter retrieval.   Electronically Signed   By: Sandi Mariscal M.D.   On: 08/30/2014 14:14   Ir Radiologist Eval & Mgmt  08/30/2014   INDICATION: Poor venous access. In need of intravenous access for medication administration and  blood draws.  Patient with complex past medical history significant for CAD, cerebrovascular disease, DM, dyslipidemia, hypertension, ulcerative colitis and recent hospitalization from 08/07/2014 through 08/21/2014 for ulcerative colitis flare who presented to [facility] with perianal pain which started the night prior to the admission. CT scan on admission demonstrated a complex perirectal infection extending up toward the retroperitoneal area. She underwent I&D of the rectal abscess on 08/22/2014 but has since had issues with persistent rectal bleeding.  Patient developed a left lower extremity pain and swelling for which a left lower extremity venous Doppler ultrasound was performed on 08/29/2014 and demonstrating a left peroneal and proximal calf DVT.  As such, request now received for IVC filter placement due to anticoagulation contraindication.    IMPRESSION: 1. Successful placement of a right brachial vein approach dual lumen PICC line with tip terminating within the superior cavoatrial junction. The PICC line is ready for immediate use. 2. Successful ultrasound and fluoroscopically guided placement of an infrarenal retrievable IVC filter via right jugular approach.  PLAN: Due to patient related comorbidities and/or clinical necessity, this IVC filter should be considered a permanent device. This  patient will not be actively followed for future filter retrieval.   Electronically Signed   By: Sandi Mariscal M.D.   On: 08/30/2014 14:14    Scheduled Meds: . acetaminophen  1,000 mg Oral TID  . fluconazole  400 mg Oral Daily  . hydrALAZINE  10 mg Oral 3 times per day  . lidocaine-EPINEPHrine      . lip balm  1 application Topical BID  . mesalamine  4.8 g Oral Q breakfast  . piperacillin-tazobactam (ZOSYN)  IV  3.375 g Intravenous 3 times per day  . predniSONE  20 mg Oral QAC breakfast  . psyllium  1 packet Oral BID  . saccharomyces boulardii  250 mg Oral BID  . sodium chloride  3 mL Intravenous Q12H  . sodium chloride  3 mL Intravenous Q12H   Continuous Infusions:   Principal Problem:   Abscess, perirectal s/p I&D 08/23/2014 Active Problems:   Essential hypertension   Inflammatory bowel disease (IBD) with colitis   Sepsis   Infection due to yeast   Acute blood loss anemia   Leukocytosis   Hypokalemia   Hyponatremia   General weakness   Thrombocytopenia   Ulcerative colitis   DVT (deep venous thrombosis)   DVT (deep vein thrombosis) in pregnancy    Time spent: 25 min    [name] Pager [phone]*. If 7PM-7AM, please contact night-coverage at [email or web address], password [facility] 08/30/2014, 5:24 PM  LOS: 8 days

## 2014-08-30 NOTE — Progress Notes (Signed)
Clinical Social Work  Patient was discussed during progression meeting and patient requires IVC filter today. Per MD, patient is not medically stable to DC. CSW will continue to follow to assist with DC planning to SNF once stable.  [place], [place] [phone]

## 2014-08-30 NOTE — Consult Note (Signed)
Chief Complaint: Chief Complaint  Patient presents with  . Abdominal Pain  . Rectal Bleeding    Referring Physician(s): TRH  History of Present Illness: [name] is a 74 y.o. female with past medical history of CAD, cerebrovascular disease, DM, dyslipidemia, hypertension, ulcerative colitis and recent hospitalization from 08/07/2014 through 08/21/2014 for ulcerative colitis flare who presented to [facility] with perianal pain which started the night prior to the admission. CT scan on admission demonstrated a complex perirectal infection extending up toward the retroperitoneal area. On her recent admission she had flexible sigmoidoscopy 08/08/2014 which demonstrated inflammatory changes from the rectum consistent with ulcerative colitis. She underwent I&D of the rectal abscess on 08/22/2014 but has since had issues with persistent rectal bleeding. LE venous doppler done 08/29/2014 revealed left peroneal and prox calf DVT. Request now received for IVC filter placement due to anticoagulation contraindication.   Past Medical History  Diagnosis Date  . CAD (coronary artery disease)     unspecified  . Cerebrovascular disease   . Hyperlipidemia, mixed   . HTN (hypertension)   . Ulcerative colitis   . Diverticulitis   . Colon polyps 2006  . Thyroid disease   . Myocardial infarct   . Carotid artery occlusion 2010  . Diabetes mellitus without complication     Borderline  . GERD (gastroesophageal reflux disease)   . Arthritis     Past Surgical History  Procedure Laterality Date  . Cholecystectomy  7.06.2008    with cholangiogram  . 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 Abdominal hysterectomy    . Colonoscopy  multiple  . Sigmoidoscopy  multiple  . Flexible sigmoidoscopy N/A 08/08/2014    Procedure: FLEXIBLE SIGMOIDOSCOPY;  Surgeon: Gatha Mayer, MD;  Location: [place];  Service: Endoscopy;  Laterality: N/A;  . Incision and drainage perirectal abscess N/A  08/22/2014    Procedure: IRRIGATION AND DEBRIDEMENT PERIRECTAL ABSCESS;  Surgeon: Jackolyn Confer, MD;  Location: [facility];  Service: General;  Laterality: N/A;    Allergies: Clindamycin/lincomycin and Ivp dye  Medications: Prior to Admission medications   Medication Sig Start Date End Date Taking? Authorizing Provider  CRESTOR 40 MG tablet TAKE 1 TABLET AT BEDTIME Patient taking differently: TAKE 40 MG BY MOUTH AT BEDTIME 05/17/14  Yes Lelon Perla, MD  diphenoxylate-atropine (LOMOTIL) 2.5-0.025 MG per tablet Take 2 tablets by mouth 4 (four) times daily -  before meals and at bedtime. 08/21/14  Yes Domenic Polite, MD  mesalamine (LIALDA) 1.2 G EC tablet take 4 tablets by mouth once daily Patient taking differently: Take 4.8 g by mouth daily.  07/31/14  Yes Inda Castle, MD  metoprolol tartrate (LOPRESSOR) 25 MG tablet take 1 tablet by mouth twice a day Patient taking differently: TAKE 25 MG BY MOUTH TWICE DAILY 05/18/14  Yes Lelon Perla, MD  predniSONE (DELTASONE) 10 MG tablet Take 2 tablets (20 mg total) by mouth 2 (two) times daily. 08/06/14  Yes Milus Banister, MD  saccharomyces boulardii (FLORASTOR) 250 MG capsule Take 1 capsule (250 mg total) by mouth 2 (two) times daily. 08/03/14  Yes Amy S [name], PA-C  acetaminophen (TYLENOL) 500 MG tablet Take 2 tablets (1,000 mg total) by mouth every 4 (four) hours as needed for mild pain or moderate pain. 08/28/14   Robbie Lis, MD  amoxicillin-clavulanate (AUGMENTIN) 875-125 MG per tablet Take 1 tablet by mouth 2 (two) times daily. 08/28/14   Robbie Lis, MD  fluconazole (  DIFLUCAN) 200 MG tablet Take 2 tablets (400 mg total) by mouth daily. 08/28/14   Robbie Lis, MD  hydrochlorothiazide (MICROZIDE) 12.5 MG capsule Take 12.5 mg by mouth daily. 08/16/14   Historical Provider, MD  oxyCODONE (OXY IR/ROXICODONE) 5 MG immediate release tablet Take 1 tablet (5 mg total) by mouth every 4 (four) hours as needed for moderate pain, severe pain or  breakthrough pain. 08/28/14   Robbie Lis, MD  psyllium (HYDROCIL/METAMUCIL) 95 % PACK Take 1 packet by mouth 2 (two) times daily. 08/28/14   Robbie Lis, MD     Family History  Problem Relation Age of Onset  . Colitis Brother     undefined  . Stomach cancer Mother   . Stroke Father     died  . Colon cancer Neg Hx     History   Social History  . Marital Status: Married    Spouse Name: N/A  . Number of Children: N/A  . Years of Education: N/A   Occupational History  . retired    Social History Main Topics  . Smoking status: Former Smoker -- 0.30 packs/day for 40 years    Types: Cigarettes    Quit date: 02/17/2013  . Smokeless tobacco: Never Used  . Alcohol Use: No  . Drug Use: No  . Sexual Activity: Not on file   Other Topics Concern  . None   Social History Narrative      Review of Systems  Constitutional: Positive for fatigue. Negative for fever and chills.  Respiratory: Positive for shortness of breath. Negative for cough.   Cardiovascular: Positive for leg swelling. Negative for chest pain.  Gastrointestinal: Positive for abdominal pain and blood in stool. Negative for nausea and vomiting.  Genitourinary: Negative for dysuria.  Musculoskeletal: Negative for back pain.  Hematological: Bruises/bleeds easily.    Vital Signs: BP 110/81 mmHg  Pulse 119  Temp(Src) 98.1 F (36.7 C) (Oral)  Resp 18  Ht 5\' 9"  (1.753 m)  Wt 229 lb 4.5 oz (104 kg)  BMI 33.84 kg/m2  SpO2 94%  Physical Exam  Constitutional: She is oriented to person, place, and time. She appears well-developed and well-nourished.  Cardiovascular:  Tachy, irregular  Pulmonary/Chest: Effort normal.  Sl dim BS bases  Abdominal: Soft. Bowel sounds are normal. There is tenderness.  Musculoskeletal: She exhibits edema.  Neurological: She is alert and oriented to person, place, and time.    Mallampati Score:     Imaging: Ct Abdomen Pelvis W Contrast  08/22/2014   CLINICAL DATA:  Patient  with abdominal pain and rectal bleeding beginning this morning. Patient with recent ulcerative colitis flare up.  EXAM: CT ABDOMEN AND PELVIS WITH CONTRAST  TECHNIQUE: Multidetector CT imaging of the abdomen and pelvis was performed using the standard protocol following bolus administration of intravenous contrast.  CONTRAST:  162mL OMNIPAQUE IOHEXOL 300 MG/ML  SOLN  COMPARISON:  CT 08/20/2006.  FINDINGS: Lower chest: Dependent atelectasis left lower lobe. Normal heart size. Small hiatal hernia.  Hepatobiliary: Liver is normal in size and contour without focal hepatic lesion identified. Patient status post cholecystectomy.  Pancreas: Unremarkable  Spleen: Unremarkable  Adrenals/Urinary Tract: Stable thickening left adrenal gland. 2.2 cm left renal cyst. Additional tiny bilateral lower attenuation renal lesions too small to characterize.  Stomach/Bowel: There is wall thickening at the level of the rectum. Additionally, best demonstrated on the coronal images (image 63; series 5) there is suggestion of probable perforation and adjacent small perirectal abscess. Wall thickening  of the distal transverse and descending colon. There is an extensive amount of gas within the right gluteal soft tissues at the level of the rectum extending to the gluteal musculature and cranially throughout the retroperitoneum.  Vascular/Lymphatic: Extensive calcified atherosclerotic plaque involving the abdominal aorta. Infrarenal abdominal aortic ectasia, 2.2 cm. No retroperitoneal lymphadenopathy.  Other: Catheter is present within the vagina. Contrast demonstrated within the vagina.  Musculoskeletal: Lower lumbar spine degenerative changes. No aggressive or acute appearing osseous lesions. Extensive amount of soft tissue gas within the right gluteal subcutaneous fat extending into the gluteal musculature. Additionally this gas extends cranially along the retroperitoneum. Soft tissue thickening of the right subcutaneous gluteal soft  tissues.  IMPRESSION: Extensive soft tissue gas within the right gluteal fat extending into the right gluteal musculature as well as extending cranially throughout the retroperitoneum. Findings are concerning for an aggressive infectious process in the setting of retroperitoneal fasciitis. Potential causative etiology may be distal colitis with perforation and associated abscess formation. Recommend surgical consultation and broad-spectrum antibiotic therapy.  Wall thickening of the distal transverse and descending colon likely secondary to colitis.  Critical Value/emergent results were called by telephone at the time of interpretation on 08/22/2014 at 8:57 pm to [facility] , who verbally acknowledged these results.   Electronically Signed   By: Lovey Newcomer M.D.   On: 08/22/2014 21:09    Labs:  CBC:  Recent Labs  08/26/14 0725 08/28/14 0440 08/29/14 1837 08/30/14 0515  WBC 8.4 9.6 13.8* 11.6*  HGB 9.6* 9.7* 10.2* 9.5*  HCT 29.5* 30.4* 31.5* 29.5*  PLT 141* 158 231 219    COAGS:  Recent Labs  08/22/14 1610 08/23/14 0345 08/30/14 0830  INR 1.28 1.34 1.01  APTT 23*  --   --     BMP:  Recent Labs  08/27/14 0756 08/28/14 0440 08/29/14 1837 08/30/14 0515  NA 132* 131* 132* 133*  K 3.2* 3.3* 3.8 3.6  CL 96* 93* 92* 92*  CO2 29 30 29 30   GLUCOSE 95 77 87 84  BUN <5* <5* 6 6  CALCIUM 7.3* 7.4* 7.9* 7.6*  CREATININE 0.64 0.61 0.64 0.60  GFRNONAA >60 >60 >60 >60  GFRAA >60 >60 >60 >60    LIVER FUNCTION TESTS:  Recent Labs  08/07/14 1201 08/08/14 0538 08/22/14 1610 08/23/14 0345  BILITOT 0.6 0.4 0.8 0.7  AST 18 17 18 15   ALT 12* 11* 14 13*  ALKPHOS 71 65 57 46  PROT 6.9 6.3* 5.5* 4.9*  ALBUMIN 2.8* 2.7* 2.3* 2.0*    TUMOR MARKERS: No results for input(s): AFPTM, CEA, CA199, CHROMGRNA in the last 8760 hours.  Assessment and Plan: Pt with UC and recent I&D of perirectal abscess with persistent rectal bleeding, new acute LLE DVT; plan is for IVC filter  placement today. Risks and benefits discussed with the patient including, but not limited to bleeding, infection, contrast induced renal failure, filter fracture or migration which can lead to emergency surgery or even death, strut penetration with damage or irritation to adjacent structures and caval thrombosis. All of the patient's questions were answered, patient is agreeable to proceed.Consent signed and in chart. Pt noted to have IVP dye allergy but had contrasted CT 08/22/14 without difficulty (she is on prednisone for UC now).         Signed: D. Rowe Robert 08/30/2014, 10:15 AM   I spent a total of 40 minutes  in face to face in clinical consultation, greater than 50% of which was  counseling/coordinating care for IVC filter placement

## 2014-08-30 NOTE — Progress Notes (Signed)
    Progress Note   Subjective  still having significant rectal pain. Per nurse, still passing blood from wound and also in stool. Stools are loose.    Objective   Vital signs in last 24 hours: Temp:  [98 F (36.7 C)-98.2 F (36.8 C)] 98.1 F (36.7 C) (07/13 8413) Pulse Rate:  [79-119] 119 (07/13 0900) Resp:  [18] 18 (07/13 0613) BP: (110-150)/(65-81) 110/81 mmHg (07/13 0900) SpO2:  [93 %-98 %] 94 % (07/13 0900) Last BM Date: 08/29/14 General:    Pleasant black female, appears physically uncomfortable Abdomen:  Soft, nontender and nondistended. Normal bowel sounds. Extremities:  Without edema. Neurologic:  Alert and oriented,  grossly normal neurologically. Psych:  Cooperative. Normal mood and affect.  Intake/Output from previous day: 07/12 0701 - 07/13 0700 In: -  Out: 500 [Urine:500] Intake/Output this shift: Total I/O In: 0  Out: 600 [Urine:600]  Lab Results:  Recent Labs  08/28/14 0440 08/29/14 1837 08/30/14 0515  WBC 9.6 13.8* 11.6*  HGB 9.7* 10.2* 9.5*  HCT 30.4* 31.5* 29.5*  PLT 158 231 219   BMET  Recent Labs  08/28/14 0440 08/29/14 1837 08/30/14 0515  NA 131* 132* 133*  K 3.3* 3.8 3.6  CL 93* 92* 92*  CO2 30 29 30   GLUCOSE 77 87 84  BUN <5* 6 6  CREATININE 0.61 0.64 0.60  CALCIUM 7.4* 7.9* 7.6*       Assessment / Plan:    1.Longstanding ulcerative colitis admitted with large ischio perirectal abscess, s/p I&D 7/516.  She continues to have rectal pain, bleeding and loose stools.  We cautiously restarted prednisone two days ago. She has severe colitis and is going to need escalation of therapy. We spoke with Surgery about starting Remicade. realizing that it isn't ideal in setting of her rectal abscess. Biologics may be less likely however to interfere with wound healing than Prednisone would be. Despite bleeding her hgb is stable.    2. New DVT - left peroneal vein / calf vien. For IVC    LOS: 8 days   Tye Savoy  08/30/2014, 11:19  AM

## 2014-08-30 NOTE — Procedures (Signed)
Successful placement of right brachial vein approach dual lumen PICC line with tip at the superior caval-atrial junction.   The PICC line is ready for immediate use. Successful placement of an infrarenal IVC filter.  No immediate complications.

## 2014-08-31 ENCOUNTER — Inpatient Hospital Stay (HOSPITAL_COMMUNITY): Payer: Commercial Managed Care - HMO

## 2014-08-31 DIAGNOSIS — R52 Pain, unspecified: Secondary | ICD-10-CM

## 2014-08-31 DIAGNOSIS — I82403 Acute embolism and thrombosis of unspecified deep veins of lower extremity, bilateral: Secondary | ICD-10-CM

## 2014-08-31 LAB — CBC
HCT: 25.5 % — ABNORMAL LOW (ref 36.0–46.0)
Hemoglobin: 8.3 g/dL — ABNORMAL LOW (ref 12.0–15.0)
MCH: 31.1 pg (ref 26.0–34.0)
MCHC: 32.5 g/dL (ref 30.0–36.0)
MCV: 95.5 fL (ref 78.0–100.0)
Platelets: 242 10*3/uL (ref 150–400)
RBC: 2.67 MIL/uL — AB (ref 3.87–5.11)
RDW: 15.2 % (ref 11.5–15.5)
WBC: 14.1 10*3/uL — AB (ref 4.0–10.5)

## 2014-08-31 LAB — PROTIME-INR
INR: 1 (ref 0.00–1.49)
Prothrombin Time: 13.4 seconds (ref 11.6–15.2)

## 2014-08-31 LAB — GLUCOSE, CAPILLARY: Glucose-Capillary: 123 mg/dL — ABNORMAL HIGH (ref 65–99)

## 2014-08-31 MED ORDER — DILTIAZEM HCL 25 MG/5ML IV SOLN
5.0000 mg | Freq: Once | INTRAVENOUS | Status: DC
  Filled 2014-08-31: qty 5

## 2014-08-31 MED ORDER — NYSTATIN 100000 UNIT/GM EX POWD
Freq: Two times a day (BID) | CUTANEOUS | Status: DC
  Administered 2014-08-31: 1 via TOPICAL
  Administered 2014-08-31: 23:00:00 via TOPICAL
  Administered 2014-09-01: 1 via TOPICAL
  Administered 2014-09-01 – 2014-09-21 (×38): via TOPICAL
  Filled 2014-08-31: qty 15

## 2014-08-31 MED ORDER — COLESTIPOL HCL 1 G PO TABS
3.0000 g | ORAL_TABLET | Freq: Two times a day (BID) | ORAL | Status: DC
  Filled 2014-08-31: qty 3

## 2014-08-31 MED ORDER — COLESTIPOL HCL 1 G PO TABS
3.0000 g | ORAL_TABLET | Freq: Two times a day (BID) | ORAL | Status: DC
  Administered 2014-08-31 – 2014-09-04 (×7): 3 g via ORAL
  Filled 2014-08-31 (×13): qty 3

## 2014-08-31 MED ORDER — DIPHENHYDRAMINE HCL 50 MG PO CAPS
50.0000 mg | ORAL_CAPSULE | Freq: Once | ORAL | Status: AC
Start: 2014-09-01 — End: 2014-09-01
  Administered 2014-09-01: 50 mg via ORAL
  Filled 2014-08-31: qty 1

## 2014-08-31 MED ORDER — ACETAMINOPHEN 325 MG PO TABS: 650.0000 mg | ORAL_TABLET | Freq: Once | ORAL | Status: DC

## 2014-08-31 MED ORDER — SODIUM CHLORIDE 0.9 % IV SOLN
5.0000 mg/kg | Freq: Once | INTRAVENOUS | Status: AC
  Administered 2014-09-01: 500 mg via INTRAVENOUS
  Filled 2014-08-31: qty 50

## 2014-08-31 MED ORDER — SODIUM CHLORIDE 0.9 % IJ SOLN
10.0000 mL | INTRAMUSCULAR | Status: DC | PRN
  Administered 2014-09-01 – 2014-09-19 (×12): 10 mL
  Filled 2014-08-31 (×12): qty 40

## 2014-08-31 MED ORDER — SODIUM CHLORIDE 0.9 % IJ SOLN
10.0000 mL | Freq: Two times a day (BID) | INTRAMUSCULAR | Status: DC
  Administered 2014-08-31 – 2014-09-18 (×12): 10 mL

## 2014-08-31 NOTE — Progress Notes (Signed)
    Progress Note   Subjective  feels about the same as yesterday. Still incontinent of loose stool with blood.   Objective   Vital signs in last 24 hours: Temp:  [97.3 F (36.3 C)-98.7 F (37.1 C)] 98 F (36.7 C) (07/14 0408) Pulse Rate:  [84-124] 84 (07/14 0408) Resp:  [10-20] 20 (07/14 0408) BP: (112-169)/(56-97) 112/56 mmHg (07/14 0408) SpO2:  [91 %-100 %] 100 % (07/14 0408) Last BM Date: 08/30/14 General:    black female in NAD. When I pulled back covers she was sitting a liquid brown stool containing blood.  Abdomen:  Soft, nontender and nondistended. Normal bowel sounds. Extremities:  Without edema. Neurologic:  Alert and oriented,  grossly normal neurologically. Psych:  Cooperative. Normal mood and affect.    Lab Results:  Recent Labs  08/29/14 1837 08/30/14 0515 08/31/14 0500  WBC 13.8* 11.6* 14.1*  HGB 10.2* 9.5* 8.3*  HCT 31.5* 29.5* 25.5*  PLT 231 219 242   BMET  Recent Labs  08/29/14 1837 08/30/14 0515  NA 132* 133*  K 3.8 3.6  CL 92* 92*  CO2 29 30  GLUCOSE 87 84  BUN 6 6  CREATININE 0.64 0.60  CALCIUM 7.9* 7.6*      Assessment / Plan:     Longstanding ulcerative colitis admitted with large ischio perirectal abscess, s/p I&D 7/516. She continues to have rectal pain, bleeding (from abscess per nursing staff and presumably from colitis as well) We cautiously restarted prednisone three days ago but has severe colitis and is going to need escalation of therapy. We spoke with Surgery about starting Remicade. realizing that it isn't ideal in setting of her rectal abscess. Biologics may be less likely than Prednisone to interfere with wound healing.Patient thinking about this. It is likely that she will get Remicade within next couple of days.      LOS: 9 days   Tye Savoy  08/31/2014, 9:25 AM

## 2014-08-31 NOTE — Progress Notes (Addendum)
RN paged secondary to pt converting to Afib with RVR on monitor. 12 lead EKG revealed Afib with PVCs and HR 106. Review of the chart reveals no noted hx of Afib. Likely secondary to stress of infection. Pt is asymptomatic. Will give one Cardizem 5mg  IV push dose and follow.  Clance Boll, NP [facility] Update: Pt converted to NSR without dose of Cardizem. Cardizem order d/c'd.  KJKG, NP

## 2014-08-31 NOTE — Progress Notes (Signed)
[facility] PROGRESS NOTE  [name] VANDERVOORT UTM:[id number] DOB: [birth year] DOA: 08/22/2014 PCP: Kandice Hams, MD  Assessment/Plan:  Sepsis secondary to peri-rectal abscess / leukocytosis  - Sepsis criteria met on the admission with tachycardia, tachypnea, hypotension, leukocytosis. Source of infection is perirectal abscess. - S/P incision and drainage by surgery 08/23/2014 - Patient was started on vancomycin and Zosyn at the time of the admission. Abscess culture demonstrated yeast but no staph aureus. There are other morphologies present but none predominant. Blood cultures to date are negative.  - Vancomycin was stopped 08/26/2014. Currently on Zosyn and fluconazole. - I spoke with Dr. Megan Salon of infectious disease who recommended total duration of antibiotics - 14 days. Once she is stable for discharge she can be switched to Augmentin and fluconazole by mouth.   Acute blood loss anemia / active rectal bleed - Patient had a lot of rectal bleed after incision and drainage done 08/23/2014. Initially we thought this is postprocedural however she continues to have blood per rectum. -GI consulted and pt started on prednisone and mesalamine - Today hemoglobin is 9.5 - We'll check PT/INR this morning   Essential hypertension - SBP was elevated in 150s - Added hydralazine for better BP control  - Today much control of SBP 120s to 130s   Left lower extremity DVT - Noted on LLE doppler - Not a candidate for anticoagulation due to ongoing active rectal bleed -  status post IVC filter placement  Right lower extremity DVT Venous Doppler of the lower extremity, the right was performed this morning which showed DVT Patient already has IVC filter in place, not a candidate for anticoagulation due to ongoing lower GI bleed.   Ulcerative colitis with abscess / diarrhea  - Recently admitted and treated for ulcerative colitis flareup. - Has had recent flexible sigmoidoscopy 08/08/2014  with findings consistent with ulcerative colitis. - Appreciate the GI recommendations; Pt started on prednisone and mesalamine  - Stool for C.diff negative    Thrombocytopenia - Mild, possibly from rectal bleeed - Platelets normalized.   Hyponatremia - Likely related to acute infection, sepsis - Sodium stable at 131 - 132 -Today sodium 133   Hypokalemia - Likely from acute infection, GI losses - Supplemented  - Last potassium 3.6   Generalized weakness  - Patient will be discharged to skilled nursing facility once medically stable.   DVT Prophylaxis  - SCD's bilaterally due to risk of bleeding    Code Status: Full code Family Communication: *No family at bedside Disposition Plan: Home when medically stable   Consultants:  Gastroenterology  Surgery  Interventional radiology  Procedures:  IVC filter present  PICC line  Antibiotics: Vancomycin 08/22/2014 --> 08/26/2014 Zosyn 08/22/2014 --> Fluconazole 08/25/2014 -->  HPI/Subjective: 74 y.o. female with past medical history of dyslipidemia, hypertension, ulcerative colitis and recently hospitalized from 08/07/2014 through 08/21/2014 for ulcerative colitis flare who presented to [facility] with perianal pain started the night prior to the admission. CT scan on the admission demonstrated a complex perirectal infection extending up toward the retroperitoneal area. On her recent admission she had flexible sigmoidoscopy 08/08/2014 which demonstrated inflammatory changes from the rectum consistent with ulcerative colitis.  Patient is status post incision and drainage of the large issue rectal abscess on the right side. She was started on vancomycin and Zosyn. Postprocedure she was doing fine but has developed large amount of bleed on the morning of 08/23/2014. She was transferred out of the stepdown unit 08/24/2014. Hospital course is complicated with  ongoing rectal bleed so GI was consulted and pt was started on  prednisone and mesalamine. Additionally, she was found to have left LE DVT On venous doppler done 7/12.   Patient complained of swelling and pain in the right lower extremity.  Objective: Filed Vitals:   08/31/14 0408  BP: 112/56  Pulse: 84  Temp: 98 F (36.7 C)  Resp: 20    Intake/Output Summary (Last 24 hours) at 08/31/14 1103 Last data filed at 08/31/14 0900  Gross per 24 hour  Intake    470 ml  Output    550 ml  Net    -80 ml   Filed Weights   08/22/14 2113 08/23/14 0153 08/23/14 0434  Weight: 99.791 kg (220 lb) 102.3 kg (225 lb 8.5 oz) 104 kg (229 lb 4.5 oz)    Exam:   General:  Appears in no acute distress  Cardiovascular: S1-S2 regular  Respiratory: Clear to auscultation bilaterally  Abdomen: Soft, nontender, no organomegaly  Musculoskeletal: *Mild swelling and pain on palpation of right calf, positive Homans sign  Data Reviewed: Basic Metabolic Panel:  Recent Labs Lab 08/26/14 0813 08/27/14 0756 08/28/14 0440 08/29/14 1837 08/30/14 0515  NA 131* 132* 131* 132* 133*  K 3.1* 3.2* 3.3* 3.8 3.6  CL 96* 96* 93* 92* 92*  CO2 _0 GLUCOSE 74 95 77 87 84  BUN <5* <5* <5* 6 6  CREATININE 0.71 0.64 0.61 0.64 0.60  CALCIUM 7.2* 7.3* 7.4* 7.9* 7.6*   Liver Function Tests: No results for input(s): AST, ALT, ALKPHOS, BILITOT, PROT, ALBUMIN in the last 168 hours. No results for input(s): LIPASE, AMYLASE in the last 168 hours. No results for input(s): AMMONIA in the last 168 hours. CBC:  Recent Labs Lab 08/26/14 0725 08/28/14 0440 08/29/14 1837 08/30/14 0515 08/31/14 0500  WBC 8.4 9.6 13.8* 11.6* 14.1*  HGB 9.6* 9.7* 10.2* 9.5* 8.3*  HCT 29.5* 30.4* 31.5* 29.5* 25.5*  MCV 94.6 97.1 95.5 95.5 95.5  PLT 141* 158 231 219 242   CBG:  Recent Labs Lab 08/28/14 0811 08/28/14 0843 08/28/14 0903 08/28/14 0951  GLUCAP 60* 56* 66 88    Recent Results (from the past 240 hour(s))  Culture, blood (routine x 2)     Status: None    Collection Time: 08/22/14  9:30 PM  Result Value Ref Range Status   Specimen Description BLOOD LEFT HAND  Final   Special Requests BOTTLES DRAWN AEROBIC ONLY 5CC  Final   Culture   Final    NO GROWTH 5 DAYS Performed at [facility]    Report Status 08/27/2014 FINAL  Final  Anaerobic culture     Status: None   Collection Time: 08/23/14 12:03 AM  Result Value Ref Range Status   Specimen Description ABSCESS PERIRECTAL  Final   Special Requests PATIENT ON FOLLOWING ZOYSN, VANC  Final   Gram Stain   Final    NO WBC SEEN NO SQUAMOUS EPITHELIAL CELLS SEEN MODERATE GRAM NEGATIVE RODS FEW YEAST RARE GRAM POSITIVE COCCI    Culture   Final    NO ANAEROBES ISOLATED Performed at [facility]    Report Status 08/27/2014 FINAL  Final  Culture, routine-abscess     Status: None   Collection Time: 08/23/14 12:03 AM  Result Value Ref Range Status   Specimen Description ABSCESS PERIRECTAL  Final   Special Requests PATIENT ON FOLLOWING Fanny Dance  Final   Gram Stain   Final  NO WBC SEEN NO SQUAMOUS EPITHELIAL CELLS SEEN MODERATE GRAM NEGATIVE RODS RARE YEAST RARE GRAM POSITIVE COCCI    Culture   Final    MULTIPLE ORGANISMS PRESENT, NONE PREDOMINANT Note: NO STAPHYLOCOCCUS AUREUS ISOLATED NO GROUP A STREP (S.PYOGENES) ISOLATED Performed at [facility]    Report Status 08/26/2014 FINAL  Final  MRSA PCR Screening     Status: None   Collection Time: 08/23/14  1:32 AM  Result Value Ref Range Status   MRSA by PCR NEGATIVE NEGATIVE Final    Comment:        The GeneXpert MRSA Assay (FDA approved for NASAL specimens only), is one component of a comprehensive MRSA colonization surveillance program. It is not intended to diagnose MRSA infection nor to guide or monitor treatment for MRSA infections.   Culture, blood (routine x 2)     Status: None   Collection Time: 08/23/14  4:55 AM  Result Value Ref Range Status   Specimen Description BLOOD LEFT HAND  Final    Special Requests BOTTLES DRAWN AEROBIC ONLY 5ML  Final   Culture   Final    NO GROWTH 5 DAYS Performed at [facility]    Report Status 08/28/2014 FINAL  Final  Clostridium Difficile by PCR (not at [facility])     Status: None   Collection Time: 08/28/14  7:11 PM  Result Value Ref Range Status   C difficile by pcr NEGATIVE NEGATIVE Final     Studies: Ir Fluoro Guide Cv Line Right  08/30/2014   INDICATION: Poor venous access. In need of intravenous access for medication administration and blood draws.  Patient with complex past medical history significant for CAD, cerebrovascular disease, DM, dyslipidemia, hypertension, ulcerative colitis and recent hospitalization from 08/07/2014 through 08/21/2014 for ulcerative colitis flare who presented to [facility] with perianal pain which started the night prior to the admission. CT scan on admission demonstrated a complex perirectal infection extending up toward the retroperitoneal area. She underwent I&D of the rectal abscess on 08/22/2014 but has since had issues with persistent rectal bleeding.  Patient developed a left lower extremity pain and swelling for which a left lower extremity venous Doppler ultrasound was performed on 08/29/2014 and demonstrating a left peroneal and proximal calf DVT.  As such, request now received for IVC filter placement due to anticoagulation contraindication.  EXAM: 1. ULTRASOUND GUIDANCE FOR VASCULAR ACCESS 2. IVC CATHETERIZATION AND VENOGRAM 3. IVC FILTER INSERTION 4. ULTRASOUND FLUOROSCOPIC GUIDED RIGHT BRACHIAL VEIN PICC LINE PLACEMENT  COMPARISON:  CT the abdomen and pelvis - 08/22/2014  MEDICATIONS: Fentanyl 50 mcg IV; Versed 2 mg IV  ANESTHESIA/SEDATION: Sedation Time  32 minutes  CONTRAST:  None. The inferior vena cavagram was performed with CO2 given history of contrast allergy  FLUOROSCOPY TIME:  1 minute (920 mGy)  COMPLICATIONS: None immediate  PROCEDURE: Informed consent was obtained from the patient  following explanation of the procedure, risks, benefits and alternatives for both IVC filter placement as well as PICC line placement. The patient understands, agrees and consents for both procedures. All questions were addressed. A time out was performed prior to the initiation of the procedure.  The right upper extremity was prepped with chlorhexidine in a sterile fashion, and a sterile drape was applied covering the operative field. Maximum barrier sterile technique with sterile gowns and gloves were used for the procedure. A timeout was performed prior to the initiation of the procedure. Local anesthesia was provided with 1% lidocaine.  Under direct  ultrasound guidance, the right brachial vein was accessed with a micropuncture kit after the overlying soft tissues were anesthetized with 1% lidocaine. Incidental note was made of a proximal bifurcation of the brachial artery below the level of the elbow joint. An ultrasound image was saved for documentation purposes. A guidewire was advanced to the level of the superior caval-atrial junction for measurement purposes and the PICC line was cut to length. A peel-away sheath was placed and a 39 cm, 5 [place], dual lumen was inserted to level of the superior caval-atrial junction. A post procedure spot fluoroscopic was obtained. The catheter easily aspirated and flushed and was sutured in place. A dressing was placed. The patient tolerated the procedure well without immediate post procedural complication.  Attention was now paid towards placement of the IVC filter.  Again, maximal barrier sterile technique utilized including caps, mask, sterile gowns, sterile gloves, large sterile drape, hand hygiene, and Betadine prep.  Under sterile condition and local anesthesia, right internal jugular venous access was performed with ultrasound. An ultrasound image was saved and sent to PACS. Over a guidewire, the IVC filter delivery sheath and inner dilator were advanced into the  IVC just above the IVC bifurcation. CO2 injection was performed for an IVC venogram.  Through the delivery sheath, a retrievable Denali IVC filter was deployed below the level of the renal veins and above the IVC bifurcation. Limited post deployment venacavagram was performed.  The delivery sheath was removed and hemostasis was obtained with manual compression. A dressing was placed.  The patient tolerated the above procedures well without immediate post procedural complication.  FINDINGS: After PICC line placement, the tip terminates within the superior cavoatrial junction.  The IVC is patent. No evidence of thrombus, stenosis, or occlusion. No variant venous anatomy. Successful placement of the IVC filter below the level of the renal veins.  IMPRESSION: 1. Successful placement of a right brachial vein approach dual lumen PICC line with tip terminating within the superior cavoatrial junction. The PICC line is ready for immediate use. 2. Successful ultrasound and fluoroscopically guided placement of an infrarenal retrievable IVC filter via right jugular approach.  PLAN: Due to patient related comorbidities and/or clinical necessity, this IVC filter should be considered a permanent device. This patient will not be actively followed for future filter retrieval.   Electronically Signed   By: Sandi Mariscal M.D.   On: 08/30/2014 14:14   Ir US Guide Vasc Access Right  08/30/2014   INDICATION: Poor venous access. In need of intravenous access for medication administration and blood draws.  Patient with complex past medical history significant for CAD, cerebrovascular disease, DM, dyslipidemia, hypertension, ulcerative colitis and recent hospitalization from 08/07/2014 through 08/21/2014 for ulcerative colitis flare who presented to [facility] with perianal pain which started the night prior to the admission. CT scan on admission demonstrated a complex perirectal infection extending up toward the retroperitoneal  area. She underwent I&D of the rectal abscess on 08/22/2014 but has since had issues with persistent rectal bleeding.  Patient developed a left .  IMPRESSION: 1. Successful placement of a right brachial vein approach dual lumen PICC line with tip terminating within the superior cavoatrial junction. The PICC line is ready for immediate use. 2. Successful ultrasound and fluoroscopically guided placement of an infrarenal retrievable IVC filter via right jugular approach.  PLAN: Due to patient related comorbidities and/or clinical necessity, this IVC filter should be considered a permanent device. This patient will not be actively followed for future  filter retrieval.   Electronically Signed   By: Sandi Mariscal M.D.   On: 08/30/2014 14:14   Ir Radiologist Eval & Mgmt  08/30/2014   INDICATION: Poor venous access. In need of intravenous access for medication administration and blood draws.  Patient with complex past medical history significant for CAD, cerebrovascular disease, DM, dyslipidemia, hypertension, ulcerative colitis and recent hospitalization from 08/07/2014 through 08/21/2014 for ulcerative colitis flare who presented to [facility] with perianal pain which started the night prior to the admission. CT scan on admission demonstrated a complex perirectal infection extending up toward the retroperitoneal area. She underwent I&D of the rectal abscess on 08/22/2014 but has since had issues with persistent rectal bleeding.  Patient developed a left lower extremity pain and swelling for which a left lower extremity venous Doppler ultrasound was performed on 08/29/2014 and demonstrating a left peroneal and proximal calf DVT.  As such, request now received for IVC filter placement due to anticoagulation contraindication.    IMPRESSION: 1. Successful placement of a right brachial vein approach dual lumen PICC line with tip terminating within the superior cavoatrial junction. The PICC line is ready for immediate  use. 2. Successful ultrasound and fluoroscopically guided placement of an infrarenal retrievable IVC filter via right jugular approach.  PLAN: Due to patient related comorbidities and/or clinical necessity, this IVC filter should be considered a permanent device. This patient will not be actively followed for future filter retrieval.   Electronically Signed   By: Sandi Mariscal M.D.   On: 08/30/2014 14:14    Scheduled Meds: . acetaminophen  1,000 mg Oral TID  . fluconazole  400 mg Oral Daily  . hydrALAZINE  10 mg Oral 3 times per day  . lip balm  1 application Topical BID  . mesalamine  4.8 g Oral Q breakfast  . nystatin   Topical BID  . piperacillin-tazobactam (ZOSYN)  IV  3.375 g Intravenous 3 times per day  . predniSONE  20 mg Oral QAC breakfast  . psyllium  1 packet Oral BID  . saccharomyces boulardii  250 mg Oral BID  . sodium chloride  10-40 mL Intracatheter Q12H  . sodium chloride  3 mL Intravenous Q12H  . sodium chloride  3 mL Intravenous Q12H   Continuous Infusions:   Principal Problem:   Abscess, perirectal s/p I&D 08/23/2014 Active Problems:   Essential hypertension   Inflammatory bowel disease (IBD) with colitis   Sepsis   Infection due to yeast   Acute blood loss anemia   Leukocytosis   Hypokalemia   Hyponatremia   General weakness   Thrombocytopenia   Ulcerative colitis   DVT (deep venous thrombosis)   DVT (deep vein thrombosis) in pregnancy    Time spent: 25 min    [place] [facility] Pager [phone]*. If 7PM-7AM, please contact night-coverage at [email or web address], password [facility] 08/31/2014, 11:03 AM  LOS: 9 days

## 2014-08-31 NOTE — Progress Notes (Signed)
08/31/14 1600 HYDROTHERAPY TREATMENT.  Subjective Assessment  Subjective "honey, you do whatever it takes to get me better".  Patient and Family Stated Goals I [place] this year.   Date of Onset 08/27/14  Prior Treatments Pt in with flare up of ulverative colitis with paina n diarrhea,a nd pain in perirectal area. I &D on 08/23/2014 for drainage adn opening of perirectal abscess. Penrose drains were in place earlier this week, but have been removed by surgery and hydrotherapy consult put in place.   Evaluation and Treatment  Evaluation and Treatment Procedures Explained to Patient/Family Yes  Evaluation and Treatment Procedures agreed to  [REMOVED] Wound / Incision (Open or Dehisced) 08/27/14 Other (Comment) Rectum Right Right Perirectal wound (HYDRO)  Final Assessment Date/Final Assessment Time: 08/27/14 1200  Date First Assessed/Time First Assessed: 08/27/14 1100   Wound Type: Other (Comment)  Location: Rectum  Location Orientation: Right  Wound Description (Comments): Right Perirectal wound (HYDR...  Dressing Type ABD  Dressing Changed New  Dressing Status Dry  Dressing Change Frequency PRN  Site / Wound Assessment Bleeding;Brown;Red;  % Wound base Red or Granulating 40% (pink tissue near opening, unable to inspect deep. )  % Wound base Other (Comment) 70%   Peri-wound Assessment Induration;Intact  Tunneling (cm) 8cm toward center of pelvis.body  Margins Unattached edges (unapproximated)  Closure None  Drainage Amount Copious  Drainage Description Other (Comment))dark and bloody drainge on old dressing, pink/bloody  NS suctioned out with PLS.  Non-staged Wound Description Full thickness  Hydrotherapy  Pulsed lavage therapy - wound location right perirectal   Pulsed Lavage with Suction (psi) 8 psi  Pulsed Lavage with Suction - Normal Saline Used 1000 mL (only 200 due to risk for bleeding in wound.)  Pulsed  Lavage Tip Tip with splash shield (pulled back tip shield to enter into wound more)  Wound Therapy - Assess/Plan/Recommendations  Wound Therapy - Clinical Statement Pt wih perirectal abscess that had an I&D on 08/23/2014 and remain opena dn draining. Hard to visialize wound bed due to depth and location. Visible tissue is pink, Kerlix passed into wound for deep packing and pulled out, noted some blood clot and dark slough on kerlix(?BM).  Kerlix packed into wound which was very painful to patient.. No blood clot in wound today.  Wound Therapy - Functional Problem List deconditioning and unhealing wound  Factors Delaying/Impairing Wound Healing Incontinence;Multiple medical problems  Hydrotherapy Plan Debridement;Dressing change;Patient/family education;Pulsatile lavage with suction  Wound Therapy - Frequency 6X / week (no hydrotherapy on Sunday, nursing to continue with dressing)  Wound Therapy - Current Recommendations PT;Surgery consult;OT  Wound Therapy - Follow Up Recommendations Skilled nursing facility  Wound Plan will continue with PLS while pt here and needed.   Wound Therapy Goals - Improve the function of patient's integumentary system by progressing the wound(s) through the phases of wound healing by:  Decrease Necrotic Tissue to 0  Decrease Necrotic Tissue - Progress Progressing toward goal  Increase Granulation Tissue to 100  Increase Granulation Tissue - Progress Progressing toward goal  Improve Drainage Characteristics Min  Improve Drainage Characteristics - Progress Progressing toward goal  Patient/Family will be able to  recongize symptoms aof progress or not and help assist with dressing changes as needed.  Patient/Family Instruction Goal - Progress Progressing toward goal  Goals/treatment plan/discharge plan were made with and agreed upon by patient/family Yes  Time For Goal Achievement  2 weeks  Wound Therapy - Potential for Goals Alfredo Martinez PT [phone]

## 2014-08-31 NOTE — Progress Notes (Signed)
*  PRELIMINARY RESULTS* Vascular Ultrasound Right lower extremity venous duplex has been completed.  Preliminary findings: DVT noted in the right posterior tibial veins and proximal right peroneal veins.  Landry Mellow, RDMS, RVT  08/31/2014, 10:38 AM

## 2014-08-31 NOTE — Progress Notes (Signed)
Pharmacy: Remicade  Patient's a 74 y.o F with perirectal abscess and severe ulcerative colistis currently on mesalamine.  To start remicade 5 mg/kg per GI request.  Spoke to Dr. Hilarie Fredrickson, ok to to give remicade on 7/15, premed with benadryl and tylenol to prevent infusion related reactions.  Plan: - remicade 500mg  IV x1 on 7/15 - premed with benadryl 50mg  and tylenol 650 mg   Dia Sitter, PharmD, BCPS 08/31/2014 5:45 PM

## 2014-08-31 NOTE — [place] (Signed)
Patient ID: [name], female   DOB: 11-Jul-1940, 74 y.o.   MRN: [id number]     [address] [place], [place] [address]    Phone: [phone] FAX: [phone]     Subjective:  +bleeding, loose stools.    Objective:  Vital signs:  Filed Vitals:   08/30/14 1312 08/30/14 1454 08/30/14 2123 08/31/14 0408  BP: 132/68 127/81 139/71 112/56  Pulse: 89 110 91 84  Temp: 97.3 F (36.3 C) 97.5 F (36.4 C) 98.7 F (37.1 C) 98 F (36.7 C)  TempSrc: Oral Oral Oral Oral  Resp: 18 18 18 20   Height:      Weight:      SpO2: 100% 97% 91% 100%    Last BM Date: 08/30/14  Intake/Output   Yesterday:  07/13 0701 - 07/14 0700 In: 230 [P.O.:230] Out: 1150 [Urine:1150] This shift:  Total I/O In: 240 [P.O.:240] Out: -    Physical Exam: General: Pt awake/alert/oriented x4 in no acute distress Skin: wound not packed adequately.  Fibrinous exudate.  No bleeding.  Repacked.    Problem List:   Principal Problem:   Abscess, perirectal s/p I&D 08/23/2014 Active Problems:   Essential hypertension   Inflammatory bowel disease (IBD) with colitis   Sepsis   Infection due to yeast   Acute blood loss anemia   Leukocytosis   Hypokalemia   Hyponatremia   General weakness   Thrombocytopenia   Ulcerative colitis   DVT (deep venous thrombosis)   DVT (deep vein thrombosis) in pregnancy    Results:   Labs: Results for orders placed or performed during the hospital encounter of 08/22/14 (from the past 48 hour(s))  CBC     Status: Abnormal   Collection Time: 08/29/14  6:37 PM  Result Value Ref Range   WBC 13.8 (H) 4.0 - 10.5 K/uL   RBC 3.30 (L) 3.87 - 5.11 MIL/uL   Hemoglobin 10.2 (L) 12.0 - 15.0 g/dL   HCT 31.5 (L) 36.0 - 46.0 %   MCV 95.5 78.0 - 100.0 fL   MCH 30.9 26.0 - 34.0 pg   MCHC 32.4 30.0 - 36.0 g/dL   RDW 14.7 11.5 - 15.5 %   Platelets 231 150 - 400 K/uL  Basic metabolic panel     Status: Abnormal    Collection Time: 08/29/14  6:37 PM  Result Value Ref Range   Sodium 132 (L) 135 - 145 mmol/L   Potassium 3.8 3.5 - 5.1 mmol/L   Chloride 92 (L) 101 - 111 mmol/L   CO2 29 22 - 32 mmol/L   Glucose, Bld 87 65 - 99 mg/dL   BUN 6 6 - 20 mg/dL   Creatinine, Ser 0.64 0.44 - 1.00 mg/dL   Calcium 7.9 (L) 8.9 - 10.3 mg/dL   GFR calc non Af Amer >60 >60 mL/min   GFR calc Af Amer >60 >60 mL/min    Comment: (NOTE) The eGFR has been calculated using the CKD EPI equation. This calculation has not been validated in all clinical situations. eGFR's persistently <60 mL/min signify possible Chronic Kidney Disease.    Anion gap 11 5 - 15  CBC     Status: Abnormal   Collection Time: 08/30/14  5:15 AM  Result Value Ref Range   WBC 11.6 (H) 4.0 - 10.5 K/uL   RBC 3.09 (L) 3.87 - 5.11 MIL/uL   Hemoglobin 9.5 (L) 12.0 - 15.0 g/dL  HCT 29.5 (L) 36.0 - 46.0 %   MCV 95.5 78.0 - 100.0 fL   MCH 30.7 26.0 - 34.0 pg   MCHC 32.2 30.0 - 36.0 g/dL   RDW 14.9 11.5 - 15.5 %   Platelets 219 150 - 400 K/uL  Basic metabolic panel     Status: Abnormal   Collection Time: 08/30/14  5:15 AM  Result Value Ref Range   Sodium 133 (L) 135 - 145 mmol/L   Potassium 3.6 3.5 - 5.1 mmol/L   Chloride 92 (L) 101 - 111 mmol/L   CO2 30 22 - 32 mmol/L   Glucose, Bld 84 65 - 99 mg/dL   BUN 6 6 - 20 mg/dL   Creatinine, Ser 0.60 0.44 - 1.00 mg/dL   Calcium 7.6 (L) 8.9 - 10.3 mg/dL   GFR calc non Af Amer >60 >60 mL/min   GFR calc Af Amer >60 >60 mL/min    Comment: (NOTE) The eGFR has been calculated using the CKD EPI equation. This calculation has not been validated in all clinical situations. eGFR's persistently <60 mL/min signify possible Chronic Kidney Disease.    Anion gap 11 5 - 15  Protime-INR     Status: None   Collection Time: 08/30/14  8:30 AM  Result Value Ref Range   Prothrombin Time 13.5 11.6 - 15.2 seconds   INR 1.01 0.00 - 1.49  CBC     Status: Abnormal   Collection Time: 08/31/14  5:00 AM  Result Value Ref  Range   WBC 14.1 (H) 4.0 - 10.5 K/uL   RBC 2.67 (L) 3.87 - 5.11 MIL/uL   Hemoglobin 8.3 (L) 12.0 - 15.0 g/dL   HCT 25.5 (L) 36.0 - 46.0 %   MCV 95.5 78.0 - 100.0 fL   MCH 31.1 26.0 - 34.0 pg   MCHC 32.5 30.0 - 36.0 g/dL   RDW 15.2 11.5 - 15.5 %   Platelets 242 150 - 400 K/uL    Imaging / Studies: Ir Fluoro Guide Cv Line Right  08/30/2014   INDICATION: Poor venous access. In need of intravenous access for medication administration and blood draws.  Patient with complex past medical history significant for CAD, cerebrovascular disease, DM, dyslipidemia, hypertension, ulcerative colitis and recent hospitalization from 08/07/2014 through 08/21/2014 for ulcerative colitis flare who presented to [facility] with perianal pain which started the night prior to the admission. CT scan on admission demonstrated a complex perirectal infection extending up toward the retroperitoneal area. She underwent I&D of the rectal abscess on 08/22/2014 but has since had issues with persistent rectal bleeding.  Patient developed a left lower extremity pain and swelling for which a left lower extremity venous Doppler ultrasound was performed on 08/29/2014 and demonstrating a left peroneal and proximal calf DVT.  As such, request now received for IVC filter placement due to anticoagulation contraindication.  EXAM: 1. ULTRASOUND GUIDANCE FOR VASCULAR ACCESS 2. IVC CATHETERIZATION AND VENOGRAM 3. IVC FILTER INSERTION 4. ULTRASOUND FLUOROSCOPIC GUIDED RIGHT BRACHIAL VEIN PICC LINE PLACEMENT  COMPARISON:  CT the abdomen and pelvis - 08/22/2014  MEDICATIONS: Fentanyl 50 mcg IV; Versed 2 mg IV  ANESTHESIA/SEDATION: Sedation Time  32 minutes  CONTRAST:  None. The inferior vena cavagram was performed with CO2 given history of contrast allergy  FLUOROSCOPY TIME:  1 minute (660 mGy)  COMPLICATIONS: None immediate  PROCEDURE: Informed consent was obtained from the patient following explanation of the procedure, risks, benefits and  alternatives for both IVC filter placement as well  as PICC line placement. The patient understands, agrees and consents for both procedures. All questions were addressed. A time out was performed prior to the initiation of the procedure.  The right upper extremity was prepped with chlorhexidine in a sterile fashion, and a sterile drape was applied covering the operative field. Maximum barrier sterile technique with sterile gowns and gloves were used for the procedure. A timeout was performed prior to the initiation of the procedure. Local anesthesia was provided with 1% lidocaine.  Under direct ultrasound guidance, the right brachial vein was accessed with a micropuncture kit after the overlying soft tissues were anesthetized with 1% lidocaine. Incidental note was made of a proximal bifurcation of the brachial artery below the level of the elbow joint. An ultrasound image was saved for documentation purposes. A guidewire was advanced to the level of the superior caval-atrial junction for measurement purposes and the PICC line was cut to length. A peel-away sheath was placed and a 39 cm, 5 [place], dual lumen was inserted to level of the superior caval-atrial junction. A post procedure spot fluoroscopic was obtained. The catheter easily aspirated and flushed and was sutured in place. A dressing was placed. The patient tolerated the procedure well without immediate post procedural complication.  Attention was now paid towards placement of the IVC filter.  Again, maximal barrier sterile technique utilized including caps, mask, sterile gowns, sterile gloves, large sterile drape, hand hygiene, and Betadine prep.  Under sterile condition and local anesthesia, right internal jugular venous access was performed with ultrasound. An ultrasound image was saved and sent to PACS. Over a guidewire, the IVC filter delivery sheath and inner dilator were advanced into the IVC just above the IVC bifurcation. CO2 injection was  performed for an IVC venogram.  Through the delivery sheath, a retrievable Denali IVC filter was deployed below the level of the renal veins and above the IVC bifurcation. Limited post deployment venacavagram was performed.  The delivery sheath was removed and hemostasis was obtained with manual compression. A dressing was placed.  The patient tolerated the above procedures well without immediate post procedural complication.  FINDINGS: After PICC line placement, the tip terminates within the superior cavoatrial junction.  The IVC is patent. No evidence of thrombus, stenosis, or occlusion. No variant venous anatomy. Successful placement of the IVC filter below the level of the renal veins.  IMPRESSION: 1. Successful placement of a right brachial vein approach dual lumen PICC line with tip terminating within the superior cavoatrial junction. The PICC line is ready for immediate use. 2. Successful ultrasound and fluoroscopically guided placement of an infrarenal retrievable IVC filter via right jugular approach.  PLAN: Due to patient related comorbidities and/or clinical necessity, this IVC filter should be considered a permanent device. This patient will not be actively followed for future filter retrieval.   Electronically Signed   By: Sandi Mariscal M.D.   On: 08/30/2014 14:14   Ir US Guide Vasc Access Right  08/30/2014   INDICATION: Poor venous access. In need of intravenous access for medication administration and blood draws.  Patient with complex past medical history significant for CAD, cerebrovascular disease, DM, dyslipidemia, hypertension, ulcerative colitis and recent hospitalization from 08/07/2014 through 08/21/2014 for ulcerative colitis flare who presented to [facility] with perianal pain which started the night prior to the admission. CT scan on admission demonstrated a complex perirectal infection extending up toward the retroperitoneal area. She underwent I&D of the rectal abscess on  08/22/2014 but has since had  issues with persistent rectal bleeding.  Patient developed a left lower extremity pain and swelling for which a left lower extremity venous Doppler ultrasound was performed on 08/29/2014 and demonstrating a left peroneal and proximal calf DVT.  As such, request now received for IVC filter placement due to anticoagulation contraindication.  EXAM: 1. ULTRASOUND GUIDANCE FOR VASCULAR ACCESS 2. IVC CATHETERIZATION AND VENOGRAM 3. IVC FILTER INSERTION 4. ULTRASOUND FLUOROSCOPIC GUIDED RIGHT BRACHIAL VEIN PICC LINE PLACEMENT  COMPARISON:  CT the abdomen and pelvis - 08/22/2014  MEDICATIONS: Fentanyl 50 mcg IV; Versed 2 mg IV  ANESTHESIA/SEDATION: Sedation Time  32 minutes  CONTRAST:  None. The inferior vena cavagram was performed with CO2 given history of contrast allergy  FLUOROSCOPY TIME:  1 minute (347 mGy)  COMPLICATIONS: None immediate  PROCEDURE: Informed consent was obtained from the patient following explanation of the procedure, risks, benefits and alternatives for both IVC filter placement as well as PICC line placement. The patient understands, agrees and consents for both procedures. All questions were addressed. A time out was performed prior to the initiation of the procedure.  The right upper extremity was prepped with chlorhexidine in a sterile fashion, and a sterile drape was applied covering the operative field. Maximum barrier sterile technique with sterile gowns and gloves were used for the procedure. A timeout was performed prior to the initiation of the procedure. Local anesthesia was provided with 1% lidocaine.  Under direct ultrasound guidance, the right brachial vein was accessed with a micropuncture kit after the overlying soft tissues were anesthetized with 1% lidocaine. Incidental note was made of a proximal bifurcation of the brachial artery below the level of the elbow joint. An ultrasound image was saved for documentation purposes. A guidewire was advanced to the  level of the superior caval-atrial junction for measurement purposes and the PICC line was cut to length. A peel-away sheath was placed and a 39 cm, 5 [place], dual lumen was inserted to level of the superior caval-atrial junction. A post procedure spot fluoroscopic was obtained. The catheter easily aspirated and flushed and was sutured in place. A dressing was placed. The patient tolerated the procedure well without immediate post procedural complication.  Attention was now paid towards placement of the IVC filter.  Again, maximal barrier sterile technique utilized including caps, mask, sterile gowns, sterile gloves, large sterile drape, hand hygiene, and Betadine prep.  Under sterile condition and local anesthesia, right internal jugular venous access was performed with ultrasound. An ultrasound image was saved and sent to PACS. Over a guidewire, the IVC filter delivery sheath and inner dilator were advanced into the IVC just above the IVC bifurcation. CO2 injection was performed for an IVC venogram.  Through the delivery sheath, a retrievable Denali IVC filter was deployed below the level of the renal veins and above the IVC bifurcation. Limited post deployment venacavagram was performed.  The delivery sheath was removed and hemostasis was obtained with manual compression. A dressing was placed.  The patient tolerated the above procedures well without immediate post procedural complication.  FINDINGS: After PICC line placement, the tip terminates within the superior cavoatrial junction.  The IVC is patent. No evidence of thrombus, stenosis, or occlusion. No variant venous anatomy. Successful placement of the IVC filter below the level of the renal veins.  IMPRESSION: 1. Successful placement of a right brachial vein approach dual lumen PICC line with tip terminating within the superior cavoatrial junction. The PICC line is ready for immediate use. 2. Successful ultrasound and fluoroscopically  guided placement of an  infrarenal retrievable IVC filter via right jugular approach.  PLAN: Due to patient related comorbidities and/or clinical necessity, this IVC filter should be considered a permanent device. This patient will not be actively followed for future filter retrieval.   Electronically Signed   By: Sandi Mariscal M.D.   On: 08/30/2014 14:14   Ir Radiologist Eval & Mgmt  08/30/2014   INDICATION: Poor venous access. In need of intravenous access for medication administration and blood draws.  Patient with complex past medical history significant for CAD, cerebrovascular disease, DM, dyslipidemia, hypertension, ulcerative colitis and recent hospitalization from 08/07/2014 through 08/21/2014 for ulcerative colitis flare who presented to [facility] with perianal pain which started the night prior to the admission. CT scan on admission demonstrated a complex perirectal infection extending up toward the retroperitoneal area. She underwent I&D of the rectal abscess on 08/22/2014 but has since had issues with persistent rectal bleeding.  Patient developed a left lower extremity pain and swelling for which a left lower extremity venous Doppler ultrasound was performed on 08/29/2014 and demonstrating a left peroneal and proximal calf DVT.  As such, request now received for IVC filter placement due to anticoagulation contraindication.  EXAM: 1. ULTRASOUND GUIDANCE FOR VASCULAR ACCESS 2. IVC CATHETERIZATION AND VENOGRAM 3. IVC FILTER INSERTION 4. ULTRASOUND FLUOROSCOPIC GUIDED RIGHT BRACHIAL VEIN PICC LINE PLACEMENT  COMPARISON:  CT the abdomen and pelvis - 08/22/2014  MEDICATIONS: Fentanyl 50 mcg IV; Versed 2 mg IV  ANESTHESIA/SEDATION: Sedation Time  32 minutes  CONTRAST:  None. The inferior vena cavagram was performed with CO2 given history of contrast allergy  FLUOROSCOPY TIME:  1 minute (536 mGy)  COMPLICATIONS: None immediate  PROCEDURE: Informed consent was obtained from the patient following explanation of the procedure,  risks, benefits and alternatives for both IVC filter placement as well as PICC line placement. The patient understands, agrees and consents for both procedures. All questions were addressed. A time out was performed prior to the initiation of the procedure.  The right upper extremity was prepped with chlorhexidine in a sterile fashion, and a sterile drape was applied covering the operative field. Maximum barrier sterile technique with sterile gowns and gloves were used for the procedure. A timeout was performed prior to the initiation of the procedure. Local anesthesia was provided with 1% lidocaine.  Under direct ultrasound guidance, the right brachial vein was accessed with a micropuncture kit after the overlying soft tissues were anesthetized with 1% lidocaine. Incidental note was made of a proximal bifurcation of the brachial artery below the level of the elbow joint. An ultrasound image was saved for documentation purposes. A guidewire was advanced to the level of the superior caval-atrial junction for measurement purposes and the PICC line was cut to length. A peel-away sheath was placed and a 39 cm, 5 [place], dual lumen was inserted to level of the superior caval-atrial junction. A post procedure spot fluoroscopic was obtained. The catheter easily aspirated and flushed and was sutured in place. A dressing was placed. The patient tolerated the procedure well without immediate post procedural complication.  Attention was now paid towards placement of the IVC filter.  Again, maximal barrier sterile technique utilized including caps, mask, sterile gowns, sterile gloves, large sterile drape, hand hygiene, and Betadine prep.  Under sterile condition and local anesthesia, right internal jugular venous access was performed with ultrasound. An ultrasound image was saved and sent to PACS. Over a guidewire, the IVC filter delivery sheath and inner  dilator were advanced into the IVC just above the IVC bifurcation. CO2  injection was performed for an IVC venogram.  Through the delivery sheath, a retrievable Denali IVC filter was deployed below the level of the renal veins and above the IVC bifurcation. Limited post deployment venacavagram was performed.  The delivery sheath was removed and hemostasis was obtained with manual compression. A dressing was placed.  The patient tolerated the above procedures well without immediate post procedural complication.  FINDINGS: After PICC line placement, the tip terminates within the superior cavoatrial junction.  The IVC is patent. No evidence of thrombus, stenosis, or occlusion. No variant venous anatomy. Successful placement of the IVC filter below the level of the renal veins.  IMPRESSION: 1. Successful placement of a right brachial vein approach dual lumen PICC line with tip terminating within the superior cavoatrial junction. The PICC line is ready for immediate use. 2. Successful ultrasound and fluoroscopically guided placement of an infrarenal retrievable IVC filter via right jugular approach.  PLAN: Due to patient related comorbidities and/or clinical necessity, this IVC filter should be considered a permanent device. This patient will not be actively followed for future filter retrieval.   Electronically Signed   By: Sandi Mariscal M.D.   On: 08/30/2014 14:14    Medications / Allergies:  Scheduled Meds: . acetaminophen  1,000 mg Oral TID  . fluconazole  400 mg Oral Daily  . hydrALAZINE  10 mg Oral 3 times per day  . lip balm  1 application Topical BID  . mesalamine  4.8 g Oral Q breakfast  . piperacillin-tazobactam (ZOSYN)  IV  3.375 g Intravenous 3 times per day  . predniSONE  20 mg Oral QAC breakfast  . psyllium  1 packet Oral BID  . saccharomyces boulardii  250 mg Oral BID  . sodium chloride  10-40 mL Intracatheter Q12H  . sodium chloride  3 mL Intravenous Q12H  . sodium chloride  3 mL Intravenous Q12H   Continuous Infusions:  PRN Meds:.sodium chloride, alum & mag  hydroxide-simeth, bismuth subsalicylate, loperamide, magic mouthwash, menthol-cetylpyridinium, morphine injection, ondansetron, ondansetron **OR** ondansetron (ZOFRAN) IV, oxyCODONE, phenol, sodium chloride, sodium chloride  Antibiotics: Anti-infectives    Start     Dose/Rate Route Frequency Ordered Stop   08/28/14 0000  fluconazole (DIFLUCAN) 200 MG tablet     400 mg Oral Daily 08/28/14 1052     08/28/14 0000  amoxicillin-clavulanate (AUGMENTIN) 875-125 MG per tablet     1 tablet Oral 2 times daily 08/28/14 1052     08/25/14 2000  vancomycin (VANCOCIN) IVPB 1000 mg/200 mL premix  Status:  Discontinued     1,000 mg 200 mL/hr over 60 Minutes Intravenous Every 12 hours 08/25/14 1009 08/26/14 0949   08/25/14 1200  fluconazole (DIFLUCAN) tablet 400 mg     400 mg Oral Daily 08/25/14 0943     08/23/14 1000  vancomycin (VANCOCIN) IVPB 750 mg/150 ml premix  Status:  Discontinued     750 mg 150 mL/hr over 60 Minutes Intravenous Every 12 hours 08/22/14 2127 08/25/14 1009   08/23/14 0600  piperacillin-tazobactam (ZOSYN) IVPB 3.375 g     3.375 g 12.5 mL/hr over 240 Minutes Intravenous 3 times per day 08/22/14 2124     08/22/14 2130  piperacillin-tazobactam (ZOSYN) IVPB 3.375 g     3.375 g 100 mL/hr over 30 Minutes Intravenous NOW 08/22/14 2124 08/22/14 2220   08/22/14 2130  [MAR Hold]  vancomycin (VANCOCIN) 2,000 mg in sodium chloride 0.9 % 500  mL IVPB     (MAR Hold since 08/22/14 2338)   2,000 mg 250 mL/hr over 120 Minutes Intravenous NOW 08/22/14 2127 08/23/14 0008        Assessment/Plan Ulcerative colitis POD#9 I&D perirectal abscess---Dr. Zella Richer -continue with hydro and BID wet to dry dressing changes and PRN when soiled. -Bulk up stool--seems better, but still has bleeding.  -mobilize, turning to avoid further skin breakdown. -GI following, plan to start Remicade.  Now on steroids and mesalamine.  ID-per ID and primary team. New DVT s/p IVC filter  Erby Pian, ANP-BC [facility] Pager [phone](7A-4:30P)   08/31/2014 10:31 AM

## 2014-08-31 NOTE — Progress Notes (Addendum)
ANTIBIOTIC CONSULT NOTE - Follow Up  Pharmacy Consult for Zosyn Indication: Peri-rectal abscess  Allergies  Allergen Reactions  . Clindamycin/Lincomycin Diarrhea  . Ivp Dye [Iodinated Diagnostic Agents] Other (See Comments)    "almost passed out"    Patient Measurements: Height: 5\' 9"  (175.3 cm) Weight: 229 lb 4.5 oz (104 kg) IBW/kg (Calculated) : 66.2  Vital Signs: Temp: 98 F (36.7 C) (07/14 0408) Temp Source: Oral (07/14 0408) BP: 112/56 mmHg (07/14 0408) Pulse Rate: 84 (07/14 0408) Intake/Output from previous day: 07/13 0701 - 07/14 0700 In: 230 [P.O.:230] Out: 1150 [Urine:1150] Intake/Output from this shift: Total I/O In: 240 [P.O.:240] Out: -   Labs:  Recent Labs  08/29/14 1837 08/30/14 0515 08/31/14 0500  WBC 13.8* 11.6* 14.1*  HGB 10.2* 9.5* 8.3*  PLT 231 219 242  CREATININE 0.64 0.60  --    Estimated Creatinine Clearance: 76.8 mL/min (by C-G formula based on Cr of 0.6). No results for input(s): VANCOTROUGH, VANCOPEAK, VANCORANDOM, GENTTROUGH, GENTPEAK, GENTRANDOM, TOBRATROUGH, TOBRAPEAK, TOBRARND, AMIKACINPEAK, AMIKACINTROU, AMIKACIN in the last 72 hours.   Microbiology: Recent Results (from the past 720 hour(s))  Clostridium Difficile by PCR (not at [facility])     Status: None   Collection Time: 08/03/14  4:15 PM  Result Value Ref Range Status   C difficile by pcr Not Detected Not Detected Final    Comment: This test is for use only with liquid or soft stools; performance characteristics of other clinical specimen types have not been established.   This assay was performed by Cepheid GeneXpert(R) PCR. The performance characteristics of this assay have been determined by [employer]. Performance characteristics refer to the analytical performance of the test.   Clostridium Difficile by PCR (not at [facility])     Status: None   Collection Time: 08/10/14  1:11 PM  Result Value Ref Range Status   C difficile by pcr NEGATIVE NEGATIVE Final   Culture, blood (routine x 2)     Status: None   Collection Time: 08/22/14  9:30 PM  Result Value Ref Range Status   Specimen Description BLOOD LEFT HAND  Final   Special Requests BOTTLES DRAWN AEROBIC ONLY 5CC  Final   Culture   Final    NO GROWTH 5 DAYS Performed at [facility]    Report Status 08/27/2014 FINAL  Final  Anaerobic culture     Status: None   Collection Time: 08/23/14 12:03 AM  Result Value Ref Range Status   Specimen Description ABSCESS PERIRECTAL  Final   Special Requests PATIENT ON FOLLOWING ZOYSN, VANC  Final   Gram Stain   Final    NO WBC SEEN NO SQUAMOUS EPITHELIAL CELLS SEEN MODERATE GRAM NEGATIVE RODS FEW YEAST RARE GRAM POSITIVE COCCI    Culture   Final    NO ANAEROBES ISOLATED Performed at [facility]    Report Status 08/27/2014 FINAL  Final  Culture, routine-abscess     Status: None   Collection Time: 08/23/14 12:03 AM  Result Value Ref Range Status   Specimen Description ABSCESS PERIRECTAL  Final   Special Requests PATIENT ON FOLLOWING ZOYSN, VANC  Final   Gram Stain   Final    NO WBC SEEN NO SQUAMOUS EPITHELIAL CELLS SEEN MODERATE GRAM NEGATIVE RODS RARE YEAST RARE GRAM POSITIVE COCCI    Culture   Final    MULTIPLE ORGANISMS PRESENT, NONE PREDOMINANT Note: NO STAPHYLOCOCCUS AUREUS ISOLATED NO GROUP A STREP (S.PYOGENES) ISOLATED Performed at [facility]  Report Status 08/26/2014 FINAL  Final  MRSA PCR Screening     Status: None   Collection Time: 08/23/14  1:32 AM  Result Value Ref Range Status   MRSA by PCR NEGATIVE NEGATIVE Final    Comment:        The GeneXpert MRSA Assay (FDA approved for NASAL specimens only), is one component of a comprehensive MRSA colonization surveillance program. It is not intended to diagnose MRSA infection nor to guide or monitor treatment for MRSA infections.   Culture, blood (routine x 2)     Status: None   Collection Time: 08/23/14  4:55 AM  Result Value Ref Range  Status   Specimen Description BLOOD LEFT HAND  Final   Special Requests BOTTLES DRAWN AEROBIC ONLY 5ML  Final   Culture   Final    NO GROWTH 5 DAYS Performed at [facility]    Report Status 08/28/2014 FINAL  Final  Clostridium Difficile by PCR (not at [facility])     Status: None   Collection Time: 08/28/14  7:11 PM  Result Value Ref Range Status   C difficile by pcr NEGATIVE NEGATIVE Final    Medical History: Past Medical History  Diagnosis Date  . CAD (coronary artery disease)     unspecified  . Cerebrovascular disease   . Hyperlipidemia, mixed   . HTN (hypertension)   . Ulcerative colitis   . Diverticulitis   . Colon polyps 2006  . Thyroid disease   . Myocardial infarct   . Carotid artery occlusion 2010  . Diabetes mellitus without complication     Borderline  . GERD (gastroesophageal reflux disease)   . Arthritis     Assessment: 42 yoF with hospitalization 6/20-7/4 for ulcerative colitis flare presents one day following discharge with rectal pain and abdominal bleeding . PMH significant for CAD s/p CABG, CEA.  CT abd/pelvis shows extensive soft tissue gas within the right gluteal fat extending into the gluteal musculature and throughout retroperitoneum.  Findings concerning for aggressive infectious process in setting of retroperitoneal fasciitis. Pharmacy consulted to start vancomycin and zosyn.  Patient is now s/p I&D on 7/5 by surgery.  Fluconazole added per surgery on 7/8 due to presence of yeast in abscess culture. Vancomycin d/c'ed 7/9 per abscess culture data.  Temp: remains afebrile WBC: moderately elevated, previously wnl Renal: SCr stable wnl; CrCl 77 CG  7/5 >> Vancomycin  >> 7/9 7/5 >> Zosyn >>   7/8 >> Diflucan 400mg  PO daily >>  7/5 blood: NGF 7/6 blood: NGF 7/6 abscess:  multiple organisms present, none predominant. Rare yeast. No Staph aureus or group A strep isolated. No anaerobes isolated. 7/6 MRSA PCR: negative 7/11 C.diff PCR:  negative  Goal of Therapy:  Doses adjusted per renal function Eradication of infection  Plan:   Continue Zosyn 3.375g IV q8h (4 hour infusion time)  Monitor renal function, cultures, clinical course, LOT.  Per ID recs, pt to complete 14 days of abx, transition to Augmentin + Fluconazole at discharge  With stable renal function, pharmacy will sign off of formal note-writing and follow clinical course peripherally.   Reuel Boom, PharmD, BCPS Pager: [phone] 08/31/2014, 11:05 AM

## 2014-09-01 ENCOUNTER — Ambulatory Visit: Payer: BC Managed Care – PPO | Admitting: Physician Assistant

## 2014-09-01 LAB — CBC
HCT: 24.4 % — ABNORMAL LOW (ref 36.0–46.0)
Hemoglobin: 7.9 g/dL — ABNORMAL LOW (ref 12.0–15.0)
MCH: 31 pg (ref 26.0–34.0)
MCHC: 32.4 g/dL (ref 30.0–36.0)
MCV: 95.7 fL (ref 78.0–100.0)
Platelets: 247 10*3/uL (ref 150–400)
RBC: 2.55 MIL/uL — AB (ref 3.87–5.11)
RDW: 15.4 % (ref 11.5–15.5)
WBC: 13.1 10*3/uL — ABNORMAL HIGH (ref 4.0–10.5)

## 2014-09-01 MED ORDER — OXYCODONE HCL 5 MG PO TABS
5.0000 mg | ORAL_TABLET | ORAL | Status: DC | PRN
  Administered 2014-09-16 – 2014-09-18 (×8): 10 mg via ORAL
  Administered 2014-09-20 – 2014-09-21 (×4): 15 mg via ORAL
  Filled 2014-09-01: qty 3
  Filled 2014-09-01 (×2): qty 2
  Filled 2014-09-01: qty 3
  Filled 2014-09-01 (×5): qty 2
  Filled 2014-09-01: qty 3
  Filled 2014-09-01: qty 2
  Filled 2014-09-01: qty 3

## 2014-09-01 NOTE — Progress Notes (Signed)
[facility] Progress Note   [name] LJQ:[id number] DOB: [birth year] DOA: 08/22/2014 PCP: Kandice Hams, MD  Brief narrative: 74 y.o. female with past medical history of dyslipidemia, hypertension, long standing ulcerative colitis and recently hospitalized from 08/07/2014 through 08/21/2014 for ulcerative colitis flare. She had flexible sigmoidoscopy 08/08/2014 which demonstrated inflammatory changes from the rectum consistent with ulcerative colitis. She presented to [facility] with perianal pain started the night prior to the admission. CT scan on the admission demonstrated a complex perirectal infection extending up toward the retroperitoneal area.  08/21/13 underwent incision and drainage of the large issue rectal abscess on the right side. She was started on vancomycin and Zosyn. Postprocedure she was doing fine but has developed large amount of bleed on the morning of 08/23/2014. GI was consulted and pt was started on prednisone and mesalamine.  Hospital course is complicated with ongoing rectal bleed so  Additionally, she was found to have left LE DVT On venous doppler done 7/12.   Subjective: Having lower abdominal pain. Incontinent of stool. No nausea or vomiting.   Assessment/Plan: Sepsis secondary to peri-rectal abscess / leukocytosis  - Sepsis criteria met on the admission with tachycardia, tachypnea, hypotension, leukocytosis. Source of infection is perirectal abscess. - S/P incision and drainage by surgery 08/23/2014 - Started on vancomycin and Zosyn at the time of the admission. Abscess culture demonstrated rare yeast and multiple other organisms/ none predominant - Blood cultures to date are negative.  - Vancomycin was stopped 08/26/2014. Currently on Zosyn and fluconazole. -Dr. Megan Salon of infectious disease who recommended total duration of antibiotics - 14 days. Once she is stable for discharge she can be switched to Augmentin and PO fluconazole.  Bloody  diarrhea due to UC  - Patient had a rectal bleed after incision and drainage done 08/23/2014 which has continued and is soiling the wound from rectal abscess -GI consulted and pt started on prednisone and mesalamine which has not been effective - started on [employer] today  Acute blood loss anemia  - Hb 7.9 today- will cont to follow Hb and transfuse if lower   Essential hypertension - SBP was elevated in 150s - Added hydralazine for better BP control which is helping    Left lower extremity DVT - Noted on LLE doppler - Not a candidate for anticoagulation due to ongoing active rectal bleed - status post IVC filter placement  Right lower extremity DVT 7/14- Venous Doppler of the right lower extremity, the right was performed- right posterior tibial veins and proximal right peroneal veins. Patient already has IVC filter in place, not a candidate for anticoagulation due to ongoing lower GI bleed.   Hyponatremia - Likely related to acute infection, sepsis - Sodium stable at 131 - 132   Hypokalemia - Likely from acute infection, GI losses - Supplemented  - Last potassium 3.6   Generalized weakness  - Patient will be discharged to skilled nursing facility once medically stable.   Code Status: full code Family Communication:  Disposition Plan:  DVT prophylaxis: SCDs-  Consultants:  Gastroenterology  Surgery  Interventional radiology  Procedures:  IVC filter present  PICC line Antibiotics: Anti-infectives    Start     Dose/Rate Route Frequency Ordered Stop   08/28/14 0000  fluconazole (DIFLUCAN) 200 MG tablet     400 mg Oral Daily 08/28/14 1052     08/28/14 0000  amoxicillin-clavulanate (AUGMENTIN) 875-125 MG per tablet     1 tablet Oral 2 times daily 08/28/14 1052  08/25/14 2000  vancomycin (VANCOCIN) IVPB 1000 mg/200 mL premix  Status:  Discontinued     1,000 mg 200 mL/hr over 60 Minutes Intravenous Every 12 hours 08/25/14 1009 08/26/14 0949   08/25/14  1200  fluconazole (DIFLUCAN) tablet 400 mg     400 mg Oral Daily 08/25/14 0943     08/23/14 1000  vancomycin (VANCOCIN) IVPB 750 mg/150 ml premix  Status:  Discontinued     750 mg 150 mL/hr over 60 Minutes Intravenous Every 12 hours 08/22/14 2127 08/25/14 1009   08/23/14 0600  piperacillin-tazobactam (ZOSYN) IVPB 3.375 g     3.375 g 12.5 mL/hr over 240 Minutes Intravenous 3 times per day 08/22/14 2124     08/22/14 2130  piperacillin-tazobactam (ZOSYN) IVPB 3.375 g     3.375 g 100 mL/hr over 30 Minutes Intravenous NOW 08/22/14 2124 08/22/14 2220   08/22/14 2130  [MAR Hold]  vancomycin (VANCOCIN) 2,000 mg in sodium chloride 0.9 % 500 mL IVPB     (MAR Hold since 08/22/14 2338)   2,000 mg 250 mL/hr over 120 Minutes Intravenous NOW 08/22/14 2127 08/23/14 0008      Objective: Filed Weights   08/22/14 2113 08/23/14 0153 08/23/14 0434  Weight: 99.791 kg (220 lb) 102.3 kg (225 lb 8.5 oz) 104 kg (229 lb 4.5 oz)    Intake/Output Summary (Last 24 hours) at 09/01/14 1513 Last data filed at 09/01/14 0630  Gross per 24 hour  Intake    220 ml  Output    750 ml  Net   -530 ml     Vitals Filed Vitals:   08/31/14 1626 08/31/14 2145 09/01/14 0643 09/01/14 1454  BP: 135/67 118/62 132/61 134/64  Pulse: 81 81 98 94  Temp: 97.6 F (36.4 C) 97.6 F (36.4 C) 98 F (36.7 C) 98.9 F (37.2 C)  TempSrc: Oral Oral Oral Oral  Resp: _0 Height:      Weight:      SpO2: 94% 100% 100% 95%    Exam:  General:  Pt is alert, not in acute distress  HEENT: No icterus, No thrush, oral mucosa moist  Cardiovascular: regular rate and rhythm, S1/S2 No murmur  Respiratory: clear to auscultation bilaterally   Abdomen: Soft, +Bowel sounds,  Tender in suprapubic area, non distended, no guarding  MSK: No LE edema, cyanosis or clubbing  Data Reviewed: Basic Metabolic Panel:  Recent Labs Lab 08/26/14 0813 08/27/14 0756 08/28/14 0440 08/29/14 1837 08/30/14 0515  NA 131* 132* 131* 132* 133*   K 3.1* 3.2* 3.3* 3.8 3.6  CL 96* 96* 93* 92* 92*  CO2 _1 GLUCOSE 74 95 77 87 84  BUN <5* <5* <5* 6 6  CREATININE 0.71 0.64 0.61 0.64 0.60  CALCIUM 7.2* 7.3* 7.4* 7.9* 7.6*   Liver Function Tests: No results for input(s): AST, ALT, ALKPHOS, BILITOT, PROT, ALBUMIN in the last 168 hours. No results for input(s): LIPASE, AMYLASE in the last 168 hours. No results for input(s): AMMONIA in the last 168 hours. CBC:  Recent Labs Lab 08/28/14 0440 08/29/14 1837 08/30/14 0515 08/31/14 0500 09/01/14 0525  WBC 9.6 13.8* 11.6* 14.1* 13.1*  HGB 9.7* 10.2* 9.5* 8.3* 7.9*  HCT 30.4* 31.5* 29.5* 25.5* 24.4*  MCV 97.1 95.5 95.5 95.5 95.7  PLT 158 231 219 242 247   Cardiac Enzymes: No results for input(s): CKTOTAL, CKMB, CKMBINDEX, TROPONINI in the last 168 hours. BNP (last 3 results) No results for input(s): BNP  in the last 8760 hours.  ProBNP (last 3 results) No results for input(s): PROBNP in the last 8760 hours.  CBG:  Recent Labs Lab 08/28/14 0811 08/28/14 0843 08/28/14 0903 08/28/14 0951 08/31/14 2143  GLUCAP 60* 56* 66 88 123*    Recent Results (from the past 240 hour(s))  Culture, blood (routine x 2)     Status: None   Collection Time: 08/22/14  9:30 PM  Result Value Ref Range Status   Specimen Description BLOOD LEFT HAND  Final   Special Requests BOTTLES DRAWN AEROBIC ONLY 5CC  Final   Culture   Final    NO GROWTH 5 DAYS Performed at [facility]    Report Status 08/27/2014 FINAL  Final  Anaerobic culture     Status: None   Collection Time: 08/23/14 12:03 AM  Result Value Ref Range Status   Specimen Description ABSCESS PERIRECTAL  Final   Special Requests PATIENT ON FOLLOWING ZOYSN, VANC  Final   Gram Stain   Final    NO WBC SEEN NO SQUAMOUS EPITHELIAL CELLS SEEN MODERATE GRAM NEGATIVE RODS FEW YEAST RARE GRAM POSITIVE COCCI    Culture   Final    NO ANAEROBES ISOLATED Performed at [facility]    Report Status 08/27/2014  FINAL  Final  Culture, routine-abscess     Status: None   Collection Time: 08/23/14 12:03 AM  Result Value Ref Range Status   Specimen Description ABSCESS PERIRECTAL  Final   Special Requests PATIENT ON FOLLOWING ZOYSN, VANC  Final   Gram Stain   Final    NO WBC SEEN NO SQUAMOUS EPITHELIAL CELLS SEEN MODERATE GRAM NEGATIVE RODS RARE YEAST RARE GRAM POSITIVE COCCI    Culture   Final    MULTIPLE ORGANISMS PRESENT, NONE PREDOMINANT Note: NO STAPHYLOCOCCUS AUREUS ISOLATED NO GROUP A STREP (S.PYOGENES) ISOLATED Performed at [facility]    Report Status 08/26/2014 FINAL  Final  MRSA PCR Screening     Status: None   Collection Time: 08/23/14  1:32 AM  Result Value Ref Range Status   MRSA by PCR NEGATIVE NEGATIVE Final    Comment:        The GeneXpert MRSA Assay (FDA approved for NASAL specimens only), is one component of a comprehensive MRSA colonization surveillance program. It is not intended to diagnose MRSA infection nor to guide or monitor treatment for MRSA infections.   Culture, blood (routine x 2)     Status: None   Collection Time: 08/23/14  4:55 AM  Result Value Ref Range Status   Specimen Description BLOOD LEFT HAND  Final   Special Requests BOTTLES DRAWN AEROBIC ONLY 5ML  Final   Culture   Final    NO GROWTH 5 DAYS Performed at [facility]    Report Status 08/28/2014 FINAL  Final  Clostridium Difficile by PCR (not at [facility])     Status: None   Collection Time: 08/28/14  7:11 PM  Result Value Ref Range Status   C difficile by pcr NEGATIVE NEGATIVE Final     Studies: No results found.  Scheduled Meds:  Scheduled Meds: . acetaminophen  1,000 mg Oral TID  . acetaminophen  650 mg Oral Once  . colestipol  3 g Oral Q12H  . fluconazole  400 mg Oral Daily  . hydrALAZINE  10 mg Oral 3 times per day  . lip balm  1 application Topical BID  . mesalamine  4.8 g Oral Q breakfast  . nystatin  Topical BID  . piperacillin-tazobactam (ZOSYN)  IV   3.375 g Intravenous 3 times per day  . psyllium  1 packet Oral BID  . saccharomyces boulardii  250 mg Oral BID  . sodium chloride  10-40 mL Intracatheter Q12H  . sodium chloride  3 mL Intravenous Q12H  . sodium chloride  3 mL Intravenous Q12H   Continuous Infusions:   Time spent on care of this patient: 35 min   [place], MD 09/01/2014, 3:13 PM  LOS: 10 days   [facility] Office  [phone] Pager - Text Page per [email or web address] If 7PM-7AM, please contact night-coverage [email or web address]

## 2014-09-01 NOTE — Progress Notes (Addendum)
09/01/14 1640 Hydrotherapy treatment  Subjective Assessment  Subjective Oh it hurts so much. do what you have to do.  Patient and Family Stated Goals I [place] this year.   Date of Onset 08/27/14  Prior Treatments Pt in with flare up of ulverative colitis with paina n diarrhea,a nd pain in perirectal area. I &D on 08/23/2014 for drainage adn opening of perirectal abscess. Penrose drains were in place earlier this week, but have been removed by surgery and hydrotherapy consult put in place.   Evaluation and Treatment  Evaluation and Treatment Procedures Explained to Patient/Family Yes  Evaluation and Treatment Procedures agreed to  [REMOVED] Wound / Incision (Open or Dehisced) 08/27/14 Other (Comment) Rectum Right Right Perirectal wound (HYDRO)  Final Assessment Date/Final Assessment Time: 08/27/14 1200  Date First Assessed/Time First Assessed: 08/27/14 1100   Wound Type: Other (Comment)  Location: Rectum  Location Orientation: Right  Wound Description (Comments): Right Perirectal wound (HYDR...  Dressing Type Gauze (Comment);Moist to dry (4- 4x4's hypofix)  Dressing Status Dry  Dressing Change Frequency PRN  Site / Wound Assessment Brown;Painful;Pale  % Wound base Red or Granulating 40% (pink tissue near opening, unable to inspect deep. old dressh)  % Wound base Other (Comment) 40%  Peri-wound Assessment Other (Comment) (appearance of  BM in wound.)  Tunneling (cm) 8 cm tunneling inward toward  front of body, tunnel/cavity at 3-5 o'clock  Margins Unattached edges (unapproximated)  Closure None  Drainage Amount Copious  Drainage Description (dark drainage? BM)  Non-staged Wound Description Full thickness  Hydrotherapy  Pulsed lavage therapy - wound location right perirectal   Pulsed Lavage with Suction (psi) 4 psi  Pulsed Lavage with Suction - Normal Saline Used (200)  Pulsed Lavage Tip Tip with splash  shield (pulled back tip shield to enter wound but painful)  Wound Therapy - Assess/Plan/Recommendations  Wound Therapy - Clinical Statement Pt wih perirectal abscess that had an I&D on 08/23/2014 and remain open and draining. Hard to visualize wound bed due to depth and location. Visible tissue is not as pink as previous.  Upon arrival, old dressing not  In/over wound. BM present in  and around wound. Kerlix packed into wound which was very painful to patient.. . PLS used more for washout, unable to inspect deeper wound at base. very painful. Will Donalds, PA in to inspect wound. says continue to wash out wound, will ask nursing to irrigate at  each BM.  Wound Therapy - Functional Problem List deconditioning and unhealing wound  Factors Delaying/Impairing Wound Healing Incontinence;Multiple medical problems  Hydrotherapy Plan Debridement;Dressing change;Patient/family education;Pulsatile lavage with suction  Wound Therapy - Frequency 6X / week (no hydrotherapy on Sunday, nursing to continue with dressing)  Wound Therapy - Current Recommendations PT;Surgery consult;OT  Wound Therapy - Follow Up Recommendations Skilled nursing facility  Wound Plan will continue with PLS while pt here and needed.   Wound Therapy Goals - Improve the function of patient's integumentary system by progressing the wound(s) through the phases of wound healing by:  Decrease Necrotic Tissue to 0  Decrease Necrotic Tissue - Progress Progressing toward goal  Increase Granulation Tissue to 100  Increase Granulation Tissue - Progress Progressing toward goal  Improve Drainage Characteristics Min  Patient/Family will be able to  recongize symptoms of progress or not and help assist with dressing changes as needed.  Patient/Family Instruction Goal - Progress Progressing toward goal  Goals/treatment plan/discharge plan were made with and agreed upon by patient/family Yes  Time For Goal Achievement 2 weeks  Wound Therapy -  Potential for Goals Alfredo Martinez PT [phone]

## 2014-09-01 NOTE — Progress Notes (Signed)
Clinical Social Work  Patient was discussed during progression meeting and it was reported that patient is not medically stable to DC. CSW spoke with insurance company [employer] [facility]) who is aware of status and agreeable to SNF authorization once stable. CSW will continue to follow.  [place], [place] [phone]

## 2014-09-01 NOTE — Progress Notes (Signed)
Patient ID: [name], female   DOB: October 26, 1940, 74 y.o.   MRN: [id number]     [address] [place], [place] [address]    Phone: [phone] FAX: [phone]     Subjective: Seems to have less diarrhea.   Objective:  Vital signs:  Filed Vitals:   08/31/14 0408 08/31/14 1626 08/31/14 2145 09/01/14 0643  BP: 112/56 135/67 118/62 132/61  Pulse: 84 81 81 98  Temp: 98 F (36.7 C) 97.6 F (36.4 C) 97.6 F (36.4 C) 98 F (36.7 C)  TempSrc: Oral Oral Oral Oral  Resp: _0 Height:      Weight:      SpO2: 100% 94% 100% 100%    Last BM Date: 08/30/14  Intake/Output   Yesterday:  07/14 0701 - 07/15 0700 In: 460 [P.O.:360; IV Piggyback:100] Out: 750 [Urine:750] This shift:    I/O last 3 completed shifts: In: 75 [P.O.:360; IV Piggyback:100] Out: 1300 [Urine:1300]    Physical Exam: General: Pt awake/alert/oriented x4 in no acute distress Skin: dressing c/d/i.     Problem List:   Principal Problem:   Abscess, perirectal s/p I&D 08/23/2014 Active Problems:   Essential hypertension   Inflammatory bowel disease (IBD) with colitis   Sepsis   Infection due to yeast   Acute blood loss anemia   Leukocytosis   Hypokalemia   Hyponatremia   General weakness   Thrombocytopenia   Ulcerative colitis   DVT (deep venous thrombosis)   DVT (deep vein thrombosis) in pregnancy    Results:   Labs: Results for orders placed or performed during the hospital encounter of 08/22/14 (from the past 48 hour(s))  CBC     Status: Abnormal   Collection Time: 08/31/14  5:00 AM  Result Value Ref Range   WBC 14.1 (H) 4.0 - 10.5 K/uL   RBC 2.67 (L) 3.87 - 5.11 MIL/uL   Hemoglobin 8.3 (L) 12.0 - 15.0 g/dL   HCT 25.5 (L) 36.0 - 46.0 %   MCV 95.5 78.0 - 100.0 fL   MCH 31.1 26.0 - 34.0 pg   MCHC 32.5 30.0 - 36.0 g/dL   RDW 15.2 11.5 - 15.5 %   Platelets 242 150 - 400 K/uL  Protime-INR     Status: None    Collection Time: 08/31/14 12:00 PM  Result Value Ref Range   Prothrombin Time 13.4 11.6 - 15.2 seconds   INR 1.00 0.00 - 1.49  Glucose, capillary     Status: Abnormal   Collection Time: 08/31/14  9:43 PM  Result Value Ref Range   Glucose-Capillary 123 (H) 65 - 99 mg/dL   Comment 1 Notify RN   CBC     Status: Abnormal   Collection Time: 09/01/14  5:25 AM  Result Value Ref Range   WBC 13.1 (H) 4.0 - 10.5 K/uL   RBC 2.55 (L) 3.87 - 5.11 MIL/uL   Hemoglobin 7.9 (L) 12.0 - 15.0 g/dL   HCT 24.4 (L) 36.0 - 46.0 %   MCV 95.7 78.0 - 100.0 fL   MCH 31.0 26.0 - 34.0 pg   MCHC 32.4 30.0 - 36.0 g/dL   RDW 15.4 11.5 - 15.5 %   Platelets 247 150 - 400 K/uL    Imaging / Studies: Ir Fluoro Guide Cv Line Right  08/30/2014   INDICATION: Poor venous access. In need of intravenous access for medication administration and blood draws.  Patient with complex past medical history significant for CAD, cerebrovascular disease, DM, dyslipidemia, hypertension, ulcerative colitis and recent hospitalization from 08/07/2014 through 08/21/2014 for ulcerative colitis flare who presented to [facility] with perianal pain which started the night prior to the admission. CT scan on admission demonstrated a complex perirectal infection extending up toward the retroperitoneal area. She underwent I&D of the rectal abscess on 08/22/2014 but has since had issues with persistent rectal bleeding.  Patient developed a left lower extremity pain and swelling for which a left lower extremity venous Doppler ultrasound was performed on 08/29/2014 and demonstrating a left peroneal and proximal calf DVT.  As such, request now received for IVC filter placement due to anticoagulation contraindication.  EXAM: 1. ULTRASOUND GUIDANCE FOR VASCULAR ACCESS 2. IVC CATHETERIZATION AND VENOGRAM 3. IVC FILTER INSERTION 4. ULTRASOUND FLUOROSCOPIC GUIDED RIGHT BRACHIAL VEIN PICC LINE PLACEMENT  COMPARISON:  CT the abdomen and pelvis - 08/22/2014   MEDICATIONS: Fentanyl 50 mcg IV; Versed 2 mg IV  ANESTHESIA/SEDATION: Sedation Time  32 minutes  CONTRAST:  None. The inferior vena cavagram was performed with CO2 given history of contrast allergy  FLUOROSCOPY TIME:  1 minute (505 mGy)  COMPLICATIONS: None immediate  PROCEDURE: Informed consent was obtained from the patient following explanation of the procedure, risks, benefits and alternatives for both IVC filter placement as well as PICC line placement. The patient understands, agrees and consents for both procedures. All questions were addressed. A time out was performed prior to the initiation of the procedure.  The right upper extremity was prepped with chlorhexidine in a sterile fashion, and a sterile drape was applied covering the operative field. Maximum barrier sterile technique with sterile gowns and gloves were used for the procedure. A timeout was performed prior to the initiation of the procedure. Local anesthesia was provided with 1% lidocaine.  Under direct ultrasound guidance, the right brachial vein was accessed with a micropuncture kit after the overlying soft tissues were anesthetized with 1% lidocaine. Incidental note was made of a proximal bifurcation of the brachial artery below the level of the elbow joint. An ultrasound image was saved for documentation purposes. A guidewire was advanced to the level of the superior caval-atrial junction for measurement purposes and the PICC line was cut to length. A peel-away sheath was placed and a 39 cm, 5 [place], dual lumen was inserted to level of the superior caval-atrial junction. A post procedure spot fluoroscopic was obtained. The catheter easily aspirated and flushed and was sutured in place. A dressing was placed. The patient tolerated the procedure well without immediate post procedural complication.  Attention was now paid towards placement of the IVC filter.  Again, maximal barrier sterile technique utilized including caps, mask, sterile  gowns, sterile gloves, large sterile drape, hand hygiene, and Betadine prep.  Under sterile condition and local anesthesia, right internal jugular venous access was performed with ultrasound. An ultrasound image was saved and sent to PACS. Over a guidewire, the IVC filter delivery sheath and inner dilator were advanced into the IVC just above the IVC bifurcation. CO2 injection was performed for an IVC venogram.  Through the delivery sheath, a retrievable Denali IVC filter was deployed below the level of the renal veins and above the IVC bifurcation. Limited post deployment venacavagram was performed.  The delivery sheath was removed and hemostasis was obtained with manual compression. A dressing was placed.  The patient tolerated the above procedures well without immediate post procedural complication.  FINDINGS: After PICC line placement,  the tip terminates within the superior cavoatrial junction.  The IVC is patent. No evidence of thrombus, stenosis, or occlusion. No variant venous anatomy. Successful placement of the IVC filter below the level of the renal veins.  IMPRESSION: 1. Successful placement of a right brachial vein approach dual lumen PICC line with tip terminating within the superior cavoatrial junction. The PICC line is ready for immediate use. 2. Successful ultrasound and fluoroscopically guided placement of an infrarenal retrievable IVC filter via right jugular approach.  PLAN: Due to patient related comorbidities and/or clinical necessity, this IVC filter should be considered a permanent device. This patient will not be actively followed for future filter retrieval.   Electronically Signed   By: Sandi Mariscal M.D.   On: 08/30/2014 14:14   Ir US Guide Vasc Access Right  08/30/2014   INDICATION: Poor venous access. In need of intravenous access for medication administration and blood draws.  Patient with complex past medical history significant for CAD, cerebrovascular disease, DM, dyslipidemia,  hypertension, ulcerative colitis and recent hospitalization from 08/07/2014 through 08/21/2014 for ulcerative colitis flare who presented to [facility] with perianal pain which started the night prior to the admission. CT scan on admission demonstrated a complex perirectal infection extending up toward the retroperitoneal area. She underwent I&D of the rectal abscess on 08/22/2014 but has since had issues with persistent rectal bleeding.  Patient developed a left lower extremity pain and swelling for which a left lower extremity venous Doppler ultrasound was performed on 08/29/2014 and demonstrating a left peroneal and proximal calf DVT.  As such, request now received for IVC filter placement due to anticoagulation contraindication.  EXAM: 1. ULTRASOUND GUIDANCE FOR VASCULAR ACCESS 2. IVC CATHETERIZATION AND VENOGRAM 3. IVC FILTER INSERTION 4. ULTRASOUND FLUOROSCOPIC GUIDED RIGHT BRACHIAL VEIN PICC LINE PLACEMENT  COMPARISON:  CT the abdomen and pelvis - 08/22/2014  MEDICATIONS: Fentanyl 50 mcg IV; Versed 2 mg IV  ANESTHESIA/SEDATION: Sedation Time  32 minutes  CONTRAST:  None. The inferior vena cavagram was performed with CO2 given history of contrast allergy  FLUOROSCOPY TIME:  1 minute (270 mGy)  COMPLICATIONS: None immediate  PROCEDURE: Informed consent was obtained from the patient following explanation of the procedure, risks, benefits and alternatives for both IVC filter placement as well as PICC line placement. The patient understands, agrees and consents for both procedures. All questions were addressed. A time out was performed prior to the initiation of the procedure.  The right upper extremity was prepped with chlorhexidine in a sterile fashion, and a sterile drape was applied covering the operative field. Maximum barrier sterile technique with sterile gowns and gloves were used for the procedure. A timeout was performed prior to the initiation of the procedure. Local anesthesia was provided with  1% lidocaine.  Under direct ultrasound guidance, the right brachial vein was accessed with a micropuncture kit after the overlying soft tissues were anesthetized with 1% lidocaine. Incidental note was made of a proximal bifurcation of the brachial artery below the level of the elbow joint. An ultrasound image was saved for documentation purposes. A guidewire was advanced to the level of the superior caval-atrial junction for measurement purposes and the PICC line was cut to length. A peel-away sheath was placed and a 39 cm, 5 [place], dual lumen was inserted to level of the superior caval-atrial junction. A post procedure spot fluoroscopic was obtained. The catheter easily aspirated and flushed and was sutured in place. A dressing was placed. The patient tolerated the procedure  well without immediate post procedural complication.  Attention was now paid towards placement of the IVC filter.  Again, maximal barrier sterile technique utilized including caps, mask, sterile gowns, sterile gloves, large sterile drape, hand hygiene, and Betadine prep.  Under sterile condition and local anesthesia, right internal jugular venous access was performed with ultrasound. An ultrasound image was saved and sent to PACS. Over a guidewire, the IVC filter delivery sheath and inner dilator were advanced into the IVC just above the IVC bifurcation. CO2 injection was performed for an IVC venogram.  Through the delivery sheath, a retrievable Denali IVC filter was deployed below the level of the renal veins and above the IVC bifurcation. Limited post deployment venacavagram was performed.  The delivery sheath was removed and hemostasis was obtained with manual compression. A dressing was placed.  The patient tolerated the above procedures well without immediate post procedural complication.  FINDINGS: After PICC line placement, the tip terminates within the superior cavoatrial junction.  The IVC is patent. No evidence of thrombus, stenosis,  or occlusion. No variant venous anatomy. Successful placement of the IVC filter below the level of the renal veins.  IMPRESSION: 1. Successful placement of a right brachial vein approach dual lumen PICC line with tip terminating within the superior cavoatrial junction. The PICC line is ready for immediate use. 2. Successful ultrasound and fluoroscopically guided placement of an infrarenal retrievable IVC filter via right jugular approach.  PLAN: Due to patient related comorbidities and/or clinical necessity, this IVC filter should be considered a permanent device. This patient will not be actively followed for future filter retrieval.   Electronically Signed   By: Sandi Mariscal M.D.   On: 08/30/2014 14:14   Ir Radiologist Eval & Mgmt  08/30/2014   INDICATION: Poor venous access. In need of intravenous access for medication administration and blood draws.  Patient with complex past medical history significant for CAD, cerebrovascular disease, DM, dyslipidemia, hypertension, ulcerative colitis and recent hospitalization from 08/07/2014 through 08/21/2014 for ulcerative colitis flare who presented to [facility] with perianal pain which started the night prior to the admission. CT scan on admission demonstrated a complex perirectal infection extending up toward the retroperitoneal area. She underwent I&D of the rectal abscess on 08/22/2014 but has since had issues with persistent rectal bleeding.  Patient developed a left lower extremity pain and swelling for which a left lower extremity venous Doppler ultrasound was performed on 08/29/2014 and demonstrating a left peroneal and proximal calf DVT.  As such, request now received for IVC filter placement due to anticoagulation contraindication.  EXAM: 1. ULTRASOUND GUIDANCE FOR VASCULAR ACCESS 2. IVC CATHETERIZATION AND VENOGRAM 3. IVC FILTER INSERTION 4. ULTRASOUND FLUOROSCOPIC GUIDED RIGHT BRACHIAL VEIN PICC LINE PLACEMENT  COMPARISON:  CT the abdomen and pelvis  - 08/22/2014  MEDICATIONS: Fentanyl 50 mcg IV; Versed 2 mg IV  ANESTHESIA/SEDATION: Sedation Time  32 minutes  CONTRAST:  None. The inferior vena cavagram was performed with CO2 given history of contrast allergy  FLUOROSCOPY TIME:  1 minute (209 mGy)  COMPLICATIONS: None immediate  PROCEDURE: Informed consent was obtained from the patient following explanation of the procedure, risks, benefits and alternatives for both IVC filter placement as well as PICC line placement. The patient understands, agrees and consents for both procedures. All questions were addressed. A time out was performed prior to the initiation of the procedure.  The right upper extremity was prepped with chlorhexidine in a sterile fashion, and a sterile drape was applied covering the  operative field. Maximum barrier sterile technique with sterile gowns and gloves were used for the procedure. A timeout was performed prior to the initiation of the procedure. Local anesthesia was provided with 1% lidocaine.  Under direct ultrasound guidance, the right brachial vein was accessed with a micropuncture kit after the overlying soft tissues were anesthetized with 1% lidocaine. Incidental note was made of a proximal bifurcation of the brachial artery below the level of the elbow joint. An ultrasound image was saved for documentation purposes. A guidewire was advanced to the level of the superior caval-atrial junction for measurement purposes and the PICC line was cut to length. A peel-away sheath was placed and a 39 cm, 5 [place], dual lumen was inserted to level of the superior caval-atrial junction. A post procedure spot fluoroscopic was obtained. The catheter easily aspirated and flushed and was sutured in place. A dressing was placed. The patient tolerated the procedure well without immediate post procedural complication.  Attention was now paid towards placement of the IVC filter.  Again, maximal barrier sterile technique utilized including caps, mask,  sterile gowns, sterile gloves, large sterile drape, hand hygiene, and Betadine prep.  Under sterile condition and local anesthesia, right internal jugular venous access was performed with ultrasound. An ultrasound image was saved and sent to PACS. Over a guidewire, the IVC filter delivery sheath and inner dilator were advanced into the IVC just above the IVC bifurcation. CO2 injection was performed for an IVC venogram.  Through the delivery sheath, a retrievable Denali IVC filter was deployed below the level of the renal veins and above the IVC bifurcation. Limited post deployment venacavagram was performed.  The delivery sheath was removed and hemostasis was obtained with manual compression. A dressing was placed.  The patient tolerated the above procedures well without immediate post procedural complication.  FINDINGS: After PICC line placement, the tip terminates within the superior cavoatrial junction.  The IVC is patent. No evidence of thrombus, stenosis, or occlusion. No variant venous anatomy. Successful placement of the IVC filter below the level of the renal veins.  IMPRESSION: 1. Successful placement of a right brachial vein approach dual lumen PICC line with tip terminating within the superior cavoatrial junction. The PICC line is ready for immediate use. 2. Successful ultrasound and fluoroscopically guided placement of an infrarenal retrievable IVC filter via right jugular approach.  PLAN: Due to patient related comorbidities and/or clinical necessity, this IVC filter should be considered a permanent device. This patient will not be actively followed for future filter retrieval.   Electronically Signed   By: Sandi Mariscal M.D.   On: 08/30/2014 14:14    Medications / Allergies:  Scheduled Meds: . acetaminophen  1,000 mg Oral TID  . acetaminophen  650 mg Oral Once  . colestipol  3 g Oral Q12H  . diphenhydrAMINE  50 mg Oral Once  . fluconazole  400 mg Oral Daily  . hydrALAZINE  10 mg Oral 3 times  per day  . inFLIXimab (REMICADE) infusion  5 mg/kg Intravenous Once  . lip balm  1 application Topical BID  . mesalamine  4.8 g Oral Q breakfast  . nystatin   Topical BID  . piperacillin-tazobactam (ZOSYN)  IV  3.375 g Intravenous 3 times per day  . psyllium  1 packet Oral BID  . saccharomyces boulardii  250 mg Oral BID  . sodium chloride  10-40 mL Intracatheter Q12H  . sodium chloride  3 mL Intravenous Q12H  . sodium chloride  3 mL  Intravenous Q12H   Continuous Infusions:  PRN Meds:.sodium chloride, alum & mag hydroxide-simeth, bismuth subsalicylate, loperamide, menthol-cetylpyridinium, morphine injection, ondansetron, ondansetron **OR** ondansetron (ZOFRAN) IV, oxyCODONE, phenol, sodium chloride, sodium chloride  Antibiotics: Anti-infectives    Start     Dose/Rate Route Frequency Ordered Stop   08/28/14 0000  fluconazole (DIFLUCAN) 200 MG tablet     400 mg Oral Daily 08/28/14 1052     08/28/14 0000  amoxicillin-clavulanate (AUGMENTIN) 875-125 MG per tablet     1 tablet Oral 2 times daily 08/28/14 1052     08/25/14 2000  vancomycin (VANCOCIN) IVPB 1000 mg/200 mL premix  Status:  Discontinued     1,000 mg 200 mL/hr over 60 Minutes Intravenous Every 12 hours 08/25/14 1009 08/26/14 0949   08/25/14 1200  fluconazole (DIFLUCAN) tablet 400 mg     400 mg Oral Daily 08/25/14 0943     08/23/14 1000  vancomycin (VANCOCIN) IVPB 750 mg/150 ml premix  Status:  Discontinued     750 mg 150 mL/hr over 60 Minutes Intravenous Every 12 hours 08/22/14 2127 08/25/14 1009   08/23/14 0600  piperacillin-tazobactam (ZOSYN) IVPB 3.375 g     3.375 g 12.5 mL/hr over 240 Minutes Intravenous 3 times per day 08/22/14 2124     08/22/14 2130  piperacillin-tazobactam (ZOSYN) IVPB 3.375 g     3.375 g 100 mL/hr over 30 Minutes Intravenous NOW 08/22/14 2124 08/22/14 2220   08/22/14 2130  [MAR Hold]  vancomycin (VANCOCIN) 2,000 mg in sodium chloride 0.9 % 500 mL IVPB     (MAR Hold since 08/22/14 2338)   2,000  mg 250 mL/hr over 120 Minutes Intravenous NOW 08/22/14 2127 08/23/14 0008         Assessment/Plan Ulcerative colitis POD#10 I&D perirectal abscess---Dr. Zella Richer -continue with hydro and BID wet to dry dressing changes and PRN when soiled. -Bulk up stool -mobilize, turning to avoid further skin breakdown. -GI following, started on remicade. ID-zosyn [id number] per ID recs.  New DVT s/p IVC filter  Erby Pian, ANP-BC [facility] Pager [phone]([id number])   09/01/2014 9:01 AM

## 2014-09-01 NOTE — Progress Notes (Signed)
    Progress Note   Subjective  feels okay today. Not having as much diarrhea, rectal pain not as intense.    Objective   Vital signs in last 24 hours: Temp:  [97.6 F (36.4 C)-98 F (36.7 C)] 98 F (36.7 C) (07/15 0643) Pulse Rate:  [81-98] 98 (07/15 0643) Resp:  [17-18] 18 (07/15 0643) BP: (118-135)/(61-67) 132/61 mmHg (07/15 0643) SpO2:  [94 %-100 %] 100 % (07/15 0643) Last BM Date: 08/30/14 General:    black female in NAD Abdomen:  Soft, nontender and nondistended. Normal bowel sounds. Extremities:  Without edema. Neurologic:  Alert and oriented,  grossly normal neurologically. Psych:  Cooperative. Normal mood and affect.  Lab Results:  Recent Labs  08/30/14 0515 08/31/14 0500 09/01/14 0525  WBC 11.6* 14.1* 13.1*  HGB 9.5* 8.3* 7.9*  HCT 29.5* 25.5* 24.4*  PLT 219 242 247   BMET  Recent Labs  08/29/14 1837 08/30/14 0515  NA 132* 133*  K 3.8 3.6  CL 92* 92*  CO2 29 30  GLUCOSE 87 84  BUN 6 6  CREATININE 0.64 0.60  CALCIUM 7.9* 7.6*      Assessment / Plan:    1. Longstanding ulcerative colitis admitted with large ischio perirectal abscess, s/p I&D 7/516. She has continued to have rectal pain, bleeding from abscess and colitis. We started Cholestyramine yesterday to help bulk stools  All the diarrhea was necessitating frequent dressing changes which is severely painful for patient. We are initiating Remicade today.  Her quantiferon was indeterminate but result can be affected by steroids. CXR late June was negative. Viral B,C studies pending. Prednisone discontinued.  2. LLE DVT, s/p IVC. Now with RLE DVT as well    LOS: 10 days   Tye Savoy  09/01/2014, 9:40 AM

## 2014-09-02 LAB — CBC
HCT: 24.9 % — ABNORMAL LOW (ref 36.0–46.0)
Hemoglobin: 7.9 g/dL — ABNORMAL LOW (ref 12.0–15.0)
MCH: 30.3 pg (ref 26.0–34.0)
MCHC: 31.7 g/dL (ref 30.0–36.0)
MCV: 95.4 fL (ref 78.0–100.0)
PLATELETS: 291 10*3/uL (ref 150–400)
RBC: 2.61 MIL/uL — ABNORMAL LOW (ref 3.87–5.11)
RDW: 15.5 % (ref 11.5–15.5)
WBC: 18.4 10*3/uL — ABNORMAL HIGH (ref 4.0–10.5)

## 2014-09-02 LAB — BASIC METABOLIC PANEL
Anion gap: 9 (ref 5–15)
BUN: 7 mg/dL (ref 6–20)
CHLORIDE: 93 mmol/L — AB (ref 101–111)
CO2: 30 mmol/L (ref 22–32)
Calcium: 7.4 mg/dL — ABNORMAL LOW (ref 8.9–10.3)
Creatinine, Ser: 0.65 mg/dL (ref 0.44–1.00)
GFR calc Af Amer: >60 mL/min (ref >60)
GFR calc non Af Amer: >60 mL/min (ref >60)
GLUCOSE: 69 mg/dL (ref 65–99)
POTASSIUM: 3.4 mmol/L — AB (ref 3.5–5.1)
Sodium: 132 mmol/L — ABNORMAL LOW (ref 135–145)

## 2014-09-02 LAB — HEPATITIS C ANTIBODY: HCV AB: 0.1 {s_co_ratio} (ref 0.0–0.9)

## 2014-09-02 LAB — HIGH SENSITIVITY CRP: CRP, High Sensitivity: 67.79 mg/L — ABNORMAL HIGH (ref 0.00–3.00)

## 2014-09-02 LAB — HEPATITIS B SURFACE ANTIGEN: HEP B S AG: NEGATIVE

## 2014-09-02 LAB — HEPATITIS B CORE ANTIBODY, TOTAL: HEP B C TOTAL AB: NEGATIVE

## 2014-09-02 MED ORDER — PEG 3350-KCL-NA BICARB-NACL 420 G PO SOLR: 4000.0000 mL | Freq: Once | ORAL | Status: DC

## 2014-09-02 MED ORDER — POTASSIUM CHLORIDE CRYS ER 20 MEQ PO TBCR
40.0000 meq | EXTENDED_RELEASE_TABLET | ORAL | Status: AC
  Administered 2014-09-02: 40 meq via ORAL
  Filled 2014-09-02 (×2): qty 2

## 2014-09-02 NOTE — Progress Notes (Addendum)
[facility] Progress Note   [name] TKW:[id number] DOB: [birth year] DOA: 08/22/2014 PCP: Kandice Hams, MD  Brief narrative: 74 y.o. female with past medical history of dyslipidemia, hypertension, long standing ulcerative colitis and recently hospitalized from 08/07/2014 through 08/21/2014 for ulcerative colitis flare. She had flexible sigmoidoscopy 08/08/2014 which demonstrated inflammatory changes from the rectum consistent with ulcerative colitis. She presented to [facility] with perianal pain started the night prior to the admission. CT scan on the admission demonstrated a complex perirectal infection extending up toward the retroperitoneal area.  08/21/13 underwent incision and drainage of the large issue rectal abscess on the right side. She was started on vancomycin and Zosyn. Postprocedure she was doing fine but has developed large amount of bleed on the morning of 08/23/2014. GI was consulted and pt was started on prednisone and mesalamine.  Hospital course is complicated with ongoing rectal bleed so  Additionally, she was found to have left LE DVT On venous doppler done 7/12.   Subjective: No abdominal pain currently- occurring on and off- no vomiting-   Assessment/Plan: Sepsis secondary to peri-rectal abscess / leukocytosis  - Sepsis criteria met on the admission with tachycardia, tachypnea, hypotension, leukocytosis. Source of infection is perirectal abscess. - S/P incision and drainage by surgery 08/23/2014 - Started on vancomycin and Zosyn at the time of the admission. Abscess culture demonstrated rare yeast and multiple other organisms/ none predominant - Blood cultures to date are negative.  - Vancomycin was stopped 08/26/2014. Currently on Zosyn and fluconazole. -Dr. Megan Salon of infectious disease recommended total duration of antibiotics - 14 days- stop after evening dose on 7/18. Once she is stable for discharge she can be switched to Augmentin and PO  fluconazole.  Bloody diarrhea due to UC  - Patient had a rectal bleed after incision and drainage done 08/23/2014 which has continued - incontinent of stool which is soiling the wound from rectal abscess -GI consulted and pt started on prednisone and mesalamine which was not effective in controlling diarrhea/ bleeding and therefore started on Remicaid 7/15  Acute blood loss anemia  - Hb 7.9 today- will cont to follow Hb and transfuse if lower   Essential hypertension - SBP was elevated in 150s - Added hydralazine for better BP control which is helping   B/L lower extremity DVT - Noted on LLE doppler - Not a candidate for anticoagulation due to ongoing active rectal bleed - Patient already has IVC filter in place    Hyponatremia - Likely related to acute infection, sepsis - Sodium stable at 131 - 132   Hypokalemia - Likely from acute infection, GI losses - Supplemented    Generalized weakness  - Patient will be discharged to skilled nursing facility once medically stable.   Code Status: full code Family Communication:  Disposition Plan:  DVT prophylaxis: SCDs-  Consultants:  Gastroenterology  Surgery  Interventional radiology  Procedures:  IVC filter present  PICC line  venous duplex  Antibiotics: Anti-infectives    Start     Dose/Rate Route Frequency Ordered Stop   08/28/14 0000  fluconazole (DIFLUCAN) 200 MG tablet     400 mg Oral Daily 08/28/14 1052     08/28/14 0000  amoxicillin-clavulanate (AUGMENTIN) 875-125 MG per tablet     1 tablet Oral 2 times daily 08/28/14 1052     08/25/14 2000  vancomycin (VANCOCIN) IVPB 1000 mg/200 mL premix  Status:  Discontinued     1,000 mg 200 mL/hr over 60 Minutes Intravenous Every 12  hours 08/25/14 1009 08/26/14 0949   08/25/14 1200  fluconazole (DIFLUCAN) tablet 400 mg     400 mg Oral Daily 08/25/14 0943     08/23/14 1000  vancomycin (VANCOCIN) IVPB 750 mg/150 ml premix  Status:  Discontinued     750 mg 150  mL/hr over 60 Minutes Intravenous Every 12 hours 08/22/14 2127 08/25/14 1009   08/23/14 0600  piperacillin-tazobactam (ZOSYN) IVPB 3.375 g     3.375 g 12.5 mL/hr over 240 Minutes Intravenous 3 times per day 08/22/14 2124     08/22/14 2130  piperacillin-tazobactam (ZOSYN) IVPB 3.375 g     3.375 g 100 mL/hr over 30 Minutes Intravenous NOW 08/22/14 2124 08/22/14 2220   08/22/14 2130  [MAR Hold]  vancomycin (VANCOCIN) 2,000 mg in sodium chloride 0.9 % 500 mL IVPB     (MAR Hold since 08/22/14 2338)   2,000 mg 250 mL/hr over 120 Minutes Intravenous NOW 08/22/14 2127 08/23/14 0008      Objective: Filed Weights   08/22/14 2113 08/23/14 0153 08/23/14 0434  Weight: 99.791 kg (220 lb) 102.3 kg (225 lb 8.5 oz) 104 kg (229 lb 4.5 oz)    Intake/Output Summary (Last 24 hours) at 09/02/14 1605 Last data filed at 09/02/14 0512  Gross per 24 hour  Intake    150 ml  Output    900 ml  Net   -750 ml     Vitals Filed Vitals:   09/01/14 0643 09/01/14 1454 09/02/14 0511 09/02/14 1505  BP: 132/61 134/64 117/62 122/51  Pulse: 98 94 101 90  Temp: 98 F (36.7 C) 98.9 F (37.2 C) 98.4 F (36.9 C) 98.5 F (36.9 C)  TempSrc: Oral Oral Oral Oral  Resp: 18 20 18 18   Height:      Weight:      SpO2: 100% 95% 99% 98%    Exam:  General:  Pt is alert, not in acute distress  HEENT: No icterus, No thrush, oral mucosa moist  Cardiovascular: regular rate and rhythm, S1/S2 No murmur  Respiratory: clear to auscultation bilaterally   Abdomen: Soft, +Bowel sounds,  Tender in suprapubic area, non distended, no guarding  MSK: No LE edema, cyanosis or clubbing  Data Reviewed: Basic Metabolic Panel:  Recent Labs Lab 08/27/14 0756 08/28/14 0440 08/29/14 1837 08/30/14 0515 09/02/14 0431  NA 132* 131* 132* 133* 132*  K 3.2* 3.3* 3.8 3.6 3.4*  CL 96* 93* 92* 92* 93*  CO2 29 30 29 30 30   GLUCOSE 95 77 87 84 69  BUN <5* <5* 6 6 7   CREATININE 0.64 0.61 0.64 0.60 0.65  CALCIUM 7.3* 7.4* 7.9* 7.6*  7.4*   Liver Function Tests: No results for input(s): AST, ALT, ALKPHOS, BILITOT, PROT, ALBUMIN in the last 168 hours. No results for input(s): LIPASE, AMYLASE in the last 168 hours. No results for input(s): AMMONIA in the last 168 hours. CBC:  Recent Labs Lab 08/29/14 1837 08/30/14 0515 08/31/14 0500 09/01/14 0525 09/02/14 0431  WBC 13.8* 11.6* 14.1* 13.1* 18.4*  HGB 10.2* 9.5* 8.3* 7.9* 7.9*  HCT 31.5* 29.5* 25.5* 24.4* 24.9*  MCV 95.5 95.5 95.5 95.7 95.4  PLT 231 219 242 247 291   Cardiac Enzymes: No results for input(s): CKTOTAL, CKMB, CKMBINDEX, TROPONINI in the last 168 hours. BNP (last 3 results) No results for input(s): BNP in the last 8760 hours.  ProBNP (last 3 results) No results for input(s): PROBNP in the last 8760 hours.  CBG:  Recent Labs Lab 08/28/14 [phone]  08/28/14 0843 08/28/14 0903 08/28/14 0951 08/31/14 2143  GLUCAP 60* 56* 66 88 123*    Recent Results (from the past 240 hour(s))  Clostridium Difficile by PCR (not at [facility])     Status: None   Collection Time: 08/28/14  7:11 PM  Result Value Ref Range Status   C difficile by pcr NEGATIVE NEGATIVE Final     Studies: No results found.  Scheduled Meds:  Scheduled Meds: . acetaminophen  1,000 mg Oral TID  . colestipol  3 g Oral Q12H  . fluconazole  400 mg Oral Daily  . hydrALAZINE  10 mg Oral 3 times per day  . lip balm  1 application Topical BID  . mesalamine  4.8 g Oral Q breakfast  . nystatin   Topical BID  . piperacillin-tazobactam (ZOSYN)  IV  3.375 g Intravenous 3 times per day  . psyllium  1 packet Oral BID  . saccharomyces boulardii  250 mg Oral BID  . sodium chloride  10-40 mL Intracatheter Q12H  . sodium chloride  3 mL Intravenous Q12H  . sodium chloride  3 mL Intravenous Q12H   Continuous Infusions:   Time spent on care of this patient: 35 min   Cylinder, MD 09/02/2014, 4:05 PM  LOS: 11 days   [facility] Office  [phone] Pager - Text Page per  [email or web address] If 7PM-7AM, please contact night-coverage [email or web address]

## 2014-09-02 NOTE — Progress Notes (Addendum)
Cross cover  LHC-GISubjective: Patient seems to be doing much better today. Denies having any abdominal pain, nausea, vomiting or diarrhea. No BM today. She feels the Remicade may be helping;  Objective: Vital signs in last 24 hours: Temp:  [98.4 F (36.9 C)-98.9 F (37.2 C)] 98.4 F (36.9 C) (07/16 0511) Pulse Rate:  [94-101] 101 (07/16 0511) Resp:  [18-20] 18 (07/16 0511) BP: (117-134)/(62-64) 117/62 mmHg (07/16 0511) SpO2:  [95 %-99 %] 99 % (07/16 0511) Last BM Date: 09/01/14  Intake/Output from previous day: 07/15 0701 - 07/16 0700 In: 150 [IV Piggyback:150] Out: 1600 [Urine:1600] Intake/Output this shift:    General appearance: alert, cooperative, appears stated age, no distress and morbidly obese Resp: clear to auscultation bilaterally Cardio: regular rate and rhythm, S1, S2 normal, no murmur, click, rub or gallop GI: soft, non-tender; bowel sounds normal; no masses,  no organomegaly  Lab Results:  Recent Labs  08/31/14 0500 09/01/14 0525 09/02/14 0431  WBC 14.1* 13.1* 18.4*  HGB 8.3* 7.9* 7.9*  HCT 25.5* 24.4* 24.9*  PLT 242 247 291   BMET  Recent Labs  09/02/14 0431  NA 132*  K 3.4*  CL 93*  CO2 30  GLUCOSE 69  BUN 7  CREATININE 0.65  CALCIUM 7.4*    Recent Labs  08/31/14 1200  LABPROT 13.4  INR 1.00   Hepatitis Panel  Recent Labs  08/31/14 2020  HEPBSAG Negative  HCVAB 0.1   Medications: I have reviewed the patient's current medications.  Assessment/Plan: 1) IBD with acute activation of colitis was on steroids and now has received her first dose of Remicade and is on Lialda. Much improved.  2) Perirectal abscess-on hydrotherapy and Zosyn.  3) DVT-IVC filter in place.  LOS: 11 days   Minor Iden 09/02/2014, 10:06 AM

## 2014-09-02 NOTE — Progress Notes (Signed)
Physical Therapy Hydrotherapy Treatment Note    09/02/14 1400  Subjective Assessment  Subjective "I'm not ready, but do what you gotta do."  Patient and Family Stated Goals I [place] this year.   Date of Onset 08/27/14  Prior Treatments Pt in with flare up of ulverative colitis with paina n diarrhea,a nd pain in perirectal area. I &D on 08/23/2014 for drainage adn opening of perirectal abscess. Penrose drains were in place earlier this week, but have been removed by surgery and hydrotherapy consult put in place.   Evaluation and Treatment  Evaluation and Treatment Procedures Explained to Patient/Family Yes  Evaluation and Treatment Procedures agreed to  [REMOVED] Wound / Incision (Open or Dehisced) 08/27/14 Other (Comment) Rectum Right Right Perirectal wound (HYDRO)  Final Assessment Date/Final Assessment Time: 08/27/14 1200  Date First Assessed/Time First Assessed: 08/27/14 1100   Wound Type: Other (Comment)  Location: Rectum  Location Orientation: Right  Wound Description (Comments): Right Perirectal wound (HYDR...  Dressing Type Gauze (Comment);Moist to dry (4- 4x4's hypofix)  Dressing Changed Changed  Dressing Status Old drainage (brown, dusky, appearance of BM on old packing)  Dressing Change Frequency PRN  Site / Wound Assessment Brown;Painful;Pale  % Wound base Red or Granulating 40% (pink tissue near opening, unable to inspect deep)  % Wound base Other (Comment) 60% (unable to visualize entire wound bed)  Peri-wound Assessment Other (Comment) (pt just used bed pan with appearance of BM in wound.)  Margins Unattached edges (unapproximated)  Closure None  Drainage Amount Copious  Drainage Description Other (Comment) (dark drainage? BM)  Non-staged Wound Description Full thickness  Treatment Hydrotherapy (Pulse lavage);Packing (Saline gauze)  Hydrotherapy  Pulsed lavage therapy - wound location right  perirectal   Pulsed Lavage with Suction (psi) 4 psi  Pulsed Lavage with Suction - Normal Saline Used 1000 mL (only used 500 )  Pulsed Lavage Tip Tip with splash shield  Wound Therapy - Assess/Plan/Recommendations  Wound Therapy - Clinical Statement Pt with perirectal abscess that had an I&D on 08/23/2014 and remain open and draining. Hard to visualize wound bed due to depth and location. Visible tissue is less pink than previous. Kerlix packed into wound which was very painful to patient. Dressing packed in would appears dark/brown with possible BM contamination in wound. PLS used more for washout, unable to inspect deeper wound at base.  Wound Therapy - Functional Problem List deconditioning and unhealing wound  Factors Delaying/Impairing Wound Healing Incontinence;Multiple medical problems  Hydrotherapy Plan Debridement;Dressing change;Patient/family education;Pulsatile lavage with suction  Wound Therapy - Frequency 6X / week (no hydrotherapy on Sunday, nursing to continue with dressing)  Wound Therapy - Current Recommendations PT;Surgery consult;OT  Wound Therapy - Follow Up Recommendations Skilled nursing facility  Wound Plan will continue with PLS while pt here as needed.   Wound Therapy Goals - Improve the function of patient's integumentary system by progressing the wound(s) through the phases of wound healing by:  Decrease Necrotic Tissue to 0  Decrease Necrotic Tissue - Progress Progressing toward goal  Increase Granulation Tissue to 100  Increase Granulation Tissue - Progress Progressing toward goal  Improve Drainage Characteristics Min  Improve Drainage Characteristics - Progress Progressing toward goal  Patient/Family will be able to  recognize symptoms of progress or not and help assist with dressing changes as needed.  Patient/Family Instruction Goal - Progress Progressing toward goal  Goals/treatment plan/discharge plan were made with  and agreed upon by patient/family Yes  Time  For Goal Achievement 2 weeks  Wound Therapy - Potential for Goals Good   Today's Date: 09/02/2014 Time: 6701-4103 Time Calculation (min): 25 min   [name], PT, DPT 09/02/2014 Pager: [phone]

## 2014-09-02 NOTE — Progress Notes (Signed)
Patient ID: [name], female   DOB: January 18, 1941, 74 y.o.   MRN: [id number] 11 Days Post-Op  Subjective: States she feels better today. Gradually less pain from abscess site. Diarrhea is improved.  Objective: Vital signs in last 24 hours: Temp:  [98.4 F (36.9 C)-98.9 F (37.2 C)] 98.4 F (36.9 C) (07/16 0511) Pulse Rate:  [94-101] 101 (07/16 0511) Resp:  [18-20] 18 (07/16 0511) BP: (117-134)/(62-64) 117/62 mmHg (07/16 0511) SpO2:  [95 %-99 %] 99 % (07/16 0511) Last BM Date: 09/01/14  Intake/Output from previous day: 07/15 0701 - 07/16 0700 In: 150 [IV Piggyback:150] Out: 1600 [Urine:1600] Intake/Output this shift:    General appearance: alert, cooperative and no distress Incision/Wound: wound examined and packing partially removed. Appears clean without unusual drainage or induration or erythema  Lab Results:   Recent Labs  09/01/14 0525 09/02/14 0431  WBC 13.1* 18.4*  HGB 7.9* 7.9*  HCT 24.4* 24.9*  PLT 247 291   BMET  Recent Labs  09/02/14 0431  NA 132*  K 3.4*  CL 93*  CO2 30  GLUCOSE 69  BUN 7  CREATININE 0.65  CALCIUM 7.4*     Studies/Results: No results found.  Anti-infectives: Anti-infectives    Start     Dose/Rate Route Frequency Ordered Stop   08/28/14 0000  fluconazole (DIFLUCAN) 200 MG tablet     400 mg Oral Daily 08/28/14 1052     08/28/14 0000  amoxicillin-clavulanate (AUGMENTIN) 875-125 MG per tablet     1 tablet Oral 2 times daily 08/28/14 1052     08/25/14 2000  vancomycin (VANCOCIN) IVPB 1000 mg/200 mL premix  Status:  Discontinued     1,000 mg 200 mL/hr over 60 Minutes Intravenous Every 12 hours 08/25/14 1009 08/26/14 0949   08/25/14 1200  fluconazole (DIFLUCAN) tablet 400 mg     400 mg Oral Daily 08/25/14 0943     08/23/14 1000  vancomycin (VANCOCIN) IVPB 750 mg/150 ml premix  Status:  Discontinued     750 mg 150 mL/hr over 60 Minutes Intravenous Every 12 hours 08/22/14 2127 08/25/14 1009   08/23/14 0600   piperacillin-tazobactam (ZOSYN) IVPB 3.375 g     3.375 g 12.5 mL/hr over 240 Minutes Intravenous 3 times per day 08/22/14 2124     08/22/14 2130  piperacillin-tazobactam (ZOSYN) IVPB 3.375 g     3.375 g 100 mL/hr over 30 Minutes Intravenous NOW 08/22/14 2124 08/22/14 2220   08/22/14 2130  [MAR Hold]  vancomycin (VANCOCIN) 2,000 mg in sodium chloride 0.9 % 500 mL IVPB     (MAR Hold since 08/22/14 2338)   2,000 mg 250 mL/hr over 120 Minutes Intravenous NOW 08/22/14 2127 08/23/14 0008      Assessment/Plan: s/p Procedure(s): IRRIGATION AND DEBRIDEMENT PERIRECTAL ABSCESS Ulcerative colitis Seems improved today. White blood count is elevated, question medication effect. Perirectal wound seems stable/improved. Continue current wound care.   LOS: 11 days    Genna Casimir T 09/02/2014

## 2014-09-02 NOTE — Progress Notes (Signed)
Physical Therapy Treatment Patient Details Name: [name] MRN: [id number] DOB: [birth year] Today's Date: 25-Sep-2014    History of Present Illness 74 yo female adm with complex perirectal infection extending up toward the retroperitoneal area; pt underwent I& D on 08/23/14; PMHx:  HTN, CVA, ulcerative colitis    PT Comments    Pt assisted with standing a couple times at EOB.  Pt presents with increased weakness and limited endurance.  Continue to recommend SNF upon d/c.  Follow Up Recommendations  SNF     Equipment Recommendations  None recommended by PT    Recommendations for Other Services       Precautions / Restrictions Precautions Precautions: Fall    Mobility  Bed Mobility Overal bed mobility: Needs Assistance Bed Mobility: Supine to Sit;Sit to Supine     Supine to sit: Mod assist;HOB elevated Sit to supine: Max assist;+2 for physical assistance   General bed mobility comments: increased time, assist for trunk upright, more assist back to bed due to weakness and fatigue  Transfers Overall transfer level: Needs assistance Equipment used: Rolling walker (2 wheeled) Transfers: Sit to/from Stand Sit to Stand: From elevated surface;Max assist;+2 physical assistance         General transfer comment: verbal cues for technique, forward trunk to assist with rise, mod assist once standing, unable to hold stand more then 20 sec, performed twice  Ambulation/Gait                 Stairs            Wheelchair Mobility    Modified Rankin (Stroke Patients Only)       Balance Overall balance assessment: Needs assistance         Standing balance support: Bilateral upper extremity supported Standing balance-Leahy Scale: Zero Standing balance comment: requires RW and assist with standing                    Cognition Arousal/Alertness: Awake/alert Behavior During Therapy: WFL for tasks assessed/performed Overall Cognitive Status: Within  Functional Limits for tasks assessed                      Exercises      General Comments        Pertinent Vitals/Pain Pain Assessment: Faces Faces Pain Scale: Hurts even more Pain Location: rectal area Pain Descriptors / Indicators: Constant Pain Intervention(s): Limited activity within patient's tolerance;Monitored during session;Repositioned;Premedicated before session    Home Living                      Prior Function            PT Goals (current goals can now be found in the care plan section) Progress towards PT goals: Progressing toward goals    Frequency  Min 3X/week    PT Plan Current plan remains appropriate    Co-evaluation             End of Session   Activity Tolerance: Patient tolerated treatment well Patient left: with call bell/phone within reach;in bed     Time: 7408-1448 PT Time Calculation (min) (ACUTE ONLY): 14 min  Charges:  $Therapeutic Activity: 8-22 mins                    G Codes:      [name],[name] 09/25/2014, 2:32 PM [name], PT, DPT Sep 25, 2014 Pager: [phone]

## 2014-09-03 MED ORDER — POTASSIUM CHLORIDE CRYS ER 20 MEQ PO TBCR
40.0000 meq | EXTENDED_RELEASE_TABLET | ORAL | Status: AC
  Administered 2014-09-03 (×2): 40 meq via ORAL
  Filled 2014-09-03 (×2): qty 2

## 2014-09-03 NOTE — Progress Notes (Signed)
Patient ID: [name], female   DOB: 09/26/1940, 74 y.o.   MRN: [id number] 12 Days Post-Op  Subjective: Doing well, feels better.  Diarrhea much improved, not aware of any bleeding  Objective: Vital signs in last 24 hours: Temp:  [98.1 F (36.7 C)-100 F (37.8 C)] 100 F (37.8 C) (07/17 0554) Pulse Rate:  [90-104] 104 (07/17 0554) Resp:  [18-20] 20 (07/17 0554) BP: (122-148)/(51-79) 138/79 mmHg (07/17 0554) SpO2:  [94 %-98 %] 96 % (07/17 0554) Last BM Date: 09/01/14  Intake/Output from previous day: 07/16 0701 - 07/17 0700 In: 230 [P.O.:120; I.V.:10; IV Piggyback:100] Out: 900 [Urine:900] Intake/Output this shift:    General appearance: alert, cooperative and no distress GI: normal findings: soft, non-tender Incision/Wound: Examined yesterday, clean  Lab Results:   Recent Labs  09/01/14 0525 09/02/14 0431  WBC 13.1* 18.4*  HGB 7.9* 7.9*  HCT 24.4* 24.9*  PLT 247 291   BMET  Recent Labs  09/02/14 0431  NA 132*  K 3.4*  CL 93*  CO2 30  GLUCOSE 69  BUN 7  CREATININE 0.65  CALCIUM 7.4*     Studies/Results: No results found.  Anti-infectives: Anti-infectives    Start     Dose/Rate Route Frequency Ordered Stop   08/28/14 0000  fluconazole (DIFLUCAN) 200 MG tablet     400 mg Oral Daily 08/28/14 1052     08/28/14 0000  amoxicillin-clavulanate (AUGMENTIN) 875-125 MG per tablet     1 tablet Oral 2 times daily 08/28/14 1052     08/25/14 2000  vancomycin (VANCOCIN) IVPB 1000 mg/200 mL premix  Status:  Discontinued     1,000 mg 200 mL/hr over 60 Minutes Intravenous Every 12 hours 08/25/14 1009 08/26/14 0949   08/25/14 1200  fluconazole (DIFLUCAN) tablet 400 mg     400 mg Oral Daily 08/25/14 0943     08/23/14 1000  vancomycin (VANCOCIN) IVPB 750 mg/150 ml premix  Status:  Discontinued     750 mg 150 mL/hr over 60 Minutes Intravenous Every 12 hours 08/22/14 2127 08/25/14 1009   08/23/14 0600  piperacillin-tazobactam (ZOSYN) IVPB 3.375 g     3.375 g 12.5  mL/hr over 240 Minutes Intravenous 3 times per day 08/22/14 2124     08/22/14 2130  piperacillin-tazobactam (ZOSYN) IVPB 3.375 g     3.375 g 100 mL/hr over 30 Minutes Intravenous NOW 08/22/14 2124 08/22/14 2220   08/22/14 2130  [MAR Hold]  vancomycin (VANCOCIN) 2,000 mg in sodium chloride 0.9 % 500 mL IVPB     (MAR Hold since 08/22/14 2338)   2,000 mg 250 mL/hr over 120 Minutes Intravenous NOW 08/22/14 2127 08/23/14 0008      Assessment/Plan: Ulcerative colitis POD#12 I&D perirectal abscess---Dr. Zella Richer -continue with hydro and BID wet to dry dressing changes and PRN when soiled. -leukocytosis, ? Secondary to meds? -Diarrhea bleeding seems improved on Remicade -mobilize, turning to avoid further skin breakdown. -GI following,. ID-zosyn D 13/14 per ID recs.  New DVT s/p IVC filter Overall improving   LOS: 12 days    Adeyemi Hamad T 09/03/2014

## 2014-09-03 NOTE — Progress Notes (Signed)
[facility] Progress Note   [name] DJS:[id number] DOB: 1940/11/20 DOA: 08/22/2014 PCP: Kandice Hams, MD  Brief narrative: 74 y.o. female with past medical history of dyslipidemia, hypertension, long standing ulcerative colitis and recently hospitalized from 08/07/2014 through 08/21/2014 for ulcerative colitis flare. She had flexible sigmoidoscopy 08/08/2014 which demonstrated inflammatory changes from the rectum consistent with ulcerative colitis. She presented to [facility] long hospital with perianal pain started the night prior to the admission. CT scan on the admission demonstrated a complex perirectal infection extending up toward the retroperitoneal area.  08/21/13 underwent incision and drainage of the large issue rectal abscess on the right side. She was started on vancomycin and Zosyn. Postprocedure she was doing fine but has developed large amount of bleed on the morning of 08/23/2014. GI was consulted and pt was started on prednisone and mesalamine.  Hospital course is complicated with ongoing rectal bleed so  Additionally, she was found to have left LE DVT On venous doppler done 7/12.   Subjective: Pain is controlled-patient examined with nurse-she had had a bowel movement and stool was inside of abscess opening-liquid and nonbloody.  Assessment/Plan: Sepsis secondary to peri-rectal abscess / leukocytosis  - Sepsis criteria met on the admission with tachycardia, tachypnea, hypotension, leukocytosis. Source of infection is perirectal abscess. - S/P incision and drainage by surgery 08/23/2014 - Started on vancomycin and Zosyn at the time of the admission. Abscess culture demonstrated rare yeast and multiple other organisms/ none predominant - Blood cultures to date are negative.  - Vancomycin was stopped 08/26/2014. Currently on Zosyn and fluconazole. -Dr. Megan Salon of infectious disease recommended total duration of antibiotics - 14 days- stop after evening dose on 7/18.  Once she is stable for discharge she can be switched to Augmentin and PO fluconazole.  Bloody diarrhea due to UC  - Patient had a rectal bleed after incision and drainage done 08/23/2014 which has continued -GI consulted and pt started on prednisone and mesalamine which was not effective in controlling diarrhea/ bleeding and therefore started on Remicaid 7/15 -He is incontinent of stool-Stool is still soiling the wound from perirectal abscess and therefore I have requested for a rectal tube to be placed today  Acute blood loss anemia  - Hb 7.9 today- will cont to follow Hb and transfuse if lower   Essential hypertension - SBP was elevated in 150s - Added hydralazine for better BP control which is helping   B/L lower extremity DVT - Noted on LLE doppler - Not a candidate for anticoagulation due to ongoing uncontrolled ulcerative colitis and recent rectal bleed - therefore, an IVC filter was placed   Hyponatremia - Likely related to acute infection, sepsis - Sodium stable at 131 - 132   Hypokalemia - Likely from acute infection, GI losses - Supplemented    Generalized weakness  - Patient will be discharged to skilled nursing facility once medically stable.   Code Status: full code Family Communication:  Disposition Plan:  DVT prophylaxis: SCDs-  Consultants:  Gastroenterology  Surgery  Interventional radiology  Procedures:  IVC filter present  PICC line  venous duplex  Antibiotics: Anti-infectives    Start     Dose/Rate Route Frequency Ordered Stop   08/28/14 0000  fluconazole (DIFLUCAN) 200 MG tablet     400 mg Oral Daily 08/28/14 1052     08/28/14 0000  amoxicillin-clavulanate (AUGMENTIN) 875-125 MG per tablet     1 tablet Oral 2 times daily 08/28/14 1052     08/25/14 2000  vancomycin (VANCOCIN) IVPB 1000 mg/200 mL premix  Status:  Discontinued     1,000 mg 200 mL/hr over 60 Minutes Intravenous Every 12 hours 08/25/14 1009 08/26/14 0949   08/25/14 1200   fluconazole (DIFLUCAN) tablet 400 mg     400 mg Oral Daily 08/25/14 0943     08/23/14 1000  vancomycin (VANCOCIN) IVPB 750 mg/150 ml premix  Status:  Discontinued     750 mg 150 mL/hr over 60 Minutes Intravenous Every 12 hours 08/22/14 2127 08/25/14 1009   08/23/14 0600  piperacillin-tazobactam (ZOSYN) IVPB 3.375 g     3.375 g 12.5 mL/hr over 240 Minutes Intravenous 3 times per day 08/22/14 2124     08/22/14 2130  piperacillin-tazobactam (ZOSYN) IVPB 3.375 g     3.375 g 100 mL/hr over 30 Minutes Intravenous NOW 08/22/14 2124 08/22/14 2220   08/22/14 2130  [MAR Hold]  vancomycin (VANCOCIN) 2,000 mg in sodium chloride 0.9 % 500 mL IVPB     (MAR Hold since 08/22/14 2338)   2,000 mg 250 mL/hr over 120 Minutes Intravenous NOW 08/22/14 2127 08/23/14 0008      Objective: Filed Weights   08/22/14 2113 08/23/14 0153 08/23/14 0434  Weight: 99.791 kg (220 lb) 102.3 kg (225 lb 8.5 oz) 104 kg (229 lb 4.5 oz)    Intake/Output Summary (Last 24 hours) at 09/03/14 1146 Last data filed at 09/03/14 0558  Gross per 24 hour  Intake    230 ml  Output    900 ml  Net   -670 ml     Vitals Filed Vitals:   09/02/14 0511 09/02/14 1505 09/02/14 2135 09/03/14 0554  BP: 117/62 122/51 148/74 138/79  Pulse: 101 90 94 104  Temp: 98.4 F (36.9 C) 98.5 F (36.9 C) 98.1 F (36.7 C) 100 F (37.8 C)  TempSrc: Oral Oral Oral Oral  Resp: _0 Height:      Weight:      SpO2: 99% 98% 94% 96%    Exam:  General:  Pt is alert, not in acute distress  HEENT: No icterus, No thrush, oral mucosa moist  Cardiovascular: regular rate and rhythm, S1/S2 No murmur  Respiratory: clear to auscultation bilaterally   Abdomen: Soft, +Bowel sounds,  Tender in suprapubic area, non distended, no guarding  MSK: No LE edema, cyanosis or clubbing  Data Reviewed: Basic Metabolic Panel:  Recent Labs Lab 08/28/14 0440 08/29/14 1837 08/30/14 0515 09/02/14 0431  NA 131* 132* 133* 132*  K 3.3* 3.8 3.6 3.4*   CL 93* 92* 92* 93*  CO2 _1 GLUCOSE 77 87 84 69  BUN <5* _2 CREATININE 0.61 0.64 0.60 0.65  CALCIUM 7.4* 7.9* 7.6* 7.4*   Liver Function Tests: No results for input(s): AST, ALT, ALKPHOS, BILITOT, PROT, ALBUMIN in the last 168 hours. No results for input(s): LIPASE, AMYLASE in the last 168 hours. No results for input(s): AMMONIA in the last 168 hours. CBC:  Recent Labs Lab 08/29/14 1837 08/30/14 0515 08/31/14 0500 09/01/14 0525 09/02/14 0431  WBC 13.8* 11.6* 14.1* 13.1* 18.4*  HGB 10.2* 9.5* 8.3* 7.9* 7.9*  HCT 31.5* 29.5* 25.5* 24.4* 24.9*  MCV 95.5 95.5 95.5 95.7 95.4  PLT 231 219 242 247 291   Cardiac Enzymes: No results for input(s): CKTOTAL, CKMB, CKMBINDEX, TROPONINI in the last 168 hours. BNP (last 3 results) No results for input(s): BNP in the last 8760 hours.  ProBNP (last 3 results) No results for  input(s): PROBNP in the last 8760 hours.  CBG:  Recent Labs Lab 08/28/14 0811 08/28/14 0843 08/28/14 0903 08/28/14 0951 08/31/14 2143  GLUCAP 60* 56* 66 88 123*    Recent Results (from the past 240 hour(s))  Clostridium Difficile by PCR (not at [facility])     Status: None   Collection Time: 08/28/14  7:11 PM  Result Value Ref Range Status   C difficile by pcr NEGATIVE NEGATIVE Final     Studies: No results found.  Scheduled Meds:  Scheduled Meds: . acetaminophen  1,000 mg Oral TID  . colestipol  3 g Oral Q12H  . fluconazole  400 mg Oral Daily  . hydrALAZINE  10 mg Oral 3 times per day  . lip balm  1 application Topical BID  . mesalamine  4.8 g Oral Q breakfast  . nystatin   Topical BID  . piperacillin-tazobactam (ZOSYN)  IV  3.375 g Intravenous 3 times per day  . potassium chloride  40 mEq Oral Q4H  . psyllium  1 packet Oral BID  . saccharomyces boulardii  250 mg Oral BID  . sodium chloride  10-40 mL Intracatheter Q12H  . sodium chloride  3 mL Intravenous Q12H  . sodium chloride  3 mL Intravenous Q12H   Continuous Infusions:    Time spent on care of this patient: 35 min   [place], MD 09/03/2014, 11:46 AM  LOS: 12 days   [facility] Office  [phone] Pager - Text Page per [email or web address] If 7PM-7AM, please contact night-coverage [email or web address]

## 2014-09-03 NOTE — Progress Notes (Signed)
Cross cover [facility] Subjective: Since I last evaluated the patient, she seems to be doing much better. She had one BM this morning. Some lower abdominal cramping today but this has significantly improved in the last 2 days.  Objective: Vital signs in last 24 hours: Temp:  [98.1 F (36.7 C)-100 F (37.8 C)] 100 F (37.8 C) (07/17 0554) Pulse Rate:  [90-104] 104 (07/17 0554) Resp:  [18-20] 20 (07/17 0554) BP: (122-148)/(51-79) 138/79 mmHg (07/17 0554) SpO2:  [94 %-98 %] 96 % (07/17 0554) Last BM Date: 09/01/14  Intake/Output from previous day: 07/16 0701 - 07/17 0700 In: 230 [P.O.:120; I.V.:10; IV Piggyback:100] Out: 900 [Urine:900] Intake/Output this shift:    General appearance: alert, cooperative, fatigued and no distress Resp: clear to auscultation bilaterally Cardio: regular rate and rhythm, S1, S2 normal, no murmur, click, rub or gallop GI: soft, non-tender; bowel sounds normal; no masses,  no organomegaly  Lab Results:  Recent Labs  09/01/14 0525 09/02/14 0431  WBC 13.1* 18.4*  HGB 7.9* 7.9*  HCT 24.4* 24.9*  PLT 247 291   BMET  Recent Labs  09/02/14 0431  NA 132*  K 3.4*  CL 93*  CO2 30  GLUCOSE 69  BUN 7  CREATININE 0.65  CALCIUM 7.4*   LFT No results for input(s): PROT, ALBUMIN, AST, ALT, ALKPHOS, BILITOT, BILIDIR, IBILI in the last 72 hours. PT/INR  Recent Labs  08/31/14 1200  LABPROT 13.4  INR 1.00   Hepatitis Panel  Recent Labs  08/31/14 2020  HEPBSAG Negative  HCVAB 0.1    Medications: I have reviewed the patient's current medications.  Assessment/Plan: 1) Ulcerative colitis with a large peri-rectal abscess; will continue present care. 2) DVT-IVC filter in place.  LOS: 12 days   Jhana Giarratano 09/03/2014, 8:59 AM

## 2014-09-04 ENCOUNTER — Ambulatory Visit: Payer: BC Managed Care – PPO | Admitting: Nurse Practitioner

## 2014-09-04 LAB — BASIC METABOLIC PANEL
ANION GAP: 6 (ref 5–15)
CO2: 29 mmol/L (ref 22–32)
Calcium: 7.4 mg/dL — ABNORMAL LOW (ref 8.9–10.3)
Chloride: 95 mmol/L — ABNORMAL LOW (ref 101–111)
Creatinine, Ser: 0.53 mg/dL (ref 0.44–1.00)
GFR calc non Af Amer: >60 mL/min (ref >60)
GLUCOSE: 88 mg/dL (ref 65–99)
Potassium: 4.3 mmol/L (ref 3.5–5.1)
Sodium: 130 mmol/L — ABNORMAL LOW (ref 135–145)

## 2014-09-04 LAB — CBC
HCT: 22.9 % — ABNORMAL LOW (ref 36.0–46.0)
Hemoglobin: 7.2 g/dL — ABNORMAL LOW (ref 12.0–15.0)
MCH: 30.4 pg (ref 26.0–34.0)
MCHC: 31.4 g/dL (ref 30.0–36.0)
MCV: 96.6 fL (ref 78.0–100.0)
PLATELETS: 344 10*3/uL (ref 150–400)
RBC: 2.37 MIL/uL — ABNORMAL LOW (ref 3.87–5.11)
RDW: 16.5 % — AB (ref 11.5–15.5)
WBC: 14.4 10*3/uL — ABNORMAL HIGH (ref 4.0–10.5)

## 2014-09-04 LAB — HEMOGLOBIN AND HEMATOCRIT, BLOOD
HCT: 28.2 % — ABNORMAL LOW (ref 36.0–46.0)
Hemoglobin: 9.2 g/dL — ABNORMAL LOW (ref 12.0–15.0)

## 2014-09-04 LAB — PREPARE RBC (CROSSMATCH)

## 2014-09-04 LAB — ABO/RH: ABO/RH(D): A POS

## 2014-09-04 MED ORDER — AMLODIPINE BESYLATE 10 MG PO TABS
10.0000 mg | ORAL_TABLET | Freq: Every day | ORAL | Status: DC
  Administered 2014-09-06 – 2014-09-21 (×15): 10 mg via ORAL
  Filled 2014-09-04 (×18): qty 1

## 2014-09-04 MED ORDER — SODIUM CHLORIDE 0.9 % IV SOLN
Freq: Once | INTRAVENOUS | Status: AC
  Administered 2014-09-04: 11:00:00 via INTRAVENOUS

## 2014-09-04 MED ORDER — METHYLPREDNISOLONE SODIUM SUCC 40 MG IJ SOLR
10.0000 mg | Freq: Two times a day (BID) | INTRAMUSCULAR | Status: DC
  Administered 2014-09-04 – 2014-09-06 (×4): 10 mg via INTRAVENOUS
  Filled 2014-09-04 (×6): qty 0.25

## 2014-09-04 NOTE — Progress Notes (Signed)
Hb 12 on admission has slowly drifted down to 7.4 - will give 1 U PRBC today

## 2014-09-04 NOTE — Progress Notes (Signed)
[facility] Progress Note   [name]:[id number] DOB: [birth year] DOA: 08/22/2014 PCP: Kandice Hams, MD  Brief narrative: 74 y.o. female with past medical history of dyslipidemia, hypertension, long standing ulcerative colitis and recently hospitalized from 08/07/2014 through 08/21/2014 for ulcerative colitis flare. She had flexible sigmoidoscopy 08/08/2014 which demonstrated inflammatory changes from the rectum consistent with ulcerative colitis. She presented to [facility] long hospital with perianal pain started the night prior to the admission. CT scan on the admission demonstrated a complex perirectal infection extending up toward the retroperitoneal area.  08/21/13 underwent incision and drainage of the large issue rectal abscess on the right side. She was started on vancomycin and Zosyn. Postprocedure she was doing fine but has developed large amount of bleed on the morning of 08/23/2014. GI was consulted and pt was started on prednisone and mesalamine.  Hospital course is complicated with ongoing rectal bleed so  Additionally, she was found to have left LE DVT On venous doppler done 7/12.   Subjective: Continues to have lower abdominal pain and rectal pain.   Assessment/Plan: Sepsis secondary to peri-rectal abscess / leukocytosis  - Sepsis criteria met on the admission with tachycardia, tachypnea, hypotension, leukocytosis. Source of infection is perirectal abscess. - S/P incision and drainage by surgery 08/23/2014 - Started on vancomycin and Zosyn at the time of the admission. Abscess culture demonstrated rare yeast and multiple other organisms/ none predominant - Blood cultures to date are negative.  - Vancomycin was stopped 08/26/2014. Currently on Zosyn and fluconazole. -Dr. Megan Salon of infectious disease recommended total duration of antibiotics - 14 days- stop after evening dose on 7/18.  Bloody diarrhea due to UC  - Patient had a rectal bleed after incision and  drainage done 08/23/2014 which has continued -GI consulted and pt started on prednisone and mesalamine which was not effective in controlling diarrhea/ bleeding and therefore started on Remicaid 7/15 -He is incontinent of stool-Stool is still soiling the wound from perirectal abscess and therefore I have requested for a rectal tube to be placed - follow stool output closely- have asked RN to measure  Acute blood loss anemia  - Hb 12 on admission and 7.4 today- transfuse 1 U PRBC   Essential hypertension - SBP was elevated in 150- 160s-stop Hydralzine and add Norvasc for ease of dosing and as she is slightly tachycardic  B/L lower extremity DVT - Not a candidate for anticoagulation due to ongoing uncontrolled ulcerative colitis and  rectal bleed - therefore, an IVC filter was placed   Hypokalemia/ hyponatremia - Likely from GI losses - follow and supplement as needed   Generalized weakness  - Patient will be discharged to skilled nursing facility once medically stable.   Code Status: full code Family Communication:  Disposition Plan:  DVT prophylaxis: SCDs-  Consultants:  Gastroenterology  Surgery  Interventional radiology  Procedures:  IVC filter   PICC line  venous duplex  Antibiotics: Anti-infectives    Start     Dose/Rate Route Frequency Ordered Stop   08/28/14 0000  fluconazole (DIFLUCAN) 200 MG tablet     400 mg Oral Daily 08/28/14 1052     08/28/14 0000  amoxicillin-clavulanate (AUGMENTIN) 875-125 MG per tablet     1 tablet Oral 2 times daily 08/28/14 1052     08/25/14 2000  vancomycin (VANCOCIN) IVPB 1000 mg/200 mL premix  Status:  Discontinued     1,000 mg 200 mL/hr over 60 Minutes Intravenous Every 12 hours 08/25/14 1009 08/26/14 0949   08/25/14  1200  fluconazole (DIFLUCAN) tablet 400 mg     400 mg Oral Daily 08/25/14 0943     08/23/14 1000  vancomycin (VANCOCIN) IVPB 750 mg/150 ml premix  Status:  Discontinued     750 mg 150 mL/hr over 60 Minutes  Intravenous Every 12 hours 08/22/14 2127 08/25/14 1009   08/23/14 0600  piperacillin-tazobactam (ZOSYN) IVPB 3.375 g     3.375 g 12.5 mL/hr over 240 Minutes Intravenous 3 times per day 08/22/14 2124     08/22/14 2130  piperacillin-tazobactam (ZOSYN) IVPB 3.375 g     3.375 g 100 mL/hr over 30 Minutes Intravenous NOW 08/22/14 2124 08/22/14 2220   08/22/14 2130  [MAR Hold]  vancomycin (VANCOCIN) 2,000 mg in sodium chloride 0.9 % 500 mL IVPB     (MAR Hold since 08/22/14 2338)   2,000 mg 250 mL/hr over 120 Minutes Intravenous NOW 08/22/14 2127 08/23/14 0008      Objective: Filed Weights   08/22/14 2113 08/23/14 0153 08/23/14 0434  Weight: 99.791 kg (220 lb) 102.3 kg (225 lb 8.5 oz) 104 kg (229 lb 4.5 oz)    Intake/Output Summary (Last 24 hours) at 09/04/14 1327 Last data filed at 09/04/14 1151  Gross per 24 hour  Intake    300 ml  Output   2675 ml  Net  -2375 ml     Vitals Filed Vitals:   09/04/14 1019 09/04/14 1034 09/04/14 1220 09/04/14 1319  BP: 160/96 133/72 162/86 152/79  Pulse: 111 109 98 94  Temp: 98.6 F (37 C) 98.7 F (37.1 C) 98.4 F (36.9 C) 98.5 F (36.9 C)  TempSrc: Oral Oral Oral Oral  Resp: 20 20 20 18   Height:      Weight:      SpO2: 98% 98% 98% 97%    Exam:  General:  Pt is alert, not in acute distress  HEENT: No icterus, No thrush, oral mucosa moist  Cardiovascular: regular rate and rhythm, S1/S2 No murmur  Respiratory: clear to auscultation bilaterally   Abdomen: Soft, +Bowel sounds,  Tender in suprapubic area, non distended, no guarding  MSK: No LE edema, cyanosis or clubbing  Data Reviewed: Basic Metabolic Panel:  Recent Labs Lab 08/29/14 1837 08/30/14 0515 09/02/14 0431  NA 132* 133* 132*  K 3.8 3.6 3.4*  CL 92* 92* 93*  CO2 29 30 30   GLUCOSE 87 84 69  BUN 6 6 7   CREATININE 0.64 0.60 0.65  CALCIUM 7.9* 7.6* 7.4*   Liver Function Tests: No results for input(s): AST, ALT, ALKPHOS, BILITOT, PROT, ALBUMIN in the last 168  hours. No results for input(s): LIPASE, AMYLASE in the last 168 hours. No results for input(s): AMMONIA in the last 168 hours. CBC:  Recent Labs Lab 08/30/14 0515 08/31/14 0500 09/01/14 0525 09/02/14 0431 09/04/14 0455  WBC 11.6* 14.1* 13.1* 18.4* 14.4*  HGB 9.5* 8.3* 7.9* 7.9* 7.2*  HCT 29.5* 25.5* 24.4* 24.9* 22.9*  MCV 95.5 95.5 95.7 95.4 96.6  PLT 219 242 247 291 344   Cardiac Enzymes: No results for input(s): CKTOTAL, CKMB, CKMBINDEX, TROPONINI in the last 168 hours. BNP (last 3 results) No results for input(s): BNP in the last 8760 hours.  ProBNP (last 3 results) No results for input(s): PROBNP in the last 8760 hours.  CBG:  Recent Labs Lab 08/31/14 2143  GLUCAP 123*    Recent Results (from the past 240 hour(s))  Clostridium Difficile by PCR (not at [facility])     Status: None   Collection  Time: 08/28/14  7:11 PM  Result Value Ref Range Status   C difficile by pcr NEGATIVE NEGATIVE Final     Studies: No results found.  Scheduled Meds:  Scheduled Meds: . acetaminophen  1,000 mg Oral TID  . colestipol  3 g Oral Q12H  . fluconazole  400 mg Oral Daily  . hydrALAZINE  10 mg Oral 3 times per day  . lip balm  1 application Topical BID  . mesalamine  4.8 g Oral Q breakfast  . nystatin   Topical BID  . piperacillin-tazobactam (ZOSYN)  IV  3.375 g Intravenous 3 times per day  . psyllium  1 packet Oral BID  . saccharomyces boulardii  250 mg Oral BID  . sodium chloride  10-40 mL Intracatheter Q12H  . sodium chloride  3 mL Intravenous Q12H  . sodium chloride  3 mL Intravenous Q12H   Continuous Infusions:   Time spent on care of this patient: 35 min   [place], MD 09/04/2014, 1:27 PM  LOS: 13 days   [facility] Office  [phone] Pager - Text Page per [email or web address] If 7PM-7AM, please contact night-coverage [email or web address]

## 2014-09-04 NOTE — Progress Notes (Signed)
While changing patients dressing, it was noted that good amount of stool had come out from wound after packing had been removed.  Outside of dressing had no stool on it, so there was concern of a  possible fistula.  Coralie Keens PA with general surgery notified of concern.  Physicians assistant with Dr. Excell Seltzer, and discussed with him.  Treatment to remain the same currently.

## 2014-09-04 NOTE — Progress Notes (Signed)
09/04/14 1400 Hydrotherapy treatment.  Subjective Assessment  Subjective do what you gotta do."  Patient and Family Stated Goals I [place] this year.   Date of Onset 08/27/14  Prior Treatments Pt in with flare up of ulverative colitis with paina n diarrhea,a nd pain in perirectal area. I &D on 08/23/2014 for drainage adn opening of perirectal abscess. Penrose drains were in place earlier this week, but have been removed by surgery and hydrotherapy consult put in place.   Evaluation and Treatment  Evaluation and Treatment Procedures Explained to Patient/Family Yes  Evaluation and Treatment Procedures agreed to  [ Wound / Incision (Open or Dehisced) 08/27/14 Other (Comment) Rectum Right Right Perirectal wound (HYDRO)  Final Assessment Date/Final Assessment Time: 08/27/14 1200  Date First Assessed/Time First Assessed: 08/27/14 1100   Wound Type: Other (Comment)  Location: Rectum  Location Orientation: Right  Wound Description (Comments): Right Perirectal wound (HYDR...  Dressing Type Gauze (Comment);Moist to dry (4- 4x4's hypofix)  Dressing Changed New  Dressing Status Other (Comment) (brown, dusky, appearance of BM on old packing)  Dressing Change Frequency PRN  Site / Wound Assessment Brown;Painful;Pale (2  4x4's  in wound contaminated by loose BM inspite of flexi)  % Wound base Red or Granulating 40% (pink tissue near opening, unable to inspect deep)  % Wound base Other (Comment) 60% (unable to visualize entire wound bed)  Peri-wound Assessment Excoriated;Maceration  Wound Length (cm) 5 cm  Wound Width (cm) 8 cm  Wound Depth (cm) 5 cm  Tunneling (cm) 8 (towards body )  Margins Unattached edges (unapproximated)  Closure None  Drainage Amount Copious  Drainage Description (dark, BM contaminant)  Non-staged Wound Description Full thickness  Hydrotherapy  Pulsed lavage therapy - wound location right perirectal    Pulsed Lavage with Suction (psi) 4 psi  Pulsed Lavage with Suction - Normal Saline Used 500 mL (only used 500 )  Pulsed Lavage Tip Tunneling tip  Wound Therapy - Assess/Plan/Recommendations  Wound Therapy - Clinical Statement Pt with perirectal abscess that had an I&D on 08/23/2014 and remain open and draining. Hard to visualize wound bed due to depth and location. patient now has a flexiseal  that sits right over the I and D site, area of maceration periwound. Patient crying out in pain during cleaning of BM and during PLS. Medicated with IV med prior to treatment..Visible tissue is dusky pink. NS Kerlix packed into wound which was very painful to patient.PLS used more for washout, unable to inspect deeper wound at base.  Wound Therapy - Functional Problem List deconditioning and unhealing wound  Factors Delaying/Impairing Wound Healing Incontinence;Multiple medical problems  Hydrotherapy Plan Debridement;Dressing change;Patient/family education;Pulsatile lavage with suction  Wound Therapy - Frequency 6X / week (no hydrotherapy on Sunday, nursing to continue with dressing)  Wound Therapy - Current Recommendations PT;Surgery consult;OT  Wound Therapy - Follow Up Recommendations Skilled nursing facility  Wound Plan will continue with PLS while pt here as needed.   Wound Therapy Goals - Improve the function of patient's integumentary system by progressing the wound(s) through the phases of wound healing by:  Decrease Necrotic Tissue to 0  Decrease Necrotic Tissue - Progress Progressing toward goal  Increase Granulation Tissue to 100  Increase Granulation Tissue - Progress Progressing toward goal  Improve Drainage Characteristics Min  Improve Drainage Characteristics - Progress Progressing toward goal  Patient/Family will be able to  recognize  symptoms of progress or not and help assist with dressing changes as needed.  Goals/treatment plan/discharge plan were made with and agreed upon by  patient/family Yes  Time For Goal Achievement 2 weeks  Wound Therapy - Potential for Goals [name] PT [phone]

## 2014-09-04 NOTE — Progress Notes (Signed)
     [facility] Progress Note  Subjective:  Patient with flexi-seal with watery brown diarrhea, no blood.  Per nursing staff, less stool output.  Patient was feeling better yesterday, but complaining of abdominal pain today.  Says that she has good days and bad days.  Objective:  Vital signs in last 24 hours: Temp:  [98.3 F (36.8 C)-99 F (37.2 C)] 98.3 F (36.8 C) (07/18 0527) Pulse Rate:  [100-111] 107 (07/18 0527) Resp:  [18-20] 20 (07/18 0527) BP: (131-142)/(62-76) 131/62 mmHg (07/18 0527) SpO2:  [97 %-100 %] 98 % (07/18 0527) Last BM Date: 09/03/14 General:  Alert, well-developed, in NAD but somewhat uncomfortable Heart:  Tachy.  No murmurs noted. Pulm:  CTAB.  No W/R/R. Abdomen:  Soft, non-distended.  Normal bowel sounds.  Mild diffuse TTP without R/R/G. Extremities:  Without edema. Neurologic:  Alert and  oriented x4;  grossly normal neurologically. Psych:  Alert and cooperative. Normal mood and affect.  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  Some improvement. 2) Perirectal abscess-POD #13 from I&D.  On Zosyn per ID recs.   3) B/L LE DVT's-IVC filter in place. 4) Acute blood loss anemia:  Receiving one unit PRBC's today for Hgb of 7.2 grams.   LOS: 13 days   ZEHR, JESSICA D.  09/04/2014, 9:10 AM  Pager number [phone] Attending MD note:   I have taken a history, examined the patient, and reviewed the chart. I agree with the Advanced Practitioner's impression and  recommendations. I wonder if we could add small dose of steroids to tie her over till Remicade induction regimen completed. She is still passing blood ,but having less diarrhea. I suggest Solumedrol 10mg  IV q 12 hours .Will nor order unless OK with surgery  [facility] Pager # [phone]

## 2014-09-04 NOTE — Progress Notes (Signed)
Date:  September 04, 2014 U.R. performed for needs and level of care. Multiple post surgical problems with wound/hgb down to 7.2 bld products given on [id number] Will continue to follow for Case Management needs.  Velva Harman, RN, BSN, [place]   [phone]

## 2014-09-04 NOTE — Progress Notes (Signed)
[facility] Progress Note  13 Days Post-Op  Subjective: Having abdominal cramping/discomfort.  Lots of flatus, unsure if she's having bloody BM's, but was apparent on exam.  Feels nauseated and bloated.    Objective: Vital signs in last 24 hours: Temp:  [98.3 F (36.8 C)-99 F (37.2 C)] 98.3 F (36.8 C) (07/18 0527) Pulse Rate:  [100-111] 107 (07/18 0527) Resp:  [18-20] 20 (07/18 0527) BP: (131-142)/(62-76) 131/62 mmHg (07/18 0527) SpO2:  [97 %-100 %] 98 % (07/18 0527) Last BM Date: 09/03/14  Intake/Output from previous day: 07/17 0701 - 07/18 0700 In: -  Out: 6761 [Urine:1775] Intake/Output this shift:    PE: Gen:  Alert, NAD, pleasant Abd: Soft, some distension and tenderness, +BS, no HSM Perirectal: right open wound with frank stool contamination and packing has fallen out, Pools of liquid brown stool in wound.  Peri-wound without induration or fluctuance, no purulent drainage.  Wound goes 6-8cm deep superiorly and inferiorly.  Tolerates hydro, but tender with palpation and re-packing.   09/04/14    Lab Results:   Recent Labs  09/02/14 0431 09/04/14 0455  WBC 18.4* 14.4*  HGB 7.9* 7.2*  HCT 24.9* 22.9*  PLT 291 344   BMET  Recent Labs  09/02/14 0431  NA 132*  K 3.4*  CL 93*  CO2 30  GLUCOSE 69  BUN 7  CREATININE 0.65  CALCIUM 7.4*   PT/INR No results for input(s): LABPROT, INR in the last 72 hours. CMP     Component Value Date/Time   NA 132* 09/02/2014 0431   K 3.4* 09/02/2014 0431   CL 93* 09/02/2014 0431   CO2 30 09/02/2014 0431   GLUCOSE 69 09/02/2014 0431   BUN 7 09/02/2014 0431   CREATININE 0.65 09/02/2014 0431   CREATININE 1.08 03/28/2014 1022   CALCIUM 7.4* 09/02/2014 0431   PROT 4.9* 08/23/2014 0345   ALBUMIN 2.0* 08/23/2014 0345   AST 15 08/23/2014 0345   ALT 13* 08/23/2014 0345   ALKPHOS 46 08/23/2014 0345   BILITOT 0.7 08/23/2014 0345   GFRNONAA >60 09/02/2014 0431   GFRNONAA 50* 03/28/2014 1022   GFRAA >60  09/02/2014 0431   GFRAA 58* 03/28/2014 1022   Lipase     Component Value Date/Time   LIPASE 13* 08/07/2014 1201       Studies/Results: No results found.  Anti-infectives: Anti-infectives    Start     Dose/Rate Route Frequency Ordered Stop   08/28/14 0000  fluconazole (DIFLUCAN) 200 MG tablet     400 mg Oral Daily 08/28/14 1052     08/28/14 0000  amoxicillin-clavulanate (AUGMENTIN) 875-125 MG per tablet     1 tablet Oral 2 times daily 08/28/14 1052     08/25/14 2000  vancomycin (VANCOCIN) IVPB 1000 mg/200 mL premix  Status:  Discontinued     1,000 mg 200 mL/hr over 60 Minutes Intravenous Every 12 hours 08/25/14 1009 08/26/14 0949   08/25/14 1200  fluconazole (DIFLUCAN) tablet 400 mg     400 mg Oral Daily 08/25/14 0943     08/23/14 1000  vancomycin (VANCOCIN) IVPB 750 mg/150 ml premix  Status:  Discontinued     750 mg 150 mL/hr over 60 Minutes Intravenous Every 12 hours 08/22/14 2127 08/25/14 1009   08/23/14 0600  piperacillin-tazobactam (ZOSYN) IVPB 3.375 g     3.375 g 12.5 mL/hr over 240 Minutes Intravenous 3 times per day 08/22/14 2124     08/22/14 2130  piperacillin-tazobactam (ZOSYN) IVPB 3.375 g  3.375 g 100 mL/hr over 30 Minutes Intravenous NOW 08/22/14 2124 08/22/14 2220   08/22/14 2130  [MAR Hold]  vancomycin (VANCOCIN) 2,000 mg in sodium chloride 0.9 % 500 mL IVPB     (MAR Hold since 08/22/14 2338)   2,000 mg 250 mL/hr over 120 Minutes Intravenous NOW 08/22/14 2127 08/23/14 0008       Assessment/Plan Ulcerative colitis -Per GI started on Remicade, still having diarrhea and bleeding and abdominal cramping POD#13 I&D perirectal abscess---Dr. Zella Richer -Updated wound care orders.  Continue with hydro and QID wet to dry dressing changes (with kerlix gauze) and sitz baths as able -Leukocytosis improving -Mobilize, turning to avoid further skin breakdown, IS -HH diet -GI wanted to ask if we thought she could start steroids at this point, discussed with Dr.  Excell Seltzer, it's not ideal, but given the wound is clean we can proceed at a lower dose to see if this helps with her bloody diarrhea. ABL Anemia -Given 1 unit of pRBC 09/03/14 ID-zosyn D 14/14 per ID recs.  New DVT s/p IVC filter  -Holding pharm DVT proph due to anemia, but on SCD's     LOS: 13 days    Nat Christen 09/04/2014, 7:59 AM Pager: [phone]

## 2014-09-05 DIAGNOSIS — R159 Full incontinence of feces: Secondary | ICD-10-CM

## 2014-09-05 DIAGNOSIS — K51913 Ulcerative colitis, unspecified with fistula: Secondary | ICD-10-CM

## 2014-09-05 LAB — CBC
HCT: 28.3 % — ABNORMAL LOW (ref 36.0–46.0)
Hemoglobin: 9.3 g/dL — ABNORMAL LOW (ref 12.0–15.0)
MCH: 30.5 pg (ref 26.0–34.0)
MCHC: 32.9 g/dL (ref 30.0–36.0)
MCV: 92.8 fL (ref 78.0–100.0)
Platelets: 377 10*3/uL (ref 150–400)
RBC: 3.05 MIL/uL — AB (ref 3.87–5.11)
RDW: 17.2 % — AB (ref 11.5–15.5)
WBC: 14.6 10*3/uL — AB (ref 4.0–10.5)

## 2014-09-05 LAB — BASIC METABOLIC PANEL
Anion gap: 7 (ref 5–15)
BUN: 6 mg/dL (ref 6–20)
CHLORIDE: 94 mmol/L — AB (ref 101–111)
CO2: 26 mmol/L (ref 22–32)
CREATININE: 0.56 mg/dL (ref 0.44–1.00)
Calcium: 7.6 mg/dL — ABNORMAL LOW (ref 8.9–10.3)
GFR calc Af Amer: >60 mL/min (ref >60)
GLUCOSE: 97 mg/dL (ref 65–99)
POTASSIUM: 4.3 mmol/L (ref 3.5–5.1)
SODIUM: 127 mmol/L — AB (ref 135–145)

## 2014-09-05 LAB — TYPE AND SCREEN
ABO/RH(D): A POS
Antibody Screen: NEGATIVE
Unit division: 0

## 2014-09-05 MED ORDER — SODIUM CHLORIDE 0.9 % IV SOLN
INTRAVENOUS | Status: DC
  Administered 2014-09-05: 08:00:00 via INTRAVENOUS

## 2014-09-05 MED ORDER — METRONIDAZOLE 250 MG PO TABS
250.0000 mg | ORAL_TABLET | Freq: Three times a day (TID) | ORAL | Status: DC
  Administered 2014-09-05 – 2014-09-06 (×4): 250 mg via ORAL
  Filled 2014-09-05 (×6): qty 1

## 2014-09-05 MED ORDER — METOPROLOL SUCCINATE ER 25 MG PO TB24
25.0000 mg | ORAL_TABLET | Freq: Every day | ORAL | Status: DC
  Administered 2014-09-05 – 2014-09-06 (×2): 25 mg via ORAL
  Filled 2014-09-05 (×2): qty 1

## 2014-09-05 NOTE — Progress Notes (Signed)
[facility] Progress Note   [name] EQA:[id number] DOB: 06/26/1940 DOA: 08/22/2014 PCP: Kandice Hams, MD  Brief narrative: 74 y.o. female with past medical history of dyslipidemia, hypertension, long standing ulcerative colitis and recently hospitalized from 08/07/2014 through 08/21/2014 for ulcerative colitis flare. She had flexible sigmoidoscopy 08/08/2014 which demonstrated inflammatory changes from the rectum consistent with ulcerative colitis. She presented to [facility] with perianal pain started the night prior to the admission. CT scan on the admission demonstrated a complex perirectal infection extending up toward the retroperitoneal area.  08/21/13 underwent incision and drainage of the large issue rectal abscess on the right side. She was started on vancomycin and Zosyn. Postprocedure she was doing fine but has developed large amount of bleed on the morning of 08/23/2014. GI was consulted and pt was started on prednisone and mesalamine.  Hospital course is complicated with ongoing rectal bleed so  Additionally, she was found to have left LE DVT On venous doppler done 7/12.   Subjective: Unchanged intermittant lower abdominal pain and rectal pain.   Assessment/Plan: Sepsis secondary to peri-rectal abscess / leukocytosis  - Sepsis criteria met on the admission with tachycardia, tachypnea, hypotension, leukocytosis. Source of infection is perirectal abscess. - S/P incision and drainage by surgery 08/23/2014 - Started on vancomycin and Zosyn at the time of the admission. Abscess culture demonstrated rare yeast and multiple other organisms/ none predominant - Blood cultures to date are negative.  - Vancomycin was stopped 08/26/2014. Currently on Zosyn and fluconazole. -Dr. Megan Salon of infectious disease recommended total duration of antibiotics - 14 days - stop date was 7/18- but due to stool in abscess cavity and possible fistula, I will not stop antibiotics yet-  have asked Dr Excell Seltzer today to help decide if we need to continue Zosyn.  Bloody diarrhea due to UC  - Patient had a rectal bleed after incision and drainage done 08/23/2014 which has continued -GI consulted and pt started on prednisone and mesalamine which was not effective in controlling diarrhea/ bleeding and therefore started on Remicaid 7/15 - placed on IV steroids yesterday  - see above in regards to fistula- only had 275 cc of stool through rectal tube yesterday   Acute blood loss anemia  - Hb 12 on admission and 7.2 on 7/18- transfused 1 U PRBC =- Hb now 9.3 but also dehydrated today- will be staring IVF today which may cause hemodilution   Essential hypertension - SBP was elevated in 150- 160s-stopped Hydralzine and added Norvasc for ease of dosing and as she was slightly tachycardic- will add toprol 52m - was on Lopressor at home  B/L lower extremity DVT - Not a candidate for anticoagulation due to ongoing uncontrolled ulcerative colitis and  rectal bleed - therefore, an IVC filter was placed   Hypokalemia/ hyponatremia - Likely from GI losses - sodium low today- resumed IVF today   Generalized weakness  - Patient will be discharged to skilled nursing facility once medically stable.   Code Status: full code Family Communication:  Disposition Plan:  DVT prophylaxis: SCDs-  Consultants:  Gastroenterology  Surgery  Interventional radiology  Procedures:  IVC filter   PICC line  venous duplex  Antibiotics: Anti-infectives    Start     Dose/Rate Route Frequency Ordered Stop   09/05/14 1500  metroNIDAZOLE (FLAGYL) tablet 250 mg     250 mg Oral 3 times per day 09/05/14 1440     08/28/14 0000  fluconazole (DIFLUCAN) 200 MG tablet  400 mg Oral Daily 08/28/14 1052     08/28/14 0000  amoxicillin-clavulanate (AUGMENTIN) 875-125 MG per tablet     1 tablet Oral 2 times daily 08/28/14 1052     08/25/14 2000  vancomycin (VANCOCIN) IVPB 1000 mg/200 mL premix   Status:  Discontinued     1,000 mg 200 mL/hr over 60 Minutes Intravenous Every 12 hours 08/25/14 1009 08/26/14 0949   08/25/14 1200  fluconazole (DIFLUCAN) tablet 400 mg     400 mg Oral Daily 08/25/14 0943     08/23/14 1000  vancomycin (VANCOCIN) IVPB 750 mg/150 ml premix  Status:  Discontinued     750 mg 150 mL/hr over 60 Minutes Intravenous Every 12 hours 08/22/14 2127 08/25/14 1009   08/23/14 0600  piperacillin-tazobactam (ZOSYN) IVPB 3.375 g  Status:  Discontinued     3.375 g 12.5 mL/hr over 240 Minutes Intravenous 3 times per day 08/22/14 2124 09/05/14 1356   08/22/14 2130  piperacillin-tazobactam (ZOSYN) IVPB 3.375 g     3.375 g 100 mL/hr over 30 Minutes Intravenous NOW 08/22/14 2124 08/22/14 2220   08/22/14 2130  [MAR Hold]  vancomycin (VANCOCIN) 2,000 mg in sodium chloride 0.9 % 500 mL IVPB     (MAR Hold since 08/22/14 2338)   2,000 mg 250 mL/hr over 120 Minutes Intravenous NOW 08/22/14 2127 08/23/14 0008      Objective: Filed Weights   08/22/14 2113 08/23/14 0153 08/23/14 0434  Weight: 99.791 kg (220 lb) 102.3 kg (225 lb 8.5 oz) 104 kg (229 lb 4.5 oz)    Intake/Output Summary (Last 24 hours) at 09/05/14 1506 Last data filed at 09/05/14 0615  Gross per 24 hour  Intake      0 ml  Output   1775 ml  Net  -1775 ml     Vitals Filed Vitals:   09/04/14 1220 09/04/14 1319 09/04/14 2159 09/05/14 0718  BP: 162/86 152/79 154/77 156/72  Pulse: 98 94 90 92  Temp: 98.4 F (36.9 C) 98.5 F (36.9 C) 98.4 F (36.9 C) 98.2 F (36.8 C)  TempSrc: Oral Oral Oral Oral  Resp: _0 Height:      Weight:      SpO2: 98% 97% 100%     Exam:  General:  Pt is alert, not in acute distress  HEENT: No icterus, No thrush, oral mucosa moist  Cardiovascular: regular rate and rhythm, S1/S2 No murmur  Respiratory: clear to auscultation bilaterally   Abdomen: Soft, +Bowel sounds,  Tender in suprapubic area, non distended, no guarding  MSK: No LE edema, cyanosis or  clubbing  Data Reviewed: Basic Metabolic Panel:  Recent Labs Lab 08/29/14 1837 08/30/14 0515 09/02/14 0431 09/04/14 1359 09/05/14 0445  NA 132* 133* 132* 130* 127*  K 3.8 3.6 3.4* 4.3 4.3  CL 92* 92* 93* 95* 94*  CO2 _1 GLUCOSE 87 84 69 88 97  BUN _2 <5* 6  CREATININE 0.64 0.60 0.65 0.53 0.56  CALCIUM 7.9* 7.6* 7.4* 7.4* 7.6*   Liver Function Tests: No results for input(s): AST, ALT, ALKPHOS, BILITOT, PROT, ALBUMIN in the last 168 hours. No results for input(s): LIPASE, AMYLASE in the last 168 hours. No results for input(s): AMMONIA in the last 168 hours. CBC:  Recent Labs Lab 08/31/14 0500 09/01/14 0525 09/02/14 0431 09/04/14 0455 09/04/14 1617 09/05/14 0445  WBC 14.1* 13.1* 18.4* 14.4*  --  14.6*  HGB 8.3* 7.9* 7.9* 7.2* 9.2* 9.3*  HCT  25.5* 24.4* 24.9* 22.9* 28.2* 28.3*  MCV 95.5 95.7 95.4 96.6  --  92.8  PLT 242 247 291 344  --  377   Cardiac Enzymes: No results for input(s): CKTOTAL, CKMB, CKMBINDEX, TROPONINI in the last 168 hours. BNP (last 3 results) No results for input(s): BNP in the last 8760 hours.  ProBNP (last 3 results) No results for input(s): PROBNP in the last 8760 hours.  CBG:  Recent Labs Lab 08/31/14 2143  GLUCAP 123*    Recent Results (from the past 240 hour(s))  Clostridium Difficile by PCR (not at [facility])     Status: None   Collection Time: 08/28/14  7:11 PM  Result Value Ref Range Status   C difficile by pcr NEGATIVE NEGATIVE Final     Studies: No results found.  Scheduled Meds:  Scheduled Meds: . acetaminophen  1,000 mg Oral TID  . amLODipine  10 mg Oral Daily  . colestipol  3 g Oral Q12H  . fluconazole  400 mg Oral Daily  . lip balm  1 application Topical BID  . mesalamine  4.8 g Oral Q breakfast  . methylPREDNISolone (SOLU-MEDROL) injection  10 mg Intravenous Q12H  . metroNIDAZOLE  250 mg Oral 3 times per day  . nystatin   Topical BID  . psyllium  1 packet Oral BID  . saccharomyces boulardii  250  mg Oral BID  . sodium chloride  10-40 mL Intracatheter Q12H  . sodium chloride  3 mL Intravenous Q12H  . sodium chloride  3 mL Intravenous Q12H   Continuous Infusions: . sodium chloride 100 mL/hr at 09/05/14 0817    Time spent on care of this patient: 35 min   [place], MD 09/05/2014, 3:06 PM  LOS: 14 days   [facility] Office  [phone] Pager - Text Page per [email or web address] If 7PM-7AM, please contact night-coverage [email or web address]

## 2014-09-05 NOTE — Progress Notes (Signed)
[facility] Progress Note  14 Days Post-Op  Subjective: Pt still sore on her backside.  Painful dressing changes.  No N/V, tolerating diet.  Stools still with some blood in it, but mostly brown.  Nurse reported outer dressing was clean and there was stool pouring from wound yesterday.  Still performing frequent dressing changes.    Objective: Vital signs in last 24 hours: Temp:  [98.2 F (36.8 C)-98.7 F (37.1 C)] 98.2 F (36.8 C) (07/19 0718) Pulse Rate:  [90-111] 92 (07/19 0718) Resp:  [16-20] 16 (07/19 0718) BP: (133-162)/(72-96) 156/72 mmHg (07/19 0718) SpO2:  [97 %-100 %] 100 % (07/18 2159) Last BM Date: 09/04/14  Intake/Output from previous day: 07/18 0701 - 07/19 0700 In: 300 [Blood:300] Out: 2675 [Urine:2400; Stool:275] Intake/Output this shift:    PE: Gen:  Alert, NAD, pleasant Abd: Soft, NT/ND, +BS, no HSM   Lab Results:   Recent Labs  09/04/14 0455 09/04/14 1617 09/05/14 0445  WBC 14.4*  --  14.6*  HGB 7.2* 9.2* 9.3*  HCT 22.9* 28.2* 28.3*  PLT 344  --  377   BMET  Recent Labs  09/04/14 1359 09/05/14 0445  NA 130* 127*  K 4.3 4.3  CL 95* 94*  CO2 29 26  GLUCOSE 88 97  BUN <5* 6  CREATININE 0.53 0.56  CALCIUM 7.4* 7.6*   PT/INR No results for input(s): LABPROT, INR in the last 72 hours. CMP     Component Value Date/Time   NA 127* 09/05/2014 0445   K 4.3 09/05/2014 0445   CL 94* 09/05/2014 0445   CO2 26 09/05/2014 0445   GLUCOSE 97 09/05/2014 0445   BUN 6 09/05/2014 0445   CREATININE 0.56 09/05/2014 0445   CREATININE 1.08 03/28/2014 1022   CALCIUM 7.6* 09/05/2014 0445   PROT 4.9* 08/23/2014 0345   ALBUMIN 2.0* 08/23/2014 0345   AST 15 08/23/2014 0345   ALT 13* 08/23/2014 0345   ALKPHOS 46 08/23/2014 0345   BILITOT 0.7 08/23/2014 0345   GFRNONAA >60 09/05/2014 0445   GFRNONAA 50* 03/28/2014 1022   GFRAA >60 09/05/2014 0445   GFRAA 58* 03/28/2014 1022   Lipase     Component Value Date/Time   LIPASE 13*  08/07/2014 1201       Studies/Results: No results found.  Anti-infectives: Anti-infectives    Start     Dose/Rate Route Frequency Ordered Stop   08/28/14 0000  fluconazole (DIFLUCAN) 200 MG tablet     400 mg Oral Daily 08/28/14 1052     08/28/14 0000  amoxicillin-clavulanate (AUGMENTIN) 875-125 MG per tablet     1 tablet Oral 2 times daily 08/28/14 1052     08/25/14 2000  vancomycin (VANCOCIN) IVPB 1000 mg/200 mL premix  Status:  Discontinued     1,000 mg 200 mL/hr over 60 Minutes Intravenous Every 12 hours 08/25/14 1009 08/26/14 0949   08/25/14 1200  fluconazole (DIFLUCAN) tablet 400 mg     400 mg Oral Daily 08/25/14 0943     08/23/14 1000  vancomycin (VANCOCIN) IVPB 750 mg/150 ml premix  Status:  Discontinued     750 mg 150 mL/hr over 60 Minutes Intravenous Every 12 hours 08/22/14 2127 08/25/14 1009   08/23/14 0600  piperacillin-tazobactam (ZOSYN) IVPB 3.375 g     3.375 g 12.5 mL/hr over 240 Minutes Intravenous 3 times per day 08/22/14 2124     08/22/14 2130  piperacillin-tazobactam (ZOSYN) IVPB 3.375 g     3.375 g 100 mL/hr over 30  Minutes Intravenous NOW 08/22/14 2124 08/22/14 2220   08/22/14 2130  [MAR Hold]  vancomycin (VANCOCIN) 2,000 mg in sodium chloride 0.9 % 500 mL IVPB     (MAR Hold since 08/22/14 2338)   2,000 mg 250 mL/hr over 120 Minutes Intravenous NOW 08/22/14 2127 08/23/14 0008       Assessment/Plan Ulcerative colitis -Per GI started on Remicade and now steroids, still having diarrhea and bleeding POD#14 I&D perirectal abscess---Dr. Zella Richer Concerned for fistula -Patient continues to have loose stools and there is concern its coming from her wound (fistula from colon?) -Dr. Excell Seltzer and Dr. Marcello Moores are pending a discussion, but may require diverting ostomy -Continue with hydro and QID wet to dry dressing changes (with kerlix gauze) and irrigation of wound -Ordered air mattress -Leukocytosis 14.6 today -Mobilize, turning to avoid further skin  breakdown, IS -HH diet -We agreed with GI that a trial of steroids while not ideal may be beneficial at this point ABL Anemia -Given 1 unit of pRBC 09/03/14 ID-zosyn D 14/14 per ID recs.  New DVT s/p IVC filter  -Holding pharm DVT proph due to anemia, but on SCD's    LOS: 14 days    Nat Christen 09/05/2014, 10:05 AM Pager: [phone]

## 2014-09-05 NOTE — Progress Notes (Signed)
PT Cancellation Note  Patient Details Name: LATICHA FERRUCCI MRN: [id number] DOB: 06/02/1940   Cancelled Treatment:    Reason Eval/Treat Not Completed: Medical issues which prohibited therapy (possibly has a fistula, BM in wound.  PLS has been discontinued. will see  what plan is and if mobility is indicated .)   [name] 09/05/2014, 4:51 PM Tresa Endo PT [phone]

## 2014-09-05 NOTE — Progress Notes (Signed)
     [facility] Progress Note  Subjective:  Feels and looks better today, but there is now concern of a fistula associated with the peri-rectal abscess wound.  Liquid brown stool with possible small amount of blood noted in the flexi-seal bag.  Objective:  Vital signs in last 24 hours: Temp:  [98.2 F (36.8 C)-98.7 F (37.1 C)] 98.2 F (36.8 C) (07/19 0718) Pulse Rate:  [90-111] 92 (07/19 0718) Resp:  [16-20] 16 (07/19 0718) BP: (133-162)/(72-96) 156/72 mmHg (07/19 0718) SpO2:  [97 %-100 %] 100 % (07/18 2159) Last BM Date: 09/04/14 General:  Alert, Well-developed, in NAD Heart:  Regular rate and rhythm; no murmurs Pulm:  CTAB.  No W/R/R. Abdomen:  Soft, non-distended.  BS present.  Non-tender. Extremities:  Without edema. Neurologic:  Alert and  oriented x4;  grossly normal neurologically. Psych:  Alert and cooperative. Normal mood and affect.  Intake/Output from previous day: 07/18 0701 - 07/19 0700 In: 300 [Blood:300] Out: 2675 [Urine:2400; Stool:275]  Lab Results:  Recent Labs  09/04/14 0455 09/04/14 1617 09/05/14 0445  WBC 14.4*  --  14.6*  HGB 7.2* 9.2* 9.3*  HCT 22.9* 28.2* 28.3*  PLT 344  --  377   BMET  Recent Labs  09/04/14 1359 09/05/14 0445  NA 130* 127*  K 4.3 4.3  CL 95* 94*  CO2 29 26  GLUCOSE 88 97  BUN <5* 6  CREATININE 0.53 0.56  CALCIUM 7.4* 7.6*   Assessment / Plan: 1) IBD (UC) with acute activation of colitis:  Has received her first dose of Remicade on 7/15 and is on Lialda 4.8 grams daily. Solumedrol 10 mg IV BID added yesterday.  Some improvement. 2) Perirectal abscess-POD #14 from I&D. On Zosyn per ID recs.  Now large concern for fistula; surgery to discuss with Dr. Marcello Moores regarding surgical intervention such as diverting loop, etc.  3) B/L LE DVT's-IVC filter in place. 4) Acute blood loss anemia: Received one unit PRBC's yesterday for Hgb of 7.2 grams.  Improved today at 9.3 grams.   LOS: 14 days   ZEHR,  JESSICA D.  09/05/2014, 9:07 AM  Pager number [phone]  Attending MD note:   I have taken a history, examined the patient, and reviewed the chart. I agree with the Advanced Practitioner's impression and recommendations. She had a flex sigmoidoscopy on 08/08/2014 by Dr  Carlean Purl- severe colitis 0-40 cm, mild colitis 40-70 cm. Consider repeat flex sigmoidoscopy to see if disease has retreated to sigmoid colon so the colostomy could be done using a healthy colon. Flagyl added to the regimen.  I wonder if Crohn's disease present here  in view of ? Fistula/asbcess  .  [facility] Pager # [phone]

## 2014-09-05 NOTE — Progress Notes (Signed)
Clinical Social Work  CSW continues to follow patient to assist with DC planning. CSW will assist with SNF placement once medical plan decided for needs at DC.  [place], [place] [phone]

## 2014-09-06 ENCOUNTER — Encounter (HOSPITAL_COMMUNITY): Payer: Self-pay

## 2014-09-06 ENCOUNTER — Encounter (HOSPITAL_COMMUNITY)
Admission: EM | Disposition: A | Discharge status: Skilled Nursing Facility | Payer: Self-pay | Source: Home / Self Care | Attending: Internal Medicine

## 2014-09-06 DIAGNOSIS — J00 Acute nasopharyngitis [common cold]: Secondary | ICD-10-CM

## 2014-09-06 DIAGNOSIS — IMO0001 Reserved for inherently not codable concepts without codable children: Secondary | ICD-10-CM | POA: Insufficient documentation

## 2014-09-06 HISTORY — PX: FLEXIBLE SIGMOIDOSCOPY: SHX5431

## 2014-09-06 LAB — GLUCOSE, CAPILLARY
GLUCOSE-CAPILLARY: 116 mg/dL — AB (ref 65–99)
GLUCOSE-CAPILLARY: 135 mg/dL — AB (ref 65–99)
Glucose-Capillary: 174 mg/dL — ABNORMAL HIGH (ref 65–99)

## 2014-09-06 LAB — BASIC METABOLIC PANEL
ANION GAP: 4 — AB (ref 5–15)
BUN: 11 mg/dL (ref 6–20)
CO2: 27 mmol/L (ref 22–32)
Calcium: 7.7 mg/dL — ABNORMAL LOW (ref 8.9–10.3)
Chloride: 96 mmol/L — ABNORMAL LOW (ref 101–111)
Creatinine, Ser: 0.4 mg/dL — ABNORMAL LOW (ref 0.44–1.00)
GLUCOSE: 140 mg/dL — AB (ref 65–99)
Potassium: 4.4 mmol/L (ref 3.5–5.1)
SODIUM: 127 mmol/L — AB (ref 135–145)

## 2014-09-06 LAB — CBC
HCT: 27.2 % — ABNORMAL LOW (ref 36.0–46.0)
Hemoglobin: 8.8 g/dL — ABNORMAL LOW (ref 12.0–15.0)
MCH: 30.3 pg (ref 26.0–34.0)
MCHC: 32.4 g/dL (ref 30.0–36.0)
MCV: 93.8 fL (ref 78.0–100.0)
PLATELETS: 420 10*3/uL — AB (ref 150–400)
RBC: 2.9 MIL/uL — AB (ref 3.87–5.11)
RDW: 17 % — ABNORMAL HIGH (ref 11.5–15.5)
WBC: 14.9 10*3/uL — ABNORMAL HIGH (ref 4.0–10.5)

## 2014-09-06 LAB — PHOSPHORUS: Phosphorus: 2.6 mg/dL (ref 2.5–4.6)

## 2014-09-06 LAB — MAGNESIUM: Magnesium: 1.4 mg/dL — ABNORMAL LOW (ref 1.7–2.4)

## 2014-09-06 SURGERY — SIGMOIDOSCOPY, FLEXIBLE
Anesthesia: Moderate Sedation

## 2014-09-06 MED ORDER — FENTANYL CITRATE (PF) 100 MCG/2ML IJ SOLN
INTRAMUSCULAR | Status: DC | PRN
  Administered 2014-09-06 (×2): 12.5 ug via INTRAVENOUS
  Administered 2014-09-06: 25 ug via INTRAVENOUS

## 2014-09-06 MED ORDER — SODIUM CHLORIDE 0.9 % IV SOLN
INTRAVENOUS | Status: AC
  Administered 2014-09-07: 09:00:00 via INTRAVENOUS

## 2014-09-06 MED ORDER — ACETAMINOPHEN 650 MG RE SUPP: 650.0000 mg | Freq: Four times a day (QID) | RECTAL | Status: DC

## 2014-09-06 MED ORDER — FLUCONAZOLE IN SODIUM CHLORIDE 400-0.9 MG/200ML-% IV SOLN
400.0000 mg | INTRAVENOUS | Status: DC
  Administered 2014-09-07 – 2014-09-20 (×14): 400 mg via INTRAVENOUS
  Filled 2014-09-06 (×17): qty 200

## 2014-09-06 MED ORDER — MAGNESIUM SULFATE 2 GM/50ML IV SOLN
2.0000 g | Freq: Once | INTRAVENOUS | Status: AC
Start: 2014-09-06 — End: 2014-09-06
  Administered 2014-09-06: 2 g via INTRAVENOUS
  Filled 2014-09-06: qty 50

## 2014-09-06 MED ORDER — MAGNESIUM CITRATE PO SOLN
1.0000 | ORAL | Status: AC
  Administered 2014-09-06: 1 via ORAL

## 2014-09-06 MED ORDER — METOPROLOL TARTRATE 1 MG/ML IV SOLN
2.5000 mg | Freq: Four times a day (QID) | INTRAVENOUS | Status: DC
  Administered 2014-09-07 – 2014-09-15 (×31): 2.5 mg via INTRAVENOUS
  Filled 2014-09-06 (×38): qty 5

## 2014-09-06 MED ORDER — METOPROLOL TARTRATE 1 MG/ML IV SOLN: 2.5000 mg | Freq: Four times a day (QID) | INTRAVENOUS | Status: DC

## 2014-09-06 MED ORDER — INSULIN ASPART 100 UNIT/ML ~~LOC~~ SOLN
0.0000 [IU] | SUBCUTANEOUS | Status: DC
  Administered 2014-09-06: 3 [IU] via SUBCUTANEOUS
  Administered 2014-09-07: 2 [IU] via SUBCUTANEOUS
  Administered 2014-09-07 (×2): 3 [IU] via SUBCUTANEOUS
  Administered 2014-09-07 (×2): 2 [IU] via SUBCUTANEOUS
  Administered 2014-09-08 – 2014-09-09 (×11): 3 [IU] via SUBCUTANEOUS
  Administered 2014-09-10: 5 [IU] via SUBCUTANEOUS
  Administered 2014-09-10: 3 [IU] via SUBCUTANEOUS
  Administered 2014-09-10: 5 [IU] via SUBCUTANEOUS

## 2014-09-06 MED ORDER — TRACE MINERALS CR-CU-MN-SE-ZN 10-1000-500-60 MCG/ML IV SOLN
INTRAVENOUS | Status: AC
  Administered 2014-09-06: 17:00:00 via INTRAVENOUS
  Filled 2014-09-06: qty 960

## 2014-09-06 MED ORDER — ACETAMINOPHEN 650 MG RE SUPP: 975.0000 mg | Freq: Three times a day (TID) | RECTAL | Status: DC

## 2014-09-06 MED ORDER — METHYLPREDNISOLONE SODIUM SUCC 40 MG IJ SOLR
20.0000 mg | Freq: Three times a day (TID) | INTRAMUSCULAR | Status: DC
  Administered 2014-09-06 – 2014-09-11 (×14): 20 mg via INTRAVENOUS
  Filled 2014-09-06 (×17): qty 0.5

## 2014-09-06 MED ORDER — METRONIDAZOLE IVPB CUSTOM
250.0000 mg | Freq: Three times a day (TID) | INTRAVENOUS | Status: DC
  Administered 2014-09-06 – 2014-09-12 (×17): 250 mg via INTRAVENOUS
  Filled 2014-09-06 (×18): qty 50

## 2014-09-06 MED ORDER — DIPHENHYDRAMINE HCL 50 MG/ML IJ SOLN
INTRAMUSCULAR | Status: AC
Start: 2014-09-06 — End: 2014-09-06
  Filled 2014-09-06: qty 1

## 2014-09-06 MED ORDER — SODIUM CHLORIDE 0.9 % IV SOLN
INTRAVENOUS | Status: AC
  Administered 2014-09-06: 15:00:00 via INTRAVENOUS

## 2014-09-06 MED ORDER — MIDAZOLAM HCL 5 MG/ML IJ SOLN
INTRAMUSCULAR | Status: AC
  Filled 2014-09-06: qty 2

## 2014-09-06 MED ORDER — FENTANYL CITRATE (PF) 100 MCG/2ML IJ SOLN
INTRAMUSCULAR | Status: AC
  Filled 2014-09-06: qty 2

## 2014-09-06 MED ORDER — MIDAZOLAM HCL 10 MG/2ML IJ SOLN
INTRAMUSCULAR | Status: DC | PRN
  Administered 2014-09-06 (×2): 1 mg via INTRAVENOUS
  Administered 2014-09-06: 2 mg via INTRAVENOUS

## 2014-09-06 MED ORDER — FAT EMULSION 20 % IV EMUL
240.0000 mL | INTRAVENOUS | Status: AC
  Administered 2014-09-06: 240 mL via INTRAVENOUS
  Filled 2014-09-06: qty 250

## 2014-09-06 MED ORDER — SODIUM PHOSPHATE 3 MMOLE/ML IV SOLN
10.0000 mmol | Freq: Once | INTRAVENOUS | Status: AC
  Administered 2014-09-06: 10 mmol via INTRAVENOUS
  Filled 2014-09-06: qty 3.33

## 2014-09-06 MED ORDER — SODIUM CHLORIDE 0.9 % IV SOLN: INTRAVENOUS | Status: DC

## 2014-09-06 NOTE — Progress Notes (Signed)
PARENTERAL NUTRITION CONSULT NOTE - INITIAL  Pharmacy Consult for TPN Indication: severe IBD with fistula and abscess  Allergies  Allergen Reactions  . Clindamycin/Lincomycin Diarrhea  . Ivp Dye [Iodinated Diagnostic Agents] Other (See Comments)    "almost passed out"    Patient Measurements: Height: 5\' 9"  (175.3 cm) Weight: 229 lb 4.5 oz (104 kg) IBW/kg (Calculated) : 66.2 Adjusted Body Weight: 81.3kg  Vital Signs: Temp: 98 F (36.7 C) (07/20 1219) Temp Source: Oral (07/20 1402) BP: 129/61 mmHg (07/20 1420) Pulse Rate: 86 (07/20 1420) Intake/Output from previous day: 07/19 0701 - 07/20 0700 In: 1791.7 [P.O.:720; I.V.:1071.7] Out: 3025 [Urine:2925; Stool:100] Intake/Output from this shift:    Labs:  Recent Labs  09/04/14 0455 09/04/14 1617 09/05/14 0445 09/06/14 0410  WBC 14.4*  --  14.6* 14.9*  HGB 7.2* 9.2* 9.3* 8.8*  HCT 22.9* 28.2* 28.3* 27.2*  PLT 344  --  377 420*     Recent Labs  09/04/14 1359 09/05/14 0445 09/06/14 0410  NA 130* 127* 127*  K 4.3 4.3 4.4  CL 95* 94* 96*  CO2 29 26 27   GLUCOSE 88 97 140*  BUN <5* 6 11  CREATININE 0.53 0.56 0.40*  CALCIUM 7.4* 7.6* 7.7*   Estimated Creatinine Clearance: 76.8 mL/min (by C-G formula based on Cr of 0.4).   No results for input(s): GLUCAP in the last 72 hours.  Medical History: Past Medical History  Diagnosis Date  . CAD (coronary artery disease)     unspecified  . Cerebrovascular disease   . Hyperlipidemia, mixed   . HTN (hypertension)   . Ulcerative colitis   . Diverticulitis   . Colon polyps 2006  . Thyroid disease   . Myocardial infarct   . Carotid artery occlusion 2010  . Diabetes mellitus without complication     Borderline  . GERD (gastroesophageal reflux disease)   . Arthritis     Medications:  Scheduled:  . acetaminophen  1,000 mg Oral TID  . amLODipine  10 mg Oral Daily  . colestipol  3 g Oral Q12H  . fluconazole  400 mg Oral Daily  . lip balm  1 application Topical  BID  . mesalamine  4.8 g Oral Q breakfast  . methylPREDNISolone (SOLU-MEDROL) injection  20 mg Intravenous 3 times per day  . metoprolol succinate  25 mg Oral Daily  . metroNIDAZOLE  250 mg Oral 3 times per day  . nystatin   Topical BID  . psyllium  1 packet Oral BID  . saccharomyces boulardii  250 mg Oral BID  . sodium chloride  10-40 mL Intracatheter Q12H  . sodium chloride  3 mL Intravenous Q12H  . sodium chloride  3 mL Intravenous Q12H   Insulin Requirements: no history of diabetes, no insulin orders currently  Current Nutrition: NPO  IVF: NS @ 49ml/hr  Central access: PICC placed 08/30/14 TPN start date: 7/20  ASSESSMENT    74 y.o. female with past medical history of dyslipidemia, hypertension, long standing ulcerative colitis and recently hospitalized from 08/07/2014 through 08/21/2014 for ulcerative colitis flare.  She presented to [facility] long hospital with perianal pain started the night prior to the admission. 08/21/13 underwent incision and drainage of the large issue rectal abscess on the right side.  Pt is scheduled for a perineal resection and colostomy for 7/21.  Pharmacy consulted to start TPN for severe IBD with fistula and abscess.  Significant events:   Today:    Glucose - CBG's <150  Electrolytes - Na 127, Phos 2.6, Mag 1.4, K 4.4  Renal -WNL  LFTs - pending labs tomorrow  TGs - pending labs tomorrow  Prealbumin - pending labs tomorrow  NUTRITIONAL GOALS                                                                                             RD recs: pending Clinimix E 5/15 at a goal rate of 36ml/hr + 20% fat emulsion at 79ml/hr to provide: 84g/day protein, 1672Kcal/day.  PLAN                                                                                                  Mag sulfage 2gm x 1 Na Phos 7mmols x 1                    At 1800  today:  Start Clinimix E 5/15 at 51ml/hr.  20% fat emulsion at 27ml/hr.  Plan to advance as tolerated to the goal rate.  TPN to contain standard multivitamins and trace elements.  Reduce IVF to 39ml/hr.  Add moderate SSI .   F/u TPN labs in am  TPN lab panels on Mondays & Thursdays.  F/u RD recs  F/u daily.   Dolly Rias RPh 09/06/2014, 4:12 PM Pager [phone]

## 2014-09-06 NOTE — Progress Notes (Signed)
     [facility] Progress Note  Subjective:  Still with diarrhea and rectal bleeding.  For surgery tomorrow.  Objective:  Vital signs in last 24 hours: Temp:  [98 F (36.7 C)-98.2 F (36.8 C)] 98.2 F (36.8 C) (07/20 0654) Pulse Rate:  [70-89] 82 (07/20 0654) Resp:  [16-19] 18 (07/20 0654) BP: (126-145)/(65-82) 140/65 mmHg (07/20 0654) SpO2:  [97 %-100 %] 97 % (07/20 0654) Last BM Date: 09/05/14 General:  Alert, Well-developed, in NAD Heart:  Regular rate and rhythm; no murmurs Pulm:  CTAB.  No W/R/R. Abdomen:  Soft, non-distended. BS present.  Non-tender. Extremities:  Without edema. Neurologic:  Alert and  oriented x4;  grossly normal neurologically. Psych:  Alert and cooperative. Normal mood and affect.  Intake/Output from previous day: 07/19 0701 - 07/20 0700 In: 1791.7 [P.O.:720; I.V.:1071.7] Out: 3025 [Urine:2925; Stool:100]  Lab Results:  Recent Labs  09/04/14 0455 09/04/14 1617 09/05/14 0445 09/06/14 0410  WBC 14.4*  --  14.6* 14.9*  HGB 7.2* 9.2* 9.3* 8.8*  HCT 22.9* 28.2* 28.3* 27.2*  PLT 344  --  377 420*   BMET  Recent Labs  09/04/14 1359 09/05/14 0445 09/06/14 0410  NA 130* 127* 127*  K 4.3 4.3 4.4  CL 95* 94* 96*  CO2 29 26 27   GLUCOSE 88 97 140*  BUN <5* 6 11  CREATININE 0.53 0.56 0.40*  CALCIUM 7.4* 7.6* 7.7*   Assessment / Plan: 1) IBD with acute activation of colitis: Received her first dose of Remicade on 7/15 and is on Lialda 4.8 grams daily. Solumedrol 10 mg IV BID added 7/18 with surgery's approval.  Flagyl 250 mg TID added 7/19.  Still with diarrhea and bleeding. 2) Perirectal abscess-POD #14 from I&D.  Finished with 14 day course of Zosyn.  Now large concern for fistula (? Crohn's disease rather than UC).  For diverting colostomy tomorrow. 3) B/L LE DVT's-IVC filter in place. 4) Acute blood loss anemia: Received one unit PRBC's 7/18 for Hgb of 7.2 grams; so far Hgb stable since transfusion.  *Dr. Olevia Perches to  perform flex sig today to assess if there has been any response to treatment.   LOS: 15 days   ZEHR, JESSICA D.  09/06/2014, 9:10 AM  Pager number [phone] Attending MD note:   I have taken a history, examined the patient, and reviewed the chart. I agree with the Advanced Practitioner's impression and recommendations. Discussed with W.JenningsPA, flex sigm to delineated "healthy" mucosa, will tattoo the transition.  [facility] Pager # [phone]

## 2014-09-06 NOTE — Progress Notes (Signed)
[facility] PROGRESS NOTE  [name] GEX:[id number] DOB: 1940-04-04 DOA: 08/22/2014 PCP: Kandice Hams, MD  Brief narrative: 74 y.o. female with past medical history of dyslipidemia, hypertension, long standing ulcerative colitis and recently hospitalized from 08/07/2014 through 08/21/2014 for ulcerative colitis flare. She had flexible sigmoidoscopy 08/08/2014 which demonstrated inflammatory changes from the rectum consistent with ulcerative colitis. She presented to [facility] long hospital with perianal pain started the night prior to the admission. CT scan on the admission demonstrated a complex perirectal infection extending up toward the retroperitoneal area.  08/21/13 underwent incision and drainage of the large issue rectal abscess on the right side. She was started on vancomycin and Zosyn. Postprocedure she was doing fine but has developed large amount of bleed on the morning of 08/23/2014. GI was consulted and pt was started on prednisone and mesalamine.  Hospital course is complicated with ongoing rectal bleed so Additionally, she was found to have left LE DVT On venous doppler done 7/12.   On 7/20, pt underwent flex sig with findings of significant L sided disease with GI recs for continued bowel rest, PO flagyl, increased steroids, and Remicade.  Assessment/Plan: Sepsis secondary to peri-rectal abscess / leukocytosis  - Sepsis on admission with tachycardia, tachypnea, hypotension, leukocytosis. Source of infection is perirectal abscess. - S/P incision and drainage by surgery 08/23/2014 - Started on vancomycin and Zosyn at the time of the admission. Abscess culture demonstrated rare yeast and multiple other organisms/ none predominant - Blood cultures negative.  - Vancomycin was stopped 08/26/2014. Currently on Zosyn and fluconazole. - Dr. Megan Salon of infectious disease recommended total duration of antibiotics - 14 days with stop date on 7/18- but due to stool in abscess cavity  and possible fistula, abx duration to be deferred to Surgery - Originally planned for surgery 7/21, but on hold for now  Bloody diarrhea due to UC  - Patient had a rectal bleed after incision and drainage done 08/23/2014 which has continued -GI consulted and pt started on prednisone and mesalamine which was not effective in controlling diarrhea/ bleeding and therefore started on Remicaid 7/15 - placed on IV steroids yesterday  - see above in regards to fistula- only had 275 cc of stool through rectal tube yesterday - Pt is now s/p flex sig with findings of significant disease. Discussed with GI with plans for continued bowel rest  Acute blood loss anemia  - Hb 12 on admission and 7.2 on 7/18- transfused 1 U PRBC   Essential hypertension - SBP was elevated in 150- 160s-stopped Hydralzine and added Norvasc for ease of dosing and as she was slightly tachycardic- added toprol 50mg   B/L lower extremity DVT - Not a candidate for anticoagulation due to ongoing uncontrolled ulcerative colitis and rectal bleed - therefore, an IVC filter was placed   Hypokalemia/ hyponatremia - Likely from GI losses - sodium low today- continued on IVF   Generalized weakness  - Patient will be discharged to skilled nursing facility once medically stable.  Code Status: Full Family Communication: Pt in room (indicate person spoken with, relationship, and if by phone, the number) Disposition Plan: Pending   Consultants:  GI  General Surgery  Procedures:  IVC filter   PICC line  venous duplex  Flex sib 7/20  Antibiotics: Fluconazole 7/8>>> Flagyl 7/19>>>  HPI/Subjective: Complains of lower quadrant pains  Objective: Filed Vitals:   09/06/14 1405 09/06/14 1410 09/06/14 1415 09/06/14 1420  BP:  125/60  129/61  Pulse: 98 88 90 86  Temp:  TempSrc:      Resp: 10 10 9 9   Height:      Weight:      SpO2: 97% 97% 97% 98%    Intake/Output Summary (Last 24 hours) at 09/06/14  1552 Last data filed at 09/06/14 1528  Gross per 24 hour  Intake 1431.67 ml  Output   3575 ml  Net -2143.33 ml   Filed Weights   08/22/14 2113 08/23/14 0153 08/23/14 0434  Weight: 99.791 kg (220 lb) 102.3 kg (225 lb 8.5 oz) 104 kg (229 lb 4.5 oz)    Exam:   General:  Awake, in nad  Cardiovascular: regular, s1, s2  Respiratory: normal resp effort, no wheezing  Abdomen: soft, generally tender, decreased BS  Musculoskeletal: perfused, no clubbing   Data Reviewed: Basic Metabolic Panel:  Recent Labs Lab 09/02/14 0431 09/04/14 1359 09/05/14 0445 09/06/14 0410  NA 132* 130* 127* 127*  K 3.4* 4.3 4.3 4.4  CL 93* 95* 94* 96*  CO2 30 29 26 27   GLUCOSE 69 88 97 140*  BUN 7 <5* 6 11  CREATININE 0.65 0.53 0.56 0.40*  CALCIUM 7.4* 7.4* 7.6* 7.7*  MG  --   --   --  1.4*  PHOS  --   --   --  2.6   Liver Function Tests: No results for input(s): AST, ALT, ALKPHOS, BILITOT, PROT, ALBUMIN in the last 168 hours. No results for input(s): LIPASE, AMYLASE in the last 168 hours. No results for input(s): AMMONIA in the last 168 hours. CBC:  Recent Labs Lab 09/01/14 0525 09/02/14 0431 09/04/14 0455 09/04/14 1617 09/05/14 0445 09/06/14 0410  WBC 13.1* 18.4* 14.4*  --  14.6* 14.9*  HGB 7.9* 7.9* 7.2* 9.2* 9.3* 8.8*  HCT 24.4* 24.9* 22.9* 28.2* 28.3* 27.2*  MCV 95.7 95.4 96.6  --  92.8 93.8  PLT 247 291 344  --  377 420*   Cardiac Enzymes: No results for input(s): CKTOTAL, CKMB, CKMBINDEX, TROPONINI in the last 168 hours. BNP (last 3 results) No results for input(s): BNP in the last 8760 hours.  ProBNP (last 3 results) No results for input(s): PROBNP in the last 8760 hours.  CBG:  Recent Labs Lab 08/31/14 2143  GLUCAP 123*    Recent Results (from the past 240 hour(s))  Clostridium Difficile by PCR (not at [facility])     Status: None   Collection Time: 08/28/14  7:11 PM  Result Value Ref Range Status   C difficile by pcr NEGATIVE NEGATIVE Final     Studies: No  results found.  Scheduled Meds: . acetaminophen  1,000 mg Oral TID  . amLODipine  10 mg Oral Daily  . colestipol  3 g Oral Q12H  . fluconazole  400 mg Oral Daily  . lip balm  1 application Topical BID  . mesalamine  4.8 g Oral Q breakfast  . methylPREDNISolone (SOLU-MEDROL) injection  20 mg Intravenous 3 times per day  . metoprolol succinate  25 mg Oral Daily  . metroNIDAZOLE  250 mg Oral 3 times per day  . nystatin   Topical BID  . psyllium  1 packet Oral BID  . saccharomyces boulardii  250 mg Oral BID  . sodium chloride  10-40 mL Intracatheter Q12H  . sodium chloride  3 mL Intravenous Q12H  . sodium chloride  3 mL Intravenous Q12H   Continuous Infusions: . sodium chloride 100 mL/hr at 09/05/14 0817  . sodium chloride    . sodium chloride 75 mL/hr at  09/06/14 1528    Principal Problem:   Abscess, perirectal s/p I&D 08/23/2014 Active Problems:   Essential hypertension   Inflammatory bowel disease (IBD) with colitis   Sepsis   Infection due to yeast   Acute blood loss anemia   Leukocytosis   Hypokalemia   Hyponatremia   General weakness   Thrombocytopenia   Ulcerative colitis   DVT (deep venous thrombosis)   DVT (deep vein thrombosis) in pregnancy   Lillie Bollig, [facility] Pager [phone]. If 7PM-7AM, please contact night-coverage at [email or web address], password [facility] 09/06/2014, 3:52 PM  LOS: 15 days

## 2014-09-06 NOTE — Progress Notes (Signed)
[facility] Progress Note  15 Days Post-Op  Subjective: Pt doing okay, still has a lot of pain over her bottom.  No N/V, tolerating diet.  Not mobilizing much.  Ready to get surgery over with tomorrow.  +flatus and BM's.  Patient has asked [place] not to look at her rectum today given pain.  Objective: Vital signs in last 24 hours: Temp:  [98 F (36.7 C)-98.2 F (36.8 C)] 98.2 F (36.8 C) (07/20 0654) Pulse Rate:  [70-89] 82 (07/20 0654) Resp:  [16-19] 18 (07/20 0654) BP: (126-145)/(65-82) 140/65 mmHg (07/20 0654) SpO2:  [97 %-100 %] 97 % (07/20 0654) Last BM Date: 09/05/14  Intake/Output from previous day: 07/19 0701 - 07/20 0700 In: 1791.7 [P.O.:720; I.V.:1071.7] Out: 3025 [Urine:2925; Stool:100] Intake/Output this shift:    PE: Gen:  Alert, NAD, pleasant Abd: Soft, NT/ND, +BS, no HSM Perirectum:  Deferred today due to pain   Lab Results:   Recent Labs  09/05/14 0445 09/06/14 0410  WBC 14.6* 14.9*  HGB 9.3* 8.8*  HCT 28.3* 27.2*  PLT 377 420*   BMET  Recent Labs  09/05/14 0445 09/06/14 0410  NA 127* 127*  K 4.3 4.4  CL 94* 96*  CO2 26 27  GLUCOSE 97 140*  BUN 6 11  CREATININE 0.56 0.40*  CALCIUM 7.6* 7.7*   PT/INR No results for input(s): LABPROT, INR in the last 72 hours. CMP     Component Value Date/Time   NA 127* 09/06/2014 0410   K 4.4 09/06/2014 0410   CL 96* 09/06/2014 0410   CO2 27 09/06/2014 0410   GLUCOSE 140* 09/06/2014 0410   BUN 11 09/06/2014 0410   CREATININE 0.40* 09/06/2014 0410   CREATININE 1.08 03/28/2014 1022   CALCIUM 7.7* 09/06/2014 0410   PROT 4.9* 08/23/2014 0345   ALBUMIN 2.0* 08/23/2014 0345   AST 15 08/23/2014 0345   ALT 13* 08/23/2014 0345   ALKPHOS 46 08/23/2014 0345   BILITOT 0.7 08/23/2014 0345   GFRNONAA >60 09/06/2014 0410   GFRNONAA 50* 03/28/2014 1022   GFRAA >60 09/06/2014 0410   GFRAA 58* 03/28/2014 1022   Lipase     Component Value Date/Time   LIPASE 13* 08/07/2014 1201        Studies/Results: No results found.  Anti-infectives: Anti-infectives    Start     Dose/Rate Route Frequency Ordered Stop   09/05/14 1500  metroNIDAZOLE (FLAGYL) tablet 250 mg     250 mg Oral 3 times per day 09/05/14 1440     08/28/14 0000  fluconazole (DIFLUCAN) 200 MG tablet     400 mg Oral Daily 08/28/14 1052     08/28/14 0000  amoxicillin-clavulanate (AUGMENTIN) 875-125 MG per tablet     1 tablet Oral 2 times daily 08/28/14 1052     08/25/14 2000  vancomycin (VANCOCIN) IVPB 1000 mg/200 mL premix  Status:  Discontinued     1,000 mg 200 mL/hr over 60 Minutes Intravenous Every 12 hours 08/25/14 1009 08/26/14 0949   08/25/14 1200  fluconazole (DIFLUCAN) tablet 400 mg     400 mg Oral Daily 08/25/14 0943     08/23/14 1000  vancomycin (VANCOCIN) IVPB 750 mg/150 ml premix  Status:  Discontinued     750 mg 150 mL/hr over 60 Minutes Intravenous Every 12 hours 08/22/14 2127 08/25/14 1009   08/23/14 0600  piperacillin-tazobactam (ZOSYN) IVPB 3.375 g  Status:  Discontinued     3.375 g 12.5 mL/hr over 240 Minutes Intravenous 3 times per  day 08/22/14 2124 09/05/14 1356   08/22/14 2130  piperacillin-tazobactam (ZOSYN) IVPB 3.375 g     3.375 g 100 mL/hr over 30 Minutes Intravenous NOW 08/22/14 2124 08/22/14 2220   08/22/14 2130  [MAR Hold]  vancomycin (VANCOCIN) 2,000 mg in sodium chloride 0.9 % 500 mL IVPB     (MAR Hold since 08/22/14 2338)   2,000 mg 250 mL/hr over 120 Minutes Intravenous NOW 08/22/14 2127 08/23/14 0008       Assessment/Plan Ulcerative colitis -Per GI started on Remicade and now steroids, still having diarrhea and bleeding -GI has some concern this may be Crohns instead of UC given fistula POD#15 I&D perirectal abscess---Dr. Zella Richer Perirectal fistula -Patient continues to have loose stools and stools are coming from wound cavity -Plan for laparoscopic diverting colostomy, WOC nursing to mark for colostomy -D/c Hydro, but continue QID wet to dry dressing  changes (with kerlix gauze) and gentle irrigation of wound -Ordered air mattress -Leukocytosis 14.9 today (trending up for 3rd day) -Mobilize, turning to avoid further skin breakdown, IS -HH diet, NPO MN -We agreed with GI that a trial of steroids while not ideal may be beneficial at this point ABL Anemia -Given 1 unit of pRBC 09/03/14 -Hgb down to 8.8 today ID-zosyn D 14/14, now discontinued, started on flagyl per GI New DVT s/p IVC filter  -Holding pharm DVT proph due to anemia, but on SCD's    LOS: 15 days    Nat Christen 09/06/2014, 7:36 AM Pager: [phone]

## 2014-09-06 NOTE — Progress Notes (Signed)
Clinical Social Work  Patient discussed at progression meeting and MD reports patient remains unstable for DC. CSW will continue to follow to assist with SNF placement.  [place], [place] [phone]

## 2014-09-06 NOTE — Op Note (Signed)
[facility] [place], [id number]   FLEXIBLE SIGMOIDOSCOPY PROCEDURE REPORT  PATIENT: [name], [name]  MR#: [id number] BIRTHDATE: [birth year] , 36  yrs. old GENDER: female ENDOSCOPIST: Lafayette Dragon, MD REFERRED BY: Dr Marylu Lund   ,Dr Excell Seltzer PROCEDURE DATE:  09/06/2014 PROCEDURE:   Sigmoidoscopy with biopsy ASA CLASS:   Class III INDICATIONS:high risk previously diagnosed Crohn's disease: large intestine. MEDICATIONS: Fentanyl 50 mcg IV and Versed 4 mg IV  DESCRIPTION OF PROCEDURE:   After the risks benefits and alternatives of the procedure were thoroughly explained, informed consent was obtained.  Digital exam revealed decreased sphincter tone and Digital exam revealed a perianal abscess. The endoscope was introduced through the anus  and advanced to the splenic flexure , The exam was Without limitations.    The quality of the prep was The overall prep quality was fair. . Estimated blood loss is zero unless otherwise noted in this procedure report. The instrument was then slowly withdrawn as the mucosa was fully examined.         COLON FINDINGS: Mucosa of the left colon starting at the rectum was severely edematous with multiple superficial aphthous ulcerations, suggestive of Crohn's colitis.  In the sigmoid and descending colon the ulcerations were serpiginous and much larger, almost confluent. There was severe edema and friability of the mucosa in between the ulcerations.  The lumen was narrowed due to edema.  I was able to advanced to 80 cm around splenic flexure where it was rather difficult to advance because of the severity of the inflammation. Some of ulcerations were very deep.  Biopsies were obtained from splenic flexure and separately from sigmoid colon .There was no normal appearing mucosa from the rectum up to the splenic flexure at 80 cm There was a large perirectal abscess cavity at the anal orifice.  I did not see any  fistulous communication with the rectosigmoid.and perirectal cavity, but there was lot of liquid stool and blood obscuring the view.    Retroflexion was not performed due to a narrow rectal vault.    The scope was then withdrawn from the patient and the procedure terminated.  COMPLICATIONS: There were no immediate complications.  ENDOSCOPIC IMPRESSION: Mucosa of the left colon starting at the rectum was severely edematous with multiple superficial aphthous ulcerations, suggestive of Crohn's colitis.  In the sigmoid and descending colon the ulcerations were serpiginous and much larger, almost confluent. There was severe edema and friability of the mucosa in between the ulcerations.  The lumen was narrowed due to edema.  I was able to advanced to 80 cm around splenic flexure where it was rather difficult to advance because of the severity of the inflammation. Some of ulcerations were very deep.  Biopsies were obtained from splenic flexure and separately from sigmoid colon .There was no normal appearing mucosa from the rectum up to the splenic flexure at 80 cm There was a large perirectal abscess cavity at the anal orifice.  I did not see any clear communication with the rectosigmoid  In summary:  there is a severe left-sided colitis extending from anal canal  to splenic flexure at 80 cm ,consistent with Crohn's colitis. There is no normal appearing mucosa. The bleeding is originating along the left colon from the large ulcerations  RECOMMENDATIONS: 1. increase steroids 2. Bowel rest with the TPN 3. Continue Flagyl orally 4. Continue Remicade induction regimen 5.  consider total colectomy as an alternative to diverting colostomy ,I think colitis can  potentially heal with complete bowel rest , steroids and biologicals ,but  the rectal abscess may not heal under immunosupression 6  .continue mesalamine  REPEAT EXAM: possibly in the near future  eSigned:  Lafayette Dragon, MD  09/06/2014 2:10 PM   CC:  PATIENT NAME:  [name], [name] MR#: [id number]

## 2014-09-06 NOTE — Consult Note (Signed)
WOC ostomy consult note Patient is scheduled for a perineal resection tomorrow and [facility] team has been asked to mark for a colostomy. Patient's brother has had an ostomy for over 20 years and she feels fairly knowledgeable regarding the care of this.  We discussed pouch changing, ordering supplies and that [facility] team would provide ongoing education. Pateint is evaluated sitting and lying for optimal stoma site location.  Site is marked 4 cm left and 3 cm below umbilicus, within the rectus muscle.  Patient has abdominal pannus that sits on top of thighs and efforts were made to avoid this area for optimal pouching.   Education provided: Questions were answered and written materials provided.  Patient is understanding of her upcoming surgery and resulting colostomy.  Enrolled patient in [place] program: No WOC team will follow.  Domenic Moras RN BSN [place] Pager [phone]

## 2014-09-07 ENCOUNTER — Encounter (HOSPITAL_COMMUNITY)
Admission: EM | Disposition: A | Discharge status: Skilled Nursing Facility | Payer: Self-pay | Source: Home / Self Care | Attending: Internal Medicine

## 2014-09-07 ENCOUNTER — Encounter (HOSPITAL_COMMUNITY): Payer: Self-pay | Admitting: Internal Medicine

## 2014-09-07 LAB — COMPREHENSIVE METABOLIC PANEL
ALT: 19 U/L (ref 14–54)
AST: 29 U/L (ref 15–41)
Albumin: 1.8 g/dL — ABNORMAL LOW (ref 3.5–5.0)
Alkaline Phosphatase: 108 U/L (ref 38–126)
Anion gap: 5 (ref 5–15)
BILIRUBIN TOTAL: 0.3 mg/dL (ref 0.3–1.2)
BUN: 10 mg/dL (ref 6–20)
CALCIUM: 7.7 mg/dL — AB (ref 8.9–10.3)
CO2: 31 mmol/L (ref 22–32)
Chloride: 97 mmol/L — ABNORMAL LOW (ref 101–111)
Creatinine, Ser: 0.46 mg/dL (ref 0.44–1.00)
GFR calc Af Amer: >60 mL/min (ref >60)
GFR calc non Af Amer: >60 mL/min (ref >60)
Glucose, Bld: 155 mg/dL — ABNORMAL HIGH (ref 65–99)
POTASSIUM: 4.3 mmol/L (ref 3.5–5.1)
Sodium: 133 mmol/L — ABNORMAL LOW (ref 135–145)
TOTAL PROTEIN: 5.1 g/dL — AB (ref 6.5–8.1)

## 2014-09-07 LAB — CBC
HCT: 26.1 % — ABNORMAL LOW (ref 36.0–46.0)
Hemoglobin: 8.2 g/dL — ABNORMAL LOW (ref 12.0–15.0)
MCH: 29.8 pg (ref 26.0–34.0)
MCHC: 31.4 g/dL (ref 30.0–36.0)
MCV: 94.9 fL (ref 78.0–100.0)
Platelets: 362 10*3/uL (ref 150–400)
RBC: 2.75 MIL/uL — ABNORMAL LOW (ref 3.87–5.11)
RDW: 17.3 % — AB (ref 11.5–15.5)
WBC: 11.8 10*3/uL — ABNORMAL HIGH (ref 4.0–10.5)

## 2014-09-07 LAB — DIFFERENTIAL
Basophils Absolute: 0 10*3/uL (ref 0.0–0.1)
Basophils Relative: 0 % (ref 0–1)
EOS ABS: 0 10*3/uL (ref 0.0–0.7)
Eosinophils Relative: 0 % (ref 0–5)
Lymphocytes Relative: 9 % — ABNORMAL LOW (ref 12–46)
Lymphs Abs: 1 10*3/uL (ref 0.7–4.0)
Monocytes Absolute: 0.2 10*3/uL (ref 0.1–1.0)
Monocytes Relative: 2 % — ABNORMAL LOW (ref 3–12)
NEUTROS ABS: 10.6 10*3/uL — AB (ref 1.7–7.7)
Neutrophils Relative %: 89 % — ABNORMAL HIGH (ref 43–77)

## 2014-09-07 LAB — TRIGLYCERIDES: Triglycerides: 128 mg/dL (ref <150)

## 2014-09-07 LAB — GLUCOSE, CAPILLARY
GLUCOSE-CAPILLARY: 145 mg/dL — AB (ref 65–99)
Glucose-Capillary: 152 mg/dL — ABNORMAL HIGH (ref 65–99)
Glucose-Capillary: 150 mg/dL — ABNORMAL HIGH (ref 65–99)
Glucose-Capillary: 132 mg/dL — ABNORMAL HIGH (ref 65–99)

## 2014-09-07 LAB — PREALBUMIN: PREALBUMIN: 19.6 mg/dL (ref 18–38)

## 2014-09-07 LAB — MAGNESIUM: Magnesium: 2.5 mg/dL — ABNORMAL HIGH (ref 1.7–2.4)

## 2014-09-07 LAB — PHOSPHORUS: PHOSPHORUS: 3.2 mg/dL (ref 2.5–4.6)

## 2014-09-07 SURGERY — CREATION, COLOSTOMY, DIVERTING, LAPAROSCOPIC
Anesthesia: General

## 2014-09-07 MED ORDER — FAT EMULSION 20 % IV EMUL
240.0000 mL | INTRAVENOUS | Status: AC
  Administered 2014-09-07: 240 mL via INTRAVENOUS
  Filled 2014-09-07: qty 250

## 2014-09-07 MED ORDER — M.V.I. ADULT IV INJ
INJECTION | INTRAVENOUS | Status: AC
  Administered 2014-09-07: 18:00:00 via INTRAVENOUS
  Filled 2014-09-07: qty 1440

## 2014-09-07 MED ORDER — SODIUM CHLORIDE 0.9 % IV SOLN: INTRAVENOUS | Status: DC

## 2014-09-07 NOTE — Progress Notes (Signed)
PT Cancellation Note  Patient Details Name: [name] MRN: [id number] DOB: Jul 18, 1940   Cancelled Treatment:    Reason Eval/Treat Not Completed: Medical issues which prohibited therapy (being cleaned of BM, uncertain of mobility goals with wound and continued diarrhea.)   [name] 09/07/2014, 2:30 PM

## 2014-09-07 NOTE — Progress Notes (Signed)
     [facility] Progress Note  Subjective:  Patient seen with family at bedside (husband and two daughters).  No abdominal pain.  Flexi-seal removed at flex sig yesterday and not replaced.  Per nursing patient having some diarrhea, but not running out like it was; no bleeding.  Objective:  Vital signs in last 24 hours: Temp:  [98 F (36.7 C)-98.7 F (37.1 C)] 98.7 F (37.1 C) (07/21 0555) Pulse Rate:  [79-105] 79 (07/21 0555) Resp:  [9-19] 14 (07/21 0555) BP: (113-194)/(60-95) 113/62 mmHg (07/21 0555) SpO2:  [92 %-100 %] 96 % (07/21 0555) Last BM Date: 09/06/14 General:  Alert, Well-developed, in NAD Heart:  Regular rate and rhythm; no murmurs Pulm:  CTAB.  No W/R/R. Abdomen:  Soft, non-distended. Normal bowel sounds.  Non-tender. Extremities:  Without edema. Neurologic:  Alert and  oriented x4;  grossly normal neurologically. Psych:  Alert and cooperative. Normal mood and affect.  Intake/Output from previous day: 07/20 0701 - 07/21 0700 In: -  Out: 2750 [Urine:2750]  Lab Results:  Recent Labs  09/05/14 0445 09/06/14 0410 09/07/14 0438  WBC 14.6* 14.9* 11.8*  HGB 9.3* 8.8* 8.2*  HCT 28.3* 27.2* 26.1*  PLT 377 420* 362   BMET  Recent Labs  09/05/14 0445 09/06/14 0410 09/07/14 0438  NA 127* 127* 133*  K 4.3 4.4 4.3  CL 94* 96* 97*  CO2 26 27 31   GLUCOSE 97 140* 155*  BUN 6 11 10   CREATININE 0.56 0.40* 0.46  CALCIUM 7.6* 7.7* 7.7*   LFT  Recent Labs  09/07/14 0438  PROT 5.1*  ALBUMIN 1.8*  AST 29  ALT 19  ALKPHOS 108  BILITOT 0.3   Assessment / Plan: 1) IBD with severe active colitis: Received her first dose of Remicade on 7/15 and is on Lialda 4.8 grams daily. Solumedrol 10 mg IV BID added 7/18 with surgery's approval and then increased to 20 mg TID on 7/20. Flagyl 250 mg TID added 7/19. 2) Perirectal abscess-POD #15 from I&D. Finished with 14 day course of Zosyn per ID. Now large concern for fistula (? Crohn's disease rather  than UC). Plans for diverting colostomy cancelled after flex sig yesterday showing severe colitis throughout the left colon. 3) B/L LE DVT's-IVC filter in place. 4) Acute blood loss anemia: Received one unit PRBC's 7/18 for Hgb of 7.2 grams; so far Hgb stable since transfusion. 5) Malnutrition:  Placed on TNA 7/20.  *Was also placed NPO on 7/20 with TNA started.  She is asking for just sips of ginger ale; will allow.   LOS: 16 days   ZEHR, JESSICA D.  09/07/2014, 9:26 AM  Pager number [phone] Attending MD note:   I have taken a history, examined the patient, and reviewed the chart. I agree with the Advanced Practitioner's impression and recommendations. May have sips of clears. Stool volume has decreased. Will keep flexiseal out  for now.Plan complete bowl rest and c TNA for a week , she will be due for second Remicaide infusion. next week.Follow nutritional paramenters  [facility] Pager # [phone]

## 2014-09-07 NOTE — Progress Notes (Signed)
PARENTERAL NUTRITION CONSULT NOTE - INITIAL  Pharmacy Consult for TPN Indication: severe IBD with fistula and abscess  Allergies  Allergen Reactions  . Clindamycin/Lincomycin Diarrhea  . Ivp Dye [Iodinated Diagnostic Agents] Other (See Comments)    "almost passed out"    Patient Measurements: Height: 5\' 9"  (175.3 cm) Weight: 229 lb 4.5 oz (104 kg) IBW/kg (Calculated) : 66.2 Adjusted Body Weight: 81.3kg  Vital Signs: Temp: 98.7 F (37.1 C) (07/21 0555) Temp Source: Oral (07/21 0555) BP: 113/62 mmHg (07/21 0555) Pulse Rate: 79 (07/21 0555) Intake/Output from previous day: 07/20 0701 - 07/21 0700 In: -  Out: 2750 [Urine:2750] Intake/Output from this shift:    Labs:  Recent Labs  09/05/14 0445 09/06/14 0410 09/07/14 0438  WBC 14.6* 14.9* 11.8*  HGB 9.3* 8.8* 8.2*  HCT 28.3* 27.2* 26.1*  PLT 377 420* 362     Recent Labs  09/05/14 0445 09/06/14 0410 09/07/14 0438  NA 127* 127* 133*  K 4.3 4.4 4.3  CL 94* 96* 97*  CO2 26 27 31   GLUCOSE 97 140* 155*  BUN 6 11 10   CREATININE 0.56 0.40* 0.46  CALCIUM 7.6* 7.7* 7.7*  MG  --  1.4* 2.5*  PHOS  --  2.6 3.2  PROT  --   --  5.1*  ALBUMIN  --   --  1.8*  AST  --   --  29  ALT  --   --  19  ALKPHOS  --   --  108  BILITOT  --   --  0.3   Estimated Creatinine Clearance: 76.8 mL/min (by C-G formula based on Cr of 0.46).    Recent Labs  09/06/14 2104 09/06/14 2329 09/07/14 0425  GLUCAP 135* 174* 152*    Medical History: Past Medical History  Diagnosis Date  . CAD (coronary artery disease)     unspecified  . Cerebrovascular disease   . Hyperlipidemia, mixed   . HTN (hypertension)   . Ulcerative colitis   . Diverticulitis   . Colon polyps 2006  . Thyroid disease   . Myocardial infarct   . Carotid artery occlusion 2010  . Diabetes mellitus without complication     Borderline  . GERD (gastroesophageal reflux disease)   . Arthritis     Medications:  Scheduled:  . amLODipine  10 mg Oral Daily   . fluconazole (DIFLUCAN) IV  400 mg Intravenous Q24H  . insulin aspart  0-15 Units Subcutaneous 6 times per day  . lip balm  1 application Topical BID  . mesalamine  4.8 g Oral Q breakfast  . methylPREDNISolone (SOLU-MEDROL) injection  20 mg Intravenous 3 times per day  . metoprolol  2.5 mg Intravenous 4 times per day  . metronidazole  250 mg Intravenous Q8H  . nystatin   Topical BID  . saccharomyces boulardii  250 mg Oral BID  . sodium chloride  10-40 mL Intracatheter Q12H  . sodium chloride  3 mL Intravenous Q12H  . sodium chloride  3 mL Intravenous Q12H   Insulin Requirements: no history of diabetes; on moderate SSI q4h; 6 units since start of TPN  Current Nutrition: NPO  IVF: NS @ 72ml/hr  Central access: PICC placed 08/30/14 TPN start date: 7/20  ASSESSMENT    74 y.o. female with past medical history of dyslipidemia, hypertension, long standing ulcerative colitis and recently hospitalized from 08/07/2014 through 08/21/2014 for ulcerative colitis flare.  She presented to [facility] long hospital with perianal pain started the night prior to  the admission. 08/21/13 underwent incision and drainage of the large issue rectal abscess on the right side.  Pt is scheduled for a perineal resection and colostomy for 7/21.  Pharmacy consulted to start TPN for severe IBD with fistula and abscess.                                                                                                       Significant events:  7/20 flex sig: severe left sided colitis consistent with Crohn's colitis; flexi-seal removed  Today:   Glucose (goal CBG's <150)- 152-174 since start of TPN; also on IV steroids  Electrolytes - Na up 133, Phos 3.2 (s/p 10 mmol Na Phosphate on 7/20), Mag 2.5 (s/p 2 gm Mag sulfate on 7/20), K 4.3, CoCa 9.46  Renal -WNL  LFTs - wnl  TGs - pending  Prealbumin - pending   NUTRITIONAL GOALS                                                                                              RD recs:  Kcal: 1900-2100 Protein: 100-110 grams Fluid: 2.2-2.5 L/day  Clinimix E 5/15 at a goal rate of 83 ml/hr + 20% fat emulsion at 56ml/hr to provide: 100 g/day protein, 1894 Kcal/day. (per recom. from dietitian)  PLAN                                                                                                    At 1800 today:  Increase Clinimix E 5/15 to 66ml/hr.  Continue 20% fat emulsion at 78ml/hr.  Plan to advance as tolerated to the goal rate.  TPN to contain standard multivitamins and trace elements.  Reduce IVF to 10 ml/hr.  Continue moderate SSI q4h .   TPN lab panels on Mondays & Thursdays.  F/u daily.   Dia Sitter, PharmD, BCPS 09/07/2014 7:37 AM

## 2014-09-07 NOTE — Progress Notes (Signed)
Initial Nutrition Assessment  DOCUMENTATION CODES:   Obesity unspecified  INTERVENTION:  - Continue TPN per pharmacy - RD will continue to monitor for needs  NUTRITION DIAGNOSIS:   Altered GI function related to acute illness as evidenced by other (see comment) (fistula requiring TPN).  GOAL:   Patient will meet greater than or equal to 90% of their needs  MONITOR:   Weight trends, Labs, I & O's, Other (Comment) (TPN regimen)  REASON FOR ASSESSMENT:   Consult New TPN/TNA  ASSESSMENT:  74 y.o. female with past medical history of dyslipidemia, hypertension, long standing ulcerative colitis and recently hospitalized from 08/07/2014 through 08/21/2014 for ulcerative colitis flare. She had flexible sigmoidoscopy 08/08/2014 which demonstrated inflammatory changes from the rectum consistent with ulcerative colitis. She presented to [facility] long hospital with perianal pain started the night prior to the admission. CT scan on the admission demonstrated a complex perirectal infection extending up toward the retroperitoneal area.  Pt seen for new TPN. BMI indicates obesity. On 7/5 pt underwent I&D of large, R-sided rectal abscess and on 7/20 underwent flex sig which indicated significant L sided disease. Per [place] RN note 7/20, pt to undergo colostomy placement today (7/21).  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   Pt reports that during admission and PTA she had a good appetite with no recent changes. Chart review indicates 25-75% intakes mainly. She also reports stable weight PTA which is consistent with review of weight hx in chart.  She is currently receiving Clinimix E 5/15 @ 40 mL/hr with 20% lipids @ 10 mL/hr which is providing 48 grams protein and 1162 kcal; not meeting needs.   Goal rate per pharmacy: Clinimix E 5/15 @ 70 mL/hr with 20% lipids @ 10 mL/hr which will provide 84 grams protein and 1673 kcal which will not meet needs. Recommend increase to 83 mL/hr  which will provide 1894 kcal and 100 grams protein.  Pt denies abdominal pain or nausea at this time. No muscle or fat wasting noted. Medications reviewed. Labs reviewed; CBGs: 135 mg/dL, Na: 133 mmol/L, Cl: 97 mmol/L, Ca: 7.7 mg/dL, Mg: 2.5 mg/dL.   Diet Order:  Diet - low sodium heart healthy Diet NPO time specified TPN (CLINIMIX-E) Adult  Skin:  Wound (see comment) (perineum surgical incision)  Last BM:  7/20  Height:   Ht Readings from Last 1 Encounters:  08/22/14 5\' 9"  (1.753 m)    Weight:   Wt Readings from Last 1 Encounters:  08/23/14 229 lb 4.5 oz (104 kg)    Ideal Body Weight:  72.73 kg (kg)  Wt Readings from Last 10 Encounters:  08/23/14 229 lb 4.5 oz (104 kg)  08/07/14 229 lb 12.8 oz (104.237 kg)  08/03/14 227 lb 4 oz (103.08 kg)  07/22/14 220 lb (99.791 kg)  03/28/14 234 lb 1.6 oz (106.187 kg)  03/02/14 233 lb (105.688 kg)  05/17/13 224 lb 6.4 oz (101.787 kg)  03/04/13 223 lb (101.152 kg)  02/25/13 218 lb 8 oz (99.111 kg)  02/24/13 222 lb (100.699 kg)    BMI:  Body mass index is 33.84 kg/(m^2).  Estimated Nutritional Needs:   Kcal:  1900-2100  Protein:  100-110 grams  Fluid:  2.2-2.5 L/day  EDUCATION NEEDS:   No education needs identified at this time    Jarome Matin, RD, LDN Inpatient Clinical Dietitian Pager # [phone] After hours/weekend pager # [phone]

## 2014-09-07 NOTE — Progress Notes (Signed)
[facility] PROGRESS NOTE  [name] XBL:[id number] DOB: 07/10/1940 DOA: 08/22/2014 PCP: Kandice Hams, MD  Brief narrative: 74 y.o. female with past medical history of dyslipidemia, hypertension, long standing ulcerative colitis and recently hospitalized from 08/07/2014 through 08/21/2014 for ulcerative colitis flare. She had flexible sigmoidoscopy 08/08/2014 which demonstrated inflammatory changes from the rectum consistent with ulcerative colitis. She presented to [facility] long hospital with perianal pain started the night prior to the admission. CT scan on the admission demonstrated a complex perirectal infection extending up toward the retroperitoneal area.  08/21/13 underwent incision and drainage of the large issue rectal abscess on the right side. She was started on vancomycin and Zosyn. Postprocedure she was doing fine but has developed large amount of bleed on the morning of 08/23/2014. GI was consulted and pt was started on prednisone and mesalamine.  Hospital course is complicated with ongoing rectal bleed so Additionally, she was found to have left LE DVT On venous doppler done 7/12.   On 7/20, pt underwent flex sig with findings of significant L sided disease with GI recs for continued bowel rest, PO flagyl, increased steroids, and Remicade. The patient has since noted feeling improved.  Assessment/Plan: Sepsis secondary to peri-rectal abscess / leukocytosis  - Sepsis on admission with tachycardia, tachypnea, hypotension, leukocytosis. Source of infection is perirectal abscess. - S/P incision and drainage by surgery 08/23/2014 - Started on vancomycin and Zosyn at the time of the admission. Abscess culture demonstrated rare yeast and multiple other organisms/ none predominant - Blood cultures negative.  - Vancomycin was stopped 08/26/2014. Currently on Zosyn and fluconazole. - Dr. Megan Salon of infectious disease recommended total duration of antibiotics - 14 days with stop  date on 7/18- but due to stool in abscess cavity and possible fistula, abx duration to be deferred to Surgery. Surgery was originally planned for 7/21, but on hold for now given flex sig findings  Bloody diarrhea due to UC  - Patient had a rectal bleed after incision and drainage done 08/23/2014 which has continued -GI consulted and pt started on prednisone and mesalamine which was not effective in controlling diarrhea/ bleeding and therefore started on Remicaid 7/15 - Pt is now s/p flex sig with findings of significant disease. Discussed with GI with plans for continued bowel rest with immunomodulators and increased steroids  Acute blood loss anemia  - Hb 12 on admission and 7.2 on 7/18- transfused 1 U PRBC   Essential hypertension - SBP was elevated in 150- 160s-stopped Hydralzine and added Norvasc for ease of dosing and as she was slightly tachycardic- added toprol 50mg  - BP improved  B/L lower extremity DVT - Not a candidate for anticoagulation due to ongoing uncontrolled ulcerative colitis and rectal bleed - therefore, an IVC filter was placed   Hypokalemia/ hyponatremia - Likely from GI losses - sodium low today- continued on IVF   Generalized weakness  - Patient will be discharged to skilled nursing facility once medically stable.  Code Status: Full Family Communication: Pt in room, family at bedside Disposition Plan: Pending   Consultants:  GI  General Surgery  Procedures:  IVC filter   PICC line  venous duplex  Flex sig 7/20  Antibiotics: Fluconazole 7/8>>> Flagyl 7/19>>>  HPI/Subjective: Feels better today with less abd pain than yesterday  Objective: Filed Vitals:   09/06/14 1420 09/06/14 2206 09/07/14 0555 09/07/14 1447  BP: 129/61 127/81 113/62 136/65  Pulse: 86 86 79 85  Temp:  98 F (36.7 C) 98.7 F (37.1 C)  97.7 F (36.5 C)  TempSrc:  Oral Oral Oral  Resp: 9 10 14 16   Height:      Weight:      SpO2: 98% 100% 96% 96%     Intake/Output Summary (Last 24 hours) at 09/07/14 1506 Last data filed at 09/07/14 0507  Gross per 24 hour  Intake      0 ml  Output   2200 ml  Net  -2200 ml   Filed Weights   08/22/14 2113 08/23/14 0153 08/23/14 0434  Weight: 99.791 kg (220 lb) 102.3 kg (225 lb 8.5 oz) 104 kg (229 lb 4.5 oz)    Exam:   General:  Awake, laying in bed, in nad  Cardiovascular: regular, s1, s2  Respiratory: normal resp effort, no wheezing  Abdomen: soft, generally tender, decreased BS  Musculoskeletal: perfused, no clubbing, no cyanosis  Data Reviewed: Basic Metabolic Panel:  Recent Labs Lab 09/02/14 0431 09/04/14 1359 09/05/14 0445 09/06/14 0410 09/07/14 0438  NA 132* 130* 127* 127* 133*  K 3.4* 4.3 4.3 4.4 4.3  CL 93* 95* 94* 96* 97*  CO2 30 29 26 27 31   GLUCOSE 69 88 97 140* 155*  BUN 7 <5* 6 11 10   CREATININE 0.65 0.53 0.56 0.40* 0.46  CALCIUM 7.4* 7.4* 7.6* 7.7* 7.7*  MG  --   --   --  1.4* 2.5*  PHOS  --   --   --  2.6 3.2   Liver Function Tests:  Recent Labs Lab 09/07/14 0438  AST 29  ALT 19  ALKPHOS 108  BILITOT 0.3  PROT 5.1*  ALBUMIN 1.8*   No results for input(s): LIPASE, AMYLASE in the last 168 hours. No results for input(s): AMMONIA in the last 168 hours. CBC:  Recent Labs Lab 09/02/14 0431 09/04/14 0455 09/04/14 1617 09/05/14 0445 09/06/14 0410 09/07/14 0438  WBC 18.4* 14.4*  --  14.6* 14.9* 11.8*  NEUTROABS  --   --   --   --   --  10.6*  HGB 7.9* 7.2* 9.2* 9.3* 8.8* 8.2*  HCT 24.9* 22.9* 28.2* 28.3* 27.2* 26.1*  MCV 95.4 96.6  --  92.8 93.8 94.9  PLT 291 344  --  377 420* 362   Cardiac Enzymes: No results for input(s): CKTOTAL, CKMB, CKMBINDEX, TROPONINI in the last 168 hours. BNP (last 3 results) No results for input(s): BNP in the last 8760 hours.  ProBNP (last 3 results) No results for input(s): PROBNP in the last 8760 hours.  CBG:  Recent Labs Lab 09/06/14 2104 09/06/14 2329 09/07/14 0425 09/07/14 0824 09/07/14 1157   GLUCAP 135* 174* 152* 150* 145*    Recent Results (from the past 240 hour(s))  Clostridium Difficile by PCR (not at [facility])     Status: None   Collection Time: 08/28/14  7:11 PM  Result Value Ref Range Status   C difficile by pcr NEGATIVE NEGATIVE Final     Studies: No results found.  Scheduled Meds: . amLODipine  10 mg Oral Daily  . fluconazole (DIFLUCAN) IV  400 mg Intravenous Q24H  . insulin aspart  0-15 Units Subcutaneous 6 times per day  . lip balm  1 application Topical BID  . mesalamine  4.8 g Oral Q breakfast  . methylPREDNISolone (SOLU-MEDROL) injection  20 mg Intravenous 3 times per day  . metoprolol  2.5 mg Intravenous 4 times per day  . metronidazole  250 mg Intravenous Q8H  . nystatin   Topical BID  . saccharomyces boulardii  250 mg Oral BID  . sodium chloride  10-40 mL Intracatheter Q12H  . sodium chloride  3 mL Intravenous Q12H  . sodium chloride  3 mL Intravenous Q12H   Continuous Infusions: . Marland KitchenTPN (CLINIMIX-E) Adult     And  . fat emulsion    . sodium chloride 35 mL/hr at 09/07/14 0900  . sodium chloride    . Marland KitchenTPN (CLINIMIX-E) Adult 40 mL/hr at 09/06/14 1708   And  . fat emulsion 240 mL (09/06/14 1709)    Principal Problem:   Abscess, perirectal s/p I&D 08/23/2014 Active Problems:   Essential hypertension   Inflammatory bowel disease (IBD) with colitis   Sepsis   Infection due to yeast   Acute blood loss anemia   Leukocytosis   Hypokalemia   Hyponatremia   General weakness   Thrombocytopenia   Ulcerative colitis   DVT (deep venous thrombosis)   DVT (deep vein thrombosis) in pregnancy   Cold   Chablis Losh, [facility] Pager [phone]. If 7PM-7AM, please contact night-coverage at [email or web address], password [facility] 09/07/2014, 3:06 PM  LOS: 16 days

## 2014-09-07 NOTE — Care Management Important Message (Signed)
Important Message  Patient Details  Name: [name] MRN: [id number] Date of Birth: 1940-06-22   Medicare Important Message Given:  Yes-third notification given    Camillo Flaming 09/07/2014, 11:48 AMImportant Message  Patient Details  Name: [name] MRN: [id number] Date of Birth: May 17, 1940   Medicare Important Message Given:  Yes-third notification given    Camillo Flaming 09/07/2014, 11:48 AM

## 2014-09-07 NOTE — Progress Notes (Signed)
Date:  September 07, 2014 U.R. performed for needs and level of care. Will continue to follow for Case Management needs.  Rhonda Davis, RN, BSN, CCM   [phone] 

## 2014-09-07 NOTE — Progress Notes (Signed)
Patient ID: [name], female   DOB: [birth year], 74 y.o.   MRN: [id number] 1 Day Post-Op  Subjective: No specific complaints this morning. Less pain with dressing changes.  Objective: Vital signs in last 24 hours: Temp:  [98 F (36.7 C)-98.7 F (37.1 C)] 98.7 F (37.1 C) (07/21 0555) Pulse Rate:  [79-105] 79 (07/21 0555) Resp:  [9-19] 14 (07/21 0555) BP: (113-194)/(60-95) 113/62 mmHg (07/21 0555) SpO2:  [92 %-100 %] 96 % (07/21 0555) Last BM Date: 09/06/14  Intake/Output from previous day: 07/20 0701 - 07/21 0700 In: -  Out: 2750 [Urine:2750] Intake/Output this shift:    General appearance: alert, cooperative and no distress GI: normal findings: soft, non-tender  Rectal abscess just repacked and not reexamined today.  Lab Results:   Recent Labs  09/06/14 0410 09/07/14 0438  WBC 14.9* 11.8*  HGB 8.8* 8.2*  HCT 27.2* 26.1*  PLT 420* 362   BMET  Recent Labs  09/06/14 0410 09/07/14 0438  NA 127* 133*  K 4.4 4.3  CL 96* 97*  CO2 27 31  GLUCOSE 140* 155*  BUN 11 10  CREATININE 0.40* 0.46  CALCIUM 7.7* 7.7*     Studies/Results: No results found.  Anti-infectives: Anti-infectives    Start     Dose/Rate Route Frequency Ordered Stop   09/07/14 1200  fluconazole (DIFLUCAN) IVPB 400 mg     400 mg 100 mL/hr over 120 Minutes Intravenous Every 24 hours 09/06/14 2117     09/06/14 2200  metroNIDAZOLE (FLAGYL) IVPB 250 mg     250 mg 50 mL/hr over 60 Minutes Intravenous Every 8 hours 09/06/14 2117     09/05/14 1500  metroNIDAZOLE (FLAGYL) tablet 250 mg  Status:  Discontinued     250 mg Oral 3 times per day 09/05/14 1440 09/06/14 2117   08/28/14 0000  fluconazole (DIFLUCAN) 200 MG tablet     400 mg Oral Daily 08/28/14 1052     08/28/14 0000  amoxicillin-clavulanate (AUGMENTIN) 875-125 MG per tablet     1 tablet Oral 2 times daily 08/28/14 1052     08/25/14 2000  vancomycin (VANCOCIN) IVPB 1000 mg/200 mL premix  Status:  Discontinued     1,000 mg 200 mL/hr over  60 Minutes Intravenous Every 12 hours 08/25/14 1009 08/26/14 0949   08/25/14 1200  fluconazole (DIFLUCAN) tablet 400 mg  Status:  Discontinued     400 mg Oral Daily 08/25/14 0943 09/06/14 2117   08/23/14 1000  vancomycin (VANCOCIN) IVPB 750 mg/150 ml premix  Status:  Discontinued     750 mg 150 mL/hr over 60 Minutes Intravenous Every 12 hours 08/22/14 2127 08/25/14 1009   08/23/14 0600  piperacillin-tazobactam (ZOSYN) IVPB 3.375 g  Status:  Discontinued     3.375 g 12.5 mL/hr over 240 Minutes Intravenous 3 times per day 08/22/14 2124 09/05/14 1356   08/22/14 2130  piperacillin-tazobactam (ZOSYN) IVPB 3.375 g     3.375 g 100 mL/hr over 30 Minutes Intravenous NOW 08/22/14 2124 08/22/14 2220   08/22/14 2130  [MAR Hold]  vancomycin (VANCOCIN) 2,000 mg in sodium chloride 0.9 % 500 mL IVPB     (MAR Hold since 08/22/14 2338)   2,000 mg 250 mL/hr over 120 Minutes Intravenous NOW 08/22/14 2127 08/23/14 0008      Assessment/Plan: Large perirectal abscess. 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 Flexible sigmoidoscopy sigmoidoscopy by Dr. Olevia Perches yesterday consistent with severe Crohn's colitis. Diverting colostomy postponed due to concern over severe colitis. Bowel rest and steroids recommended by  Dr. Olevia Perches. Could require total colectomy instead of colostomy. Continue to follow with nonoperative management for now.    LOS: 16 days    Meaghann Choo T 09/07/2014

## 2014-09-07 NOTE — Care Management Note (Signed)
Case Management Note  Patient Details  Name: [name] MRN: [id number] Date of Birth: 1940-09-29  Subjective/Objective:                 Ulcerative crohn's dz   Action/Plan: Surgical intervention  Expected Discharge Date:         [id number]         Expected Discharge Plan:  [place]  In-House Referral:  NA  Discharge planning Services  CM Consult  Post Acute Care Choice:  NA Choice offered to:  NA  DME Arranged:  N/A DME Agency:  NA  HH Arranged:  NA HH Agency:  NA  Status of Service:  In process, will continue to follow  Medicare Important Message Given:  Yes-second notification given Date Medicare IM Given:    Medicare IM give by:    Date Additional Medicare IM Given:    Additional Medicare Important Message give by:     If discussed at [place] of Stay Meetings, dates discussed:  [id number]  Additional Comments:  Leeroy Cha, RN 09/07/2014, 10:55 AM

## 2014-09-08 DIAGNOSIS — M729 Fibroblastic disorder, unspecified: Secondary | ICD-10-CM

## 2014-09-08 LAB — CBC
HCT: 24.7 % — ABNORMAL LOW (ref 36.0–46.0)
Hemoglobin: 7.9 g/dL — ABNORMAL LOW (ref 12.0–15.0)
MCH: 30.9 pg (ref 26.0–34.0)
MCHC: 32 g/dL (ref 30.0–36.0)
MCV: 96.5 fL (ref 78.0–100.0)
PLATELETS: 363 10*3/uL (ref 150–400)
RBC: 2.56 MIL/uL — ABNORMAL LOW (ref 3.87–5.11)
RDW: 17.4 % — AB (ref 11.5–15.5)
WBC: 15.4 10*3/uL — ABNORMAL HIGH (ref 4.0–10.5)

## 2014-09-08 LAB — BASIC METABOLIC PANEL
Anion gap: 5 (ref 5–15)
BUN: 13 mg/dL (ref 6–20)
CO2: 31 mmol/L (ref 22–32)
Calcium: 7.7 mg/dL — ABNORMAL LOW (ref 8.9–10.3)
Chloride: 95 mmol/L — ABNORMAL LOW (ref 101–111)
Creatinine, Ser: 0.43 mg/dL — ABNORMAL LOW (ref 0.44–1.00)
GFR calc non Af Amer: >60 mL/min (ref >60)
Glucose, Bld: 176 mg/dL — ABNORMAL HIGH (ref 65–99)
Potassium: 4 mmol/L (ref 3.5–5.1)
Sodium: 131 mmol/L — ABNORMAL LOW (ref 135–145)

## 2014-09-08 LAB — GLUCOSE, CAPILLARY
GLUCOSE-CAPILLARY: 176 mg/dL — AB (ref 65–99)
Glucose-Capillary: 165 mg/dL — ABNORMAL HIGH (ref 65–99)
Glucose-Capillary: 169 mg/dL — ABNORMAL HIGH (ref 65–99)
Glucose-Capillary: 177 mg/dL — ABNORMAL HIGH (ref 65–99)
Glucose-Capillary: 179 mg/dL — ABNORMAL HIGH (ref 65–99)
Glucose-Capillary: 187 mg/dL — ABNORMAL HIGH (ref 65–99)
Glucose-Capillary: 188 mg/dL — ABNORMAL HIGH (ref 65–99)

## 2014-09-08 MED ORDER — TRACE MINERALS CR-CU-MN-SE-ZN 10-1000-500-60 MCG/ML IV SOLN
INTRAVENOUS | Status: AC
  Administered 2014-09-08: 18:00:00 via INTRAVENOUS
  Filled 2014-09-08: qty 1992

## 2014-09-08 MED ORDER — FAT EMULSION 20 % IV EMUL
240.0000 mL | INTRAVENOUS | Status: AC
  Administered 2014-09-08: 240 mL via INTRAVENOUS
  Filled 2014-09-08: qty 250

## 2014-09-08 NOTE — Progress Notes (Signed)
[place] NOTE  Pharmacy Consult for TPN Indication: severe IBD with fistula and abscess  Allergies  Allergen Reactions  . Clindamycin/Lincomycin Diarrhea  . Ivp Dye [Iodinated Diagnostic Agents] Other (See Comments)    "almost passed out"    Patient Measurements: Height: 5\' 9"  (175.3 cm) Weight: 229 lb 4.5 oz (104 kg) IBW/kg (Calculated) : 66.2 Adjusted Body Weight: 81.3kg  Vital Signs: Temp: 98.1 F (36.7 C) (07/22 0702) Temp Source: Oral (07/22 0702) BP: 120/72 mmHg (07/22 0702) Pulse Rate: 70 (07/22 0702) Intake/Output from previous day: 07/21 0701 - 07/22 0700 In: 760 [I.V.:360; TPN:400] Out: 650 [Urine:650] Intake/Output from this shift:    Labs:  Recent Labs  09/06/14 0410 09/07/14 0438 09/08/14 0433  WBC 14.9* 11.8* 15.4*  HGB 8.8* 8.2* 7.9*  HCT 27.2* 26.1* 24.7*  PLT 420* 362 363     Recent Labs  09/06/14 0410 09/07/14 0438 09/08/14 0433  NA 127* 133* 131*  K 4.4 4.3 4.0  CL 96* 97* 95*  CO2 27 31 31   GLUCOSE 140* 155* 176*  BUN 11 10 13   CREATININE 0.40* 0.46 0.43*  CALCIUM 7.7* 7.7* 7.7*  MG 1.4* 2.5*  --   PHOS 2.6 3.2  --   PROT  --  5.1*  --   ALBUMIN  --  1.8*  --   AST  --  29  --   ALT  --  19  --   ALKPHOS  --  108  --   BILITOT  --  0.3  --   PREALBUMIN  --  19.6  --   TRIG  --  128  --    Estimated Creatinine Clearance: 76.8 mL/min (by C-G formula based on Cr of 0.43).    Recent Labs  09/07/14 2025 09/08/14 09/08/14 0407  GLUCAP 179* 187* 176*    Medical History: Past Medical History  Diagnosis Date  . CAD (coronary artery disease)     unspecified  . Cerebrovascular disease   . Hyperlipidemia, mixed   . HTN (hypertension)   . Ulcerative colitis   . Diverticulitis   . Colon polyps 2006  . Thyroid disease   . Myocardial infarct   . Carotid artery occlusion 2010  . Diabetes mellitus without complication     Borderline  . GERD (gastroesophageal reflux disease)   . Arthritis      Medications:  Scheduled:  . amLODipine  10 mg Oral Daily  . fluconazole (DIFLUCAN) IV  400 mg Intravenous Q24H  . insulin aspart  0-15 Units Subcutaneous 6 times per day  . lip balm  1 application Topical BID  . mesalamine  4.8 g Oral Q breakfast  . methylPREDNISolone (SOLU-MEDROL) injection  20 mg Intravenous 3 times per day  . metoprolol  2.5 mg Intravenous 4 times per day  . metronidazole  250 mg Intravenous Q8H  . nystatin   Topical BID  . saccharomyces boulardii  250 mg Oral BID  . sodium chloride  10-40 mL Intracatheter Q12H  . sodium chloride  3 mL Intravenous Q12H  . sodium chloride  3 mL Intravenous Q12H   Insulin Requirements: no history of diabetes; on moderate SSI q4h; 15 units in past 24 hrs  Current Nutrition: NPO except sips of clear liquids  IVF: NS @ [place] access: PICC placed 08/30/14 TPN start date: 7/20  ASSESSMENT    74 y.o. female with past medical history of dyslipidemia, hypertension, long standing ulcerative colitis and recently hospitalized from 08/07/2014  through 08/21/2014 for ulcerative colitis flare.  She presented to [facility] with perianal pain started the night prior to the admission. 08/21/13 underwent incision and drainage of the large issue rectal abscess on the right side.  Further surgery (colectomy vs colostomy) postponed due to severe colitis.  Pharmacy consulted to start TPN for severe IBD with fistula and abscess.                                                                                                      Significant events:  7/20 flex sig: severe left sided colitis consistent with Crohn's colitis; flexi-seal removed 7/20: TPN started 7/22: TPN titrated to goal rate  Today:   Glucose (goal CBG's <150)- 152-187 since start of TPN; also on IV steroids  Electrolytes - Na 131, Phos 3.2 (s/p 10 mmol Na Phosphate on 7/20), Mag 2.5 (s/p 2 gm Mag sulfate on 7/20), K 4.0, Corrected Ca 9.46  Renal -WNL  LFTs -  wnl  TGs - 128  Prealbumin - 19.6  I/O not adequately recorded  NUTRITIONAL GOALS                                                                                             RD recs:  Kcal: 1900-2100 Protein: 100-110 grams Fluid: 2.2-2.5 L/day  Clinimix E 5/15 at a goal rate of 83 ml/hr + 20% fat emulsion at 79ml/hr to provide: 100 g/day protein, 1894 Kcal/day. (per recom. from dietitian)  PLAN                                                                                                    At 1800 today:  Increase Clinimix E 5/15 to 83 ml/hr.  Continue 20% fat emulsion at 93ml/hr.  TPN to contain standard multivitamins and trace elements.  Provide insulin 15 units/ 24hrs via TPN  Continue IVF at 10 ml/hr.  Continue moderate SSI q4h .   TPN lab panels on Mondays & Thursdays.  F/u daily.   Dia Sitter, PharmD, BCPS 09/08/2014 7:31 AM

## 2014-09-08 NOTE — Progress Notes (Signed)
Nutrition Follow-up  DOCUMENTATION CODES:   Obesity unspecified  INTERVENTION:  - Continue TPN per pharmacy - RD will continue to monitor for needs  NUTRITION DIAGNOSIS:   Altered GI function related to acute illness as evidenced by other (see comment) (fistula requiring TPN). -ongoing  GOAL:   Patient will meet greater than or equal to 90% of their needs -unmet  MONITOR:   Weight trends, Labs, I & O's, Other (Comment) (TPN regimen)  ASSESSMENT:  74 y.o. female with past medical history of dyslipidemia, hypertension, long standing ulcerative colitis and recently hospitalized from 08/07/2014 through 08/21/2014 for ulcerative colitis flare. She had flexible sigmoidoscopy 08/08/2014 which demonstrated inflammatory changes from the rectum consistent with ulcerative colitis. She presented to [facility] long hospital with perianal pain started the night prior to the admission. CT scan on the admission demonstrated a complex perirectal infection extending up toward the retroperitoneal area.  7/22 Pt currently receiving Clinimix E 5/15 @ 60 ml/hr with 20% lipids @ 10 mL/hr which is providing 1502 kcal, 72 grams of protein; not meeting needs.  Plan per pharmacy for today: At 1800 today:  Increase Clinimix E 5/15 to 83 ml/hr (this will provide 1894 kcal and 100 grams protein)  Continue 20% fat emulsion at 68ml/hr.  TPN to contain standard multivitamins and trace elements.  Provide insulin 15 units/ 24hrs via TPN  Continue IVF at 10 ml/hr.  Continue moderate SSI q4h .  TPN lab panels on Mondays & Thursdays.  Will meet needs with TPN reaching goal rate. Medications reviewed. Labs reviewed; CBGs: 132-187 mg/dL, Na: 131 mmol/L, Cl: 95 mmol/L, creatinine low, Ca: 7.7 mg/dL.   7/21 On 7/5 pt underwent I&D of large, R-sided rectal abscess and on 7/20 underwent flex sig which indicated significant L sided disease. Per [place] RN note 7/20, pt to undergo colostomy placement today  (7/21).  Goal rate per pharmacy: Clinimix E 5/15 @ 70 mL/hr with 20% lipids @ 10 mL/hr which will provide 84 grams protein and 1673 kcal which will not meet needs. Recommend increase to 83 mL/hr which will provide 1894 kcal and 100 grams protein.  Diet Order:  Diet - low sodium heart healthy Diet NPO time specified .TPN (CLINIMIX-E) Adult .TPN (CLINIMIX-E) Adult  Skin:  Wound (see comment) (perineum surgical incision)  Last BM:  7/21  Height:   Ht Readings from Last 1 Encounters:  08/22/14 5\' 9"  (1.753 m)    Weight:   Wt Readings from Last 1 Encounters:  08/23/14 229 lb 4.5 oz (104 kg)    Ideal Body Weight:  72.73 kg (kg)  Wt Readings from Last 10 Encounters:  08/23/14 229 lb 4.5 oz (104 kg)  08/07/14 229 lb 12.8 oz (104.237 kg)  08/03/14 227 lb 4 oz (103.08 kg)  07/22/14 220 lb (99.791 kg)  03/28/14 234 lb 1.6 oz (106.187 kg)  03/02/14 233 lb (105.688 kg)  05/17/13 224 lb 6.4 oz (101.787 kg)  03/04/13 223 lb (101.152 kg)  02/25/13 218 lb 8 oz (99.111 kg)  02/24/13 222 lb (100.699 kg)    BMI:  Body mass index is 33.84 kg/(m^2).  Estimated Nutritional Needs:   Kcal:  1900-2100  Protein:  100-110 grams  Fluid:  2.2-2.5 L/day  EDUCATION NEEDS:   No education needs identified at this time    Jarome Matin, RD, LDN Inpatient Clinical Dietitian Pager # [phone] After hours/weekend pager # [phone]

## 2014-09-08 NOTE — Progress Notes (Signed)
Clinical Social Work  Patient was discussed during progression meeting and MD reports patient is not medically stable to DC. CSW will continue to follow and will assist with transfer to SNF when more stable.  [place], [place] [phone]

## 2014-09-08 NOTE — Progress Notes (Signed)
[facility] PROGRESS NOTE  [name] MOQ:[id number] DOB: 05/31/1940 DOA: 08/22/2014 PCP: Kandice Hams, MD  Brief narrative: 74 y.o. female with past medical history of dyslipidemia, hypertension, long standing ulcerative colitis and recently hospitalized from 08/07/2014 through 08/21/2014 for ulcerative colitis flare. She had flexible sigmoidoscopy 08/08/2014 which demonstrated inflammatory changes from the rectum consistent with ulcerative colitis. She presented to [facility] with perianal pain started the night prior to the admission. CT scan on the admission demonstrated a complex perirectal infection extending up toward the retroperitoneal area.  08/21/13 underwent incision and drainage of the large issue rectal abscess on the right side. She was started on vancomycin and Zosyn. Postprocedure she was doing fine but has developed large amount of bleed on the morning of 08/23/2014. GI was consulted and pt was started on prednisone and mesalamine.  Hospital course is complicated with ongoing rectal bleed so Additionally, she was found to have left LE DVT On venous doppler done 7/12.   On 7/20, pt underwent flex sig with findings of significant L sided disease with GI recs for continued bowel rest, PO flagyl, increased steroids, and Remicade. The patient has since noted feeling improved.  Assessment/Plan: Sepsis secondary to peri-rectal abscess / leukocytosis  - Sepsis on admission with tachycardia, tachypnea, hypotension, leukocytosis. Source of infection is perirectal abscess. - S/P incision and drainage by surgery 08/23/2014 - Started on vancomycin and Zosyn at the time of the admission. Abscess culture demonstrated rare yeast and multiple other organisms/ none predominant - Blood cultures negative.  - Vancomycin was stopped 08/26/2014. Currently on Zosyn and fluconazole. - Dr. Megan Salon of infectious disease recommended total duration of antibiotics - 14 days with stop  date on 7/18- but due to stool in abscess cavity and possible fistula, abx duration to be deferred to Surgery. Surgery was originally planned for 7/21, but on hold for now given flex sig findings  Bloody diarrhea due to UC  - Patient had a rectal bleed after incision and drainage done 08/23/2014 which has continued -GI consulted and pt started on prednisone and mesalamine which was not effective in controlling diarrhea/ bleeding and therefore started on Remicaid 7/15 - Pt is now s/p flex sig with findings of significant disease. Discussed with GI with plans for continued bowel rest with immunomodulators and increased steroids - Pt reports improved symptoms today - Recommendations for possible total colectomy and ileostomy if no significant improvement  Acute blood loss anemia  - Hb 12 on admission and 7.2 on 7/18- transfused 1 U PRBC   Essential hypertension - SBP was elevated in 150- 160s-stopped Hydralzine and added Norvasc for ease of dosing and as she was slightly tachycardic- added toprol 50mg  - BP has improved  B/L lower extremity DVT - Not a candidate for anticoagulation due to ongoing uncontrolled ulcerative colitis and rectal bleed - therefore, an IVC filter was placed   Hypokalemia/ hyponatremia - Likely from GI losses - sodium low today- continued on IVF   Generalized weakness  - Patient will be discharged to skilled nursing facility once medically stable.  Code Status: Full Family Communication: Pt in room Disposition Plan: Pending   Consultants:  GI  General Surgery  Procedures:  IVC filter   PICC line  venous duplex  Flex sig 7/20  Antibiotics: Fluconazole 7/8>>> Flagyl 7/19>>>  HPI/Subjective: States feeling "fine." Abd pain is less today  Objective: Filed Vitals:   09/07/14 2030 09/08/14 0702 09/08/14 1253 09/08/14 1507  BP: 137/66 120/72 131/74 127/62  Pulse:  85 70 77 68  Temp: 99.3 F (37.4 C) 98.1 F (36.7 C) 97.6 F (36.4 C)  98.4 F (36.9 C)  TempSrc: Oral Oral Oral Oral  Resp:  14 18 20   Height:      Weight:      SpO2: 96% 92% 91% 91%    Intake/Output Summary (Last 24 hours) at 09/08/14 1758 Last data filed at 09/08/14 1044  Gross per 24 hour  Intake      0 ml  Output    375 ml  Net   -375 ml   Filed Weights   08/22/14 2113 08/23/14 0153 08/23/14 0434  Weight: 99.791 kg (220 lb) 102.3 kg (225 lb 8.5 oz) 104 kg (229 lb 4.5 oz)    Exam:   General:  Awake, laying in bed, in nad  Cardiovascular: regular, s1, s2  Respiratory: normal resp effort, no wheezing  Abdomen: soft, pos BS, mildly tender  Musculoskeletal: perfused, no clubbing, no cyanosis  Data Reviewed: Basic Metabolic Panel:  Recent Labs Lab 09/04/14 1359 09/05/14 0445 09/06/14 0410 09/07/14 0438 09/08/14 0433  NA 130* 127* 127* 133* 131*  K 4.3 4.3 4.4 4.3 4.0  CL 95* 94* 96* 97* 95*  CO2 29 26 27 31 31   GLUCOSE 88 97 140* 155* 176*  BUN <5* 6 11 10 13   CREATININE 0.53 0.56 0.40* 0.46 0.43*  CALCIUM 7.4* 7.6* 7.7* 7.7* 7.7*  MG  --   --  1.4* 2.5*  --   PHOS  --   --  2.6 3.2  --    Liver Function Tests:  Recent Labs Lab 09/07/14 0438  AST 29  ALT 19  ALKPHOS 108  BILITOT 0.3  PROT 5.1*  ALBUMIN 1.8*   No results for input(s): LIPASE, AMYLASE in the last 168 hours. No results for input(s): AMMONIA in the last 168 hours. CBC:  Recent Labs Lab 09/04/14 0455 09/04/14 1617 09/05/14 0445 09/06/14 0410 09/07/14 0438 09/08/14 0433  WBC 14.4*  --  14.6* 14.9* 11.8* 15.4*  NEUTROABS  --   --   --   --  10.6*  --   HGB 7.2* 9.2* 9.3* 8.8* 8.2* 7.9*  HCT 22.9* 28.2* 28.3* 27.2* 26.1* 24.7*  MCV 96.6  --  92.8 93.8 94.9 96.5  PLT 344  --  377 420* 362 363   Cardiac Enzymes: No results for input(s): CKTOTAL, CKMB, CKMBINDEX, TROPONINI in the last 168 hours. BNP (last 3 results) No results for input(s): BNP in the last 8760 hours.  ProBNP (last 3 results) No results for input(s): PROBNP in the last 8760  hours.  CBG:  Recent Labs Lab 09/08/14 09/08/14 0407 09/08/14 0743 09/08/14 1155 09/08/14 1617  GLUCAP 187* 176* 165* 177* 188*    No results found for this or any previous visit (from the past 240 hour(s)).   Studies: No results found.  Scheduled Meds: . amLODipine  10 mg Oral Daily  . fluconazole (DIFLUCAN) IV  400 mg Intravenous Q24H  . insulin aspart  0-15 Units Subcutaneous 6 times per day  . lip balm  1 application Topical BID  . mesalamine  4.8 g Oral Q breakfast  . methylPREDNISolone (SOLU-MEDROL) injection  20 mg Intravenous 3 times per day  . metoprolol  2.5 mg Intravenous 4 times per day  . metronidazole  250 mg Intravenous Q8H  . nystatin   Topical BID  . saccharomyces boulardii  250 mg Oral BID  . sodium chloride  10-40 mL Intracatheter  Q12H  . sodium chloride  3 mL Intravenous Q12H  . sodium chloride  3 mL Intravenous Q12H   Continuous Infusions: . Marland KitchenTPN (CLINIMIX-E) Adult Stopped (09/08/14 1743)   And  . fat emulsion Stopped (09/08/14 1743)  . Marland KitchenTPN (CLINIMIX-E) Adult 83 mL/hr at 09/08/14 1738   And  . fat emulsion 240 mL (09/08/14 1738)  . sodium chloride      Principal Problem:   Abscess, perirectal s/p I&D 08/23/2014 Active Problems:   Essential hypertension   Inflammatory bowel disease (IBD) with colitis   Sepsis   Infection due to yeast   Acute blood loss anemia   Leukocytosis   Hypokalemia   Hyponatremia   General weakness   Thrombocytopenia   Ulcerative colitis   DVT (deep venous thrombosis)   DVT (deep vein thrombosis) in pregnancy   Cold   [facility], [facility] Pager [phone]. If 7PM-7AM, please contact night-coverage at [email or web address], password [facility] 09/08/2014, 5:58 PM  LOS: 17 days

## 2014-09-08 NOTE — Progress Notes (Signed)
PT Cancellation Note  Patient Details Name: [name] MRN: [id number] DOB: [birth year]   Cancelled Treatment:     Pt declined stating she has a boil on her bottom that she fears will burst if she tries to get OOB.  Pt comfortable in bed and able to reposition herself some.   Nathanial Rancher 09/08/2014, 3:24 PM

## 2014-09-08 NOTE — Progress Notes (Signed)
     [facility] Progress Note  Subjective:  Next dose of Remicade due in one week, 7/29.  Having less BM's but still bloody with some clots.  Objective:  Vital signs in last 24 hours: Temp:  [97.7 F (36.5 C)-99.3 F (37.4 C)] 98.1 F (36.7 C) (07/22 0702) Pulse Rate:  [70-85] 70 (07/22 0702) Resp:  [14-16] 14 (07/22 0702) BP: (120-137)/(65-72) 120/72 mmHg (07/22 0702) SpO2:  [92 %-96 %] 92 % (07/22 0702) Last BM Date: 09/07/14 General:  Alert, Well-developed, in NAD Heart:  Regular rate and rhythm; no murmurs Pulm:  CTAB.  No W/R/R. Abdomen:  Soft, non-distended.  BS present.  Non-tender. Extremities:  Without edema. Neurologic:  Alert and  oriented x4;  grossly normal neurologically. Psych:  Alert and cooperative. Normal mood and affect.  Intake/Output from previous day: 07/21 0701 - 07/22 0700 In: 760 [I.V.:360; TPN:400] Out: 650 [Urine:650]   Lab Results:  Recent Labs  09/06/14 0410 09/07/14 0438 09/08/14 0433  WBC 14.9* 11.8* 15.4*  HGB 8.8* 8.2* 7.9*  HCT 27.2* 26.1* 24.7*  PLT 420* 362 363   BMET  Recent Labs  09/06/14 0410 09/07/14 0438 09/08/14 0433  NA 127* 133* 131*  K 4.4 4.3 4.0  CL 96* 97* 95*  CO2 27 31 31   GLUCOSE 140* 155* 176*  BUN 11 10 13   CREATININE 0.40* 0.46 0.43*  CALCIUM 7.7* 7.7* 7.7*   LFT  Recent Labs  09/07/14 0438  PROT 5.1*  ALBUMIN 1.8*  AST 29  ALT 19  ALKPHOS 108  BILITOT 0.3   Assessment / Plan: 1) IBD with severe active colitis: Received her first dose of Remicade on 7/15 and is on Lialda 4.8 grams daily. Solumedrol 10 mg IV BID added 7/18 with surgery's approval and then increased to 20 mg TID on 7/20. Flagyl 250 mg TID added 7/19. 2) Perirectal abscess-POD #15 from I&D. Finished with 14 day course of Zosyn per ID. Now large concern for fistula (? Crohn's disease rather than UC). Plans for diverting colostomy cancelled after flex sig on 7/20 showed severe colitis throughout the left  colon. 3) B/L LE DVT's-IVC filter in place. 4) Acute blood loss anemia: Received one unit PRBC's 7/18 for Hgb of 7.2 grams; Hgb drifting slowly, but GI bleeding has slowed overall. 5) Malnutrition: Placed on bowel rest with TNA 7/20.   LOS: 17 days   ZEHR, JESSICA D.  09/08/2014, 9:28 AM  Pager number [phone] Attending MD note:   I have taken a history, examined the patient, and reviewed the chart. I agree with the Advanced Practitioner's impression and recommendations. Discussed with Dr Excell Seltzer, I think we should consider total colectomy and ileostomy if she does not make rapid turn around.  [facility] Pager # [phone]

## 2014-09-08 NOTE — Progress Notes (Signed)
[facility] Progress Note  2 Days Post-Op  Subjective: Pt frustrated and sad about not having procedure, but understands why.  Continues to have almost continuous bloody BM's.  Dressings being changed frequently.    Objective: Vital signs in last 24 hours: Temp:  [97.7 F (36.5 C)-99.3 F (37.4 C)] 98.1 F (36.7 C) (07/22 0702) Pulse Rate:  [70-85] 70 (07/22 0702) Resp:  [14-16] 14 (07/22 0702) BP: (120-137)/(65-72) 120/72 mmHg (07/22 0702) SpO2:  [92 %-96 %] 92 % (07/22 0702) Last BM Date: 09/07/14  Intake/Output from previous day: 07/21 0701 - 07/22 0700 In: 760 [I.V.:360; TPN:400] Out: 650 [Urine:650] Intake/Output this shift:    PE: Gen:  Alert, NAD, pleasant Abd: Soft, NT/ND, +BS Perirectal wound:  Large bloody BM with clots completely saturating dressing, wound tender, no induration or fluctuance   Lab Results:   Recent Labs  09/07/14 0438 09/08/14 0433  WBC 11.8* 15.4*  HGB 8.2* 7.9*  HCT 26.1* 24.7*  PLT 362 363   BMET  Recent Labs  09/07/14 0438 09/08/14 0433  NA 133* 131*  K 4.3 4.0  CL 97* 95*  CO2 31 31  GLUCOSE 155* 176*  BUN 10 13  CREATININE 0.46 0.43*  CALCIUM 7.7* 7.7*   PT/INR No results for input(s): LABPROT, INR in the last 72 hours. CMP     Component Value Date/Time   NA 131* 09/08/2014 0433   K 4.0 09/08/2014 0433   CL 95* 09/08/2014 0433   CO2 31 09/08/2014 0433   GLUCOSE 176* 09/08/2014 0433   BUN 13 09/08/2014 0433   CREATININE 0.43* 09/08/2014 0433   CREATININE 1.08 03/28/2014 1022   CALCIUM 7.7* 09/08/2014 0433   PROT 5.1* 09/07/2014 0438   ALBUMIN 1.8* 09/07/2014 0438   AST 29 09/07/2014 0438   ALT 19 09/07/2014 0438   ALKPHOS 108 09/07/2014 0438   BILITOT 0.3 09/07/2014 0438   GFRNONAA >60 09/08/2014 0433   GFRNONAA 50* 03/28/2014 1022   GFRAA >60 09/08/2014 0433   GFRAA 58* 03/28/2014 1022   Lipase     Component Value Date/Time   LIPASE 13* 08/07/2014 1201       Studies/Results: No  results found.  Anti-infectives: Anti-infectives    Start     Dose/Rate Route Frequency Ordered Stop   09/07/14 1200  fluconazole (DIFLUCAN) IVPB 400 mg     400 mg 100 mL/hr over 120 Minutes Intravenous Every 24 hours 09/06/14 2117     09/06/14 2200  metroNIDAZOLE (FLAGYL) IVPB 250 mg     250 mg 50 mL/hr over 60 Minutes Intravenous Every 8 hours 09/06/14 2117     09/05/14 1500  metroNIDAZOLE (FLAGYL) tablet 250 mg  Status:  Discontinued     250 mg Oral 3 times per day 09/05/14 1440 09/06/14 2117   08/28/14 0000  fluconazole (DIFLUCAN) 200 MG tablet     400 mg Oral Daily 08/28/14 1052     08/28/14 0000  amoxicillin-clavulanate (AUGMENTIN) 875-125 MG per tablet     1 tablet Oral 2 times daily 08/28/14 1052     08/25/14 2000  vancomycin (VANCOCIN) IVPB 1000 mg/200 mL premix  Status:  Discontinued     1,000 mg 200 mL/hr over 60 Minutes Intravenous Every 12 hours 08/25/14 1009 08/26/14 0949   08/25/14 1200  fluconazole (DIFLUCAN) tablet 400 mg  Status:  Discontinued     400 mg Oral Daily 08/25/14 0943 09/06/14 2117   08/23/14 1000  vancomycin (VANCOCIN) IVPB 750 mg/150 ml premix  Status:  Discontinued     750 mg 150 mL/hr over 60 Minutes Intravenous Every 12 hours 08/22/14 2127 08/25/14 1009   08/23/14 0600  piperacillin-tazobactam (ZOSYN) IVPB 3.375 g  Status:  Discontinued     3.375 g 12.5 mL/hr over 240 Minutes Intravenous 3 times per day 08/22/14 2124 09/05/14 1356   08/22/14 2130  piperacillin-tazobactam (ZOSYN) IVPB 3.375 g     3.375 g 100 mL/hr over 30 Minutes Intravenous NOW 08/22/14 2124 08/22/14 2220   08/22/14 2130  [MAR Hold]  vancomycin (VANCOCIN) 2,000 mg in sodium chloride 0.9 % 500 mL IVPB     (MAR Hold since 08/22/14 2338)   2,000 mg 250 mL/hr over 120 Minutes Intravenous NOW 08/22/14 2127 08/23/14 0008       Assessment/Plan Ulcerative colitis with severe active colitis (as seen on flex sig) -Per GI started on Remicade and steroids, still having diarrhea and  bleeding -GI has some concern this may be Crohns instead of UC given fistula POD #16 I&D perirectal abscess---Dr. Zella Richer Perirectal fistula -Plan for lap diverting colostomy cancelled secondary to flex sig on 7/20 showing severe active colitis up to the transverse colon -Made NPO, started on TPN, bowel rest -Continue QID wet to dry dressing changes (with kerlix gauze) and gentle irrigation of wound -Air mattress in place -Leukocytosis 15.4 today, ? steroids -Mobilize, turning to avoid further skin breakdown, IS ABL Anemia -Given 1 unit of pRBC 09/03/14 -Hgb down to 7.9 today -Large bloody BM's with clots ID-zosyn D 14/14, now discontinued, started on flagyl per GI New DVT s/p IVC filter  -Holding pharm DVT proph due to anemia, but on SCD's    LOS: 17 days    Nat Christen 09/08/2014, 9:41 AM Pager: [phone]

## 2014-09-09 LAB — CBC
HCT: 24 % — ABNORMAL LOW (ref 36.0–46.0)
Hemoglobin: 7.7 g/dL — ABNORMAL LOW (ref 12.0–15.0)
MCH: 30.8 pg (ref 26.0–34.0)
MCHC: 32.1 g/dL (ref 30.0–36.0)
MCV: 96 fL (ref 78.0–100.0)
PLATELETS: 378 10*3/uL (ref 150–400)
RBC: 2.5 MIL/uL — ABNORMAL LOW (ref 3.87–5.11)
RDW: 17.3 % — AB (ref 11.5–15.5)
WBC: 20.6 10*3/uL — AB (ref 4.0–10.5)

## 2014-09-09 LAB — BASIC METABOLIC PANEL
Anion gap: 7 (ref 5–15)
BUN: 18 mg/dL (ref 6–20)
CO2: 31 mmol/L (ref 22–32)
Calcium: 8 mg/dL — ABNORMAL LOW (ref 8.9–10.3)
Chloride: 93 mmol/L — ABNORMAL LOW (ref 101–111)
Creatinine, Ser: 0.45 mg/dL (ref 0.44–1.00)
GLUCOSE: 176 mg/dL — AB (ref 65–99)
Potassium: 4.2 mmol/L (ref 3.5–5.1)
Sodium: 131 mmol/L — ABNORMAL LOW (ref 135–145)

## 2014-09-09 LAB — GLUCOSE, CAPILLARY
GLUCOSE-CAPILLARY: 162 mg/dL — AB (ref 65–99)
GLUCOSE-CAPILLARY: 173 mg/dL — AB (ref 65–99)
Glucose-Capillary: 192 mg/dL — ABNORMAL HIGH (ref 65–99)
Glucose-Capillary: 184 mg/dL — ABNORMAL HIGH (ref 65–99)
Glucose-Capillary: 174 mg/dL — ABNORMAL HIGH (ref 65–99)
Glucose-Capillary: 194 mg/dL — ABNORMAL HIGH (ref 65–99)

## 2014-09-09 MED ORDER — TRACE MINERALS CR-CU-MN-SE-ZN 10-1000-500-60 MCG/ML IV SOLN
INTRAVENOUS | Status: AC
  Administered 2014-09-09: 18:00:00 via INTRAVENOUS
  Filled 2014-09-09: qty 1992

## 2014-09-09 MED ORDER — FAT EMULSION 20 % IV EMUL
240.0000 mL | INTRAVENOUS | Status: AC
  Administered 2014-09-09: 240 mL via INTRAVENOUS
  Filled 2014-09-09: qty 250

## 2014-09-09 NOTE — Progress Notes (Signed)
[name] NOTE  Pharmacy Consult for TPN Indication: severe IBD with fistula and abscess  Allergies  Allergen Reactions  . Clindamycin/Lincomycin Diarrhea    Leads to colitis flare  . Ivp Dye [Iodinated Diagnostic Agents] Other (See Comments)    "almost passed out"    Patient Measurements: Height: 5\' 9"  (175.3 cm) Weight: 229 lb 4.5 oz (104 kg) IBW/kg (Calculated) : 66.2 Adjusted Body Weight: 81.3kg  Vital Signs: Temp: 98.3 F (36.8 C) (07/23 0536) Temp Source: Oral (07/23 0536) BP: 134/63 mmHg (07/23 0536) Pulse Rate: 67 (07/23 0536) Intake/Output from previous day: 07/22 0701 - 07/23 0700 In: 10 [I.V.:10] Out: 1375 [Urine:1375] Intake/Output from this shift:    Labs:  Recent Labs  09/07/14 0438 09/08/14 0433 09/09/14 0500  WBC 11.8* 15.4* 20.6*  HGB 8.2* 7.9* 7.7*  HCT 26.1* 24.7* 24.0*  PLT 362 363 378     Recent Labs  09/07/14 0438 09/08/14 0433 09/09/14 0500  NA 133* 131* 131*  K 4.3 4.0 4.2  CL 97* 95* 93*  CO2 31 31 31   GLUCOSE 155* 176* 176*  BUN 10 13 18   CREATININE 0.46 0.43* 0.45  CALCIUM 7.7* 7.7* 8.0*  MG 2.5*  --   --   PHOS 3.2  --   --   PROT 5.1*  --   --   ALBUMIN 1.8*  --   --   AST 29  --   --   ALT 19  --   --   ALKPHOS 108  --   --   BILITOT 0.3  --   --   PREALBUMIN 19.6  --   --   TRIG 128  --   --    Estimated Creatinine Clearance: 76.8 mL/min (by C-G formula based on Cr of 0.45).    Recent Labs  09/09/14 0007 09/09/14 0431 09/09/14 0729  GLUCAP 173* 192* 184*     Medications: Continuous Infusions: . Marland KitchenTPN (CLINIMIX-E) Adult 83 mL/hr at 09/08/14 1738   And  . fat emulsion 240 mL (09/08/14 1738)  . sodium chloride     Insulin Requirements:  Moderate SSI: 18 units / 24 hrs Insulin in TPN: 15 units  Current Nutrition:  Diet: NPO TPN @ 83 ml/hr, Lipids @ 10 ml/hr IVF: NS @ KVO   Central access: PICC placed 08/30/14 TPN start date:  7/20  ASSESSMENT                                                                                                           HPI:  74 y.o. female with past medical history of dyslipidemia, hypertension, long standing ulcerative colitis and recently hospitalized from 08/07/2014 through 08/21/2014 for ulcerative colitis flare.  She presented to [facility] 7/5 for readmission d/t perianal pain.  S/p I&D on 08/21/13 of the large issue rectal abscess on the right side.  Further surgery (colectomy vs colostomy) postponed due to severe colitis.  Pharmacy consulted to start TPN for severe IBD with fistula and abscess.  Significant events:  7/20 flex sig:  severe left sided colitis consistent with Crohn's colitis; flexi-seal removed 7/20: TPN started 7/22: TPN titrated to goal rate  Today:    Glucose (goal CBG's <150):  no history of diabetes; CBGs elevated ([id number]), ?related to ongoing steroids.  Insulin added to TPN on 7/22 PM and covered with SSI.  Electrolytes:  K, CorrCa 9.76 are WNL.  Na, Cl remain low.  Renal:  SCr 0.45  LFTs:  WNL (7/21)  TGs: 128 (7/21)  Prealbumin: 19.6 (7/21)  NUTRITIONAL GOALS                                                                                             RD recs (7/22): 1900-2100 Kcal/day, 100-110 g protein / day Clinimix E 5/15 at a goal rate of 83 ml/hr + 20% fat emulsion at 14ml/hr to provide: 100 g/day protein, 1894 Kcal/day.   PLAN                                                                                                                         At 1800 today:  Continue Clinimix E 5/15 at 83 ml/hr.  Insulin regular, increase to 20 units / 24 hours  TPN to contain standard multivitamins and trace elements.  20% fat emulsion at 10 ml/hr.  Continue IVF at [facility].  Continue CBGs and moderate SSI q6h  TPN lab panels on Mondays & Thursdays.  F/u daily.   Gretta Arab PharmD, BCPS Pager [phone] 09/09/2014 9:08 AM

## 2014-09-09 NOTE — Progress Notes (Signed)
3 Days Post-Op  Subjective: No complaints.  Objective: Vital signs in last 24 hours: Temp:  [97.6 F (36.4 C)-98.4 F (36.9 C)] 98.3 F (36.8 C) (07/23 0536) Pulse Rate:  [57-77] 67 (07/23 0536) Resp:  [18-20] 18 (07/23 0536) BP: (127-144)/(61-74) 134/63 mmHg (07/23 0536) SpO2:  [91 %-99 %] 99 % (07/23 0536) Last BM Date: 09/07/14  Intake/Output from previous day: 07/22 0701 - 07/23 0700 In: 10 [I.V.:10] Out: 1375 [Urine:1375] Intake/Output this shift:    PE: General- In NAD Anorectal-large anorectal wound with stool output  Lab Results:   Recent Labs  09/08/14 0433 09/09/14 0500  WBC 15.4* 20.6*  HGB 7.9* 7.7*  HCT 24.7* 24.0*  PLT 363 378   BMET  Recent Labs  09/08/14 0433 09/09/14 0500  NA 131* 131*  K 4.0 4.2  CL 95* 93*  CO2 31 31  GLUCOSE 176* 176*  BUN 13 18  CREATININE 0.43* 0.45  CALCIUM 7.7* 8.0*   PT/INR No results for input(s): LABPROT, INR in the last 72 hours. Comprehensive Metabolic Panel:    Component Value Date/Time   NA 131* 09/09/2014 0500   NA 131* 09/08/2014 0433   K 4.2 09/09/2014 0500   K 4.0 09/08/2014 0433   CL 93* 09/09/2014 0500   CL 95* 09/08/2014 0433   CO2 31 09/09/2014 0500   CO2 31 09/08/2014 0433   BUN 18 09/09/2014 0500   BUN 13 09/08/2014 0433   CREATININE 0.45 09/09/2014 0500   CREATININE 0.43* 09/08/2014 0433   CREATININE 1.08 03/28/2014 1022   GLUCOSE 176* 09/09/2014 0500   GLUCOSE 176* 09/08/2014 0433   CALCIUM 8.0* 09/09/2014 0500   CALCIUM 7.7* 09/08/2014 0433   AST 29 09/07/2014 0438   AST 15 08/23/2014 0345   ALT 19 09/07/2014 0438   ALT 13* 08/23/2014 0345   ALKPHOS 108 09/07/2014 0438   ALKPHOS 46 08/23/2014 0345   BILITOT 0.3 09/07/2014 0438   BILITOT 0.7 08/23/2014 0345   PROT 5.1* 09/07/2014 0438   PROT 4.9* 08/23/2014 0345   ALBUMIN 1.8* 09/07/2014 0438   ALBUMIN 2.0* 08/23/2014 0345     Studies/Results: No results found.  Anti-infectives: Anti-infectives    Start      Dose/Rate Route Frequency Ordered Stop   09/07/14 1200  fluconazole (DIFLUCAN) IVPB 400 mg     400 mg 100 mL/hr over 120 Minutes Intravenous Every 24 hours 09/06/14 2117     09/06/14 2200  metroNIDAZOLE (FLAGYL) IVPB 250 mg     250 mg 50 mL/hr over 60 Minutes Intravenous Every 8 hours 09/06/14 2117     09/05/14 1500  metroNIDAZOLE (FLAGYL) tablet 250 mg  Status:  Discontinued     250 mg Oral 3 times per day 09/05/14 1440 09/06/14 2117   08/28/14 0000  fluconazole (DIFLUCAN) 200 MG tablet     400 mg Oral Daily 08/28/14 1052     08/28/14 0000  amoxicillin-clavulanate (AUGMENTIN) 875-125 MG per tablet     1 tablet Oral 2 times daily 08/28/14 1052     08/25/14 2000  vancomycin (VANCOCIN) IVPB 1000 mg/200 mL premix  Status:  Discontinued     1,000 mg 200 mL/hr over 60 Minutes Intravenous Every 12 hours 08/25/14 1009 08/26/14 0949   08/25/14 1200  fluconazole (DIFLUCAN) tablet 400 mg  Status:  Discontinued     400 mg Oral Daily 08/25/14 0943 09/06/14 2117   08/23/14 1000  vancomycin (VANCOCIN) IVPB 750 mg/150 ml premix  Status:  Discontinued  750 mg 150 mL/hr over 60 Minutes Intravenous Every 12 hours 08/22/14 2127 08/25/14 1009   08/23/14 0600  piperacillin-tazobactam (ZOSYN) IVPB 3.375 g  Status:  Discontinued     3.375 g 12.5 mL/hr over 240 Minutes Intravenous 3 times per day 08/22/14 2124 09/05/14 1356   08/22/14 2130  piperacillin-tazobactam (ZOSYN) IVPB 3.375 g     3.375 g 100 mL/hr over 30 Minutes Intravenous NOW 08/22/14 2124 08/22/14 2220   08/22/14 2130  [MAR Hold]  vancomycin (VANCOCIN) 2,000 mg in sodium chloride 0.9 % 500 mL IVPB     (MAR Hold since 08/22/14 2338)   2,000 mg 250 mL/hr over 120 Minutes Intravenous NOW 08/22/14 2127 08/23/14 0008      Assessment Large perirectal abscess and now fistula Severe colitis-UC vs Crohns; on immunosuppressives   LOS: 18 days   Plan: Continue wound care.  If medical treatment does not help with colitis, may need abdominal  colectomy and ileostomy.   Carilyn Woolston Lenna Sciara 09/09/2014

## 2014-09-09 NOTE — Progress Notes (Signed)
Status quo, no complaints, Hgb down. Awaiting decision as to possible total colectomy for severe colitis with rectal abcess.  Continue sterois, bowl rest, Mesalamine, Flagy;, Remicade induction.

## 2014-09-09 NOTE — Progress Notes (Signed)
[facility] PROGRESS NOTE  [name] YQM:[id number] DOB: Aug 07, 1940 DOA: 08/22/2014 PCP: Kandice Hams, MD  Brief narrative: 74 y.o. female with past medical history of dyslipidemia, hypertension, long standing ulcerative colitis and recently hospitalized from 08/07/2014 through 08/21/2014 for ulcerative colitis flare. She had flexible sigmoidoscopy 08/08/2014 which demonstrated inflammatory changes from the rectum consistent with ulcerative colitis. She presented to [facility] long hospital with perianal pain started the night prior to the admission. CT scan on the admission demonstrated a complex perirectal infection extending up toward the retroperitoneal area.  08/21/13 underwent incision and drainage of the large issue rectal abscess on the right side. She was started on vancomycin and Zosyn. Postprocedure she was doing fine but has developed large amount of bleed on the morning of 08/23/2014. GI was consulted and pt was started on prednisone and mesalamine.  Hospital course is complicated with ongoing rectal bleed so Additionally, she was found to have left LE DVT On venous doppler done 7/12.   On 7/20, pt underwent flex sig with findings of significant L sided disease with GI recs for continued bowel rest, PO flagyl, increased steroids, and Remicade. The patient has since noted feeling improved.  Assessment/Plan: Sepsis secondary to peri-rectal abscess / leukocytosis  - Sepsis on admission with tachycardia, tachypnea, hypotension, leukocytosis. Source of infection is perirectal abscess. - S/P incision and drainage by surgery 08/23/2014 - Started on vancomycin and Zosyn at the time of the admission. Abscess culture demonstrated rare yeast and multiple other organisms/ none predominant - Blood cultures negative.  - Vancomycin was stopped 08/26/2014. Currently on Zosyn and fluconazole. - Dr. Megan Salon of infectious disease recommended total duration of antibiotics - 14 days with stop  date on 7/18- but due to stool in abscess cavity and possible fistula, abx duration to be deferred to Surgery. Surgery was originally planned for 7/21, but remains on hold for now given recent flex sig findings of marked inflammatory disease  Bloody diarrhea due to UC  - Patient had a rectal bleed after incision and drainage done 08/23/2014 which has continued -GI consulted and pt started on prednisone and mesalamine which was not effective in controlling diarrhea/ bleeding and therefore started on Remicaid 7/15 - Pt is now s/p flex sig with findings of significant disease. Discussed with GI with plans for continued bowel rest with immunomodulators and increased steroids - Pt reports improved symptoms today - Recommendations for possible total colectomy and ileostomy if no significant improvement with medical management  Acute blood loss anemia  - Hb 12 on admission and 7.2 on 7/18- transfused 1 U PRBC   Essential hypertension - SBP was elevated in 150- 160s-stopped Hydralzine and added Norvasc for ease of dosing and as she was slightly tachycardic- added toprol 50mg  - BP has improved  B/L lower extremity DVT - Not a candidate for anticoagulation due to ongoing uncontrolled ulcerative colitis and rectal bleed - therefore, an IVC filter was placed   Hypokalemia/ hyponatremia - Likely from GI losses - sodium low today- continued on IVF   Generalized weakness  - Patient will be discharged to skilled nursing facility once medically stable.  Code Status: Full Family Communication: Pt in room Disposition Plan: Pending   Consultants:  GI  General Surgery  Procedures:  IVC filter   PICC line  venous duplex  Flex sig 7/20  Antibiotics: Fluconazole 7/8>>> Flagyl 7/19>>>  HPI/Subjective: No complaints. States "I feel good."  Objective: Filed Vitals:   09/08/14 2116 09/09/14 0018 09/09/14 0536 09/09/14 1455  BP: 134/61 144/67 134/63 126/81  Pulse: 57 74 67 65   Temp: 98.2 F (36.8 C)  98.3 F (36.8 C) 97.5 F (36.4 C)  TempSrc: Oral  Oral Axillary  Resp: 18  18 16   Height:      Weight:      SpO2: 96%  99% 96%    Intake/Output Summary (Last 24 hours) at 09/09/14 1707 Last data filed at 09/09/14 1500  Gross per 24 hour  Intake     10 ml  Output   2550 ml  Net  -2540 ml   Filed Weights   08/22/14 2113 08/23/14 0153 08/23/14 0434  Weight: 99.791 kg (220 lb) 102.3 kg (225 lb 8.5 oz) 104 kg (229 lb 4.5 oz)    Exam:   General:  Asleep, easily arousable, laying in bed, in nad  Cardiovascular: regular, s1, s2  Respiratory: normal resp effort, no wheezing  Abdomen: soft, pos BS, mildly tender  Musculoskeletal: perfused, no clubbing  Data Reviewed: Basic Metabolic Panel:  Recent Labs Lab 09/05/14 0445 09/06/14 0410 09/07/14 0438 09/08/14 0433 09/09/14 0500  NA 127* 127* 133* 131* 131*  K 4.3 4.4 4.3 4.0 4.2  CL 94* 96* 97* 95* 93*  CO2 26 27 31 31 31   GLUCOSE 97 140* 155* 176* 176*  BUN 6 11 10 13 18   CREATININE 0.56 0.40* 0.46 0.43* 0.45  CALCIUM 7.6* 7.7* 7.7* 7.7* 8.0*  MG  --  1.4* 2.5*  --   --   PHOS  --  2.6 3.2  --   --    Liver Function Tests:  Recent Labs Lab 09/07/14 0438  AST 29  ALT 19  ALKPHOS 108  BILITOT 0.3  PROT 5.1*  ALBUMIN 1.8*   No results for input(s): LIPASE, AMYLASE in the last 168 hours. No results for input(s): AMMONIA in the last 168 hours. CBC:  Recent Labs Lab 09/05/14 0445 09/06/14 0410 09/07/14 0438 09/08/14 0433 09/09/14 0500  WBC 14.6* 14.9* 11.8* 15.4* 20.6*  NEUTROABS  --   --  10.6*  --   --   HGB 9.3* 8.8* 8.2* 7.9* 7.7*  HCT 28.3* 27.2* 26.1* 24.7* 24.0*  MCV 92.8 93.8 94.9 96.5 96.0  PLT 377 420* 362 363 378   Cardiac Enzymes: No results for input(s): CKTOTAL, CKMB, CKMBINDEX, TROPONINI in the last 168 hours. BNP (last 3 results) No results for input(s): BNP in the last 8760 hours.  ProBNP (last 3 results) No results for input(s): PROBNP in the last  8760 hours.  CBG:  Recent Labs Lab 09/08/14 2002 09/09/14 0007 09/09/14 0431 09/09/14 0729 09/09/14 1119  GLUCAP 169* 173* 192* 184* 162*    No results found for this or any previous visit (from the past 240 hour(s)).   Studies: No results found.  Scheduled Meds: . amLODipine  10 mg Oral Daily  . fluconazole (DIFLUCAN) IV  400 mg Intravenous Q24H  . insulin aspart  0-15 Units Subcutaneous 6 times per day  . lip balm  1 application Topical BID  . mesalamine  4.8 g Oral Q breakfast  . methylPREDNISolone (SOLU-MEDROL) injection  20 mg Intravenous 3 times per day  . metoprolol  2.5 mg Intravenous 4 times per day  . metronidazole  250 mg Intravenous Q8H  . nystatin   Topical BID  . saccharomyces boulardii  250 mg Oral BID  . sodium chloride  10-40 mL Intracatheter Q12H  . sodium chloride  3 mL Intravenous Q12H  . sodium chloride  3 mL Intravenous Q12H   Continuous Infusions: . Marland KitchenTPN (CLINIMIX-E) Adult 83 mL/hr at 09/08/14 1738   And  . fat emulsion 240 mL (09/08/14 1738)  . Marland KitchenTPN (CLINIMIX-E) Adult     And  . fat emulsion    . sodium chloride      Principal Problem:   Abscess, perirectal s/p I&D 08/23/2014 Active Problems:   Essential hypertension   Inflammatory bowel disease (IBD) with colitis   Sepsis   Infection due to yeast   Acute blood loss anemia   Leukocytosis   Hypokalemia   Hyponatremia   General weakness   Thrombocytopenia   Ulcerative colitis   DVT (deep venous thrombosis)   DVT (deep vein thrombosis) in pregnancy   Cold   Fasciitis   Shaniah Baltes, [facility] Pager [phone]. If 7PM-7AM, please contact night-coverage at [email or web address], password [facility] 09/09/2014, 5:07 PM  LOS: 18 days

## 2014-09-10 LAB — BASIC METABOLIC PANEL
Anion gap: 6 (ref 5–15)
BUN: 24 mg/dL — AB (ref 6–20)
CALCIUM: 8 mg/dL — AB (ref 8.9–10.3)
CO2: 29 mmol/L (ref 22–32)
CREATININE: 0.45 mg/dL (ref 0.44–1.00)
Chloride: 93 mmol/L — ABNORMAL LOW (ref 101–111)
GFR calc Af Amer: >60 mL/min (ref >60)
Glucose, Bld: 183 mg/dL — ABNORMAL HIGH (ref 65–99)
Potassium: 4.4 mmol/L (ref 3.5–5.1)
SODIUM: 128 mmol/L — AB (ref 135–145)

## 2014-09-10 LAB — CBC
HCT: 23.8 % — ABNORMAL LOW (ref 36.0–46.0)
Hemoglobin: 7.5 g/dL — ABNORMAL LOW (ref 12.0–15.0)
MCH: 30.4 pg (ref 26.0–34.0)
MCHC: 31.5 g/dL (ref 30.0–36.0)
MCV: 96.4 fL (ref 78.0–100.0)
PLATELETS: 384 10*3/uL (ref 150–400)
RBC: 2.47 MIL/uL — ABNORMAL LOW (ref 3.87–5.11)
RDW: 17.6 % — AB (ref 11.5–15.5)
WBC: 29.9 10*3/uL — ABNORMAL HIGH (ref 4.0–10.5)

## 2014-09-10 LAB — OSMOLALITY, URINE: Osmolality, Ur: 611 mOsm/kg (ref 390–1090)

## 2014-09-10 LAB — GLUCOSE, CAPILLARY
GLUCOSE-CAPILLARY: 187 mg/dL — AB (ref 65–99)
GLUCOSE-CAPILLARY: 191 mg/dL — AB (ref 65–99)
Glucose-Capillary: 180 mg/dL — ABNORMAL HIGH (ref 65–99)
Glucose-Capillary: 197 mg/dL — ABNORMAL HIGH (ref 65–99)
Glucose-Capillary: 203 mg/dL — ABNORMAL HIGH (ref 65–99)
Glucose-Capillary: 214 mg/dL — ABNORMAL HIGH (ref 65–99)

## 2014-09-10 LAB — SODIUM, URINE, RANDOM: Sodium, Ur: 20 mmol/L

## 2014-09-10 LAB — PREPARE RBC (CROSSMATCH)

## 2014-09-10 LAB — CREATININE, URINE, RANDOM: Creatinine, Urine: 57.5 mg/dL

## 2014-09-10 LAB — OSMOLALITY: Osmolality: 281 mOsm/kg (ref 275–300)

## 2014-09-10 MED ORDER — FAT EMULSION 20 % IV EMUL
240.0000 mL | INTRAVENOUS | Status: AC
  Administered 2014-09-10: 240 mL via INTRAVENOUS
  Filled 2014-09-10: qty 250

## 2014-09-10 MED ORDER — TRACE MINERALS CR-CU-MN-SE-ZN 10-1000-500-60 MCG/ML IV SOLN
INTRAVENOUS | Status: AC
  Administered 2014-09-10: 18:00:00 via INTRAVENOUS
  Filled 2014-09-10: qty 1992

## 2014-09-10 MED ORDER — SODIUM CHLORIDE 0.9 % IV SOLN: Freq: Once | INTRAVENOUS | Status: DC

## 2014-09-10 MED ORDER — INSULIN ASPART 100 UNIT/ML ~~LOC~~ SOLN
0.0000 [IU] | SUBCUTANEOUS | Status: DC
  Administered 2014-09-10 – 2014-09-11 (×4): 4 [IU] via SUBCUTANEOUS
  Administered 2014-09-11: 7 [IU] via SUBCUTANEOUS
  Administered 2014-09-11: 3 [IU] via SUBCUTANEOUS
  Administered 2014-09-11 – 2014-09-12 (×4): 4 [IU] via SUBCUTANEOUS
  Administered 2014-09-12: 3 [IU] via SUBCUTANEOUS
  Administered 2014-09-12 (×2): 4 [IU] via SUBCUTANEOUS
  Administered 2014-09-12: 3 [IU] via SUBCUTANEOUS
  Administered 2014-09-12 – 2014-09-13 (×2): 4 [IU] via SUBCUTANEOUS
  Administered 2014-09-13 – 2014-09-14 (×4): 3 [IU] via SUBCUTANEOUS
  Administered 2014-09-14 – 2014-09-15 (×5): 4 [IU] via SUBCUTANEOUS
  Administered 2014-09-15 – 2014-09-17 (×8): 3 [IU] via SUBCUTANEOUS

## 2014-09-10 NOTE — Progress Notes (Signed)
4 Days Post-Op  Subjective: RN reports that she is having some bloody stools/fistula drainage  Objective: Vital signs in last 24 hours: Temp:  [97.5 F (36.4 C)-98.2 F (36.8 C)] 98.2 F (36.8 C) (07/24 9476) Pulse Rate:  [65-72] 72 (07/24 0638) Resp:  [16-18] 18 (07/24 5465) BP: (116-126)/(54-81) 116/58 mmHg (07/24 0638) SpO2:  [96 %-97 %] 96 % (07/24 0638) Last BM Date: 09/09/14  Intake/Output from previous day: 07/23 0701 - 07/24 0700 In: -  Out: 2250 [Urine:2250] Intake/Output this shift:    PE: General- In NAD Anorectal wound not examined today  Lab Results:   Recent Labs  09/08/14 0433 09/09/14 0500  WBC 15.4* 20.6*  HGB 7.9* 7.7*  HCT 24.7* 24.0*  PLT 363 378   BMET  Recent Labs  09/08/14 0433 09/09/14 0500  NA 131* 131*  K 4.0 4.2  CL 95* 93*  CO2 31 31  GLUCOSE 176* 176*  BUN 13 18  CREATININE 0.43* 0.45  CALCIUM 7.7* 8.0*   PT/INR No results for input(s): LABPROT, INR in the last 72 hours. Comprehensive Metabolic Panel:    Component Value Date/Time   NA 131* 09/09/2014 0500   NA 131* 09/08/2014 0433   K 4.2 09/09/2014 0500   K 4.0 09/08/2014 0433   CL 93* 09/09/2014 0500   CL 95* 09/08/2014 0433   CO2 31 09/09/2014 0500   CO2 31 09/08/2014 0433   BUN 18 09/09/2014 0500   BUN 13 09/08/2014 0433   CREATININE 0.45 09/09/2014 0500   CREATININE 0.43* 09/08/2014 0433   CREATININE 1.08 03/28/2014 1022   GLUCOSE 176* 09/09/2014 0500   GLUCOSE 176* 09/08/2014 0433   CALCIUM 8.0* 09/09/2014 0500   CALCIUM 7.7* 09/08/2014 0433   AST 29 09/07/2014 0438   AST 15 08/23/2014 0345   ALT 19 09/07/2014 0438   ALT 13* 08/23/2014 0345   ALKPHOS 108 09/07/2014 0438   ALKPHOS 46 08/23/2014 0345   BILITOT 0.3 09/07/2014 0438   BILITOT 0.7 08/23/2014 0345   PROT 5.1* 09/07/2014 0438   PROT 4.9* 08/23/2014 0345   ALBUMIN 1.8* 09/07/2014 0438   ALBUMIN 2.0* 08/23/2014 0345     Studies/Results: No results  found.  Anti-infectives: Anti-infectives    Start     Dose/Rate Route Frequency Ordered Stop   09/07/14 1200  fluconazole (DIFLUCAN) IVPB 400 mg     400 mg 100 mL/hr over 120 Minutes Intravenous Every 24 hours 09/06/14 2117     09/06/14 2200  metroNIDAZOLE (FLAGYL) IVPB 250 mg     250 mg 50 mL/hr over 60 Minutes Intravenous Every 8 hours 09/06/14 2117     09/05/14 1500  metroNIDAZOLE (FLAGYL) tablet 250 mg  Status:  Discontinued     250 mg Oral 3 times per day 09/05/14 1440 09/06/14 2117   08/28/14 0000  fluconazole (DIFLUCAN) 200 MG tablet     400 mg Oral Daily 08/28/14 1052     08/28/14 0000  amoxicillin-clavulanate (AUGMENTIN) 875-125 MG per tablet     1 tablet Oral 2 times daily 08/28/14 1052     08/25/14 2000  vancomycin (VANCOCIN) IVPB 1000 mg/200 mL premix  Status:  Discontinued     1,000 mg 200 mL/hr over 60 Minutes Intravenous Every 12 hours 08/25/14 1009 08/26/14 0949   08/25/14 1200  fluconazole (DIFLUCAN) tablet 400 mg  Status:  Discontinued     400 mg Oral Daily 08/25/14 0943 09/06/14 2117   08/23/14 1000  vancomycin (VANCOCIN) IVPB 750 mg/150 ml  premix  Status:  Discontinued     750 mg 150 mL/hr over 60 Minutes Intravenous Every 12 hours 08/22/14 2127 08/25/14 1009   08/23/14 0600  piperacillin-tazobactam (ZOSYN) IVPB 3.375 g  Status:  Discontinued     3.375 g 12.5 mL/hr over 240 Minutes Intravenous 3 times per day 08/22/14 2124 09/05/14 1356   08/22/14 2130  piperacillin-tazobactam (ZOSYN) IVPB 3.375 g     3.375 g 100 mL/hr over 30 Minutes Intravenous NOW 08/22/14 2124 08/22/14 2220   08/22/14 2130  [MAR Hold]  vancomycin (VANCOCIN) 2,000 mg in sodium chloride 0.9 % 500 mL IVPB     (MAR Hold since 08/22/14 2338)   2,000 mg 250 mL/hr over 120 Minutes Intravenous NOW 08/22/14 2127 08/23/14 0008      Assessment Large perirectal abscess and now fistula Severe colitis-UC vs Crohns; on immunosuppressives Bloody per rectum/fistula-likely related to the severe  colitis   LOS: 19 days   Plan: Stat CBC, may need transfusion depending on results. Continue wound care.  If medical treatment does not help with colitis, may need abdominal colectomy and ileostomy.   Wilfredo Canterbury Lenna Sciara 09/10/2014

## 2014-09-10 NOTE — Progress Notes (Signed)
   Subjective  Just had a large bloody stool   Objective  Denies abd. Pain, continues to bleed, severe  Colitis on flexible sigmoidoscopy earlier this week, Hgb down again to 7.5. She is ready to have a colectomy/ileostomy Vital signs in last 24 hours: Temp:  [97.5 F (36.4 C)-98.2 F (36.8 C)] 98.2 F (36.8 C) (07/24 3532) Pulse Rate:  [65-72] 72 (07/24 0638) Resp:  [16-18] 18 (07/24 9924) BP: (116-126)/(54-81) 116/58 mmHg (07/24 0638) SpO2:  [96 %-97 %] 96 % (07/24 2683) Weight:  [236 lb 8.9 oz (107.3 kg)] 236 lb 8.9 oz (107.3 kg) (07/24 4196) Last BM Date: 09/10/14 General:    white female in NAD Heart:  Regular rate and rhythm; no murmurs Lungs: Respirations even and unlabored, lungs CTA bilaterally Abdomen:  Soft, nontender and nondistended. Normal bowel sounds., large puddle of blood around the perineum Extremities:  Without edema. Neurologic:  Alert and oriented,  grossly normal neurologically. Psych:  Cooperative. Normal mood and affect.  Intake/Output from previous day: 07/23 0701 - 07/24 0700 In: -  Out: 2250 [Urine:2250] Intake/Output this shift:    Lab Results:  Recent Labs  09/08/14 0433 09/09/14 0500 09/10/14 0730  WBC 15.4* 20.6* 29.9*  HGB 7.9* 7.7* 7.5*  HCT 24.7* 24.0* 23.8*  PLT 363 378 384   BMET  Recent Labs  09/08/14 0433 09/09/14 0500 09/10/14 0822  NA 131* 131* 128*  K 4.0 4.2 4.4  CL 95* 93* 93*  CO2 31 31 29   GLUCOSE 176* 176* 183*  BUN 13 18 24*  CREATININE 0.43* 0.45 0.45  CALCIUM 7.7* 8.0* 8.0*   LFT No results for input(s): PROT, ALBUMIN, AST, ALT, ALKPHOS, BILITOT, BILIDIR, IBILI in the last 72 hours. PT/INR No results for input(s): LABPROT, INR in the last 72 hours.  Studies/Results: No results found.     Assessment / Plan:   Continues LGIB from Crohn's colitis Rectal abscess cannot heal without diverting the stool stream Steroid/Remicade cTNA I am leaning toward total colectomy and ileostomy   Principal  Problem:   Abscess, perirectal s/p I&D 08/23/2014 Active Problems:   Essential hypertension   Inflammatory bowel disease (IBD) with colitis   Sepsis   Infection due to yeast   Acute blood loss anemia   Leukocytosis   Hypokalemia   Hyponatremia   General weakness   Thrombocytopenia   Ulcerative colitis   DVT (deep venous thrombosis)   DVT (deep vein thrombosis) in pregnancy   Cold   Fasciitis     LOS: 19 days   Delfin Edis  09/10/2014, 1:49 PM

## 2014-09-10 NOTE — Progress Notes (Addendum)
[facility] PROGRESS NOTE  [name] VWP:[id number] DOB: [birth year] DOA: 08/22/2014 PCP: Kandice Hams, MD  Brief narrative: 74 y.o. female with past medical history of dyslipidemia, hypertension, long standing ulcerative colitis and recently hospitalized from 08/07/2014 through 08/21/2014 for ulcerative colitis flare. She had flexible sigmoidoscopy 08/08/2014 which demonstrated inflammatory changes from the rectum consistent with ulcerative colitis. She presented to [facility] long hospital with perianal pain started the night prior to the admission. CT scan on the admission demonstrated a complex perirectal infection extending up toward the retroperitoneal area.  08/21/13 underwent incision and drainage of the large issue rectal abscess on the right side. She was started on vancomycin and Zosyn. Postprocedure she was doing fine but has developed large amount of bleed on the morning of 08/23/2014. GI was consulted and pt was started on prednisone and mesalamine.  Hospital course is complicated with ongoing rectal bleed so Additionally, she was found to have left LE DVT On venous doppler done 7/12.   On 7/20, pt underwent flex sig with findings of significant L sided disease with GI recs for continued bowel rest, PO flagyl, increased steroids, and Remicade. The patient has since noted feeling improved.  Assessment/Plan: Sepsis secondary to peri-rectal abscess / leukocytosis  - Sepsis on admission with tachycardia, tachypnea, hypotension, leukocytosis. Source of infection is perirectal abscess. - S/P incision and drainage by surgery 08/23/2014 - Started on vancomycin and Zosyn at the time of the admission. Abscess culture demonstrated rare yeast and multiple other organisms/ none predominant - Blood cultures negative.  - Vancomycin was stopped 08/26/2014. Currently on Zosyn and fluconazole. - Dr. Megan Salon of infectious disease recommended total duration of antibiotics - 14 days with stop  date on 7/18- but due to stool in abscess cavity and possible fistula, abx duration to be deferred to Surgery. Surgery was originally planned for 7/21, however given recent flex sig findings of marked inflammatory disease  Bloody diarrhea due to UC  - Patient had a rectal bleed after incision and drainage done 08/23/2014 which has continued -GI consulted and pt started on prednisone and mesalamine which was not effective in controlling diarrhea/ bleeding and therefore started on Remicaid 7/15 - Pt is now s/p flex sig with findings of significant disease. Discussed with GI with plans for continued bowel rest with immunomodulators and increased steroids - Pt reports feeling well currently - Consideration for possible total colectomy and ileostomy if no significant improvement with medical management  Acute blood loss anemia  - Hb 12 on admission and 7.2 on 7/18- transfused 1 U PRBC - Pt continues with gradually decreasing hgb and brbpr. Hgb down to 7.5 as of 7/24. Transfuse an additional one unit PRBC   Essential hypertension - SBP was elevated in 150- 160s-stopped Hydralzine - Pt continued on Norvasc and toprol 50mg  - BP has improved  B/L lower extremity DVT - Not a candidate for anticoagulation due to ongoing uncontrolled ulcerative colitis and rectal bleed - therefore, an IVC filter was placed   Hypokalemia/ hyponatremia - Likely from GI losses - sodium low today- continued on IVF   Generalized weakness  - Plan to ultimately d/c to skilled nursing facility once medically stable.  Hyponatremia - Question if related to tube feeds - urine and serum osm pending - urine cr of 20  Code Status: Full Family Communication: Pt in room Disposition Plan: Pending   Consultants:  GI  General Surgery  Procedures:  IVC filter   PICC line  venous duplex  Flex sig 7/20  Antibiotics: Fluconazole 7/8>>> Flagyl 7/19>>>  HPI/Subjective: Pt reports passing BRBPR 3hrs  before being seen. No abd pain  Objective: Filed Vitals:   09/10/14 0638 09/10/14 1415 09/10/14 1615 09/10/14 1639  BP: 116/58 107/60 114/56 115/57  Pulse: 72 76 78 77  Temp: 98.2 F (36.8 C) 98.4 F (36.9 C) 98.2 F (36.8 C) 98 F (36.7 C)  TempSrc: Oral Oral Oral Oral  Resp: 18 18 18 18   Height:      Weight: 107.3 kg (236 lb 8.9 oz)     SpO2: 96% 100% 98% 96%    Intake/Output Summary (Last 24 hours) at 09/10/14 1709 Last data filed at 09/10/14 1615  Gross per 24 hour  Intake    250 ml  Output    900 ml  Net   -650 ml   Filed Weights   08/23/14 0153 08/23/14 0434 09/10/14 8756  Weight: 102.3 kg (225 lb 8.5 oz) 104 kg (229 lb 4.5 oz) 107.3 kg (236 lb 8.9 oz)    Exam:   General:  Asleep, easily arousable, laying in bed, in nad  Cardiovascular: regular, s1, s2  Respiratory: normal resp effort, no wheezing  Abdomen: soft, pos BS, obese  Musculoskeletal: perfused, no clubbing, no cyanosis  Data Reviewed: Basic Metabolic Panel:  Recent Labs Lab 09/06/14 0410 09/07/14 0438 09/08/14 0433 09/09/14 0500 09/10/14 0822  NA 127* 133* 131* 131* 128*  K 4.4 4.3 4.0 4.2 4.4  CL 96* 97* 95* 93* 93*  CO2 27 31 31 31 29   GLUCOSE 140* 155* 176* 176* 183*  BUN 11 10 13 18  24*  CREATININE 0.40* 0.46 0.43* 0.45 0.45  CALCIUM 7.7* 7.7* 7.7* 8.0* 8.0*  MG 1.4* 2.5*  --   --   --   PHOS 2.6 3.2  --   --   --    Liver Function Tests:  Recent Labs Lab 09/07/14 0438  AST 29  ALT 19  ALKPHOS 108  BILITOT 0.3  PROT 5.1*  ALBUMIN 1.8*   No results for input(s): LIPASE, AMYLASE in the last 168 hours. No results for input(s): AMMONIA in the last 168 hours. CBC:  Recent Labs Lab 09/06/14 0410 09/07/14 0438 09/08/14 0433 09/09/14 0500 09/10/14 0730  WBC 14.9* 11.8* 15.4* 20.6* 29.9*  NEUTROABS  --  10.6*  --   --   --   HGB 8.8* 8.2* 7.9* 7.7* 7.5*  HCT 27.2* 26.1* 24.7* 24.0* 23.8*  MCV 93.8 94.9 96.5 96.0 96.4  PLT 420* 362 363 378 384   Cardiac  Enzymes: No results for input(s): CKTOTAL, CKMB, CKMBINDEX, TROPONINI in the last 168 hours. BNP (last 3 results) No results for input(s): BNP in the last 8760 hours.  ProBNP (last 3 results) No results for input(s): PROBNP in the last 8760 hours.  CBG:  Recent Labs Lab 09/10/14 0014 09/10/14 0424 09/10/14 0829 09/10/14 1212 09/10/14 1655  GLUCAP 214* 203* 180* 197* 187*    No results found for this or any previous visit (from the past 240 hour(s)).   Studies: No results found.  Scheduled Meds: . sodium chloride   Intravenous Once  . amLODipine  10 mg Oral Daily  . fluconazole (DIFLUCAN) IV  400 mg Intravenous Q24H  . insulin aspart  0-20 Units Subcutaneous Q4H  . lip balm  1 application Topical BID  . mesalamine  4.8 g Oral Q breakfast  . methylPREDNISolone (SOLU-MEDROL) injection  20 mg Intravenous 3 times per day  . metoprolol  2.5 mg Intravenous  4 times per day  . metronidazole  250 mg Intravenous Q8H  . nystatin   Topical BID  . saccharomyces boulardii  250 mg Oral BID  . sodium chloride  10-40 mL Intracatheter Q12H  . sodium chloride  3 mL Intravenous Q12H  . sodium chloride  3 mL Intravenous Q12H   Continuous Infusions: . Marland KitchenTPN (CLINIMIX-E) Adult 83 mL/hr at 09/09/14 1750   And  . fat emulsion 240 mL (09/09/14 1750)  . Marland KitchenTPN (CLINIMIX-E) Adult     And  . fat emulsion    . sodium chloride      Principal Problem:   Abscess, perirectal s/p I&D 08/23/2014 Active Problems:   Essential hypertension   Inflammatory bowel disease (IBD) with colitis   Sepsis   Infection due to yeast   Acute blood loss anemia   Leukocytosis   Hypokalemia   Hyponatremia   General weakness   Thrombocytopenia   Ulcerative colitis   DVT (deep venous thrombosis)   DVT (deep vein thrombosis) in pregnancy   Cold   Fasciitis   [facility], [place] [facility] Pager [phone]. If 7PM-7AM, please contact night-coverage at [email or web address], password [facility] 09/10/2014, 5:09 PM  LOS:  19 days

## 2014-09-10 NOTE — Progress Notes (Signed)
[place] NOTE  Pharmacy Consult for TPN Indication: severe IBD with fistula and abscess  Allergies  Allergen Reactions  . Clindamycin/Lincomycin Diarrhea    Leads to colitis flare  . Ivp Dye [Iodinated Diagnostic Agents] Other (See Comments)    "almost passed out"    Patient Measurements: Height: 5\' 9"  (175.3 cm) Weight: 229 lb 4.5 oz (104 kg) IBW/kg (Calculated) : 66.2 Adjusted Body Weight: 81.3kg  Vital Signs: Temp: 98.2 F (36.8 C) (07/24 0638) Temp Source: Oral (07/24 1950) BP: 116/58 mmHg (07/24 9326) Pulse Rate: 72 (07/24 7124) Intake/Output from previous day: 07/23 0701 - 07/24 0700 In: -  Out: 2250 [Urine:2250] Intake/Output from this shift:    Labs:  Recent Labs  09/08/14 0433 09/09/14 0500  WBC 15.4* 20.6*  HGB 7.9* 7.7*  HCT 24.7* 24.0*  PLT 363 378     Recent Labs  09/08/14 0433 09/09/14 0500  NA 131* 131*  K 4.0 4.2  CL 95* 93*  CO2 31 31  GLUCOSE 176* 176*  BUN 13 18  CREATININE 0.43* 0.45  CALCIUM 7.7* 8.0*   Estimated Creatinine Clearance: 76.8 mL/min (by C-G formula based on Cr of 0.45).    Recent Labs  09/09/14 2014 09/10/14 0014 09/10/14 0424  GLUCAP 194* 214* 203*     Medications: Continuous Infusions: . Marland KitchenTPN (CLINIMIX-E) Adult 83 mL/hr at 09/09/14 1750   And  . fat emulsion 240 mL (09/09/14 1750)  . sodium chloride     Insulin Requirements:  Moderate SSI: 19 units / 24 hrs Insulin in TPN: 20 units  Current Nutrition:  Diet: NPO TPN @ 83 ml/hr, Lipids @ 10 ml/hr IVF: NS @ KVO   Central access: PICC placed 08/30/14 TPN start date:  7/20  ASSESSMENT                                                                                                          HPI:  74 y.o. female with past medical history of dyslipidemia, hypertension, long standing ulcerative colitis and recently hospitalized from 08/07/2014 through 08/21/2014 for ulcerative colitis flare.  She presented to [facility] 7/5  for readmission d/t perianal pain.  S/p I&D on 08/21/13 of the large issue rectal abscess on the right side.  Further surgery (colectomy vs colostomy) postponed due to severe colitis.  Pharmacy consulted to start TPN for severe IBD with fistula and abscess.  Significant events:  7/5 I&D of complex perirectal abscess 7/20 flex sig: severe left sided colitis consistent with Crohn's colitis; flexi-seal removed.  TPN started. 7/22: TPN titrated to goal rate  Today:   Glucose (goal CBG's <150):  no history of diabetes, remains on Solumedrol TID; CBGs elevated (range162-214) and increased despite insulin added to TPN on 7/22, increased 7/23, and covered with SSI.  Electrolytes:  K, CorrCa 9.76 are WNL.  Na, Cl remain low.  Renal:  SCr 0.45  LFTs:  WNL (7/21)  TGs: 128 (7/21)  Prealbumin: 19.6 (7/21)  NUTRITIONAL GOALS  RD recs (7/22): 1900-2100 Kcal/day, 100-110 g protein / day Clinimix E 5/15 at a goal rate of 83 ml/hr + 20% fat emulsion at 38ml/hr to provide: 100 g/day protein, 1894 Kcal/day.   PLAN                                                                                                                         At 1800 today:  Continue Clinimix E 5/15 at 83 ml/hr.  Insulin regular, increase to 25 units / 24 hours  TPN to contain standard multivitamins and trace elements.  Continue 20% fat emulsion at 10 ml/hr.  Continue NS at [facility].  Continue CBGs and increase to resistant SSI q4h  TPN lab panels on Mondays & Thursdays.  F/u daily.   Gretta Arab PharmD, BCPS Pager [phone] 09/10/2014 7:45 AM

## 2014-09-11 DIAGNOSIS — K6389 Other specified diseases of intestine: Secondary | ICD-10-CM

## 2014-09-11 LAB — DIFFERENTIAL
BASOS ABS: 0 10*3/uL (ref 0.0–0.1)
Basophils Relative: 0 % (ref 0–1)
Eosinophils Absolute: 0 10*3/uL (ref 0.0–0.7)
Eosinophils Relative: 0 % (ref 0–5)
LYMPHS ABS: 2.3 10*3/uL (ref 0.7–4.0)
Lymphocytes Relative: 9 % — ABNORMAL LOW (ref 12–46)
MONOS PCT: 5 % (ref 3–12)
Monocytes Absolute: 1.3 10*3/uL — ABNORMAL HIGH (ref 0.1–1.0)
NEUTROS ABS: 22 10*3/uL — AB (ref 1.7–7.7)
Neutrophils Relative %: 86 % — ABNORMAL HIGH (ref 43–77)

## 2014-09-11 LAB — CBC
HCT: 22.3 % — ABNORMAL LOW (ref 36.0–46.0)
Hemoglobin: 7.4 g/dL — ABNORMAL LOW (ref 12.0–15.0)
MCH: 30.5 pg (ref 26.0–34.0)
MCHC: 33.2 g/dL (ref 30.0–36.0)
MCV: 91.8 fL (ref 78.0–100.0)
PLATELETS: 293 10*3/uL (ref 150–400)
RBC: 2.43 MIL/uL — ABNORMAL LOW (ref 3.87–5.11)
RDW: 18.7 % — ABNORMAL HIGH (ref 11.5–15.5)
WBC: 25.6 10*3/uL — AB (ref 4.0–10.5)

## 2014-09-11 LAB — COMPREHENSIVE METABOLIC PANEL
ALBUMIN: 1.9 g/dL — AB (ref 3.5–5.0)
ALT: 27 U/L (ref 14–54)
AST: 28 U/L (ref 15–41)
Alkaline Phosphatase: 82 U/L (ref 38–126)
Anion gap: 3 — ABNORMAL LOW (ref 5–15)
BILIRUBIN TOTAL: 0.5 mg/dL (ref 0.3–1.2)
BUN: 30 mg/dL — AB (ref 6–20)
CO2: 29 mmol/L (ref 22–32)
Calcium: 7.6 mg/dL — ABNORMAL LOW (ref 8.9–10.3)
Chloride: 96 mmol/L — ABNORMAL LOW (ref 101–111)
Creatinine, Ser: 0.43 mg/dL — ABNORMAL LOW (ref 0.44–1.00)
GFR calc Af Amer: >60 mL/min (ref >60)
GFR calc non Af Amer: >60 mL/min (ref >60)
Glucose, Bld: 182 mg/dL — ABNORMAL HIGH (ref 65–99)
Potassium: 4.5 mmol/L (ref 3.5–5.1)
SODIUM: 128 mmol/L — AB (ref 135–145)
TOTAL PROTEIN: 4.8 g/dL — AB (ref 6.5–8.1)

## 2014-09-11 LAB — GLUCOSE, CAPILLARY
GLUCOSE-CAPILLARY: 166 mg/dL — AB (ref 65–99)
Glucose-Capillary: 148 mg/dL — ABNORMAL HIGH (ref 65–99)
Glucose-Capillary: 168 mg/dL — ABNORMAL HIGH (ref 65–99)
Glucose-Capillary: 179 mg/dL — ABNORMAL HIGH (ref 65–99)
Glucose-Capillary: 197 mg/dL — ABNORMAL HIGH (ref 65–99)
Glucose-Capillary: 208 mg/dL — ABNORMAL HIGH (ref 65–99)

## 2014-09-11 LAB — HEMOGLOBIN AND HEMATOCRIT, BLOOD
HEMATOCRIT: 24.1 % — AB (ref 36.0–46.0)
Hemoglobin: 7.7 g/dL — ABNORMAL LOW (ref 12.0–15.0)

## 2014-09-11 LAB — PREALBUMIN: Prealbumin: 26.9 mg/dL (ref 18–38)

## 2014-09-11 LAB — MAGNESIUM: MAGNESIUM: 1.7 mg/dL (ref 1.7–2.4)

## 2014-09-11 LAB — PHOSPHORUS: PHOSPHORUS: 3.4 mg/dL (ref 2.5–4.6)

## 2014-09-11 LAB — TRIGLYCERIDES: Triglycerides: 109 mg/dL (ref <150)

## 2014-09-11 MED ORDER — PIPERACILLIN-TAZOBACTAM 3.375 G IVPB
3.3750 g | Freq: Three times a day (TID) | INTRAVENOUS | Status: DC
  Administered 2014-09-11 – 2014-09-19 (×24): 3.375 g via INTRAVENOUS
  Filled 2014-09-11 (×26): qty 50

## 2014-09-11 MED ORDER — LORAZEPAM 2 MG/ML IJ SOLN
1.0000 mg | Freq: Once | INTRAMUSCULAR | Status: AC
  Administered 2014-09-11: 1 mg via INTRAVENOUS
  Filled 2014-09-11: qty 1

## 2014-09-11 MED ORDER — METHYLPREDNISOLONE SODIUM SUCC 40 MG IJ SOLR
20.0000 mg | Freq: Two times a day (BID) | INTRAMUSCULAR | Status: DC
  Administered 2014-09-11 – 2014-09-12 (×2): 20 mg via INTRAVENOUS
  Filled 2014-09-11 (×4): qty 0.5

## 2014-09-11 MED ORDER — FAT EMULSION 20 % IV EMUL
240.0000 mL | INTRAVENOUS | Status: AC
  Administered 2014-09-11: 240 mL via INTRAVENOUS
  Filled 2014-09-11: qty 250

## 2014-09-11 MED ORDER — TRACE MINERALS CR-CU-MN-SE-ZN 10-1000-500-60 MCG/ML IV SOLN
INTRAVENOUS | Status: AC
  Administered 2014-09-11: 17:00:00 via INTRAVENOUS
  Filled 2014-09-11: qty 1992

## 2014-09-11 NOTE — Care Management Important Message (Signed)
Important Message  Patient Details  Name: FELESHIA ZUNDEL MRN: [id number] Date of Birth: [birth year]   Medicare Important Message Given:  Yes-fourth notification given    Camillo Flaming 09/11/2014, 1:30 [place] Message  Patient Details  Name: ROSANGELA FEHRENBACH MRN: [id number] Date of Birth: 04/19/1940   Medicare Important Message Given:  Yes-fourth notification given    Camillo Flaming 09/11/2014, 1:30 PM

## 2014-09-11 NOTE — Progress Notes (Signed)
Chaplain attempted to visit patient during rounds; however nurse was in the process of attending to patient's needs. Chaplain will come back to visit.   09/11/14 1200  Clinical Encounter Type  Visited With Patient  Visit Type Initial;Spiritual support;Social support  Referral From Chaplain;Nurse

## 2014-09-11 NOTE — Progress Notes (Addendum)
ANTIBIOTIC CONSULT NOTE - INITIAL  Pharmacy Consult for Zosyn Indication: Peri-rectal abscess  Allergies  Allergen Reactions  . Clindamycin/Lincomycin Diarrhea    Leads to colitis flare  . Ivp Dye [Iodinated Diagnostic Agents] Other (See Comments)    "almost passed out"    Patient Measurements: Height: 5\' 9"  (175.3 cm) Weight: 236 lb 8.9 oz (107.3 kg) IBW/kg (Calculated) : 66.2  Vital Signs: Temp: 98.2 F (36.8 C) (07/25 1424) Temp Source: Oral (07/25 1424) BP: 120/76 mmHg (07/25 1424) Pulse Rate: 78 (07/25 1424) Intake/Output from previous day: 07/24 0701 - 07/25 0700 In: 538 [I.V.:250; Blood:288] Out: 1350 [UXNAT:5573] Intake/Output from this shift:    Labs:  Recent Labs  09/09/14 0500 09/10/14 0730 09/10/14 0822 09/10/14 0945 09/11/14 0333 09/11/14 0605  WBC 20.6* 29.9*  --   --  25.6*  --   HGB 7.7* 7.5*  --   --  7.4* 7.7*  PLT 378 384  --   --  293  --   LABCREA  --   --   --  57.50  --   --   CREATININE 0.45  --  0.45  --  0.43*  --    Estimated Creatinine Clearance: 78 mL/min (by C-G formula based on Cr of 0.43). No results for input(s): VANCOTROUGH, VANCOPEAK, VANCORANDOM, GENTTROUGH, GENTPEAK, GENTRANDOM, TOBRATROUGH, TOBRAPEAK, TOBRARND, AMIKACINPEAK, AMIKACINTROU, AMIKACIN in the last 72 hours.   Microbiology: Recent Results (from the past 720 hour(s))  Culture, blood (routine x 2)     Status: None   Collection Time: 08/22/14  9:30 PM  Result Value Ref Range Status   Specimen Description BLOOD LEFT HAND  Final   Special Requests BOTTLES DRAWN AEROBIC ONLY 5CC  Final   Culture   Final    NO GROWTH 5 DAYS Performed at [facility]    Report Status 08/27/2014 FINAL  Final  Anaerobic culture     Status: None   Collection Time: 08/23/14 12:03 AM  Result Value Ref Range Status   Specimen Description ABSCESS PERIRECTAL  Final   Special Requests PATIENT ON FOLLOWING ZOYSN, VANC  Final   Gram Stain   Final    NO WBC SEEN NO SQUAMOUS  EPITHELIAL CELLS SEEN MODERATE GRAM NEGATIVE RODS FEW YEAST RARE GRAM POSITIVE COCCI    Culture   Final    NO ANAEROBES ISOLATED Performed at [facility]    Report Status 08/27/2014 FINAL  Final  Culture, routine-abscess     Status: None   Collection Time: 08/23/14 12:03 AM  Result Value Ref Range Status   Specimen Description ABSCESS PERIRECTAL  Final   Special Requests PATIENT ON FOLLOWING ZOYSN, VANC  Final   Gram Stain   Final    NO WBC SEEN NO SQUAMOUS EPITHELIAL CELLS SEEN MODERATE GRAM NEGATIVE RODS RARE YEAST RARE GRAM POSITIVE COCCI    Culture   Final    MULTIPLE ORGANISMS PRESENT, NONE PREDOMINANT Note: NO STAPHYLOCOCCUS AUREUS ISOLATED NO GROUP A STREP (S.PYOGENES) ISOLATED Performed at [facility]    Report Status 08/26/2014 FINAL  Final  MRSA PCR Screening     Status: None   Collection Time: 08/23/14  1:32 AM  Result Value Ref Range Status   MRSA by PCR NEGATIVE NEGATIVE Final    Comment:        The GeneXpert MRSA Assay (FDA approved for NASAL specimens only), is one component of a comprehensive MRSA colonization surveillance program. It is not intended to diagnose MRSA infection nor  to guide or monitor treatment for MRSA infections.   Culture, blood (routine x 2)     Status: None   Collection Time: 08/23/14  4:55 AM  Result Value Ref Range Status   Specimen Description BLOOD LEFT HAND  Final   Special Requests BOTTLES DRAWN AEROBIC ONLY 5ML  Final   Culture   Final    NO GROWTH 5 DAYS Performed at [facility]    Report Status 08/28/2014 FINAL  Final  Clostridium Difficile by PCR (not at [facility])     Status: None   Collection Time: 08/28/14  7:11 PM  Result Value Ref Range Status   C difficile by pcr NEGATIVE NEGATIVE Final    Medical History: Past Medical History  Diagnosis Date  . CAD (coronary artery disease)     unspecified  . Cerebrovascular disease   . Hyperlipidemia, mixed   . HTN (hypertension)   .  Ulcerative colitis   . Diverticulitis   . Colon polyps 2006  . Thyroid disease   . Myocardial infarct   . Carotid artery occlusion 2010  . Diabetes mellitus without complication     Borderline  . GERD (gastroesophageal reflux disease)   . Arthritis     Assessment: 37 y/oF with PMH of dyslipidemia, HTN, long standing ulcerative colitis and recently hospitalized from 6/20-7/4 for ulcerative colitis flare. She presented to [facility] 7/5 for readmission d/t perianal pain. S/p I&D on 7/5 of peri-rectal abscess on the right side. Hospital course complicated by ongoing rectal bleed and LE DVT. Patient underwent flex sig on 7/20 with findings of severe colitis. Consideration for possible total colectomy and ileostomy if no significant improvement with medical management.  Current antibiotics include Diflucan and Flagyl.  Pharmacy now consulted to dose Zosyn, as WBC significantly elevated and patient more encephalopathic per MD.  7/5 >> Vancomycin>> 7/9 7/5 >> Zosyn >> 7/19 7/8 >> Diflucan >> 7/19 >> Flagyl >> 7/25 >> Zosyn >>  7/5 blood: NGF 7/6 blood: NGF 7/6 abscess: multiple organisms present, none predominant. Rare yeast. No Staph aureus or group A strep isolated. No anaerobes isolated. 7/6 MRSA PCR: negative 7/11 C.diff PCR: negative  WBC: 25.6 K SCr 0.43 with CrCl ~ 78 ml/min CG Afebrile  Goal of Therapy:  Appropriate antibiotic dosing for renal function and indication Eradication of infection  Plan:  Zosyn 3.375g IV q8h (infuse over 4 hours) Monitor renal function, cultures, clinical course.   Lindell Spar, PharmD, BCPS Pager: [phone] 09/11/2014 3:53 PM

## 2014-09-11 NOTE — Progress Notes (Signed)
PT Cancellation Note  Patient Details Name: [name] MRN: [id number] DOB: 09-28-1940   Cancelled Treatment:    Reason Eval/Treat Not Completed: Medical issues which prohibited therapy (pt not able to participate in PT per RN, will sign off. Please re-order when appropriate. )   Philomena Doheny 09/11/2014, 11:34 AM [phone]

## 2014-09-11 NOTE — Progress Notes (Signed)
     [facility] Progress Note  Subjective:   Hgb 7.4. WBC 25.6.Pt denies pain, but says she had some blood with stools earlier. Flex sig 7/20 with severe colitis.   Objective:  Vital signs in last 24 hours: Temp:  [98 F (36.7 C)-98.7 F (37.1 C)] 98.3 F (36.8 C) (07/25 0532) Pulse Rate:  [73-91] 76 (07/25 0532) Resp:  [18-19] 18 (07/25 0532) BP: (107-128)/(56-76) 125/62 mmHg (07/25 0532) SpO2:  [93 %-100 %] 97 % (07/25 0532) Last BM Date: 09/10/14 General:   Alert,  Well-developed,  in NAD Heart:  Regular rate and rhythm; no murmurs Pulm;lungs clear Abdomen:  Soft, mild tenderness,  nondistended. Normal bowel sounds  Extremities:  Without edema. Neurologic:  Alert and  oriented x4;  grossly normal neurologically. Psych:  Alert and cooperative. Normal mood and affect.  Intake/Output from previous day: 07/24 0701 - 07/25 0700 In: 538 [I.V.:250; Blood:288] Out: 1103 [Urine:1350] Intake/Output this shift:    Lab Results:  Recent Labs  09/09/14 0500 09/10/14 0730 09/11/14 0333 09/11/14 0605  WBC 20.6* 29.9* 25.6*  --   HGB 7.7* 7.5* 7.4* 7.7*  HCT 24.0* 23.8* 22.3* 24.1*  PLT 378 384 293  --    BMET  Recent Labs  09/09/14 0500 09/10/14 0822 09/11/14 0333  NA 131* 128* 128*  K 4.2 4.4 4.5  CL 93* 93* 96*  CO2 31 29 29   GLUCOSE 176* 183* 182*  BUN 18 24* 30*  CREATININE 0.45 0.45 0.43*  CALCIUM 8.0* 8.0* 7.6*   LFT  Recent Labs  09/11/14 0333  PROT 4.8*  ALBUMIN 1.9*  AST 28  ALT 27  ALKPHOS 82  BILITOT 0.5     ASSESSMENT/PLAN:   74 yo female with LGIB secondary to Crohn's. Rectal abscess not healing-needs stool stream diverted. On Lialda, Solumedrol, Flagyl. Continue local wound care. If no further improvement will likely need colectomy with ileostomy.     LOS: 20 days   Hvozdovic, Deloris Ping 09/11/2014, Pager [phone]    Attending physician's note   I have taken an interval history, reviewed the chart and examined  the patient. I agree with the Advanced Practitioner's note, impression and recommendations. IBD with severe colitis, rectal abscess and fistula. Remicade given on 7/15. Will decrease and then attempt to taper Solumedrol dose to help with infection. If she does not improve or only improves slowly she will need total colectomy with ileostomy.   Pricilla Riffle. Fuller Plan, MD [facility]

## 2014-09-11 NOTE — Progress Notes (Signed)
Date:  September 11, 2014 U.R. performed for needs and level of care. hgb down to 7.7 after one unit of PRBC to receive another unit today.  Surgical intervene pending. Patient continues on iv steroids,iv flds and bowel rest. Will continue to follow for Case Management needs.  Velva Harman, RN, BSN, [place]   [phone]

## 2014-09-11 NOTE — Progress Notes (Signed)
[facility] PROGRESS NOTE  [name] ZDG:[id number] DOB: 05/13/1940 DOA: 08/22/2014 PCP: Kandice Hams, MD  Brief narrative: 74 y.o. female with past medical history of dyslipidemia, hypertension, long standing ulcerative colitis and recently hospitalized from 08/07/2014 through 08/21/2014 for ulcerative colitis flare. She had flexible sigmoidoscopy 08/08/2014 which demonstrated inflammatory changes from the rectum consistent with ulcerative colitis. She presented to [facility] long hospital with perianal pain started the night prior to the admission. CT scan on the admission demonstrated a complex perirectal infection extending up toward the retroperitoneal area.  08/21/13 underwent incision and drainage of the large issue rectal abscess on the right side. She was started on vancomycin and Zosyn. Postprocedure she was doing fine but has developed large amount of bleed on the morning of 08/23/2014. GI was consulted and pt was started on prednisone and mesalamine.  Hospital course is complicated with ongoing rectal bleed so Additionally, she was found to have left LE DVT On venous doppler done 7/12.   On 7/20, pt underwent flex sig with findings of significant L sided disease with GI recs for continued bowel rest, PO flagyl, increased steroids, and Remicade. The patient has since noted feeling improved.  Assessment/Plan: Sepsis secondary to peri-rectal abscess / leukocytosis  - Sepsis on admission with tachycardia, tachypnea, hypotension, leukocytosis. Source of infection is perirectal abscess. - S/P incision and drainage by surgery 08/23/2014 - Started on vancomycin and Zosyn at the time of the admission. Abscess culture demonstrated rare yeast and multiple other organisms/ none predominant - Blood cultures negative.  - Vancomycin was stopped 08/26/2014. Currently on fluconazole with flagyl - Dr. Megan Salon of infectious disease recommended total duration of antibiotics - 14 days with stop  date on 7/18- but due to stool in abscess cavity and possible fistula, abx duration to be deferred to Surgery. Surgery was originally planned for 7/21, however given recent flex sig findings of marked inflammatory disease - Pt with rising WBC and appears encephalopathic  Bloody diarrhea due to UC  - Patient had a rectal bleed after incision and drainage done 08/23/2014 which has continued -GI consulted and pt started on prednisone and mesalamine which was not effective in controlling diarrhea/ bleeding and therefore started on Remicaid 7/15 - Pt is now s/p flex sig with findings of significant disease. Discussed with GI with plans for continued bowel rest with immunomodulators. - Steroids decreased given concerns of continued abscess - Consideration for possible total colectomy and ileostomy if no significant improvement with medical management  Acute blood loss anemia  - Hb 12 on admission and 7.2 on 7/18- transfused 1 U PRBC - Pt continued with gradually decreasing hgb and brbpr. Hgb down to 7.5 on 7/24 and pt received one unit PRBC - Hgb remains stable in the mid-7 range   Essential hypertension - SBP was elevated in 150- 160s-stopped Hydralzine - Pt continued on Norvasc and toprol 50mg  - BP stable  B/L lower extremity DVT - Not a candidate for anticoagulation due to ongoing uncontrolled ulcerative colitis and rectal bleed - therefore, an IVC filter was placed   Hypokalemia/ hyponatremia - Likely from GI losses - sodium low today- continued on IVF   Generalized weakness  - Plan to ultimately d/c to skilled nursing facility once medically stable.  Hyponatremia - Question if related to tube feeds - urine and serum osm pending - urine cr of 20  Code Status: Full Family Communication: Pt in room Disposition Plan: Pending   Consultants:  GI  General Surgery  Procedures:  IVC filter   PICC line  venous duplex  Flex sig 7/20  Antibiotics: Fluconazole  7/8>>> Flagyl 7/19>>>  HPI/Subjective: Denies abd pain. No nausea  Objective: Filed Vitals:   09/10/14 2124 09/11/14 0027 09/11/14 0532 09/11/14 1424  BP: 124/71 128/76 125/62 120/76  Pulse: 91 73 76 78  Temp: 98.3 F (36.8 C) 98.2 F (36.8 C) 98.3 F (36.8 C) 98.2 F (36.8 C)  TempSrc: Oral Oral Oral Oral  Resp: 19 19 18 18   Height:      Weight:      SpO2: 95% 93% 97% 96%    Intake/Output Summary (Last 24 hours) at 09/11/14 1619 Last data filed at 09/11/14 0443  Gross per 24 hour  Intake    288 ml  Output    750 ml  Net   -462 ml   Filed Weights   08/23/14 0153 08/23/14 0434 09/10/14 7169  Weight: 102.3 kg (225 lb 8.5 oz) 104 kg (229 lb 4.5 oz) 107.3 kg (236 lb 8.9 oz)    Exam:   General:  Asleep, easily arousable, laying in bed, in nad  Cardiovascular: regular, s1, s2  Respiratory: normal resp effort, no wheezing  Abdomen: soft, pos BS, obese, nontender  Musculoskeletal: perfused, no clubbing, no cyanosis  Data Reviewed: Basic Metabolic Panel:  Recent Labs Lab 09/06/14 0410 09/07/14 0438 09/08/14 0433 09/09/14 0500 09/10/14 0822 09/11/14 0333  NA 127* 133* 131* 131* 128* 128*  K 4.4 4.3 4.0 4.2 4.4 4.5  CL 96* 97* 95* 93* 93* 96*  CO2 27 31 31 31 29 29   GLUCOSE 140* 155* 176* 176* 183* 182*  BUN 11 10 13 18  24* 30*  CREATININE 0.40* 0.46 0.43* 0.45 0.45 0.43*  CALCIUM 7.7* 7.7* 7.7* 8.0* 8.0* 7.6*  MG 1.4* 2.5*  --   --   --  1.7  PHOS 2.6 3.2  --   --   --  3.4   Liver Function Tests:  Recent Labs Lab 09/07/14 0438 09/11/14 0333  AST 29 28  ALT 19 27  ALKPHOS 108 82  BILITOT 0.3 0.5  PROT 5.1* 4.8*  ALBUMIN 1.8* 1.9*   No results for input(s): LIPASE, AMYLASE in the last 168 hours. No results for input(s): AMMONIA in the last 168 hours. CBC:  Recent Labs Lab 09/07/14 0438 09/08/14 0433 09/09/14 0500 09/10/14 0730 09/11/14 0333 09/11/14 0605  WBC 11.8* 15.4* 20.6* 29.9* 25.6*  --   NEUTROABS 10.6*  --   --   --  22.0*   --   HGB 8.2* 7.9* 7.7* 7.5* 7.4* 7.7*  HCT 26.1* 24.7* 24.0* 23.8* 22.3* 24.1*  MCV 94.9 96.5 96.0 96.4 91.8  --   PLT 362 363 378 384 293  --    Cardiac Enzymes: No results for input(s): CKTOTAL, CKMB, CKMBINDEX, TROPONINI in the last 168 hours. BNP (last 3 results) No results for input(s): BNP in the last 8760 hours.  ProBNP (last 3 results) No results for input(s): PROBNP in the last 8760 hours.  CBG:  Recent Labs Lab 09/10/14 2008 09/11/14 0011 09/11/14 0408 09/11/14 0752 09/11/14 1215  GLUCAP 191* 168* 197* 179* 208*    No results found for this or any previous visit (from the past 240 hour(s)).   Studies: No results found.  Scheduled Meds: . sodium chloride   Intravenous Once  . amLODipine  10 mg Oral Daily  . fluconazole (DIFLUCAN) IV  400 mg Intravenous Q24H  . insulin aspart  0-20 Units Subcutaneous Q4H  .  lip balm  1 application Topical BID  . mesalamine  4.8 g Oral Q breakfast  . methylPREDNISolone (SOLU-MEDROL) injection  20 mg Intravenous Q12H  . metoprolol  2.5 mg Intravenous 4 times per day  . metronidazole  250 mg Intravenous Q8H  . nystatin   Topical BID  . piperacillin-tazobactam (ZOSYN)  IV  3.375 g Intravenous 3 times per day  . saccharomyces boulardii  250 mg Oral BID  . sodium chloride  10-40 mL Intracatheter Q12H  . sodium chloride  3 mL Intravenous Q12H  . sodium chloride  3 mL Intravenous Q12H   Continuous Infusions: . Marland KitchenTPN (CLINIMIX-E) Adult 83 mL/hr at 09/10/14 1804   And  . fat emulsion 240 mL (09/10/14 1804)  . Marland KitchenTPN (CLINIMIX-E) Adult     And  . fat emulsion    . sodium chloride 10 mL/hr at 09/11/14 8242    Principal Problem:   Abscess, perirectal s/p I&D 08/23/2014 Active Problems:   Essential hypertension   Inflammatory bowel disease (IBD) with colitis   Sepsis   Infection due to yeast   Acute blood loss anemia   Leukocytosis   Hypokalemia   Hyponatremia   General weakness   Thrombocytopenia   Ulcerative colitis    DVT (deep venous thrombosis)   DVT (deep vein thrombosis) in pregnancy   Cold   Fasciitis   [facility], [facility] Pager [phone]. If 7PM-7AM, please contact night-coverage at [email or web address], password [facility] 09/11/2014, 4:19 PM  LOS: 20 days

## 2014-09-11 NOTE — Progress Notes (Signed)
[facility] NOTE  Pharmacy Consult for TPN Indication: severe IBD with fistula and abscess  Allergies  Allergen Reactions  . Clindamycin/Lincomycin Diarrhea    Leads to colitis flare  . Ivp Dye [Iodinated Diagnostic Agents] Other (See Comments)    "almost passed out"    Patient Measurements: Height: 5\' 9"  (175.3 cm) Weight: 236 lb 8.9 oz (107.3 kg) IBW/kg (Calculated) : 66.2 Adjusted Body Weight: 81.3kg  Vital Signs: Temp: 98.3 F (36.8 C) (07/25 0532) Temp Source: Oral (07/25 0532) BP: 125/62 mmHg (07/25 0532) Pulse Rate: 76 (07/25 0532) Intake/Output from previous day: 07/24 0701 - 07/25 0700 In: 538 [I.V.:250; Blood:288] Out: 1350 [Urine:1350] Intake/Output from this shift:    Labs:  Recent Labs  09/09/14 0500 09/10/14 0730 09/11/14 0333 09/11/14 0605  WBC 20.6* 29.9* 25.6*  --   HGB 7.7* 7.5* 7.4* 7.7*  HCT 24.0* 23.8* 22.3* 24.1*  PLT 378 384 293  --      Recent Labs  09/09/14 0500 09/10/14 0822 09/10/14 0945 09/11/14 0333  NA 131* 128*  --  128*  K 4.2 4.4  --  4.5  CL 93* 93*  --  96*  CO2 31 29  --  29  GLUCOSE 176* 183*  --  182*  BUN 18 24*  --  30*  CREATININE 0.45 0.45  --  0.43*  LABCREA  --   --  57.50  --   CALCIUM 8.0* 8.0*  --  7.6*  MG  --   --   --  1.7  PHOS  --   --   --  3.4  PROT  --   --   --  4.8*  ALBUMIN  --   --   --  1.9*  AST  --   --   --  28  ALT  --   --   --  27  ALKPHOS  --   --   --  82  BILITOT  --   --   --  0.5   Estimated Creatinine Clearance: 78 mL/min (by C-G formula based on Cr of 0.43).    Recent Labs  09/11/14 0011 09/11/14 0408 09/11/14 0752  GLUCAP 168* 197* 179*     Medications: Continuous Infusions: . Marland KitchenTPN (CLINIMIX-E) Adult 83 mL/hr at 09/10/14 1804   And  . fat emulsion 240 mL (09/10/14 1804)  . sodium chloride     Insulin Requirements:  Moderate SSI: 27 units / 24 hrs Insulin in TPN: 25 units  Current Nutrition:  Diet: NPO TPN @ 83 ml/hr, Lipids @ 10  ml/hr IVF: NS @ KVO   Central access: PICC placed 08/30/14 TPN start date:  7/20  ASSESSMENT                                                                                                          HPI:  74 y.o. female with past medical history of dyslipidemia, hypertension, long standing ulcerative colitis and recently hospitalized from 08/07/2014 through 08/21/2014 for ulcerative colitis flare.  She presented to  [facility] 7/5 for readmission d/t perianal pain.  S/p I&D on 08/21/13 of the large issue rectal abscess on the right side.  Further surgery (colectomy vs colostomy) postponed due to severe colitis.  Pharmacy consulted to start TPN for severe IBD with fistula and abscess.  Significant events:  7/5 I&D of complex perirectal abscess 7/20 flex sig: severe left sided colitis consistent with Crohn's colitis; flexi-seal removed.  TPN started. 7/22: TPN titrated to goal rate 7/25: CCS and GI considering colectomy and ileostomy.  Today:   Glucose (goal CBG's <150):  no history of diabetes, remains on Solumedrol TID; CBGs elevated (range179-203) and increased despite insulin added to TPN on 7/22, increased 7/23 and 7/24, and covered with SSI.  Electrolytes:  Na, Cl remain low, all others WNL  Renal:  SCr WNL and stable, CrCl 78 ml/min  LFTs:  WNL (7/21)  TGs: 128 (7/21), 109 (7/25) - stable  Prealbumin: 19.6 (7/21), 26.9 (7/25) - rising, possibly skewed by steroids  NUTRITIONAL GOALS                                                                                             RD recs (7/22): 1900-2100 Kcal/day, 100-110 g protein / day Clinimix E 5/15 at a goal rate of 83 ml/hr + 20% fat emulsion at 72ml/hr to provide: 100 g/day protein, 1894 Kcal/day.   PLAN                                                                                                                         At 1800 today:  Continue Clinimix E 5/15 at 83 ml/hr.  Insulin regular, increase to 35 units / 24  hours  TPN to contain standard multivitamins and trace elements.  Continue 20% fat emulsion at 10 ml/hr.  Continue NS at [facility].  Continue CBGs and resistant SSI q4h  TPN lab panels on Mondays & Thursdays.  F/u daily.   Peggyann Juba, PharmD, BCPS Pager: [phone]  09/11/2014 7:56 AM

## 2014-09-11 NOTE — Progress Notes (Signed)
5 Days Post-Op  Subjective: Still having blood from her rectum, no pain.  NPO on TNA.  Objective: Vital signs in last 24 hours: Temp:  [98 F (36.7 C)-98.7 F (37.1 C)] 98.3 F (36.8 C) (07/25 0532) Pulse Rate:  [73-91] 76 (07/25 0532) Resp:  [18-19] 18 (07/25 0532) BP: (114-128)/(56-76) 125/62 mmHg (07/25 0532) SpO2:  [93 %-98 %] 97 % (07/25 0532) Last BM Date: 09/10/14 Afebrile, VSS NA128 WBC still up to 25.6, H/H 7.7/24 Intake/Output from previous day: 07/24 0701 - 07/25 0700 In: 538 [I.V.:250; Blood:288] Out: 1350 [Urine:1350] Intake/Output this shift:    General appearance: alert, cooperative and no distress GI: soft, non-tender; bowel sounds normal; no masses,  no organomegaly  Lab Results:   Recent Labs  09/10/14 0730 09/11/14 0333 09/11/14 0605  WBC 29.9* 25.6*  --   HGB 7.5* 7.4* 7.7*  HCT 23.8* 22.3* 24.1*  PLT 384 293  --     BMET  Recent Labs  09/10/14 0822 09/11/14 0333  NA 128* 128*  K 4.4 4.5  CL 93* 96*  CO2 29 29  GLUCOSE 183* 182*  BUN 24* 30*  CREATININE 0.45 0.43*  CALCIUM 8.0* 7.6*   PT/INR No results for input(s): LABPROT, INR in the last 72 hours.   Recent Labs Lab 09/07/14 0438 09/11/14 0333  AST 29 28  ALT 19 27  ALKPHOS 108 82  BILITOT 0.3 0.5  PROT 5.1* 4.8*  ALBUMIN 1.8* 1.9*     Lipase     Component Value Date/Time   LIPASE 13* 08/07/2014 1201     Studies/Results: No results found.  Medications: . sodium chloride   Intravenous Once  . amLODipine  10 mg Oral Daily  . fluconazole (DIFLUCAN) IV  400 mg Intravenous Q24H  . insulin aspart  0-20 Units Subcutaneous Q4H  . lip balm  1 application Topical BID  . mesalamine  4.8 g Oral Q breakfast  . methylPREDNISolone (SOLU-MEDROL) injection  20 mg Intravenous Q12H  . metoprolol  2.5 mg Intravenous 4 times per day  . metronidazole  250 mg Intravenous Q8H  . nystatin   Topical BID  . saccharomyces boulardii  250 mg Oral BID  . sodium chloride  10-40 mL  Intracatheter Q12H  . sodium chloride  3 mL Intravenous Q12H  . sodium chloride  3 mL Intravenous Q12H   . Marland KitchenTPN (CLINIMIX-E) Adult 83 mL/hr at 09/10/14 1804   And  . fat emulsion 240 mL (09/10/14 1804)  . Marland KitchenTPN (CLINIMIX-E) Adult     And  . fat emulsion    . sodium chloride 10 mL/hr at 09/11/14 0950   1. Colon, biopsy, splenic flexure - CHRONIC MILDLY ACTIVE COLITIS CONSISTENT WITH INFLAMMATORY BOWEL DISEASE. - NEGATIVE FOR DYSPLASIA. 2. Colon, biopsy, sigmoid - CHRONIC MODERATELY ACTIVE COLITIS CONSISTENT WITH INFLAMMATORY BOWEL DISEASE. - NEGATIVE FOR DYSPLASIA. Assessment/Plan 1.  Ulcerative colitis vs Crohn's colitis 2.  Perirectal abscess and fistula 3.  Flexible Sigmoidoscopy with bx 09/06/14 Dr. Olevia Perches Findings:  there is a severe left-sided colitis extending from anal canal to splenic flexure at 80 cm ,consistent with Crohn's colitis. There is no normal appearing mucosa. The bleeding is originating along the left colon from the large ulcerations. 4.  Remicade 1 dose 09/01/14 5.  DVT with IVC filter 08/30/14 7.  Anemia and ongoing bloody stools 8.  Antibiotics;  Ongoing from 08/07/14 including Vancomycin, Flagyl, started on Zosyn 08/22/14; fluconazole, started on 08/22/14. 9.  DVT:  IVC filter/SCD 10.  Malnutrition  on TNA   Plan:  She is stable currently, with ongoing elevated WBC, surgery is becoming more of an issue.  REmicade was given on 7/15.  Dr. Barry Dienes plans to talk with GI and make recommendations after that discussion.       LOS: 20 days    Ranger Petrich 09/11/2014

## 2014-09-11 NOTE — Progress Notes (Signed)
Clinical Social Work  Patient was discussed during progression meeting and MD reports patient is not medically stable to DC. CSW will continue to follow and will assist with SNF placement once patient is more medically stable.  [place], Ali Chuk [phone]

## 2014-09-12 DIAGNOSIS — B379 Candidiasis, unspecified: Secondary | ICD-10-CM

## 2014-09-12 LAB — BASIC METABOLIC PANEL
Anion gap: 3 — ABNORMAL LOW (ref 5–15)
BUN: 29 mg/dL — ABNORMAL HIGH (ref 6–20)
CO2: 29 mmol/L (ref 22–32)
Calcium: 7.4 mg/dL — ABNORMAL LOW (ref 8.9–10.3)
Chloride: 94 mmol/L — ABNORMAL LOW (ref 101–111)
Creatinine, Ser: 0.46 mg/dL (ref 0.44–1.00)
GFR calc Af Amer: >60 mL/min (ref >60)
GFR calc non Af Amer: >60 mL/min (ref >60)
Glucose, Bld: 153 mg/dL — ABNORMAL HIGH (ref 65–99)
Potassium: 4.7 mmol/L (ref 3.5–5.1)
Sodium: 126 mmol/L — ABNORMAL LOW (ref 135–145)

## 2014-09-12 LAB — GLUCOSE, CAPILLARY
GLUCOSE-CAPILLARY: 142 mg/dL — AB (ref 65–99)
GLUCOSE-CAPILLARY: 159 mg/dL — AB (ref 65–99)
Glucose-Capillary: 165 mg/dL — ABNORMAL HIGH (ref 65–99)
Glucose-Capillary: 148 mg/dL — ABNORMAL HIGH (ref 65–99)
Glucose-Capillary: 155 mg/dL — ABNORMAL HIGH (ref 65–99)
Glucose-Capillary: 164 mg/dL — ABNORMAL HIGH (ref 65–99)

## 2014-09-12 LAB — CBC
HEMATOCRIT: 22.5 % — AB (ref 36.0–46.0)
Hemoglobin: 7.4 g/dL — ABNORMAL LOW (ref 12.0–15.0)
MCH: 31 pg (ref 26.0–34.0)
MCHC: 32.9 g/dL (ref 30.0–36.0)
MCV: 94.1 fL (ref 78.0–100.0)
PLATELETS: 240 10*3/uL (ref 150–400)
RBC: 2.39 MIL/uL — AB (ref 3.87–5.11)
RDW: 18.9 % — ABNORMAL HIGH (ref 11.5–15.5)
WBC: 23.7 10*3/uL — AB (ref 4.0–10.5)

## 2014-09-12 LAB — PREPARE RBC (CROSSMATCH)

## 2014-09-12 MED ORDER — FAT EMULSION 20 % IV EMUL
240.0000 mL | INTRAVENOUS | Status: DC
  Filled 2014-09-12: qty 250

## 2014-09-12 MED ORDER — SODIUM CHLORIDE 0.9 % IV SOLN
Freq: Once | INTRAVENOUS | Status: AC
  Administered 2014-09-12: 10:00:00 via INTRAVENOUS

## 2014-09-12 MED ORDER — TRACE MINERALS CR-CU-MN-SE-ZN 10-1000-500-60 MCG/ML IV SOLN
INTRAVENOUS | Status: DC
  Filled 2014-09-12: qty 1992

## 2014-09-12 MED ORDER — FAT EMULSION 20 % IV EMUL
240.0000 mL | INTRAVENOUS | Status: AC
  Administered 2014-09-12: 240 mL via INTRAVENOUS
  Filled 2014-09-12: qty 250

## 2014-09-12 MED ORDER — METHYLPREDNISOLONE SODIUM SUCC 40 MG IJ SOLR
20.0000 mg | INTRAMUSCULAR | Status: DC
  Administered 2014-09-13 – 2014-09-15 (×3): 20 mg via INTRAVENOUS
  Filled 2014-09-12 (×4): qty 0.5

## 2014-09-12 MED ORDER — TRACE MINERALS CR-CU-MN-SE-ZN 10-1000-500-60 MCG/ML IV SOLN
INTRAVENOUS | Status: AC
  Administered 2014-09-12: 18:00:00 via INTRAVENOUS
  Filled 2014-09-12: qty 1992

## 2014-09-12 NOTE — Progress Notes (Addendum)
[facility] NOTE  Pharmacy Consult for TPN Indication: severe IBD with fistula and abscess  Allergies  Allergen Reactions  . Clindamycin/Lincomycin Diarrhea    Leads to colitis flare  . Ivp Dye [Iodinated Diagnostic Agents] Other (See Comments)    "almost passed out"    Patient Measurements: Height: 5\' 9"  (175.3 cm) Weight: 236 lb 8.9 oz (107.3 kg) IBW/kg (Calculated) : 66.2 Adjusted Body Weight: 81.3kg  Vital Signs: Temp: 98.3 F (36.8 C) (07/26 0419) Temp Source: Oral (07/26 0419) BP: 127/69 mmHg (07/26 0419) Pulse Rate: 81 (07/26 0419) Intake/Output from previous day: 07/25 0701 - 07/26 0700 In: -  Out: 1825 [Urine:1825] Intake/Output from this shift:    Labs:  Recent Labs  09/10/14 0730 09/11/14 0333 09/11/14 0605 09/12/14 0820  WBC 29.9* 25.6*  --  23.7*  HGB 7.5* 7.4* 7.7* 7.4*  HCT 23.8* 22.3* 24.1* 22.5*  PLT 384 293  --  240     Recent Labs  09/10/14 0822 09/10/14 0945 09/11/14 0333 09/11/14 0343 09/12/14 0820  NA 128*  --  128*  --  126*  K 4.4  --  4.5  --  4.7  CL 93*  --  96*  --  94*  CO2 29  --  29  --  29  GLUCOSE 183*  --  182*  --  153*  BUN 24*  --  30*  --  29*  CREATININE 0.45  --  0.43*  --  0.46  LABCREA  --  57.50  --   --   --   CALCIUM 8.0*  --  7.6*  --  7.4*  MG  --   --  1.7  --   --   PHOS  --   --  3.4  --   --   PROT  --   --  4.8*  --   --   ALBUMIN  --   --  1.9*  --   --   AST  --   --  28  --   --   ALT  --   --  27  --   --   ALKPHOS  --   --  82  --   --   BILITOT  --   --  0.5  --   --   PREALBUMIN  --   --   --  26.9  --   TRIG  --   --  109  --   --    Estimated Creatinine Clearance: 78 mL/min (by C-G formula based on Cr of 0.46).    Recent Labs  09/12/14 0417 09/12/14 0749 09/12/14 1134  GLUCAP 165* 148* 155*     Medications: Continuous Infusions: . Marland KitchenTPN (CLINIMIX-E) Adult 83 mL/hr at 09/11/14 1723   And  . fat emulsion 240 mL (09/11/14 1724)  . Marland KitchenTPN (CLINIMIX-E) Adult      And  . fat emulsion    . sodium chloride 10 mL/hr at 09/11/14 0950   Insulin Requirements:  Resistant SSI: 25 units / 24 hrs Insulin in TPN: 35 units  Current Nutrition:  Diet: NPO TPN @ 83 ml/hr, Lipids @ 10 ml/hr IVF: NS @ KVO   Central access: PICC placed 08/30/14 TPN start date:  7/20  ASSESSMENT  HPI:  74 y.o. female with past medical history of dyslipidemia, hypertension, long standing ulcerative colitis and recently hospitalized from 08/07/2014 through 08/21/2014 for ulcerative colitis flare.  She presented to [facility] 7/5 for readmission d/t perianal pain.  S/p I&D on 08/21/13 of the large issue rectal abscess on the right side.  Further surgery (colectomy vs colostomy) postponed due to severe colitis.  Pharmacy consulted to start TPN for severe IBD with fistula and abscess.  Significant events:  7/5 I&D of complex perirectal abscess 7/20 flex sig: severe left sided colitis consistent with Crohn's colitis; flexi-seal removed.  TPN started. 7/22: TPN titrated to goal rate 7/26: CCS planning for laparoscopic diverting ileostomy tomorrow  Today:   Glucose (goal CBGs <150):  no history of diabetes, remains on Solumedrol, being weaned; CBGs elevated (range 148-208) but more controlled. Insulin added to TPN on 7/22, increased 7/23, 7/24, and 7/25. Also covered with SSI.  Electrolytes:  Na, Cl remain low and trending down, all others WNL including Corrected Ca  Renal:  SCr WNL and stable, CrCl 78 ml/min  LFTs:  WNL (7/25)  TGs: 128 (7/21), 109 (7/25) - stable  Prealbumin: 19.6 (7/21), 26.9 (7/25) - rising, possibly skewed by steroids  NUTRITIONAL GOALS                                                                                             RD recs (7/26): 1900-2100 Kcal/day, 100-110 g protein / day Clinimix E 5/15 at a goal rate of 83 ml/hr + 20% fat  emulsion at 79ml/hr to provide: 100 g/day protein, 1894 Kcal/day.   PLAN                                                                                                                         At 1800 today:  Continue Clinimix E 5/15 at goal rate of 83 ml/hr.  Add regular insulin, increase to 40 units/24 hours.  Add 42 mEq/L NaCl to make TPN concentration 0.45%NS.  TPN to contain standard multivitamins and trace elements.  Continue 20% fat emulsion at 10 ml/hr.  Continue NS at [facility].  Continue CBGs and resistant SSI q4h  TPN lab panels on Mondays & Thursdays.  CMET in AM.  F/u daily.    Lindell Spar, PharmD, BCPS Pager: [phone] 09/12/2014 1:24 PM

## 2014-09-12 NOTE — Progress Notes (Addendum)
[facility] PROGRESS NOTE  [name] NID:[id number] DOB: 27-Jan-1941 DOA: 08/22/2014 PCP: Kandice Hams, MD  Brief narrative: 74 y.o. female with past medical history of dyslipidemia, hypertension, long standing ulcerative colitis and recently hospitalized from 08/07/2014 through 08/21/2014 for ulcerative colitis flare. She had flexible sigmoidoscopy 08/08/2014 which demonstrated inflammatory changes from the rectum consistent with ulcerative colitis. She presented to [facility] long hospital with perianal pain started the night prior to the admission. CT scan on the admission demonstrated a complex perirectal infection extending up toward the retroperitoneal area.  08/21/13 underwent incision and drainage of the large issue rectal abscess on the right side. She was started on vancomycin and Zosyn. Postprocedure she was doing fine but has developed large amount of bleed on the morning of 08/23/2014. GI was consulted and pt was started on prednisone and mesalamine.  Hospital course is complicated with ongoing rectal bleed so Additionally, she was found to have left LE DVT On venous doppler done 7/12.   On 7/20, pt underwent flex sig with findings of significant L sided disease with GI recs for continued bowel rest, PO flagyl, increased steroids, and Remicade. After several days of bowel rest, pt's WBC rose and pt's steroids were tapered to allow fighting off of infection. General Surgery has since recommended a laproscopic diverting ileostomy tentatively scheduled for 7/27  Assessment/Plan: Sepsis secondary to peri-rectal abscess / leukocytosis  - Sepsis on admission with tachycardia, tachypnea, hypotension, leukocytosis. Source of infection is perirectal abscess. - S/P incision and drainage by surgery 08/23/2014 - Started on vancomycin and Zosyn at the time of the admission. Abscess culture demonstrated rare yeast and multiple other organisms/ none predominant - Blood cultures negative.  -  Vancomycin was stopped 08/26/2014. Had been continued on fluconazole with flagyl - Dr. Megan Salon of infectious disease recommended total duration of antibiotics - 14 days with stop date on 7/18- but due to stool in abscess cavity and possible fistula, abx duration to be deferred to Surgery. Surgery was originally planned for 7/21, however given recent flex sig findings of marked inflammatory disease - Pt recently noted to have rising WBC and appeared encephalopathic, thus zosyn was added. D/c flagyl for now - As of 7/26, plans for laparoscopic diverting ileostomy for 7/27  Bloody diarrhea due to UC  - Patient had a rectal bleed after incision and drainage done 08/23/2014 which has continued -GI consulted and pt started on prednisone and mesalamine which was not effective in controlling diarrhea/ bleeding and therefore started on Remicaid 7/15 - Pt is now s/p flex sig with findings of significant disease. Discussed with GI with plans for continued bowel rest with immunomodulators. - Steroids tapering given concerns of continued abscess - Plans for surgery as per above  Acute blood loss anemia  - Hb 12 on admission and 7.2 on 7/18- transfused 1 U PRBC - Pt continued with gradually decreasing hgb and brbpr. Hgb down to 7.5 on 7/24 and pt received one unit PRBC - Hgb remains stable in the mid-7 range with active bleeding - transfuse an additional unit of PRBC today   Essential hypertension - SBP was elevated in 150- 160s-stopped Hydralzine - Pt continued on Norvasc and toprol 50mg  - BP stable  B/L lower extremity DVT - Not a candidate for anticoagulation due to ongoing uncontrolled ulcerative colitis and rectal bleed - therefore, an IVC filter was placed   Hypokalemia/ hyponatremia - Likely from GI losses - sodium low today- continued on IVF   Generalized weakness  - Plan  to ultimately d/c to skilled nursing facility once medically stable.  Hyponatremia - Question if related to  tube feeds - urine and serum osm pending - urine cr of 20 - d/w pharmacy to tailor tube feeds to manage sodium  Code Status: Full Family Communication: Pt in room Disposition Plan: Pending   Consultants:  GI  General Surgery  Procedures:  IVC filter   PICC line  venous duplex  Flex sig 7/20  Antibiotics: Fluconazole 7/8>>> Flagyl 7/19>>>  HPI/Subjective: Patient is without complaints. No nausea  Objective: Filed Vitals:   09/11/14 0532 09/11/14 1424 09/11/14 2108 09/12/14 0419  BP: 125/62 120/76 137/64 127/69  Pulse: 76 78 81 81  Temp: 98.3 F (36.8 C) 98.2 F (36.8 C) 98.3 F (36.8 C) 98.3 F (36.8 C)  TempSrc: Oral Oral Oral Oral  Resp: 18 18 18 18   Height:      Weight:      SpO2: 97% 96% 97% 97%    Intake/Output Summary (Last 24 hours) at 09/12/14 1339 Last data filed at 09/12/14 0900  Gross per 24 hour  Intake      0 ml  Output   1825 ml  Net  -1825 ml   Filed Weights   08/23/14 0153 08/23/14 0434 09/10/14 7322  Weight: 102.3 kg (225 lb 8.5 oz) 104 kg (229 lb 4.5 oz) 107.3 kg (236 lb 8.9 oz)    Exam:   General:  Asleep, easily arousable, laying in bed, in nad  Cardiovascular: regular, s1, s2  Respiratory: normal resp effort, no wheezing  Abdomen: soft, pos BS, obese, nontender  Musculoskeletal: perfused, no clubbing Data Reviewed: Basic Metabolic Panel:  Recent Labs Lab 09/06/14 0410 09/07/14 0438 09/08/14 0433 09/09/14 0500 09/10/14 0822 09/11/14 0333 09/12/14 0820  NA 127* 133* 131* 131* 128* 128* 126*  K 4.4 4.3 4.0 4.2 4.4 4.5 4.7  CL 96* 97* 95* 93* 93* 96* 94*  CO2 27 31 31 31 29 29 29   GLUCOSE 140* 155* 176* 176* 183* 182* 153*  BUN 11 10 13 18  24* 30* 29*  CREATININE 0.40* 0.46 0.43* 0.45 0.45 0.43* 0.46  CALCIUM 7.7* 7.7* 7.7* 8.0* 8.0* 7.6* 7.4*  MG 1.4* 2.5*  --   --   --  1.7  --   PHOS 2.6 3.2  --   --   --  3.4  --    Liver Function Tests:  Recent Labs Lab 09/07/14 0438 09/11/14 0333  AST 29 28   ALT 19 27  ALKPHOS 108 82  BILITOT 0.3 0.5  PROT 5.1* 4.8*  ALBUMIN 1.8* 1.9*   No results for input(s): LIPASE, AMYLASE in the last 168 hours. No results for input(s): AMMONIA in the last 168 hours. CBC:  Recent Labs Lab 09/07/14 0438 09/08/14 0433 09/09/14 0500 09/10/14 0730 09/11/14 0333 09/11/14 0605 09/12/14 0820  WBC 11.8* 15.4* 20.6* 29.9* 25.6*  --  23.7*  NEUTROABS 10.6*  --   --   --  22.0*  --   --   HGB 8.2* 7.9* 7.7* 7.5* 7.4* 7.7* 7.4*  HCT 26.1* 24.7* 24.0* 23.8* 22.3* 24.1* 22.5*  MCV 94.9 96.5 96.0 96.4 91.8  --  94.1  PLT 362 363 378 384 293  --  240   Cardiac Enzymes: No results for input(s): CKTOTAL, CKMB, CKMBINDEX, TROPONINI in the last 168 hours. BNP (last 3 results) No results for input(s): BNP in the last 8760 hours.  ProBNP (last 3 results) No results for input(s): PROBNP in  the last 8760 hours.  CBG:  Recent Labs Lab 09/11/14 2000 09/11/14 2355 09/12/14 0417 09/12/14 0749 09/12/14 1134  GLUCAP 148* 159* 165* 148* 155*    No results found for this or any previous visit (from the past 240 hour(s)).   Studies: No results found.  Scheduled Meds: . sodium chloride   Intravenous Once  . amLODipine  10 mg Oral Daily  . fluconazole (DIFLUCAN) IV  400 mg Intravenous Q24H  . insulin aspart  0-20 Units Subcutaneous Q4H  . lip balm  1 application Topical BID  . mesalamine  4.8 g Oral Q breakfast  . [START ON 09/13/2014] methylPREDNISolone (SOLU-MEDROL) injection  20 mg Intravenous Q24H  . metoprolol  2.5 mg Intravenous 4 times per day  . nystatin   Topical BID  . piperacillin-tazobactam (ZOSYN)  IV  3.375 g Intravenous 3 times per day  . saccharomyces boulardii  250 mg Oral BID  . sodium chloride  10-40 mL Intracatheter Q12H  . sodium chloride  3 mL Intravenous Q12H  . sodium chloride  3 mL Intravenous Q12H   Continuous Infusions: . Marland KitchenTPN (CLINIMIX-E) Adult 83 mL/hr at 09/11/14 1723   And  . fat emulsion 240 mL (09/11/14 1724)  .  Marland KitchenTPN (CLINIMIX-E) Adult     And  . fat emulsion    . sodium chloride 10 mL/hr at 09/11/14 0349    Principal Problem:   Abscess, perirectal s/p I&D 08/23/2014 Active Problems:   Essential hypertension   Inflammatory bowel disease (IBD) with colitis   Sepsis   Infection due to yeast   Acute blood loss anemia   Leukocytosis   Hypokalemia   Hyponatremia   General weakness   Thrombocytopenia   Ulcerative colitis   DVT (deep venous thrombosis)   DVT (deep vein thrombosis) in pregnancy   Cold   Fasciitis   Napolean Sia, [facility] Pager [phone]. If 7PM-7AM, please contact night-coverage at [email or web address], password [facility] 09/12/2014, 1:39 PM  LOS: 21 days

## 2014-09-12 NOTE — Consult Note (Signed)
WOC requested for preoperative stoma site marking  Discussed surgical procedure and stoma creation with patient. Pt has been previously marked for colostomy by the [facility] team so she is familiar with ostomy and our services.    Examined patient lying, sitting, (pt not able to stand)in order to place the marking in the patient's visual field, away from any creases or abdominal contour issues and within the rectus muscle.  Attempted to mark below the patient's belt line.    Marked for ileostomy in the RLQ  _5cm ___cm to the right of the umbilicus and  __8__ cm below the umbilicus.  Covered mark with thin film transparent dressing to preserve mark until date of surgery  WOC team will follow up with patient after surgery for continue ostomy care and teaching.  Jonnie Truxillo [place] RN,CWOCN [phone]

## 2014-09-12 NOTE — Anesthesia Preprocedure Evaluation (Addendum)
Anesthesia Evaluation  Patient identified by MRN, date of birth, ID band Patient awake    Reviewed: Allergy & Precautions, H&P , NPO status , Patient's Chart, lab work & pertinent test results, reviewed documented beta blocker date and time   Airway Mallampati: II  TM Distance: >3 FB Neck ROM: full    Dental  (+) Dental Advisory Given, Edentulous Upper, Edentulous Lower   Pulmonary neg pulmonary ROS, former smoker,  breath sounds clear to auscultation  Pulmonary exam normal       Cardiovascular Exercise Tolerance: Good hypertension, Pt. on home beta blockers and Pt. on medications + CAD, + Past MI, + CABG and + Peripheral Vascular Disease Normal cardiovascular examRhythm:regular Rate:Normal     Neuro/Psych Carotid artery occlusion.  Cerebrovascular disease negative neurological ROS  negative psych ROS   GI/Hepatic negative GI ROS, Neg liver ROS,   Endo/Other  diabetesBorderline DM  Renal/GU negative Renal ROS  negative genitourinary   Musculoskeletal   Abdominal   Peds  Hematology  (+) anemia ,   Anesthesia Other Findings Hyponatremia, hypocalcemia, hypochloremia - likely due to GI losses, patient is on TPN  Hgb 8.8 - rectal losses, last RBC unit 7/26  Reproductive/Obstetrics negative OB ROS                       Anesthesia Physical  Anesthesia Plan  ASA: IV and emergent  Anesthesia Plan: General   Post-op Pain Management:    Induction: Intravenous  Airway Management Planned: Oral ETT  Additional Equipment:   Intra-op Plan:   Post-operative Plan:   Informed Consent: I have reviewed the patients History and Physical, chart, labs and discussed the procedure including the risks, benefits and alternatives for the proposed anesthesia with the patient or authorized representative who has indicated his/her understanding and acceptance.   Dental Advisory Given  Plan Discussed with: CRNA,  Surgeon and Anesthesiologist  Anesthesia Plan Comments: (Will continue zosyn, fluconazole Received daily methylprednisone this am Will continue long acting Morphine PRN, continue BB coverage as needed for goal HR <100 periop Hgb improved this am, possible need for transfusion )     Anesthesia Quick Evaluation

## 2014-09-12 NOTE — Progress Notes (Signed)
     [facility] Progress Note  Subjective:     On flagyl and zosyn. WBC 23.7 this am. NPO on TNA.                                                                         Had remicade 09/01/14. No pain, still with some blood from rectum.  Objective:  Vital signs in last 24 hours: Temp:  [98.2 F (36.8 C)-98.3 F (36.8 C)] 98.3 F (36.8 C) (07/26 0419) Pulse Rate:  [78-81] 81 (07/26 0419) Resp:  [18] 18 (07/26 0419) BP: (120-137)/(64-76) 127/69 mmHg (07/26 0419) SpO2:  [96 %-97 %] 97 % (07/26 0419) Last BM Date: 09/10/14 General:   Alert, well-developed, in NAD, Heart:  Regular rate and rhythm; no murmurs Pulm;lungs clear Abdomen:  Soft, nontender, nondistended. Normal bowel sounds, without guarding, and without rebound.   Extremities:  Without edema. Neurologic:  Alert and  oriented x4;  grossly normal neurologically. Psych:  Alert and cooperative. Normal mood and affect.  Intake/Output from previous day: 07/25 0701 - 07/26 0700 In: -  Out: 1825 [Urine:1825] Intake/Output this shift:    Lab Results:  Recent Labs  09/10/14 0730 09/11/14 0333 09/11/14 0605 09/12/14 0820  WBC 29.9* 25.6*  --  23.7*  HGB 7.5* 7.4* 7.7* 7.4*  HCT 23.8* 22.3* 24.1* 22.5*  PLT 384 293  --  240   BMET  Recent Labs  09/10/14 0822 09/11/14 0333  NA 128* 128*  K 4.4 4.5  CL 93* 96*  CO2 29 29  GLUCOSE 183* 182*  BUN 24* 30*  CREATININE 0.45 0.43*  CALCIUM 8.0* 7.6*   LFT  Recent Labs  09/11/14 0333  PROT 4.8*  ALBUMIN 1.9*  AST 28  ALT 27  ALKPHOS 82  BILITOT 0.5    Flex sig 7/20 1. Colon, biopsy, splenic flexure - CHRONIC MILDLY ACTIVE COLITIS CONSISTENT WITH INFLAMMATORY BOWEL DISEASE. - NEGATIVE FOR DYSPLASIA. 2. Colon, biopsy, sigmoid - CHRONIC MODERATELY ACTIVE COLITIS CONSISTENT WITH INFLAMMATORY BOWEL DISEASE. - NEGATIVE FOR DYSPLASIA.                           ASSESSMENT/PLAN:   74 yo female with LGIB secondary to IBD with severe colitis.  Rectal abscess not healing-needs stool stream diverted or resection. On Lialda, Solumedrol, Flagyl and Zosyn.Continue local wound care.    LOS: 21 days   Hvozdovic, Deloris Ping 09/12/2014, Pager [phone]     Attending physician's note   I have taken an interval history, reviewed the chart and examined the patient. I agree with the Advanced Practitioner's note, impression and recommendations. IBD with severe colitis, rectal abscess and fistula. Remicade given on 7/15. Will again decrease Solumedrol today and attempt to continue to taper dose to help with infection, post op healing. Surgical plans noted for diverting loop ileostomy and possible additional treatment of rectal disease tomorrow.  Pricilla Riffle. Fuller Plan, MD [facility]

## 2014-09-12 NOTE — Progress Notes (Signed)
Nutrition Follow-up  DOCUMENTATION CODES:   Obesity unspecified  INTERVENTION:  - Continue TPN per pharmacy - Advance diet as medically feasible following surgery - RD will continue to monitor for needs  NUTRITION DIAGNOSIS:   Altered GI function related to acute illness as evidenced by other (see comment) (fistula requiring TPN). -ongoing  GOAL:   Patient will meet greater than or equal to 90% of their needs -met with TPN  MONITOR:   Weight trends, Labs, I & O's, Other (Comment) (TPN regimen)  ASSESSMENT:  74 y.o. female with past medical history of dyslipidemia, hypertension, long standing ulcerative colitis and recently hospitalized from 08/07/2014 through 08/21/2014 for ulcerative colitis flare. She had flexible sigmoidoscopy 08/08/2014 which demonstrated inflammatory changes from the rectum consistent with ulcerative colitis. She presented to [facility] hospital with perianal pain started the night prior to the admission. CT scan on the admission demonstrated a complex perirectal infection extending up toward the retroperitoneal area.  7/26 - Pt has been NPO since midnight pending diagnostic laparoscopy with diverting loop ileostomy and possible EUA today - Pt continues with Clinimix E 5/15 @ 83 mL/hr and 20% lipids @ 10 mL/hr which is providing 1894 kcal, 100 grams protein - Meeting needs with TPN alone; will monitor for diet advancement following surgery - Medications reviewed. Labs reviewed; CBGs: 148-208 mg/dL, Na: 126 mmol/L, Cl: 94 mmol/L, BUN elevated, Ca: 7.4 mg/dL.   7/22 Pt currently receiving Clinimix E 5/15 @ 60 ml/hr with 20% lipids @ 10 mL/hr which is providing 1502 kcal, 72 grams of protein; not meeting needs.  Plan per pharmacy for today: At 1800 today:  Increase Clinimix E 5/15 to 83 ml/hr   Continue 20% fat emulsion at 35m/hr.  TPN to contain standard multivitamins and trace elements.  Provide insulin 15 units/ 24hrs via TPN  Continue IVF at 10  ml/hr.  Continue moderate SSI q4h .  TPN lab panels on Mondays & Thursdays.  7/21 On 7/5 pt underwent I&D of large, R-sided rectal abscess and on 7/20 underwent flex sig which indicated significant L sided disease. [place], pt to undergo colostomy placement today (7/21).  Goal rate per pharmacy: Clinimix E 5/15 @ 70 mL/hr with 20% lipids @ 10 mL/hr which will provide 84 grams protein and 1673 kcal which will not meet needs. Recommend increase to 83 mL/hr which will provide 1894 kcal and 100 grams protein.     Diet Order:  Diet - low sodium heart healthy .TPN (CLINIMIX-E) Adult Diet NPO time specified  Skin:  Wound (see comment) (perineum surgical incision)  Last BM:  7/25  Height:   Ht Readings from Last 1 Encounters:  08/22/14 _0  (1.753 m)    Weight:   Wt Readings from Last 1 Encounters:  09/10/14 236 lb 8.9 oz (107.3 kg)    Ideal Body Weight:  72.73 kg (kg)  Wt Readings from Last 10 Encounters:  09/10/14 236 lb 8.9 oz (107.3 kg)  08/07/14 229 lb 12.8 oz (104.237 kg)  08/03/14 227 lb 4 oz (103.08 kg)  07/22/14 220 lb (99.791 kg)  03/28/14 234 lb 1.6 oz (106.187 kg)  03/02/14 233 lb (105.688 kg)  05/17/13 224 lb 6.4 oz (101.787 kg)  03/04/13 223 lb (101.152 kg)  02/25/13 218 lb 8 oz (99.111 kg)  02/24/13 222 lb (100.699 kg)    BMI:  Body mass index is 34.92 kg/(m^2).  Estimated Nutritional Needs:   Kcal:  1900-2100  Protein:  100-110 grams  Fluid:  2.2-2.5 L/day  EDUCATION NEEDS:   No education needs identified at this time     Jarome Matin, [place], [facility] Inpatient Clinical Dietitian Pager # [phone] After hours/weekend pager # [phone]

## 2014-09-12 NOTE — Progress Notes (Signed)
Patient ID: [name], female   DOB: 05-24-1940, 74 y.o.   MRN: [id number] 6 Days Post-Op  Subjective: Pt seems ok today.  Doesn't complain of anything specific.  Says she's not eating much.    Objective: Vital signs in last 24 hours: Temp:  [98.2 F (36.8 C)-98.3 F (36.8 C)] 98.3 F (36.8 C) (07/26 0419) Pulse Rate:  [78-81] 81 (07/26 0419) Resp:  [18] 18 (07/26 0419) BP: (120-137)/(64-76) 127/69 mmHg (07/26 0419) SpO2:  [96 %-97 %] 97 % (07/26 0419) Last BM Date: 09/10/14  Intake/Output from previous day: 07/25 0701 - 07/26 0700 In: -  Out: 6734 [Urine:1825] Intake/Output this shift:    PE: Abd: soft, NT, ND, +BS Rectal: tried to eval her rectal disease, but she had unknowingly stooled on herself.  Unable to see her rectal disease.  Her stool is liquid and mixed with blood  Lab Results:   Recent Labs  09/11/14 0333 09/11/14 0605 09/12/14 0820  WBC 25.6*  --  23.7*  HGB 7.4* 7.7* 7.4*  HCT 22.3* 24.1* 22.5*  PLT 293  --  240   BMET  Recent Labs  09/11/14 0333 09/12/14 0820  NA 128* 126*  K 4.5 4.7  CL 96* 94*  CO2 29 29  GLUCOSE 182* 153*  BUN 30* 29*  CREATININE 0.43* 0.46  CALCIUM 7.6* 7.4*   PT/INR No results for input(s): LABPROT, INR in the last 72 hours. CMP     Component Value Date/Time   NA 126* 09/12/2014 0820   K 4.7 09/12/2014 0820   CL 94* 09/12/2014 0820   CO2 29 09/12/2014 0820   GLUCOSE 153* 09/12/2014 0820   BUN 29* 09/12/2014 0820   CREATININE 0.46 09/12/2014 0820   CREATININE 1.08 03/28/2014 1022   CALCIUM 7.4* 09/12/2014 0820   PROT 4.8* 09/11/2014 0333   ALBUMIN 1.9* 09/11/2014 0333   AST 28 09/11/2014 0333   ALT 27 09/11/2014 0333   ALKPHOS 82 09/11/2014 0333   BILITOT 0.5 09/11/2014 0333   GFRNONAA >60 09/12/2014 0820   GFRNONAA 50* 03/28/2014 1022   GFRAA >60 09/12/2014 0820   GFRAA 58* 03/28/2014 1022   Lipase     Component Value Date/Time   LIPASE 13* 08/07/2014 1201       Studies/Results: No results  found.  Anti-infectives: Anti-infectives    Start     Dose/Rate Route Frequency Ordered Stop   09/11/14 1600  piperacillin-tazobactam (ZOSYN) IVPB 3.375 g     3.375 g 12.5 mL/hr over 240 Minutes Intravenous 3 times per day 09/11/14 1523     09/07/14 1200  fluconazole (DIFLUCAN) IVPB 400 mg     400 mg 100 mL/hr over 120 Minutes Intravenous Every 24 hours 09/06/14 2117     09/06/14 2200  metroNIDAZOLE (FLAGYL) IVPB 250 mg     250 mg 50 mL/hr over 60 Minutes Intravenous Every 8 hours 09/06/14 2117     09/05/14 1500  metroNIDAZOLE (FLAGYL) tablet 250 mg  Status:  Discontinued     250 mg Oral 3 times per day 09/05/14 1440 09/06/14 2117   08/28/14 0000  fluconazole (DIFLUCAN) 200 MG tablet     400 mg Oral Daily 08/28/14 1052     08/28/14 0000  amoxicillin-clavulanate (AUGMENTIN) 875-125 MG per tablet     1 tablet Oral 2 times daily 08/28/14 1052     08/25/14 2000  vancomycin (VANCOCIN) IVPB 1000 mg/200 mL premix  Status:  Discontinued     1,000 mg 200 mL/hr  over 60 Minutes Intravenous Every 12 hours 08/25/14 1009 08/26/14 0949   08/25/14 1200  fluconazole (DIFLUCAN) tablet 400 mg  Status:  Discontinued     400 mg Oral Daily 08/25/14 0943 09/06/14 2117   08/23/14 1000  vancomycin (VANCOCIN) IVPB 750 mg/150 ml premix  Status:  Discontinued     750 mg 150 mL/hr over 60 Minutes Intravenous Every 12 hours 08/22/14 2127 08/25/14 1009   08/23/14 0600  piperacillin-tazobactam (ZOSYN) IVPB 3.375 g  Status:  Discontinued     3.375 g 12.5 mL/hr over 240 Minutes Intravenous 3 times per day 08/22/14 2124 09/05/14 1356   08/22/14 2130  piperacillin-tazobactam (ZOSYN) IVPB 3.375 g     3.375 g 100 mL/hr over 30 Minutes Intravenous NOW 08/22/14 2124 08/22/14 2220   08/22/14 2130  [MAR Hold]  vancomycin (VANCOCIN) 2,000 mg in sodium chloride 0.9 % 500 mL IVPB     (MAR Hold since 08/22/14 2338)   2,000 mg 250 mL/hr over 120 Minutes Intravenous NOW 08/22/14 2127 08/23/14 0008       Assessment/Plan    1. Ulcerative colitis vs Crohn's colitis 2. Perirectal abscess and fistula -plan is to take the patient to the OR tomorrow for a diagnostic laparoscopy with diverting loop ileostomy and possible EUA for rectal disease.  Dr. Barry Dienes has discussed this with the patient and her daughter. -NPO after midnight -agree with adding Zosyn -agree with giving blood, per primary service 3. DVT this admit with IVC filter 4. PCM/TNA  LOS: 21 days    [name] E 09/12/2014, 9:36 AM Pager: [phone]

## 2014-09-13 ENCOUNTER — Inpatient Hospital Stay (HOSPITAL_COMMUNITY): Payer: Commercial Managed Care - HMO | Admitting: Anesthesiology

## 2014-09-13 ENCOUNTER — Encounter (HOSPITAL_COMMUNITY)
Admission: EM | Disposition: A | Discharge status: Skilled Nursing Facility | Payer: Self-pay | Source: Home / Self Care | Attending: Internal Medicine

## 2014-09-13 ENCOUNTER — Encounter (HOSPITAL_COMMUNITY): Payer: Self-pay | Admitting: Certified Registered Nurse Anesthetist

## 2014-09-13 HISTORY — PX: RECTAL EXAM UNDER ANESTHESIA: SHX6399

## 2014-09-13 HISTORY — PX: LAPAROSCOPIC DIVERTED COLOSTOMY: SHX5892

## 2014-09-13 LAB — TYPE AND SCREEN
ABO/RH(D): A POS
ANTIBODY SCREEN: NEGATIVE
UNIT DIVISION: 0
Unit division: 0

## 2014-09-13 LAB — GLUCOSE, CAPILLARY
GLUCOSE-CAPILLARY: 123 mg/dL — AB (ref 65–99)
Glucose-Capillary: 137 mg/dL — ABNORMAL HIGH (ref 65–99)
Glucose-Capillary: 121 mg/dL — ABNORMAL HIGH (ref 65–99)
Glucose-Capillary: 151 mg/dL — ABNORMAL HIGH (ref 65–99)
Glucose-Capillary: 155 mg/dL — ABNORMAL HIGH (ref 65–99)
Glucose-Capillary: 124 mg/dL — ABNORMAL HIGH (ref 65–99)
Glucose-Capillary: 95 mg/dL (ref 65–99)

## 2014-09-13 LAB — COMPREHENSIVE METABOLIC PANEL
ALK PHOS: 87 U/L (ref 38–126)
ALT: 25 U/L (ref 14–54)
AST: 30 U/L (ref 15–41)
Albumin: 2 g/dL — ABNORMAL LOW (ref 3.5–5.0)
Anion gap: 7 (ref 5–15)
BILIRUBIN TOTAL: 0.5 mg/dL (ref 0.3–1.2)
BUN: 25 mg/dL — ABNORMAL HIGH (ref 6–20)
CO2: 30 mmol/L (ref 22–32)
Calcium: 8 mg/dL — ABNORMAL LOW (ref 8.9–10.3)
Chloride: 95 mmol/L — ABNORMAL LOW (ref 101–111)
Creatinine, Ser: 0.47 mg/dL (ref 0.44–1.00)
Glucose, Bld: 123 mg/dL — ABNORMAL HIGH (ref 65–99)
Potassium: 4.5 mmol/L (ref 3.5–5.1)
SODIUM: 132 mmol/L — AB (ref 135–145)
Total Protein: 5 g/dL — ABNORMAL LOW (ref 6.5–8.1)

## 2014-09-13 LAB — CBC
HEMATOCRIT: 27.3 % — AB (ref 36.0–46.0)
Hemoglobin: 8.8 g/dL — ABNORMAL LOW (ref 12.0–15.0)
MCH: 29.6 pg (ref 26.0–34.0)
MCHC: 32.2 g/dL (ref 30.0–36.0)
MCV: 91.9 fL (ref 78.0–100.0)
Platelets: 235 10*3/uL (ref 150–400)
RBC: 2.97 MIL/uL — AB (ref 3.87–5.11)
RDW: 18.3 % — ABNORMAL HIGH (ref 11.5–15.5)
WBC: 20.3 10*3/uL — ABNORMAL HIGH (ref 4.0–10.5)

## 2014-09-13 SURGERY — CREATION, COLOSTOMY, DIVERTING, LAPAROSCOPIC
Anesthesia: General | Site: Rectum

## 2014-09-13 MED ORDER — GLYCOPYRROLATE 0.2 MG/ML IJ SOLN
INTRAMUSCULAR | Status: AC
  Filled 2014-09-13: qty 3

## 2014-09-13 MED ORDER — FAT EMULSION 20 % IV EMUL
240.0000 mL | INTRAVENOUS | Status: AC
  Administered 2014-09-13: 240 mL via INTRAVENOUS
  Filled 2014-09-13: qty 250

## 2014-09-13 MED ORDER — TRACE MINERALS CR-CU-MN-SE-ZN 10-1000-500-60 MCG/ML IV SOLN
INTRAVENOUS | Status: AC
  Administered 2014-09-13: 18:00:00 via INTRAVENOUS
  Filled 2014-09-13: qty 1992

## 2014-09-13 MED ORDER — PROPOFOL 10 MG/ML IV BOLUS
INTRAVENOUS | Status: DC | PRN
  Administered 2014-09-13: 130 mg via INTRAVENOUS

## 2014-09-13 MED ORDER — GLYCOPYRROLATE 0.2 MG/ML IJ SOLN
INTRAMUSCULAR | Status: DC | PRN
  Administered 2014-09-13: .8 mg via INTRAVENOUS

## 2014-09-13 MED ORDER — FENTANYL CITRATE (PF) 100 MCG/2ML IJ SOLN
INTRAMUSCULAR | Status: DC | PRN
  Administered 2014-09-13 (×7): 50 ug via INTRAVENOUS

## 2014-09-13 MED ORDER — LIDOCAINE HCL 1 % IJ SOLN
INTRAMUSCULAR | Status: AC
  Filled 2014-09-13: qty 20

## 2014-09-13 MED ORDER — ONDANSETRON HCL 4 MG/2ML IJ SOLN
INTRAMUSCULAR | Status: AC
  Filled 2014-09-13: qty 2

## 2014-09-13 MED ORDER — NEOSTIGMINE METHYLSULFATE 10 MG/10ML IV SOLN
INTRAVENOUS | Status: DC | PRN
  Administered 2014-09-13: 5 mg via INTRAVENOUS

## 2014-09-13 MED ORDER — BUPIVACAINE-EPINEPHRINE (PF) 0.25% -1:200000 IJ SOLN
INTRAMUSCULAR | Status: AC
  Filled 2014-09-13: qty 30

## 2014-09-13 MED ORDER — LIDOCAINE HCL (CARDIAC) 20 MG/ML IV SOLN
INTRAVENOUS | Status: AC
  Filled 2014-09-13: qty 5

## 2014-09-13 MED ORDER — LACTATED RINGERS IV SOLN
INTRAVENOUS | Status: DC | PRN
  Administered 2014-09-13: 09:00:00 via INTRAVENOUS

## 2014-09-13 MED ORDER — NEOSTIGMINE METHYLSULFATE 10 MG/10ML IV SOLN
INTRAVENOUS | Status: AC
  Filled 2014-09-13: qty 1

## 2014-09-13 MED ORDER — LIDOCAINE HCL (CARDIAC) 20 MG/ML IV SOLN
INTRAVENOUS | Status: DC | PRN
  Administered 2014-09-13: 80 mg via INTRAVENOUS

## 2014-09-13 MED ORDER — PROPOFOL 10 MG/ML IV BOLUS
INTRAVENOUS | Status: AC
  Filled 2014-09-13: qty 20

## 2014-09-13 MED ORDER — FENTANYL CITRATE (PF) 250 MCG/5ML IJ SOLN
INTRAMUSCULAR | Status: AC
  Filled 2014-09-13: qty 5

## 2014-09-13 MED ORDER — MORPHINE SULFATE 10 MG/ML IJ SOLN
INTRAMUSCULAR | Status: DC
Start: 2014-09-13 — End: 2014-09-13
  Filled 2014-09-13: qty 1

## 2014-09-13 MED ORDER — SODIUM CHLORIDE 0.9 % IV SOLN
INTRAVENOUS | Status: AC
  Administered 2014-09-13: 15:00:00 via INTRAVENOUS

## 2014-09-13 MED ORDER — FENTANYL CITRATE (PF) 100 MCG/2ML IJ SOLN
INTRAMUSCULAR | Status: AC
  Filled 2014-09-13: qty 2

## 2014-09-13 MED ORDER — PHENYLEPHRINE HCL 10 MG/ML IJ SOLN
10.0000 mg | INTRAVENOUS | Status: DC | PRN
  Administered 2014-09-13: 40 ug/min via INTRAVENOUS

## 2014-09-13 MED ORDER — MORPHINE SULFATE 10 MG/ML IJ SOLN: 2.0000 mg | INTRAMUSCULAR | Status: DC | PRN

## 2014-09-13 MED ORDER — LIDOCAINE HCL 1 % IJ SOLN
INTRAMUSCULAR | Status: DC | PRN
  Administered 2014-09-13: 10 mL

## 2014-09-13 MED ORDER — ONDANSETRON HCL 4 MG/2ML IJ SOLN: 4.0000 mg | Freq: Once | INTRAMUSCULAR | Status: DC | PRN

## 2014-09-13 MED ORDER — ROCURONIUM BROMIDE 100 MG/10ML IV SOLN
INTRAVENOUS | Status: AC
  Filled 2014-09-13: qty 1

## 2014-09-13 MED ORDER — ROCURONIUM BROMIDE 100 MG/10ML IV SOLN
INTRAVENOUS | Status: DC | PRN
  Administered 2014-09-13: 10 mg via INTRAVENOUS
  Administered 2014-09-13: 50 mg via INTRAVENOUS
  Administered 2014-09-13: 10 mg via INTRAVENOUS

## 2014-09-13 MED ORDER — ONDANSETRON HCL 4 MG/2ML IJ SOLN
INTRAMUSCULAR | Status: DC | PRN
  Administered 2014-09-13: 4 mg via INTRAVENOUS

## 2014-09-13 MED ORDER — SODIUM CHLORIDE 0.9 % IV SOLN
INTRAVENOUS | Status: DC
  Administered 2014-09-13: 18:00:00 via INTRAVENOUS

## 2014-09-13 MED ORDER — BUPIVACAINE-EPINEPHRINE 0.25% -1:200000 IJ SOLN
INTRAMUSCULAR | Status: DC | PRN
  Administered 2014-09-13: 10 mL

## 2014-09-13 SURGICAL SUPPLY — 93 items
ADH SKN CLS APL DERMABOND .7 (GAUZE/BANDAGES/DRESSINGS) ×2
APPLIER CLIP 5 13 M/L LIGAMAX5 (MISCELLANEOUS)
APPLIER CLIP ROT 10 11.4 M/L (STAPLE)
APR CLP MED LRG 11.4X10 (STAPLE)
APR CLP MED LRG 5 ANG JAW (MISCELLANEOUS)
BLADE EXTENDED COATED 6.5IN (ELECTRODE) ×4 IMPLANT
BLADE HEX COATED 2.75 (ELECTRODE) ×6 IMPLANT
BLADE SURG 15 STRL LF DISP TIS (BLADE) ×2 IMPLANT
BLADE SURG 15 STRL SS (BLADE) ×4
BLADE SURG SZ11 CARB STEEL (BLADE) ×2 IMPLANT
BRIEF STRETCH FOR OB PAD LRG (UNDERPADS AND DIAPERS) ×4 IMPLANT
CABLE HIGH FREQUENCY MONO STRZ (ELECTRODE) ×4 IMPLANT
CATH KIT ON-Q SILVERSOAK 7.5 (CATHETERS) IMPLANT
CATH KIT ON-Q SILVERSOAK 7.5IN (CATHETERS) IMPLANT
CELLS DAT CNTRL 66122 CELL SVR (MISCELLANEOUS) IMPLANT
CLIP APPLIE 5 13 M/L LIGAMAX5 (MISCELLANEOUS) IMPLANT
CLIP APPLIE ROT 10 11.4 M/L (STAPLE) IMPLANT
COUNTER NEEDLE 20 DBL MAG RED (NEEDLE) ×4 IMPLANT
COVER SURGICAL LIGHT HANDLE (MISCELLANEOUS) ×4 IMPLANT
DECANTER SPIKE VIAL GLASS SM (MISCELLANEOUS) ×4 IMPLANT
DERMABOND ADVANCED (GAUZE/BANDAGES/DRESSINGS) ×2
DERMABOND ADVANCED .7 DNX12 (GAUZE/BANDAGES/DRESSINGS) IMPLANT
DRAIN CHANNEL 19F RND (DRAIN) IMPLANT
DRAPE LAPAROSCOPIC ABDOMINAL (DRAPES) ×4 IMPLANT
DRAPE UTILITY XL STRL (DRAPES) ×8 IMPLANT
DRSG OPSITE POSTOP 4X10 (GAUZE/BANDAGES/DRESSINGS) IMPLANT
DRSG OPSITE POSTOP 4X6 (GAUZE/BANDAGES/DRESSINGS) IMPLANT
DRSG OPSITE POSTOP 4X8 (GAUZE/BANDAGES/DRESSINGS) IMPLANT
DRSG PAD ABDOMINAL 8X10 ST (GAUZE/BANDAGES/DRESSINGS) IMPLANT
DRSG TEGADERM 4X4.75 (GAUZE/BANDAGES/DRESSINGS) IMPLANT
ELECT REM PT RETURN 9FT ADLT (ELECTROSURGICAL) ×4
ELECTRODE REM PT RTRN 9FT ADLT (ELECTROSURGICAL) ×2 IMPLANT
ENDOLOOP SUT PDS II  0 18 (SUTURE)
ENDOLOOP SUT PDS II 0 18 (SUTURE) IMPLANT
GAUZE SPONGE 4X4 12PLY STRL (GAUZE/BANDAGES/DRESSINGS) ×4 IMPLANT
GAUZE SPONGE 4X4 16PLY XRAY LF (GAUZE/BANDAGES/DRESSINGS) ×4 IMPLANT
GLOVE BIO SURGEON STRL SZ 6 (GLOVE) ×8 IMPLANT
GLOVE INDICATOR 6.5 STRL GRN (GLOVE) ×8 IMPLANT
GOWN STRL REUS W/ TWL XL LVL3 (GOWN DISPOSABLE) ×6 IMPLANT
GOWN STRL REUS W/TWL XL LVL3 (GOWN DISPOSABLE) ×12
KIT BASIN OR (CUSTOM PROCEDURE TRAY) ×4 IMPLANT
LEGGING LITHOTOMY PAIR STRL (DRAPES) ×4 IMPLANT
LUBRICANT JELLY K Y 4OZ (MISCELLANEOUS) ×4 IMPLANT
NDL HYPO 25X1 1.5 SAFETY (NEEDLE) ×2 IMPLANT
NEEDLE HYPO 25X1 1.5 SAFETY (NEEDLE) ×4 IMPLANT
PACK COLON (CUSTOM PROCEDURE TRAY) ×4 IMPLANT
PACK LITHOTOMY IV (CUSTOM PROCEDURE TRAY) ×2 IMPLANT
PENCIL BUTTON HOLSTER BLD 10FT (ELECTRODE) ×4 IMPLANT
RETRACTOR WND ALEXIS 18 MED (MISCELLANEOUS) IMPLANT
RTRCTR WOUND ALEXIS 18CM MED (MISCELLANEOUS)
SCISSORS LAP 5X35 DISP (ENDOMECHANICALS) ×4 IMPLANT
SEALER TISSUE G2 CVD JAW 35 (ENDOMECHANICALS) IMPLANT
SEALER TISSUE G2 CVD JAW 45CM (ENDOMECHANICALS)
SEALER TISSUE G2 STRG ARTC 35C (ENDOMECHANICALS) IMPLANT
SET IRRIG TUBING LAPAROSCOPIC (IRRIGATION / IRRIGATOR) ×4 IMPLANT
SHEARS HARMONIC ACE PLUS 36CM (ENDOMECHANICALS) IMPLANT
SLEEVE SURGEON STRL (DRAPES) IMPLANT
SLEEVE XCEL OPT CAN 5 100 (ENDOMECHANICALS) ×8 IMPLANT
SOLUTION ANTI FOG 6CC (MISCELLANEOUS) ×4 IMPLANT
SPONGE SURGIFOAM ABS GEL 100 (HEMOSTASIS) IMPLANT
STAPLER VISISTAT 35W (STAPLE) ×4 IMPLANT
SUT CHROMIC 2 0 SH (SUTURE) IMPLANT
SUT CHROMIC 3 0 SH 27 (SUTURE) IMPLANT
SUT MNCRL AB 4-0 PS2 18 (SUTURE) ×4 IMPLANT
SUT PDS AB 1 CTX 36 (SUTURE) IMPLANT
SUT PDS AB 1 TP1 96 (SUTURE) IMPLANT
SUT PROLENE 2 0 KS (SUTURE) IMPLANT
SUT SILK 2 0 (SUTURE)
SUT SILK 2 0 SH CR/8 (SUTURE) IMPLANT
SUT SILK 2-0 18XBRD TIE 12 (SUTURE) IMPLANT
SUT SILK 3 0 (SUTURE)
SUT SILK 3 0 SH CR/8 (SUTURE) IMPLANT
SUT SILK 3-0 18XBRD TIE 12 (SUTURE) IMPLANT
SUT VIC AB 2-0 SH 18 (SUTURE) ×2 IMPLANT
SUT VIC AB 3-0 SH 18 (SUTURE) ×2 IMPLANT
SUT VICRYL 2 0 18  UND BR (SUTURE)
SUT VICRYL 2 0 18 UND BR (SUTURE) ×2 IMPLANT
SUT VICRYL 3 0 BR 18  UND (SUTURE) ×4
SUT VICRYL 3 0 BR 18 UND (SUTURE) ×2 IMPLANT
SYR 20CC LL (SYRINGE) ×4 IMPLANT
SYS LAPSCP GELPORT 120MM (MISCELLANEOUS)
SYSTEM LAPSCP GELPORT 120MM (MISCELLANEOUS) IMPLANT
TOWEL OR 17X26 10 PK STRL BLUE (TOWEL DISPOSABLE) ×4 IMPLANT
TOWEL OR NON WOVEN STRL DISP B (DISPOSABLE) ×8 IMPLANT
TRAY FOLEY W/METER SILVER 14FR (SET/KITS/TRAYS/PACK) ×4 IMPLANT
TROCAR BLADELESS OPT 5 100 (ENDOMECHANICALS) ×4 IMPLANT
TROCAR XCEL BLUNT TIP 100MML (ENDOMECHANICALS) IMPLANT
TROCAR XCEL NON-BLD 11X100MML (ENDOMECHANICALS) ×2 IMPLANT
TUBING CONNECTING 10 (TUBING) IMPLANT
TUBING CONNECTING 10' (TUBING)
TUBING FILTER THERMOFLATOR (ELECTROSURGICAL) ×4 IMPLANT
TUNNELER SHEATH ON-Q 16GX12 DP (PAIN MANAGEMENT) IMPLANT
YANKAUER SUCT BULB TIP 10FT TU (MISCELLANEOUS) ×4 IMPLANT

## 2014-09-13 NOTE — Anesthesia Procedure Notes (Signed)
Procedure Name: Intubation Date/Time: 09/13/2014 9:27 AM Performed by: Montel Clock Pre-anesthesia Checklist: Patient identified, Emergency Drugs available, Suction available, Patient being monitored and Timeout performed Patient Re-evaluated:Patient Re-evaluated prior to inductionOxygen Delivery Method: Circle system utilized Preoxygenation: Pre-oxygenation with 100% oxygen Intubation Type: IV induction Ventilation: Mask ventilation without difficulty and Oral airway inserted - appropriate to patient size Laryngoscope Size: Mac and 3 Grade View: Grade I Tube type: Oral Tube size: 7.5 mm Number of attempts: 1 Airway Equipment and Method: Stylet Placement Confirmation: ETT inserted through vocal cords under direct vision,  positive ETCO2 and breath sounds checked- equal and bilateral Secured at: 21 cm Tube secured with: Tape Dental Injury: Teeth and Oropharynx as per pre-operative assessment

## 2014-09-13 NOTE — Progress Notes (Signed)
[facility] PROGRESS NOTE  [name] LZJ:[id number] DOB: 1940-04-06 DOA: 08/22/2014 PCP: Kandice Hams, MD  BRIEF NARRATIVE 74 y.o. female with past medical history of dyslipidemia, hypertension, long standing ulcerative colitis and recently hospitalized from 08/07/2014 through 08/21/2014 for ulcerative colitis flare. She had flexible sigmoidoscopy 08/08/2014 which demonstrated inflammatory changes from the rectum consistent with ulcerative colitis. She presented to [facility] long hospital with perianal pain started the night prior to the admission. CT scan on the admission demonstrated a complex perirectal infection extending up toward the retroperitoneal area.  On 08/21/13 , patient underwent incision and drainage of the large issue rectal abscess on the right side. She was started on vancomycin and Zosyn. Postprocedure she was doing fine but has developed large amount of bleed on the morning of 08/23/2014. GI was consulted and pt was started on prednisone and mesalamine.  Hospital course has been complicated with ongoing rectal bleed so Additionally, she was found to have left LE DVT On venous doppler done 7/12.   On 7/20, pt underwent flex sigmoidoscopy with findings of significant Lt sided disease with GI recs for continued bowel rest, PO flagyl, increased steroids, and Remicade. After several days of bowel rest, pt's WBC rose and pt's steroids were tapered for infection to subside. Patient taken to or by surgery for laproscopic diverting ileostomy on 7/27.   Assessment/Plan: Sepsis secondary to perirectal abscess Incision and drainage by surgery on 08/23/2014. Blood cultures 7 negative. Abscess cultures showed rare yeast and multiple organisms.  -Recommended by ID for a total 14 day duration of antibiotic (was continued on fluconazole and Flagyl) with stop date on 7/18 however due to stool in abscess cavity and possible fistula antibiotic has been prolonged. Zosyn added due to worsening  WBC and encephalopathy. Flagyl was discontinued. -Patient underwent diverting ileostomy today (7/27). 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  Rectal bleed secondary to ulcerative colitis with acute blood loss anemia -Patient started on prednisone and mesalamine which was unable to control rectal bleed and thus started on Remicade. Steroid  being tapered due to concern for persistent abscess. -Received a total of 3 u PRBC since admission. (Hemoglobin 12 on admission which has dropped to 7-8.    Bilateral lower extremity DVT Not a candidate for anticoagulation due to ongoing rectal bleed and uncontrolled ulcerative colitis. IVC filter placed during this hospitalization.  Essential hypertension Currently nothing by mouth. Monitor on hydralazine when necessary.  Hypokalemia/hyponatremia Likely secondary to GI loss. Continue IV fluids. Lites adjusted with TPN.  Generalized weakness Eventually needs skilled nursing facility.   DVT prophylaxis: SCDs  Diet: TPN  Code Status: Full code Family Communication: None at bedside Disposition Plan: Continue inpatient monitoring for now. PT evaluation and possible skilled nursing facility.   Consultants:  [facility]  GI    Procedures:  Flexible sigmoidoscopy on 7/20  Doppler lower extremity  IVC filter  PICC line  laproscopic diverting ileostomy on 7/27  Antibiotics:  Fluconazole 7/18--  Zosyn 7/25  Flagyl 7/4-7/25  HPI/Subjective: Patient seen and examined after returning from OR  Objective: Filed Vitals:   09/13/14 1446  BP: 131/61  Pulse: 73  Temp: 98.1 F (36.7 C)  Resp: 18    Intake/Output Summary (Last 24 hours) at 09/13/14 1701 Last data filed at 09/13/14 1146  Gross per 24 hour  Intake   2956 ml  Output  2980 ml  Net    -24 ml   Filed Weights   08/23/14 0153 08/23/14 0434  09/10/14 7544  Weight: 102.3 kg (225 lb 8.5 oz) 104 kg (229 lb 4.5 oz) 107.3 kg (236 lb 8.9 oz)    Exam:   General:  NAD, fatigued  HEENT: pallor+, moist mucosa, supple neck   chest: clear b/l   CVS: NS1&S2, no murmurs  GI: soft, ileostomy bag with some stool, BS+, tender+  Musculoskeletal: warm, no edema  Data Reviewed: Basic Metabolic Panel:  Recent Labs Lab 09/07/14 0438  09/09/14 0500 09/10/14 0822 09/11/14 0333 09/12/14 0820 09/13/14 0420  NA 133*  < > 131* 128* 128* 126* 132*  K 4.3  < > 4.2 4.4 4.5 4.7 4.5  CL 97*  < > 93* 93* 96* 94* 95*  CO2 31  < > 31 29 29 29 30   GLUCOSE 155*  < > 176* 183* 182* 153* 123*  BUN 10  < > 18 24* 30* 29* 25*  CREATININE 0.46  < > 0.45 0.45 0.43* 0.46 0.47  CALCIUM 7.7*  < > 8.0* 8.0* 7.6* 7.4* 8.0*  MG 2.5*  --   --   --  1.7  --   --   PHOS 3.2  --   --   --  3.4  --   --   < > = values in this interval not displayed. Liver Function Tests:  Recent Labs Lab 09/07/14 0438 09/11/14 0333 09/13/14 0420  AST 29 28 30   ALT 19 27 25   ALKPHOS 108 82 87  BILITOT 0.3 0.5 0.5  PROT 5.1* 4.8* 5.0*  ALBUMIN 1.8* 1.9* 2.0*   No results for input(s): LIPASE, AMYLASE in the last 168 hours. No results for input(s): AMMONIA in the last 168 hours. CBC:  Recent Labs Lab 09/07/14 0438  09/09/14 0500 09/10/14 0730 09/11/14 0333 09/11/14 0605 09/12/14 0820 09/13/14 0420  WBC 11.8*  < > 20.6* 29.9* 25.6*  --  23.7* 20.3*  NEUTROABS 10.6*  --   --   --  22.0*  --   --   --   HGB 8.2*  < > 7.7* 7.5* 7.4* 7.7* 7.4* 8.8*  HCT 26.1*  < > 24.0* 23.8* 22.3* 24.1* 22.5* 27.3*  MCV 94.9  < > 96.0 96.4 91.8  --  94.1 91.9  PLT 362  < > 378 384 293  --  240 235  < > = values in this interval not displayed. Cardiac Enzymes: No results for input(s): CKTOTAL, CKMB, CKMBINDEX, TROPONINI in the last 168 hours. BNP (last 3 results) No results for input(s): BNP in the last 8760 hours.  ProBNP (last 3 results) No results for input(s):  PROBNP in the last 8760 hours.  CBG:  Recent Labs Lab 09/13/14 0353 09/13/14 0846 09/13/14 1032 09/13/14 1119 09/13/14 1621  GLUCAP 137* 121* 155* 151* 124*    No results found for this or any previous visit (from the past 240 hour(s)).   Studies: No results found.  Scheduled Meds: . sodium chloride   Intravenous Once  . amLODipine  10 mg Oral Daily  . fluconazole (DIFLUCAN) IV  400 mg Intravenous Q24H  . insulin aspart  0-20 Units Subcutaneous Q4H  . lip balm  1 application Topical BID  . mesalamine  4.8 g Oral Q breakfast  . methylPREDNISolone (SOLU-MEDROL) injection  20 mg Intravenous Q24H  . metoprolol  2.5 mg Intravenous 4 times per day  . nystatin  Topical BID  . piperacillin-tazobactam (ZOSYN)  IV  3.375 g Intravenous 3 times per day  . saccharomyces boulardii  250 mg Oral BID  . sodium chloride  10-40 mL Intracatheter Q12H  . sodium chloride  3 mL Intravenous Q12H  . sodium chloride  3 mL Intravenous Q12H   Continuous Infusions: . Marland KitchenTPN (CLINIMIX-E) Adult 83 mL/hr at 09/12/14 1802   And  . fat emulsion 240 mL (09/12/14 1803)  . Marland KitchenTPN (CLINIMIX-E) Adult     And  . fat emulsion    . sodium chloride 50 mL/hr at 09/13/14 1430  . sodium chloride        Time spent: [place], [place]  [facility] Pager [phone] If 7PM-7AM, please contact night-coverage at [email or web address], password [facility] 09/13/2014, 5:01 PM  LOS: 22 days

## 2014-09-13 NOTE — Anesthesia Postprocedure Evaluation (Signed)
  Anesthesia Post-op Note  Patient: [name]  Procedure(s) Performed: Procedure(s) (LRB): LAPAROSCOPIC DIVERTED ILEOSTOMY (N/A) RECTAL EXAM UNDER ANESTHESIA  AND DEBRIDEMENT OF PERIANAL WOUND (N/A)  Patient Location: PACU  Anesthesia Type: General  Level of Consciousness: awake and alert   Airway and Oxygen Therapy: Patient Spontanous Breathing  Post-op Pain: mild  Post-op Assessment: Post-op Vital signs reviewed, Patient's Cardiovascular Status Stable, Respiratory Function Stable, Patent Airway and No signs of Nausea or vomiting  Last Vitals:  Filed Vitals:   09/13/14 1145  BP: 143/70  Pulse: 78  Temp: 36.6 C  Resp: 12    Post-op Vital Signs: stable   Complications: No apparent anesthesia complications

## 2014-09-13 NOTE — Op Note (Signed)
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  FINDINGS:  No evidence of intraabdominal adhesions.  Normal terminal ileum and cecum.  Rectal fistula around 1 cm in diameter approximately 3 cm from anal verge.  Deep abscess cavity, around 10 cm to right of rectum.    EBL: min  PROCEDURE:  Patient was identified in the holding area and taken to the operating room where she was placed supine on the operating room table. General endotracheal anesthesia was induced. The patient was then placed into low lithotomy position with the left arm tucked. Her abdomen and perineum were prepped and draped in standard fashion. A timeout was performed according to the surgical safety checklist.  When all was correct, we continued.  Patient was placed into reverse Trendelenburg position and rotated to the right. A 5 mm Optiview trocar was used to access the abdomen under direct visualization. Pneumoperitoneum was achieved to a pressure of 15 mmHg. 2 additional 5 mm trochars were placed in the left abdomen. The patient was taken out of reverse Trendelenburg position. The patient was then rotated to the left and the cecum was identified easily. The cecum and terminal ileum appear normal. The ileum was traced approximately 15-20 cm from the ileocecal valve. A additional trocar was placed in the right-sided ileostomy site. The tissue just adjacent to the ileum was grasped and elevated.   A ostomy site  was opened at the trocar. The fascia was opened in cruciate fashion. The trocar and bowel were then pulled out of the abdomen.  A babcock clamp was used to keep the bowel elevated at the skin site. The pneumoperitoneum was allowed to evacuate. The skin of the 5 mm trocar sites was closed with 4-0 Monocryl. The skin was dressed with dermabond.    The ostomy was then matured with 3-0 Vicryl. The ostomy bag was then applied.  The perineum was then examined.  The abscess cavity was irrigated.  The rectum was examined.  There were two small areas of skin that were flapping over the abscess cavity.  These were debrided.    The wound was packed with betadine soaked kerlix.  The perineum was dressed with 4x4s, ABDs and mesh underwear.  The patient was allowed to emerge from anesthesia and taken to the PACU in stable condition.

## 2014-09-13 NOTE — Progress Notes (Signed)
Date:  September 13, 2014 U.R. performed for needs and level of care. Patient having total colectomy today [id number]. Will continue to follow for Case Management needs.  Velva Harman, RN, BSN, [place]   [phone]

## 2014-09-13 NOTE — Progress Notes (Signed)
TPN and Lipids stopped at 0845, Pt to OR for Procedure. Pharm and MD aware. Will continue to monitor.

## 2014-09-13 NOTE — Progress Notes (Addendum)
[place] NOTE  Pharmacy Consult for TPN Indication: severe IBD with fistula and abscess  Allergies  Allergen Reactions  . Clindamycin/Lincomycin Diarrhea    Leads to colitis flare  . Ivp Dye [Iodinated Diagnostic Agents] Other (See Comments)    "almost passed out"    Patient Measurements: Height: 5\' 9"  (175.3 cm) Weight: 236 lb 8.9 oz (107.3 kg) IBW/kg (Calculated) : 66.2 Adjusted Body Weight: 81.3kg  Vital Signs: Temp: 98.1 F (36.7 C) (07/27 1446) Temp Source: Oral (07/27 1446) BP: 131/61 mmHg (07/27 1446) Pulse Rate: 73 (07/27 1446) Intake/Output from previous day: 07/26 0701 - 07/27 0700 In: 1941 [I.V.:490; TPN:1116] Out: 2700 [Urine:2700] Intake/Output from this shift: Total I/O In: 1600 [I.V.:1600] Out: 280 [Urine:280]  Labs:  Recent Labs  09/11/14 0333 09/11/14 0605 09/12/14 0820 09/13/14 0420  WBC 25.6*  --  23.7* 20.3*  HGB 7.4* 7.7* 7.4* 8.8*  HCT 22.3* 24.1* 22.5* 27.3*  PLT 293  --  240 235     Recent Labs  09/11/14 0333 09/11/14 0343 09/12/14 0820 09/13/14 0420  NA 128*  --  126* 132*  K 4.5  --  4.7 4.5  CL 96*  --  94* 95*  CO2 29  --  29 30  GLUCOSE 182*  --  153* 123*  BUN 30*  --  29* 25*  CREATININE 0.43*  --  0.46 0.47  CALCIUM 7.6*  --  7.4* 8.0*  MG 1.7  --   --   --   PHOS 3.4  --   --   --   PROT 4.8*  --   --  5.0*  ALBUMIN 1.9*  --   --  2.0*  AST 28  --   --  30  ALT 27  --   --  25  ALKPHOS 82  --   --  87  BILITOT 0.5  --   --  0.5  PREALBUMIN  --  26.9  --   --   TRIG 109  --   --   --    Estimated Creatinine Clearance: 78 mL/min (by C-G formula based on Cr of 0.47).    Recent Labs  09/13/14 0846 09/13/14 1032 09/13/14 1119  GLUCAP 121* 155* 151*     Medications: Continuous Infusions: . Marland KitchenTPN (CLINIMIX-E) Adult 83 mL/hr at 09/12/14 1802   And  . fat emulsion 240 mL (09/12/14 1803)  . sodium chloride 50 mL/hr at 09/13/14 1430  . sodium chloride     Insulin Requirements:   Resistant SSI: 20 units / 24 hrs Insulin in TPN: 40 units/ 24 hrs (received ~ 25 units since TPN stopped this AM prior to OR)  Current Nutrition:  Diet: NPO TPN @ 83 ml/hr, Lipids @ 10 ml/hr (held this AM prior to patient going to OR) IVF: NS @ KVO, returned from OR at 50 mL/hr   Central access: PICC placed 08/30/14 TPN start date:  7/20  ASSESSMENT  HPI:  74 y.o. female with past medical history of dyslipidemia, hypertension, long standing ulcerative colitis and recently hospitalized from 08/07/2014 through 08/21/2014 for ulcerative colitis flare.  She presented to [facility] 7/5 for readmission d/t perianal pain.  S/p I&D on 08/21/13 of the large issue rectal abscess on the right side.  Further surgery (colectomy vs colostomy) postponed due to severe colitis.  Pharmacy consulted to start TPN for severe IBD with fistula and abscess.  Significant events:  7/5 I&D of complex perirectal abscess 7/20 flex sig: severe left sided colitis consistent with Crohn's colitis; flexi-seal removed.  TPN started. 7/22: TPN titrated to goal rate 7/27: s/p laparoscopic diverting ileostomy today  Today:   Glucose (goal CBGs <150):  no history of diabetes, remains on Solumedrol, weaned to once daily; CBGs improving (range 121-155). Insulin added to TPN on 7/22, increased 7/23, 7/24, 7/25, and 7/26. Also covered with SSI.  Electrolytes:  Na, Cl remain low but improving after addition of NaCl to TPN 7/26. All others WNL including Corrected Ca.  Renal:  SCr WNL and stable, CrCl 78 ml/min  LFTs:  WNL  TGs: 128 (7/21), 109 (7/25) - stable  Prealbumin: 19.6 (7/21), 26.9 (7/25) - rising, possibly skewed by steroids  NUTRITIONAL GOALS                                                                                             RD recs (7/26): 1900-2100 Kcal/day, 100-110 g protein / day Clinimix E  5/15 at a goal rate of 83 ml/hr + 20% fat emulsion at 77ml/hr to provide: 100 g/day protein, 1894 Kcal/day.   PLAN         Will continue NS at 50 mL/hr from OR until new TPN hung tonight per d/w TRH.                                                                                                                   At 1800 today:  Resume Clinimix E 5/15 at goal rate of 83 ml/hr.  Add regular insulin, increase to 45 units/24 hours.  Add 42 mEq/L NaCl to make TPN concentration 0.45%NS.  TPN to contain standard multivitamins and trace elements.  Continue 20% fat emulsion at 10 ml/hr.  Decrease NS back to KVO rate.  Continue CBGs and resistant SSI q4h.  TPN lab panels on Mondays & Thursdays.  F/u daily.   Lindell Spar, PharmD, BCPS Pager: [phone] 09/13/2014 4:01 PM

## 2014-09-13 NOTE — Transfer of Care (Signed)
Immediate Anesthesia Transfer of Care Note  Patient: [name]  Procedure(s) Performed: Procedure(s): LAPAROSCOPIC DIVERTED ILEOSTOMY (N/A) RECTAL EXAM UNDER ANESTHESIA  AND DEBRIDEMENT OF PERIANAL WOUND (N/A)  Patient Location: PACU  Anesthesia Type:General  Level of Consciousness: awake, oriented and patient cooperative  Airway & Oxygen Therapy: Patient Spontanous Breathing and Patient connected to face mask oxygen  Post-op Assessment: Report given to RN and Post -op Vital signs reviewed and stable  Post vital signs: Reviewed and stable  Last Vitals:  Filed Vitals:   09/13/14 0522  BP: 134/66  Pulse: 79  Temp: 37.1 C  Resp: 16    Complications: No apparent anesthesia complications

## 2014-09-13 NOTE — H&P (View-Only) (Signed)
Patient ID: [name], female   DOB: 1940/03/15, 74 y.o.   MRN: [id number] 6 Days Post-Op  Subjective: [name] seems ok today.  Doesn't complain of anything specific.  Says she's not eating much.    Objective: Vital signs in last 24 hours: Temp:  [98.2 F (36.8 C)-98.3 F (36.8 C)] 98.3 F (36.8 C) (07/26 0419) Pulse Rate:  [78-81] 81 (07/26 0419) Resp:  [18] 18 (07/26 0419) BP: (120-137)/(64-76) 127/69 mmHg (07/26 0419) SpO2:  [96 %-97 %] 97 % (07/26 0419) Last BM Date: 09/10/14  Intake/Output from previous day: 07/25 0701 - 07/26 0700 In: -  Out: 5329 [Urine:1825] Intake/Output this shift:    PE: Abd: soft, NT, ND, +BS Rectal: tried to eval her rectal disease, but she had unknowingly stooled on herself.  Unable to see her rectal disease.  Her stool is liquid and mixed with blood  Lab Results:   Recent Labs  09/11/14 0333 09/11/14 0605 09/12/14 0820  WBC 25.6*  --  23.7*  HGB 7.4* 7.7* 7.4*  HCT 22.3* 24.1* 22.5*  PLT 293  --  240   BMET  Recent Labs  09/11/14 0333 09/12/14 0820  NA 128* 126*  K 4.5 4.7  CL 96* 94*  CO2 29 29  GLUCOSE 182* 153*  BUN 30* 29*  CREATININE 0.43* 0.46  CALCIUM 7.6* 7.4*   [name]/INR No results for input(s): LABPROT, INR in the last 72 hours. CMP     Component Value Date/Time   NA 126* 09/12/2014 0820   K 4.7 09/12/2014 0820   CL 94* 09/12/2014 0820   CO2 29 09/12/2014 0820   GLUCOSE 153* 09/12/2014 0820   BUN 29* 09/12/2014 0820   CREATININE 0.46 09/12/2014 0820   CREATININE 1.08 03/28/2014 1022   CALCIUM 7.4* 09/12/2014 0820   PROT 4.8* 09/11/2014 0333   ALBUMIN 1.9* 09/11/2014 0333   AST 28 09/11/2014 0333   ALT 27 09/11/2014 0333   ALKPHOS 82 09/11/2014 0333   BILITOT 0.5 09/11/2014 0333   GFRNONAA >60 09/12/2014 0820   GFRNONAA 50* 03/28/2014 1022   GFRAA >60 09/12/2014 0820   GFRAA 58* 03/28/2014 1022   Lipase     Component Value Date/Time   LIPASE 13* 08/07/2014 1201       Studies/Results: No results  found.  Anti-infectives: Anti-infectives    Start     Dose/Rate Route Frequency Ordered Stop   09/11/14 1600  piperacillin-tazobactam (ZOSYN) IVPB 3.375 g     3.375 g 12.5 mL/hr over 240 Minutes Intravenous 3 times per day 09/11/14 1523     09/07/14 1200  fluconazole (DIFLUCAN) IVPB 400 mg     400 mg 100 mL/hr over 120 Minutes Intravenous Every 24 hours 09/06/14 2117     09/06/14 2200  metroNIDAZOLE (FLAGYL) IVPB 250 mg     250 mg 50 mL/hr over 60 Minutes Intravenous Every 8 hours 09/06/14 2117     09/05/14 1500  metroNIDAZOLE (FLAGYL) tablet 250 mg  Status:  Discontinued     250 mg Oral 3 times per day 09/05/14 1440 09/06/14 2117   08/28/14 0000  fluconazole (DIFLUCAN) 200 MG tablet     400 mg Oral Daily 08/28/14 1052     08/28/14 0000  amoxicillin-clavulanate (AUGMENTIN) 875-125 MG per tablet     1 tablet Oral 2 times daily 08/28/14 1052     08/25/14 2000  vancomycin (VANCOCIN) IVPB 1000 mg/200 mL premix  Status:  Discontinued     1,000 mg 200 mL/hr  over 60 Minutes Intravenous Every 12 hours 08/25/14 1009 08/26/14 0949   08/25/14 1200  fluconazole (DIFLUCAN) tablet 400 mg  Status:  Discontinued     400 mg Oral Daily 08/25/14 0943 09/06/14 2117   08/23/14 1000  vancomycin (VANCOCIN) IVPB 750 mg/150 ml premix  Status:  Discontinued     750 mg 150 mL/hr over 60 Minutes Intravenous Every 12 hours 08/22/14 2127 08/25/14 1009   08/23/14 0600  piperacillin-tazobactam (ZOSYN) IVPB 3.375 g  Status:  Discontinued     3.375 g 12.5 mL/hr over 240 Minutes Intravenous 3 times per day 08/22/14 2124 09/05/14 1356   08/22/14 2130  piperacillin-tazobactam (ZOSYN) IVPB 3.375 g     3.375 g 100 mL/hr over 30 Minutes Intravenous NOW 08/22/14 2124 08/22/14 2220   08/22/14 2130  [MAR Hold]  vancomycin (VANCOCIN) 2,000 mg in sodium chloride 0.9 % 500 mL IVPB     (MAR Hold since 08/22/14 2338)   2,000 mg 250 mL/hr over 120 Minutes Intravenous NOW 08/22/14 2127 08/23/14 0008       Assessment/Plan    1. Ulcerative colitis vs Crohn's colitis 2. Perirectal abscess and fistula -plan is to take the patient to the OR tomorrow for a diagnostic laparoscopy with diverting loop ileostomy and possible EUA for rectal disease.  Dr. Barry Dienes has discussed this with the patient and her daughter. -NPO after midnight -agree with adding Zosyn -agree with giving blood, per primary service 3. DVT this admit with IVC filter 4. PCM/TNA  LOS: 21 days    Ayron Fillinger E 09/12/2014, 9:36 AM Pager: [phone]

## 2014-09-13 NOTE — Interval H&P Note (Signed)
History and Physical Interval Note:  09/13/2014 8:50 AM  [name]  has presented today for surgery, with the diagnosis of [place]  The various methods of treatment have been discussed with the patient and family. After consideration of risks, benefits and other options for treatment, the patient has consented to  Procedure(s): LAPAROSCOPIC DIVERTED ILEOSTOMY (N/A) RECTAL EXAM UNDER ANESTHESIA  AND DEBRIDEMENT OF PERIANAL WOUND (N/A) as a surgical intervention .  The patient's history has been reviewed, patient examined, no change in status, stable for surgery.  I have reviewed the patient's chart and labs.  Questions were answered to the patient's satisfaction.     Amarionna Arca

## 2014-09-14 ENCOUNTER — Encounter (HOSPITAL_COMMUNITY): Payer: Self-pay | Admitting: General Surgery

## 2014-09-14 LAB — MAGNESIUM: Magnesium: 1.7 mg/dL (ref 1.7–2.4)

## 2014-09-14 LAB — CBC
HCT: 26.4 % — ABNORMAL LOW (ref 36.0–46.0)
HEMOGLOBIN: 8.7 g/dL — AB (ref 12.0–15.0)
MCH: 31.3 pg (ref 26.0–34.0)
MCHC: 33 g/dL (ref 30.0–36.0)
MCV: 95 fL (ref 78.0–100.0)
PLATELETS: 311 10*3/uL (ref 150–400)
RBC: 2.78 MIL/uL — ABNORMAL LOW (ref 3.87–5.11)
RDW: 19 % — ABNORMAL HIGH (ref 11.5–15.5)
WBC: 18.2 10*3/uL — AB (ref 4.0–10.5)

## 2014-09-14 LAB — COMPREHENSIVE METABOLIC PANEL
ALT: 23 U/L (ref 14–54)
AST: 35 U/L (ref 15–41)
Albumin: 1.9 g/dL — ABNORMAL LOW (ref 3.5–5.0)
Alkaline Phosphatase: 86 U/L (ref 38–126)
Anion gap: 3 — ABNORMAL LOW (ref 5–15)
BUN: 23 mg/dL — ABNORMAL HIGH (ref 6–20)
CHLORIDE: 97 mmol/L — AB (ref 101–111)
CO2: 30 mmol/L (ref 22–32)
CREATININE: 0.55 mg/dL (ref 0.44–1.00)
Calcium: 7.8 mg/dL — ABNORMAL LOW (ref 8.9–10.3)
GFR calc non Af Amer: >60 mL/min (ref >60)
Glucose, Bld: 98 mg/dL (ref 65–99)
POTASSIUM: 4.8 mmol/L (ref 3.5–5.1)
Sodium: 130 mmol/L — ABNORMAL LOW (ref 135–145)
Total Bilirubin: 0.7 mg/dL (ref 0.3–1.2)
Total Protein: 4.8 g/dL — ABNORMAL LOW (ref 6.5–8.1)

## 2014-09-14 LAB — GLUCOSE, CAPILLARY
GLUCOSE-CAPILLARY: 111 mg/dL — AB (ref 65–99)
GLUCOSE-CAPILLARY: 114 mg/dL — AB (ref 65–99)
GLUCOSE-CAPILLARY: 141 mg/dL — AB (ref 65–99)
GLUCOSE-CAPILLARY: 167 mg/dL — AB (ref 65–99)
GLUCOSE-CAPILLARY: 169 mg/dL — AB (ref 65–99)
Glucose-Capillary: 107 mg/dL — ABNORMAL HIGH (ref 65–99)

## 2014-09-14 LAB — PHOSPHORUS: Phosphorus: 4 mg/dL (ref 2.5–4.6)

## 2014-09-14 MED ORDER — FAT EMULSION 20 % IV EMUL
240.0000 mL | INTRAVENOUS | Status: AC
  Administered 2014-09-14: 240 mL via INTRAVENOUS
  Filled 2014-09-14: qty 250

## 2014-09-14 MED ORDER — TRACE MINERALS CR-CU-MN-SE-ZN 10-1000-500-60 MCG/ML IV SOLN
INTRAVENOUS | Status: AC
  Administered 2014-09-14: 18:00:00 via INTRAVENOUS
  Filled 2014-09-14: qty 1992

## 2014-09-14 NOTE — Progress Notes (Signed)
[place] NOTE  Pharmacy Consult for TPN Indication: severe IBD with fistula and abscess  Allergies  Allergen Reactions  . Clindamycin/Lincomycin Diarrhea    Leads to colitis flare  . Ivp Dye [Iodinated Diagnostic Agents] Other (See Comments)    "almost passed out"    Patient Measurements: Height: 5\' 9"  (175.3 cm) Weight:  (RN is notified about the weight stituation for pt.) IBW/kg (Calculated) : 66.2 Adjusted Body Weight: 81.3kg  Vital Signs: Temp: 98.6 F (37 C) (07/28 0620) Temp Source: Oral (07/28 0620) BP: 114/49 mmHg (07/28 0620) Pulse Rate: 72 (07/28 0620) Intake/Output from previous day: 07/27 0701 - 07/28 0700 In: 4117.9 [I.V.:1610; TPN:2507.9] Out: 3055 [Urine:2980; Stool:75] Intake/Output from this shift:    Labs:  Recent Labs  09/12/14 0820 09/13/14 0420 09/14/14 0455  WBC 23.7* 20.3* 18.2*  HGB 7.4* 8.8* 8.7*  HCT 22.5* 27.3* 26.4*  PLT 240 235 311     Recent Labs  09/12/14 0820 09/13/14 0420 09/14/14 0720  NA 126* 132* 130*  K 4.7 4.5 4.8  CL 94* 95* 97*  CO2 29 30 30   GLUCOSE 153* 123* 98  BUN 29* 25* 23*  CREATININE 0.46 0.47 0.55  CALCIUM 7.4* 8.0* 7.8*  MG  --   --  1.7  PHOS  --   --  4.0  PROT  --  5.0* 4.8*  ALBUMIN  --  2.0* 1.9*  AST  --  30 35  ALT  --  25 23  ALKPHOS  --  87 86  BILITOT  --  0.5 0.7   Estimated Creatinine Clearance: 78 mL/min (by C-G formula based on Cr of 0.55).    Recent Labs  09/14/14 0009 09/14/14 0433 09/14/14 0807  GLUCAP 114* 111* 107*     Medications: Continuous Infusions: . Marland KitchenTPN (CLINIMIX-E) Adult 83 mL/hr at 09/13/14 1747   And  . fat emulsion 240 mL (09/13/14 1747)  . sodium chloride 10 mL/hr at 09/13/14 1759   Insulin Requirements:  Resistant SSI: 7 units / 24 hrs (was in surgery yesterday) Insulin in TPN: 45 units/ 24 hrs   Current Nutrition:  Diet: NPO TPN @ 83 ml/hr, Lipids @ 10 ml/hr  IVF: NS @ KVO   Central access: PICC placed 08/30/14 TPN start  date:  7/20  ASSESSMENT                                                                                                          HPI:  73 y.o. female with past medical history of dyslipidemia, hypertension, long standing ulcerative colitis and recently hospitalized from 08/07/2014 through 08/21/2014 for ulcerative colitis flare.  She presented to [facility] 7/5 for readmission d/t perianal pain.  S/p I&D on 08/21/13 of the large issue rectal abscess on the right side.  Further surgery (colectomy vs colostomy) postponed due to severe colitis.  Pharmacy consulted to start TPN for severe IBD with fistula and abscess.  Significant events:  7/5 I&D of complex perirectal abscess 7/20 flex sig: severe left sided colitis consistent with Crohn's  colitis; flexi-seal removed.  TPN started. 7/22: TPN titrated to goal rate 7/27: s/p laparoscopic diverting ileostomy (TPN off for ~ 9 hours)  Today:   Glucose (goal CBGs <150):  no history of diabetes, remains on Solumedrol, weaned to once daily; CBGs improving (range 121-155). Insulin added to TPN on 7/22, increased 7/23, 7/24, 7/25, 7/26 and 7/27. Also covered with SSI.  Electrolytes:  Na, Cl remain low despite addition of NaCl to TPN 7/26. All others WNL including Corrected Ca.  Renal:  SCr WNL and stable, CrCl 78 ml/min  LFTs:  WNL  TGs: 128 (7/21), 109 (7/25) - stable  Prealbumin: 19.6 (7/21), 26.9 (7/25) - rising, possibly skewed by steroids  NUTRITIONAL GOALS                                                                                             RD recs (7/26): 1900-2100 Kcal/day, 100-110 g protein / day Clinimix E 5/15 at a goal rate of 83 ml/hr + 20% fat emulsion at 57ml/hr to provide: 100 g/day protein, 1894 Kcal/day.   PLAN                                                                                                                         At 1800 today:  continue Clinimix E 5/15 at goal rate of 83 ml/hr.  Add regular  insulin,  45 units/24 hours.  Add 42 mEq/L NaCl to make TPN concentration 0.45%NS.  TPN to contain standard multivitamins and trace elements.  Continue 20% fat emulsion at 10 ml/hr.  continue NS at [facility] rate.  Continue CBGs and resistant SSI q4h.  TPN lab panels on Mondays & Thursdays.  F/u daily.   Dolly Rias RPh 09/14/2014, 10:50 AM Pager [phone]

## 2014-09-14 NOTE — Progress Notes (Signed)
Clinical Social Work  CSW continues to follow to assist with DC planning. CSW will fax out information for updated bed offers to prepare for placement.  [place], [place] [phone]

## 2014-09-14 NOTE — Progress Notes (Signed)
[facility] PROGRESS NOTE  [name] WUJ:[id number] DOB: January 24, 1941 DOA: 08/22/2014 PCP: Kandice Hams, MD  BRIEF NARRATIVE 74 y.o. female with past medical history of dyslipidemia, hypertension, long standing ulcerative colitis and recently hospitalized from 08/07/2014 through 08/21/2014 for ulcerative colitis flare. She had flexible sigmoidoscopy 08/08/2014 which demonstrated inflammatory changes from the rectum consistent with ulcerative colitis. She presented to [facility] long hospital with perianal pain started the night prior to the admission. CT scan on the admission demonstrated a complex perirectal infection extending up toward the retroperitoneal area.  On 08/21/13 , patient underwent incision and drainage of the large issue rectal abscess on the right side. She was started on vancomycin and Zosyn. Postprocedure she was doing fine but has developed large amount of bleed on the morning of 08/23/2014. GI was consulted and pt was started on prednisone and mesalamine.  Hospital course has been complicated with ongoing rectal bleed so Additionally, she was found to have left LE DVT On venous doppler done 7/12.   On 7/20, pt underwent flex sigmoidoscopy with findings of significant Lt sided disease with GI recs for continued bowel rest, PO flagyl, increased steroids, and Remicade. After several days of bowel rest, pt's WBC rose and pt's steroids were tapered for infection to subside. Patient taken to or by surgery for laproscopic diverting ileostomy on 7/27.   Assessment/Plan: Sepsis secondary to perirectal abscess Incision and drainage by surgery on 08/23/2014. Blood cultures negative. Abscess cultures showed rare yeast and multiple organisms.  -Recommended by ID for a total 14 day duration of antibiotic (was continued on fluconazole and Flagyl) with stop date on 7/18 however due to stool in abscess cavity and possible fistula antibiotic has been prolonged. Zosyn added due to worsening WBC  and encephalopathy. Flagyl was discontinued. -Patient underwent diverting ileostomy today (7/27). 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  -TPN resumed. Surgery recommended to keep her nothing by mouth until the ileostomy bag is better functioning. -Ordered PT.  Rectal bleed secondary to ulcerative colitis with acute blood loss anemia -Patient started on prednisone and mesalamine which was unable to control rectal bleed and thus started on Remicade. Steroid  being tapered due to concern for persistent abscess. -Received a total of 3 u PRBC since admission. (Hemoglobin 12 on admission which has dropped to 7-8.    Bilateral lower extremity DVT Not a candidate for anticoagulation due to ongoing rectal bleed and uncontrolled ulcerative colitis. IVC filter placed during this hospitalization.  Essential hypertension Currently nothing by mouth. Monitor on hydralazine when necessary.  Hypokalemia/hyponatremia Likely secondary to GI loss. Loree Fee being adjusted with TPN.  Generalized weakness Eventually needs skilled nursing facility.   DVT prophylaxis: SCDs  Diet: TPN  Code Status: Full code Family Communication: None at bedside Disposition Plan:  inpatient monitoring for now. SNF possibly early next week  Consultants:  CCS  GI    Procedures:  Flexible sigmoidoscopy on 7/20  Doppler lower extremity  IVC filter  PICC line  laproscopic diverting ileostomy on 7/27  Antibiotics:  Fluconazole 7/18--  Zosyn 7/25--  Flagyl 7/4-7/25  HPI/Subjective: Patient seen and examined . Reports mild abdominal comfort this morning that resolved with pain medications.   Objective: Filed Vitals:   09/14/14 0620  BP: 114/49  Pulse: 72  Temp: 98.6 F (37 C)  Resp:     Intake/Output Summary (Last 24 hours) at  09/14/14 1116 Last data filed at 09/14/14 0700   Gross per 24 hour  Intake 2717.9 ml  Output   2855 ml  Net -137.1 ml   Filed Weights   08/23/14 0153 08/23/14 0434 09/10/14 6314  Weight: 102.3 kg (225 lb 8.5 oz) 104 kg (229 lb 4.5 oz) 107.3 kg (236 lb 8.9 oz)    Exam:   General:  NAD, fatigued  HEENT:  moist mucosa, supple neck   chest: clear b/l   CVS: NS1&S2, no murmurs  GI: soft, ileostomy bag with some stool, BS+, nontender 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  Musculoskeletal: warm, no edema  Data Reviewed: Basic Metabolic Panel:  Recent Labs Lab 09/10/14 0822 09/11/14 0333 09/12/14 0820 09/13/14 0420 09/14/14 0720  NA 128* 128* 126* 132* 130*  K 4.4 4.5 4.7 4.5 4.8  CL 93* 96* 94* 95* 97*  CO2 29 29 29 30 30   GLUCOSE 183* 182* 153* 123* 98  BUN 24* 30* 29* 25* 23*  CREATININE 0.45 0.43* 0.46 0.47 0.55  CALCIUM 8.0* 7.6* 7.4* 8.0* 7.8*  MG  --  1.7  --   --  1.7  PHOS  --  3.4  --   --  4.0   Liver Function Tests:  Recent Labs Lab 09/11/14 0333 09/13/14 0420 09/14/14 0720  AST 28 30 35  ALT 27 25 23   ALKPHOS 82 87 86  BILITOT 0.5 0.5 0.7  PROT 4.8* 5.0* 4.8*  ALBUMIN 1.9* 2.0* 1.9*   No results for input(s): LIPASE, AMYLASE in the last 168 hours. No results for input(s): AMMONIA in the last 168 hours. CBC:  Recent Labs Lab 09/10/14 0730 09/11/14 0333 09/11/14 0605 09/12/14 0820 09/13/14 0420 09/14/14 0455  WBC 29.9* 25.6*  --  23.7* 20.3* 18.2*  NEUTROABS  --  22.0*  --   --   --   --   HGB 7.5* 7.4* 7.7* 7.4* 8.8* 8.7*  HCT 23.8* 22.3* 24.1* 22.5* 27.3* 26.4*  MCV 96.4 91.8  --  94.1 91.9 95.0  PLT 384 293  --  240 235 311   Cardiac Enzymes: No results for input(s): CKTOTAL, CKMB, CKMBINDEX, TROPONINI in the last 168 hours. BNP (last 3 results) No results for input(s): BNP in the last 8760 hours.  ProBNP (last 3 results) No results for input(s): PROBNP in the last 8760 hours.  CBG:  Recent Labs Lab 09/13/14 1621 09/13/14 2119 09/14/14 0009 09/14/14 0433 09/14/14 0807  GLUCAP 124* 95  114* 111* 107*    No results found for this or any previous visit (from the past 240 hour(s)).   Studies: No results found.  Scheduled Meds: . sodium chloride   Intravenous Once  . amLODipine  10 mg Oral Daily  . fluconazole (DIFLUCAN) IV  400 mg Intravenous Q24H  . insulin aspart  0-20 Units Subcutaneous Q4H  . lip balm  1 application Topical BID  . mesalamine  4.8 g Oral Q breakfast  . methylPREDNISolone (SOLU-MEDROL) injection  20 mg Intravenous Q24H  . metoprolol  2.5 mg Intravenous 4 times per day  . nystatin   Topical BID  . piperacillin-tazobactam (ZOSYN)  IV  3.375 g Intravenous 3 times per day  . saccharomyces boulardii  250 mg Oral BID  . sodium chloride  10-40 mL Intracatheter Q12H  . sodium chloride  3 mL Intravenous Q12H  . sodium chloride  3 mL Intravenous Q12H   Continuous Infusions: . Marland KitchenTPN (CLINIMIX-E) Adult 83 mL/hr at 09/13/14 1747   And  .  fat emulsion 240 mL (09/13/14 1747)  . sodium chloride 10 mL/hr at 09/13/14 1759      Time spent: [place], [place]  [facility] Pager [phone] If 7PM-7AM, please contact night-coverage at [email or web address], password [facility] 09/14/2014, 11:16 AM  LOS: 23 days

## 2014-09-14 NOTE — Progress Notes (Signed)
1 Day Post-Op  Subjective: She looks good and she has some ileostomy drainage in bag now.  No real complaints, she doesn't know if she has had more blood from her rectum or not.   Dressing changed in rectum, no stool on dressing it is clean and looks good.  It is absolute torture for her to go thru dressing change, hurts her abdomen and her buttocks, even with 6 mg of morphine.   Objective: Vital signs in last 24 hours: Temp:  [97.9 F (36.6 C)-98.6 F (37 C)] 98.6 F (37 C) (07/28 0620) Pulse Rate:  [67-97] 72 (07/28 0620) Resp:  [8-18] 16 (07/27 2122) BP: (114-154)/(49-74) 114/49 mmHg (07/28 0620) SpO2:  [95 %-100 %] 97 % (07/28 0620) Last BM Date: 09/12/14 NPO 75 from ileostomy Afebrile, VSS Na 130, K+ 4.8  WBC 18.2 H/H stable Intake/Output from previous day: 07/27 0701 - 07/28 0700 In: 4117.9 [I.V.:1610; TPN:2507.9] Out: 3055 [Urine:2980; Stool:75] Intake/Output this shift:    PE: General:  Alert and comfortable watching TV. ABD: soft, not real tender, few BS, some drainage from ileostomy, Ileostomy looks OK. Buttocks;  I will come back and look at it when nursing does dressing change.  Lab Results:   Recent Labs  09/13/14 0420 09/14/14 0455  WBC 20.3* 18.2*  HGB 8.8* 8.7*  HCT 27.3* 26.4*  PLT 235 311    BMET  Recent Labs  09/12/14 0820 09/13/14 0420  NA 126* 132*  K 4.7 4.5  CL 94* 95*  CO2 29 30  GLUCOSE 153* 123*  BUN 29* 25*  CREATININE 0.46 0.47  CALCIUM 7.4* 8.0*   PT/INR No results for input(s): LABPROT, INR in the last 72 hours.   Recent Labs Lab 09/11/14 0333 09/13/14 0420  AST 28 30  ALT 27 25  ALKPHOS 82 87  BILITOT 0.5 0.5  PROT 4.8* 5.0*  ALBUMIN 1.9* 2.0*     Lipase     Component Value Date/Time   LIPASE 13* 08/07/2014 1201     Studies/Results: No results found.  Medications: . sodium chloride   Intravenous Once  . amLODipine  10 mg Oral Daily  . fluconazole (DIFLUCAN) IV  400 mg Intravenous Q24H  . insulin  aspart  0-20 Units Subcutaneous Q4H  . lip balm  1 application Topical BID  . mesalamine  4.8 g Oral Q breakfast  . methylPREDNISolone (SOLU-MEDROL) injection  20 mg Intravenous Q24H  . metoprolol  2.5 mg Intravenous 4 times per day  . nystatin   Topical BID  . piperacillin-tazobactam (ZOSYN)  IV  3.375 g Intravenous 3 times per day  . saccharomyces boulardii  250 mg Oral BID  . sodium chloride  10-40 mL Intracatheter Q12H  . sodium chloride  3 mL Intravenous Q12H  . sodium chloride  3 mL Intravenous Q12H   . Marland KitchenTPN (CLINIMIX-E) Adult 83 mL/hr at 09/13/14 1747   And  . fat emulsion 240 mL (09/13/14 1747)  . 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 Byerly  POD 1 Anemia with transfusion Malnutrition/deconditioning on TNA Hyponatremia/Hypokalemia Antibiotics :  Ongoing from 08/07/14 including Vancomycin, Flagyl, started on Zosyn 08/22/14 changed back to Flagyl 7/20-7/25;   Currently Zosyn again 7/25 thru today; fluconazole, started on 08/22/14 thru today. DVT both lower legs  with IVC   Plan:  Continue TNA and await bowel function return, I will look at the buttocks with nursing dressing change.  Wound care will see her for the ileostomy care.  I would start to mobilize, and will check on removing foley.  Ongoing antibiotic rx.  I am decreasing the dressing change to daily wet to dry with Kerlix and see how she does with this.    LOS: 23 days    [name] 09/14/2014

## 2014-09-14 NOTE — Progress Notes (Signed)
     [facility] Progress Note  Subjective:   S/P  Laparoscopic diverting ileostomy yesterday. No N/V, c/o mild abd discomfort.   Objective:  Vital signs in last 24 hours: Temp:  [97.9 F (36.6 C)-98.6 F (37 C)] 98.6 F (37 C) (07/28 0620) Pulse Rate:  [67-97] 72 (07/28 0620) Resp:  [8-18] 16 (07/27 2122) BP: (114-154)/(49-74) 114/49 mmHg (07/28 0620) SpO2:  [95 %-100 %] 97 % (07/28 0620) Last BM Date: 09/12/14 General:   Alert,  Well-developed,    in NAD Heart:  Regular rate and rhythm; no murmurs Pulm;lungs clear Abdomen:  Soft, quiet bowel sounds,ileostomy with some drainage Neurologic: Alert and  oriented x4;  grossly normal neurologically. Psych:  Alert and cooperative. Normal mood and affect.  Intake/Output from previous day: 07/27 0701 - 07/28 0700 In: 4117.9 [I.V.:1610; TPN:2507.9] Out: 3055 [Urine:2980; Stool:75] Intake/Output this shift:    Lab Results:  Recent Labs  09/12/14 0820 09/13/14 0420 09/14/14 0455  WBC 23.7* 20.3* 18.2*  HGB 7.4* 8.8* 8.7*  HCT 22.5* 27.3* 26.4*  PLT 240 235 311   BMET  Recent Labs  09/12/14 0820 09/13/14 0420 09/14/14 0720  NA 126* 132* 130*  K 4.7 4.5 4.8  CL 94* 95* 97*  CO2 29 30 30   GLUCOSE 153* 123* 98  BUN 29* 25* 23*  CREATININE 0.46 0.47 0.55  CALCIUM 7.4* 8.0* 7.8*   LFT  Recent Labs  09/14/14 0720  PROT 4.8*  ALBUMIN 1.9*  AST 35  ALT 23  ALKPHOS 86  BILITOT 0.7     ASSESSMENT/PLAN:    74 yo female with LGIB secondary to IBD with severe colitis. Rectal abscess was not healing--pt had laparoscopic diverting ileostomy yesterday. Currently on  20 mg solumedrol and lialda.Will hope to further taper steroid as wound heals. On zosyn. Last remicade 09/01/14. Continue wound care.    LOS: 23 days   Hvozdovic, Deloris Ping 09/14/2014, Pager [phone]     Attending physician's note   I have taken an interval history, reviewed the chart and examined the patient. I agree with the  Advanced Practitioner's note, impression and recommendations. Stable post diverting ileostomy yesterday. Continue current mgmt.  Pricilla Riffle. Fuller Plan, MD [facility] [phone] pager Mon-Fri 8a-5p [phone] weekends, holidays and 5p-8a or per [facility]

## 2014-09-14 NOTE — Progress Notes (Signed)
ANTIBIOTIC CONSULT NOTE - follow up  Pharmacy Consult for Zosyn Indication: Peri-rectal abscess  Allergies  Allergen Reactions  . Clindamycin/Lincomycin Diarrhea    Leads to colitis flare  . Ivp Dye [Iodinated Diagnostic Agents] Other (See Comments)    "almost passed out"    Patient Measurements: Height: 5\' 9"  (175.3 cm) Weight:  (RN is notified about the weight stituation for pt.) IBW/kg (Calculated) : 66.2  Vital Signs: Temp: 98.6 F (37 C) (07/28 0620) Temp Source: Oral (07/28 0620) BP: 114/49 mmHg (07/28 0620) Pulse Rate: 72 (07/28 0620) Intake/Output from previous day: 07/27 0701 - 07/28 0700 In: 4117.9 [I.V.:1610; TPN:2507.9] Out: 3055 [Urine:2980; Stool:75] Intake/Output from this shift:    Labs:  Recent Labs  09/12/14 0820 09/13/14 0420 09/14/14 0455 09/14/14 0720  WBC 23.7* 20.3* 18.2*  --   HGB 7.4* 8.8* 8.7*  --   PLT 240 235 311  --   CREATININE 0.46 0.47  --  0.55   Estimated Creatinine Clearance: 78 mL/min (by C-G formula based on Cr of 0.55). No results for input(s): VANCOTROUGH, VANCOPEAK, VANCORANDOM, GENTTROUGH, GENTPEAK, GENTRANDOM, TOBRATROUGH, TOBRAPEAK, TOBRARND, AMIKACINPEAK, AMIKACINTROU, AMIKACIN in the last 72 hours.   Microbiology: Recent Results (from the past 720 hour(s))  Culture, blood (routine x 2)     Status: None   Collection Time: 08/22/14  9:30 PM  Result Value Ref Range Status   Specimen Description BLOOD LEFT HAND  Final   Special Requests BOTTLES DRAWN AEROBIC ONLY 5CC  Final   Culture   Final    NO GROWTH 5 DAYS Performed at [facility]    Report Status 08/27/2014 FINAL  Final  Anaerobic culture     Status: None   Collection Time: 08/23/14 12:03 AM  Result Value Ref Range Status   Specimen Description ABSCESS PERIRECTAL  Final   Special Requests PATIENT ON FOLLOWING ZOYSN, VANC  Final   Gram Stain   Final    NO WBC SEEN NO SQUAMOUS EPITHELIAL CELLS SEEN MODERATE GRAM NEGATIVE RODS FEW YEAST RARE GRAM  POSITIVE COCCI    Culture   Final    NO ANAEROBES ISOLATED Performed at [facility]    Report Status 08/27/2014 FINAL  Final  Culture, routine-abscess     Status: None   Collection Time: 08/23/14 12:03 AM  Result Value Ref Range Status   Specimen Description ABSCESS PERIRECTAL  Final   Special Requests PATIENT ON FOLLOWING ZOYSN, VANC  Final   Gram Stain   Final    NO WBC SEEN NO SQUAMOUS EPITHELIAL CELLS SEEN MODERATE GRAM NEGATIVE RODS RARE YEAST RARE GRAM POSITIVE COCCI    Culture   Final    MULTIPLE ORGANISMS PRESENT, NONE PREDOMINANT Note: NO STAPHYLOCOCCUS AUREUS ISOLATED NO GROUP A STREP (S.PYOGENES) ISOLATED Performed at [facility]    Report Status 08/26/2014 FINAL  Final  MRSA PCR Screening     Status: None   Collection Time: 08/23/14  1:32 AM  Result Value Ref Range Status   MRSA by PCR NEGATIVE NEGATIVE Final    Comment:        The GeneXpert MRSA Assay (FDA approved for NASAL specimens only), is one component of a comprehensive MRSA colonization surveillance program. It is not intended to diagnose MRSA infection nor to guide or monitor treatment for MRSA infections.   Culture, blood (routine x 2)     Status: None   Collection Time: 08/23/14  4:55 AM  Result Value Ref Range Status   Specimen  Description BLOOD LEFT HAND  Final   Special Requests BOTTLES DRAWN AEROBIC ONLY 5ML  Final   Culture   Final    NO GROWTH 5 DAYS Performed at [facility]    Report Status 08/28/2014 FINAL  Final  Clostridium Difficile by PCR (not at [facility])     Status: None   Collection Time: 08/28/14  7:11 PM  Result Value Ref Range Status   C difficile by pcr NEGATIVE NEGATIVE Final    Medical History: Past Medical History  Diagnosis Date  . CAD (coronary artery disease)     unspecified  . Cerebrovascular disease   . Hyperlipidemia, mixed   . HTN (hypertension)   . Ulcerative colitis   . Diverticulitis   . Colon polyps 2006  . Thyroid disease    . Myocardial infarct   . Carotid artery occlusion 2010  . Diabetes mellitus without complication     Borderline  . GERD (gastroesophageal reflux disease)   . Arthritis     Assessment: 53 y/oF with PMH of dyslipidemia, HTN, long standing ulcerative colitis and recently hospitalized from 6/20-7/4 for ulcerative colitis flare. She presented to [facility] 7/5 for readmission d/t perianal pain. S/p I&D on 7/5 of peri-rectal abscess on the right side. Hospital course complicated by ongoing rectal bleed and LE DVT. Patient underwent flex sig on 7/20 with findings of severe colitis. Consideration for possible total colectomy and ileostomy if no significant improvement with medical management.  Current antibiotics include Diflucan and Flagyl.  Pharmacy now consulted to dose Zosyn, as WBC significantly elevated and patient more encephalopathic per MD.  7/5 >> Vancomycin>> 7/9 7/5 >> Zosyn >> 7/19 7/8 >> Diflucan >> 7/19 >> Flagyl >> 7/26 7/25 >> Zosyn >>  7/5 blood: NGF 7/6 blood: NGF 7/6 abscess: multiple organisms present, none predominant. Rare yeast. No Staph aureus or group A strep isolated. No anaerobes isolated. 7/6 MRSA PCR: negative 7/11 C.diff PCR: negative  WBC: 18.2 K SCr 0.55 with CrCl ~ 78 ml/min CG Afebrile  Goal of Therapy:  Appropriate antibiotic dosing for renal function and indication Eradication of infection  Plan:  Continue Zosyn 3.375g IV q8h (infuse over 4 hours) Monitor renal function, cultures, clinical course.   Dolly Rias RPh 09/14/2014, 11:52 AM Pager [phone]

## 2014-09-14 NOTE — Consult Note (Signed)
WOC ostomy consult note Stoma type/location: RLQ Ileostomy Stomal assessment/size: 1" round pink and moist.  Viable.  Peristomal assessment: Intact.  Pouch had leaked at 8-10 o'clock.  Pouch change performed.  Treatment options for stomal/peristomal skin: Will use barrier ring for added convexity and wear time.  Output Small amount liquid brown stool.  Ostomy pouching: 2pc. 2 1/4" pouch with barrier ring  Education provided: Patient and daughter present.  Patient is groggy.  Answered questions regarding pouch change 2-3 times weekly, rationale for barrier ring and emptying when 1/3 full.   Enrolled patient in [place] program: No will make sure this is appropriate then enroll.  [place] team will continue to follow and remain available to patient, medical and nursing teams.  Domenic Moras RN BSN [place] Pager [phone]

## 2014-09-15 LAB — BASIC METABOLIC PANEL
Anion gap: 3 — ABNORMAL LOW (ref 5–15)
BUN: 25 mg/dL — AB (ref 6–20)
CALCIUM: 8.1 mg/dL — AB (ref 8.9–10.3)
CO2: 31 mmol/L (ref 22–32)
CREATININE: 0.49 mg/dL (ref 0.44–1.00)
Chloride: 97 mmol/L — ABNORMAL LOW (ref 101–111)
GFR calc Af Amer: >60 mL/min (ref >60)
Glucose, Bld: 108 mg/dL — ABNORMAL HIGH (ref 65–99)
Potassium: 4.3 mmol/L (ref 3.5–5.1)
SODIUM: 131 mmol/L — AB (ref 135–145)

## 2014-09-15 LAB — CBC
HCT: 26.8 % — ABNORMAL LOW (ref 36.0–46.0)
Hemoglobin: 8.8 g/dL — ABNORMAL LOW (ref 12.0–15.0)
MCH: 31.4 pg (ref 26.0–34.0)
MCHC: 32.8 g/dL (ref 30.0–36.0)
MCV: 95.7 fL (ref 78.0–100.0)
PLATELETS: 218 10*3/uL (ref 150–400)
RBC: 2.8 MIL/uL — AB (ref 3.87–5.11)
RDW: 18.3 % — ABNORMAL HIGH (ref 11.5–15.5)
WBC: 14.6 10*3/uL — ABNORMAL HIGH (ref 4.0–10.5)

## 2014-09-15 LAB — GLUCOSE, CAPILLARY
GLUCOSE-CAPILLARY: 130 mg/dL — AB (ref 65–99)
Glucose-Capillary: 108 mg/dL — ABNORMAL HIGH (ref 65–99)
Glucose-Capillary: 116 mg/dL — ABNORMAL HIGH (ref 65–99)
Glucose-Capillary: 173 mg/dL — ABNORMAL HIGH (ref 65–99)
Glucose-Capillary: 148 mg/dL — ABNORMAL HIGH (ref 65–99)
Glucose-Capillary: 161 mg/dL — ABNORMAL HIGH (ref 65–99)

## 2014-09-15 MED ORDER — METOPROLOL TARTRATE 25 MG PO TABS: 25.0000 mg | ORAL_TABLET | Freq: Two times a day (BID) | ORAL | Status: DC

## 2014-09-15 MED ORDER — METOPROLOL TARTRATE 25 MG PO TABS
25.0000 mg | ORAL_TABLET | Freq: Two times a day (BID) | ORAL | Status: DC
  Administered 2014-09-15 – 2014-09-21 (×12): 25 mg via ORAL
  Filled 2014-09-15 (×13): qty 1

## 2014-09-15 MED ORDER — TRACE MINERALS CR-CU-MN-SE-ZN 10-1000-500-60 MCG/ML IV SOLN
INTRAVENOUS | Status: AC
  Administered 2014-09-15: 17:00:00 via INTRAVENOUS
  Filled 2014-09-15: qty 1992

## 2014-09-15 MED ORDER — FAT EMULSION 20 % IV EMUL
240.0000 mL | INTRAVENOUS | Status: AC
  Administered 2014-09-15: 240 mL via INTRAVENOUS
  Filled 2014-09-15: qty 250

## 2014-09-15 MED ORDER — METHYLPREDNISOLONE SODIUM SUCC 40 MG IJ SOLR
10.0000 mg | INTRAMUSCULAR | Status: DC
  Administered 2014-09-16 – 2014-09-20 (×5): 10 mg via INTRAVENOUS
  Filled 2014-09-15 (×6): qty 0.25

## 2014-09-15 NOTE — Progress Notes (Signed)
[facility] Progress Note  2 Days Post-Op  Subjective: Pt c/o pain on perirectal wound with dressing changes.  Denies much pain in abdomen at surgical sites.  No N/V, flatus and green liquid stool in ileostomy bag.    Objective: Vital signs in last 24 hours: Temp:  [98.2 F (36.8 C)-98.4 F (36.9 C)] 98.2 F (36.8 C) (07/29 0541) Pulse Rate:  [64-66] 66 (07/29 0541) Resp:  [18] 18 (07/29 0541) BP: (122-127)/(57-60) 122/57 mmHg (07/29 0541) SpO2:  [97 %-98 %] 98 % (07/29 0541) Last BM Date: 09/12/14  Intake/Output from previous day: 07/28 0701 - 07/29 0700 In: 2809 [P.O.:120; IV Piggyback:550; TPN:2139] Out: 1850 [Urine:1850] Intake/Output this shift:    PE: Gen:  Alert, NAD, pleasant Abd: Soft, ND, minimal tenderness, +BS, no HSM, incisions C/D/I, ileostomy pink with green liquid stools and flatus in bag   Lab Results:   Recent Labs  09/14/14 0455 09/15/14 0427  WBC 18.2* 14.6*  HGB 8.7* 8.8*  HCT 26.4* 26.8*  PLT 311 218   BMET  Recent Labs  09/14/14 0720 09/15/14 0427  NA 130* 131*  K 4.8 4.3  CL 97* 97*  CO2 30 31  GLUCOSE 98 108*  BUN 23* 25*  CREATININE 0.55 0.49  CALCIUM 7.8* 8.1*   PT/INR No results for input(s): LABPROT, INR in the last 72 hours. CMP     Component Value Date/Time   NA 131* 09/15/2014 0427   K 4.3 09/15/2014 0427   CL 97* 09/15/2014 0427   CO2 31 09/15/2014 0427   GLUCOSE 108* 09/15/2014 0427   BUN 25* 09/15/2014 0427   CREATININE 0.49 09/15/2014 0427   CREATININE 1.08 03/28/2014 1022   CALCIUM 8.1* 09/15/2014 0427   PROT 4.8* 09/14/2014 0720   ALBUMIN 1.9* 09/14/2014 0720   AST 35 09/14/2014 0720   ALT 23 09/14/2014 0720   ALKPHOS 86 09/14/2014 0720   BILITOT 0.7 09/14/2014 0720   GFRNONAA >60 09/15/2014 0427   GFRNONAA 50* 03/28/2014 1022   GFRAA >60 09/15/2014 0427   GFRAA 58* 03/28/2014 1022   Lipase     Component Value Date/Time   LIPASE 13* 08/07/2014 1201       Studies/Results: No  results found.  Anti-infectives: Anti-infectives    Start     Dose/Rate Route Frequency Ordered Stop   09/11/14 1600  piperacillin-tazobactam (ZOSYN) IVPB 3.375 g     3.375 g 12.5 mL/hr over 240 Minutes Intravenous 3 times per day 09/11/14 1523     09/07/14 1200  fluconazole (DIFLUCAN) IVPB 400 mg     400 mg 100 mL/hr over 120 Minutes Intravenous Every 24 hours 09/06/14 2117     09/06/14 2200  metroNIDAZOLE (FLAGYL) IVPB 250 mg  Status:  Discontinued     250 mg 50 mL/hr over 60 Minutes Intravenous Every 8 hours 09/06/14 2117 09/12/14 1025   09/05/14 1500  metroNIDAZOLE (FLAGYL) tablet 250 mg  Status:  Discontinued     250 mg Oral 3 times per day 09/05/14 1440 09/06/14 2117   08/28/14 0000  fluconazole (DIFLUCAN) 200 MG tablet     400 mg Oral Daily 08/28/14 1052     08/28/14 0000  amoxicillin-clavulanate (AUGMENTIN) 875-125 MG per tablet     1 tablet Oral 2 times daily 08/28/14 1052     08/25/14 2000  vancomycin (VANCOCIN) IVPB 1000 mg/200 mL premix  Status:  Discontinued     1,000 mg 200 mL/hr over 60 Minutes Intravenous Every 12 hours 08/25/14 1009  08/26/14 0949   08/25/14 1200  fluconazole (DIFLUCAN) tablet 400 mg  Status:  Discontinued     400 mg Oral Daily 08/25/14 0943 09/06/14 2117   08/23/14 1000  vancomycin (VANCOCIN) IVPB 750 mg/150 ml premix  Status:  Discontinued     750 mg 150 mL/hr over 60 Minutes Intravenous Every 12 hours 08/22/14 2127 08/25/14 1009   08/23/14 0600  piperacillin-tazobactam (ZOSYN) IVPB 3.375 g  Status:  Discontinued     3.375 g 12.5 mL/hr over 240 Minutes Intravenous 3 times per day 08/22/14 2124 09/05/14 1356   08/22/14 2130  piperacillin-tazobactam (ZOSYN) IVPB 3.375 g     3.375 g 100 mL/hr over 30 Minutes Intravenous NOW 08/22/14 2124 08/22/14 2220   08/22/14 2130  [MAR Hold]  vancomycin (VANCOCIN) 2,000 mg in sodium chloride 0.9 % 500 mL IVPB     (MAR Hold since 08/22/14 2338)   2,000 mg 250 mL/hr over 120 Minutes Intravenous NOW 08/22/14 2127  08/23/14 0008       Assessment/Plan 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 Zella Richer 08/23/14 POD #2 S/p Laparoscopic diverting ileostomy, exam under anesthesia and debridement of skin 2 cm2, 09/13/14, DR. Byerly  -Continue TNA for now, wean tomorrow if tolerating fulls -Tolerated clears, but really wants to eat something better, she doesn't like the clear options.  Flatus and greenish liquid stools in bag, advance to fulls.  Instructed her to eat very slowly.  She doesn't like supplements either. -WOC nursing to teach ostomy mgt -Ambulate and IS -Daily WD kerlix dressing changes for now because she has significant pain during each change -Consider discontinuing foley ABL Anemia with transfusion -Hgb 8.8 today PCM/deconditioning on TNA Hyponatremia/Hypokalemia Antibiotics : Ongoing from 08/07/14 including Vancomycin, Flagyl, started on Zosyn 08/22/14 changed back to Flagyl 7/20-7/25; Currently Zosyn again 7/25 thru today; fluconazole, started on 08/22/14 thru today. DVT both lower legs with IVC    LOS: 24 days    Nat Christen 09/15/2014, 8:29 AM Pager: [phone]

## 2014-09-15 NOTE — Progress Notes (Signed)
Nutrition Follow-up  DOCUMENTATION CODES:   Obesity unspecified  INTERVENTION:  - Continue TPN per pharmacy - Continue FLD and continue to advance as medically feasible - RD will continue to monitor for needs  NUTRITION DIAGNOSIS:   Altered GI function related to acute illness as evidenced by other (see comment) (fistula requiring TPN). -ongoing  GOAL:   Patient will meet greater than or equal to 90% of their needs  MONITOR:   Weight trends, Labs, I & O's, Other (Comment) (TPN regimen)  ASSESSMENT:  74 y.o. female with past medical history of dyslipidemia, hypertension, long standing ulcerative colitis and recently hospitalized from 08/07/2014 through 08/21/2014 for ulcerative colitis flare. She had flexible sigmoidoscopy 08/08/2014 which demonstrated inflammatory changes from the rectum consistent with ulcerative colitis.  She presented to [facility] long hospital with perianal pain started the night prior to the admission. CT scan on the admission demonstrated a complex perirectal infection extending up toward the retroperitoneal area.  7/29 - Pt's diet advanced to CLD yesterday afternoon with good tolerance and was, therefore, advanced again this AM to FLD - Pt continues with TPN. Per pharmacy note this AM: At 1800 today:  continue Clinimix E 5/15 at goal rate of 83 ml/hr.  Add regular insulin, 45 units/24 hours.  Add 42 mEq/L NaCl to make TPN concentration 0.45%NS.-will not increase NaCl due to advancement to full liquid diet today  TPN to contain standard multivitamins and trace elements.  Continue 20% fat emulsion at 10 ml/hr.  Plan on wean tomorrow if tolerating fulls per surgery TPN is providing 1894 kcal, 100 grams of protein.  Meeting needs with TPN alone at this time. Medications reviewed. Labs reviewed; CBGs: 108-173 mg/dL, Na: 131 mmol/L, Cl: 97 mmol/L, BUN elevated, Ca: 8.1 mg/dL.    7/26 - Pt has been NPO since midnight pending diagnostic laparoscopy with  diverting loop ileostomy and possible EUA today - Pt continues with Clinimix E 5/15 @ 83 mL/hr and 20% lipids @ 10 mL/hr which is providing 1894 kcal, 100 grams protein - Meeting needs with TPN alone; will monitor for diet advancement following surgery - Medications reviewed. Labs reviewed; CBGs: 148-208 mg/dL, Na: 126 mmol/L, Cl: 94 mmol/L, BUN elevated, Ca: 7.4 mg/dL.   7/22 Pt currently receiving Clinimix E 5/15 @ 60 ml/hr with 20% lipids @ 10 mL/hr which is providing 1502 kcal, 72 grams of protein; not meeting needs.  Plan per pharmacy for today: At 1800 today:  Increase Clinimix E 5/15 to 83 ml/hr  Continue 20% fat emulsion at 17ml/hr.  TPN to contain standard multivitamins and trace elements.  Provide insulin 15 units/ 24hrs via TPN  Continue IVF at 10 ml/hr.  Continue moderate SSI q4h .  TPN lab panels on Mondays & Thursdays.  7/21 On 7/5 pt underwent I&D of large, R-sided rectal abscess and on 7/20 underwent flex sig which indicated significant L sided disease. Per [place] RN note 7/20, pt to undergo colostomy placement today (7/21).  Goal rate per pharmacy: Clinimix E 5/15 @ 70 mL/hr with 20% lipids @ 10 mL/hr which will provide 84 grams protein and 1673 kcal which will not meet needs. Recommend increase to 83 mL/hr which will provide 1894 kcal and 100 grams protein.  Diet Order:  Diet - low sodium heart healthy TPN (CLINIMIX-E) Adult Diet full liquid Room service appropriate?: Yes; Fluid consistency:: Thin TPN (CLINIMIX-E) Adult  Skin:  Wound (see comment) (perineum surgical incision)  Last BM:  7/28  Height:   Ht Readings from Last  1 Encounters:  08/22/14 5\' 9"  (1.753 m)    Weight:   Wt Readings from Last 1 Encounters:  08/07/14 229 lb 12.8 oz (104.237 kg)    Ideal Body Weight:  72.73 kg (kg)  BMI:  Body mass index is 34.92 kg/(m^2).  Estimated Nutritional Needs:   Kcal:  1900-2100  Protein:  100-110 grams  Fluid:  2.2-2.5 L/day  EDUCATION NEEDS:    No education needs identified at this time     Jarome Matin, RD, LDN Inpatient Clinical Dietitian Pager # [phone] After hours/weekend pager # [phone]

## 2014-09-15 NOTE — Progress Notes (Signed)
Clinical Social Work  Per MD, patient might be ready to DC early next week to SNF. CSW met with patient at bedside who reports she is still interested in SNF and would like to stay in [facility]. CSW explained process and patient asked CSW to call her dtr (Ramona) to explain SNF process as well. CSW updated information and completed county wide SNF search. CSW will follow up with bed offers.  [place], [place] [phone]

## 2014-09-15 NOTE — Progress Notes (Signed)
[facility] NOTE  Pharmacy Consult for TPN Indication: severe IBD with fistula and abscess  Allergies  Allergen Reactions  . Clindamycin/Lincomycin Diarrhea    Leads to colitis flare  . Ivp Dye [Iodinated Diagnostic Agents] Other (See Comments)    "almost passed out"    Patient Measurements: Height: 5\' 9"  (175.3 cm) Weight:  (RN is notified about the weight stituation for pt.) IBW/kg (Calculated) : 66.2 Adjusted Body Weight: 81.3kg  Vital Signs: Temp: 98.2 F (36.8 C) (07/29 0541) Temp Source: Oral (07/29 0541) BP: 122/57 mmHg (07/29 0541) Pulse Rate: 66 (07/29 0541) Intake/Output from previous day: 07/28 0701 - 07/29 0700 In: 2809 [P.O.:120; IV Piggyback:550; LZJ:6734] Out: 1850 [Urine:1850] Intake/Output from this shift:    Labs:  Recent Labs  09/13/14 0420 09/14/14 0455 09/15/14 0427  WBC 20.3* 18.2* 14.6*  HGB 8.8* 8.7* 8.8*  HCT 27.3* 26.4* 26.8*  PLT 235 311 218     Recent Labs  09/13/14 0420 09/14/14 0720 09/15/14 0427  NA 132* 130* 131*  K 4.5 4.8 4.3  CL 95* 97* 97*  CO2 30 30 31   GLUCOSE 123* 98 108*  BUN 25* 23* 25*  CREATININE 0.47 0.55 0.49  CALCIUM 8.0* 7.8* 8.1*  MG  --  1.7  --   PHOS  --  4.0  --   PROT 5.0* 4.8*  --   ALBUMIN 2.0* 1.9*  --   AST 30 35  --   ALT 25 23  --   ALKPHOS 87 86  --   BILITOT 0.5 0.7  --    Estimated Creatinine Clearance: 78 mL/min (by C-G formula based on Cr of 0.49).    Recent Labs  09/15/14 0019 09/15/14 0407 09/15/14 0736  GLUCAP 130* 108* 116*     Medications: Continuous Infusions: . sodium chloride 10 mL/hr at 09/13/14 1759  . Marland KitchenTPN (CLINIMIX-E) Adult 83 mL/hr at 09/14/14 1803   And  . fat emulsion 240 mL (09/14/14 1803)   Insulin Requirements:  Resistant SSI: 14 units / 24 hrs Insulin in TPN: 45 units/ 24 hrs   Current Nutrition:  Diet: clears TPN @ 83 ml/hr, Lipids @ 10 ml/hr  IVF: NS @ KVO   Central access: PICC placed 08/30/14 TPN start date:   7/20  ASSESSMENT                                                                                                          HPI:  74 y.o. female with past medical history of dyslipidemia, hypertension, long standing ulcerative colitis and recently hospitalized from 08/07/2014 through 08/21/2014 for ulcerative colitis flare.  [facility]/t [facility]ianal pain.  S/p I&D on 08/21/13 of the large issue rectal abscess on the right side.  Further surgery (colectomy vs colostomy) postponed due to severe colitis.  Pharmacy consulted to start TPN for severe IBD with fistula and abscess.  Significant events:  7/5 I&D of complex perirectal abscess 7/20 flex sig: severe left sided colitis consistent with Crohn's colitis; flexi-seal removed.  TPN started. 7/22: TPN titrated to goal rate 7/27: s/p laparoscopic diverting ileostomy (TPN off for ~ 9 hours)  Today:   Glucose (goal CBGs <150):  no history of diabetes, remains on once daily Solumedrol, CBGs improving (range 121-155). Insulin added to TPN on 7/22, increased 7/23, 7/24, 7/25, 7/26 and 7/27. Also covered with SSI.  Electrolytes:  Na, Cl remain low despite addition of NaCl to TPN 7/26. All others WNL including Corrected Ca.  Renal:  SCr WNL and stable, CrCl 78 ml/min  LFTs:  WNL  TGs: 128 (7/21), 109 (7/25) - stable  Prealbumin: 19.6 (7/21), 26.9 (7/25) - rising, possibly skewed by steroids  Diet advanced to full liquid  NUTRITIONAL GOALS                                                                                             RD recs (7/26): 1900-2100 Kcal/day, 100-110 g protein / day Clinimix E 5/15 at a goal rate of 83 ml/hr + 20% fat emulsion at 82ml/hr to provide: 100 g/day protein, 1894 Kcal/day.   PLAN                                                                                                                         At 1800 today:  continue Clinimix E 5/15 at goal rate of 83  ml/hr.  Add regular insulin,  45 units/24 hours.  Add 42 mEq/L NaCl to make TPN concentration 0.45%NS.-will not increase NaCl due to advancement to full liquid diet today  TPN to contain standard multivitamins and trace elements.  Continue 20% fat emulsion at 10 ml/hr.  Plan on wean tomorrow if tolerating fulls per surgery  continue NS at [facility] rate.  Continue CBGs and resistant SSI q4h.  TPN lab panels on Mondays & Thursdays.  F/u daily.   Dolly Rias RPh 09/15/2014, 9:37 AM Pager [phone]

## 2014-09-15 NOTE — Progress Notes (Signed)
[facility] PROGRESS NOTE  KYMORA SCIARA FAO:[id number] DOB: 03-Aug-1940 DOA: 08/22/2014 PCP: Kandice Hams, MD  BRIEF NARRATIVE 74 y.o. female with past medical history of dyslipidemia, hypertension, long standing ulcerative colitis and recently hospitalized from 08/07/2014 through 08/21/2014 for ulcerative colitis flare. She had flexible sigmoidoscopy 08/08/2014 which demonstrated inflammatory changes from the rectum consistent with ulcerative colitis. She presented to [facility] hospital with perianal pain started the night prior to the admission. CT scan on the admission demonstrated a complex perirectal infection extending up toward the retroperitoneal area.  On 08/21/13 , patient underwent incision and drainage of the large issue rectal abscess on the right side. She was started on vancomycin and Zosyn. Postprocedure she was doing fine but has developed large amount of bleed on the morning of 08/23/2014. GI was consulted and pt was started on prednisone and mesalamine.  Hospital course has been complicated with ongoing rectal bleed so Additionally, she was found to have left LE DVT On venous doppler done 7/12.   On 7/20, pt underwent flex sigmoidoscopy with findings of significant Lt sided disease with GI recs for continued bowel rest, PO flagyl, increased steroids, and Remicade. After several days of bowel rest, pt's WBC rose and pt's steroids were tapered for infection to subside. Patient taken to or by surgery for laproscopic diverting ileostomy on 7/27.   Assessment/Plan: Sepsis secondary to perirectal abscess Incision and drainage by surgery on 08/23/2014. Blood cultures negative. Abscess cultures showed rare yeast and multiple organisms.  -Recommended by ID for a total 14 day duration of antibiotic (was continued on fluconazole and Flagyl) with stop date on 7/18 however due to stool in abscess cavity and possible fistula antibiotic has been prolonged. Zosyn added due to worsening WBC  and encephalopathy. Flagyl was discontinued. Will discuss with surgery in a.m. regarding antibiotic duration. -Diverting ileostomy on 7/27. 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 Ileostomy functioning well. Seen by wound care. -Started on clears. PT recommends skilled nursing facility.   Rectal bleed secondary to ulcerative colitis with acute blood loss anemia -Patient started on prednisone and mesalamine which was unable to control rectal bleed and thus started on Remicade. Steroid  being tapered due to concern for persistent abscess. (Now on 10 mg Solu-Medrol daily) -Received a total of 3 u PRBC since admission. (Hemoglobin 12 on admission which has dropped to 7-8. Leukocytosis improving.    Bilateral lower extremity DVT Not a candidate for anticoagulation due to ongoing rectal bleed and uncontrolled ulcerative colitis. IVC filter placed during this hospitalization.    Essential hypertension Tolerating clears. Resume home dose metoprolol. Continue when necessary hydralazine  Hypokalemia/hyponatremia Likely secondary to GI loss. Loree Fee being adjusted with TPN.  Generalized weakness PT evaluation pending. Eventually needs skilled nursing facility.  Protein calorie malnutrition,  Nutritionist  following   DVT prophylaxis: SCDs  Diet: clear Liquid. Weaning off TPN  Code Status: Full code Family Communication: None at bedside Disposition Plan:   SNF possibly early next week  Consultants:  CCS  GI    Procedures:  Flexible sigmoidoscopy on 7/20  Doppler lower extremity  IVC filter  PICC line  laproscopic diverting ileostomy on 7/27  Antibiotics:  Fluconazole 7/18--  Zosyn 7/25--  Flagyl 7/4-7/25  HPI/Subjective: Patient seen and examined . Abdominal pain better. Tolerating clears.   Objective: Filed Vitals:   09/15/14 0541  BP:  122/57  Pulse: 66  Temp: 98.2 F (36.8 C)  Resp: 18    Intake/Output Summary (Last 24 hours) at 09/15/14 1348 Last data filed at 09/15/14 1005  Gross per 24 hour  Intake   3169 ml  Output   2500 ml  Net    669 ml   Filed Weights   08/23/14 0153 08/23/14 0434 09/10/14 8527  Weight: 102.3 kg (225 lb 8.5 oz) 104 kg (229 lb 4.5 oz) 107.3 kg (236 lb 8.9 oz)    Exam:   General:  NAD, fatigued  HEENT:  moist mucosa, supple neck   chest: clear b/l   CVS: NS1&S2, no murmurs  GI: soft, ileostomy bag with some stool, BS+, nontender 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, did not examine perirectal area.  Musculoskeletal: warm, no edema  Data Reviewed: Basic Metabolic Panel:  Recent Labs Lab 09/11/14 0333 09/12/14 0820 09/13/14 0420 09/14/14 0720 09/15/14 0427  NA 128* 126* 132* 130* 131*  K 4.5 4.7 4.5 4.8 4.3  CL 96* 94* 95* 97* 97*  CO2 29 29 30 30 31   GLUCOSE 182* 153* 123* 98 108*  BUN 30* 29* 25* 23* 25*  CREATININE 0.43* 0.46 0.47 0.55 0.49  CALCIUM 7.6* 7.4* 8.0* 7.8* 8.1*  MG 1.7  --   --  1.7  --   PHOS 3.4  --   --  4.0  --    Liver Function Tests:  Recent Labs Lab 09/11/14 0333 09/13/14 0420 09/14/14 0720  AST 28 30 35  ALT 27 25 23   ALKPHOS 82 87 86  BILITOT 0.5 0.5 0.7  PROT 4.8* 5.0* 4.8*  ALBUMIN 1.9* 2.0* 1.9*   No results for input(s): LIPASE, AMYLASE in the last 168 hours. No results for input(s): AMMONIA in the last 168 hours. CBC:  Recent Labs Lab 09/11/14 0333 09/11/14 0605 09/12/14 0820 09/13/14 0420 09/14/14 0455 09/15/14 0427  WBC 25.6*  --  23.7* 20.3* 18.2* 14.6*  NEUTROABS 22.0*  --   --   --   --   --   HGB 7.4* 7.7* 7.4* 8.8* 8.7* 8.8*  HCT 22.3* 24.1* 22.5* 27.3* 26.4* 26.8*  MCV 91.8  --  94.1 91.9 95.0 95.7  PLT 293  --  240 235 311 218   Cardiac Enzymes: No results for input(s): CKTOTAL, CKMB, CKMBINDEX, TROPONINI in the last 168 hours. BNP (last 3 results) No results for input(s): BNP in the last 8760 hours.  ProBNP (last 3  results) No results for input(s): PROBNP in the last 8760 hours.  CBG:  Recent Labs Lab 09/14/14 2002 09/15/14 0019 09/15/14 0407 09/15/14 0736 09/15/14 1123  GLUCAP 167* 130* 108* 116* 173*    No results found for this or any previous visit (from the past 240 hour(s)).   Studies: No results found.  Scheduled Meds: . sodium chloride   Intravenous Once  . amLODipine  10 mg Oral Daily  . fluconazole (DIFLUCAN) IV  400 mg Intravenous Q24H  . insulin aspart  0-20 Units Subcutaneous Q4H  . lip balm  1 application Topical BID  . mesalamine  4.8 g Oral Q breakfast  . [START ON 09/16/2014] methylPREDNISolone (SOLU-MEDROL) injection  10 mg Intravenous Q24H  . metoprolol  2.5 mg Intravenous 4 times per day  . nystatin   Topical BID  . piperacillin-tazobactam (ZOSYN)  IV  3.375 g Intravenous 3 times per day  . saccharomyces boulardii  250 mg Oral BID  . sodium chloride  10-40 mL Intracatheter Q12H  . sodium  chloride  3 mL Intravenous Q12H  . sodium chloride  3 mL Intravenous Q12H   Continuous Infusions: . sodium chloride 10 mL/hr at 09/13/14 1759  . Marland KitchenTPN (CLINIMIX-E) Adult 83 mL/hr at 09/14/14 1803   And  . fat emulsion 240 mL (09/14/14 1803)  . Marland KitchenTPN (CLINIMIX-E) Adult     And  . fat emulsion        Time spent: [place], [place]  [facility] Pager [phone] If 7PM-7AM, please contact night-coverage at [email or web address], password [facility] 09/15/2014, 1:48 PM  LOS: 24 days

## 2014-09-15 NOTE — Care Management Important Message (Signed)
Important Message  Patient Details  Name: [name] MRN: [id number] Date of Birth: September 16, 1940   Medicare Important Message Given:  Yes-second notification given    Camillo Flaming 09/15/2014, 1:10 PMImportant Message  Patient Details  Name: [name] MRN: [id number] Date of Birth: 06/05/1940   Medicare Important Message Given:  Yes-second notification given    Camillo Flaming 09/15/2014, 1:09 PM

## 2014-09-15 NOTE — Consult Note (Addendum)
WOC ostomy follow up [facility] following for assessment and plan of care to abd wound. Stoma type/location: Pt had ileostomy surgery on 7/27.  Current pouch intact to RLQ with good seal, pouch change and teaching session was performed yesterday.   Stomal assessment/size: Stoma red and viable when visualized through pouch Output: Emptied 100cc of liquid green stool.  Patient watched how to empty and close with velcro but did not assist. Ostomy pouching: 2pc.  Education provided: No family members are present and pt states she feels poorly.  [place] team can follow on Monday for further teaching session.  Educational materials left at bedside and extra supplies are in the room for staff nurse use.  EMR indicates that patient plans go to SNF after discharge, where she will have further assistance with pouch application and emptying. Enrolled patient in [place] Start Discharge program: Yes Julien Girt MSN, RN, [place], [place], [place]

## 2014-09-15 NOTE — Progress Notes (Signed)
Date:  September 15, 2014 U.R. performed for needs and level of care. Total colectomy and ielostomy performed on [id number]/ Will continue to follow for Case Management needs.  Velva Harman, RN, BSN, [place]   [phone]

## 2014-09-15 NOTE — Clinical Social Work Placement (Signed)
   CLINICAL SOCIAL WORK PLACEMENT  NOTE  Date:  09/15/2014  Patient Details  Name: [name] MRN: [id number] Date of Birth: 01-30-1941  Clinical Social Work is seeking post-discharge placement for this patient at the [place] level of care (*CSW will initial, date and re-position this form in  chart as items are completed):  Yes   Patient/family provided with [facility] Work Department's list of facilities offering this level of care within the geographic area requested by the patient (or if unable, by the patient's family).  Yes   Patient/family informed of their freedom to choose among providers that offer the needed level of care, that participate in Medicare, Medicaid or managed care program needed by the patient, have an available bed and are willing to accept the patient.  Yes   Patient/family informed of [facility]'s ownership interest in [facility] and [facility], as well as of the fact that they are under no obligation to receive care at these facilities.  PASRR submitted to EDS on       PASRR number received on       Existing PASRR number confirmed on 09/15/14     FL2 transmitted to all facilities in geographic area requested by pt/family on 09/15/14     FL2 transmitted to all facilities within larger geographic area on       Patient informed that his/her managed care company has contracts with or will negotiate with certain facilities, including the following:            Patient/family informed of bed offers received.  Patient chooses bed at       Physician recommends and patient chooses bed at      Patient to be transferred to   on  .  Patient to be transferred to facility by       Patient family notified on   of transfer.  Name of family member notified:        PHYSICIAN       Additional Comment:    _______________________________________________ Boone Master, [place] 09/15/2014, 10:10 AM

## 2014-09-15 NOTE — Progress Notes (Signed)
     [facility] Progress Note  Subjective:   POD 2, s/p laparoscopic diverting ileostomy.WBC 14.6, Hgb 8.8. On 20 mg solumedrol, lialda, zosyn. Hungry. Passing some gas via ostomy.    Objective:  Vital signs in last 24 hours: Temp:  [98.2 F (36.8 C)-98.4 F (36.9 C)] 98.2 F (36.8 C) (07/29 0541) Pulse Rate:  [64-66] 66 (07/29 0541) Resp:  [18] 18 (07/29 0541) BP: (122-127)/(57-60) 122/57 mmHg (07/29 0541) SpO2:  [97 %-98 %] 98 % (07/29 0541) Last BM Date: 09/12/14 General:   Alert,  Well-developed,    in NAD Heart:  Regular rate and rhythm; no murmurs Pulm;lungs clear Abdomen:  Soft,green liquid in ostomy,Normal bowel sounds, without guarding, and without rebound.   Neurologic: Alert and  oriented x4;  grossly normal neurologically. Psych:  Alert and cooperative. Normal mood and affect.  Intake/Output from previous day: 07/28 0701 - 07/29 0700 In: 2809 [P.O.:120; IV Piggyback:550; TPN:2139] Out: 1850 [Urine:1850] Intake/Output this shift:    Lab Results:  Recent Labs  09/13/14 0420 09/14/14 0455 09/15/14 0427  WBC 20.3* 18.2* 14.6*  HGB 8.8* 8.7* 8.8*  HCT 27.3* 26.4* 26.8*  PLT 235 311 218   BMET  Recent Labs  09/13/14 0420 09/14/14 0720 09/15/14 0427  NA 132* 130* 131*  K 4.5 4.8 4.3  CL 95* 97* 97*  CO2 30 30 31   GLUCOSE 123* 98 108*  BUN 25* 23* 25*  CREATININE 0.47 0.55 0.49  CALCIUM 8.0* 7.8* 8.1*   LFT  Recent Labs  09/14/14 0720  PROT 4.8*  ALBUMIN 1.9*  AST 35  ALT 23  ALKPHOS 43  BILITOT 0.7     ASSESSMENT/PLAN:   74 yo female with LGIB secondary to IBD with severe colitis. Rectal abscess was not healing--pt had laparoscopic diverting ileostomy 2 days ago. Currently on 20 mg solumedrol and lialda. Will hope to further taper steroid as wound heals. On zosyn. Last remicade 09/01/14. Continue wound care.    LOS: 24 days   Hvozdovic, Deloris Ping 09/15/2014, Pager [phone]    Attending physician's note   I have  taken an interval history, reviewed the chart and examined the patient. I agree with the Advanced Practitioner's note, impression and recommendations. Continue current mgmt. Hopefully rectal abscess will resolve following diverting ileostomy. Continue current medications. We are available over weekend for questions/problems. Will plan to see again on Monday. Will taper Solumedrol to 10 mg daily.   Pricilla Riffle. Fuller Plan, MD Marval Regal [phone] pager Mon-Fri 8a-5p [phone] weekends, holidays and 5p-8a or per [facility]

## 2014-09-15 NOTE — Progress Notes (Signed)
Physical Therapy Re-evaluation Patient Details Name: NESA DISTEL MRN: [id number] DOB: 1940/08/04 Today's Date: 09/15/2014    History of Present Illness 74 yo female adm with complex perirectal infection extending up toward the retroperitoneal area; pt underwent I& D on 08/23/14, bil LE DVTs-s/p IVC vilter. S/P diverting ileostomy 09/13/14. PMHx:  HTN, CVA, ulcerative colitis    PT Comments    Re-eval today. Mod-Max assist +2 for bed mobility. Sat EOB and worked on static + dynamic sitting balance and activity tolerance. Pt tolerated session fairly well. Too fatigued to attempt standing on today. Recommend ST rehab for continued therapy.   Follow Up Recommendations  SNF     Equipment Recommendations  None recommended by PT    Recommendations for Other Services       Precautions / Restrictions Precautions Precautions: Fall Restrictions Weight Bearing Restrictions: No    Mobility  Bed Mobility Overal bed mobility: Needs Assistance Bed Mobility: Supine to Sit;Sit to Supine     Supine to sit: Mod assist;+2 for physical assistance;+2 for safety/equipment;HOB elevated Sit to supine: Max assist;+2 for physical assistance;+2 for safety/equipment   General bed mobility comments: increased time, assist for trunk and bil LEs. Utilized bedpad for scooting, positioning  Transfers                 General transfer comment: NT-pt unable on today. Too weak/fatigued  Ambulation/Gait                 Stairs            Wheelchair Mobility    Modified Rankin (Stroke Patients Only)       Balance   Sitting-balance support: Feet supported Sitting balance-Leahy Scale: Poor Sitting balance - Comments: sat EOB ~15 minutes with varying level of assist: Min-Min guard. Worked on anterior weightshifting.                             Cognition Arousal/Alertness: Awake/alert Behavior During Therapy: WFL for tasks assessed/performed Overall Cognitive Status:  Within Functional Limits for tasks assessed                      Exercises General Exercises - Lower Extremity Long Arc Quad: AROM;Both;10 reps;Seated    General Comments        Pertinent Vitals/Pain Pain Assessment: Faces Faces Pain Scale: Hurts little more Pain Location: R side Pain Descriptors / Indicators: Sore Pain Intervention(s): Limited activity within patient's tolerance;Repositioned    Home Living                      Prior Function            PT Goals (current goals can now be found in the care plan section) Progress towards PT goals: Progressing toward goals (slowly)    Frequency  Min 3X/week    PT Plan Current plan remains appropriate    Co-evaluation             End of Session   Activity Tolerance: Patient limited by fatigue Patient left: in bed;with call bell/phone within reach     Time: 1134-1159 PT Time Calculation (min) (ACUTE ONLY): 25 min  Charges:  $Therapeutic Activity: 8-22 mins                    G Codes:      Weston Anna, MPT Pager: [phone]

## 2014-09-16 LAB — CBC
HCT: 28.9 % — ABNORMAL LOW (ref 36.0–46.0)
Hemoglobin: 9.5 g/dL — ABNORMAL LOW (ref 12.0–15.0)
MCH: 31.1 pg (ref 26.0–34.0)
MCHC: 32.9 g/dL (ref 30.0–36.0)
MCV: 94.8 fL (ref 78.0–100.0)
PLATELETS: 247 10*3/uL (ref 150–400)
RBC: 3.05 MIL/uL — AB (ref 3.87–5.11)
RDW: 18 % — ABNORMAL HIGH (ref 11.5–15.5)
WBC: 17 10*3/uL — ABNORMAL HIGH (ref 4.0–10.5)

## 2014-09-16 LAB — GLUCOSE, CAPILLARY
GLUCOSE-CAPILLARY: 129 mg/dL — AB (ref 65–99)
GLUCOSE-CAPILLARY: 133 mg/dL — AB (ref 65–99)
GLUCOSE-CAPILLARY: 134 mg/dL — AB (ref 65–99)
GLUCOSE-CAPILLARY: 140 mg/dL — AB (ref 65–99)
Glucose-Capillary: 109 mg/dL — ABNORMAL HIGH (ref 65–99)
Glucose-Capillary: 112 mg/dL — ABNORMAL HIGH (ref 65–99)

## 2014-09-16 MED ORDER — FAT EMULSION 20 % IV EMUL
240.0000 mL | INTRAVENOUS | Status: AC
  Administered 2014-09-16: 240 mL via INTRAVENOUS
  Filled 2014-09-16: qty 250

## 2014-09-16 MED ORDER — TRACE MINERALS CR-CU-MN-SE-ZN 10-1000-500-60 MCG/ML IV SOLN
INTRAVENOUS | Status: AC
  Administered 2014-09-16: 18:00:00 via INTRAVENOUS
  Filled 2014-09-16: qty 1992

## 2014-09-16 MED ORDER — SODIUM CHLORIDE 1 G PO TABS
2.0000 g | ORAL_TABLET | Freq: Two times a day (BID) | ORAL | Status: DC
  Administered 2014-09-16 – 2014-09-21 (×11): 2 g via ORAL
  Filled 2014-09-16 (×13): qty 2

## 2014-09-16 NOTE — Progress Notes (Signed)
[facility] NOTE  Pharmacy Consult for TPN Indication: severe IBD with fistula and abscess  Allergies  Allergen Reactions  . Clindamycin/Lincomycin Diarrhea    Leads to colitis flare  . Ivp Dye [Iodinated Diagnostic Agents] Other (See Comments)    "almost passed out"    Patient Measurements: Height: 5\' 9"  (175.3 cm) Weight:  (RN is notified about the weight stituation for pt.) IBW/kg (Calculated) : 66.2 Adjusted Body Weight: 81.3kg  Vital Signs: Temp: 98.6 F (37 C) (07/30 0425) Temp Source: Oral (07/30 0425) BP: 130/60 mmHg (07/30 0425) Pulse Rate: 79 (07/30 0425) Intake/Output from previous day: 07/29 0701 - 07/30 0700 In: 2889 [P.O.:600; IV Piggyback:150; TPN:2139] Out: 4625 [Urine:4500; Stool:125] Intake/Output from this shift: Total I/O In: -  Out: 750 [Urine:750]  Labs:  Recent Labs  09/14/14 0455 09/15/14 0427 09/16/14 0555  WBC 18.2* 14.6* 17.0*  HGB 8.7* 8.8* 9.5*  HCT 26.4* 26.8* 28.9*  PLT 311 218 247     Recent Labs  09/14/14 0720 09/15/14 0427  NA 130* 131*  K 4.8 4.3  CL 97* 97*  CO2 30 31  GLUCOSE 98 108*  BUN 23* 25*  CREATININE 0.55 0.49  CALCIUM 7.8* 8.1*  MG 1.7  --   PHOS 4.0  --   PROT 4.8*  --   ALBUMIN 1.9*  --   AST 35  --   ALT 23  --   ALKPHOS 86  --   BILITOT 0.7  --    Estimated Creatinine Clearance: 78 mL/min (by C-G formula based on Cr of 0.49).    Recent Labs  09/15/14 2353 09/16/14 0418 09/16/14 0727  GLUCAP 134* 140* 109*     Medications: Continuous Infusions: . sodium chloride 10 mL/hr at 09/13/14 1759  . Marland KitchenTPN (CLINIMIX-E) Adult 83 mL/hr at 09/15/14 1709   And  . fat emulsion 240 mL (09/15/14 1709)   Insulin Requirements:  Resistant SSI: 15 units yesterday Insulin in TPN: 45 units/24 hrs   Current Nutrition:  Diet: clears TPN @ 83 ml/hr, Lipids @ 10 ml/hr  IVF: NS @ KVO   Central access: PICC placed 08/30/14 TPN start date:  7/20  ASSESSMENT                                                                                                           HPI:  74 y.o. female with past medical history of dyslipidemia, hypertension, long standing ulcerative colitis and recently hospitalized from 08/07/2014 through 08/21/2014 for ulcerative colitis flare.  She presented to [facility] 7/5 for readmission d/t perianal pain.  S/p I&D on 08/21/13 of the large issue rectal abscess on the right side.  Further surgery (colectomy vs colostomy) postponed due to severe colitis.  Pharmacy consulted to start TPN for severe IBD with fistula and abscess.  Significant events:  7/5 I&D of complex perirectal abscess 7/20 flex sig: severe left sided colitis consistent with Crohn's colitis; flexi-seal removed.  TPN started. 7/22: TPN titrated to goal rate 7/27: s/p laparoscopic diverting ileostomy (TPN off for ~  9 hours) 7/29: diet advanced to full liquid, but returned to CLD in afternoon. 7/30: Minimal intake overnight d/t abd pain   Today:   Glucose (goal CBGs <150):  no history of diabetes, remains on once daily Solumedrol, CBGs improved (range 109-173; mostly at goal). Insulin added to TPN on 7/22.  Electrolytes:  Na, Cl remain low despite addition of NaCl to TPN 7/26. All others WNL including Corrected Ca.  Renal:  SCr WNL and stable, CrCl 78 ml/min  LFTs:  WNL, alb low  TGs: 128 (7/21), 109 (7/25) - stable  Prealbumin: 19.6 (7/21), 26.9 (7/25) - rising, possibly skewed by steroids  NUTRITIONAL GOALS                                                                                             RD recs (7/26): 1900-2100 Kcal/day, 100-110 g protein / day Clinimix E 5/15 at a goal rate of 83 ml/hr + 20% fat emulsion at 1ml/hr to provide: 100 g/day protein, 1894 Kcal/day.   PLAN                                                                                                                         At 1800 today:  continue Clinimix E 5/15 at goal rate of 83 ml/hr.  Add  regular insulin,  45 units/24 hours.  TPN to contain standard multivitamins and trace elements.  Patient now taking PO meds; will take extra NaCl out of TPN and try oral salt tabs 2 g bid.  Continue 20% fat emulsion at 10 ml/hr.  Plan on weaning once tolerating full liquids  continue NS at [facility] rate.  Continue CBGs and resistant SSI q4h.  TPN lab panels on Mondays & Thursdays  BMP tomorrow  F/u daily.  Reuel Boom, PharmD, BCPS Pager: [phone] 09/16/2014, 10:02 AM

## 2014-09-16 NOTE — Progress Notes (Signed)
[facility] PROGRESS NOTE  [name] EXN:[id number] DOB: 1941/02/04 DOA: 08/22/2014 PCP: Kandice Hams, MD  BRIEF NARRATIVE 74 y.o. female with past medical history of dyslipidemia, hypertension, long standing ulcerative colitis and recently hospitalized from 08/07/2014 through 08/21/2014 for ulcerative colitis flare. She had flexible sigmoidoscopy 08/08/2014 which demonstrated inflammatory changes from the rectum consistent with ulcerative colitis. She presented to [facility] long hospital with perianal pain started the night prior to the admission. CT scan on the admission demonstrated a complex perirectal infection extending up toward the retroperitoneal area.  On 08/21/13 , patient underwent incision and drainage of the large issue rectal abscess on the right side. She was started on vancomycin and Zosyn. Postprocedure she was doing fine but has developed large amount of bleed on the morning of 08/23/2014. GI was consulted and pt was started on prednisone and mesalamine.  Hospital course has been complicated with ongoing rectal bleed so Additionally, she was found to have left LE DVT On venous doppler done 7/12.   On 7/20, pt underwent flex sigmoidoscopy with findings of significant Lt sided disease with GI recs for continued bowel rest, PO flagyl, increased steroids, and Remicade. After several days of bowel rest, pt's WBC rose and pt's steroids were tapered for infection to subside. Patient taken to or by surgery for laproscopic diverting ileostomy on 7/27.   Assessment/Plan: Sepsis secondary to perirectal abscess Incision and drainage by surgery on 08/23/2014. Blood cultures negative. Abscess cultures showed rare yeast and multiple organisms.  -Recommended by ID for a total 14 day duration of antibiotic (was continued on fluconazole and Flagyl) with stop date on 7/18 however due to stool in abscess cavity and possible fistula antibiotic has been prolonged. Zosyn added due to worsening WBC  and encephalopathy. Flagyl  discontinued.  -Diverting ileostomy on 7/27. 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 Ileostomy functioning well. Seen by wound care. -Continue clear liquid and advance as tolerated. Wean TPN. PT evaluation.   Rectal bleed secondary to ulcerative colitis with acute blood loss anemia -Patient started on prednisone and mesalamine which was unable to control rectal bleed and thus started on Remicade. Steroid  being tapered due to concern for persistent abscess. (Now on 10 mg Solu-Medrol daily) -Received a total of 3 u PRBC since admission. (Hemoglobin 12 on admission which has dropped to 7-8. Slowly improving. ) Leukocytosis persists. Still having  rectal bleed With stool from the fistula    Bilateral lower extremity DVT Not a candidate for anticoagulation due to ongoing rectal bleed and uncontrolled ulcerative colitis. IVC filter placed during this hospitalization.    Essential hypertension  Resumed home dose metoprolol. Continue when necessary hydralazine  Hypokalemia/hyponatremia Likely secondary to GI loss. Loree Fee being adjusted with TPN.  Check BMET in the morning and if still hyponatremic will add sodium tablets.  Generalized weakness PT evaluation pending. Eventually needs skilled nursing facility.  Protein calorie malnutrition Nutritionist  Following.   DVT prophylaxis: SCDs  Diet: clear Liquid. Weaning off TPN as diet advanced  Code Status: Full code Family Communication: None at bedside Disposition Plan:   SNF possibly early next week  Consultants:  CCS  GI    Procedures:  Flexible sigmoidoscopy on 7/20  Doppler lower extremity  IVC filter  PICC line  laproscopic diverting ileostomy on 7/27  Antibiotics:  Fluconazole 7/18--  Zosyn 7/25--  Flagyl 7/4-7/25  HPI/Subjective:  Patient seen and examined .  Abdominal pain better. Tolerating clears. Still having rectal bleed mixed with stool for perianal fistula.   Objective: Filed Vitals:   09/16/14 0425  BP: 130/60  Pulse: 79  Temp: 98.6 F (37 C)  Resp: 18    Intake/Output Summary (Last 24 hours) at 09/16/14 1221 Last data filed at 09/16/14 0814  Gross per 24 hour  Intake   2529 ml  Output   4725 ml  Net  -2196 ml   Filed Weights   08/23/14 0153 08/23/14 0434 09/10/14 5597  Weight: 102.3 kg (225 lb 8.5 oz) 104 kg (229 lb 4.5 oz) 107.3 kg (236 lb 8.9 oz)    Exam:   General:  NAD, fatigued  HEENT:  moist mucosa, supple neck   chest: clear b/l   CVS: NS1&S2, no murmurs  GI: soft, ileostomy bag with some stool, BS+, nontender.  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, lipid fistula with  blood mixed stool   Musculoskeletal: warm, no edema  Data Reviewed: Basic Metabolic Panel:  Recent Labs Lab 09/11/14 0333 09/12/14 0820 09/13/14 0420 09/14/14 0720 09/15/14 0427  NA 128* 126* 132* 130* 131*  K 4.5 4.7 4.5 4.8 4.3  CL 96* 94* 95* 97* 97*  CO2 29 29 30 30 31   GLUCOSE 182* 153* 123* 98 108*  BUN 30* 29* 25* 23* 25*  CREATININE 0.43* 0.46 0.47 0.55 0.49  CALCIUM 7.6* 7.4* 8.0* 7.8* 8.1*  MG 1.7  --   --  1.7  --   PHOS 3.4  --   --  4.0  --    Liver Function Tests:  Recent Labs Lab 09/11/14 0333 09/13/14 0420 09/14/14 0720  AST 28 30 35  ALT 27 25 23   ALKPHOS 82 87 86  BILITOT 0.5 0.5 0.7  PROT 4.8* 5.0* 4.8*  ALBUMIN 1.9* 2.0* 1.9*   No results for input(s): LIPASE, AMYLASE in the last 168 hours. No results for input(s): AMMONIA in the last 168 hours. CBC:  Recent Labs Lab 09/11/14 0333  09/12/14 0820 09/13/14 0420 09/14/14 0455 09/15/14 0427 09/16/14 0555  WBC 25.6*  --  23.7* 20.3* 18.2* 14.6* 17.0*  NEUTROABS 22.0*  --   --   --   --   --   --   HGB 7.4*  < > 7.4* 8.8* 8.7* 8.8* 9.5*  HCT 22.3*  < > 22.5* 27.3* 26.4* 26.8* 28.9*  MCV 91.8  --  94.1 91.9 95.0 95.7 94.8  PLT 293  --  240 235 311 218 247  <  > = values in this interval not displayed. Cardiac Enzymes: No results for input(s): CKTOTAL, CKMB, CKMBINDEX, TROPONINI in the last 168 hours. BNP (last 3 results) No results for input(s): BNP in the last 8760 hours.  ProBNP (last 3 results) No results for input(s): PROBNP in the last 8760 hours.  CBG:  Recent Labs Lab 09/15/14 1654 09/15/14 2004 09/15/14 2353 09/16/14 0418 09/16/14 0727  GLUCAP 148* 161* 134* 140* 109*    No results found for this or any previous visit (from the past 240 hour(s)).   Studies: No results found.  Scheduled Meds: . sodium chloride   Intravenous Once  . amLODipine  10 mg Oral Daily  . fluconazole (DIFLUCAN) IV  400 mg Intravenous Q24H  . insulin aspart  0-20 Units Subcutaneous Q4H  . lip balm  1 application Topical BID  . mesalamine  4.8 g Oral Q breakfast  . methylPREDNISolone (SOLU-MEDROL) injection  10 mg Intravenous  Q24H  . metoprolol tartrate  25 mg Oral BID  . nystatin   Topical BID  . piperacillin-tazobactam (ZOSYN)  IV  3.375 g Intravenous 3 times per day  . saccharomyces boulardii  250 mg Oral BID  . sodium chloride  10-40 mL Intracatheter Q12H  . sodium chloride  3 mL Intravenous Q12H  . sodium chloride  3 mL Intravenous Q12H  . sodium chloride  2 g Oral BID   Continuous Infusions: . sodium chloride 10 mL/hr at 09/13/14 1759  . Marland KitchenTPN (CLINIMIX-E) Adult 83 mL/hr at 09/15/14 1709   And  . fat emulsion 240 mL (09/15/14 1709)  . Marland KitchenTPN (CLINIMIX-E) Adult     And  . fat emulsion        Time spent: [place], [place]  [facility] Pager [phone] If 7PM-7AM, please contact night-coverage at [email or web address], password [facility] 09/16/2014, 12:21 PM  LOS: 25 days

## 2014-09-16 NOTE — Progress Notes (Signed)
Patient ID: [name], female   DOB: 10/25/1940, 74 y.o.   MRN: [id number] [facility] Progress Note:   3 Days Post-Op  Subjective: Mental status is unchanged from previously Objective: Vital signs in last 24 hours: Temp:  [98.2 F (36.8 C)-98.6 F (37 C)] 98.6 F (37 C) (07/30 0425) Pulse Rate:  [71-79] 79 (07/30 0425) Resp:  [18-20] 18 (07/30 0425) BP: (130-139)/(60-67) 130/60 mmHg (07/30 0425) SpO2:  [95 %-96 %] 95 % (07/30 0425)  Intake/Output from previous day: 07/29 0701 - 07/30 0700 In: 2889 [P.O.:600; IV Piggyback:150; TPN:2139] Out: 4625 [Urine:4500; Stool:125] Intake/Output this shift: Total I/O In: -  Out: 750 [Urine:750]  Physical Exam: Work of breathing is not labored.  Lab Results:  Results for orders placed or performed during the hospital encounter of 08/22/14 (from the past 48 hour(s))  Glucose, capillary     Status: Abnormal   Collection Time: 09/14/14 12:38 PM  Result Value Ref Range   Glucose-Capillary 169 (H) 65 - 99 mg/dL  Glucose, capillary     Status: Abnormal   Collection Time: 09/14/14  4:04 PM  Result Value Ref Range   Glucose-Capillary 141 (H) 65 - 99 mg/dL  Glucose, capillary     Status: Abnormal   Collection Time: 09/14/14  8:02 PM  Result Value Ref Range   Glucose-Capillary 167 (H) 65 - 99 mg/dL   Comment 1 Notify RN   Glucose, capillary     Status: Abnormal   Collection Time: 09/15/14 12:19 AM  Result Value Ref Range   Glucose-Capillary 130 (H) 65 - 99 mg/dL   Comment 1 Notify RN   Glucose, capillary     Status: Abnormal   Collection Time: 09/15/14  4:07 AM  Result Value Ref Range   Glucose-Capillary 108 (H) 65 - 99 mg/dL  Basic metabolic panel     Status: Abnormal   Collection Time: 09/15/14  4:27 AM  Result Value Ref Range   Sodium 131 (L) 135 - 145 mmol/L   Potassium 4.3 3.5 - 5.1 mmol/L   Chloride 97 (L) 101 - 111 mmol/L   CO2 31 22 - 32 mmol/L   Glucose, Bld 108 (H) 65 - 99 mg/dL   BUN 25 (H) 6 - 20 mg/dL    Creatinine, Ser 0.49 0.44 - 1.00 mg/dL   Calcium 8.1 (L) 8.9 - 10.3 mg/dL   GFR calc non Af Amer >60 >60 mL/min   GFR calc Af Amer >60 >60 mL/min    Comment: (NOTE) The eGFR has been calculated using the CKD EPI equation. This calculation has not been validated in all clinical situations. eGFR's persistently <60 mL/min signify possible Chronic Kidney Disease.    Anion gap 3 (L) 5 - 15  CBC     Status: Abnormal   Collection Time: 09/15/14  4:27 AM  Result Value Ref Range   WBC 14.6 (H) 4.0 - 10.5 K/uL   RBC 2.80 (L) 3.87 - 5.11 MIL/uL   Hemoglobin 8.8 (L) 12.0 - 15.0 g/dL   HCT 26.8 (L) 36.0 - 46.0 %   MCV 95.7 78.0 - 100.0 fL   MCH 31.4 26.0 - 34.0 pg   MCHC 32.8 30.0 - 36.0 g/dL   RDW 18.3 (H) 11.5 - 15.5 %   Platelets 218 150 - 400 K/uL  Glucose, capillary     Status: Abnormal   Collection Time: 09/15/14  7:36 AM  Result Value Ref Range   Glucose-Capillary 116 (H) 65 - 99 mg/dL  Glucose, capillary     Status: Abnormal   Collection Time: 09/15/14 11:23 AM  Result Value Ref Range   Glucose-Capillary 173 (H) 65 - 99 mg/dL  Glucose, capillary     Status: Abnormal   Collection Time: 09/15/14  4:54 PM  Result Value Ref Range   Glucose-Capillary 148 (H) 65 - 99 mg/dL  Glucose, capillary     Status: Abnormal   Collection Time: 09/15/14  8:04 PM  Result Value Ref Range   Glucose-Capillary 161 (H) 65 - 99 mg/dL  Glucose, capillary     Status: Abnormal   Collection Time: 09/15/14 11:53 PM  Result Value Ref Range   Glucose-Capillary 134 (H) 65 - 99 mg/dL  Glucose, capillary     Status: Abnormal   Collection Time: 09/16/14  4:18 AM  Result Value Ref Range   Glucose-Capillary 140 (H) 65 - 99 mg/dL  CBC     Status: Abnormal   Collection Time: 09/16/14  5:55 AM  Result Value Ref Range   WBC 17.0 (H) 4.0 - 10.5 K/uL   RBC 3.05 (L) 3.87 - 5.11 MIL/uL   Hemoglobin 9.5 (L) 12.0 - 15.0 g/dL   HCT 28.9 (L) 36.0 - 46.0 %   MCV 94.8 78.0 - 100.0 fL   MCH 31.1 26.0 - 34.0 pg   MCHC  32.9 30.0 - 36.0 g/dL   RDW 18.0 (H) 11.5 - 15.5 %   Platelets 247 150 - 400 K/uL  Glucose, capillary     Status: Abnormal   Collection Time: 09/16/14  7:27 AM  Result Value Ref Range   Glucose-Capillary 109 (H) 65 - 99 mg/dL   Comment 1 Notify RN     Radiology/Results: No results found.  Anti-infectives: Anti-infectives    Start     Dose/Rate Route Frequency Ordered Stop   09/11/14 1600  piperacillin-tazobactam (ZOSYN) IVPB 3.375 g     3.375 g 12.5 mL/hr over 240 Minutes Intravenous 3 times per day 09/11/14 1523     09/07/14 1200  fluconazole (DIFLUCAN) IVPB 400 mg     400 mg 100 mL/hr over 120 Minutes Intravenous Every 24 hours 09/06/14 2117     09/06/14 2200  metroNIDAZOLE (FLAGYL) IVPB 250 mg  Status:  Discontinued     250 mg 50 mL/hr over 60 Minutes Intravenous Every 8 hours 09/06/14 2117 09/12/14 1025   09/05/14 1500  metroNIDAZOLE (FLAGYL) tablet 250 mg  Status:  Discontinued     250 mg Oral 3 times per day 09/05/14 1440 09/06/14 2117   08/28/14 0000  fluconazole (DIFLUCAN) 200 MG tablet     400 mg Oral Daily 08/28/14 1052     08/28/14 0000  amoxicillin-clavulanate (AUGMENTIN) 875-125 MG per tablet     1 tablet Oral 2 times daily 08/28/14 1052     08/25/14 2000  vancomycin (VANCOCIN) IVPB 1000 mg/200 mL premix  Status:  Discontinued     1,000 mg 200 mL/hr over 60 Minutes Intravenous Every 12 hours 08/25/14 1009 08/26/14 0949   08/25/14 1200  fluconazole (DIFLUCAN) tablet 400 mg  Status:  Discontinued     400 mg Oral Daily 08/25/14 0943 09/06/14 2117   08/23/14 1000  vancomycin (VANCOCIN) IVPB 750 mg/150 ml premix  Status:  Discontinued     750 mg 150 mL/hr over 60 Minutes Intravenous Every 12 hours 08/22/14 2127 08/25/14 1009   08/23/14 0600  piperacillin-tazobactam (ZOSYN) IVPB 3.375 g  Status:  Discontinued     3.375 g 12.5 mL/hr over 240  Minutes Intravenous 3 times per day 08/22/14 2124 09/05/14 1356   08/22/14 2130  piperacillin-tazobactam (ZOSYN) IVPB 3.375 g      3.375 g 100 mL/hr over 30 Minutes Intravenous NOW 08/22/14 2124 08/22/14 2220   08/22/14 2130  [MAR Hold]  vancomycin (VANCOCIN) 2,000 mg in sodium chloride 0.9 % 500 mL IVPB     (MAR Hold since 08/22/14 2338)   2,000 mg 250 mL/hr over 120 Minutes Intravenous NOW 08/22/14 2127 08/23/14 0008      Assessment/Plan: Problem List: Patient Active Problem List   Diagnosis Date Noted  . Fasciitis   . Cold   . DVT (deep vein thrombosis) in pregnancy   . DVT (deep venous thrombosis)   . Ulcerative colitis   . Abscess, perirectal s/p I&D 08/23/2014 08/27/2014  . Sepsis 08/26/2014  . Infection due to yeast 08/26/2014  . Acute blood loss anemia 08/26/2014  . Leukocytosis 08/26/2014  . Hypokalemia 08/26/2014  . Hyponatremia 08/26/2014  . General weakness 08/26/2014  . Thrombocytopenia 08/26/2014  . Essential hypertension 07/11/2008  . 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  Pain with repacking.   3 Days Post-Op    LOS: 25 days   Matt B. Hassell Done, MD, [facility] [phone] beeper [phone]  09/16/2014 10:59 AM

## 2014-09-17 DIAGNOSIS — K5190 Ulcerative colitis, unspecified, without complications: Secondary | ICD-10-CM

## 2014-09-17 DIAGNOSIS — K612 Anorectal abscess: Secondary | ICD-10-CM

## 2014-09-17 LAB — BASIC METABOLIC PANEL
Anion gap: 7 (ref 5–15)
BUN: 23 mg/dL — ABNORMAL HIGH (ref 6–20)
CHLORIDE: 95 mmol/L — AB (ref 101–111)
CO2: 29 mmol/L (ref 22–32)
Calcium: 8.2 mg/dL — ABNORMAL LOW (ref 8.9–10.3)
Creatinine, Ser: 0.38 mg/dL — ABNORMAL LOW (ref 0.44–1.00)
GFR calc non Af Amer: >60 mL/min (ref >60)
Glucose, Bld: 117 mg/dL — ABNORMAL HIGH (ref 65–99)
POTASSIUM: 4.1 mmol/L (ref 3.5–5.1)
Sodium: 131 mmol/L — ABNORMAL LOW (ref 135–145)

## 2014-09-17 LAB — GLUCOSE, CAPILLARY
GLUCOSE-CAPILLARY: 109 mg/dL — AB (ref 65–99)
GLUCOSE-CAPILLARY: 128 mg/dL — AB (ref 65–99)
GLUCOSE-CAPILLARY: 129 mg/dL — AB (ref 65–99)
Glucose-Capillary: 109 mg/dL — ABNORMAL HIGH (ref 65–99)
Glucose-Capillary: 124 mg/dL — ABNORMAL HIGH (ref 65–99)
Glucose-Capillary: 129 mg/dL — ABNORMAL HIGH (ref 65–99)
Glucose-Capillary: 89 mg/dL (ref 65–99)

## 2014-09-17 MED ORDER — ALPRAZOLAM 0.5 MG PO TABS
0.5000 mg | ORAL_TABLET | Freq: Two times a day (BID) | ORAL | Status: DC | PRN
  Administered 2014-09-17 – 2014-09-18 (×2): 0.5 mg via ORAL
  Filled 2014-09-17 (×2): qty 1

## 2014-09-17 MED ORDER — FAT EMULSION 20 % IV EMUL
240.0000 mL | INTRAVENOUS | Status: AC
  Administered 2014-09-17: 240 mL via INTRAVENOUS
  Filled 2014-09-17: qty 250

## 2014-09-17 MED ORDER — ZOLPIDEM TARTRATE 5 MG PO TABS
5.0000 mg | ORAL_TABLET | Freq: Every day | ORAL | Status: DC
  Administered 2014-09-18 – 2014-09-20 (×3): 5 mg via ORAL
  Filled 2014-09-17 (×3): qty 1

## 2014-09-17 MED ORDER — TRACE MINERALS CR-CU-MN-SE-ZN 10-1000-500-60 MCG/ML IV SOLN
INTRAVENOUS | Status: AC
  Administered 2014-09-17: 18:00:00 via INTRAVENOUS
  Filled 2014-09-17: qty 1992

## 2014-09-17 NOTE — Progress Notes (Signed)
ANTIBIOTIC CONSULT NOTE - follow up  Pharmacy Consult for Zosyn Indication: Peri-rectal abscess  Allergies  Allergen Reactions  . Clindamycin/Lincomycin Diarrhea    Leads to colitis flare  . Ivp Dye [Iodinated Diagnostic Agents] Other (See Comments)    "almost passed out"    Patient Measurements: Height: 5\' 9"  (175.3 cm) Weight:  (RN is notified about the weight stituation for pt.) IBW/kg (Calculated) : 66.2  Vital Signs: Temp: 98.4 F (36.9 C) (07/31 0700) Temp Source: Oral (07/31 0700) BP: 119/43 mmHg (07/31 0700) Pulse Rate: 74 (07/31 0700) Intake/Output from previous day: 07/30 0701 - 07/31 0700 In: 1356 [I.V.:240; TPN:1116] Out: 3700 [Urine:2850; Stool:850] Intake/Output from this shift:    Labs:  Recent Labs  09/15/14 0427 09/16/14 0555 09/17/14 0445  WBC 14.6* 17.0*  --   HGB 8.8* 9.5*  --   PLT 218 247  --   CREATININE 0.49  --  0.38*   Estimated Creatinine Clearance: 78 mL/min (by C-G formula based on Cr of 0.38). No results for input(s): VANCOTROUGH, VANCOPEAK, VANCORANDOM, GENTTROUGH, GENTPEAK, GENTRANDOM, TOBRATROUGH, TOBRAPEAK, TOBRARND, AMIKACINPEAK, AMIKACINTROU, AMIKACIN in the last 72 hours.   Microbiology: Recent Results (from the past 720 hour(s))  Culture, blood (routine x 2)     Status: None   Collection Time: 08/22/14  9:30 PM  Result Value Ref Range Status   Specimen Description BLOOD LEFT HAND  Final   Special Requests BOTTLES DRAWN AEROBIC ONLY 5CC  Final   Culture   Final    NO GROWTH 5 DAYS Performed at [facility]    Report Status 08/27/2014 FINAL  Final  Anaerobic culture     Status: None   Collection Time: 08/23/14 12:03 AM  Result Value Ref Range Status   Specimen Description ABSCESS PERIRECTAL  Final   Special Requests PATIENT ON FOLLOWING ZOYSN, VANC  Final   Gram Stain   Final    NO WBC SEEN NO SQUAMOUS EPITHELIAL CELLS SEEN MODERATE GRAM NEGATIVE RODS FEW YEAST RARE GRAM POSITIVE COCCI    Culture   Final     NO ANAEROBES ISOLATED Performed at [facility]    Report Status 08/27/2014 FINAL  Final  Culture, routine-abscess     Status: None   Collection Time: 08/23/14 12:03 AM  Result Value Ref Range Status   Specimen Description ABSCESS PERIRECTAL  Final   Special Requests PATIENT ON FOLLOWING ZOYSN, VANC  Final   Gram Stain   Final    NO WBC SEEN NO SQUAMOUS EPITHELIAL CELLS SEEN MODERATE GRAM NEGATIVE RODS RARE YEAST RARE GRAM POSITIVE COCCI    Culture   Final    MULTIPLE ORGANISMS PRESENT, NONE PREDOMINANT Note: NO STAPHYLOCOCCUS AUREUS ISOLATED NO GROUP A STREP (S.PYOGENES) ISOLATED Performed at [facility]    Report Status 08/26/2014 FINAL  Final  MRSA PCR Screening     Status: None   Collection Time: 08/23/14  1:32 AM  Result Value Ref Range Status   MRSA by PCR NEGATIVE NEGATIVE Final    Comment:        The GeneXpert MRSA Assay (FDA approved for NASAL specimens only), is one component of a comprehensive MRSA colonization surveillance program. It is not intended to diagnose MRSA infection nor to guide or monitor treatment for MRSA infections.   Culture, blood (routine x 2)     Status: None   Collection Time: 08/23/14  4:55 AM  Result Value Ref Range Status   Specimen Description BLOOD LEFT HAND  Final  Special Requests BOTTLES DRAWN AEROBIC ONLY 5ML  Final   Culture   Final    NO GROWTH 5 DAYS Performed at [facility]    Report Status 08/28/2014 FINAL  Final  Clostridium Difficile by PCR (not at [facility])     Status: None   Collection Time: 08/28/14  7:11 PM  Result Value Ref Range Status   C difficile by pcr NEGATIVE NEGATIVE Final    Medical History: Past Medical History  Diagnosis Date  . CAD (coronary artery disease)     unspecified  . Cerebrovascular disease   . Hyperlipidemia, mixed   . HTN (hypertension)   . Ulcerative colitis   . Diverticulitis   . Colon polyps 2006  . Thyroid disease   . Myocardial infarct   .  Carotid artery occlusion 2010  . Diabetes mellitus without complication     Borderline  . GERD (gastroesophageal reflux disease)   . Arthritis     Assessment: 1 y/oF with PMH of dyslipidemia, HTN, long standing ulcerative colitis and recently hospitalized from 6/20-7/4 for ulcerative colitis flare. She presented to [facility] 7/5 for readmission d/t perianal pain. S/p I&D on 7/5 of peri-rectal abscess on the right side. Hospital course complicated by ongoing rectal bleed and LE DVT. Patient underwent flex sig on 7/20 with findings of severe colitis. Consideration for possible total colectomy and ileostomy if no significant improvement with medical management.  Current antibiotics include Diflucan and Flagyl.  Pharmacy now consulted to dose Zosyn, as WBC significantly elevated and patient more encephalopathic per MD.  Temp: afebrile > 7d WBC: elevated but trending down; last checked 7/30 Renal: SCr low; CrCl 78 CG using SCr 0.8  7/5 >> Vancomycin >> 7/9 7/5 >> Zosyn >>  7/19 7/8 >> Diflucan (MD) >> 7/19 >> Flagyl (MD) >> 7/26 7/25 >> Zosyn >>  7/5 blood: NGF 7/6 blood: NGF 7/6 abscess:  multiple organisms present, none predominant. Rare yeast. No Staph aureus or group A strep isolated. No anaerobes isolated. 7/6 MRSA PCR: negative 7/11 C.diff PCR: negative  Dose changes/levels: 7/8 0900 VT: 13 on 750mg  q12h, incr to 1g q12h  Goal of Therapy:  Appropriate antibiotic dosing for renal function and indication Eradication of infection  Plan:  Continue Zosyn 3.375g IV q8h (infuse over 4 hours) F/u LOT; may need additional ID input Pharmacy will sign off formal note-writing, but will follow peripherally for clinical course and renal adjustment   Reuel Boom, PharmD, BCPS Pager: [phone] 09/17/2014, 10:32 AM

## 2014-09-17 NOTE — Progress Notes (Signed)
Patient ID: [name], female   DOB: 12/13/1940, 74 y.o.   MRN: [id number] [facility] Progress Note:   4 Days Post-Op  Subjective: Mental status is clear Objective: Vital signs in last 24 hours: Temp:  [97.8 F (36.6 C)-98.4 F (36.9 C)] 98.4 F (36.9 C) (07/31 0700) Pulse Rate:  [62-74] 74 (07/31 0700) Resp:  [18] 18 (07/31 0700) BP: (116-126)/(43-59) 119/43 mmHg (07/31 0700) SpO2:  [97 %-100 %] 97 % (07/31 0700)  Intake/Output from previous day: 07/30 0701 - 07/31 0700 In: 1356 [I.V.:240; TPN:1116] Out: 3700 [Urine:2850; Stool:850] Intake/Output this shift: Total I/O In: 280 [P.O.:280] Out: -   Physical Exam: Work of breathing is normal;  Much more comfortable today.  Ostomy functioning nicely  Lab Results:  Results for orders placed or performed during the hospital encounter of 08/22/14 (from the past 48 hour(s))  Glucose, capillary     Status: Abnormal   Collection Time: 09/15/14 11:23 AM  Result Value Ref Range   Glucose-Capillary 173 (H) 65 - 99 mg/dL  Glucose, capillary     Status: Abnormal   Collection Time: 09/15/14  4:54 PM  Result Value Ref Range   Glucose-Capillary 148 (H) 65 - 99 mg/dL  Glucose, capillary     Status: Abnormal   Collection Time: 09/15/14  8:04 PM  Result Value Ref Range   Glucose-Capillary 161 (H) 65 - 99 mg/dL  Glucose, capillary     Status: Abnormal   Collection Time: 09/15/14 11:53 PM  Result Value Ref Range   Glucose-Capillary 134 (H) 65 - 99 mg/dL  Glucose, capillary     Status: Abnormal   Collection Time: 09/16/14  4:18 AM  Result Value Ref Range   Glucose-Capillary 140 (H) 65 - 99 mg/dL  CBC     Status: Abnormal   Collection Time: 09/16/14  5:55 AM  Result Value Ref Range   WBC 17.0 (H) 4.0 - 10.5 K/uL   RBC 3.05 (L) 3.87 - 5.11 MIL/uL   Hemoglobin 9.5 (L) 12.0 - 15.0 g/dL   HCT 28.9 (L) 36.0 - 46.0 %   MCV 94.8 78.0 - 100.0 fL   MCH 31.1 26.0 - 34.0 pg   MCHC 32.9 30.0 - 36.0 g/dL   RDW 18.0 (H) 11.5 - 15.5  %   Platelets 247 150 - 400 K/uL  Glucose, capillary     Status: Abnormal   Collection Time: 09/16/14  7:27 AM  Result Value Ref Range   Glucose-Capillary 109 (H) 65 - 99 mg/dL   Comment 1 Notify RN   Glucose, capillary     Status: Abnormal   Collection Time: 09/16/14 11:43 AM  Result Value Ref Range   Glucose-Capillary 133 (H) 65 - 99 mg/dL   Comment 1 Notify RN   Glucose, capillary     Status: Abnormal   Collection Time: 09/16/14  4:47 PM  Result Value Ref Range   Glucose-Capillary 129 (H) 65 - 99 mg/dL   Comment 1 Notify RN   Glucose, capillary     Status: Abnormal   Collection Time: 09/16/14  9:08 PM  Result Value Ref Range   Glucose-Capillary 112 (H) 65 - 99 mg/dL  Glucose, capillary     Status: Abnormal   Collection Time: 09/17/14 12:12 AM  Result Value Ref Range   Glucose-Capillary 109 (H) 65 - 99 mg/dL   Comment 1 Notify RN   Glucose, capillary     Status: Abnormal   Collection Time: 09/17/14  4:14 AM  Result Value Ref Range   Glucose-Capillary 124 (H) 65 - 99 mg/dL  Basic metabolic panel     Status: Abnormal   Collection Time: 09/17/14  4:45 AM  Result Value Ref Range   Sodium 131 (L) 135 - 145 mmol/L   Potassium 4.1 3.5 - 5.1 mmol/L   Chloride 95 (L) 101 - 111 mmol/L   CO2 29 22 - 32 mmol/L   Glucose, Bld 117 (H) 65 - 99 mg/dL   BUN 23 (H) 6 - 20 mg/dL   Creatinine, Ser 0.38 (L) 0.44 - 1.00 mg/dL   Calcium 8.2 (L) 8.9 - 10.3 mg/dL   GFR calc non Af Amer >60 >60 mL/min   GFR calc Af Amer >60 >60 mL/min    Comment: (NOTE) The eGFR has been calculated using the CKD EPI equation. This calculation has not been validated in all clinical situations. eGFR's persistently <60 mL/min signify possible Chronic Kidney Disease.    Anion gap 7 5 - 15  Glucose, capillary     Status: None   Collection Time: 09/17/14  7:48 AM  Result Value Ref Range   Glucose-Capillary 89 65 - 99 mg/dL   Comment 1 Notify RN     Radiology/Results: No results  found.  Anti-infectives: Anti-infectives    Start     Dose/Rate Route Frequency Ordered Stop   09/11/14 1600  piperacillin-tazobactam (ZOSYN) IVPB 3.375 g     3.375 g 12.5 mL/hr over 240 Minutes Intravenous 3 times per day 09/11/14 1523     09/07/14 1200  fluconazole (DIFLUCAN) IVPB 400 mg     400 mg 100 mL/hr over 120 Minutes Intravenous Every 24 hours 09/06/14 2117     09/06/14 2200  metroNIDAZOLE (FLAGYL) IVPB 250 mg  Status:  Discontinued     250 mg 50 mL/hr over 60 Minutes Intravenous Every 8 hours 09/06/14 2117 09/12/14 1025   09/05/14 1500  metroNIDAZOLE (FLAGYL) tablet 250 mg  Status:  Discontinued     250 mg Oral 3 times per day 09/05/14 1440 09/06/14 2117   08/28/14 0000  fluconazole (DIFLUCAN) 200 MG tablet     400 mg Oral Daily 08/28/14 1052     08/28/14 0000  amoxicillin-clavulanate (AUGMENTIN) 875-125 MG per tablet     1 tablet Oral 2 times daily 08/28/14 1052     08/25/14 2000  vancomycin (VANCOCIN) IVPB 1000 mg/200 mL premix  Status:  Discontinued     1,000 mg 200 mL/hr over 60 Minutes Intravenous Every 12 hours 08/25/14 1009 08/26/14 0949   08/25/14 1200  fluconazole (DIFLUCAN) tablet 400 mg  Status:  Discontinued     400 mg Oral Daily 08/25/14 0943 09/06/14 2117   08/23/14 1000  vancomycin (VANCOCIN) IVPB 750 mg/150 ml premix  Status:  Discontinued     750 mg 150 mL/hr over 60 Minutes Intravenous Every 12 hours 08/22/14 2127 08/25/14 1009   08/23/14 0600  piperacillin-tazobactam (ZOSYN) IVPB 3.375 g  Status:  Discontinued     3.375 g 12.5 mL/hr over 240 Minutes Intravenous 3 times per day 08/22/14 2124 09/05/14 1356   08/22/14 2130  piperacillin-tazobactam (ZOSYN) IVPB 3.375 g     3.375 g 100 mL/hr over 30 Minutes Intravenous NOW 08/22/14 2124 08/22/14 2220   08/22/14 2130  [MAR Hold]  vancomycin (VANCOCIN) 2,000 mg in sodium chloride 0.9 % 500 mL IVPB     (MAR Hold since 08/22/14 2338)   2,000 mg 250 mL/hr over 120 Minutes Intravenous NOW 08/22/14 2127 08/23/14  0008      Assessment/Plan: Problem List: Patient Active Problem List   Diagnosis Date Noted  . Fasciitis   . Cold   . DVT (deep vein thrombosis) in pregnancy   . DVT (deep venous thrombosis)   . Ulcerative colitis   . Abscess, perirectal s/p I&D 08/23/2014 08/27/2014  . Sepsis 08/26/2014  . Infection due to yeast 08/26/2014  . Acute blood loss anemia 08/26/2014  . Leukocytosis 08/26/2014  . Hypokalemia 08/26/2014  . Hyponatremia 08/26/2014  . General weakness 08/26/2014  . Thrombocytopenia 08/26/2014  . Essential hypertension 07/11/2008  . 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   4 Days Post-Op    LOS: 26 days   Matt B. Hassell Done, MD, [facility] [phone] beeper [phone]  09/17/2014 11:01 AM

## 2014-09-17 NOTE — Progress Notes (Signed)
[facility] NOTE  Pharmacy Consult for TPN Indication: severe IBD with fistula and abscess  Allergies  Allergen Reactions  . Clindamycin/Lincomycin Diarrhea    Leads to colitis flare  . Ivp Dye [Iodinated Diagnostic Agents] Other (See Comments)    "almost passed out"    Patient Measurements: Height: 5\' 9"  (175.3 cm) Weight:  (RN is notified about the weight stituation for pt.) IBW/kg (Calculated) : 66.2 Adjusted Body Weight: 81.3kg  Vital Signs: Temp: 98.4 F (36.9 C) (07/31 0700) Temp Source: Oral (07/31 0700) BP: 119/43 mmHg (07/31 0700) Pulse Rate: 74 (07/31 0700) Intake/Output from previous day: 07/30 0701 - 07/31 0700 In: 1356 [I.V.:240; TPN:1116] Out: 3700 [Urine:2850; Stool:850] Intake/Output from this shift:    Labs:  Recent Labs  09/15/14 0427 09/16/14 0555  WBC 14.6* 17.0*  HGB 8.8* 9.5*  HCT 26.8* 28.9*  PLT 218 247     Recent Labs  09/15/14 0427 09/17/14 0445  NA 131* 131*  K 4.3 4.1  CL 97* 95*  CO2 31 29  GLUCOSE 108* 117*  BUN 25* 23*  CREATININE 0.49 0.38*  CALCIUM 8.1* 8.2*   Estimated Creatinine Clearance: 78 mL/min (by C-G formula based on Cr of 0.38).    Recent Labs  09/17/14 0012 09/17/14 0414 09/17/14 0748  GLUCAP 109* 124* 89     Medications: Continuous Infusions: . sodium chloride 10 mL/hr at 09/13/14 1759  . Marland KitchenTPN (CLINIMIX-E) Adult 83 mL/hr at 09/16/14 1747   And  . fat emulsion 240 mL (09/16/14 1747)   Insulin Requirements:  Resistant SSI: 9 units yesterday Insulin in TPN: 45 units/24 hrs   Current Nutrition:  Diet: full liquids TPN @ 83 ml/hr, Lipids @ 10 ml/hr  IVF: NS @ KVO   Central access: PICC placed 08/30/14 TPN start date:  7/20  ASSESSMENT                                                                                                          HPI:  74 y.o. female with past medical history of dyslipidemia, hypertension, long standing ulcerative colitis and recently hospitalized  from 08/07/2014 through 08/21/2014 for ulcerative colitis flare.  She presented to [facility] 7/5 for readmission d/t perianal pain.  S/p I&D on 08/21/13 of the large issue rectal abscess on the right side.  Further surgery (colectomy vs colostomy) postponed due to severe colitis.  Pharmacy consulted to start TPN for severe IBD with fistula and abscess.  Significant events:  7/5 I&D of complex perirectal abscess 7/20 flex sig: severe left sided colitis consistent with Crohn's colitis; flexi-seal removed.  TPN started. 7/22: TPN titrated to goal rate 7/27: s/p laparoscopic diverting ileostomy (TPN off for ~ 9 hours) 7/29: diet advanced to full liquid, but returned to CLD in afternoon. 7/30: Minimal intake overnight d/t abd pain.   7/31: attempting advance to fulls again   Today:   Glucose (goal CBGs <150):  no history of diabetes, remains on once daily Solumedrol, CBGs improved (range 109-129). Insulin added to TPN on 7/22.  Electrolytes:  Na, Cl remain low despite additional supplementation since 7/26. All others WNL including Corrected Ca.  Last Mag/Phos stable wnl on 7/28  Renal:  SCr low, CrCl 78 ml/min  LFTs:  WNL, alb low  TGs: 128 (7/21), 109 (7/25) - stable  Prealbumin: 19.6 (7/21), 26.9 (7/25) - rising, possibly skewed by steroids  NUTRITIONAL GOALS                                                                                             RD recs (7/26): 1900-2100 Kcal/day, 100-110 g protein / day Clinimix E 5/15 at a goal rate of 83 ml/hr + 20% fat emulsion at 65ml/hr to provide: 100 g/day protein, 1894 Kcal/day.   PLAN                                                                                                                         At 1800 today:  Continue Clinimix E 5/15 at goal rate of 83 ml/hr.  No note from surgery today but per prior notes, to begin weaning once tolerating fulls.  If does well today, consider weaning tomorrow.  Will decrease insulin  to 35 units/24 hr as CBGs are trending down.  Without Hx DM, I doubt advancing diet to full liquids will affect CBGs as much as TPN  TPN to contain standard multivitamins and trace elements.  Continue NaCl tabs 2g PO bid; can increase to TID if Na starts to drop again  Continue 20% fat emulsion at 10 ml/hr.  continue NS at [facility] rate.  Continue CBGs and resistant SSI q4h.  TPN lab panels on Mondays & Thursdays  F/u daily.  Reuel Boom, PharmD, BCPS Pager: [phone] 09/17/2014, 10:07 AM

## 2014-09-17 NOTE — Progress Notes (Signed)
[facility] PROGRESS NOTE  [name] SNK:[id number] DOB: 10-17-1940 DOA: 08/22/2014 PCP: Kandice Hams, MD  BRIEF NARRATIVE 74 y.o. female with past medical history of dyslipidemia, hypertension, long standing ulcerative colitis and recently hospitalized from 08/07/2014 through 08/21/2014 for ulcerative colitis flare. She had flexible sigmoidoscopy 08/08/2014 which demonstrated inflammatory changes from the rectum consistent with ulcerative colitis. She presented to [facility] long hospital with perianal pain started the night prior to the admission. CT scan on the admission demonstrated a complex perirectal infection extending up toward the retroperitoneal area.  On 08/21/13 , patient underwent incision and drainage of the large issue rectal abscess on the right side. She was started on vancomycin and Zosyn. Postprocedure she was doing fine but has developed large amount of bleed on the morning of 08/23/2014. GI was consulted and pt was started on prednisone and mesalamine.  Hospital course has been complicated with ongoing rectal bleed so Additionally, she was found to have left LE DVT On venous doppler done 7/12.   On 7/20, pt underwent flex sigmoidoscopy with findings of significant Lt sided disease with GI recs for continued bowel rest, PO flagyl, increased steroids, and Remicade. After several days of bowel rest, pt's WBC rose and pt's steroids were tapered for infection to subside. Patient taken to OR by surgery for laproscopic diverting ileostomy on 7/27.   Assessment/Plan: Sepsis secondary to perirectal abscess Incision and drainage by surgery on 08/23/2014. Blood cultures negative. Abscess cultures showed rare yeast and multiple organisms.  -Recommended by ID for a total 14 day duration of antibiotic (was continued on fluconazole and Flagyl) with stop date on 7/18,  however due to stool in abscess cavity and possible fistula antibiotic has been prolonged. Zosyn added due to worsening  WBC and encephalopathy. Flagyl  discontinued.  -Diverting ileostomy on 7/27. 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 Ileostomy functioning well.  wound care following. -Advance diet to full liquid. Wean TPN. PT evaluation.   Rectal bleed secondary to ulcerative colitis with acute blood loss anemia -Patient started on prednisone and mesalamine which was unable to control rectal bleed and thus started on Remicade. Steroid  being tapered due to concern for persistent abscess. (Now on 10 mg Solu-Medrol daily) -Received a total of 3 u PRBC since admission. (Hemoglobin 12 on admission which has dropped to 7-8. Slowly improving. ) Leukocytosis persists. Still having  rectal bleed With stool from the fistula. -Repeat CBC in the a.m.    Bilateral lower extremity DVT Not a candidate for anticoagulation due to ongoing rectal bleed and uncontrolled ulcerative colitis. IVC filter placed during this hospitalization.    Essential hypertension  Resumed home dose metoprolol. Continue when necessary hydralazine  Hypokalemia/hyponatremia Likely secondary to GI loss. Loree Fee being adjusted with TPN.  Still has mild hyponatremia. Add salt tablets.   Generalized weakness PT evaluation pending. Eventually needs skilled nursing facility.  Anxiety and insomnia Patient anxious with prolonged hospital stay and has difficulty sleeping. Added low dose bid  Klonopin PRN and bedtime Ambien.  Protein calorie malnutrition Nutritionist  Following.   DVT prophylaxis: SCDs  Diet: Full Liquid. Weaning off TPN as diet advanced  Code Status: Full code Family Communication: None at bedside. Spoke with Dr Donnald Garre on the phone. Disposition Plan:   SNF possibly sometime this week  Consultants:  CCS  GI    Procedures:  Flexible sigmoidoscopy on  7/20  Doppler lower extremity  IVC  filter  PICC line  laproscopic diverting ileostomy on 7/27  Antibiotics:  Fluconazole 7/18--  Zosyn 7/25--  Flagyl 7/4-7/25  HPI/Subjective: Patient seen and examined . Denies abdominal pain today. Tolerating clears.  feeling anxious and difficulty falling asleep.  Objective: Filed Vitals:   09/17/14 0700  BP: 119/43  Pulse: 74  Temp: 98.4 F (36.9 C)  Resp: 18    Intake/Output Summary (Last 24 hours) at 09/17/14 1147 Last data filed at 09/17/14 1041  Gross per 24 hour  Intake   1636 ml  Output   2950 ml  Net  -1314 ml   Filed Weights   08/23/14 0153 08/23/14 0434 09/10/14 6333  Weight: 102.3 kg (225 lb 8.5 oz) 104 kg (229 lb 4.5 oz) 107.3 kg (236 lb 8.9 oz)    Exam:   General:  NAD, fatigued  HEENT:  moist mucosa, supple neck   chest: clear b/l   CVS: NS1&S2, no murmurs  GI: soft, ileostomy bag with some stool, BS+, nontender.  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, rectal fistula with  blood mixed stool   Musculoskeletal: warm, no edema  Data Reviewed: Basic Metabolic Panel:  Recent Labs Lab 09/11/14 0333 09/12/14 0820 09/13/14 0420 09/14/14 0720 09/15/14 0427 09/17/14 0445  NA 128* 126* 132* 130* 131* 131*  K 4.5 4.7 4.5 4.8 4.3 4.1  CL 96* 94* 95* 97* 97* 95*  CO2 29 29 30 30 31 29   GLUCOSE 182* 153* 123* 98 108* 117*  BUN 30* 29* 25* 23* 25* 23*  CREATININE 0.43* 0.46 0.47 0.55 0.49 0.38*  CALCIUM 7.6* 7.4* 8.0* 7.8* 8.1* 8.2*  MG 1.7  --   --  1.7  --   --   PHOS 3.4  --   --  4.0  --   --    Liver Function Tests:  Recent Labs Lab 09/11/14 0333 09/13/14 0420 09/14/14 0720  AST 28 30 35  ALT 27 25 23   ALKPHOS 82 87 86  BILITOT 0.5 0.5 0.7  PROT 4.8* 5.0* 4.8*  ALBUMIN 1.9* 2.0* 1.9*   No results for input(s): LIPASE, AMYLASE in the last 168 hours. No results for input(s): AMMONIA in the last 168 hours. CBC:  Recent Labs Lab 09/11/14 0333  09/12/14 0820 09/13/14 0420 09/14/14 0455 09/15/14 0427 09/16/14 0555  WBC 25.6*  --  23.7* 20.3*  18.2* 14.6* 17.0*  NEUTROABS 22.0*  --   --   --   --   --   --   HGB 7.4*  < > 7.4* 8.8* 8.7* 8.8* 9.5*  HCT 22.3*  < > 22.5* 27.3* 26.4* 26.8* 28.9*  MCV 91.8  --  94.1 91.9 95.0 95.7 94.8  PLT 293  --  240 235 311 218 247  < > = values in this interval not displayed. Cardiac Enzymes: No results for input(s): CKTOTAL, CKMB, CKMBINDEX, TROPONINI in the last 168 hours. BNP (last 3 results) No results for input(s): BNP in the last 8760 hours.  ProBNP (last 3 results) No results for input(s): PROBNP in the last 8760 hours.  CBG:  Recent Labs Lab 09/16/14 1647 09/16/14 2108 09/17/14 0012 09/17/14 0414 09/17/14 0748  GLUCAP 129* 112* 109* 124* 89    No results found for this or any previous visit (from the past 240 hour(s)).   Studies: No results found.  Scheduled Meds: . sodium chloride   Intravenous Once  . amLODipine  10 mg Oral Daily  . fluconazole (  DIFLUCAN) IV  400 mg Intravenous Q24H  . insulin aspart  0-20 Units Subcutaneous Q4H  . lip balm  1 application Topical BID  . mesalamine  4.8 g Oral Q breakfast  . methylPREDNISolone (SOLU-MEDROL) injection  10 mg Intravenous Q24H  . metoprolol tartrate  25 mg Oral BID  . nystatin   Topical BID  . piperacillin-tazobactam (ZOSYN)  IV  3.375 g Intravenous 3 times per day  . saccharomyces boulardii  250 mg Oral BID  . sodium chloride  10-40 mL Intracatheter Q12H  . sodium chloride  3 mL Intravenous Q12H  . sodium chloride  3 mL Intravenous Q12H  . sodium chloride  2 g Oral BID  . zolpidem  5 mg Oral QHS   Continuous Infusions: . sodium chloride 10 mL/hr at 09/13/14 1759  . Marland KitchenTPN (CLINIMIX-E) Adult 83 mL/hr at 09/16/14 1747   And  . fat emulsion 240 mL (09/16/14 1747)  . Marland KitchenTPN (CLINIMIX-E) Adult     And  . fat emulsion        Time spent: [place], [place]  [facility] Pager [phone] If 7PM-7AM, please contact night-coverage at [email or web address], password [facility] 09/17/2014, 11:47 AM  LOS: 26 days

## 2014-09-18 LAB — CBC
HCT: 27 % — ABNORMAL LOW (ref 36.0–46.0)
HEMOGLOBIN: 8.7 g/dL — AB (ref 12.0–15.0)
MCH: 30.6 pg (ref 26.0–34.0)
MCHC: 32.2 g/dL (ref 30.0–36.0)
MCV: 95.1 fL (ref 78.0–100.0)
Platelets: 236 10*3/uL (ref 150–400)
RBC: 2.84 MIL/uL — ABNORMAL LOW (ref 3.87–5.11)
RDW: 18 % — ABNORMAL HIGH (ref 11.5–15.5)
WBC: 13.2 10*3/uL — ABNORMAL HIGH (ref 4.0–10.5)

## 2014-09-18 LAB — COMPREHENSIVE METABOLIC PANEL
ALT: 27 U/L (ref 14–54)
AST: 24 U/L (ref 15–41)
Albumin: 2 g/dL — ABNORMAL LOW (ref 3.5–5.0)
Alkaline Phosphatase: 78 U/L (ref 38–126)
Anion gap: 6 (ref 5–15)
BILIRUBIN TOTAL: 0.3 mg/dL (ref 0.3–1.2)
BUN: 20 mg/dL (ref 6–20)
CALCIUM: 8.1 mg/dL — AB (ref 8.9–10.3)
CHLORIDE: 96 mmol/L — AB (ref 101–111)
CO2: 28 mmol/L (ref 22–32)
CREATININE: 0.52 mg/dL (ref 0.44–1.00)
GFR calc Af Amer: >60 mL/min (ref >60)
Glucose, Bld: 110 mg/dL — ABNORMAL HIGH (ref 65–99)
Potassium: 4 mmol/L (ref 3.5–5.1)
SODIUM: 130 mmol/L — AB (ref 135–145)
TOTAL PROTEIN: 5 g/dL — AB (ref 6.5–8.1)

## 2014-09-18 LAB — GLUCOSE, CAPILLARY
GLUCOSE-CAPILLARY: 115 mg/dL — AB (ref 65–99)
GLUCOSE-CAPILLARY: 148 mg/dL — AB (ref 65–99)
Glucose-Capillary: 121 mg/dL — ABNORMAL HIGH (ref 65–99)
Glucose-Capillary: 147 mg/dL — ABNORMAL HIGH (ref 65–99)
Glucose-Capillary: 181 mg/dL — ABNORMAL HIGH (ref 65–99)
Glucose-Capillary: 99 mg/dL (ref 65–99)

## 2014-09-18 LAB — DIFFERENTIAL
Basophils Absolute: 0 10*3/uL (ref 0.0–0.1)
Basophils Relative: 0 % (ref 0–1)
EOS ABS: 0.4 10*3/uL (ref 0.0–0.7)
Eosinophils Relative: 3 % (ref 0–5)
Lymphocytes Relative: 19 % (ref 12–46)
Lymphs Abs: 2.5 10*3/uL (ref 0.7–4.0)
MONOS PCT: 7 % (ref 3–12)
Monocytes Absolute: 0.9 10*3/uL (ref 0.1–1.0)
NEUTROS PCT: 71 % (ref 43–77)
Neutro Abs: 9.4 10*3/uL — ABNORMAL HIGH (ref 1.7–7.7)

## 2014-09-18 LAB — PHOSPHORUS: Phosphorus: 3.2 mg/dL (ref 2.5–4.6)

## 2014-09-18 LAB — MAGNESIUM: MAGNESIUM: 1.5 mg/dL — AB (ref 1.7–2.4)

## 2014-09-18 LAB — TRIGLYCERIDES: Triglycerides: 130 mg/dL (ref <150)

## 2014-09-18 LAB — PREALBUMIN: PREALBUMIN: 28.3 mg/dL (ref 18–38)

## 2014-09-18 MED ORDER — TRACE MINERALS CR-CU-MN-SE-ZN 10-1000-500-60 MCG/ML IV SOLN
INTRAVENOUS | Status: DC
  Filled 2014-09-18: qty 1992

## 2014-09-18 MED ORDER — INSULIN ASPART 100 UNIT/ML ~~LOC~~ SOLN
0.0000 [IU] | SUBCUTANEOUS | Status: DC
  Administered 2014-09-18 (×3): 2 [IU] via SUBCUTANEOUS
  Administered 2014-09-18 – 2014-09-19 (×2): 3 [IU] via SUBCUTANEOUS
  Administered 2014-09-19 (×4): 2 [IU] via SUBCUTANEOUS
  Administered 2014-09-20: 3 [IU] via SUBCUTANEOUS
  Administered 2014-09-20: 2 [IU] via SUBCUTANEOUS

## 2014-09-18 MED ORDER — FAT EMULSION 20 % IV EMUL
240.0000 mL | INTRAVENOUS | Status: DC
  Filled 2014-09-18: qty 250

## 2014-09-18 MED ORDER — FAT EMULSION 20 % IV EMUL
240.0000 mL | INTRAVENOUS | Status: AC
  Administered 2014-09-18: 240 mL via INTRAVENOUS
  Filled 2014-09-18: qty 250

## 2014-09-18 MED ORDER — TRACE MINERALS CR-CU-MN-SE-ZN 10-1000-500-60 MCG/ML IV SOLN
INTRAVENOUS | Status: AC
  Administered 2014-09-18: 17:00:00 via INTRAVENOUS
  Filled 2014-09-18: qty 1440

## 2014-09-18 MED ORDER — OXYCODONE-ACETAMINOPHEN 5-325 MG PO TABS: 1.0000 | ORAL_TABLET | ORAL | Status: DC | PRN

## 2014-09-18 MED ORDER — MAGNESIUM SULFATE IN D5W 10-5 MG/ML-% IV SOLN
1.0000 g | Freq: Once | INTRAVENOUS | Status: AC
  Administered 2014-09-18: 1 g via INTRAVENOUS
  Filled 2014-09-18: qty 100

## 2014-09-18 NOTE — Progress Notes (Addendum)
[facility] PROGRESS NOTE  [name] OJJ:[id number] DOB: Jul 01, 1940 DOA: 08/22/2014 PCP: Kandice Hams, MD  BRIEF NARRATIVE 74 y.o. female with past medical history of dyslipidemia, hypertension, long standing ulcerative colitis and recently hospitalized from 08/07/2014 through 08/21/2014 for ulcerative colitis flare. She had flexible sigmoidoscopy 08/08/2014 which demonstrated inflammatory changes from the rectum consistent with ulcerative colitis. She presented to [facility] long hospital with perianal pain started the night prior to the admission. CT scan on the admission demonstrated a complex perirectal infection extending up toward the retroperitoneal area.  On 08/21/13 , patient underwent incision and drainage of the large issue rectal abscess on the right side. She was started on vancomycin and Zosyn. Postprocedure she was doing fine but has developed large amount of bleed on the morning of 08/23/2014. GI was consulted and pt was started on prednisone and mesalamine.  Hospital course has been complicated with ongoing rectal bleed so Additionally, she was found to have left LE DVT On venous doppler done 7/12.   On 7/20, pt underwent flex sigmoidoscopy with findings of significant Lt sided disease with GI recs for continued bowel rest, PO flagyl, increased steroids, and Remicade. After several days of bowel rest, pt's WBC rose and pt's steroids were tapered for infection to subside. Patient taken to OR by surgery for laproscopic diverting ileostomy on 7/27.   Assessment/Plan: Sepsis secondary to perirectal abscess  Incision and drainage by surgery on 08/23/2014. Blood cultures negative. Abscess cultures showed rare yeast and multiple organisms.   -Diverting ileostomy on 7/27. 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 Ileostomy functioning well.  wound care  following. -tolerating  full liquid. May advance to regular if surgery agrees. Wean TPN. PT evaluation.  Antibiotic course: Recommended by ID for a total 14 day duration of antibiotic (was continued on fluconazole and Flagyl) with stop date on 7/18,  however due to stool in abscess cavity and possible fistula antibiotic has been prolonged. Zosyn added due to worsening WBC and encephalopathy. Flagyl  Discontinued. Will d/w surgery on duration of abx.  Rectal bleed secondary to ulcerative colitis with acute blood loss anemia -Patient started on prednisone and mesalamine which was unable to control rectal bleed and thus started on Remicade. Steroid  being tapered due to concern for persistent abscess. (Now on 10 mg Solu-Medrol daily) -Received a total of 3 u PRBC since admission. (Hemoglobin 12 on admission which has dropped to 7-8. Slowly improving. ) Leukocytosis persists. Still having  rectal bleed With stool from the fistula. Wbc improving.     Bilateral lower extremity DVT Not a candidate for anticoagulation due to ongoing rectal bleed and uncontrolled ulcerative colitis. IVC filter placed during this hospitalization.    Essential hypertension  Resumed home dose metoprolol. Continue when necessary hydralazine  Hypokalemia/hyponatremia Likely secondary to GI loss. Loree Fee being adjusted with TPN.  Still has mild hyponatremia. On  salt tablets.   Generalized weakness PT evaluation pending. Eventually needs skilled nursing facility.  Anxiety and insomnia Patient anxious with prolonged hospital stay and has difficulty sleeping. Added low dose bid  Klonopin PRN and bedtime Ambien.  Protein calorie malnutrition Nutritionist  Following.   DVT prophylaxis: SCDs  Diet: Full Liquid. Weaning off TPN as diet advanced  Code Status: Full code Family Communication: daughter Harmon Pier  at bedside Disposition Plan:   SNF possibly sometime this  week  Consultants:  CCS  GI    Procedures:  Flexible sigmoidoscopy on 7/20  Doppler lower extremity  IVC filter  PICC line  laproscopic diverting ileostomy on 7/27  Antibiotics:  Fluconazole 7/18--  Zosyn 7/25--  Flagyl 7/4-7/25  HPI/Subjective: Patient seen and examined . No abdominal pain today. Tolerating diet. Slept better after ambien given  Objective: Filed Vitals:   09/18/14 0541  BP: 124/69  Pulse: 70  Temp: 98 F (36.7 C)  Resp: 20    Intake/Output Summary (Last 24 hours) at 09/18/14 0841 Last data filed at 09/18/14 0540  Gross per 24 hour  Intake   1636 ml  Output   2975 ml  Net  -1339 ml   Filed Weights   09/10/14 0638  Weight: 107.3 kg (236 lb 8.9 oz)    Exam:   General:  NAD, fatigued  HEENT:  moist mucosa, supple neck   chest: clear b/l   CVS: NS1&S2, no murmurs  GI: soft, ileostomy bag with some stool, BS+, nontender.  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, rectal fistula with  blood mixed stool ( ;last examined on 7/30)  Musculoskeletal: warm, no edema  Data Reviewed: Basic Metabolic Panel:  Recent Labs Lab 09/13/14 0420 09/14/14 0720 09/15/14 0427 09/17/14 0445 09/18/14 0630  NA 132* 130* 131* 131* 130*  K 4.5 4.8 4.3 4.1 4.0  CL 95* 97* 97* 95* 96*  CO2 30 30 31 29 28   GLUCOSE 123* 98 108* 117* 110*  BUN 25* 23* 25* 23* 20  CREATININE 0.47 0.55 0.49 0.38* 0.52  CALCIUM 8.0* 7.8* 8.1* 8.2* 8.1*  MG  --  1.7  --   --  1.5*  PHOS  --  4.0  --   --  3.2   Liver Function Tests:  Recent Labs Lab 09/13/14 0420 09/14/14 0720 09/18/14 0630  AST 30 35 24  ALT 25 23 27   ALKPHOS 87 86 78  BILITOT 0.5 0.7 0.3  PROT 5.0* 4.8* 5.0*  ALBUMIN 2.0* 1.9* 2.0*   No results for input(s): LIPASE, AMYLASE in the last 168 hours. No results for input(s): AMMONIA in the last 168 hours. CBC:  Recent Labs Lab 09/13/14 0420 09/14/14 0455 09/15/14 0427 09/16/14 0555 09/18/14 0630  WBC 20.3* 18.2* 14.6* 17.0* 13.2*  NEUTROABS  --   --    --   --  9.4*  HGB 8.8* 8.7* 8.8* 9.5* 8.7*  HCT 27.3* 26.4* 26.8* 28.9* 27.0*  MCV 91.9 95.0 95.7 94.8 95.1  PLT 235 311 218 247 236   Cardiac Enzymes: No results for input(s): CKTOTAL, CKMB, CKMBINDEX, TROPONINI in the last 168 hours. BNP (last 3 results) No results for input(s): BNP in the last 8760 hours.  ProBNP (last 3 results) No results for input(s): PROBNP in the last 8760 hours.  CBG:  Recent Labs Lab 09/17/14 1643 09/17/14 2015 09/17/14 2337 09/18/14 0335 09/18/14 0731  GLUCAP 129* 129* 109* 115* 99    No results found for this or any previous visit (from the past 240 hour(s)).   Studies: No results found.  Scheduled Meds: . sodium chloride   Intravenous Once  . amLODipine  10 mg Oral Daily  . fluconazole (DIFLUCAN) IV  400 mg Intravenous Q24H  . insulin aspart  0-20 Units Subcutaneous Q4H  . lip balm  1 application Topical BID  . magnesium sulfate 1 - 4 g bolus IVPB  1 g Intravenous Once  . mesalamine  4.8 g Oral Q breakfast  . methylPREDNISolone (SOLU-MEDROL) injection  10 mg  Intravenous Q24H  . metoprolol tartrate  25 mg Oral BID  . nystatin   Topical BID  . piperacillin-tazobactam (ZOSYN)  IV  3.375 g Intravenous 3 times per day  . saccharomyces boulardii  250 mg Oral BID  . sodium chloride  10-40 mL Intracatheter Q12H  . sodium chloride  3 mL Intravenous Q12H  . sodium chloride  3 mL Intravenous Q12H  . sodium chloride  2 g Oral BID  . zolpidem  5 mg Oral QHS   Continuous Infusions: . sodium chloride 10 mL/hr at 09/13/14 1759  . Marland KitchenTPN (CLINIMIX-E) Adult 83 mL/hr at 09/17/14 1736   And  . fat emulsion 240 mL (09/17/14 1736)      Time spent: [place], [place]  [facility] Pager [phone] If 7PM-7AM, please contact night-coverage at [email or web address], password [facility] 09/18/2014, 8:41 AM  LOS: 27 days

## 2014-09-18 NOTE — Consult Note (Addendum)
WOC ostomy follow up Stoma type/location: RLQ Ileostomy Stomal assessment/size: 1 and 1/4 inches oval at rest, round with gentle traction applied to 12 o'clock area. Slightly budded, red, moist. Peristomal assessment: intact, clear Treatment options for stomal/peristomal skin: skin barrier ring Output thin brown effluent with stool flecks. Ostomy pouching: 2pc. Pouching system (2 and 1/4 inch) with skin barrier ring Education provided: Husband is legally blind and patient is sleeping after having been up for two hours today. Cannot recall closure of Lock and Roll feature of ostomy pouching system.  It is apparent that she will require support of 24 hour care providers for ostomy care (SNF is anticipated as discharge setting) for continued teaching of pouch emptying and care of wound. Enrolled patient in [place] Start Discharge program: Yes [place] nursing team will follow, and will remain available to this patient, the nursing, surgical and medical teams.   Thanks, Maudie Flakes, MSN, RN, [place], [place], [place] [phone])

## 2014-09-18 NOTE — Progress Notes (Signed)
Physical Therapy Treatment Patient Details Name: JANAYE CORP MRN: [id number] DOB: 03/13/1940 Today's Date: 09/18/2014    History of Present Illness 74 yo female adm with complex perirectal infection extending up toward the retroperitoneal area; pt underwent I& D on 08/23/14, bil LE DVTs-s/p IVC vilter. S/P diverting ileostomy 09/13/14. PMHx:  HTN, CVA, ulcerative colitis    PT Comments    Pt required increased assist to transition from supine to EOB.  Very deconditioned with extended length of stay.  Pt has not been getting OOB.  Attempted sit to stand + 3 assist and with B Platform EVA walker however pt was unable to clear hips off bed and unable to put forth full effort in fear of falling.  Used [employer] fast streamer to transition pt from EOB to recliner.  Max c/o fatigue after this session as pt sat EOB nearly 12 min.  Pt would benefit from getting up in recliner daily with nursing via left to decrease her fear and increase her Rehab potential.   Follow Up Recommendations  SNF     Equipment Recommendations       Recommendations for Other Services       Precautions / Restrictions Precautions Precautions: Fall Restrictions Weight Bearing Restrictions: No    Mobility  Bed Mobility Overal bed mobility: Needs Assistance Bed Mobility: Supine to Sit     Supine to sit: +2 for physical assistance;Max assist     General bed mobility comments: increased time, assist for trunk and bil LEs. Utilized bedpad for scooting, positioning.  Poor sitting balance with posterior lean.  Increased fear/anxiety sitting EOB despite + 2 support.    Transfers Overall transfer level: Needs assistance               General transfer comment: attempted sit to stand + 3 assist however pt too fearful/pushing back and unable to clear hips off bed while utilizing B platform EVA walker.  Too weak.  Used Hoyer Lift to transfer pt from sitting EOB to recliner.    Ambulation/Gait                 Stairs             Wheelchair Mobility    Modified Rankin (Stroke Patients Only)       Balance                                    Cognition Arousal/Alertness: Awake/alert Behavior During Therapy: WFL for tasks assessed/performed Overall Cognitive Status: Within Functional Limits for tasks assessed                      Exercises      General Comments        Pertinent Vitals/Pain Pain Assessment: No/denies pain    Home Living                      Prior Function            PT Goals (current goals can now be found in the care plan section) Progress towards PT goals: Progressing toward goals    Frequency  Min 3X/week    PT Plan Current plan remains appropriate    Co-evaluation             End of Session Equipment Utilized During Treatment: Gait belt Activity Tolerance: Other (comment) (very deconditioned and fearful od  falling)       Time: 8984-2103 PT Time Calculation (min) (ACUTE ONLY): 30 min  Charges:  $Therapeutic Activity: 23-37 mins                    G Codes:      Rica Koyanagi  PTA [facility]  Acute  Rehab Pager      [phone]

## 2014-09-18 NOTE — Progress Notes (Signed)
Patient ID: [name], female   DOB: 25-Feb-1940, 74 y.o.   MRN: [id number]    Progress Note   Subjective  Feels better-denies abdominal pain, says perirectal area less uncomfortable, minimal drainage Tolerating clears   Objective   Vital signs in last 24 hours: Temp:  [98 F (36.7 C)-98.2 F (36.8 C)] 98 F (36.7 C) (08/01 0541) Pulse Rate:  [64-70] 70 (08/01 0541) Resp:  [18-20] 20 (08/01 0541) BP: (120-124)/(62-70) 124/69 mmHg (08/01 0541) SpO2:  [95 %-99 %] 95 % (08/01 0541) Last BM Date: 09/17/14 (right lower ostomy) General:    Elderly AA female in NAD Heart:  Regular rate and rhythm; no murmurs Lungs: Respirations even and unlabored, lungs CTA bilaterally Abdomen:  Soft, nontender and nondistended. Normal bowel sounds.Ileostomy with liquid green stool Extremities:  Without edema. Neurologic:  Alert and oriented,  grossly normal neurologically. Psych:  Cooperative. Normal mood and affect.  Intake/Output from previous day: 07/31 0701 - 08/01 0700 In: 1636 [P.O.:280; I.V.:240; TPN:1116] Out: 2975 [Urine:2025; Stool:950] Intake/Output this shift:    Lab Results:  Recent Labs  09/16/14 0555 09/18/14 0630  WBC 17.0* 13.2*  HGB 9.5* 8.7*  HCT 28.9* 27.0*  PLT 247 236   BMET  Recent Labs  09/17/14 0445 09/18/14 0630  NA 131* 130*  K 4.1 4.0  CL 95* 96*  CO2 29 28  GLUCOSE 117* 110*  BUN 23* 20  CREATININE 0.38* 0.52  CALCIUM 8.2* 8.1*   LFT  Recent Labs  09/18/14 0630  PROT 5.0*  ALBUMIN 2.0*  AST 24  ALT 27  ALKPHOS 78  BILITOT 0.3   PT/INR No results for input(s): LABPROT, INR in the last 72 hours.  Studies/Results: No results found.     Assessment / Plan:    #1 74 yo female with IBD with nonhealing perirectal abscess- s/p I/D 7/6 S/P diverting ileostomy 7/27 On Zosyn, low dose Solumedrol 10 QD,Lialda Remicade 7/15 Improving- ?does she need repeat imaging of perirectal abscess  Principal Problem:   Abscess, perirectal s/p I&D  08/23/2014 Active Problems:   Essential hypertension   Inflammatory bowel disease (IBD) with colitis   Sepsis   Infection due to yeast   Acute blood loss anemia   Leukocytosis   Hypokalemia   Hyponatremia   General weakness   Thrombocytopenia   Ulcerative colitis   DVT (deep venous thrombosis)   DVT (deep vein thrombosis) in pregnancy   Cold   Fasciitis     LOS: 27 days   [name]  09/18/2014, 8:47 AM   ________________________________________________________________________  [name] GI MD note:  I personally examined the patient, reviewed the data and agree with the assessment and plan described above.  She is on low dose steroids and lialda for her IBD (probable Crohn's).  Received remicade 7/15 and is due for another dose around now however I'm reluctant to add this currently.  Will follow along.   [name], MD [facility] Pager [phone]

## 2014-09-18 NOTE — Progress Notes (Signed)
I looked at the dressing and there is stool in the wound again today, she didn't have stool when I changed it last week.  I am having the nurse Irrigate wound gently with saline and repack.  It is very tender and painful to do the dressing change.

## 2014-09-18 NOTE — Progress Notes (Signed)
Clinical Social Work  CSW met with patient at bedside to provide bed offers. Patient reports that she is unfamiliar with places and asked CSW to call dtr. CSW spoke with dtr (Ramona) who reports she has reviewed list and would prefer placement at [facility]. CSW shared this information with patient who reports she wants to speak with dtr prior to making and final decisions. CSW will continue to follow.  [place], [place] [phone]

## 2014-09-18 NOTE — Progress Notes (Addendum)
[place] NOTE  Pharmacy Consult for TPN Indication: severe IBD with fistula and abscess  Allergies  Allergen Reactions  . Clindamycin/Lincomycin Diarrhea    Leads to colitis flare  . Ivp Dye [Iodinated Diagnostic Agents] Other (See Comments)    "almost passed out"    Patient Measurements: Height: 5\' 9"  (175.3 cm) Weight:  (pts bed not calibrated at this time; refusing lift) IBW/kg (Calculated) : 66.2 Adjusted Body Weight: 81.3kg  Vital Signs: Temp: 98 F (36.7 C) (08/01 0541) Temp Source: Oral (08/01 0541) BP: 124/69 mmHg (08/01 0541) Pulse Rate: 70 (08/01 0541) Intake/Output from previous day: 07/31 0701 - 08/01 0700 In: 1636 [P.O.:280; I.V.:240; TPN:1116] Out: 2975 [Urine:2025; Stool:950] Intake/Output from this shift:    Labs:  Recent Labs  09/16/14 0555 09/18/14 0630  WBC 17.0* 13.2*  HGB 9.5* 8.7*  HCT 28.9* 27.0*  PLT 247 236     Recent Labs  09/17/14 0445 09/18/14 0630  NA 131* 130*  K 4.1 4.0  CL 95* 96*  CO2 29 28  GLUCOSE 117* 110*  BUN 23* 20  CREATININE 0.38* 0.52  CALCIUM 8.2* 8.1*  MG  --  1.5*  PHOS  --  3.2  PROT  --  5.0*  ALBUMIN  --  2.0*  AST  --  24  ALT  --  27  ALKPHOS  --  78  BILITOT  --  0.3   Estimated Creatinine Clearance: 78 mL/min (by C-G formula based on Cr of 0.52).    Recent Labs  09/17/14 2337 09/18/14 0335 09/18/14 0731  GLUCAP 109* 115* 99     Medications: Continuous Infusions: . sodium chloride 10 mL/hr at 09/13/14 1759  . Marland KitchenTPN (CLINIMIX-E) Adult 83 mL/hr at 09/17/14 1736   And  . fat emulsion 240 mL (09/17/14 1736)   Insulin Requirements:  Resistant SSI: 9 units in previous 24 hrs Insulin in TPN: 35 units/24 hrs   Current Nutrition:  Diet: full liquids -tolerating 75-100% of tray without N/V TPN @ 83 ml/hr, Lipids @ 10 ml/hr  IVF: NS @ KVO   Central access: PICC placed 08/30/14 TPN start date:  7/20  ASSESSMENT                                                                                                           HPI:  74 y.o. female with past medical history of dyslipidemia, hypertension, long standing ulcerative colitis and recently hospitalized from 08/07/2014 through 08/21/2014 for ulcerative colitis flare.  She presented to [facility] 7/5 for readmission d/t perianal pain.  S/p I&D on 08/21/13 of the large issue rectal abscess on the right side.  Further surgery (colectomy vs colostomy) postponed due to severe colitis.  Pharmacy consulted to start TPN for severe IBD with fistula and abscess.  Significant events:  7/5 I&D of complex perirectal abscess 7/20 flex sig: severe left sided colitis consistent with Crohn's colitis; flexi-seal removed.  TPN started. 7/22: TPN titrated to goal rate 7/27: s/p laparoscopic diverting ileostomy (TPN off for ~ 9 hours) 7/29:  diet advanced to full liquid, but returned to CLD in afternoon. 7/30: Minimal intake overnight d/t abd pain.   7/31: attempting advance to fulls again 8/1: tolerating full liquid diet; begin weaning TPN   Today:   Glucose (goal CBGs <150):  no history of diabetes, remains on once daily Solumedrol, CBGs improved remain in goal range. Insulin added to TPN on 7/22.  Electrolytes:  Na, Cl remain low despite additional supplementation since 7/26.  Magnesium low today.  All others WNL including Corrected Ca.    Renal:  SCr low, CrCl 78 ml/min  LFTs:  WNL, alb low  TGs: 128 (7/21), 109 (7/25) - stable  Prealbumin: 19.6 (7/21), 26.9 (7/25) - rising, possibly skewed by steroids  NUTRITIONAL GOALS                                                                                             RD recs (7/26): 1900-2100 Kcal/day, 100-110 g protein / day Clinimix E 5/15 at a goal rate of 83 ml/hr + 20% fat emulsion at 75ml/hr to provide: 100 g/day protein, 1894 Kcal/day.   PLAN                                                                                                                          At 1800 today:  Decrease Clinimix E 5/15 to 75% of goal (52ml/hr).    Decrease insulin in TPN to 10 units/24 hr    TPN to contain standard multivitamins and trace elements.  Continue NaCl tabs 2g PO bid; can increase to TID if Na starts to drop again  Continue 20% fat emulsion at 10 ml/hr.  continue NS at [facility] rate.  Continue CBGs and change to moderate SSI q4h.  Magnesium 1gm IV x1 today.  Recheck Magnesium level in am.   TPN lab panels on Mondays & Thursdays  F/u daily.  Netta Cedars, PharmD, BCPS Pager: [phone] 09/18/2014, 7:56 AM

## 2014-09-18 NOTE — Progress Notes (Signed)
Nutrition Follow-up   DOCUMENTATION CODES:   Obesity unspecified  INTERVENTION:  - Continue TPN/TPN wean per pharmacy - Recommend RNs/techs record fluid intakes each shift - RD will continue to monitor for needs, including need for supplements  NUTRITION DIAGNOSIS:   Altered GI function related to acute illness as evidenced by other (see comment) (fistula requiring TPN). -ongoing, resolving with TPN wean and diet advancement  GOAL:   Patient will meet greater than or equal to 90% of their needs -met with TPN at this time as wean to begin later today  MONITOR:   Weight trends, Labs, I & O's, Other (Comment) (TPN regimen)  REASON FOR ASSESSMENT:   Consult New TPN/TNA  ASSESSMENT:  74 y.o. female with past medical history of dyslipidemia, hypertension, long standing ulcerative colitis and recently hospitalized from 08/07/2014 through 08/21/2014 for ulcerative colitis flare. She had flexible sigmoidoscopy 08/08/2014 which demonstrated inflammatory changes from the rectum consistent with ulcerative colitis.  She presented to [facility] with perianal pain started the night prior to the admission. CT scan on the admission demonstrated a complex perirectal infection extending up toward the retroperitoneal area.  8/1 New consult for pt who is s/p ileostomy, plan to wean TPN, and need to monitor liquid intakes. Recommend that RNs/tech record fluid intakes each shift and record. Pt on FLD and now advanced to Soft diet following breakfast this AM; no intakes recorded. Pt only shakes/nods head to questions and indicates that she did have breakfast this AM (FLD). No family in the room but left Ileostomy Nutrition Therapy handout from [employer] in the room for family use upon d/c.  Plan today per pharmacy: At 1800 today:  Decrease Clinimix E 5/15 to 75% of goal (14m/hr).   Decrease insulin in TPN to 10 units/24 hr   TPN to contain standard  multivitamins and trace elements.  Continue NaCl tabs 2g PO bid; can increase to TID if Na starts to drop again  Continue 20% fat emulsion at 10 ml/hr.  [facility]  Continue CBGs and change to moderate SSI q4h.  Magnesium 1gm IV x1 today. Recheck Magnesium level in am.  TPN lab panels on Mondays & Thursdays  Currently meeting needs with TPN but will monitor for need for supplement to be ordered with TPN wean. Medications reviewed. Labs reviewed; CBGs: 89-181 mg/dL, Na: 130 mmol/L, Cl: 96 mmol/L, Ca: 8.1 mg/dL, Mg: 1.5 mg/dL/  7/29 - Pt's diet advanced to CLD yesterday afternoon with good tolerance and was, therefore, advanced again this AM to FLD - Pt continues with TPN. Per pharmacy note this AM: At 1800 today:  continue Clinimix E 5/15 at goal rate of 83 ml/hr.  Add regular insulin, 45 units/24 hours.  Add 42 mEq/L NaCl to make TPN concentration 0.45%NS.-will not increase NaCl due to advancement to full liquid diet today  TPN to contain standard multivitamins and trace elements.  Continue 20% fat emulsion at 10 ml/hr.  Plan on wean tomorrow if tolerating fulls per surgery TPN is providing 1894 kcal, 100 grams of protein.   7/26 - Pt has been NPO since midnight pending diagnostic laparoscopy with diverting loop ileostomy and possible EUA today - Pt continues with Clinimix E 5/15 @ 83 mL/hr and 20% lipids @ 10 mL/hr which is providing 1894 kcal, 100 grams protein - Meeting needs with TPN alone; will monitor for diet advancement following surgery - Medications reviewed. Labs reviewed; CBGs: 148-208 mg/dL, Na: 126 mmol/L, Cl: 94  mmol/L, BUN elevated, Ca: 7.4 mg/dL.   7/22 Pt currently receiving Clinimix E 5/15 @ 60 ml/hr with 20% lipids @ 10 mL/hr which is providing 1502 kcal, 72 grams of protein; not meeting needs.  Plan per pharmacy for today: At 1800 today:  Increase Clinimix E 5/15 to 83 ml/hr  Continue 20% fat emulsion at 55m/hr.  TPN to contain  standard multivitamins and trace elements.  Provide insulin 15 units/ 24hrs via TPN  Continue IVF at 10 ml/hr.  Continue moderate SSI q4h .  TPN lab panels on Mondays & Thursdays.  7/21 On 7/5 pt underwent I&D of large, R-sided rectal abscess and on 7/20 underwent flex sig which indicated significant L sided disease. Per WHammondRN note 7/20, pt to undergo colostomy placement today (7/21).  Goal rate per pharmacy: Clinimix E 5/15 @ 70 mL/hr with 20% lipids @ 10 mL/hr which will provide 84 grams protein and 1673 kcal which will not meet needs. Recommend increase to 83 mL/hr which will provide 1894 kcal and 100 grams protein.   Diet Order:  Diet - low sodium heart healthy TPN (CLINIMIX-E) Adult DIET SOFT Room service appropriate?: Yes; Fluid consistency:: Thin .TPN (CLINIMIX-E) Adult  Skin:  Wound (see comment) (perineum surgical incision)  Last BM:  7/31  Height:   Ht Readings from Last 1 Encounters:  08/22/14 5' 9"  (1.753 m)    Weight:   Wt Readings from Last 1 Encounters:  08/07/14 229 lb 12.8 oz (104.237 kg)    Ideal Body Weight:  72.73 kg (kg)  BMI:  Body mass index is 34.92 kg/(m^2).  Estimated Nutritional Needs:   Kcal:  1900-2100  Protein:  100-110 grams  Fluid:  2.2-2.5 L/day  EDUCATION NEEDS:   No education needs identified at this time     JJarome Matin RD, LDN Inpatient Clinical Dietitian Pager # [phone] hours/weekend pager # [phone]

## 2014-09-18 NOTE — Progress Notes (Signed)
Date:  September 18, 2014 U.R. performed for needs and level of care. Will continue to follow for Case Management needs.  Terran Klinke, RN, BSN, CCM   [phone] 

## 2014-09-18 NOTE — Progress Notes (Signed)
5 Days Post-Op  Subjective: Ileostomy is working better.  She is doing OK with the PO's (full liquids)  She says her pain isn't to bad and her dressing changes are less painful.    Objective: Vital signs in last 24 hours: Temp:  [98 F (36.7 C)-98.2 F (36.8 C)] 98 F (36.7 C) (08/01 0541) Pulse Rate:  [64-70] 70 (08/01 0541) Resp:  [18-20] 20 (08/01 0541) BP: (120-124)/(62-70) 124/69 mmHg (08/01 0541) SpO2:  [95 %-99 %] 95 % (08/01 0541) Last BM Date: 09/17/14 (right lower ostomy) 280 PO recorded Diet:  Full liquids/also on TNA 950 from ileostomy Afebrile, VSS NA low, but stable, mag low, prealbumin is better, WBC slowly coming down Intake/Output from previous day: 07/31 0701 - 08/01 0700 In: 1636 [P.O.:280; I.V.:240; TPN:1116] Out: 3875 [Urine:2925; Stool:950] Intake/Output this shift:    General appearance: alert, cooperative and no distress GI: soft, + BS, no distension, port sites look good.  Ostomy bag is full  Foley still in Lab Results:   Recent Labs  09/16/14 0555 09/18/14 0630  WBC 17.0* 13.2*  HGB 9.5* 8.7*  HCT 28.9* 27.0*  PLT 247 236    BMET  Recent Labs  09/17/14 0445 09/18/14 0630  NA 131* 130*  K 4.1 4.0  CL 95* 96*  CO2 29 28  GLUCOSE 117* 110*  BUN 23* 20  CREATININE 0.38* 0.52  CALCIUM 8.2* 8.1*   PT/INR No results for input(s): LABPROT, INR in the last 72 hours.   Recent Labs Lab 09/13/14 0420 09/14/14 0720 09/18/14 0630  AST 30 35 24  ALT 25 23 27   ALKPHOS 87 86 78  BILITOT 0.5 0.7 0.3  PROT 5.0* 4.8* 5.0*  ALBUMIN 2.0* 1.9* 2.0*     Lipase     Component Value Date/Time   LIPASE 13* 08/07/2014 1201     Studies/Results: No results found.  Medications: . sodium chloride   Intravenous Once  . amLODipine  10 mg Oral Daily  . fluconazole (DIFLUCAN) IV  400 mg Intravenous Q24H  . insulin aspart  0-20 Units Subcutaneous Q4H  . lip balm  1 application Topical BID  . magnesium sulfate 1 - 4 g bolus IVPB  1 g  Intravenous Once  . mesalamine  4.8 g Oral Q breakfast  . methylPREDNISolone (SOLU-MEDROL) injection  10 mg Intravenous Q24H  . metoprolol tartrate  25 mg Oral BID  . nystatin   Topical BID  . piperacillin-tazobactam (ZOSYN)  IV  3.375 g Intravenous 3 times per day  . saccharomyces boulardii  250 mg Oral BID  . sodium chloride  10-40 mL Intracatheter Q12H  . sodium chloride  3 mL Intravenous Q12H  . sodium chloride  3 mL Intravenous Q12H  . sodium chloride  2 g Oral BID  . 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 Byerly POD 5 Anemia with transfusion Malnutrition/deconditioning on TNA Hyponatremia/Hypokalemia Antibiotics : Ongoing from 08/07/14 including Vancomycin, Flagyl, started on Zosyn 08/22/14 changed back to Flagyl 7/20-7/25; Currently Zosyn again 7/25 thru today; Fluconazole, started on 08/22/14 thru today. DVT both lower legs with IVC   Plan:  Advance diet, get nutrition to help with her PO intake to maximize calories and protein.  She is apparently still on bed rest.  I will get  PT to see and start getting her up.  Before this she had so much pain just rolling to her side we have not done much of this.  I will get a bedside commode and see how she does getting up.  I would like to get her foley out, but because she was having so much pain with movement we have not done it so far.  Add PO pain med.  Mag is being replace and start weaning TNA.  Strict I/O, watch she does not get dehydrated after taking her off TNA.  I will look at wound with dressing changes this week.      LOS: 27 days    Dewitt Judice 09/18/2014

## 2014-09-18 NOTE — Care Management Important Message (Signed)
Important Message  Patient Details  Name: [name] MRN: [id number] Date of Birth: July 25, 1940   Medicare Important Message Given:  Yes-third notification given    Shelda Altes 09/18/2014, 3:46 [place] Message  Patient Details  Name: [name] MRN: [id number] Date of Birth: 11/29/1940   Medicare Important Message Given:  Yes-third notification given    Shelda Altes 09/18/2014, 3:45 PM

## 2014-09-19 LAB — GLUCOSE, CAPILLARY
GLUCOSE-CAPILLARY: 132 mg/dL — AB (ref 65–99)
GLUCOSE-CAPILLARY: 134 mg/dL — AB (ref 65–99)
Glucose-Capillary: 131 mg/dL — ABNORMAL HIGH (ref 65–99)
Glucose-Capillary: 135 mg/dL — ABNORMAL HIGH (ref 65–99)
Glucose-Capillary: 183 mg/dL — ABNORMAL HIGH (ref 65–99)

## 2014-09-19 LAB — MAGNESIUM: Magnesium: 1.6 mg/dL — ABNORMAL LOW (ref 1.7–2.4)

## 2014-09-19 MED ORDER — MAGNESIUM SULFATE IN D5W 10-5 MG/ML-% IV SOLN
1.0000 g | Freq: Once | INTRAVENOUS | Status: AC
  Administered 2014-09-19: 1 g via INTRAVENOUS
  Filled 2014-09-19: qty 100

## 2014-09-19 MED ORDER — TRACE MINERALS CR-CU-MN-SE-ZN 10-1000-500-60 MCG/ML IV SOLN
INTRAVENOUS | Status: AC
  Administered 2014-09-19: 18:00:00 via INTRAVENOUS
  Filled 2014-09-19: qty 960

## 2014-09-19 MED ORDER — AMOXICILLIN-POT CLAVULANATE 875-125 MG PO TABS
1.0000 | ORAL_TABLET | Freq: Two times a day (BID) | ORAL | Status: DC
  Administered 2014-09-19 – 2014-09-21 (×4): 1 via ORAL
  Filled 2014-09-19 (×5): qty 1

## 2014-09-19 MED ORDER — FAT EMULSION 20 % IV EMUL
240.0000 mL | INTRAVENOUS | Status: AC
  Administered 2014-09-19: 240 mL via INTRAVENOUS
  Filled 2014-09-19: qty 250

## 2014-09-19 MED ORDER — SODIUM CHLORIDE 0.9 % IV SOLN
5.0000 mg/kg | Freq: Once | INTRAVENOUS | Status: AC
  Administered 2014-09-19: 500 mg via INTRAVENOUS
  Filled 2014-09-19: qty 50

## 2014-09-19 NOTE — Progress Notes (Signed)
Spoke with ID consult Dr Graylon Good on the phone and discussed her clinical presentation and prolonged abx use and requirement with her.  Give absence of fever and improving wbc, recommended to switch to Augmentin as it will have good anaerobic coverage. Recommended duration of another 2-3 weeks may be adequate . In the meantime she will be seen by surgery as outpt and her wound evaluated.

## 2014-09-19 NOTE — Progress Notes (Addendum)
[facility] PROGRESS NOTE  [name] BOF:[id number] DOB: [birth year] DOA: 08/22/2014 PCP: Kandice Hams, MD  Off service note: 74 y.o. female with past medical history of dyslipidemia, hypertension, long standing ulcerative colitis and recently hospitalized from 08/07/2014 through 08/21/2014 for ulcerative colitis flare. She had flexible sigmoidoscopy 08/08/2014 which demonstrated inflammatory changes from the rectum consistent with ulcerative colitis. She presented to [facility] with perianal pain started the night prior to the admission. CT scan on the admission demonstrated a complex perirectal infection extending up toward the retroperitoneal area.  On 08/21/13 , patient underwent incision and drainage of the large issue rectal abscess on the right side. She was started on vancomycin and Zosyn. Postprocedure she was doing fine but has developed large amount of bleed on the morning of 08/23/2014. GI was consulted and pt was started on prednisone and mesalamine.  Hospital course has been complicated with ongoing rectal bleed so Additionally, she was found to have left LE DVT On venous doppler done 7/12.   On 7/20, pt underwent flex sigmoidoscopy with findings of significant Lt sided disease with GI recs for continued bowel rest, PO flagyl, increased steroids, and Remicade. After several days of bowel rest, pt's WBC rose and pt's steroids were tapered for infection to subside. Patient taken to OR by surgery for laproscopic diverting ileostomy on 7/27.  Hospital course prolonged due to slow improvement with ongoing requirement for TNA, deconditioning and   Assessment/Plan: Sepsis secondary to perirectal abscess Incision and drainage by surgery on 08/23/2014. Blood cultures negative. Abscess cultures showed rare yeast and multiple organisms.   -Diverting ileostomy on 7/27. 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 Ileostomy functioning well.  wound care following. -tolerating diet advance to soft. Calorie count ordered. Wean TPN. PT evaluation.  Antibiotic course: Recommended by ID for a total 14 day duration of antibiotic (was continued on fluconazole and Flagyl) with stop date on 7/18,  however due to stool in abscess cavity and possible fistula antibiotic has been prolonged. Zosyn added due to worsening WBC and encephalopathy. Flagyl  Discontinued. Given persistent open fistula wound over her buttocks may need further course of antibiotics. i will ask ID for recommendations.   Rectal bleed secondary to ulcerative colitis with acute blood loss anemia -Patient started on prednisone and mesalamine which was unable to control rectal bleed and thus started on Remicade. Steroid  being tapered due to concern for persistent abscess. (Now on 10 mg Solu-Medrol daily) -Received a total of 3 u PRBC since admission. (Hemoglobin 12 on admission which has dropped to 7-8. Slowly improving. ) Leukocytosis improving. Still having  rectal bleed with stool from the fistula -GI following. Patient received Remicade on 7/15. She is due for another dose but given her current symptoms and being on low-dose state with GI recommends to avoid it. -Foley continued due to open wound.     Bilateral lower extremity DVT Not a candidate for anticoagulation due to ongoing rectal bleed and uncontrolled ulcerative colitis. IVC filter placed during this hospitalization.    Essential hypertension  Resumed home dose metoprolol. Continue when necessary hydralazine.  Hypokalemia/hyponatremia Likely secondary to GI loss. . electrolytes  being adjusted with TPN.  Still has mild hyponatremia. On  salt tablets. Replenish low magnesium.   Generalized weakness PT evaluation pending. Eventually needs skilled nursing facility.  Anxiety and insomnia Improved with Klonopin PRN  and bedtime  Ambien.  Protein calorie malnutrition and deconditioning Nutritionist  Following. Calorie count ordered today.   DVT prophylaxis: SCDs  Diet: soft . Weaning off TPN as diet advanced  Code Status: Full code Family Communication: spoke with daughter Harmon Pier  at bedside on 8/1 Disposition Plan:   SNF once by mouth intake adequately and able to wean off TNA. Hopefully in the next 48-72 hrs.  Consultants:  [facility]  GI    Procedures:  Flexible sigmoidoscopy on 7/20  Doppler lower extremity  IVC filter  PICC line  laproscopic diverting ileostomy on 7/27  Antibiotics:  Fluconazole 7/18--  Zosyn 7/25--  Flagyl 7/4-7/25  HPI/Subjective: Patient seen and examined . Denies any abdominal pain. Reportedly has severe pain during dressing changes in her buttocks.  Objective: Filed Vitals:   09/19/14 0601  BP: 135/62  Pulse: 65  Temp: 98.9 F (37.2 C)  Resp: 16    Intake/Output Summary (Last 24 hours) at 09/19/14 1002 Last data filed at 09/19/14 0452  Gross per 24 hour  Intake    253 ml  Output   4225 ml  Net  -3972 ml   Filed Weights   09/18/14 1700 09/19/14 0512  Weight: 100 kg (220 lb 7.4 oz) 98.6 kg (217 lb 6 oz)    Exam:   General:  Early female in no acute distress. Appears fatigued.  HEENT:  moist mucosa, supple neck   chest: clear b/l   CVS: NS1&S2, no murmurs  GI: soft, ileostomy bag with some stool, BS+, nontender.  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, rectal fistula with  blood mixed stool (last examined on 7/30)  Musculoskeletal: warm, no edema  Data Reviewed: Basic Metabolic Panel:  Recent Labs Lab 09/13/14 0420 09/14/14 0720 09/15/14 0427 09/17/14 0445 09/18/14 0630 09/19/14 0453  NA 132* 130* 131* 131* 130*  --   K 4.5 4.8 4.3 4.1 4.0  --   CL 95* 97* 97* 95* 96*  --   CO2 30 30 31 29 28   --   GLUCOSE 123* 98 108* 117* 110*  --   BUN 25* 23* 25* 23* 20  --   CREATININE 0.47 0.55 0.49 0.38* 0.52  --   CALCIUM 8.0* 7.8* 8.1* 8.2* 8.1*  --   MG  --   1.7  --   --  1.5* 1.6*  PHOS  --  4.0  --   --  3.2  --    Liver Function Tests:  Recent Labs Lab 09/13/14 0420 09/14/14 0720 09/18/14 0630  AST 30 35 24  ALT 25 23 27   ALKPHOS 87 86 78  BILITOT 0.5 0.7 0.3  PROT 5.0* 4.8* 5.0*  ALBUMIN 2.0* 1.9* 2.0*   No results for input(s): LIPASE, AMYLASE in the last 168 hours. No results for input(s): AMMONIA in the last 168 hours. CBC:  Recent Labs Lab 09/13/14 0420 09/14/14 0455 09/15/14 0427 09/16/14 0555 09/18/14 0630  WBC 20.3* 18.2* 14.6* 17.0* 13.2*  NEUTROABS  --   --   --   --  9.4*  HGB 8.8* 8.7* 8.8* 9.5* 8.7*  HCT 27.3* 26.4* 26.8* 28.9* 27.0*  MCV 91.9 95.0 95.7 94.8 95.1  PLT 235 311 218 247 236   Cardiac Enzymes: No results for input(s): CKTOTAL, CKMB, CKMBINDEX, TROPONINI in the last 168 hours. BNP (last 3 results) No results for input(s): BNP in the last 8760 hours.  ProBNP (last 3 results) No results for input(s): PROBNP in the last 8760 hours.  CBG:  Recent  Labs Lab 09/18/14 1654 09/18/14 1944 09/18/14 2338 09/19/14 0328 09/19/14 0731  GLUCAP 147* 148* 121* 131* 135*    No results found for this or any previous visit (from the past 240 hour(s)).   Studies: No results found.  Scheduled Meds: . sodium chloride   Intravenous Once  . amLODipine  10 mg Oral Daily  . fluconazole (DIFLUCAN) IV  400 mg Intravenous Q24H  . insulin aspart  0-15 Units Subcutaneous 6 times per day  . lip balm  1 application Topical BID  . mesalamine  4.8 g Oral Q breakfast  . methylPREDNISolone (SOLU-MEDROL) injection  10 mg Intravenous Q24H  . metoprolol tartrate  25 mg Oral BID  . nystatin   Topical BID  . piperacillin-tazobactam (ZOSYN)  IV  3.375 g Intravenous 3 times per day  . saccharomyces boulardii  250 mg Oral BID  . sodium chloride  10-40 mL Intracatheter Q12H  . sodium chloride  3 mL Intravenous Q12H  . sodium chloride  3 mL Intravenous Q12H  . sodium chloride  2 g Oral BID  . zolpidem  5 mg Oral QHS    Continuous Infusions: . Marland KitchenTPN (CLINIMIX-E) Adult 60 mL/hr at 09/18/14 1728   And  . fat emulsion 240 mL (09/18/14 1729)  . sodium chloride 10 mL/hr at 09/13/14 1759      Time spent: [place], [place]  [facility] Pager [phone] If 7PM-7AM, please contact night-coverage at [email or web address], password [facility] 09/19/2014, 10:02 AM  LOS: 28 days

## 2014-09-19 NOTE — Progress Notes (Addendum)
Patient ID: [name], female   DOB: 06-10-1940, 74 y.o.   MRN: [id number]    Progress Note   Subjective  Comfortable, says she feels ok, no severe abdominal discomfort, ostomy draining dark liquid stool Says very painful when perirectal dressing changed   Objective   Vital signs in last 24 hours: Temp:  [98.8 F (37.1 C)-99.1 F (37.3 C)] 98.9 F (37.2 C) (08/02 0601) Pulse Rate:  [65-73] 65 (08/02 0601) Resp:  [16-18] 16 (08/02 0601) BP: (121-135)/(62-64) 135/62 mmHg (08/02 0601) SpO2:  [95 %-97 %] 96 % (08/02 0601) Weight:  [217 lb 6 oz (98.6 kg)-220 lb 7.4 oz (100 kg)] 217 lb 6 oz (98.6 kg) (08/02 0512) Last BM Date: 09/19/14 General:    Elderly AA  female in NAD Heart:  Regular rate and rhythm; no murmurs Lungs: Respirations even and unlabored, lungs CTA bilaterally Abdomen:  Soft, nontender and nondistended. Normal bowel sounds,ostomy RLQ. Extremities:  Without edema. Neurologic:  Alert and oriented,  grossly normal neurologically. Psych:  Cooperative. Normal mood and affect.  Intake/Output from previous day: 08/01 0701 - 08/02 0700 In: 253 [P.O.:240; I.V.:13] Out: 4225 [Urine:4075; Stool:150] Intake/Output this shift:    Lab Results:  Recent Labs  09/18/14 0630  WBC 13.2*  HGB 8.7*  HCT 27.0*  PLT 236   BMET  Recent Labs  09/17/14 0445 09/18/14 0630  NA 131* 130*  K 4.1 4.0  CL 95* 96*  CO2 29 28  GLUCOSE 117* 110*  BUN 23* 20  CREATININE 0.38* 0.52  CALCIUM 8.2* 8.1*   LFT  Recent Labs  09/18/14 0630  PROT 5.0*  ALBUMIN 2.0*  AST 24  ALT 27  ALKPHOS 78  BILITOT 0.3   PT/INR No results for input(s): LABPROT, INR in the last 72 hours.  Studies/Results: No results found.     Assessment / Plan:    #1 74 yo female with severe colitis /Crohns  With perirectal abscess /fistula-s/p diverting ileostomy 7/27 On Zosyn Doubt she can tolerate or retain local therapy with enemas etc  Wonder if colectomy would be in her best interest  long term-will discuss Will need to resume Remicade as soon as surgery feels infection has cleared, as this is the best option for fistula to heal.    Principal Problem:   Abscess, perirectal s/p I&D 08/23/2014 Active Problems:   Essential hypertension   Inflammatory bowel disease (IBD) with colitis   Sepsis   Infection due to yeast   Acute blood loss anemia   Leukocytosis   Hypokalemia   Hyponatremia   General weakness   Thrombocytopenia   Ulcerative colitis   DVT (deep venous thrombosis)   DVT (deep vein thrombosis) in pregnancy   Cold   Fasciitis     LOS: 28 days   Amy [name]  09/19/2014, 10:03 AM  ________________________________________________________________________  [name] GI MD note:  I personally examined the patient, reviewed the data and agree with the assessment and plan described above.  Ongoing discussions with surgery team about when they feel it is safe to resume remicade. 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  She is due around now for Remicade dose #2.  She does not need lialda in the meantime.  She ate most of her breakfast, hopefully can stop her TNA as she eats more and more.  Nutrition will be very important for her to heal the abscess, Crohn's.  [name], MD [facility] Pager [phone]

## 2014-09-19 NOTE — Consult Note (Signed)
WOC wound consult note Reason for Consult: Perineal wound (rectal abscess). Seen today with Modena Jansky, PA-C and Dr. Greer Pickerel, CCS. Wound type: Infectious Pressure Ulcer POA: No Measurement: Grossly estimated (5cm x 3c, x 6cm) Wound bed:red, moist. No necrotic tissue Drainage (amount, consistency, odor) Deep red blood clots irrigated out today, also cloudy to clear irrigant. Odor consistent with old blood, not infection Periwound:intact, moist Dressing procedure/placement/frequency:Twice daily irrigations with NS and filling of dead space with saline moistened kerlix roll gauze is indicated.  Guidance provided for Nursing in orders for use of irrigation set with Toomey syringe and for amount and technique for irrigation for best results. Will Creig Hines will be contacted for wound care M-F and will see as often as he is available at time of dressing changes. Patient was premedicated prior to dressing change and cried in pain throughout.  Immediately after, she is comfortable.  Staff encouraged to use minimal bed linen layers under patient to maximize the effect of therapeutic mattress with low air loss feature. I will obtain a pressure redistribution chair cushion and pressure redistribution heel boots today.  Ostomy not seen today as patient is too stressed to participate in any teaching session. [place] nursing team will follow, and will remain available to this patient, the nursing, surgical and medical teams.   Thanks, Maudie Flakes, MSN, RN, [place], [place], [place] [phone])

## 2014-09-19 NOTE — Progress Notes (Signed)
[facility] NOTE  Pharmacy Consult for TPN Indication: severe IBD with fistula and abscess  Allergies  Allergen Reactions  . Clindamycin/Lincomycin Diarrhea    Leads to colitis flare  . Ivp Dye [Iodinated Diagnostic Agents] Other (See Comments)    "almost passed out"    Patient Measurements: Height: 5\' 9"  (175.3 cm) Weight: 217 lb 6 oz (98.6 kg) IBW/kg (Calculated) : 66.2 Adjusted Body Weight: 81.3kg  Vital Signs: Temp: 98.9 F (37.2 C) (08/02 0601) Temp Source: Oral (08/02 0601) BP: 135/62 mmHg (08/02 0601) Pulse Rate: 65 (08/02 0601) Intake/Output from previous day: 08/01 0701 - 08/02 0700 In: 253 [P.O.:240; I.V.:13] Out: 4225 [Urine:4075; Stool:150] Intake/Output from this shift:    Labs:  Recent Labs  09/18/14 0630  WBC 13.2*  HGB 8.7*  HCT 27.0*  PLT 236     Recent Labs  09/17/14 0445 09/18/14 0630 09/19/14 0453  NA 131* 130*  --   K 4.1 4.0  --   CL 95* 96*  --   CO2 29 28  --   GLUCOSE 117* 110*  --   BUN 23* 20  --   CREATININE 0.38* 0.52  --   CALCIUM 8.2* 8.1*  --   MG  --  1.5* 1.6*  PHOS  --  3.2  --   PROT  --  5.0*  --   ALBUMIN  --  2.0*  --   AST  --  24  --   ALT  --  27  --   ALKPHOS  --  78  --   BILITOT  --  0.3  --   PREALBUMIN  --  28.3  --   TRIG  --  130  --    Estimated Creatinine Clearance: 74.8 mL/min (by C-G formula based on Cr of 0.52).    Recent Labs  09/18/14 2338 09/19/14 0328 09/19/14 0731  GLUCAP 121* 131* 135*     Medications: Continuous Infusions: . Marland KitchenTPN (CLINIMIX-E) Adult 60 mL/hr at 09/18/14 1728   And  . fat emulsion 240 mL (09/18/14 1729)  . sodium chloride 10 mL/hr at 09/13/14 1759   Insulin Requirements:  Resistant SSI: 13 units in previous 24 hrs Insulin in TPN: 35 units/24 hrs   Current Nutrition:  Diet: full liquids -tolerating 75-100% of tray without N/V TPN @ 83 ml/hr, Lipids @ 10 ml/hr  IVF: NS @ KVO   Central access: PICC placed 08/30/14 TPN start date:   7/20  ASSESSMENT                                                                                                          HPI:  74 y.o. female with past medical history of dyslipidemia, hypertension, long standing ulcerative colitis and recently hospitalized from 08/07/2014 through 08/21/2014 for ulcerative colitis flare.  She presented to [facility] 7/5 for readmission d/t perianal pain.  S/p I&D on 08/21/13 of the large issue rectal abscess on the right side.  Further surgery (colectomy vs colostomy) postponed due to severe colitis.  Pharmacy  consulted to start TPN for severe IBD with fistula and abscess.  Significant events:  7/5 I&D of complex perirectal abscess 7/20 flex sig: severe left sided colitis consistent with Crohn's colitis; flexi-seal removed.  TPN started. 7/22: TPN titrated to goal rate 7/27: s/p laparoscopic diverting ileostomy (TPN off for ~ 9 hours) 7/29: diet advanced to full liquid, but returned to CLD in afternoon. 7/30: Minimal intake overnight d/t abd pain.   7/31: attempting advance to fulls again 8/1: tolerating full liquid diet; begin weaning TPN 8/2: tolerating soft diet, ate 100% breakfast   Today:   Glucose (goal CBGs <150):  no history of diabetes, remains on once daily Solumedrol, CBGs improved, 99-181, slightly elevated above goal range. Insulin added to TPN on 7/22.  Electrolytes:  Na, Cl remain low despite additional supplementation since 7/26.  Magnesium low today.  All others WNL including Corrected Ca.    Renal:  SCr low, CrCl 78 ml/min  LFTs:  WNL, alb low  TGs: 128 (7/21), 109 (7/25), 130 (8/1) - stable  Prealbumin: 19.6 (7/21), 26.9 (7/25), 28.3 (8/1)- rising, possibly skewed by steroids  NUTRITIONAL GOALS                                                                                             RD recs (7/26): 1900-2100 Kcal/day, 100-110 g protein / day Clinimix E 5/15 at a goal rate of 83 ml/hr + 20% fat emulsion at 52ml/hr to  provide: 100 g/day protein, 1894 Kcal/day.   PLAN                                                                                                                         At 1800 today:  Decrease Clinimix E 5/15 to 40 ml/hr (50% of goal)    Continue insulin in TPN with 10 units/24 hr    TPN to contain standard multivitamins and trace elements.  Continue NaCl tabs 2g PO bid; can increase to TID if Na starts to drop again  Continue 20% fat emulsion at 10 ml/hr.  Continue NS at [facility] rate.  Continue CBGs and moderate SSI q4h.  Repeat magnesium 1g IV x 1   CMET tomorrow with Mg  TPN lab panels on Mondays & Thursdays  F/u calorie count 8/2 and plans for further weaning  Ralene Bathe, PharmD, BCPS 09/19/2014, 10:20 AM  Pager: [phone]

## 2014-09-19 NOTE — Progress Notes (Signed)
6 Days Post-Op  Subjective: She doesn't really complain until you do her buttocks dressing change.  Then she just cries thru it.  She had stool again yesterday on the dressing and in the open wound.  I have ask them to irrigate gently with toomey to help clean the wound.  Objective: Vital signs in last 24 hours: Temp:  [98.8 F (37.1 C)-99.1 F (37.3 C)] 98.9 F (37.2 C) (08/02 0601) Pulse Rate:  [65-73] 65 (08/02 0601) Resp:  [16-18] 16 (08/02 0601) BP: (121-135)/(62-64) 135/62 mmHg (08/02 0601) SpO2:  [95 %-97 %] 96 % (08/02 0601) Weight:  [98.6 kg (217 lb 6 oz)-100 kg (220 lb 7.4 oz)] 98.6 kg (217 lb 6 oz) (08/02 0512) Last BM Date: 09/18/14 PO 240 recorded 150 from ileostomy yesterday TM 99.1,  Mag 1.6  Intake/Output from previous day: 08/01 0701 - 08/02 0700 In: 253 [P.O.:240; I.V.:13] Out: 4225 [Urine:4075; Stool:150] Intake/Output this shift:    General appearance: alert, cooperative and no distress GI: soft, taking diet, not sure how much. ileostomy is working not much recorded but it looks normal this AM.  Lab Results:   Recent Labs  09/18/14 0630  WBC 13.2*  HGB 8.7*  HCT 27.0*  PLT 236    BMET  Recent Labs  09/17/14 0445 09/18/14 0630  NA 131* 130*  K 4.1 4.0  CL 95* 96*  CO2 29 28  GLUCOSE 117* 110*  BUN 23* 20  CREATININE 0.38* 0.52  CALCIUM 8.2* 8.1*   PT/INR No results for input(s): LABPROT, INR in the last 72 hours.   Recent Labs Lab 09/13/14 0420 09/14/14 0720 09/18/14 0630  AST 30 35 24  ALT 25 23 27   ALKPHOS 87 86 78  BILITOT 0.5 0.7 0.3  PROT 5.0* 4.8* 5.0*  ALBUMIN 2.0* 1.9* 2.0*     Lipase     Component Value Date/Time   LIPASE 13* 08/07/2014 1201     Studies/Results: No results found.  Medications: . sodium chloride   Intravenous Once  . amLODipine  10 mg Oral Daily  . fluconazole (DIFLUCAN) IV  400 mg Intravenous Q24H  . insulin aspart  0-15 Units Subcutaneous 6 times per day  . lip balm  1 application  Topical BID  . mesalamine  4.8 g Oral Q breakfast  . methylPREDNISolone (SOLU-MEDROL) injection  10 mg Intravenous Q24H  . metoprolol tartrate  25 mg Oral BID  . nystatin   Topical BID  . piperacillin-tazobactam (ZOSYN)  IV  3.375 g Intravenous 3 times per day  . saccharomyces boulardii  250 mg Oral BID  . sodium chloride  10-40 mL Intracatheter Q12H  . sodium chloride  3 mL Intravenous Q12H  . sodium chloride  3 mL Intravenous Q12H  . sodium chloride  2 g Oral BID  . 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 Byerly POD 5 Anemia with transfusion Malnutrition/deconditioning on TNA Hyponatremia/Hypokalemia Antibiotics : Ongoing from 08/07/14 including Vancomycin, Flagyl, started on Zosyn 08/22/14 changed back to Flagyl 7/20-7/25; Currently Zosyn again 7/25 thru today; Fluconazole, started on 08/22/14 thru today. DVT both lower legs, with IVC placed 08/30/14   Plan:  We are starting to mobilize her but it take 2 people to get her up.  Still has foley in because of  this, and poor condition of her buttocks with open fistula wound and ongoing care for almost a month.  I have ask Cecille Rubin McNichols to see her at dressing change to assist in her care.  I am going to get a calorie count before I stop the TNA.  Dr.  Clementeen Graham has converted her to PO Augmentin.  DR. Redmond Pulling has looked at the open rectal wound and it is his opinion that she can go forward with Remicade treatment.      LOS: 28 days    Doyle Tegethoff 09/19/2014

## 2014-09-19 NOTE — Progress Notes (Signed)
Clinical Social Work  CSW met with patient and dtr (Ava) at bedside. Patient reports she is still speaking with family re: plans at DC for SNF but plans remain for [facility] at this time. Patient thanked CSW for visit and CSW will follow up tomorrow.  [place], [place] [phone]

## 2014-09-19 NOTE — Progress Notes (Signed)
NUTRITION NOTE  Calorie Count Note  48 hour calorie count ordered.  Diet: Soft Supplements: none, TPN weaning  Nutrition Dx: Altered GI function related to acute illness as evidenced by other fistula requiring TPN.   Goal: Patient will meet greater than or equal to 90% of their needs.  Intervention: Continue TPN/TPN wean per pharmacy, RNs and techs to record intakes  RD saw for full follow-up yesterday. Kcal Count envelope hung on the door and RD will follow-up daily with results.   Jarome Matin, RD, LDN Inpatient Clinical Dietitian Pager # [phone] After hours/weekend pager # [phone]

## 2014-09-20 LAB — GLUCOSE, CAPILLARY
GLUCOSE-CAPILLARY: 161 mg/dL — AB (ref 65–99)
Glucose-Capillary: 112 mg/dL — ABNORMAL HIGH (ref 65–99)
Glucose-Capillary: 113 mg/dL — ABNORMAL HIGH (ref 65–99)
Glucose-Capillary: 124 mg/dL — ABNORMAL HIGH (ref 65–99)
Glucose-Capillary: 189 mg/dL — ABNORMAL HIGH (ref 65–99)

## 2014-09-20 LAB — BASIC METABOLIC PANEL
Anion gap: 7 (ref 5–15)
BUN: 16 mg/dL (ref 6–20)
CALCIUM: 7.9 mg/dL — AB (ref 8.9–10.3)
CHLORIDE: 98 mmol/L — AB (ref 101–111)
CO2: 25 mmol/L (ref 22–32)
CREATININE: 0.46 mg/dL (ref 0.44–1.00)
GFR calc Af Amer: >60 mL/min (ref >60)
Glucose, Bld: 150 mg/dL — ABNORMAL HIGH (ref 65–99)
Potassium: 4.1 mmol/L (ref 3.5–5.1)
Sodium: 130 mmol/L — ABNORMAL LOW (ref 135–145)

## 2014-09-20 LAB — MAGNESIUM: MAGNESIUM: 1.6 mg/dL — AB (ref 1.7–2.4)

## 2014-09-20 MED ORDER — PREDNISONE 1 MG PO TABS: 0.5000 mg | ORAL_TABLET | Freq: Every day | ORAL | Status: DC

## 2014-09-20 MED ORDER — MAGNESIUM SULFATE IN D5W 10-5 MG/ML-% IV SOLN
1.0000 g | Freq: Once | INTRAVENOUS | Status: AC
  Administered 2014-09-20: 1 g via INTRAVENOUS
  Filled 2014-09-20: qty 100

## 2014-09-20 MED ORDER — PREDNISONE 10 MG PO TABS: 10.0000 mg | ORAL_TABLET | Freq: Every day | ORAL | Status: DC

## 2014-09-20 MED ORDER — PREDNISONE 5 MG PO TABS: 8.0000 mg | ORAL_TABLET | Freq: Every day | ORAL | Status: DC

## 2014-09-20 MED ORDER — PREDNISONE 1 MG PO TABS: 2.0000 mg | ORAL_TABLET | Freq: Every day | ORAL | Status: DC

## 2014-09-20 MED ORDER — PREDNISONE 10 MG PO TABS
10.0000 mg | ORAL_TABLET | Freq: Every day | ORAL | Status: DC
  Administered 2014-09-21: 10 mg via ORAL
  Filled 2014-09-20 (×2): qty 1

## 2014-09-20 MED ORDER — PREDNISONE 1 MG PO TABS: 4.0000 mg | ORAL_TABLET | Freq: Every day | ORAL | Status: DC

## 2014-09-20 MED ORDER — PREDNISONE 5 MG PO TABS: 6.0000 mg | ORAL_TABLET | Freq: Every day | ORAL | Status: DC

## 2014-09-20 MED ORDER — PREDNISONE 1 MG PO TABS: 1.0000 mg | ORAL_TABLET | Freq: Every day | ORAL | Status: DC

## 2014-09-20 NOTE — Progress Notes (Signed)
7 Days Post-Op  Subjective: Sitting up partially in the bed.  A bit confused about all the stuff going on.  Also concerned she will not get enough help after discharge from here.  Objective: Vital signs in last 24 hours: Temp:  [98.5 F (36.9 C)] 98.5 F (36.9 C) (08/03 0700) Pulse Rate:  [71-85] 84 (08/03 0700) Resp:  [18-20] 18 (08/03 0700) BP: (116-125)/(57-77) 125/75 mmHg (08/03 0700) SpO2:  [96 %-97 %] 96 % (08/03 0700) Last BM Date: 09/19/14 305 PO Diet:  Soft  225 from ostomy Afebrile, VSS Labs tomorrow with TNA Intake/Output from previous day: 08/02 0701 - 08/03 0700 In: 2123.3 [P.O.:305; I.V.:40; IV Piggyback:350; TPN:1428.3] Out: 9735 [Urine:4450; Stool:225] Intake/Output this shift:    General appearance: alert, cooperative and no distress GI: soft, sore sites look fine ostomy working.  Lab Results:   Recent Labs  09/18/14 0630  WBC 13.2*  HGB 8.7*  HCT 27.0*  PLT 236    BMET  Recent Labs  09/18/14 0630  NA 130*  K 4.0  CL 96*  CO2 28  GLUCOSE 110*  BUN 20  CREATININE 0.52  CALCIUM 8.1*   PT/INR No results for input(s): LABPROT, INR in the last 72 hours.   Recent Labs Lab 09/14/14 0720 09/18/14 0630  AST 35 24  ALT 23 27  ALKPHOS 86 78  BILITOT 0.7 0.3  PROT 4.8* 5.0*  ALBUMIN 1.9* 2.0*     Lipase     Component Value Date/Time   LIPASE 13* 08/07/2014 1201     Studies/Results: No results found.  Medications: . sodium chloride   Intravenous Once  . amLODipine  10 mg Oral Daily  . amoxicillin-clavulanate  1 tablet Oral Q12H  . fluconazole (DIFLUCAN) IV  400 mg Intravenous Q24H  . insulin aspart  0-15 Units Subcutaneous 6 times per day  . lip balm  1 application Topical BID  . methylPREDNISolone (SOLU-MEDROL) injection  10 mg Intravenous Q24H  . metoprolol tartrate  25 mg Oral BID  . nystatin   Topical BID  . saccharomyces boulardii  250 mg Oral BID  . sodium chloride  10-40 mL Intracatheter Q12H  . sodium chloride  3  mL Intravenous Q12H  . sodium chloride  3 mL Intravenous Q12H  . sodium chloride  2 g Oral BID  . 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 Byerly POD 5 Anemia with transfusion Malnutrition/deconditioning on TNA Hyponatremia/Hypokalemia Severe deconditioning and malnutrition Antibiotics : Ongoing from 08/07/14 including Vancomycin, Flagyl, started on Zosyn 08/22/14 changed back to Flagyl 7/20-7/25; Currently Zosyn again 7/25 thru today; Fluconazole, started on 08/22/14 thru 78/2/16.  Day 2 Augmentin DVT both lower legs, with IVC placed 08/30/14   Plan: Dr. Redmond Pulling and Ardis Hughs discussing next step in care of colitis and fistula.  Dr. Redmond Pulling is OK with restarting Remicade to help heal and close fistula.  We have her on a calorie count, I am going to put her on a regular diet. Continue TNA till we are certain she is taking in adequate calories.  She may need LTAC to continue adequate care of her open wound, and severe deconditioning.     LOS: 29 days    Madalin Hughart 09/20/2014

## 2014-09-20 NOTE — Progress Notes (Signed)
Chaplain paged at [facility] on 19 Sep 2014 at the request of [name] to assist her praying for divine direction as to what to do and how to get there. [name] was very upset when the chaplain arrived and immediately began to pray. After several minutes of call and response praying chaplain lead [name] in meditation therapy (AKA relaxation therapy) to calm her spirit. [name] responded positively to this spiritual intervention.  [name] requests a daytime chaplain to visit her during the day of 3 August to deal with issues she was too tired to deal with at midnight. Please page a chaplain during the workday of 3 August to continue spiritual care and comfort.  Page the chaplain during the night if [name] needs or requests further spiritual care  [name]. Saman Giddens, DMin, MDiv, MA Chaplain

## 2014-09-20 NOTE — Progress Notes (Signed)
Referal received from on call Chaplain to visit patient. Chaplain visited with family and patient. Patient appeared to be emotionally stable   09/20/14 1100  Clinical Encounter Type  Visited With Patient and family together  Visit Type Follow-up;Spiritual support;Social support  Referral From Chaplain   with family present. Patient said that she would have nurse to reach out to [facility] pager if needed.

## 2014-09-20 NOTE — Progress Notes (Signed)
Calorie Count Note  48 hour calorie count ordered.  Diet: Soft Supplements: none, TPN  Breakfast 8/3: 50% hard boiled egg, 50% plain bagel, 50% pork sausage patty (230 kcal, 11 grams protein) Lunch 8/2: 1 bottle of water, 50% baked/grilled fish, 25% whipped potatoes, 1 cornbread muffin (323 kcal, 15.3 grams protein) Breakfast 8/2: 25% decaf coffee with 3 sugars and 2 non-dairy creamers, 100% fruit cocktail, 100% peaches, 100% piece of white toast, 1 piece [place] sausage (342 kcal, 10.5 grams protein) Supplements: none  Total intake: 895 kcal (47% of minimum estimated needs)  36.8 grams protein (37% of minimum estimated needs)  Nutrition Dx: Altered GI function related to acute illness as evidenced by other fistula requiring TPN.   Goal: Patient will meet greater than or equal to 90% of their needs.  Intervention: Continue TPN/TPN wean per pharmacy, RN and techs to record intakes   RD will see pt tomorrow (8/4) for full follow-up.  Jarome Matin, RD, LDN Inpatient Clinical Dietitian Pager # [phone] After hours/weekend pager # [phone]

## 2014-09-20 NOTE — Progress Notes (Addendum)
[facility] PROGRESS NOTE  [name] DDU:[id number] DOB: 01/11/1941 DOA: 08/22/2014 PCP: Kandice Hams, MD  Off service note: 74 y.o. female with past medical history of dyslipidemia, hypertension, long standing ulcerative colitis and recently hospitalized from 08/07/2014 through 08/21/2014 for ulcerative colitis flare. She had flexible sigmoidoscopy 08/08/2014 which demonstrated inflammatory changes from the rectum consistent with ulcerative colitis. She presented to [facility] long hospital with perianal pain started the night prior to the admission. CT scan on the admission demonstrated a complex perirectal infection extending up toward the retroperitoneal area.  On 08/21/13 , patient underwent incision and drainage of the large issue rectal abscess on the right side. She was started on vancomycin and Zosyn. Postprocedure she was doing fine but has developed large amount of bleed on the morning of 08/23/2014. GI was consulted and pt was started on prednisone and mesalamine.  Hospital course has been complicated with ongoing rectal bleed so Additionally, she was found to have left LE DVT On venous doppler done 7/12.   On 7/20, pt underwent flex sigmoidoscopy with findings of significant Lt sided disease with GI recs for continued bowel rest, PO flagyl, increased steroids, and Remicade. After several days of bowel rest, pt's WBC rose and pt's steroids were tapered for infection to subside. Patient taken to OR by surgery for laproscopic diverting ileostomy on 7/27.  Hospital course prolonged due to slow improvement with ongoing requirement for TNA, deconditioning and   Assessment/Plan: Sepsis secondary to perirectal abscess Incision and drainage by surgery on 08/23/2014. Blood cultures negative. Abscess cultures showed rare yeast and multiple organisms.   -Diverting ileostomy on 7/27. 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 Ileostomy functioning well.  wound care following. -tolerating diet advance to soft. Hopefully can wean TPN today if good breakfast and lunch.  Antibiotic course: Recommended by ID for a total 14 day duration of antibiotic (was continued on fluconazole and Flagyl) with stop date on 7/18,  however due to stool in abscess cavity and possible fistula antibiotic has been prolonged. Zosyn added due to worsening WBC and encephalopathy. Flagyl  Discontinued. Given persistent open fistula wound over her buttocks may need further course of antibiotics. Discussed with ID Dr. Graylon Good, he will see the patient today and give official recommendation for antibiotics on discharge.   Rectal bleed secondary to ulcerative colitis with acute blood loss anemia -Patient started on prednisone and mesalamine which was unable to control rectal bleed and thus started on Remicade. Steroid  being tapered due to concern for persistent abscess. Will transition to oral prednisone from tomorrow. -Received a total of 3 u PRBC since admission. (Hemoglobin 12 on admission which has dropped to 7-8. Slowly improving. ) Leukocytosis improving. Still having  rectal bleed with stool from the fistula -GI following. Patient received Remicade on 7/15. Given another dose 8/2 as the medical therapy most likely going to heal her fistula, So on 9/1. -Foley continued due to open wound.     Bilateral lower extremity DVT Not a candidate for anticoagulation due to ongoing rectal bleed and uncontrolled ulcerative colitis. IVC filter placed during this hospitalization.    Essential hypertension  Resumed home dose metoprolol. Continue when necessary hydralazine.  Hypokalemia/hyponatremia Likely secondary to GI loss. . electrolytes  being adjusted with TPN.  Still has mild hyponatremia. On  salt tablets.   Generalized weakness PT evaluation pending. Eventually needs skilled  nursing  facility.  Anxiety and insomnia Improved with Klonopin PRN and bedtime Ambien.  Protein calorie malnutrition and deconditioning Nutritionist  Following. Hopefully can DC TPN today if has good oral intake with breakfast and lunch.   DVT prophylaxis: SCDs, IVC filter  Diet: soft . Weaning off TPN as diet advanced  Code Status: Full code Family Communication: none at bedside. Disposition Plan:   SNF once by mouth intake adequately and able to wean off TNA. Hopefully in the next 48 hrs.  Consultants:  CCS  GI    Procedures:  Flexible sigmoidoscopy on 7/20  Doppler lower extremity  IVC filter  PICC line  laproscopic diverting ileostomy on 7/27  Antibiotics:  Fluconazole 7/18--  Zosyn 7/25--  Flagyl 7/4-7/25  HPI/Subjective: Patient seen and examined . Denies any abdominal pain.reports  pain during dressing changes in her buttocks.  Objective: Filed Vitals:   09/20/14 0700  BP: 125/75  Pulse: 84  Temp: 98.5 F (36.9 C)  Resp: 18    Intake/Output Summary (Last 24 hours) at 09/20/14 1151 Last data filed at 09/20/14 0700  Gross per 24 hour  Intake 1583.34 ml  Output   3700 ml  Net -2116.66 ml   Filed Weights   09/18/14 1700 09/19/14 0512  Weight: 100 kg (220 lb 7.4 oz) 98.6 kg (217 lb 6 oz)    Exam:   General:  elderly female in no acute distress. Appears fatigued.  HEENT:  moist mucosa, supple neck   chest: clear b/l   CVS: NS1&S2, no murmurs  GI: soft, ileostomy bag with some stool, BS+, nontender.  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, rectal fistula   Musculoskeletal: warm, no edema  Data Reviewed: Basic Metabolic Panel:  Recent Labs Lab 09/14/14 0720 09/15/14 0427 09/17/14 0445 09/18/14 0630 09/19/14 0453 09/20/14 0835  NA 130* 131* 131* 130*  --  130*  K 4.8 4.3 4.1 4.0  --  4.1  CL 97* 97* 95* 96*  --  98*  CO2 30 31 29 28   --  25  GLUCOSE 98 108* 117* 110*  --  150*  BUN 23* 25* 23* 20  --  16  CREATININE 0.55 0.49 0.38* 0.52  --  0.46   CALCIUM 7.8* 8.1* 8.2* 8.1*  --  7.9*  MG 1.7  --   --  1.5* 1.6* 1.6*  PHOS 4.0  --   --  3.2  --   --    Liver Function Tests:  Recent Labs Lab 09/14/14 0720 09/18/14 0630  AST 35 24  ALT 23 27  ALKPHOS 86 78  BILITOT 0.7 0.3  PROT 4.8* 5.0*  ALBUMIN 1.9* 2.0*   No results for input(s): LIPASE, AMYLASE in the last 168 hours. No results for input(s): AMMONIA in the last 168 hours. CBC:  Recent Labs Lab 09/14/14 0455 09/15/14 0427 09/16/14 0555 09/18/14 0630  WBC 18.2* 14.6* 17.0* 13.2*  NEUTROABS  --   --   --  9.4*  HGB 8.7* 8.8* 9.5* 8.7*  HCT 26.4* 26.8* 28.9* 27.0*  MCV 95.0 95.7 94.8 95.1  PLT 311 218 247 236   Cardiac Enzymes: No results for input(s): CKTOTAL, CKMB, CKMBINDEX, TROPONINI in the last 168 hours. BNP (last 3 results) No results for input(s): BNP in the last 8760 hours.  ProBNP (last 3 results) No results for input(s): PROBNP in the last 8760 hours.  CBG:  Recent Labs Lab 09/19/14 1704 09/19/14 2038 09/20/14 0016 09/20/14 0351 09/20/14 0737  GLUCAP 132* 134* 112* 113*  124*    No results found for this or any previous visit (from the past 240 hour(s)).   Studies: No results found.  Scheduled Meds: . sodium chloride   Intravenous Once  . amLODipine  10 mg Oral Daily  . amoxicillin-clavulanate  1 tablet Oral Q12H  . fluconazole (DIFLUCAN) IV  400 mg Intravenous Q24H  . insulin aspart  0-15 Units Subcutaneous 6 times per day  . lip balm  1 application Topical BID  . metoprolol tartrate  25 mg Oral BID  . nystatin   Topical BID  . [START ON 09/21/2014] predniSONE  10 mg Oral Q breakfast  . saccharomyces boulardii  250 mg Oral BID  . sodium chloride  10-40 mL Intracatheter Q12H  . sodium chloride  3 mL Intravenous Q12H  . sodium chloride  3 mL Intravenous Q12H  . sodium chloride  2 g Oral BID  . zolpidem  5 mg Oral QHS   Continuous Infusions: . sodium chloride 10 mL/hr at 09/13/14 1759  . Marland KitchenTPN (CLINIMIX-E) Adult 40 mL/hr at  09/19/14 1730   And  . fat emulsion 240 mL (09/19/14 1730)      Time spent: [place], Zakk Borgen  [facility] Pager [phone] If 7PM-7AM, please contact night-coverage at [email or web address], password [facility] 09/20/2014, 11:51 AM  LOS: 29 days

## 2014-09-20 NOTE — Progress Notes (Signed)
Clinical Social Work  CSW met with patient at bedside in order discuss DC plans. Patient reports dtr (Ramona) just left but wanted to speak with CSW. CSW called dtr and left a message. Patient reports that she and dtr are reviewing SNF options and CSW agreeable to continue to follow.  [place], [place] [phone]

## 2014-09-20 NOTE — Progress Notes (Signed)
[facility] Progress Note    Since last GI note: After discussions with surgery we gave her remicade yesterday (dose #2).  Objective: Vital signs in last 24 hours: Temp:  [98.5 F (36.9 C)] 98.5 F (36.9 C) (08/03 0700) Pulse Rate:  [71-85] 84 (08/03 0700) Resp:  [18-20] 18 (08/03 0700) BP: (116-125)/(57-77) 125/75 mmHg (08/03 0700) SpO2:  [96 %-97 %] 96 % (08/03 0700) Last BM Date: 09/19/14 General: alert and oriented times 3 Heart: regular rate and rythm Abdomen: soft, non-tender, non-distended, normal bowel sounds   Lab Results:  Recent Labs  09/18/14 0630  WBC 13.2*  HGB 8.7*  PLT 236  MCV 95.1    Recent Labs  09/18/14 0630  NA 130*  K 4.0  CL 96*  CO2 28  GLUCOSE 110*  BUN 20  CREATININE 0.52  CALCIUM 8.1*    Recent Labs  09/18/14 0630  PROT 5.0*  ALBUMIN 2.0*  AST 24  ALT 27  ALKPHOS 78  BILITOT 0.3   Medications: Scheduled Meds: . sodium chloride   Intravenous Once  . amLODipine  10 mg Oral Daily  . amoxicillin-clavulanate  1 tablet Oral Q12H  . fluconazole (DIFLUCAN) IV  400 mg Intravenous Q24H  . insulin aspart  0-15 Units Subcutaneous 6 times per day  . lip balm  1 application Topical BID  . methylPREDNISolone (SOLU-MEDROL) injection  10 mg Intravenous Q24H  . metoprolol tartrate  25 mg Oral BID  . nystatin   Topical BID  . saccharomyces boulardii  250 mg Oral BID  . sodium chloride  10-40 mL Intracatheter Q12H  . sodium chloride  3 mL Intravenous Q12H  . sodium chloride  3 mL Intravenous Q12H  . sodium chloride  2 g Oral BID  . zolpidem  5 mg Oral QHS   Continuous Infusions: . sodium chloride 10 mL/hr at 09/13/14 1759  . Marland KitchenTPN (CLINIMIX-E) Adult 40 mL/hr at 09/19/14 1730   And  . fat emulsion 240 mL (09/19/14 1730)   PRN Meds:.sodium chloride, ALPRAZolam, alum & mag hydroxide-simeth, loperamide, menthol-cetylpyridinium, morphine injection, ondansetron, ondansetron **OR** ondansetron (ZOFRAN) IV, oxyCODONE,  oxyCODONE-acetaminophen, phenol, sodium chloride, sodium chloride    Assessment/Plan: 74 y.o. female with severe IBD (probable crohn's given rectal fistula, abscess), perirectal abscess, loop ileostomy about 5 days ago  Remicade #1 7/15, #2 8/2 (yesterday).  This is the medical therapy that is most likely going to heal the fistula.  She will be due for her third dose 9/1 and hopefully this will be as an outpatient.  She is eating fairly well and should be encouraged to continue that.  If she eats well for BF and lunch then can d/c the TNA.    Milus Banister, MD  09/20/2014, 9:04 AM [facility] Pager [phone]

## 2014-09-20 NOTE — Progress Notes (Signed)
[facility] NOTE  Pharmacy Consult for TPN Indication: severe IBD with fistula and abscess  Allergies  Allergen Reactions  . Clindamycin/Lincomycin Diarrhea    Leads to colitis flare  . Ivp Dye [Iodinated Diagnostic Agents] Other (See Comments)    "almost passed out"    Patient Measurements: Height: 5\' 9"  (175.3 cm) Weight: 217 lb 6 oz (98.6 kg) IBW/kg (Calculated) : 66.2 Adjusted Body Weight: 81.3kg  Vital Signs: Temp: 98.5 F (36.9 C) (08/03 0700) BP: 125/75 mmHg (08/03 0700) Pulse Rate: 84 (08/03 0700) Intake/Output from previous day: 08/02 0701 - 08/03 0700 In: 2123.3 [P.O.:305; I.V.:40; IV Piggyback:350; TPN:1428.3] Out: 4675 [Urine:4450; Stool:225] Intake/Output from this shift:    Labs:  Recent Labs  09/18/14 0630  WBC 13.2*  HGB 8.7*  HCT 27.0*  PLT 236     Recent Labs  09/18/14 0630 09/19/14 0453  NA 130*  --   K 4.0  --   CL 96*  --   CO2 28  --   GLUCOSE 110*  --   BUN 20  --   CREATININE 0.52  --   CALCIUM 8.1*  --   MG 1.5* 1.6*  PHOS 3.2  --   PROT 5.0*  --   ALBUMIN 2.0*  --   AST 24  --   ALT 27  --   ALKPHOS 78  --   BILITOT 0.3  --   PREALBUMIN 28.3  --   TRIG 130  --    Estimated Creatinine Clearance: 74.8 mL/min (by C-G formula based on Cr of 0.52).    Recent Labs  09/20/14 0016 09/20/14 0351 09/20/14 0737  GLUCAP 112* 113* 124*     Medications: Continuous Infusions: . sodium chloride 10 mL/hr at 09/13/14 1759  . Marland KitchenTPN (CLINIMIX-E) Adult 40 mL/hr at 09/19/14 1730   And  . fat emulsion 240 mL (09/19/14 1730)   Insulin Requirements:  Resistant SSI: 7 units in previous 24 hrs Insulin in TPN: 10 units/24 hrs   Current Nutrition:  Diet: Soft diet -tolerating 75-120% of tray without N/V TPN @ 40 ml/hr, Lipids @ 10 ml/hr  IVF: NS @ KVO   Central access: PICC placed 08/30/14 TPN start date:  7/20  ASSESSMENT                                                                                                           HPI:  74 y.o. female with past medical history of dyslipidemia, hypertension, long standing ulcerative colitis and recently hospitalized from 08/07/2014 through 08/21/2014 for ulcerative colitis flare.  She presented to [facility] 7/5 for readmission d/t perianal pain.  S/p I&D on 08/21/13 of the large issue rectal abscess on the right side.  Further surgery (colectomy vs colostomy) postponed due to severe colitis.  Pharmacy consulted to start TPN for severe IBD with fistula and abscess.  Significant events:  7/5 I&D of complex perirectal abscess 7/20 flex sig: severe left sided colitis consistent with Crohn's colitis; flexi-seal removed.  TPN started. 7/22: TPN titrated to goal  rate 7/27: s/p laparoscopic diverting ileostomy (TPN off for ~ 9 hours) 7/29: diet advanced to full liquid, but returned to CLD in afternoon. 7/30: Minimal intake overnight d/t abd pain.   7/31: attempting advance to fulls again 8/1: tolerating full liquid diet; begin weaning TPN 8/2: tolerating soft diet, ate 100% breakfast, received 2nd dose remicaide 8/3: Pt tolerating soft diet, ok to wean off TPN   Today:   Glucose (goal CBGs <150):  no history of diabetes, remains on once daily Solumedrol (note likely transition to PO prednisone 8/4), insulin added to TPN on 7/22, CBGs improved and at goal  Electrolytes:  Na, Cl remain low despite additional supplementation since 7/26. Mg also slightly low. All others WNL including Corrected Ca.    Renal:  SCr low, CrCl 78 ml/min  LFTs:  WNL, alb low  TGs: 128 (7/21), 109 (7/25), 130 (8/1) - stable  Prealbumin: 19.6 (7/21), 26.9 (7/25), 28.3 (8/1)- rising, possibly skewed by steroids  NUTRITIONAL GOALS                                                                                             RD recs (7/26): 1900-2100 Kcal/day, 100-110 g protein / day Clinimix E 5/15 at a goal rate of 83 ml/hr + 20% fat emulsion at 4ml/hr to provide: 100 g/day  protein, 1894 Kcal/day.   PLAN                                                                                  __________________ Will continue current bag of TPN @ 40 ml/hr (50% of goal calories) until 1800.  Then stop TPN and 20% fat emulsion as pt tolerating soft diet.    Continue NS at [facility] rate.  Stop CBGs/SSI.  Magnesium 1g IV x 1   D/C TPN labs after tomorrow  Ralene Bathe, PharmD, BCPS 09/20/2014, 8:09 AM  Pager: [phone]

## 2014-09-21 ENCOUNTER — Other Ambulatory Visit: Payer: Self-pay

## 2014-09-21 DIAGNOSIS — K51919 Ulcerative colitis, unspecified with unspecified complications: Secondary | ICD-10-CM

## 2014-09-21 DIAGNOSIS — Z932 Ileostomy status: Secondary | ICD-10-CM

## 2014-09-21 DIAGNOSIS — K51814 Other ulcerative colitis with abscess: Secondary | ICD-10-CM

## 2014-09-21 LAB — COMPREHENSIVE METABOLIC PANEL
ALT: 47 U/L (ref 14–54)
ANION GAP: 6 (ref 5–15)
AST: 31 U/L (ref 15–41)
Albumin: 2.3 g/dL — ABNORMAL LOW (ref 3.5–5.0)
Alkaline Phosphatase: 101 U/L (ref 38–126)
BILIRUBIN TOTAL: 0.3 mg/dL (ref 0.3–1.2)
BUN: 16 mg/dL (ref 6–20)
CHLORIDE: 100 mmol/L — AB (ref 101–111)
CO2: 26 mmol/L (ref 22–32)
Calcium: 8.3 mg/dL — ABNORMAL LOW (ref 8.9–10.3)
Creatinine, Ser: 0.45 mg/dL (ref 0.44–1.00)
GFR calc Af Amer: >60 mL/min (ref >60)
GFR calc non Af Amer: >60 mL/min (ref >60)
Glucose, Bld: 101 mg/dL — ABNORMAL HIGH (ref 65–99)
POTASSIUM: 4.2 mmol/L (ref 3.5–5.1)
Sodium: 132 mmol/L — ABNORMAL LOW (ref 135–145)
TOTAL PROTEIN: 5.6 g/dL — AB (ref 6.5–8.1)

## 2014-09-21 LAB — MAGNESIUM: MAGNESIUM: 1.7 mg/dL (ref 1.7–2.4)

## 2014-09-21 MED ORDER — FLUCONAZOLE 200 MG PO TABS: 400.0000 mg | ORAL_TABLET | Freq: Every day | ORAL | Status: DC

## 2014-09-21 MED ORDER — PREDNISONE 1 MG PO TABS: 8.0000 mg | ORAL_TABLET | Freq: Every day | ORAL | Status: DC

## 2014-09-21 MED ORDER — DIPHENHYDRAMINE HCL 50 MG PO CAPS: 50.0000 mg | ORAL_CAPSULE | Freq: Once | ORAL | Status: DC

## 2014-09-21 MED ORDER — BOOST / RESOURCE BREEZE PO LIQD: 1.0000 | Freq: Three times a day (TID) | ORAL | Status: DC

## 2014-09-21 MED ORDER — PREDNISONE 1 MG PO TABS: 0.5000 mg | ORAL_TABLET | Freq: Every day | ORAL | Status: DC

## 2014-09-21 MED ORDER — PREDNISONE 1 MG PO TABS: 4.0000 mg | ORAL_TABLET | Freq: Every day | ORAL | Status: DC

## 2014-09-21 MED ORDER — PREDNISONE 10 MG PO TABS: 10.0000 mg | ORAL_TABLET | Freq: Every day | ORAL | Status: DC

## 2014-09-21 MED ORDER — AMOXICILLIN-POT CLAVULANATE 875-125 MG PO TABS: 1.0000 | ORAL_TABLET | Freq: Two times a day (BID) | ORAL | Status: DC

## 2014-09-21 MED ORDER — SODIUM CHLORIDE 0.9 % IV SOLN: 5.0000 mg/kg | Freq: Once | INTRAVENOUS | Status: DC

## 2014-09-21 MED ORDER — PREDNISONE 1 MG PO TABS: 2.0000 mg | ORAL_TABLET | Freq: Every day | ORAL | Status: DC

## 2014-09-21 MED ORDER — PREDNISONE 1 MG PO TABS: 6.0000 mg | ORAL_TABLET | Freq: Every day | ORAL | Status: DC

## 2014-09-21 MED ORDER — PREDNISONE 1 MG PO TABS: 1.0000 mg | ORAL_TABLET | Freq: Every day | ORAL | Status: DC

## 2014-09-21 MED ORDER — ALPRAZOLAM 0.5 MG PO TABS: 0.5000 mg | ORAL_TABLET | Freq: Two times a day (BID) | ORAL | Status: DC | PRN

## 2014-09-21 MED ORDER — AMLODIPINE BESYLATE 10 MG PO TABS: 10.0000 mg | ORAL_TABLET | Freq: Every day | ORAL | Status: DC

## 2014-09-21 MED ORDER — SACCHAROMYCES BOULARDII 250 MG PO CAPS: 250.0000 mg | ORAL_CAPSULE | Freq: Two times a day (BID) | ORAL | Status: DC

## 2014-09-21 MED ORDER — OXYCODONE-ACETAMINOPHEN 5-325 MG PO TABS: 1.0000 | ORAL_TABLET | ORAL | Status: DC | PRN

## 2014-09-21 MED ORDER — ACETAMINOPHEN 325 MG PO TABS: 650.0000 mg | ORAL_TABLET | Freq: Once | ORAL | Status: DC

## 2014-09-21 NOTE — Progress Notes (Signed)
Patient ID: [name], female   DOB: 12-05-1940, 74 y.o.   MRN: [id number] 8 Days Post-Op  Subjective: Pt feels ok today.  No complaints.  She is hungry and waiting on breakfast to come.  Objective: Vital signs in last 24 hours: Temp:  [98.1 F (36.7 C)-98.7 F (37.1 C)] 98.5 F (36.9 C) (08/04 0500) Pulse Rate:  [71-79] 79 (08/04 0500) Resp:  [16-18] 18 (08/04 0500) BP: (115-136)/(62-66) 115/64 mmHg (08/04 0500) SpO2:  [96 %-100 %] 100 % (08/04 0500) Weight:  [98.8 kg (217 lb 13 oz)] 98.8 kg (217 lb 13 oz) (08/04 0500) Last BM Date: 09/20/14  Intake/Output from previous day: 08/03 0701 - 08/04 0700 In: 290 [P.O.:290] Out: 1875 [Urine:1625; Stool:250] Intake/Output this shift:    PE: Abd: soft, ND, essentially NT, ileostomy working well with thicker output and air present Rectal: wound appears relatively clean right now.  Gauze removed and superior portion of wound evaluated.  Gauze was replaced.  Lab Results:  No results for input(s): WBC, HGB, HCT, PLT in the last 72 hours. BMET  Recent Labs  09/20/14 0835 09/21/14 0530  NA 130* 132*  K 4.1 4.2  CL 98* 100*  CO2 25 26  GLUCOSE 150* 101*  BUN 16 16  CREATININE 0.46 0.45  CALCIUM 7.9* 8.3*   PT/INR No results for input(s): LABPROT, INR in the last 72 hours. CMP     Component Value Date/Time   NA 132* 09/21/2014 0530   K 4.2 09/21/2014 0530   CL 100* 09/21/2014 0530   CO2 26 09/21/2014 0530   GLUCOSE 101* 09/21/2014 0530   BUN 16 09/21/2014 0530   CREATININE 0.45 09/21/2014 0530   CREATININE 1.08 03/28/2014 1022   CALCIUM 8.3* 09/21/2014 0530   PROT 5.6* 09/21/2014 0530   ALBUMIN 2.3* 09/21/2014 0530   AST 31 09/21/2014 0530   ALT 47 09/21/2014 0530   ALKPHOS 101 09/21/2014 0530   BILITOT 0.3 09/21/2014 0530   GFRNONAA >60 09/21/2014 0530   GFRNONAA 50* 03/28/2014 1022   GFRAA >60 09/21/2014 0530   GFRAA 58* 03/28/2014 1022   Lipase     Component Value Date/Time   LIPASE 13* 08/07/2014 1201        Studies/Results: No results found.  Anti-infectives: Anti-infectives    Start     Dose/Rate Route Frequency Ordered Stop   09/19/14 1600  amoxicillin-clavulanate (AUGMENTIN) 875-125 MG per tablet 1 tablet     1 tablet Oral Every 12 hours 09/19/14 1223     09/11/14 1600  piperacillin-tazobactam (ZOSYN) IVPB 3.375 g  Status:  Discontinued     3.375 g 12.5 mL/hr over 240 Minutes Intravenous 3 times per day 09/11/14 1523 09/19/14 1223   09/07/14 1200  fluconazole (DIFLUCAN) IVPB 400 mg     400 mg 100 mL/hr over 120 Minutes Intravenous Every 24 hours 09/06/14 2117     09/06/14 2200  metroNIDAZOLE (FLAGYL) IVPB 250 mg  Status:  Discontinued     250 mg 50 mL/hr over 60 Minutes Intravenous Every 8 hours 09/06/14 2117 09/12/14 1025   09/05/14 1500  metroNIDAZOLE (FLAGYL) tablet 250 mg  Status:  Discontinued     250 mg Oral 3 times per day 09/05/14 1440 09/06/14 2117   08/28/14 0000  fluconazole (DIFLUCAN) 200 MG tablet     400 mg Oral Daily 08/28/14 1052     08/28/14 0000  amoxicillin-clavulanate (AUGMENTIN) 875-125 MG per tablet     1 tablet Oral 2 times daily 08/28/14  1052     08/25/14 2000  vancomycin (VANCOCIN) IVPB 1000 mg/200 mL premix  Status:  Discontinued     1,000 mg 200 mL/hr over 60 Minutes Intravenous Every 12 hours 08/25/14 1009 08/26/14 0949   08/25/14 1200  fluconazole (DIFLUCAN) tablet 400 mg  Status:  Discontinued     400 mg Oral Daily 08/25/14 0943 09/06/14 2117   08/23/14 1000  vancomycin (VANCOCIN) IVPB 750 mg/150 ml premix  Status:  Discontinued     750 mg 150 mL/hr over 60 Minutes Intravenous Every 12 hours 08/22/14 2127 08/25/14 1009   08/23/14 0600  piperacillin-tazobactam (ZOSYN) IVPB 3.375 g  Status:  Discontinued     3.375 g 12.5 mL/hr over 240 Minutes Intravenous 3 times per day 08/22/14 2124 09/05/14 1356   08/22/14 2130  piperacillin-tazobactam (ZOSYN) IVPB 3.375 g     3.375 g 100 mL/hr over 30 Minutes Intravenous NOW 08/22/14 2124 08/22/14 2220    08/22/14 2130  [MAR Hold]  vancomycin (VANCOCIN) 2,000 mg in sodium chloride 0.9 % 500 mL IVPB     (MAR Hold since 08/22/14 2338)   2,000 mg 250 mL/hr over 120 Minutes Intravenous NOW 08/22/14 2127 08/23/14 0008       Assessment/Plan S/p Laparoscopic diverting ileostomy, exam under anesthesia and debridement of skin at rectum, 09/13/14, DR. 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  Resource breeze added.  TNA stopped -cont routine ileostomy care -cont BID dressing changes and irrigation with toomey syringe BID.  Patient does not qualify for LTAC.  Patient will likely be DC to SNF. -follow up with Dr. Barry Young in 2-3 weeks as outpatient for follow up.   LOS: 30 days    Cheria Sadiq Young 09/21/2014, 8:36 AM Pager: [phone]

## 2014-09-21 NOTE — Care Management Important Message (Signed)
Important Message  Patient Details  Name: VIRIGINIA AMENDOLA MRN: [id number] Date of Birth: 16-Jul-1940   Medicare Important Message Given:  Yes-fourth notification given    Camillo Flaming 09/21/2014, 1:50 PMImportant Message  Patient Details  Name: CARALEE MOREA MRN: [id number] Date of Birth: 01-17-1941   Medicare Important Message Given:  Yes-fourth notification given    Camillo Flaming 09/21/2014, 1:50 PM

## 2014-09-21 NOTE — Progress Notes (Signed)
Nutrition Follow-up  DOCUMENTATION CODES:   Obesity unspecified  INTERVENTION:  - Continue Boost Breeze TID - RD will continue to monitor for needs, need to switch from Breeze to Ensure  NUTRITION DIAGNOSIS:   Inadequate oral intake related to poor appetite as evidenced by estimated needs. -appetite beginning to return for pt previously on TPN.  GOAL:   Patient will meet greater than or equal to 90% of their needs -variably met at this time  MONITOR:   Weight trends, Labs, I & O's, Other (Comment) (TPN regimen)  ASSESSMENT:  74 y.o. female with past medical history of dyslipidemia, hypertension, long standing ulcerative colitis and recently hospitalized from 08/07/2014 through 08/21/2014 for ulcerative colitis flare. She had flexible sigmoidoscopy 08/08/2014 which demonstrated inflammatory changes from the rectum consistent with ulcerative colitis. She presented to [facility] with perianal pain started the night prior to the admission. CT scan on the admission demonstrated a complex perirectal infection extending up toward the retroperitoneal area.  8/4 TPN was d/c yesterday after bag of Clinimix E 5/15 @ 40 mL/hr was emptied. Pt on Regular diet and Boost Breeze was ordered TID this AM. Today is last day for 48 hour kcal count follow-up. Needs have been re-estimated given length of post-op days.   Breakfast 8/4: 100% 4 ounces orange juice, 1 piece bacon, 50% of a biscuit (157 kcal, 4.8 grams protein) Lunch 8/3: 90% canned peaches, 50% grilled cheese sandwich, 75% [place] sausage patty (282 kcal, 12.3 grams protein) Dinner 8/3: 100% margarine pad, 25% chocolate pudding, 90% balsamic chicken breast, 90% whipped potatoes, 90% carrots (516 kcal, 28 grams protein) Supplements: Boost Breeze TID, no intakes of supplement recorded  Total intake: 955 kcal (58% of minimum estimated needs)  45 protein (64% of minimum estimated needs)  Pt not fully meeting needs as appetite  continues to return. Medications reviewed. Labs reviewed; CBGs: 112-189 mg/dL, Na: 132 mmol/L, Ca: 8.3 mg/dL, Cl: 100 mmol/L.    8/1 New consult for pt who is s/p ileostomy, plan to wean TPN, and need to monitor liquid intakes. Recommend that RNs/tech record fluid intakes each shift and record. Pt on FLD and now advanced to Soft diet following breakfast this AM; no intakes recorded. Pt only shakes/nods head to questions and indicates that she did have breakfast this AM (FLD). No family in the room but left Ileostomy Nutrition Therapy handout from [employer] in the room for family use upon d/c.  Plan today per pharmacy: At 1800 today:  Decrease Clinimix E 5/15 to 75% of goal (22m/hr).   Decrease insulin in TPN to 10 units/24 hr   TPN to contain standard multivitamins and trace elements.  Continue NaCl tabs 2g PO bid; can increase to TID if Na starts to drop again  Continue 20% fat emulsion at 10 ml/hr.  [facility]  Continue CBGs and change to moderate SSI q4h.  Magnesium 1gm IV x1 today. Recheck Magnesium level in am.  7/29 - Pt's diet advanced to CLD yesterday afternoon with good tolerance and was, therefore, advanced again this AM to FLD - Pt continues with TPN. Per pharmacy note this AM: At 1800 today:  continue Clinimix E 5/15 at goal rate of 83 ml/hr.  Add regular insulin, 45 units/24 hours.  Add 42 mEq/L NaCl to make TPN concentration 0.45%NS.-will not increase NaCl due to advancement to full liquid diet today  TPN to contain standard multivitamins and trace elements.  Continue 20% fat emulsion at  10 ml/hr.  Plan on wean tomorrow if tolerating fulls per surgery TPN is providing 1894 kcal, 100 grams of protein.  7/26 - Pt has been NPO since midnight pending diagnostic laparoscopy with diverting loop ileostomy and possible EUA today - Pt continues with Clinimix E 5/15 @ 83 mL/hr and 20% lipids @ 10 mL/hr which is providing  1894 kcal, 100 grams protein - Meeting needs with TPN alone; will monitor for diet advancement following surgery - Medications reviewed. Labs reviewed; CBGs: 148-208 mg/dL, Na: 126 mmol/L, Cl: 94 mmol/L, BUN elevated, Ca: 7.4 mg/dL.  7/22 Pt currently receiving Clinimix E 5/15 @ 60 ml/hr with 20% lipids @ 10 mL/hr which is providing 1502 kcal, 72 grams of protein; not meeting needs.  Plan per pharmacy for today: At 1800 today:  Increase Clinimix E 5/15 to 83 ml/hr  Continue 20% fat emulsion at 43m/hr.  TPN to contain standard multivitamins and trace elements.  Provide insulin 15 units/ 24hrs via TPN  7/21 On 7/5 pt underwent I&D of large, R-sided rectal abscess and on 7/20 underwent flex sig which indicated significant L sided disease. Per [facility]RN note 7/20, pt to undergo colostomy placement today (7/21).    Diet Order:  Diet - low sodium heart healthy Diet regular Room service appropriate?: Yes; Fluid consistency:: Thin  Skin:  Wound (see comment) (perineum surgical incision)  Last BM:  8/4  Height:   Ht Readings from Last 1 Encounters:  08/22/14 5' 9"  (1.753 m)    Weight:   Wt Readings from Last 1 Encounters:  09/21/14 217 lb 13 oz (98.8 kg)    Ideal Body Weight:  72.73 kg (kg)  BMI:  Body mass index is 32.15 kg/(m^2).  Estimated Nutritional Needs:   Kcal:  1650-1850  Protein:  70-80 grams  Fluid:  2.2-2.5 L/day  EDUCATION NEEDS:   No education needs identified at this time     JJarome Matin RD, LDN Inpatient Clinical Dietitian Pager # [phone] hours/weekend pager # [phone]

## 2014-09-21 NOTE — Progress Notes (Signed)
Patient ID: [name], female   DOB: March 08, 1940, 74 y.o.   MRN: [id number]    Progress Note   Subjective   Up in chair, says feeling pretty good, no significant abdominal pain, and says rectal area less painful ID consult reviewed,anticpating another 2-3 weeks of antibiotics Tolerating regular diet, TNA stopped   Objective   Vital signs in last 24 hours: Temp:  [98.1 F (36.7 C)-98.7 F (37.1 C)] 98.5 F (36.9 C) (08/04 0500) Pulse Rate:  [71-79] 79 (08/04 0500) Resp:  [16-18] 18 (08/04 0500) BP: (115-136)/(62-66) 115/64 mmHg (08/04 0500) SpO2:  [96 %-100 %] 100 % (08/04 0500) Weight:  [217 lb 13 oz (98.8 kg)] 217 lb 13 oz (98.8 kg) (08/04 0500) Last BM Date: 09/20/14 General:   Older AA female  in NAD Heart:  Regular rate and rhythm; no murmurs Lungs: Respirations even and unlabored, lungs CTA bilaterally Abdomen:  Soft, nontender and nondistended. Normal bowel sounds,ileostomy functioning well. Extremities:  Without edema. Neurologic:  Alert and oriented,  grossly normal neurologically. Psych:  Cooperative. Normal mood and affect.  Intake/Output from previous day: 08/03 0701 - 08/04 0700 In: 290 [P.O.:290] Out: 1875 [Urine:1625; Stool:250] Intake/Output this shift:    Lab Results: No results for input(s): WBC, HGB, HCT, PLT in the last 72 hours. BMET  Recent Labs  09/20/14 0835 09/21/14 0530  NA 130* 132*  K 4.1 4.2  CL 98* 100*  CO2 25 26  GLUCOSE 150* 101*  BUN 16 16  CREATININE 0.46 0.45  CALCIUM 7.9* 8.3*   LFT  Recent Labs  09/21/14 0530  PROT 5.6*  ALBUMIN 2.3*  AST 31  ALT 47  ALKPHOS 101  BILITOT 0.3   PT/INR No results for input(s): LABPROT, INR in the last 72 hours.  Studies/Results: No results found.     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 Had previously been labeled as UC , but disease now behaving as Crohns .Improving She has had first 2  doses of induction Remicade and will need 3rd infusion  September 2 , then start q 8 weeks Using Remicade as monotherapy currently Antibiotics as per ID Pt has a follow up appt with Dr Deatra Ina on 8/22 at 10:15am-will need follow up Ct scan at that time as well  Principal Problem:   Abscess, perirectal s/p I&D 08/23/2014 Active Problems:   Essential hypertension   Inflammatory bowel disease (IBD) with colitis   Sepsis   Infection due to yeast   Acute blood loss anemia   Leukocytosis   Hypokalemia   Hyponatremia   General weakness   Thrombocytopenia   Ulcerative colitis   DVT (deep venous thrombosis)   DVT (deep vein thrombosis) in pregnancy   Cold   Fasciitis     LOS: 30 days   Amy [name]  09/21/2014, 9:04 AM  ________________________________________________________________________  [name] note:  I personally examined the patient, reviewed the data and agree with the assessment and plan described above.  Will be a long recovery.  Next remicade infusion should be around 10/20/14.  That is very important for her.  She is already scheduled to meet with Dr. Deatra Ina in office in 2 weeks but if for some reason she cannot make that appt GI needs to be contacted to ensure that she is still on schedule for remicade.    Please call or page with any further questions or  concerns.    [name], MD [facility] Pager [phone]

## 2014-09-21 NOTE — Progress Notes (Signed)
Clinical Social Work  Patient was discussed during progression meeting and MD reports that patient is medically stable to DC. CSW spoke with patient and dtr (Ramona) who have chosen [facility] for SNF. CSW spoke with Stanton Kidney at [facility] in order to obtain authorization for patient for SNF. [facility] is ready to accept once DC summary completed and auth obtained. CSW will continue to follow.  [place], [place] [phone]

## 2014-09-21 NOTE — Discharge Summary (Signed)
[name], is a 74 y.o. female  DOB Jan 08, 1941  MRN [id number].  Admission date:  08/22/2014  Admitting Physician  Lavina Hamman, MD  Discharge Date:  09/21/2014   Primary MD  Kandice Hams, MD  Recommendations for primary care physician for things to follow:  - Please check CBC, BMP during next visit - Patient will need to follow with GI as an outpatient, they will arrange for CT abdomen pelvis to follow on her fistula. - Patient will need to follow with surgery as an outpatient on scheduled appointment. - She is on steroids taper - Patient will need Remicade on 9/1, to be arranged by GI - Foley catheter was not discontinued secondary to her open wound, once patient is more ambulatory discontinue Foley catheter.   Admission Diagnosis  Abscess, perirectal [K61.1] Fasciitis [M72.9]   Discharge Diagnosis  Abscess, perirectal [K61.1] Fasciitis [M72.9]   Principal Problem:   Abscess, perirectal s/p I&D 08/23/2014 Active Problems:   Essential hypertension   Inflammatory bowel disease (IBD) with colitis   Sepsis   Infection due to yeast   Acute blood loss anemia   Leukocytosis   Hypokalemia   Hyponatremia   General weakness   Thrombocytopenia   Ulcerative colitis   DVT (deep venous thrombosis)   DVT (deep vein thrombosis) in pregnancy   Cold   Fasciitis      Past Medical History  Diagnosis Date  . CAD (coronary artery disease)     unspecified  . Cerebrovascular disease   . Hyperlipidemia, mixed   . HTN (hypertension)   . Ulcerative colitis   . Diverticulitis   . Colon polyps 2006  . Thyroid disease   . Myocardial infarct   . Carotid artery occlusion 2010  . Diabetes mellitus without complication     Borderline  . GERD (gastroesophageal reflux disease)   . Arthritis     Past Surgical History  Procedure Laterality Date  . Cholecystectomy  7.06.2008    with cholangiogram  .  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 Abdominal hysterectomy    . Colonoscopy  multiple  . Sigmoidoscopy  multiple  . Flexible sigmoidoscopy N/A 08/08/2014    Procedure: FLEXIBLE SIGMOIDOSCOPY;  Surgeon: Gatha Mayer, MD;  Location: [facility];  Service: Endoscopy;  Laterality: N/A;  . Incision and drainage perirectal abscess N/A 08/22/2014    Procedure: IRRIGATION AND DEBRIDEMENT PERIRECTAL ABSCESS;  Surgeon: Jackolyn Confer, MD;  Location: [facility];  Service: General;  Laterality: N/A;  . Joint replacement  left knee  . Flexible sigmoidoscopy N/A 09/06/2014    Procedure: FLEXIBLE SIGMOIDOSCOPY;  Surgeon: Lafayette Dragon, MD;  Location: [facility];  Service: Endoscopy;  Laterality: N/A;  . Laparoscopic diverted colostomy N/A 09/13/2014    Procedure: LAPAROSCOPIC DIVERTED ILEOSTOMY;  Surgeon: Stark Klein, MD;  Location: [facility];  Service: General;  Laterality: N/A;  . Rectal exam under anesthesia N/A 09/13/2014    Procedure: RECTAL EXAM UNDER ANESTHESIA  AND DEBRIDEMENT OF PERIANAL WOUND;  Surgeon: Stark Klein,  MD;  Location: [facility];  Service: General;  Laterality: N/A;       History of present illness and  Hospital Course:     Kindly see H&P for history of present illness and admission details, please review complete Labs, Consult reports and Test reports for all details in brief  HPI  from the history and physical done on the day of admission  08/22/2014  AILEA RHATIGAN is a 74 y.o. female with Past medical history of ulcerative colitis with recent admission for flareup, coronary artery disease, CVA, dyslipidemia, hypothyroidism, GERD. The patient presents with complaints of abdominal pain. She also has GI bleed with bright red blood per rectum. This has been ongoing for a while and she was recently hospitalized for 2 weeks for the same complaint. She completed a course of antibiotics and was discharged on oral prednisone. With recurrent admission a CT of the abdomen was performed and  after discussing with general surgery hospitalist was requested to admit the patient. At the time of my evaluation the patient was seen postoperatively and did not have any acute complaint of her abdominal pain improved significantly.   The patient is coming from home.  At her baseline ambulates without any support and walker  And is independent for most of her ADL manages her medication on her own.  Hospital Course  74 y.o. female with past medical history of dyslipidemia, hypertension, long standing ulcerative colitis and recently hospitalized from 08/07/2014 through 08/21/2014 for ulcerative colitis flare. She had flexible sigmoidoscopy 08/08/2014 which demonstrated inflammatory changes from the rectum consistent with ulcerative colitis. She presented to [facility] with perianal pain started the night prior to the admission. CT scan on the admission demonstrated a complex perirectal infection extending up toward the retroperitoneal area.  On 08/21/13 , patient underwent incision and drainage of the large issue rectal abscess on the right side. She was started on vancomycin and Zosyn. Postprocedure she was doing fine but has developed large amount of bleed on the morning of 08/23/2014. GI was consulted and pt was started on prednisone and mesalamine.  Hospital course has been complicated with ongoing rectal bleed so Additionally, she was found to have left LE DVT On venous doppler done 7/12.   On 7/20, pt underwent flex sigmoidoscopy with findings of significant Lt sided disease with GI recs for continued bowel rest, PO flagyl, increased steroids, and Remicade. After several days of bowel rest, pt's WBC rose and pt's steroids were tapered for infection to subside. Patient taken to OR by surgery for laproscopic diverting ileostomy on 7/27.  Hospital course prolonged due to slow improvement with ongoing requirement for TNA, deconditioning and  Sepsis secondary to perirectal  abscess Incision and drainage by surgery on 08/23/2014. Blood cultures negative. Abscess cultures showed rare yeast and multiple organisms.  -Diverting ileostomy on 7/27. 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 Ileostomy functioning well. -tolerating soft diet , with good intake, weaned off TPN.   Antibiotic course: Recommended by ID for a total 14 day duration of antibiotic (was continued on fluconazole and Flagyl) with stop date on 7/18, however due to stool in abscess cavity and possible fistula antibiotic has been prolonged. Zosyn added due to worsening WBC and encephalopathy. Flagyl Discontinued. Given persistent open fistula wound over her buttocks may need further course of antibiotics. Seen by ID Dr. Graylon Good , will  need another 2-3 weeks off oral Augmentin and fluconazole, plan is to repeat CT abdomen pelvis to ensure resolution of fistula and abscess. Her to make decisions about antibiotics length of treatment.   Rectal bleed secondary to ulcerative colitis with acute blood loss anemia -Patient started on prednisone and mesalamine which was unable to control rectal bleed and thus started on Remicade. Steroid being tapered due to concern for persistent abscess. Will transition to oral prednisone from tomorrow. -Received a total of 3 u PRBC since admission. (Hemoglobin 12 on admission which has dropped to 7-8. Slowly improving. ) Leukocytosis improving. Still having rectal bleed with stool from the fistula -GI following. Patient received Remicade on 7/15. Given another dose 8/2 as the medical therapy most likely going to heal her fistula, So on 9/1. -Foley continued due to open wound.    Bilateral lower extremity DVT Not a candidate for anticoagulation due to ongoing rectal bleed and uncontrolled ulcerative colitis. IVC filter placed during this  hospitalization.    Essential hypertension Resumed home dose metoprolol. Continue when necessary hydralazine.  Hypokalemia/hyponatremia Likely secondary to GI loss. . electrolytes being adjusted with TPN. Still has mild hyponatremia.   Generalized weakness needs skilled nursing facility.  Anxiety and insomnia Improved with  PRN anxiety meds.and bedtime Ambien.  Protein calorie malnutrition and deconditioning Seen by nutritionist, tolerates soft diet    Consultants:  [facility]  GI  ID  Procedures:  Flexible sigmoidoscopy on 7/20  Doppler lower extremity  IVC filter  PICC line  laproscopic diverting ileostomy on 7/27    Discharge Condition:  stable   Follow UP  Follow-up Information    Follow up with POLITE,RONALD D, MD. Schedule an appointment as soon as possible for a visit in 1 week.   Specialty:  Internal Medicine   Why:  Follow up appt after recent hospitalization   Contact information:   [address] [place] [address] [phone]       Follow up with Stark Klein, MD On 10/09/2014.   Specialty:  General Surgery   Why:  11:15am, arrive no later than 10:50am to fill out paperwork   Contact information:   [place] [address] [phone]       Follow up with Erskine Emery, MD On 10/09/2014.   Specialty:  Gastroenterology   Why:  at 10:15 am   Contact information:   [address] [place] [address] [phone]         Discharge Instructions  and  Discharge Medications         Discharge Instructions    Call MD for:  difficulty breathing, headache or visual disturbances    Complete by:  As directed      Call MD for:  persistant nausea and vomiting    Complete by:  As directed      Call MD for:  severe uncontrolled pain    Complete by:  As directed      Diet - low sodium heart healthy    Complete by:  As directed      Diet - low sodium heart healthy    Complete by:  As  directed      Discharge instructions    Complete by:  As directed   1. Continue fluconazole and Augmentin for 21days on discharge for treatment of rectal abscess 2. Follow-up with surgery per scheduled appointment.     Discharge instructions    Complete by:  As directed  Continue with heart healthy diet, nutritional supplement. - Continue with wound care as instructed. - Follow with prescheduled surgical and GI appointment. - Continue with oral fluconazole and Augmentin for the next 14-21 days(duration to be determined after CT abdomen pelvis with IV contrast to be arranged by GI as an outpatient)     Increase activity slowly    Complete by:  As directed      Increase activity slowly    Complete by:  As directed             Medication List    STOP taking these medications        diphenoxylate-atropine 2.5-0.025 MG per tablet  Commonly known as:  LOMOTIL     hydrochlorothiazide 12.5 MG capsule  Commonly known as:  MICROZIDE      TAKE these medications        acetaminophen 500 MG tablet  Commonly known as:  TYLENOL  Take 2 tablets (1,000 mg total) by mouth every 4 (four) hours as needed for mild pain or moderate pain.     ALPRAZolam 0.5 MG tablet  Commonly known as:  XANAX  Take 1 tablet (0.5 mg total) by mouth 2 (two) times daily as needed for anxiety.     amLODipine 10 MG tablet  Commonly known as:  NORVASC  Take 1 tablet (10 mg total) by mouth daily.     amoxicillin-clavulanate 875-125 MG per tablet  Commonly known as:  AUGMENTIN  Take 1 tablet by mouth 2 (two) times daily. Patient will need to continue with oral Augmentin until she is seen in GI in 2 weeks, so continue for 3 weeks.     CRESTOR 40 MG tablet  Generic drug:  rosuvastatin  TAKE 1 TABLET AT BEDTIME     feeding supplement Liqd  Take 1 Container by mouth 3 (three) times daily between meals.     fluconazole 200 MG tablet  Commonly known as:  DIFLUCAN  Take 2 tablets (400 mg total) by mouth daily.  Patient will need to continue with oral Augmentin until she is seen in GI in 2 weeks, so continue for 3 weeks.     mesalamine 1.2 G EC tablet  Commonly known as:  LIALDA  take 4 tablets by mouth once daily     metoprolol tartrate 25 MG tablet  Commonly known as:  LOPRESSOR  take 1 tablet by mouth twice a day     oxyCODONE 5 MG immediate release tablet  Commonly known as:  Oxy IR/ROXICODONE  Take 1 tablet (5 mg total) by mouth every 4 (four) hours as needed for moderate pain, severe pain or breakthrough pain.     predniSONE 10 MG tablet  Commonly known as:  DELTASONE  Take 1 tablet (10 mg total) by mouth daily with breakfast. For 3 doses     predniSONE 1 MG tablet  Commonly known as:  DELTASONE  Take 8 tablets (8 mg total) by mouth daily with breakfast. For 3 doses  Start taking on:  09/24/2014     predniSONE 1 MG tablet  Commonly known as:  DELTASONE  Take 6 tablets (6 mg total) by mouth daily with breakfast. For 3 doses  Start taking on:  09/27/2014     predniSONE 1 MG tablet  Commonly known as:  DELTASONE  Take 4 tablets (4 mg total) by mouth daily with breakfast. For 3 doses  Start taking on:  09/30/2014     predniSONE 1 MG tablet  Commonly  known as:  DELTASONE  Take 2 tablets (2 mg total) by mouth daily with breakfast. For 3 doses  Start taking on:  10/03/2014     predniSONE 1 MG tablet  Commonly known as:  DELTASONE  Take 1 tablet (1 mg total) by mouth daily with breakfast. For 3 doses  Start taking on:  10/06/2014     predniSONE 1 MG tablet  Commonly known as:  DELTASONE  Take 0.5 tablets (0.5 mg total) by mouth daily with breakfast. For 3 doses  Start taking on:  10/09/2014     psyllium 95 % Pack  Commonly known as:  HYDROCIL/METAMUCIL  Take 1 packet by mouth 2 (two) times daily.     saccharomyces boulardii 250 MG capsule  Commonly known as:  FLORASTOR  Take 1 capsule (250 mg total) by mouth 2 (two) times daily.          Diet and Activity recommendation:  See Discharge Instructions above        Ct Abdomen Pelvis W Contrast  08/22/2014   CLINICAL DATA:  Patient with abdominal pain and rectal bleeding beginning this morning. Patient with recent ulcerative colitis flare up.  EXAM: CT ABDOMEN AND PELVIS WITH CONTRAST  TECHNIQUE: Multidetector CT imaging of the abdomen and pelvis was performed using the standard protocol following bolus administration of intravenous contrast.  CONTRAST:  147m OMNIPAQUE IOHEXOL 300 MG/ML  SOLN  COMPARISON:  CT 08/20/2006.  FINDINGS: Lower chest: Dependent atelectasis left lower lobe. Normal heart size. Small hiatal hernia.  Hepatobiliary: Liver is normal in size and contour without focal hepatic lesion identified. Patient status post cholecystectomy.  Pancreas: Unremarkable  Spleen: Unremarkable  Adrenals/Urinary Tract: Stable thickening left adrenal gland. 2.2 cm left renal cyst. Additional tiny bilateral lower attenuation renal lesions too small to characterize.  Stomach/Bowel: There is wall thickening at the level of the rectum. Additionally, best demonstrated on the coronal images (image 63; series 5) there is suggestion of probable perforation and adjacent small perirectal abscess. Wall thickening of the distal transverse and descending colon. There is an extensive amount of gas within the right gluteal soft tissues at the level of the rectum extending to the gluteal musculature and cranially throughout the retroperitoneum.  Vascular/Lymphatic: Extensive calcified atherosclerotic plaque involving the abdominal aorta. Infrarenal abdominal aortic ectasia, 2.2 cm. No retroperitoneal lymphadenopathy.  Other: Catheter is present within the vagina. Contrast demonstrated within the vagina.  Musculoskeletal: Lower lumbar spine degenerative changes. No aggressive or acute appearing osseous lesions. Extensive amount of soft tissue gas within the right gluteal subcutaneous fat extending into the gluteal musculature. Additionally this  gas extends cranially along the retroperitoneum. Soft tissue thickening of the right subcutaneous gluteal soft tissues.  IMPRESSION: Extensive soft tissue gas within the right gluteal fat extending into the right gluteal musculature as well as extending cranially throughout the retroperitoneum. Findings are concerning for an aggressive infectious process in the setting of retroperitoneal fasciitis. Potential causative etiology may be distal colitis with perforation and associated abscess formation. Recommend surgical consultation and broad-spectrum antibiotic therapy.  Wall thickening of the distal transverse and descending colon likely secondary to colitis.  Critical Value/emergent results were called by telephone at the time of interpretation on 08/22/2014 at 8:57 pm to [facility] , who verbally acknowledged these results.   Electronically Signed   By: DLovey NewcomerM.D.   On: 08/22/2014 21:09   Ir Ivc Filter Plmt / S&i /img Guid/mod Sed  08/30/2014   INDICATION: Poor venous access. In  need of intravenous access for medication administration and blood draws.  Patient with complex past medical history significant for CAD, cerebrovascular disease, DM, dyslipidemia, hypertension, ulcerative colitis and recent hospitalization from 08/07/2014 through 08/21/2014 for ulcerative colitis flare who presented to [facility] with perianal pain which started the night prior to the admission. CT scan on admission demonstrated a complex perirectal infection extending up toward the retroperitoneal area. She underwent I&D of the rectal abscess on 08/22/2014 but has since had issues with persistent rectal bleeding.  Patient developed a left lower extremity pain and swelling for which a left lower extremity venous Doppler ultrasound was performed on 08/29/2014 and demonstrating a left peroneal and proximal calf DVT.  As such, request now received for IVC filter placement due to anticoagulation contraindication.   EXAM: 1. ULTRASOUND GUIDANCE FOR VASCULAR ACCESS 2. IVC CATHETERIZATION AND VENOGRAM 3. IVC FILTER INSERTION 4. ULTRASOUND FLUOROSCOPIC GUIDED RIGHT BRACHIAL VEIN PICC LINE PLACEMENT  COMPARISON:  CT the abdomen and pelvis - 08/22/2014  MEDICATIONS: Fentanyl 50 mcg IV; Versed 2 mg IV  ANESTHESIA/SEDATION: Sedation Time  32 minutes  CONTRAST:  None. The inferior vena cavagram was performed with CO2 given history of contrast allergy  FLUOROSCOPY TIME:  1 minute (539 mGy)  COMPLICATIONS: None immediate  PROCEDURE: Informed consent was obtained from the patient following explanation of the procedure, risks, benefits and alternatives for both IVC filter placement as well as PICC line placement. The patient understands, agrees and consents for both procedures. All questions were addressed. A time out was performed prior to the initiation of the procedure.  The right upper extremity was prepped with chlorhexidine in a sterile fashion, and a sterile drape was applied covering the operative field. Maximum barrier sterile technique with sterile gowns and gloves were used for the procedure. A timeout was performed prior to the initiation of the procedure. Local anesthesia was provided with 1% lidocaine.  Under direct ultrasound guidance, the right brachial vein was accessed with a micropuncture kit after the overlying soft tissues were anesthetized with 1% lidocaine. Incidental note was made of a proximal bifurcation of the brachial artery below the level of the elbow joint. An ultrasound image was saved for documentation purposes. A guidewire was advanced to the level of the superior caval-atrial junction for measurement purposes and the PICC line was cut to length. A peel-away sheath was placed and a 39 cm, 5 [place], dual lumen was inserted to level of the superior caval-atrial junction. A post procedure spot fluoroscopic was obtained. The catheter easily aspirated and flushed and was sutured in place. A dressing was placed.  The patient tolerated the procedure well without immediate post procedural complication.  Attention was now paid towards placement of the IVC filter.  Again, maximal barrier sterile technique utilized including caps, mask, sterile gowns, sterile gloves, large sterile drape, hand hygiene, and Betadine prep.  Under sterile condition and local anesthesia, right internal jugular venous access was performed with ultrasound. An ultrasound image was saved and sent to PACS. Over a guidewire, the IVC filter delivery sheath and inner dilator were advanced into the IVC just above the IVC bifurcation. CO2 injection was performed for an IVC venogram.  Through the delivery sheath, a retrievable Denali IVC filter was deployed below the level of the renal veins and above the IVC bifurcation. Limited post deployment venacavagram was performed.  The delivery sheath was removed and hemostasis was obtained with manual compression. A dressing was placed.  The patient tolerated the above procedures well  without immediate post procedural complication.  FINDINGS: After PICC line placement, the tip terminates within the superior cavoatrial junction.  The IVC is patent. No evidence of thrombus, stenosis, or occlusion. No variant venous anatomy. Successful placement of the IVC filter below the level of the renal veins.  IMPRESSION: 1. Successful placement of a right brachial vein approach dual lumen PICC line with tip terminating within the superior cavoatrial junction. The PICC line is ready for immediate use. 2. Successful ultrasound and fluoroscopically guided placement of an infrarenal retrievable IVC filter via right jugular approach.  PLAN: Due to patient related comorbidities and/or clinical necessity, this IVC filter should be considered a permanent device. This patient will not be actively followed for future filter retrieval.   Electronically Signed   By: Sandi Mariscal M.D.   On: 08/30/2014 14:14   Ir Fluoro Guide Cv Line  Right  08/30/2014   INDICATION: Poor venous access. In need of intravenous access for medication administration and blood draws.  Patient with complex past medical history significant for CAD, cerebrovascular disease, DM, dyslipidemia, hypertension, ulcerative colitis and recent hospitalization from 08/07/2014 through 08/21/2014 for ulcerative colitis flare who presented to [facility] with perianal pain which started the night prior to the admission. CT scan on admission demonstrated a complex perirectal infection extending up toward the retroperitoneal area. She underwent I&D of the rectal abscess on 08/22/2014 but has since had issues with persistent rectal bleeding.  Patient developed a left lower extremity pain and swelling for which a left lower extremity venous Doppler ultrasound was performed on 08/29/2014 and demonstrating a left peroneal and proximal calf DVT.  As such, request now received for IVC filter placement due to anticoagulation contraindication.  EXAM: 1. ULTRASOUND GUIDANCE FOR VASCULAR ACCESS 2. IVC CATHETERIZATION AND VENOGRAM 3. IVC FILTER INSERTION 4. ULTRASOUND FLUOROSCOPIC GUIDED RIGHT BRACHIAL VEIN PICC LINE PLACEMENT  COMPARISON:  CT the abdomen and pelvis - 08/22/2014  MEDICATIONS: Fentanyl 50 mcg IV; Versed 2 mg IV  ANESTHESIA/SEDATION: Sedation Time  32 minutes  CONTRAST:  None. The inferior vena cavagram was performed with CO2 given history of contrast allergy  FLUOROSCOPY TIME:  1 minute (202 mGy)  COMPLICATIONS: None immediate  PROCEDURE: Informed consent was obtained from the patient following explanation of the procedure, risks, benefits and alternatives for both IVC filter placement as well as PICC line placement. The patient understands, agrees and consents for both procedures. All questions were addressed. A time out was performed prior to the initiation of the procedure.  The right upper extremity was prepped with chlorhexidine in a sterile fashion, and a sterile  drape was applied covering the operative field. Maximum barrier sterile technique with sterile gowns and gloves were used for the procedure. A timeout was performed prior to the initiation of the procedure. Local anesthesia was provided with 1% lidocaine.  Under direct ultrasound guidance, the right brachial vein was accessed with a micropuncture kit after the overlying soft tissues were anesthetized with 1% lidocaine. Incidental note was made of a proximal bifurcation of the brachial artery below the level of the elbow joint. An ultrasound image was saved for documentation purposes. A guidewire was advanced to the level of the superior caval-atrial junction for measurement purposes and the PICC line was cut to length. A peel-away sheath was placed and a 39 cm, 5 [place], dual lumen was inserted to level of the superior caval-atrial junction. A post procedure spot fluoroscopic was obtained. The catheter easily aspirated and flushed and was sutured  in place. A dressing was placed. The patient tolerated the procedure well without immediate post procedural complication.  Attention was now paid towards placement of the IVC filter.  Again, maximal barrier sterile technique utilized including caps, mask, sterile gowns, sterile gloves, large sterile drape, hand hygiene, and Betadine prep.  Under sterile condition and local anesthesia, right internal jugular venous access was performed with ultrasound. An ultrasound image was saved and sent to PACS. Over a guidewire, the IVC filter delivery sheath and inner dilator were advanced into the IVC just above the IVC bifurcation. CO2 injection was performed for an IVC venogram.  Through the delivery sheath, a retrievable Denali IVC filter was deployed below the level of the renal veins and above the IVC bifurcation. Limited post deployment venacavagram was performed.  The delivery sheath was removed and hemostasis was obtained with manual compression. A dressing was placed.  The  patient tolerated the above procedures well without immediate post procedural complication.  FINDINGS: After PICC line placement, the tip terminates within the superior cavoatrial junction.  The IVC is patent. No evidence of thrombus, stenosis, or occlusion. No variant venous anatomy. Successful placement of the IVC filter below the level of the renal veins.  IMPRESSION: 1. Successful placement of a right brachial vein approach dual lumen PICC line with tip terminating within the superior cavoatrial junction. The PICC line is ready for immediate use. 2. Successful ultrasound and fluoroscopically guided placement of an infrarenal retrievable IVC filter via right jugular approach.  PLAN: Due to patient related comorbidities and/or clinical necessity, this IVC filter should be considered a permanent device. This patient will not be actively followed for future filter retrieval.   Electronically Signed   By: Sandi Mariscal M.D.   On: 08/30/2014 14:14   Ir US Guide Vasc Access Right  08/30/2014   INDICATION: Poor venous access. In need of intravenous access for medication administration and blood draws.  Patient with complex past medical history significant for CAD, cerebrovascular disease, DM, dyslipidemia, hypertension, ulcerative colitis and recent hospitalization from 08/07/2014 through 08/21/2014 for ulcerative colitis flare who presented to [facility] with perianal pain which started the night prior to the admission. CT scan on admission demonstrated a complex perirectal infection extending up toward the retroperitoneal area. She underwent I&D of the rectal abscess on 08/22/2014 but has since had issues with persistent rectal bleeding.  Patient developed a left lower extremity pain and swelling for which a left lower extremity venous Doppler ultrasound was performed on 08/29/2014 and demonstrating a left peroneal and proximal calf DVT.  As such, request now received for IVC filter placement due to  anticoagulation contraindication.  EXAM: 1. ULTRASOUND GUIDANCE FOR VASCULAR ACCESS 2. IVC CATHETERIZATION AND VENOGRAM 3. IVC FILTER INSERTION 4. ULTRASOUND FLUOROSCOPIC GUIDED RIGHT BRACHIAL VEIN PICC LINE PLACEMENT  COMPARISON:  CT the abdomen and pelvis - 08/22/2014  MEDICATIONS: Fentanyl 50 mcg IV; Versed 2 mg IV  ANESTHESIA/SEDATION: Sedation Time  32 minutes  CONTRAST:  None. The inferior vena cavagram was performed with CO2 given history of contrast allergy  FLUOROSCOPY TIME:  1 minute (993 mGy)  COMPLICATIONS: None immediate  PROCEDURE: Informed consent was obtained from the patient following explanation of the procedure, risks, benefits and alternatives for both IVC filter placement as well as PICC line placement. The patient understands, agrees and consents for both procedures. All questions were addressed. A time out was performed prior to the initiation of the procedure.  The right upper extremity was prepped with chlorhexidine  in a sterile fashion, and a sterile drape was applied covering the operative field. Maximum barrier sterile technique with sterile gowns and gloves were used for the procedure. A timeout was performed prior to the initiation of the procedure. Local anesthesia was provided with 1% lidocaine.  Under direct ultrasound guidance, the right brachial vein was accessed with a micropuncture kit after the overlying soft tissues were anesthetized with 1% lidocaine. Incidental note was made of a proximal bifurcation of the brachial artery below the level of the elbow joint. An ultrasound image was saved for documentation purposes. A guidewire was advanced to the level of the superior caval-atrial junction for measurement purposes and the PICC line was cut to length. A peel-away sheath was placed and a 39 cm, 5 [place], dual lumen was inserted to level of the superior caval-atrial junction. A post procedure spot fluoroscopic was obtained. The catheter easily aspirated and flushed and was  sutured in place. A dressing was placed. The patient tolerated the procedure well without immediate post procedural complication.  Attention was now paid towards placement of the IVC filter.  Again, maximal barrier sterile technique utilized including caps, mask, sterile gowns, sterile gloves, large sterile drape, hand hygiene, and Betadine prep.  Under sterile condition and local anesthesia, right internal jugular venous access was performed with ultrasound. An ultrasound image was saved and sent to PACS. Over a guidewire, the IVC filter delivery sheath and inner dilator were advanced into the IVC just above the IVC bifurcation. CO2 injection was performed for an IVC venogram.  Through the delivery sheath, a retrievable Denali IVC filter was deployed below the level of the renal veins and above the IVC bifurcation. Limited post deployment venacavagram was performed.  The delivery sheath was removed and hemostasis was obtained with manual compression. A dressing was placed.  The patient tolerated the above procedures well without immediate post procedural complication.  FINDINGS: After PICC line placement, the tip terminates within the superior cavoatrial junction.  The IVC is patent. No evidence of thrombus, stenosis, or occlusion. No variant venous anatomy. Successful placement of the IVC filter below the level of the renal veins.  IMPRESSION: 1. Successful placement of a right brachial vein approach dual lumen PICC line with tip terminating within the superior cavoatrial junction. The PICC line is ready for immediate use. 2. Successful ultrasound and fluoroscopically guided placement of an infrarenal retrievable IVC filter via right jugular approach.  PLAN: Due to patient related comorbidities and/or clinical necessity, this IVC filter should be considered a permanent device. This patient will not be actively followed for future filter retrieval.   Electronically Signed   By: Sandi Mariscal M.D.   On: 08/30/2014  14:14    Micro Results  Results for orders placed or performed during the hospital encounter of 08/22/14  Culture, blood (routine x 2)     Status: None   Collection Time: 08/22/14  9:30 PM  Result Value Ref Range Status   Specimen Description BLOOD LEFT HAND  Final   Special Requests BOTTLES DRAWN AEROBIC ONLY 5CC  Final   Culture   Final    NO GROWTH 5 DAYS Performed at [facility]    Report Status 08/27/2014 FINAL  Final  Anaerobic culture     Status: None   Collection Time: 08/23/14 12:03 AM  Result Value Ref Range Status   Specimen Description ABSCESS PERIRECTAL  Final   Special Requests PATIENT ON FOLLOWING Fanny Dance  Final   Gram Stain  Final    NO WBC SEEN NO SQUAMOUS EPITHELIAL CELLS SEEN MODERATE GRAM NEGATIVE RODS FEW YEAST RARE GRAM POSITIVE COCCI    Culture   Final    NO ANAEROBES ISOLATED Performed at [facility]    Report Status 08/27/2014 FINAL  Final  Culture, routine-abscess     Status: None   Collection Time: 08/23/14 12:03 AM  Result Value Ref Range Status   Specimen Description ABSCESS PERIRECTAL  Final   Special Requests PATIENT ON FOLLOWING ZOYSN, VANC  Final   Gram Stain   Final    NO WBC SEEN NO SQUAMOUS EPITHELIAL CELLS SEEN MODERATE GRAM NEGATIVE RODS RARE YEAST RARE GRAM POSITIVE COCCI    Culture   Final    MULTIPLE ORGANISMS PRESENT, NONE PREDOMINANT Note: NO STAPHYLOCOCCUS AUREUS ISOLATED NO GROUP A STREP (S.PYOGENES) ISOLATED Performed at [facility]    Report Status 08/26/2014 FINAL  Final  MRSA PCR Screening     Status: None   Collection Time: 08/23/14  1:32 AM  Result Value Ref Range Status   MRSA by PCR NEGATIVE NEGATIVE Final    Comment:        The GeneXpert MRSA Assay (FDA approved for NASAL specimens only), is one component of a comprehensive MRSA colonization surveillance program. It is not intended to diagnose MRSA infection nor to guide or monitor treatment for MRSA infections.    Culture, blood (routine x 2)     Status: None   Collection Time: 08/23/14  4:55 AM  Result Value Ref Range Status   Specimen Description BLOOD LEFT HAND  Final   Special Requests BOTTLES DRAWN AEROBIC ONLY 5ML  Final   Culture   Final    NO GROWTH 5 DAYS Performed at [facility]    Report Status 08/28/2014 FINAL  Final  Clostridium Difficile by PCR (not at [facility])     Status: None   Collection Time: 08/28/14  7:11 PM  Result Value Ref Range Status   Toxigenic C Difficile by pcr NEGATIVE NEGATIVE Final     No results found for this or any previous visit (from the past 240 hour(s)).     Today   Subjective:   Lavaeh Bau today has no headache,no chest abdominal pain,no new weakness tingling or numbness,  Objective:   Blood pressure 115/64, pulse 79, temperature 98.5 F (36.9 C), temperature source Oral, resp. rate 18, height 5' 9"  (1.753 m), weight 98.8 kg (217 lb 13 oz), SpO2 100 %.   Intake/Output Summary (Last 24 hours) at 09/21/14 1210 Last data filed at 09/21/14 0959  Gross per 24 hour  Intake    410 ml  Output   1875 ml  Net  -1465 ml    Exam   General: elderly female in no acute distress. Appears fatigued.  HEENT: moist mucosa, supple neck  chest: clear b/l  CVS: NS1&S2, no murmurs  GI: soft, ileostomy bag with some stool, BS+, nontender. 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, rectal fistula   Musculoskeletal: warm, no edema Data Review   CBC w Diff:  Lab Results  Component Value Date   WBC 13.2* 09/18/2014   HGB 8.7* 09/18/2014   HCT 27.0* 09/18/2014   PLT 236 09/18/2014   LYMPHOPCT 19 09/18/2014   MONOPCT 7 09/18/2014   EOSPCT 3 09/18/2014   BASOPCT 0 09/18/2014    CMP:  Lab Results  Component Value Date   NA 132* 09/21/2014   K 4.2 09/21/2014   CL 100* 09/21/2014  CO2 26 09/21/2014   BUN 16 09/21/2014   CREATININE 0.45 09/21/2014   CREATININE 1.08 03/28/2014   PROT 5.6* 09/21/2014   ALBUMIN 2.3* 09/21/2014   BILITOT 0.3 09/21/2014    ALKPHOS 101 09/21/2014   AST 31 09/21/2014   ALT 47 09/21/2014  .   Total Time in preparing paper work, data evaluation and todays exam - 35 minutes  ELGERGAWY, DAWOOD M.D on 09/21/2014 at 12:10 PM  [facility]   Office  [phone]

## 2014-09-21 NOTE — Clinical Social Work Placement (Signed)
   CLINICAL SOCIAL WORK PLACEMENT  NOTE  Date:  09/21/2014  Patient Details  Name: [name] MRN: [id number] Date of Birth: 1940-05-24  Clinical Social Work is seeking post-discharge placement for this patient at the [place] level of care (*CSW will initial, date and re-position this form in  chart as items are completed):  Yes   Patient/family provided with [place] Work Department's list of facilities offering this level of care within the geographic area requested by the patient (or if unable, by the patient's family).  Yes   Patient/family informed of their freedom to choose among providers that offer the needed level of care, that participate in Medicare, Medicaid or managed care program needed by the patient, have an available bed and are willing to accept the patient.  Yes   Patient/family informed of [place]'s ownership interest in [facility] and [facility], as well as of the fact that they are under no obligation to receive care at these facilities.  PASRR submitted to EDS on       PASRR number received on       Existing PASRR number confirmed on 09/15/14     FL2 transmitted to all facilities in geographic area requested by pt/family on 09/15/14     FL2 transmitted to all facilities within larger geographic area on       Patient informed that his/her managed care company has contracts with or will negotiate with certain facilities, including the following:        Yes   Patient/family informed of bed offers received.  Patient chooses bed at [facility]     Physician recommends and patient chooses bed at      Patient to be transferred to [place] on 09/21/14.  Patient to be transferred to facility by [facility]     Patient family notified on 09/21/14 of transfer.  Name of family member notified:  Dtr-Ramona     PHYSICIAN       Additional Comment:    _______________________________________________ Boone Master, [place] 09/21/2014, 1:54 PM

## 2014-09-21 NOTE — Progress Notes (Signed)
Report called to Maudie Mercury at [facility].

## 2014-09-21 NOTE — Progress Notes (Signed)
Clinical Social Work  CSW faxed DC summary to [facility] who is agreeable to accept. [facility] auth # [id number]. CSW informed patient and dtr (Ramona) of DC plans. [facility] to provide transportation to SNF. RN to call report. PT[facility] number: [id number].  Patient reports she is upset about DC today and is scared about change. CSW validated patient's feelings and reminded her that family would visit her often.  CSW is signing off but available if needed.  [place], [place] [phone]

## 2014-09-21 NOTE — Consult Note (Addendum)
[facility]  Total days of antibiotics 30        Day 2 amox/clav        Day 25 fluconazole        (previously had 20 d piptaz & 16 d metronidazole & 6 d vanco)      Reason for Consult: perirectal abscess/fistula   Referring Physician: [email or web address]  Principal Problem:   Abscess, perirectal s/p I&D 08/23/2014 Active Problems:   Essential hypertension   Inflammatory bowel disease (IBD) with colitis   Sepsis   Infection due to yeast   Acute blood loss anemia   Leukocytosis   Hypokalemia   Hyponatremia   General weakness   Thrombocytopenia   Ulcerative colitis   DVT (deep venous thrombosis)   DVT (deep vein thrombosis) in pregnancy   Cold   Fasciitis    HPI: [name] is a 74 y.o. female  with past medical history of ulcerative colitis on lialda and remicaide, CAD status post CABG, e status post CEA who initially presented on June 20th with severe diarrhea. She had recently started on clindamycin for toe infection 2 weeks ago subsequently developed severe diarrhea and clindamycin was stopped. Unfortunately still continues having loose stools almost every hour, intermittently has noticed blood in her stools. Due to progressive weakness and worsening diarrhea presented to the ER today. She reports some mild lower abdominal pain, no fevers and a white count of 13.5.she was thought to have a Flare of ulcerative colitis. sxs began post initiation of Cleocin but C diff PCR negative 6/16, 6/23.since Flex sig 6/22: diffuse colitis, looking like UC; pseudomembranes present. Path: severe, active colitis, no Cdiff. She was placed on Solumedrol. TID Lomotil added to Lialda.She was discharged on 7/4 but readmitted the following day for worsening severe rectal pain.CT scan demonstrated a complex perirectal infection extending up toward the retroperitoneal area. She has not had any fevers or chills. She is on prednisone and mesalamine for her ulcerative colitis.she went to  the OR on 7/6 for  incision and drainage of complex perirectal abscess. She finished 14 day course of Zosyn per ID, but then developed  for fistula. she underwent diverting ileostomy 7/27. Her initial cx from 7/6 showed polymicrobial with presence of yeast. In the last 48hrs, she was switched from piptazo to amox/clav plus fluconazole. During this hospitalization, she has had first 2 doses of induction Remicade.   Primary team has asked ID to determine length of course of treatment  i reviewed the records from this admission and last admission as well as reviewed micro results.  Past Medical History  Diagnosis Date  . CAD (coronary artery disease)     unspecified  . Cerebrovascular disease   . Hyperlipidemia, mixed   . HTN (hypertension)   . Ulcerative colitis   . Diverticulitis   . Colon polyps 2006  . Thyroid disease   . Myocardial infarct   . Carotid artery occlusion 2010  . Diabetes mellitus without complication     Borderline  . GERD (gastroesophageal reflux disease)   . Arthritis     Allergies:  Allergies  Allergen Reactions  . Clindamycin/Lincomycin Diarrhea    Leads to colitis flare  . Ivp Dye [Iodinated Diagnostic Agents] Other (See Comments)    "almost passed out"    MEDICATIONS: . sodium chloride   Intravenous Once  . amLODipine  10 mg Oral Daily  . amoxicillin-clavulanate  1 tablet Oral Q12H  . fluconazole (DIFLUCAN) IV  400 mg Intravenous Q24H  . lip balm  1 application Topical BID  . metoprolol tartrate  25 mg Oral BID  . nystatin   Topical BID  . predniSONE  10 mg Oral Q breakfast   Followed by  . [START ON 09/24/2014] predniSONE  8 mg Oral Q breakfast   Followed by  . [START ON 09/27/2014] predniSONE  6 mg Oral Q breakfast   Followed by  . [START ON 09/30/2014] predniSONE  4 mg Oral Q breakfast   Followed by  . [START ON 10/03/2014] predniSONE  2 mg Oral Q breakfast   Followed by  . [START ON 10/06/2014] predniSONE  1 mg Oral Q breakfast   Followed by   . [START ON 10/09/2014] predniSONE  0.5 mg Oral Q breakfast  . saccharomyces boulardii  250 mg Oral BID  . sodium chloride  10-40 mL Intracatheter Q12H  . sodium chloride  3 mL Intravenous Q12H  . sodium chloride  3 mL Intravenous Q12H  . sodium chloride  2 g Oral BID  . zolpidem  5 mg Oral QHS    History  Substance Use Topics  . Smoking status: Former Smoker -- 0.30 packs/day for 40 years    Types: Cigarettes    Quit date: 02/17/2013  . Smokeless tobacco: Never Used  . Alcohol Use: No    Family History  Problem Relation Age of Onset  . Colitis Brother     undefined  . Stomach cancer Mother   . Stroke Father     died  . Colon cancer Neg Hx      Review of Systems  Constitutional: positive for fatigue and weakness associated with hospitalization. Negative for fever, chills, diaphoresis, activity change, appetite change, and unexpected weight change.  HENT: Negative for congestion, sore throat, rhinorrhea, sneezing, trouble swallowing and sinus pressure.  Eyes: Negative for photophobia and visual disturbance.  Respiratory: Negative for cough, chest tightness, shortness of breath, wheezing and stridor.  Cardiovascular: Negative for chest pain, palpitations and leg swelling.  Gastrointestinal: mild abdominal pain Negative for nausea, vomiting, abdominal pain, diarrhea, constipation, blood in stool, abdominal distention and anal bleeding.  Genitourinary: Negative for dysuria, hematuria, flank pain and difficulty urinating.  Musculoskeletal: Negative for myalgias, back pain, joint swelling, arthralgias and gait problem.  Skin: positive wound from rectal abscess. Negative for color change, pallor, rash and wound.  Neurological: Negative for dizziness, tremors, weakness and light-headedness.  Hematological: Negative for adenopathy. Does not bruise/bleed easily.  Psychiatric/Behavioral: Negative for behavioral problems, confusion, sleep disturbance, dysphoric mood, decreased  concentration and agitation.     OBJECTIVE: Temp:  [98.1 F (36.7 C)-98.7 F (37.1 C)] 98.5 F (36.9 C) (08/04 0500) Pulse Rate:  [71-79] 79 (08/04 0500) Resp:  [16-18] 18 (08/04 0500) BP: (115-136)/(62-66) 115/64 mmHg (08/04 0500) SpO2:  [96 %-100 %] 100 % (08/04 0500) Weight:  [217 lb 13 oz (98.8 kg)] 217 lb 13 oz (98.8 kg) (08/04 0500) Physical Exam  Constitutional:  oriented to person, place, and time. appears well-developed and well-nourished. No distress.  HENT: [place]/AT, PERRLA, no scleral icterus. Ears glasses Mouth/Throat: Oropharynx is clear and moist. No oropharyngeal exudate. Poor dentition Cardiovascular: Normal rate, regular rhythm and normal heart sounds. Exam reveals no gallop and no friction rub.  No murmur heard.  Pulmonary/Chest: Effort normal and breath sounds normal. No respiratory distress.  has no wheezes.  Neck = supple, no nuchal rigidity Abdominal: Soft. Bowel sounds are normal.  Ileostomy with fecal material in the ostomy bag  in right lower quadrant. Mild tenderness with deep palpation Lymphadenopathy: no cervical adenopathy. No axillary adenopathy Neurological: alert and oriented to person, place, and time.  Skin: Skin is warm and dry. No rash noted. No erythema.  Psychiatric: a normal mood and affect.  behavior is normal.    LABS: Results for orders placed or performed during the hospital encounter of 08/22/14 (from the past 48 hour(s))  Glucose, capillary     Status: Abnormal   Collection Time: 09/19/14 11:44 AM  Result Value Ref Range   Glucose-Capillary 183 (H) 65 - 99 mg/dL   Comment 1 Notify RN   Glucose, capillary     Status: Abnormal   Collection Time: 09/19/14  5:04 PM  Result Value Ref Range   Glucose-Capillary 132 (H) 65 - 99 mg/dL   Comment 1 Notify RN   Glucose, capillary     Status: Abnormal   Collection Time: 09/19/14  8:38 PM  Result Value Ref Range   Glucose-Capillary 134 (H) 65 - 99 mg/dL   Comment 1 Notify RN   Glucose,  capillary     Status: Abnormal   Collection Time: 09/20/14 12:16 AM  Result Value Ref Range   Glucose-Capillary 112 (H) 65 - 99 mg/dL   Comment 1 Notify RN   Glucose, capillary     Status: Abnormal   Collection Time: 09/20/14  3:51 AM  Result Value Ref Range   Glucose-Capillary 113 (H) 65 - 99 mg/dL   Comment 1 Notify RN   Glucose, capillary     Status: Abnormal   Collection Time: 09/20/14  7:37 AM  Result Value Ref Range   Glucose-Capillary 124 (H) 65 - 99 mg/dL  Basic metabolic panel     Status: Abnormal   Collection Time: 09/20/14  8:35 AM  Result Value Ref Range   Sodium 130 (L) 135 - 145 mmol/L   Potassium 4.1 3.5 - 5.1 mmol/L   Chloride 98 (L) 101 - 111 mmol/L   CO2 25 22 - 32 mmol/L   Glucose, Bld 150 (H) 65 - 99 mg/dL   BUN 16 6 - 20 mg/dL   Creatinine, Ser 0.46 0.44 - 1.00 mg/dL   Calcium 7.9 (L) 8.9 - 10.3 mg/dL   GFR calc non Af Amer >60 >60 mL/min   GFR calc Af Amer >60 >60 mL/min    Comment: (NOTE) The eGFR has been calculated using the CKD EPI equation. This calculation has not been validated in all clinical situations. eGFR's persistently <60 mL/min signify possible Chronic Kidney Disease.    Anion gap 7 5 - 15  Magnesium     Status: Abnormal   Collection Time: 09/20/14  8:35 AM  Result Value Ref Range   Magnesium 1.6 (L) 1.7 - 2.4 mg/dL  Glucose, capillary     Status: Abnormal   Collection Time: 09/20/14 12:04 PM  Result Value Ref Range   Glucose-Capillary 189 (H) 65 - 99 mg/dL  Glucose, capillary     Status: Abnormal   Collection Time: 09/20/14  4:34 PM  Result Value Ref Range   Glucose-Capillary 161 (H) 65 - 99 mg/dL  Comprehensive metabolic panel     Status: Abnormal   Collection Time: 09/21/14  5:30 AM  Result Value Ref Range   Sodium 132 (L) 135 - 145 mmol/L   Potassium 4.2 3.5 - 5.1 mmol/L   Chloride 100 (L) 101 - 111 mmol/L   CO2 26 22 - 32 mmol/L   Glucose, Bld 101 (H) 65 -  99 mg/dL   BUN 16 6 - 20 mg/dL   Creatinine, Ser 0.45 0.44 - 1.00  mg/dL   Calcium 8.3 (L) 8.9 - 10.3 mg/dL   Total Protein 5.6 (L) 6.5 - 8.1 g/dL   Albumin 2.3 (L) 3.5 - 5.0 g/dL   AST 31 15 - 41 U/L   ALT 47 14 - 54 U/L   Alkaline Phosphatase 101 38 - 126 U/L   Total Bilirubin 0.3 0.3 - 1.2 mg/dL   GFR calc non Af Amer >60 >60 mL/min   GFR calc Af Amer >60 >60 mL/min    Comment: (NOTE) The eGFR has been calculated using the CKD EPI equation. This calculation has not been validated in all clinical situations. eGFR's persistently <60 mL/min signify possible Chronic Kidney Disease.    Anion gap 6 5 - 15  Magnesium     Status: None   Collection Time: 09/21/14  5:30 AM  Result Value Ref Range   Magnesium 1.7 1.7 - 2.4 mg/dL    MICRO: 7/6 polymicrobial cx  IMAGING: i reviewed the CT scan from 7/5 abd CT from 7/5 Extensive soft tissue gas within the right gluteal fat extending into the right gluteal musculature as well as extending cranially throughout the retroperitoneum. Findings are concerning for an aggressive infectious process in the setting of retroperitoneal fasciitis. Potential causative etiology may be distal colitis with perforation and associated abscess formation. Recommend surgical consultation and broad-spectrum antibiotic therapy.  Wall thickening of the distal transverse and descending colon likely secondary to colitis.  Assessment/Plan:  74yo F with UC who was admitted initially at the end of June for diarrhea found to have distal colitis/flare but subseqeuntly developed microperforation, with associated peri-rectal abscess formation. She underwent initially IxD, then diverting ileostomy.  - continue with amox/clav plus fluconazole - recommend to repeat abdominal/pelvic CT in 2 weeks to assess to if rectocutaneous fistula track/rectal abscess is resolved so that we can determine end date for antibiotics - for now would plan for addn 14-21 days of oral antibiotics.  Severe colitis/UC: patient has been receiving remicaide,  and steroids. Pt has a follow up appt with Dr Deatra Ina on 8/22 need follow up Ct scan at that time as well   Spent 60 min with greater than 50% on review of records, coordination of care with primary team.  Elzie Rings. [facility] [phone]

## 2014-09-21 NOTE — Progress Notes (Signed)
Physical Therapy Treatment Patient Details Name: [name] MRN: [id number] DOB: Jan 19, 1941 Today's Date: 09/21/2014    History of Present Illness 74 yo female adm with complex perirectal infection extending up toward the retroperitoneal area; pt underwent I& D on 08/23/14, bil LE DVTs-s/p IVC vilter. S/P diverting ileostomy 09/13/14. PMHx:  HTN, CVA, ulcerative colitis    PT Comments    Used Hoyer Lift to assist pt OOB to recliner.  Increased time to perform bed mobility and increased time to position pt in recliner to comfort.    Follow Up Recommendations  SNF     Equipment Recommendations       Recommendations for Other Services       Precautions / Restrictions Precautions Precautions: Fall Restrictions Weight Bearing Restrictions: No    Mobility  Bed Mobility Overal bed mobility: Needs Assistance Bed Mobility: Rolling     Supine to sit: Max assist     General bed mobility comments: side to side rolling to apply Hoyer pad increased assist with B LE's.    Transfers Overall transfer level: Needs assistance               General transfer comment: used Hoyer Lift to assist pt OOB.  Ambulation/Gait                 Stairs            Wheelchair Mobility    Modified Rankin (Stroke Patients Only)       Balance                                    Cognition Arousal/Alertness: Awake/alert Behavior During Therapy: WFL for tasks assessed/performed Overall Cognitive Status: Within Functional Limits for tasks assessed                      Exercises      General Comments        Pertinent Vitals/Pain Pain Assessment: No/denies pain    Home Living                      Prior Function            PT Goals (current goals can now be found in the care plan section) Progress towards PT goals: Progressing toward goals    Frequency  Min 3X/week    PT Plan      Co-evaluation             End of  Session     Patient left: in chair;with call bell/phone within reach     Time: 0820-0845 PT Time Calculation (min) (ACUTE ONLY): 25 min  Charges:  $Therapeutic Activity: 23-37 mins                    G Codes:      Rica Koyanagi  PTA [facility]  Acute  Rehab Pager      [phone]

## 2014-09-21 NOTE — Consult Note (Addendum)
WOC ostomy follow up Stoma type/location: RLQ Ileostomy  Stomal assessment/size: 1 and 1/4 inch round with traction is applied at 12 o'clock position.  I cut skin barrier slightly oval today as that is how stoma is at rest. Peristomal assessment: clear, intact Treatment options for stomal/peristomal skin: skin barrier ring Output soft brown stool, Ostomy pouching: 2pc.2 and 1/4 inches with skin barrier ring  Education provided: No family in room at time of visit. Patient observes removal of previous pouching system and looks away when I clean the stoma.  She is interested in the process of the pouch preparation and application but does not appear to be taking it in enough to perform self-care.  I demonstrate pouch securement with the Lock and Roll Closure but she cannot repeat the technique even with cueing today. Two pouching set-ups and an ostomy teaching booklet are at the bedside for future use. Enrolled patient in [place] Start Discharge program: Yes [facility] nursing team will follow, and will remain available to this patient, the nursing, surgical and medical teams.   Thanks, Maudie Flakes, MSN, RN, [place], [place], [place] [phone])

## 2014-09-27 ENCOUNTER — Non-Acute Institutional Stay (SKILLED_NURSING_FACILITY): Payer: Commercial Managed Care - HMO | Admitting: Nurse Practitioner

## 2014-09-27 DIAGNOSIS — K6389 Other specified diseases of intestine: Secondary | ICD-10-CM | POA: Diagnosis not present

## 2014-09-27 DIAGNOSIS — I10 Essential (primary) hypertension: Secondary | ICD-10-CM | POA: Diagnosis not present

## 2014-09-27 DIAGNOSIS — D62 Acute posthemorrhagic anemia: Secondary | ICD-10-CM | POA: Diagnosis not present

## 2014-09-27 DIAGNOSIS — I82403 Acute embolism and thrombosis of unspecified deep veins of lower extremity, bilateral: Secondary | ICD-10-CM

## 2014-09-27 DIAGNOSIS — R531 Weakness: Secondary | ICD-10-CM | POA: Diagnosis not present

## 2014-09-27 DIAGNOSIS — B379 Candidiasis, unspecified: Secondary | ICD-10-CM

## 2014-09-27 DIAGNOSIS — K529 Noninfective gastroenteritis and colitis, unspecified: Secondary | ICD-10-CM

## 2014-09-27 DIAGNOSIS — F411 Generalized anxiety disorder: Secondary | ICD-10-CM

## 2014-09-27 DIAGNOSIS — K611 Rectal abscess: Secondary | ICD-10-CM

## 2014-09-27 MED ORDER — SERTRALINE HCL 50 MG PO TABS: 50.0000 mg | ORAL_TABLET | Freq: Every day | ORAL | Status: DC

## 2014-09-27 NOTE — Progress Notes (Signed)
Patient ID: [name], female   DOB: [birth year], 74 y.o.   MRN: [id number]    Nursing Home Location:  [place] of Service: SNF (31)  PCP: Kandice Hams, MD  Allergies  Allergen Reactions  . Clindamycin/Lincomycin Diarrhea    Leads to colitis flare  . Ivp Dye [Iodinated Diagnostic Agents] Other (See Comments)    "almost passed out"    Chief Complaint  Patient presents with  . Hospitalization Follow-up    HPI:  Patient is a 74 y.o. female seen today at [facility] to follow up hospitalization. Pt with a pmh of dyslipidemia, hypertension, long standing ulcerative colitis. Pt with recent hospitalized from 08/07/2014 through 08/21/2014 for ulcerative colitis flare. Pt readmitted from 08/22/14-09/21/14 due to complex perirectal infection extending up toward the retroperitoneal area s/p incision and drainage of the large issue rectal abscess on the right side. She was started on vancomycin and Zosyn. Hospital course complicated by GI bleed and DVT noted to left LE.  Pt started on prednisone and mesalamine and remicade for GI bleed. tranfused 3 units PRBC.  IVC filter placed due to DVT and not candidate for anticoagulation due to bleeding and uncontrolled ulcerative colitis. Pt at [facility] for ongoing wound care, antibiotics and therapy.  Reports increase in anxiety due to illness and complications that have occurred.  Daughter at bedside.   Review of Systems:  Review of Systems  Constitutional: Positive for activity change and appetite change. Negative for fever, chills, fatigue and unexpected weight change.  HENT: Negative for congestion and hearing loss.   Eyes: Negative.   Respiratory: Negative for cough and shortness of breath.   Cardiovascular: Negative for chest pain, palpitations and leg swelling.  Gastrointestinal: Negative for abdominal pain, diarrhea and constipation.  Genitourinary:       Foley cath due to open wound    Musculoskeletal: Negative for myalgias and arthralgias.  Skin: Positive for wound. Negative for color change.  Neurological: Positive for weakness. Negative for dizziness.  Psychiatric/Behavioral: Negative for behavioral problems, confusion and agitation. The patient is nervous/anxious.     Past Medical History  Diagnosis Date  . CAD (coronary artery disease)     unspecified  . Cerebrovascular disease   . Hyperlipidemia, mixed   . HTN (hypertension)   . Ulcerative colitis   . Diverticulitis   . Colon polyps 2006  . Thyroid disease   . Myocardial infarct   . Carotid artery occlusion 2010  . Diabetes mellitus without complication     Borderline  . GERD (gastroesophageal reflux disease)   . Arthritis    Past Surgical History  Procedure Laterality Date  . Cholecystectomy  7.06.2008    with cholangiogram  . 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 Abdominal hysterectomy    . Colonoscopy  multiple  . Sigmoidoscopy  multiple  . Flexible sigmoidoscopy N/A 08/08/2014    Procedure: FLEXIBLE SIGMOIDOSCOPY;  Surgeon: Gatha Mayer, MD;  Location: [place];  Service: Endoscopy;  Laterality: N/A;  . Incision and drainage perirectal abscess N/A 08/22/2014    Procedure: IRRIGATION AND DEBRIDEMENT PERIRECTAL ABSCESS;  Surgeon: Jackolyn Confer, MD;  [facility];  Service: General;  Laterality: N/A;  . Joint replacement  left knee  . Flexible sigmoidoscopy N/A 09/06/2014    Procedure: FLEXIBLE SIGMOIDOSCOPY;  Surgeon: Lafayette Dragon, MD;  [facility];  Service: Endoscopy;  Laterality: N/A;  . Laparoscopic diverted colostomy N/A 09/13/2014    Procedure: LAPAROSCOPIC  DIVERTED ILEOSTOMY;  Surgeon: Stark Klein, MD;  Location[facility];  Service: General;  Laterality: N/A;  . Rectal exam under anesthesia N/A 09/13/2014    Procedure: RECTAL EXAM UNDER ANESTHESIA  AND DEBRIDEMENT OF PERIANAL WOUND;  Surgeon: Stark Klein, MD;  Location[facility];  Service: General;  Laterality: N/A;    Social History:   reports that she quit smoking about 19 months ago. Her smoking use included Cigarettes. She has a 12 pack-year smoking history. She has never used smokeless tobacco. She reports that she does not drink alcohol or use illicit drugs.  Family History  Problem Relation Age of Onset  . Colitis Brother     undefined  . Stomach cancer Mother   . Stroke Father     died  . Colon cancer Neg Hx     Medications: Patient's Medications  New Prescriptions   No medications on file  Previous Medications   ACETAMINOPHEN (TYLENOL) 500 MG TABLET    Take 2 tablets (1,000 mg total) by mouth every 4 (four) hours as needed for mild pain or moderate pain.   ALPRAZOLAM (XANAX) 0.5 MG TABLET    Take 1 tablet (0.5 mg total) by mouth 2 (two) times daily as needed for anxiety.   AMLODIPINE (NORVASC) 10 MG TABLET    Take 1 tablet (10 mg total) by mouth daily.   AMOXICILLIN-CLAVULANATE (AUGMENTIN) 875-125 MG PER TABLET    Take 1 tablet by mouth 2 (two) times daily. Patient will need to continue with oral Augmentin until she is seen in GI in 2 weeks, so continue for 3 weeks.   CRESTOR 40 MG TABLET    TAKE 1 TABLET AT BEDTIME   FEEDING SUPPLEMENT (BOOST / RESOURCE BREEZE) LIQD    Take 1 Container by mouth 3 (three) times daily between meals.   FLUCONAZOLE (DIFLUCAN) 200 MG TABLET    Take 2 tablets (400 mg total) by mouth daily. Patient will need to continue with oral Augmentin until she is seen in GI in 2 weeks, so continue for 3 weeks.   MESALAMINE (LIALDA) 1.2 G EC TABLET    take 4 tablets by mouth once daily   METOPROLOL TARTRATE (LOPRESSOR) 25 MG TABLET    take 1 tablet by mouth twice a day   OXYCODONE (OXY IR/ROXICODONE) 5 MG IMMEDIATE RELEASE TABLET    Take 1 tablet (5 mg total) by mouth every 4 (four) hours as needed for moderate pain, severe pain or breakthrough pain.   PREDNISONE (DELTASONE) 1 MG TABLET    Take 4 tablets (4 mg total) by mouth daily with breakfast. For 3 doses   PREDNISONE  (DELTASONE) 1 MG TABLET    Take 2 tablets (2 mg total) by mouth daily with breakfast. For 3 doses   PREDNISONE (DELTASONE) 1 MG TABLET    Take 8 tablets (8 mg total) by mouth daily with breakfast. For 3 doses   PREDNISONE (DELTASONE) 1 MG TABLET    Take 6 tablets (6 mg total) by mouth daily with breakfast. For 3 doses   PREDNISONE (DELTASONE) 1 MG TABLET    Take 1 tablet (1 mg total) by mouth daily with breakfast. For 3 doses   PREDNISONE (DELTASONE) 1 MG TABLET    Take 0.5 tablets (0.5 mg total) by mouth daily with breakfast. For 3 doses   PREDNISONE (DELTASONE) 10 MG TABLET    Take 1 tablet (10 mg total) by mouth daily with breakfast. For 3 doses   PSYLLIUM (HYDROCIL/METAMUCIL) 95 %  PACK    Take 1 packet by mouth 2 (two) times daily.   SACCHAROMYCES BOULARDII (FLORASTOR) 250 MG CAPSULE    Take 1 capsule (250 mg total) by mouth 2 (two) times daily.  Modified Medications   No medications on file  Discontinued Medications   No medications on file     Physical Exam: Filed Vitals:   09/27/14 1057  BP: 128/68  Pulse: 78  Temp: 98.1 F (36.7 C)  Resp: 20    Physical Exam  Constitutional: She is oriented to person, place, and time. No distress.  Frail female NAD  HENT:  Head: Normocephalic and atraumatic.  Mouth/Throat: Oropharynx is clear and moist. No oropharyngeal exudate.  Eyes: Conjunctivae are normal. Pupils are equal, round, and reactive to light.  Neck: Normal range of motion. Neck supple.  Cardiovascular: Normal rate, regular rhythm and normal heart sounds.   Pulmonary/Chest: Effort normal and breath sounds normal.  Abdominal: Soft. Bowel sounds are normal.  Right lower quadrant colostomy with output   Musculoskeletal: She exhibits no edema or tenderness.  Neurological: She is alert and oriented to person, place, and time.  Skin: Skin is warm and dry. She is not diaphoretic.  Perirectal abscess with minimal purulent drainage   Psychiatric: She has a normal mood and affect.     Labs reviewed: Basic Metabolic Panel:  Recent Labs  09/11/14 0333  09/14/14 0720  09/18/14 0630 09/19/14 0453 09/20/14 0835 09/21/14 0530  NA 128*  < > 130*  < > 130*  --  130* 132*  K 4.5  < > 4.8  < > 4.0  --  4.1 4.2  CL 96*  < > 97*  < > 96*  --  98* 100*  CO2 29  < > 30  < > 28  --  25 26  GLUCOSE 182*  < > 98  < > 110*  --  150* 101*  BUN 30*  < > 23*  < > 20  --  16 16  CREATININE 0.43*  < > 0.55  < > 0.52  --  0.46 0.45  CALCIUM 7.6*  < > 7.8*  < > 8.1*  --  7.9* 8.3*  MG 1.7  --  1.7  --  1.5* 1.6* 1.6* 1.7  PHOS 3.4  --  4.0  --  3.2  --   --   --   < > = values in this interval not displayed. Liver Function Tests:  Recent Labs  09/14/14 0720 09/18/14 0630 09/21/14 0530  AST 35 24 31  ALT 23 27 47  ALKPHOS 86 78 101  BILITOT 0.7 0.3 0.3  PROT 4.8* 5.0* 5.6*  ALBUMIN 1.9* 2.0* 2.3*    Recent Labs  08/07/14 1201  LIPASE 13*   No results for input(s): AMMONIA in the last 8760 hours. CBC:  Recent Labs  09/07/14 0438  09/11/14 0333  09/15/14 0427 09/16/14 0555 09/18/14 0630  WBC 11.8*  < > 25.6*  < > 14.6* 17.0* 13.2*  NEUTROABS 10.6*  --  22.0*  --   --   --  9.4*  HGB 8.2*  < > 7.4*  < > 8.8* 9.5* 8.7*  HCT 26.1*  < > 22.3*  < > 26.8* 28.9* 27.0*  MCV 94.9  < > 91.8  < > 95.7 94.8 95.1  PLT 362  < > 293  < > 218 247 236  < > = values in this interval not displayed. TSH: No results for  input(s): TSH in the last 8760 hours. A1C: Lab Results  Component Value Date   HGBA1C 6.5 03/04/2013   Lipid Panel:  Recent Labs  03/28/14 1022 09/07/14 0438 09/11/14 0333 09/18/14 0630  CHOL 146  --   --   --   HDL 71  --   --   --   LDLCALC 66  --   --   --   TRIG 45 128 109 130  CHOLHDL 2.1  --   --   --      Assessment/Plan 1. 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 -pain has improved  on current regimen, conts on oxy IR   2. Abscess, perirectal s/p I&D 08/23/2014 -ongoing antibiotic treatment, was seen by ID Dr. Graylon Good , will need another 2-3 weeks of oral Augmentin and fluconazole, plan is to repeat CT abdomen pelvis to ensure resolution of fistula and abscess. Her to make decisions about antibiotics length of treatment.  -follow up with ID in place -ongoing wound care by treatment nurse  3. Acute blood loss anemia -will follow up CBC  4. Infection due to yeast Followed by ID in hospital, conts on fluconazole at this time.   5. DVT (deep venous thrombosis), bilateral Not a candidate for anticoagulation due to ongoing rectal bleed and uncontrolled ulcerative colitis. IVC filter placed.  6. Essential hypertension Stable, conts on metoprolol   7. General weakness -due to long hospitalization with debility. At [facility] for strength and gait training per therapy.   8. Anxiety  Pt reports extreme anxiety to myself and staff over recent events -will start zoloft 25 mg PO daily for 7 days then to increase to 50 mg daily -conts xanax PRN    Ylonda Storr K. Harle Battiest  [facility] [phone] 8 am - 5 pm) [phone] (after hours)

## 2014-09-29 ENCOUNTER — Inpatient Hospital Stay (HOSPITAL_COMMUNITY): Payer: Commercial Managed Care - HMO

## 2014-09-29 ENCOUNTER — Inpatient Hospital Stay (HOSPITAL_COMMUNITY)
Admission: EM | Admit: 2014-09-29 | Discharge: 2014-10-07 | DRG: 386 | Disposition: A | Discharge status: Skilled Nursing Facility | Payer: Commercial Managed Care - HMO | Attending: Family Medicine | Admitting: Family Medicine

## 2014-09-29 ENCOUNTER — Encounter (HOSPITAL_COMMUNITY): Payer: Self-pay

## 2014-09-29 DIAGNOSIS — Z87891 Personal history of nicotine dependence: Secondary | ICD-10-CM | POA: Diagnosis not present

## 2014-09-29 DIAGNOSIS — Z96652 Presence of left artificial knee joint: Secondary | ICD-10-CM | POA: Diagnosis present

## 2014-09-29 DIAGNOSIS — E440 Moderate protein-calorie malnutrition: Secondary | ICD-10-CM | POA: Diagnosis present

## 2014-09-29 DIAGNOSIS — Z1611 Resistance to penicillins: Secondary | ICD-10-CM | POA: Diagnosis present

## 2014-09-29 DIAGNOSIS — B961 Klebsiella pneumoniae [K. pneumoniae] as the cause of diseases classified elsewhere: Secondary | ICD-10-CM | POA: Diagnosis present

## 2014-09-29 DIAGNOSIS — Z8673 Personal history of transient ischemic attack (TIA), and cerebral infarction without residual deficits: Secondary | ICD-10-CM | POA: Diagnosis not present

## 2014-09-29 DIAGNOSIS — R10813 Right lower quadrant abdominal tenderness: Secondary | ICD-10-CM | POA: Diagnosis not present

## 2014-09-29 DIAGNOSIS — Z951 Presence of aortocoronary bypass graft: Secondary | ICD-10-CM

## 2014-09-29 DIAGNOSIS — K604 Rectal fistula: Secondary | ICD-10-CM | POA: Diagnosis not present

## 2014-09-29 DIAGNOSIS — K51511 Left sided colitis with rectal bleeding: Principal | ICD-10-CM | POA: Diagnosis present

## 2014-09-29 DIAGNOSIS — Z79891 Long term (current) use of opiate analgesic: Secondary | ICD-10-CM

## 2014-09-29 DIAGNOSIS — Z90710 Acquired absence of both cervix and uterus: Secondary | ICD-10-CM

## 2014-09-29 DIAGNOSIS — D849 Immunodeficiency, unspecified: Secondary | ICD-10-CM

## 2014-09-29 DIAGNOSIS — K51911 Ulcerative colitis, unspecified with rectal bleeding: Secondary | ICD-10-CM | POA: Diagnosis not present

## 2014-09-29 DIAGNOSIS — L89312 Pressure ulcer of right buttock, stage 2: Secondary | ICD-10-CM | POA: Diagnosis present

## 2014-09-29 DIAGNOSIS — E861 Hypovolemia: Secondary | ICD-10-CM | POA: Diagnosis present

## 2014-09-29 DIAGNOSIS — N390 Urinary tract infection, site not specified: Secondary | ICD-10-CM | POA: Diagnosis present

## 2014-09-29 DIAGNOSIS — K219 Gastro-esophageal reflux disease without esophagitis: Secondary | ICD-10-CM | POA: Diagnosis present

## 2014-09-29 DIAGNOSIS — R7309 Other abnormal glucose: Secondary | ICD-10-CM | POA: Diagnosis present

## 2014-09-29 DIAGNOSIS — Z79899 Other long term (current) drug therapy: Secondary | ICD-10-CM | POA: Diagnosis not present

## 2014-09-29 DIAGNOSIS — R531 Weakness: Secondary | ICD-10-CM

## 2014-09-29 DIAGNOSIS — Z8719 Personal history of other diseases of the digestive system: Secondary | ICD-10-CM

## 2014-09-29 DIAGNOSIS — K51918 Ulcerative colitis, unspecified with other complication: Secondary | ICD-10-CM

## 2014-09-29 DIAGNOSIS — Z960 Presence of urogenital implants: Secondary | ICD-10-CM | POA: Diagnosis not present

## 2014-09-29 DIAGNOSIS — I2510 Atherosclerotic heart disease of native coronary artery without angina pectoris: Secondary | ICD-10-CM | POA: Diagnosis present

## 2014-09-29 DIAGNOSIS — E860 Dehydration: Secondary | ICD-10-CM | POA: Diagnosis present

## 2014-09-29 DIAGNOSIS — E872 Acidosis: Secondary | ICD-10-CM | POA: Diagnosis present

## 2014-09-29 DIAGNOSIS — K529 Noninfective gastroenteritis and colitis, unspecified: Secondary | ICD-10-CM

## 2014-09-29 DIAGNOSIS — K922 Gastrointestinal hemorrhage, unspecified: Secondary | ICD-10-CM | POA: Insufficient documentation

## 2014-09-29 DIAGNOSIS — E782 Mixed hyperlipidemia: Secondary | ICD-10-CM | POA: Diagnosis present

## 2014-09-29 DIAGNOSIS — I679 Cerebrovascular disease, unspecified: Secondary | ICD-10-CM | POA: Diagnosis present

## 2014-09-29 DIAGNOSIS — Z7401 Bed confinement status: Secondary | ICD-10-CM | POA: Diagnosis not present

## 2014-09-29 DIAGNOSIS — M1990 Unspecified osteoarthritis, unspecified site: Secondary | ICD-10-CM | POA: Diagnosis present

## 2014-09-29 DIAGNOSIS — E871 Hypo-osmolality and hyponatremia: Secondary | ICD-10-CM | POA: Diagnosis not present

## 2014-09-29 DIAGNOSIS — D899 Disorder involving the immune mechanism, unspecified: Secondary | ICD-10-CM

## 2014-09-29 DIAGNOSIS — I252 Old myocardial infarction: Secondary | ICD-10-CM | POA: Diagnosis not present

## 2014-09-29 DIAGNOSIS — Z91041 Radiographic dye allergy status: Secondary | ICD-10-CM

## 2014-09-29 DIAGNOSIS — Z932 Ileostomy status: Secondary | ICD-10-CM | POA: Diagnosis not present

## 2014-09-29 DIAGNOSIS — K625 Hemorrhage of anus and rectum: Secondary | ICD-10-CM | POA: Diagnosis not present

## 2014-09-29 DIAGNOSIS — Z881 Allergy status to other antibiotic agents status: Secondary | ICD-10-CM

## 2014-09-29 DIAGNOSIS — K6389 Other specified diseases of intestine: Secondary | ICD-10-CM | POA: Diagnosis present

## 2014-09-29 DIAGNOSIS — Z7982 Long term (current) use of aspirin: Secondary | ICD-10-CM

## 2014-09-29 DIAGNOSIS — I10 Essential (primary) hypertension: Secondary | ICD-10-CM | POA: Diagnosis present

## 2014-09-29 DIAGNOSIS — F419 Anxiety disorder, unspecified: Secondary | ICD-10-CM | POA: Diagnosis present

## 2014-09-29 DIAGNOSIS — K5190 Ulcerative colitis, unspecified, without complications: Secondary | ICD-10-CM | POA: Diagnosis present

## 2014-09-29 DIAGNOSIS — L8990 Pressure ulcer of unspecified site, unspecified stage: Secondary | ICD-10-CM | POA: Insufficient documentation

## 2014-09-29 DIAGNOSIS — R7989 Other specified abnormal findings of blood chemistry: Secondary | ICD-10-CM | POA: Insufficient documentation

## 2014-09-29 DIAGNOSIS — K611 Rectal abscess: Secondary | ICD-10-CM | POA: Diagnosis not present

## 2014-09-29 HISTORY — DX: Rectal abscess: K61.1

## 2014-09-29 LAB — COMPREHENSIVE METABOLIC PANEL
ALK PHOS: 105 U/L (ref 38–126)
ALT: 33 U/L (ref 14–54)
AST: 36 U/L (ref 15–41)
Albumin: 2.5 g/dL — ABNORMAL LOW (ref 3.5–5.0)
Anion gap: 13 (ref 5–15)
BILIRUBIN TOTAL: 0.6 mg/dL (ref 0.3–1.2)
BUN: 7 mg/dL (ref 6–20)
CHLORIDE: 99 mmol/L — AB (ref 101–111)
CO2: 20 mmol/L — AB (ref 22–32)
Calcium: 8.8 mg/dL — ABNORMAL LOW (ref 8.9–10.3)
Creatinine, Ser: 0.59 mg/dL (ref 0.44–1.00)
GFR calc Af Amer: >60 mL/min (ref >60)
GFR calc non Af Amer: >60 mL/min (ref >60)
Glucose, Bld: 86 mg/dL (ref 65–99)
Potassium: 3.6 mmol/L (ref 3.5–5.1)
Sodium: 132 mmol/L — ABNORMAL LOW (ref 135–145)
Total Protein: 6.2 g/dL — ABNORMAL LOW (ref 6.5–8.1)

## 2014-09-29 LAB — GLUCOSE, CAPILLARY
GLUCOSE-CAPILLARY: 114 mg/dL — AB (ref 65–99)
Glucose-Capillary: 101 mg/dL — ABNORMAL HIGH (ref 65–99)
Glucose-Capillary: 84 mg/dL (ref 65–99)

## 2014-09-29 LAB — I-STAT CG4 LACTIC ACID, ED
Lactic Acid, Venous: 2.87 mmol/L (ref 0.5–2.0)
Lactic Acid, Venous: 1.34 mmol/L (ref 0.5–2.0)

## 2014-09-29 LAB — MRSA PCR SCREENING: MRSA BY PCR: NEGATIVE

## 2014-09-29 LAB — ABO/RH: ABO/RH(D): A POS

## 2014-09-29 LAB — TYPE AND SCREEN
ABO/RH(D): A POS
Antibody Screen: NEGATIVE

## 2014-09-29 LAB — CBC
HEMATOCRIT: 32.8 % — AB (ref 36.0–46.0)
HEMOGLOBIN: 10.3 g/dL — AB (ref 12.0–15.0)
MCH: 30.2 pg (ref 26.0–34.0)
MCHC: 31.4 g/dL (ref 30.0–36.0)
MCV: 96.2 fL (ref 78.0–100.0)
Platelets: 197 10*3/uL (ref 150–400)
RBC: 3.41 MIL/uL — AB (ref 3.87–5.11)
RDW: 17.2 % — AB (ref 11.5–15.5)
WBC: 11 10*3/uL — ABNORMAL HIGH (ref 4.0–10.5)

## 2014-09-29 LAB — POC OCCULT BLOOD, ED: Fecal Occult Bld: POSITIVE — AB

## 2014-09-29 LAB — APTT: aPTT: 25 seconds (ref 24–37)

## 2014-09-29 LAB — TROPONIN I

## 2014-09-29 LAB — PROTIME-INR
INR: 1.09 (ref 0.00–1.49)
Prothrombin Time: 14.3 seconds (ref 11.6–15.2)

## 2014-09-29 MED ORDER — SERTRALINE HCL 50 MG PO TABS
50.0000 mg | ORAL_TABLET | Freq: Every day | ORAL | Status: DC
  Administered 2014-09-29: 50 mg via ORAL
  Filled 2014-09-29: qty 1

## 2014-09-29 MED ORDER — SACCHAROMYCES BOULARDII 250 MG PO CAPS
250.0000 mg | ORAL_CAPSULE | Freq: Two times a day (BID) | ORAL | Status: DC
Start: 2014-09-29 — End: 2014-09-29

## 2014-09-29 MED ORDER — SERTRALINE HCL 50 MG PO TABS: 50.0000 mg | ORAL_TABLET | Freq: Every day | ORAL | Status: DC

## 2014-09-29 MED ORDER — PREDNISONE 1 MG PO TABS
2.0000 mg | ORAL_TABLET | Freq: Every day | ORAL | Status: AC
  Administered 2014-10-03 – 2014-10-05 (×3): 2 mg via ORAL
  Filled 2014-09-29 (×5): qty 2

## 2014-09-29 MED ORDER — ACETAMINOPHEN 650 MG RE SUPP: 650.0000 mg | Freq: Four times a day (QID) | RECTAL | Status: DC | PRN

## 2014-09-29 MED ORDER — PSYLLIUM 95 % PO PACK
1.0000 | PACK | Freq: Two times a day (BID) | ORAL | Status: DC
  Administered 2014-09-29 – 2014-10-07 (×12): 1 via ORAL
  Filled 2014-09-29 (×12): qty 1

## 2014-09-29 MED ORDER — MESALAMINE 1.2 G PO TBEC
4.8000 g | DELAYED_RELEASE_TABLET | Freq: Every day | ORAL | Status: DC
  Administered 2014-09-29 – 2014-10-07 (×9): 4.8 g via ORAL
  Filled 2014-09-29 (×11): qty 4

## 2014-09-29 MED ORDER — ONDANSETRON HCL 4 MG/2ML IJ SOLN: 4.0000 mg | Freq: Four times a day (QID) | INTRAMUSCULAR | Status: DC | PRN

## 2014-09-29 MED ORDER — METOPROLOL TARTRATE 25 MG PO TABS
25.0000 mg | ORAL_TABLET | Freq: Two times a day (BID) | ORAL | Status: DC
Start: 2014-09-29 — End: 2014-10-05
  Administered 2014-09-29 – 2014-10-05 (×13): 25 mg via ORAL
  Filled 2014-09-29 (×13): qty 1

## 2014-09-29 MED ORDER — PREDNISONE 5 MG PO TABS
6.0000 mg | ORAL_TABLET | Freq: Every day | ORAL | Status: DC
  Filled 2014-09-29: qty 1

## 2014-09-29 MED ORDER — FLUCONAZOLE 100 MG PO TABS: 400.0000 mg | ORAL_TABLET | Freq: Every day | ORAL | Status: DC

## 2014-09-29 MED ORDER — MESALAMINE 1.2 G PO TBEC
4.8000 g | DELAYED_RELEASE_TABLET | Freq: Every day | ORAL | Status: DC
Start: 2014-09-29 — End: 2014-09-29
  Filled 2014-09-29: qty 4

## 2014-09-29 MED ORDER — ACETAMINOPHEN 325 MG PO TABS
650.0000 mg | ORAL_TABLET | Freq: Four times a day (QID) | ORAL | Status: DC | PRN
  Administered 2014-10-01: 650 mg via ORAL
  Filled 2014-09-29: qty 2

## 2014-09-29 MED ORDER — MORPHINE SULFATE 2 MG/ML IJ SOLN
2.0000 mg | INTRAMUSCULAR | Status: DC | PRN
  Administered 2014-09-29 – 2014-10-07 (×21): 2 mg via INTRAVENOUS
  Filled 2014-09-29 (×21): qty 1

## 2014-09-29 MED ORDER — IOHEXOL 300 MG/ML  SOLN
25.0000 mL | INTRAMUSCULAR | Status: AC
  Administered 2014-09-29 (×2): 25 mL via ORAL

## 2014-09-29 MED ORDER — OXYCODONE HCL 5 MG PO TABS
5.0000 mg | ORAL_TABLET | ORAL | Status: DC | PRN
  Administered 2014-09-29 – 2014-10-07 (×5): 5 mg via ORAL
  Filled 2014-09-29 (×5): qty 1

## 2014-09-29 MED ORDER — DIPHENHYDRAMINE HCL 50 MG/ML IJ SOLN
50.0000 mg | Freq: Once | INTRAMUSCULAR | Status: AC
  Administered 2014-09-29: 50 mg via INTRAVENOUS
  Filled 2014-09-29: qty 1

## 2014-09-29 MED ORDER — AMOXICILLIN-POT CLAVULANATE 875-125 MG PO TABS: 1.0000 | ORAL_TABLET | Freq: Two times a day (BID) | ORAL | Status: DC

## 2014-09-29 MED ORDER — PREDNISONE 1 MG PO TABS: 0.5000 mg | ORAL_TABLET | Freq: Every day | ORAL | Status: DC

## 2014-09-29 MED ORDER — AMLODIPINE BESYLATE 10 MG PO TABS: 10.0000 mg | ORAL_TABLET | Freq: Every day | ORAL | Status: DC

## 2014-09-29 MED ORDER — AMOXICILLIN-POT CLAVULANATE 875-125 MG PO TABS
1.0000 | ORAL_TABLET | Freq: Two times a day (BID) | ORAL | Status: DC
  Administered 2014-09-29 – 2014-10-02 (×7): 1 via ORAL
  Filled 2014-09-29 (×7): qty 1

## 2014-09-29 MED ORDER — METOPROLOL TARTRATE 25 MG PO TABS: 25.0000 mg | ORAL_TABLET | Freq: Two times a day (BID) | ORAL | Status: DC

## 2014-09-29 MED ORDER — MORPHINE SULFATE 2 MG/ML IJ SOLN
2.0000 mg | Freq: Once | INTRAMUSCULAR | Status: AC
  Administered 2014-09-29: 2 mg via INTRAVENOUS
  Filled 2014-09-29: qty 1

## 2014-09-29 MED ORDER — AMLODIPINE BESYLATE 10 MG PO TABS
10.0000 mg | ORAL_TABLET | Freq: Every day | ORAL | Status: DC
  Administered 2014-09-29 – 2014-10-05 (×7): 10 mg via ORAL
  Filled 2014-09-29 (×7): qty 1

## 2014-09-29 MED ORDER — ONDANSETRON HCL 4 MG PO TABS: 4.0000 mg | ORAL_TABLET | Freq: Four times a day (QID) | ORAL | Status: DC | PRN

## 2014-09-29 MED ORDER — PSYLLIUM 95 % PO PACK: 1.0000 | PACK | Freq: Two times a day (BID) | ORAL | Status: DC

## 2014-09-29 MED ORDER — IOHEXOL 300 MG/ML  SOLN
100.0000 mL | Freq: Once | INTRAMUSCULAR | Status: AC | PRN
  Administered 2014-09-29: 100 mL via INTRAVENOUS

## 2014-09-29 MED ORDER — INSULIN ASPART 100 UNIT/ML ~~LOC~~ SOLN
0.0000 [IU] | SUBCUTANEOUS | Status: DC
  Administered 2014-09-30 – 2014-10-01 (×6): 1 [IU] via SUBCUTANEOUS
  Administered 2014-10-03: 2 [IU] via SUBCUTANEOUS
  Administered 2014-10-06: 1 [IU] via SUBCUTANEOUS

## 2014-09-29 MED ORDER — SERTRALINE HCL 50 MG PO TABS
25.0000 mg | ORAL_TABLET | Freq: Every day | ORAL | Status: AC
  Administered 2014-09-30 – 2014-10-04 (×5): 25 mg via ORAL
  Filled 2014-09-29 (×5): qty 1

## 2014-09-29 MED ORDER — PREDNISONE 1 MG PO TABS
4.0000 mg | ORAL_TABLET | Freq: Every day | ORAL | Status: AC
  Administered 2014-09-30 – 2014-10-02 (×3): 4 mg via ORAL
  Filled 2014-09-29 (×4): qty 4

## 2014-09-29 MED ORDER — ALPRAZOLAM 0.5 MG PO TABS
0.5000 mg | ORAL_TABLET | Freq: Two times a day (BID) | ORAL | Status: DC | PRN
  Administered 2014-10-01 – 2014-10-07 (×9): 0.5 mg via ORAL
  Filled 2014-09-29 (×9): qty 1

## 2014-09-29 MED ORDER — SODIUM CHLORIDE 0.9 % IV BOLUS (SEPSIS)
1000.0000 mL | Freq: Once | INTRAVENOUS | Status: AC
  Administered 2014-09-29: 1000 mL via INTRAVENOUS

## 2014-09-29 MED ORDER — SERTRALINE HCL 50 MG PO TABS
50.0000 mg | ORAL_TABLET | Freq: Every day | ORAL | Status: DC
  Administered 2014-10-05 – 2014-10-07 (×3): 50 mg via ORAL
  Filled 2014-09-29 (×3): qty 1

## 2014-09-29 MED ORDER — PREDNISONE 1 MG PO TABS
1.0000 mg | ORAL_TABLET | Freq: Every day | ORAL | Status: DC
  Administered 2014-10-06 – 2014-10-07 (×2): 1 mg via ORAL
  Filled 2014-09-29 (×3): qty 1

## 2014-09-29 MED ORDER — SODIUM CHLORIDE 0.9 % IJ SOLN
3.0000 mL | Freq: Two times a day (BID) | INTRAMUSCULAR | Status: DC
  Administered 2014-09-29 – 2014-10-05 (×6): 3 mL via INTRAVENOUS

## 2014-09-29 MED ORDER — POTASSIUM CHLORIDE IN NACL 20-0.9 MEQ/L-% IV SOLN
INTRAVENOUS | Status: DC
  Administered 2014-09-29 – 2014-10-06 (×9): via INTRAVENOUS
  Filled 2014-09-29 (×17): qty 1000

## 2014-09-29 MED ORDER — ROSUVASTATIN CALCIUM 20 MG PO TABS
40.0000 mg | ORAL_TABLET | Freq: Every day | ORAL | Status: DC
  Administered 2014-09-29 – 2014-10-06 (×8): 40 mg via ORAL
  Filled 2014-09-29 (×8): qty 2

## 2014-09-29 MED ORDER — METHYLPREDNISOLONE SODIUM SUCC 125 MG IJ SOLR
125.0000 mg | Freq: Once | INTRAMUSCULAR | Status: AC
  Administered 2014-09-29: 125 mg via INTRAVENOUS
  Filled 2014-09-29: qty 2

## 2014-09-29 MED ORDER — SACCHAROMYCES BOULARDII 250 MG PO CAPS
250.0000 mg | ORAL_CAPSULE | Freq: Two times a day (BID) | ORAL | Status: DC
  Administered 2014-09-29 – 2014-10-07 (×17): 250 mg via ORAL
  Filled 2014-09-29 (×17): qty 1

## 2014-09-29 MED ORDER — FLUCONAZOLE 100 MG PO TABS
400.0000 mg | ORAL_TABLET | Freq: Every day | ORAL | Status: DC
  Administered 2014-09-29 – 2014-10-05 (×7): 400 mg via ORAL
  Filled 2014-09-29 (×7): qty 4

## 2014-09-29 NOTE — ED Provider Notes (Signed)
CSN: [id number]     Arrival date & time 09/29/14  0704 History   First MD Initiated Contact with Patient 09/29/14 [phone]     Chief Complaint  Patient presents with  . Rectal Bleeding     (Consider location/radiation/quality/duration/timing/severity/associated sxs/prior Treatment) HPI 74 year old female who presents with rectal bleeding. She has a history of CAD, diabetes, prior CVA, inflammatory bowel disease on Remicade therapy. She had a prolonged hospitalization in June of June 20 through July 4 for a IBD flare. She was readmitted in August for management of rectal abscess, that was drained but complicated by significant rectal bleeding, with fistula formation. She did undergo dying diverting ileostomy during this time, and was discharged to senior nursing facility with antibiotics and prednisone taper.  She was brought in from the nursing facility today after nursing staff found significant amount of blood and clot on her bed. Patient is a poor historian, and unable to give [place] further details of this. She denies any abdominal pain, stool out of her colostomy, fevers, nausea or vomiting, chest pain or difficulty breathing.    Past Medical History  Diagnosis Date  . CAD (coronary artery disease)     unspecified  . Cerebrovascular disease     bil carotid artery disease.   Marland Kitchen Hyperlipidemia, mixed   . HTN (hypertension)   . Ulcerative colitis   . Diverticulitis   . Colon polyps 2006  . Thyroid disease   . Myocardial infarct   . Carotid artery occlusion 2010  . Diabetes mellitus without complication     Borderline  . GERD (gastroesophageal reflux disease)   . Arthritis    Past Surgical History  Procedure Laterality Date  . Cholecystectomy  7.06.2008    with cholangiogram  . 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 Abdominal hysterectomy    . Colonoscopy  multiple  . Sigmoidoscopy  multiple  . Flexible sigmoidoscopy N/A 08/08/2014    Procedure: FLEXIBLE SIGMOIDOSCOPY;   Surgeon: Gatha Mayer, MD;  Location: [place];  Service: Endoscopy;  Laterality: N/A;  . Incision and drainage perirectal abscess N/A 08/22/2014    Procedure: IRRIGATION AND DEBRIDEMENT PERIRECTAL ABSCESS;  Surgeon: Jackolyn Confer, MD;  [facility];  Service: General;  Laterality: N/A;  . Joint replacement  left knee  . Flexible sigmoidoscopy N/A 09/06/2014    Procedure: FLEXIBLE SIGMOIDOSCOPY;  Surgeon: Lafayette Dragon, MD;  [facility];  Service: Endoscopy;  Laterality: N/A;  . Laparoscopic diverted colostomy N/A 09/13/2014    Procedure: LAPAROSCOPIC DIVERTED ILEOSTOMY;  Surgeon: Stark Klein, MD;  [facility];  Service: General;  Laterality: N/A;  . Rectal exam under anesthesia N/A 09/13/2014    Procedure: RECTAL EXAM UNDER ANESTHESIA  AND DEBRIDEMENT OF PERIANAL WOUND;  Surgeon: Stark Klein, MD;  [facility];  Service: General;  Laterality: N/A;   Family History  Problem Relation Age of Onset  . Colitis Brother     undefined  . Stomach cancer Mother   . Stroke Father     died  . Colon cancer Neg Hx    Social History  Substance Use Topics  . Smoking status: Former Smoker -- 0.30 packs/day for 40 years    Types: Cigarettes    Quit date: 02/17/2013  . Smokeless tobacco: Never Used  . Alcohol Use: No   OB History    No data available     Review of Systems 10/14 systems reviewed and are negative other than those stated in the HPI  Allergies  Clindamycin/lincomycin and Ivp dye  Home Medications   Prior to Admission medications   Medication Sig Start Date End Date Taking? Authorizing Provider  acetaminophen (TYLENOL) 500 MG tablet Take 2 tablets (1,000 mg total) by mouth every 4 (four) hours as needed for mild pain or moderate pain. 08/28/14   Robbie Lis, MD  ALPRAZolam Duanne Moron) 0.5 MG tablet Take 1 tablet (0.5 mg total) by mouth 2 (two) times daily as needed for anxiety. 09/21/14   Silver Huguenin Elgergawy, MD  amLODipine (NORVASC) 10 MG tablet Take 1  tablet (10 mg total) by mouth daily. 09/21/14   Albertine Patricia, MD  amoxicillin-clavulanate (AUGMENTIN) 875-125 MG per tablet Take 1 tablet by mouth 2 (two) times daily. Patient will need to continue with oral Augmentin until she is seen in GI in 2 weeks, so continue for 3 weeks. 09/21/14   Silver Huguenin Elgergawy, MD  CRESTOR 40 MG tablet TAKE 1 TABLET AT BEDTIME Patient taking differently: TAKE 40 MG BY MOUTH AT BEDTIME 05/17/14   Lelon Perla, MD  feeding supplement (BOOST / RESOURCE BREEZE) LIQD Take 1 Container by mouth 3 (three) times daily between meals. 09/21/14   Silver Huguenin Elgergawy, MD  fluconazole (DIFLUCAN) 200 MG tablet Take 2 tablets (400 mg total) by mouth daily. Patient will need to continue with oral Augmentin until she is seen in GI in 2 weeks, so continue for 3 weeks. 09/21/14   Silver Huguenin Elgergawy, MD  mesalamine (LIALDA) 1.2 G EC tablet take 4 tablets by mouth once daily Patient taking differently: Take 4.8 g by mouth daily.  07/31/14   Inda Castle, MD  metoprolol tartrate (LOPRESSOR) 25 MG tablet take 1 tablet by mouth twice a day Patient taking differently: TAKE 25 MG BY MOUTH TWICE DAILY 05/18/14   Lelon Perla, MD  oxyCODONE (OXY IR/ROXICODONE) 5 MG immediate release tablet Take 1 tablet (5 mg total) by mouth every 4 (four) hours as needed for moderate pain, severe pain or breakthrough pain. 08/28/14   Robbie Lis, MD  predniSONE (DELTASONE) 1 MG tablet Take 4 tablets (4 mg total) by mouth daily with breakfast. For 3 doses 09/30/14   Albertine Patricia, MD  predniSONE (DELTASONE) 1 MG tablet Take 2 tablets (2 mg total) by mouth daily with breakfast. For 3 doses 10/03/14   Albertine Patricia, MD  predniSONE (DELTASONE) 1 MG tablet Take 6 tablets (6 mg total) by mouth daily with breakfast. For 3 doses 09/27/14   Albertine Patricia, MD  predniSONE (DELTASONE) 1 MG tablet Take 1 tablet (1 mg total) by mouth daily with breakfast. For 3 doses 10/06/14   Albertine Patricia, MD  predniSONE  (DELTASONE) 1 MG tablet Take 0.5 tablets (0.5 mg total) by mouth daily with breakfast. For 3 doses 10/09/14   Albertine Patricia, MD  predniSONE (DELTASONE) 10 MG tablet Take 1 tablet (10 mg total) by mouth daily with breakfast. For 3 doses 09/21/14   Silver Huguenin Elgergawy, MD  psyllium (HYDROCIL/METAMUCIL) 95 % PACK Take 1 packet by mouth 2 (two) times daily. 08/28/14   Robbie Lis, MD  saccharomyces boulardii (FLORASTOR) 250 MG capsule Take 1 capsule (250 mg total) by mouth 2 (two) times daily. 08/03/14   Amy S [name], PA-C  sertraline (ZOLOFT) 50 MG tablet Take 1 tablet (50 mg total) by mouth daily. 09/27/14   Lauree Chandler, NP   BP 122/69 mmHg  Pulse 97  Temp(Src) 98 F (36.7  C) (Oral)  Resp 18  Ht 5\' 9"  (1.753 m)  Wt 206 lb (93.441 kg)  BMI 30.41 kg/m2  SpO2 97% Physical Exam Physical Exam  Nursing note and vitals reviewed. Constitutional: Chronically ill-appearing woman, nontoxic in no acute distress Head: Normocephalic and atraumatic.  Mouth/Throat: Oropharynx is clear, but dry.  Neck: Normal range of motion. Neck supple.  Cardiovascular: Normal rate and regular rhythm.   Pulmonary/Chest: Effort normal and breath sounds normal.  Abdominal: Soft. Obese. Non-distended. Ileostomy in the LLQ with light brown stool. Guaiac negative. There is no tenderness. There is no rebound and no guarding. This is large fistulizing wound perirectally with no active bleeding or exudates.   Musculoskeletal: Normal range of motion.  Neurological: Alert, no facial droop, fluent speech, moves all extremities symmetrically Skin: Skin is warm and dry.  Psychiatric: Cooperative  ED Course  Procedures (including critical care time) Labs Review Labs Reviewed  COMPREHENSIVE METABOLIC PANEL - Abnormal; Notable for the following:    Sodium 132 (*)    Chloride 99 (*)    CO2 20 (*)    Calcium 8.8 (*)    Total Protein 6.2 (*)    Albumin 2.5 (*)    All other components within normal limits  CBC -  Abnormal; Notable for the following:    WBC 11.0 (*)    RBC 3.41 (*)    Hemoglobin 10.3 (*)    HCT 32.8 (*)    RDW 17.2 (*)    All other components within normal limits  GLUCOSE, CAPILLARY - Abnormal; Notable for the following:    Glucose-Capillary 101 (*)    All other components within normal limits  I-STAT CG4 LACTIC ACID, ED - Abnormal; Notable for the following:    Lactic Acid, Venous 2.87 (*)    All other components within normal limits  POC OCCULT BLOOD, ED - Abnormal; Notable for the following:    Fecal Occult Bld POSITIVE (*)    All other components within normal limits  MRSA PCR SCREENING  URINE CULTURE  PROTIME-INR  APTT  TROPONIN I  GLUCOSE, CAPILLARY  HEMOGLOBIN Z6O  BASIC METABOLIC PANEL  CBC  I-STAT CG4 LACTIC ACID, ED  TYPE AND SCREEN  ABO/RH    Imaging Review No results found. I, Forde Dandy, personally reviewed and evaluated these images and lab results as part of my medical decision-making.   EKG Interpretation   Date/Time:  Friday September 29 2014 07:54:34 EDT Ventricular Rate:  93 PR Interval:  188 QRS Duration: 103 QT Interval:  360 QTC Calculation: 448 R Axis:   33 Text Interpretation:  Normal sinus rhythm No significant change from prior   Confirmed by Sriyan Cutting MD, Abbegale Stehle [id number]) on 09/29/2014 8:07:51 AM      MDM   Final diagnoses:  Rectal bleeding  Inflammatory bowel disease  Immunosuppressed status  Elevated lactic acid level  Rectal fistula     In short, this 74 year old female with history of inflammatory bowel disease on Remicade therapy status post diverting loop ileostomy with rectal fistula who presents with rectal bleeding. She is chronically ill-appearing on presentation, but nontoxic and in no acute distress. Her abdomen is soft, and she has normal stool out of her ostomy. She is no further active rectal bleeding in the emergency department, but is noted to have trace blood within rectum. Rectal fistula shows no stigmata of  bleeding. She remains hemodynamically stable with stable hemoglobin since last admission. Lactate elevated to 2.87 and may reflect mild hypoperfusion vs  dehydration. Given 1 L of NS. No other evidence of end organ damage. Clinical presentation is not consistent with infection/sepsis. Discussed with [place] gastroenterology, at this time we'll hold off on CT. Admitted to the hospitalist service under telemetry for serial hemoglobins and GI consult.     Forde Dandy, MD 09/29/14 [phone]

## 2014-09-29 NOTE — Consult Note (Addendum)
WOC wound consult note Reason for Consult: Chronic wound, healing. Last seen on 09/19/14 by this Probation officer. Wound type: Infectious Pressure Ulcer POA: No Measurement:5cm x 2cm x 4.5 cm (improved over last admission).  Wound is 100% clean, pink, granulating. Wound bed:red, moist Drainage (amount, consistency, odor) thick, light yellow, no odor Periwound:Intact, slightly macerated (periwound MASD) Dressing procedure/placement/frequency:I will continue twice daily and have provided guidance for Nursing via the Orders Section in this record.  Note:  There are several partial thickness open areas in the sacral region due to moisture and friction, the largest of which measures 1.5cm x 1cm x 0.2cm.  I have provided orders for use of the sacral foam dressing to protect this area and also positioning guidance.  WOC ostomy consult note: Stoma is not seen today as patient is currently moving onto therapeutic mattress with low air loss feature at the time of my assessment. Family (son and daughter-in-law) report no issues with ostomy at this time so I will continue orders per last admission. Stoma type/location: RLQ ileostomy Stomal assessment/size: (last admission measurement) 1 and 1/4 inch oval at rest Peristomal assessment: not seen today Treatment options for stomal/peristomal skin: Skin barrier ring Output Brown soft stool Ostomy pouching: 2pc. 2 and 1/4 inch pouching system with skin barrier ring.  Thank you for this consultation. The [place] nursing team will not follow, but will remain available to this patient, the nursing and medical teams.  Please re-consult if needed. Thanks, Maudie Flakes, MSN, RN, [place], [place], [place] [phone])

## 2014-09-29 NOTE — Consult Note (Signed)
[facility] Consult: 10:23 AM 09/29/2014  LOS: 0 days    Referring Provider: Dr Oleta Mouse Primary Care Physician:  Kandice Hams, MD Primary Gastroenterologist:  Dr. Deatra Ina.     Reason for Consultation:  Bleeding per rectum.    HPI: [name] is a 74 y.o. female.  PMH CAD, MI, CABG 2006, bil carotid artery disease/sp CEA, glucose intolerance/"prediabetes".  Hx CVA, s/p IVC filter.  Left sided UC, diagnosed 2006. On chronic Lialda 4.8 grams daily.  Colonoscopy Sept 2014:scattered left sided tics and focal left sided colitis, mild cryptitis on Bx.   Admissions for worsening UC, bloody diarrhea, starting 6/20 - 08/21/14. Treated with proctofoam, probiotic, steroids, briefly on vancomycin po and IV Flagyl. Discharged on prednisone 20 mg daily, lialda 4.8 grams daily, Florastor .   08/08/14 Flex Sig: diffuse edema to 40 cm, biopsies:   .  Pseudomembranes over inflamed but mostly normal tissue 40 to 70 cm.   Readmitted 7/5 - 09/21/14 with perirectal abscess, escalating rectal pain.  Developed DVT, s/p IVC filter 7/13.  Required nutritional support with TNA but tolerating po diet at discharge.  08/22/14 CT abdomen pelvis with contrast showing extensive gas in the right gluteal fat and musculature extending throughout the retroperitoneum.  Thickening, consistent with colitis in the distal transverse and descending colon. 08/23/14: I & D of complex perirectal abscess.  09/06/14 Flex Sig: severe left sided colitis from anal canal to splenic flexure/80 cm.  Bleeding from large ulcerations in left colon.  09/13/14: Laparoscopic diverting ileostomy, exam under anesthesia.  Normal TI and cecum.  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  Remicade initiated 7/15, 2nd dose 8/2, 3rd dose due  9/1.  Discharged to SNF on Augmentin, Lialda, florastor, prednisone taper through 8/25.  Infectious disease MD felt she would need another 2 to 3 weeks of abx.    Has 8/22 GI follow up with Dr Deatra Ina and 9/1 Remicade infusion set up.   Sent to ED from NH with bleeding from the region of the rectum. It is unclear whether this was actually coming from the rectum or from the open wound in the right perirectal region. There has been no bleeding from the ileostomy. Patient has some shallow sacral region decubitus ulcers which also appear to be mildly bleeding. Patient is having stable pelvic pain. However on exam there is apparently new tenderness in the right lower abdomen. No nausea. Family reports that the patient has been eating well. There's been no nausea. She does feel chills and was covered with multiple flannel blankets in the emergency room. Physically the patient has not been making progress. She does get up to a bedside chair but has not been walking at all since discharge. She feels weak. She has a chronic indwelling Foley catheter in place.  No blood is seen in the Foley.  Hemoglobin today is 10.3, this is improved from hemoglobins ranging from 8.7-9.5 2 weeks ago. WBCs 13.2. On recheck these are 11. Albumen is 2.5 which is improved  compared with 2 weeks ago. Sodium is 132 which is slightly improved compared with previous levels Lactate level 2.87 initially but on recheck is 1.34.  Past Medical History  Diagnosis Date  . CAD (coronary artery disease)     unspecified  . Cerebrovascular disease   . Hyperlipidemia, mixed   . HTN (hypertension)   . Ulcerative colitis   . Diverticulitis   . Colon polyps 2006  . Thyroid disease   . Myocardial infarct   . Carotid artery occlusion 2010  . Diabetes mellitus without complication     Borderline  . GERD (gastroesophageal reflux disease)   . Arthritis     Past Surgical History  Procedure Laterality Date  . Cholecystectomy  7.06.2008      with cholangiogram  . 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 Abdominal hysterectomy    . Colonoscopy  multiple  . Sigmoidoscopy  multiple  . Flexible sigmoidoscopy N/A 08/08/2014    Procedure: FLEXIBLE SIGMOIDOSCOPY;  Surgeon: Gatha Mayer, MD;  Location: [facility];  Service: Endoscopy;  Laterality: N/A;  . Incision and drainage perirectal abscess N/A 08/22/2014    Procedure: IRRIGATION AND DEBRIDEMENT PERIRECTAL ABSCESS;  Surgeon: Jackolyn Confer, MD;  Location: [facility];  Service: General;  Laterality: N/A;  . Joint replacement  left knee  . Flexible sigmoidoscopy N/A 09/06/2014    Procedure: FLEXIBLE SIGMOIDOSCOPY;  Surgeon: Lafayette Dragon, MD;  Location: [facility];  Service: Endoscopy;  Laterality: N/A;  . Laparoscopic diverted colostomy N/A 09/13/2014    Procedure: LAPAROSCOPIC DIVERTED ILEOSTOMY;  Surgeon: Stark Klein, MD;  Location: [facility];  Service: General;  Laterality: N/A;  . Rectal exam under anesthesia N/A 09/13/2014    Procedure: RECTAL EXAM UNDER ANESTHESIA  AND DEBRIDEMENT OF PERIANAL WOUND;  Surgeon: Stark Klein, MD;  Location: [facility];  Service: General;  Laterality: N/A;    Prior to Admission medications   Medication Sig Start Date End Date Taking? Authorizing Provider  acetaminophen (TYLENOL) 500 MG tablet Take 2 tablets (1,000 mg total) by mouth every 4 (four) hours as needed for mild pain or moderate pain. 08/28/14   Robbie Lis, MD  ALPRAZolam Duanne Moron) 0.5 MG tablet Take 1 tablet (0.5 mg total) by mouth 2 (two) times daily as needed for anxiety. 09/21/14   Silver Huguenin Elgergawy, MD  amLODipine (NORVASC) 10 MG tablet Take 1 tablet (10 mg total) by mouth daily. 09/21/14   Albertine Patricia, MD  amoxicillin-clavulanate (AUGMENTIN) 875-125 MG per tablet Take 1 tablet by mouth 2 (two) times daily. Patient will need to continue with oral Augmentin until she is seen in GI in 2 weeks, so continue for 3 weeks. 09/21/14   Silver Huguenin Elgergawy, MD  CRESTOR 40 MG tablet TAKE 1 TABLET  AT BEDTIME Patient taking differently: TAKE 40 MG BY MOUTH AT BEDTIME 05/17/14   Lelon Perla, MD  feeding supplement (BOOST / RESOURCE BREEZE) LIQD Take 1 Container by mouth 3 (three) times daily between meals. 09/21/14   Silver Huguenin Elgergawy, MD  fluconazole (DIFLUCAN) 200 MG tablet Take 2 tablets (400 mg total) by mouth daily. Patient will need to continue with oral Augmentin until she is seen in GI in 2 weeks, so continue for 3 weeks. 09/21/14   Silver Huguenin Elgergawy, MD  mesalamine (LIALDA) 1.2 G EC tablet take 4 tablets by mouth once daily Patient taking differently: Take 4.8 g by mouth daily.  07/31/14   Inda Castle, MD  metoprolol tartrate (LOPRESSOR) 25 MG  tablet take 1 tablet by mouth twice a day Patient taking differently: TAKE 25 MG BY MOUTH TWICE DAILY 05/18/14   Lelon Perla, MD  oxyCODONE (OXY IR/ROXICODONE) 5 MG immediate release tablet Take 1 tablet (5 mg total) by mouth every 4 (four) hours as needed for moderate pain, severe pain or breakthrough pain. 08/28/14   Robbie Lis, MD  predniSONE (DELTASONE) 1 MG tablet Take 4 tablets (4 mg total) by mouth daily with breakfast. For 3 doses 09/30/14   Albertine Patricia, MD  predniSONE (DELTASONE) 1 MG tablet Take 2 tablets (2 mg total) by mouth daily with breakfast. For 3 doses 10/03/14   Albertine Patricia, MD  predniSONE (DELTASONE) 1 MG tablet Take 6 tablets (6 mg total) by mouth daily with breakfast. For 3 doses 09/27/14   Albertine Patricia, MD  predniSONE (DELTASONE) 1 MG tablet Take 1 tablet (1 mg total) by mouth daily with breakfast. For 3 doses 10/06/14   Albertine Patricia, MD  predniSONE (DELTASONE) 1 MG tablet Take 0.5 tablets (0.5 mg total) by mouth daily with breakfast. For 3 doses 10/09/14   Albertine Patricia, MD  predniSONE (DELTASONE) 10 MG tablet Take 1 tablet (10 mg total) by mouth daily with breakfast. For 3 doses 09/21/14   Silver Huguenin Elgergawy, MD  psyllium (HYDROCIL/METAMUCIL) 95 % PACK Take 1 packet by mouth 2 (two) times  daily. 08/28/14   Robbie Lis, MD  saccharomyces boulardii (FLORASTOR) 250 MG capsule Take 1 capsule (250 mg total) by mouth 2 (two) times daily. 08/03/14   Amy S [name], PA-C  sertraline (ZOLOFT) 50 MG tablet Take 1 tablet (50 mg total) by mouth daily. 09/27/14   Lauree Chandler, NP       Allergies as of 09/29/2014 - Review Complete 09/29/2014  Allergen Reaction Noted  . Clindamycin/lincomycin Diarrhea 08/07/2014  . Ivp dye [iodinated diagnostic agents] Other (See Comments) 01/14/2011    Family History  Problem Relation Age of Onset  . Colitis Brother     undefined  . Stomach cancer Mother   . Stroke Father     died  . Colon cancer Neg Hx     Social History   Social History  . Marital Status: Married    Spouse Name: N/A  . Number of Children: N/A  . Years of Education: N/A   Occupational History  . retired    Social History Main Topics  . Smoking status: Former Smoker -- 0.30 packs/day for 40 years    Types: Cigarettes    Quit date: 02/17/2013  . Smokeless tobacco: Never Used  . Alcohol Use: No  . Drug Use: No  . Sexual Activity: Not on file   Other Topics Concern  . Not on file   Social History Narrative    REVIEW OF SYSTEMS: Constitutional:  Per HPI ENT:  No nose bleeds Pulm:  No SOB or cough CV:  No palpitations, no LE edema. No chest pain GU:  No hematuria, no frequency GI:  Per HPI Heme:  Other than the rectal/perirectal bleeding, there has been no unusual bleeding from other locations on the body.   Transfusions:  No records of previous blood transfusions. Neuro:  No headaches, no peripheral tingling or numbness Derm:  No itching, no rash or sores.  Endocrine:  No sweats or chills.  No polyuria or dysuria Immunization:  Not queried. Travel:  None beyond local counties in last few months.    PHYSICAL EXAM: Vital signs  in last 24 hours: Filed Vitals:   09/29/14 0945  BP: 107/54  Pulse:   Temp:   Resp:    Wt Readings from Last 3  Encounters:  09/29/14 206 lb (93.441 kg)  09/21/14 217 lb 13 oz (98.8 kg)  08/07/14 229 lb 12.8 oz (104.237 kg)    General: Patient certainly looks frailer/weaker than when I first met her in June. She is moaning but has no specific complaint. Head:  No facial asymmetry or edema. No signs of head trauma.  Eyes:  No scleral icterus, no conjunctival pallor. EOMI. Ears:  No hearing deficit  Nose:  No congestion or discharge. No sneezing. Mouth:  Mucous membranes are slightly pale but moist and clear. Dentition in good repair. No signs of Candida. Neck:  No JVD, no masses, no TMG. Lungs:  No labored breathing or cough. Clear to auscultation bilaterally with good breath sounds. Heart: RRR. No MRG. Abdomen:  Soft. Liquid medium brown stool in the ostomy.  Mild tenderness and palpable, firm fullness in the right abdomen.   Rectal:  Gauze packing noted in the perirectal abscess cavity to the right, this has a minimal amount of pink tinge. Digital exam of rectum notable for some tenderness and a spec of red blood. No masses. No fluctuance.  Shallow, stage I, sacral skin breakdown with a small amount of blood seen on the overlying bandages.   Musc/Skeltl: No joint erythema swelling or contracture. Extremities:  No CCE. Feet are warm.  Neurologic:  Patient oriented to place, year and situation. Moves all 4 limbs. Limb strength is grossly 5 over 5 in upper and lower extremities. Skin:  No telangiectasia. Other than the sacral skin breakdown, no other sores or rashes. Tattoos:  None Nodes:  No cervical adenopathy.   Psych:  Cooperative, oriented 3.  Alert, though a bit drowsy   LAB RESULTS:  Recent Labs  09/29/14 0754  WBC 11.0*  HGB 10.3*  HCT 32.8*  PLT 197   BMET Lab Results  Component Value Date   NA 132* 09/29/2014   NA 132* 09/21/2014   NA 130* 09/20/2014   K 3.6 09/29/2014   K 4.2 09/21/2014   K 4.1 09/20/2014   CL 99* 09/29/2014   CL 100* 09/21/2014   CL 98* 09/20/2014    CO2 20* 09/29/2014   CO2 26 09/21/2014   CO2 25 09/20/2014   GLUCOSE 86 09/29/2014   GLUCOSE 101* 09/21/2014   GLUCOSE 150* 09/20/2014   BUN 7 09/29/2014   BUN 16 09/21/2014   BUN 16 09/20/2014   CREATININE 0.59 09/29/2014   CREATININE 0.45 09/21/2014   CREATININE 0.46 09/20/2014   CALCIUM 8.8* 09/29/2014   CALCIUM 8.3* 09/21/2014   CALCIUM 7.9* 09/20/2014   LFT  Recent Labs  09/29/14 0754  PROT 6.2*  ALBUMIN 2.5*  AST 36  ALT 33  ALKPHOS 105  BILITOT 0.6   Lab Results  Component Value Date   INR 1.09 09/29/2014   ENDOSCOPIC STUDIES: Per HPI  IMPRESSION:   *  Bleeding per rectum.  Not clear if this is actually coming from the rectum or from the abscess, fistula cavity Complex pt with hx severe UC, associated with bleeding and perirectal abscess and fistula.  S/p abscess I and D and, ultimately, diverting ileostomy in 08/2014.  Remicade initiated during recent hospitalization. 2 infusions thus far and next infusion is due on 10/19/14 Patient is tender and there is a palpable fullness/mass in the right abdomen. Wonder if she  needs reimaging with a CT scan. Her last CT was 5 weeks ago  *  DVT .  S/p 08/30/14 IVC filter.     PLAN:     *  CT scan. The question is whether to re-scan her. For now leave her on the Augmentin, Lialda, and her current dose of 6 mg of prednisone daily.  *  Does not need q 6 hour CBC, will do this in AM.      Azucena Freed  09/29/2014, 10:23 AM Pager: [place] Attending  I have also seen and assessed the patient and agree with the advanced practitioner's assessment and plan. My Hx and PE same  We have ordered Ct abd/pelvis w/ contrast to assess her further.  There is an IVP dye  allergy on chart but reaction was "almost passed out" and she has had IV iohexel contrast recently no problems and no premed.  Gatha Mayer, MD, [facility] [phone] (pager) 09/29/2014 3:09 PM

## 2014-09-29 NOTE — Progress Notes (Addendum)
Admission note:  Arrival Method: Patient arrived in stretcher from ED with family and staff accompanying her. Mental Orientation:  A & O x 4. Telemetry: NSR, [id number]. Assessment: See doc flow sheets. Skin: Assessed by two nurses, stage 2 on the right buttock and also stage 3 with some tunneling noted on the right perirectal area. Bruising noted on the right arm. IV: Left AC NS with potassium 125 ml/hr. Pain:  Tubes: Chronic indwelling foley catheter, came with it to the hospital. Safety Measures: Air mattress bed in low position, refused to watch safety video, phone and call bell within reach. Fall Prevention Safety Plan: Reviewed the plan, understood and acknowledged. Admission Screening: In progress. 6700 Orientation: Patient has been oriented to the unit, staff and to the room.

## 2014-09-29 NOTE — Clinical Social Work Note (Signed)
Clinical Social Work Assessment  Patient Details  Name: [name] MRN: [id number] Date of Birth: 08/10/1940  Date of referral:  09/29/14               Reason for consult:  Other (Comment Required) (From [place])                Permission sought to share information with:  Case Manager, Customer service manager, Family Supports Permission granted to share information::  Yes, Verbal Permission Granted  Name::        Agency::  [facility]  Relationship::  Son, Daughter in [place], Daughters  (Ava and Romona)  Sport and exercise psychologist Information:     Housing/Transportation Living arrangements for the past 2 months:  [place] (in and out of hospital with prolonged stays since May) Source of Information:  Patient, Scientist, water quality, Other (Comment Required), Adult Children Patient Interpreter Needed:  None Criminal Activity/Legal Involvement Pertinent to Current Situation/Hospitalization:  No - Comment as needed Significant Relationships:  Adult Children, Other Family Members Lives with:  Self, Facility Resident Do you feel safe going back to the place where you live?  Yes Need for family participation in patient care:  Yes (Comment) (family requesting to be involved)  Care giving concerns:  LCSW met with patient, son and daughter in law in the ED at [facility]. Patient reports the last three months have been so difficult and draining on her emotionally and physically as she has been in and out of the hospital.  Family reports recent placement at [facility] and facility is acceptable for patient to return when medically discharged.  Patient reports there are good people and bad people and it has been an adjustment to be in a new facility rather than at home. Family reports patient is total care at this time with ongoing supervision. Reports her husband is also in and out of the hospital.  2 daughters and other family involved during hospital stay with son and daughter in law  living in [place].  No concerns at this time other than making patient comfortable and addressing wound care and bleed.   Social Worker assessment / plan:  Patient is 74 year old female admitted at [facility] going to [facility].  Patient has been in and out of hospital with prolonged stay at [facility] and a recent SNF placement. Patient presents to ED at [facility] today due to continued wound care and continued rectal bleeding. Family reports they are accepting of SW interventions and assistance with disposition. Agreeable to return to [facility] which was chosen by family in last admission. LCSW completed assessment and discussed role again with family. Updated FL2 for SNF as patient admitted from SNF and plans to return Discussed with MD the possibility of a [place] meeting with family in effort to have a treatment plan as patient is chronically ill, define a decision maker, and code status in effort to make everyone on the same page of care and patient as comfortable as possible. LCSW will hand case off to unit CSW in effort to follow and assist with other disposition needs.  Employment status:  Retired Nurse, adult PT Recommendations:  [place] (from  most recent note) Information / Referral to community resources:  Other (Comment Required) (pallative care consult for [place])  Patient/Family's Response to care:  Agreeable with plan  Patient/Family's Understanding of and Emotional Response to Diagnosis, Current Treatment, and Prognosis:  Patient and family very realistic of prognosis  and discussed SW interventions such as Chiropodist. Family involved with decision making and support of patient. Patient tearful and adjusting to chronic illness along with new placement.  Hopeful for limited time in hospital and return to SNF.  Emotional Assessment Appearance:  Appears stated age Attitude/Demeanor/Rapport:  Lethargic, Unable to Assess (tearful and tired) Affect  (typically observed):  Tearful/Crying, Overwhelmed Orientation:  Oriented to Self, Oriented to Place, Oriented to  Time, Oriented to Situation Alcohol / Substance use:  Not Applicable Psych involvement (Current and /or in the community):  No (Comment)  Discharge Needs  Concerns to be addressed:  Adjustment to Illness Readmission within the last 30 days:  Yes Current discharge risk:  Chronically ill Barriers to Discharge:  Continued Medical Work up   [place], LCSW 09/29/2014, 11:21 AM

## 2014-09-29 NOTE — ED Notes (Signed)
Pt came in EMS from [facility] for rectal bleeding.  Staff reports they originally thought that the blood was coming from her rectum but she also has a wound to her right upper leg that they believe could be the source.  Wound to right upper leg had new dressing applied.  No bleeding noted.  Pt has foley cath and ostomy bag upon arrival.

## 2014-09-29 NOTE — ED Notes (Signed)
Attempted to call report x3.  Floor reports that pt is no longer going to their unit.

## 2014-09-29 NOTE — Progress Notes (Signed)
MEDICATION RELATED NOTE    Pharmacy Re:  Prednisone  Assessment: Patient is on steroid taper (see home meds).  What has been resumed at this admission is her dose today but continuously.  Dr. Algis Liming stated in his note with Imogene Burn was to continue her PTA dose of steroids.  She has been prescribed the following: 8/10 - 8/12 Prednisone 6mg  daily with breakfast x3 8/13 - 8/15 Prednisone 4mg  daily with breakfast x3 8/16 - 8/18 Prednisone 2mg  daily with breakfast x3 8/19 - 8/21 Prednisone 1mg  daily with breakfast x3 8/22 - 8/24 Prednisone 0.5mg  daily with breakfast x3  Plan:  Will change her inpatient orders to reflect this with new regimen starting in the morning per Dr. Janelle Floor note. Please review and continue at discharge if you feel appropriate.  Rober Minion, PharmD., MS Clinical Pharmacist Pager:  [phone] Thank you for allowing pharmacy to be part of this patients care team. 09/29/2014,9:28 PM

## 2014-09-29 NOTE — H&P (Signed)
[facility] History and Physical                                                                                    [name], is a 74 y.o. female  MRN: [id number]   DOB - 26-Dec-1940  Admit Date - 09/29/2014  Outpatient Primary MD for the patient is Kandice Hams, MD  Referring Physician:  Dr. Oleta Mouse, EDP  Chief Complaint:   Chief Complaint  Patient presents with  . Rectal Bleeding     HPI  [name]  is a 74 y.o. 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  [name] has unfortunately had 2 prolonged hospital stays recently. On June 20th  she was hospitalized with diarrhea and found to have ulcerative colitis and a perirectal abscess.  She had a large GI bleed. She also developed bilateral DVTs. She was unable to take anticoagulation therefore an IVC filter was placed. 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 At the end of her 2 hospitalizations she was discharged to skilled nursing facility on 8/4.  She remains on Remicade, prednisone, and Augmentin.  This morning she was brought from skilled nursing facility due to rectal bleeding. History was primarily gathered from the EDP as the patient has anxiety and great difficulty staying on topic.  Reportedly there was no blood from her ostomy but rather red blood mixed with stool in her diaper this morning.  Her most recent hemoglobin prior to discharge was on 8/1 and was 8.7. Hemoglobin today is 10.3. The patient does not know how much bleeding she has had as she is fully dependent with ADLs. She states that she has not been eating because she does not like the food but she has been drinking fluids. She is primarily bedbound but does get up to chair occasionally. She reports "pain all over", but when further  questioned she indicates the pain is primarily in her chest and buttocks.  She reports feeling bad since Tuesday when she attempted physical therapy. Her pain became severe last night when she requested pain medicine and was told it was not time to receive it yet. She describes a significant amount of anxiety and loneliness.  Review of Systems   In addition to the HPI above,  No Fever-chills, No Headache, No changes with Vision or hearing, No problems swallowing food or Liquids, No dysuria,a Foley is in place prior to admission to allow for wound healing No  new skin rashes or bruises, No new weakness, tingling, numbness in any extremity, No recent weight gain or loss, A full 10 point Review of Systems was done, except as stated above, all other Review of Systems were negative.  Past Medical History  Past Medical History  Diagnosis Date  . CAD (coronary artery disease)     unspecified  . Cerebrovascular disease   . Hyperlipidemia, mixed   . HTN (hypertension)   . Ulcerative colitis   . Diverticulitis   . Colon polyps  2006  . Thyroid disease   . Myocardial infarct   . Carotid artery occlusion 2010  . Diabetes mellitus without complication     Borderline  . GERD (gastroesophageal reflux disease)   . Arthritis     Past Surgical History  Procedure Laterality Date  . Cholecystectomy  7.06.2008    with cholangiogram  . 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 Abdominal hysterectomy    . Colonoscopy  multiple  . Sigmoidoscopy  multiple  . Flexible sigmoidoscopy N/A 08/08/2014    Procedure: FLEXIBLE SIGMOIDOSCOPY;  Surgeon: Gatha Mayer, MD;  Location: [facility];  Service: Endoscopy;  Laterality: N/A;  . Incision and drainage perirectal abscess N/A 08/22/2014    Procedure: IRRIGATION AND DEBRIDEMENT PERIRECTAL ABSCESS;  Surgeon: Jackolyn Confer, MD;  Location: [facility];  Service: General;  Laterality: N/A;  . Joint replacement  left knee  . Flexible sigmoidoscopy N/A  09/06/2014    Procedure: FLEXIBLE SIGMOIDOSCOPY;  Surgeon: Lafayette Dragon, MD;  Location: [facility];  Service: Endoscopy;  Laterality: N/A;  . Laparoscopic diverted colostomy N/A 09/13/2014    Procedure: LAPAROSCOPIC DIVERTED ILEOSTOMY;  Surgeon: Stark Klein, MD;  Location: [facility];  Service: General;  Laterality: N/A;  . Rectal exam under anesthesia N/A 09/13/2014    Procedure: RECTAL EXAM UNDER ANESTHESIA  AND DEBRIDEMENT OF PERIANAL WOUND;  Surgeon: Stark Klein, MD;  Location: [facility];  Service: General;  Laterality: N/A;      Social History Social History  Substance Use Topics  . Smoking status: Former Smoker -- 0.30 packs/day for 40 years    Types: Cigarettes    Quit date: 02/17/2013  . Smokeless tobacco: Never Used  . Alcohol Use: No   Dependent with ADLs. Gets up to chair for brief periods. From SNF. Was at home with husband and daughter prior to June 20   Family History Family History  Problem Relation Age of Onset  . Colitis Brother     undefined  . Stomach cancer Mother   . Stroke Father     died  . Colon cancer Neg Hx     Prior to Admission medications   Medication Sig Start Date End Date Taking? Authorizing Provider  acetaminophen (TYLENOL) 500 MG tablet Take 2 tablets (1,000 mg total) by mouth every 4 (four) hours as needed for mild pain or moderate pain. 08/28/14   Robbie Lis, MD  ALPRAZolam Duanne Moron) 0.5 MG tablet Take 1 tablet (0.5 mg total) by mouth 2 (two) times daily as needed for anxiety. 09/21/14   Silver Huguenin Elgergawy, MD  amLODipine (NORVASC) 10 MG tablet Take 1 tablet (10 mg total) by mouth daily. 09/21/14   Albertine Patricia, MD  amoxicillin-clavulanate (AUGMENTIN) 875-125 MG per tablet Take 1 tablet by mouth 2 (two) times daily. Patient will need to continue with oral Augmentin until she is seen in GI in 2 weeks, so continue for 3 weeks. 09/21/14   Silver Huguenin Elgergawy, MD  CRESTOR 40 MG tablet TAKE 1 TABLET AT BEDTIME Patient taking differently: TAKE 40 MG BY  MOUTH AT BEDTIME 05/17/14   Lelon Perla, MD  feeding supplement (BOOST / RESOURCE BREEZE) LIQD Take 1 Container by mouth 3 (three) times daily between meals. 09/21/14   Silver Huguenin Elgergawy, MD  fluconazole (DIFLUCAN) 200 MG tablet Take 2 tablets (400 mg total) by mouth daily. Patient will need to continue with oral Augmentin until she is seen in GI in 2 weeks, so continue for 3 weeks. 09/21/14  Albertine Patricia, MD  mesalamine (LIALDA) 1.2 G EC tablet take 4 tablets by mouth once daily Patient taking differently: Take 4.8 g by mouth daily.  07/31/14   Inda Castle, MD  metoprolol tartrate (LOPRESSOR) 25 MG tablet take 1 tablet by mouth twice a day Patient taking differently: TAKE 25 MG BY MOUTH TWICE DAILY 05/18/14   Lelon Perla, MD  oxyCODONE (OXY IR/ROXICODONE) 5 MG immediate release tablet Take 1 tablet (5 mg total) by mouth every 4 (four) hours as needed for moderate pain, severe pain or breakthrough pain. 08/28/14   Robbie Lis, MD  predniSONE (DELTASONE) 1 MG tablet Take 4 tablets (4 mg total) by mouth daily with breakfast. For 3 doses 09/30/14   Albertine Patricia, MD  predniSONE (DELTASONE) 1 MG tablet Take 2 tablets (2 mg total) by mouth daily with breakfast. For 3 doses 10/03/14   Albertine Patricia, MD  predniSONE (DELTASONE) 1 MG tablet Take 6 tablets (6 mg total) by mouth daily with breakfast. For 3 doses 09/27/14   Albertine Patricia, MD  predniSONE (DELTASONE) 1 MG tablet Take 1 tablet (1 mg total) by mouth daily with breakfast. For 3 doses 10/06/14   Albertine Patricia, MD  predniSONE (DELTASONE) 1 MG tablet Take 0.5 tablets (0.5 mg total) by mouth daily with breakfast. For 3 doses 10/09/14   Albertine Patricia, MD  predniSONE (DELTASONE) 10 MG tablet Take 1 tablet (10 mg total) by mouth daily with breakfast. For 3 doses 09/21/14   Silver Huguenin Elgergawy, MD  psyllium (HYDROCIL/METAMUCIL) 95 % PACK Take 1 packet by mouth 2 (two) times daily. 08/28/14   Robbie Lis, MD  saccharomyces  boulardii (FLORASTOR) 250 MG capsule Take 1 capsule (250 mg total) by mouth 2 (two) times daily. 08/03/14   Amy S [name], PA-C  sertraline (ZOLOFT) 50 MG tablet Take 1 tablet (50 mg total) by mouth daily. 09/27/14   Lauree Chandler, NP    Allergies  Allergen Reactions  . Clindamycin/Lincomycin Diarrhea    Leads to colitis flare  . Ivp Dye [Iodinated Diagnostic Agents] Other (See Comments)    "almost passed out"    Physical Exam  Vitals  Blood pressure 119/71, pulse 97, temperature 98 F (36.7 C), temperature source Oral, resp. rate 18, height 5\' 9"  (1.753 m), weight 93.441 kg (206 lb), SpO2 99 %.   General: elderly female  lying in bed complaining of pain and discomfort. Tearful   Psych: Awake Alert, Oriented X 3. Seems generally overwhelmed and anxious   Neuro:   No F.N deficits, ALL C.Nerves Intact,   ENT:  Ears and Eyes appear Normal, Conjunctivae clear, PER. Moist oral mucosa without erythema or exudates.  Neck:  Supple, No lymphadenopathy appreciated  Respiratory:  Symmetrical chest wall movement, Good air movement bilaterally, CTAB.  Cardiac:  RRR, No Murmurs, no LE edema noted, no JVD.    Abdomen:  Positive bowel sounds, Soft, Non tender, Non distended,  No masses appreciated, ileostomy in place no sign of infection.   Skin:  No Cyanosis, Normal Skin Turgor, No Skin Rash or Bruise.  her sacral area has multiple small skin lesions. There is an open wound next to her anus.  No current drainage or blood   Extremities:  Able to move all 4. 5/5 strength in each,  no effusions.  Data Review  CBC  Recent Labs Lab 09/29/14 0754  WBC 11.0*  HGB 10.3*  HCT 32.8*  PLT 197  MCV 96.2  MCH 30.2  MCHC 31.4  RDW 17.2*    Chemistries   Recent Labs Lab 09/29/14 0754  NA 132*  K 3.6  CL 99*  CO2 20*  GLUCOSE 86  BUN 7  CREATININE 0.59  CALCIUM 8.8*  AST 36  ALT 33  ALKPHOS 105  BILITOT 0.6     Coagulation profile  Recent Labs Lab 09/29/14 0754   INR 1.09     Urinalysis -pending   Imaging results:     My personal review of EKG: NSR, No ST changes noted.   Assessment & Plan  Principal Problem:   Rectal bleeding Active Problems:   Essential hypertension   Hyponatremia   General weakness   Abscess, perirectal s/p I&D 08/23/2014   Ulcerative colitis   Rectal bleeding  Possibly secondary to ulcerative colitis. Yellow liquid in ostomy bag  [facility] consulted. Patient denies significant abdominal pain. She is due for Remicade infusion 9/2. Will continue prior to admission Augmentin and low-dose prednisone for now.  Have discussed with GI PA.  We will defer imaging and potential surgical consultation to their discretion  CBC every 8 hours 3. No need for transfusion at this point. Nothing by mouth except for sips with meds. Low-dose narcotic pain medication when necessary  Lactic acidosis (2.87) Vitals stable.  Likely due to hypovolemia. Patient received a 1 L bolus in the emergency department No recent echo.  Will continue gentle IV fluids and recheck lactic acid  Hypertension Currently BP stable. We'll continue amlodipine and metoprolol  Ongoing Generalized weakness Likely secondary to illness and deconditioning. PT/OT consultation requested Urinalysis pending  Chest pain Likely anxiety related. Troponin 1 pending, EKG within normal limits, pain not reproducible on exam  Anxiety Will continue Xanax PRN and daily Zoloft.  Mild hyponatremia Likely secondary to poor by mouth intake Will give gentle IV fluids.  DVT Prophylaxis:  No SCDs due to recent DVTs, patient has IVC filter in place.  No pharmacologic prophylaxis due to GI bleeding.   Consultants Called:  [facility] consulted.  Family Communication:   Spoke with daughter Donnald Garre  Code Status:  Full. Confirmed with patient at bedside this morning  Condition:  Guarded  Potential Disposition: Return to SNF when workup complete and patient is  stable  Time spent in minutes : [address]  PA-C on 09/29/2014 at 10:00 AM Between 7am to 7pm - Pager - [phone] After 7pm go to [email or web address] - password [email or web address] And look for the night coverage person covering me after hours  [facility]

## 2014-09-30 DIAGNOSIS — E440 Moderate protein-calorie malnutrition: Secondary | ICD-10-CM | POA: Insufficient documentation

## 2014-09-30 DIAGNOSIS — K611 Rectal abscess: Secondary | ICD-10-CM

## 2014-09-30 DIAGNOSIS — I10 Essential (primary) hypertension: Secondary | ICD-10-CM

## 2014-09-30 DIAGNOSIS — E871 Hypo-osmolality and hyponatremia: Secondary | ICD-10-CM

## 2014-09-30 LAB — GLUCOSE, CAPILLARY
GLUCOSE-CAPILLARY: 135 mg/dL — AB (ref 65–99)
Glucose-Capillary: 127 mg/dL — ABNORMAL HIGH (ref 65–99)
Glucose-Capillary: 138 mg/dL — ABNORMAL HIGH (ref 65–99)
Glucose-Capillary: 147 mg/dL — ABNORMAL HIGH (ref 65–99)
Glucose-Capillary: 92 mg/dL (ref 65–99)

## 2014-09-30 LAB — HEMOGLOBIN A1C
HEMOGLOBIN A1C: 5.6 % (ref 4.8–5.6)
Mean Plasma Glucose: 114 mg/dL

## 2014-09-30 LAB — BASIC METABOLIC PANEL
ANION GAP: 7 (ref 5–15)
BUN: 5 mg/dL — ABNORMAL LOW (ref 6–20)
CO2: 19 mmol/L — ABNORMAL LOW (ref 22–32)
CREATININE: 0.51 mg/dL (ref 0.44–1.00)
Calcium: 8.1 mg/dL — ABNORMAL LOW (ref 8.9–10.3)
Chloride: 105 mmol/L (ref 101–111)
GFR calc Af Amer: >60 mL/min (ref >60)
GFR calc non Af Amer: >60 mL/min (ref >60)
Glucose, Bld: 137 mg/dL — ABNORMAL HIGH (ref 65–99)
Potassium: 4.5 mmol/L (ref 3.5–5.1)
Sodium: 131 mmol/L — ABNORMAL LOW (ref 135–145)

## 2014-09-30 LAB — CBC
HCT: 29.3 % — ABNORMAL LOW (ref 36.0–46.0)
Hemoglobin: 9.4 g/dL — ABNORMAL LOW (ref 12.0–15.0)
MCH: 30.1 pg (ref 26.0–34.0)
MCHC: 32.1 g/dL (ref 30.0–36.0)
MCV: 93.9 fL (ref 78.0–100.0)
Platelets: 247 10*3/uL (ref 150–400)
RBC: 3.12 MIL/uL — AB (ref 3.87–5.11)
RDW: 17.2 % — ABNORMAL HIGH (ref 11.5–15.5)
WBC: 6.8 10*3/uL (ref 4.0–10.5)

## 2014-09-30 MED ORDER — TAB-A-VITE/IRON PO TABS
1.0000 | ORAL_TABLET | Freq: Every day | ORAL | Status: DC
  Administered 2014-09-30 – 2014-10-07 (×8): 1 via ORAL
  Filled 2014-09-30 (×13): qty 1

## 2014-09-30 MED ORDER — BOOST / RESOURCE BREEZE PO LIQD
1.0000 | Freq: Three times a day (TID) | ORAL | Status: DC
  Administered 2014-09-30 – 2014-10-02 (×6): 1 via ORAL

## 2014-09-30 NOTE — Progress Notes (Signed)
[facility] PROGRESS NOTE  [name] BTD:[id number] DOB: 1940/06/08 DOA: 09/29/2014 PCP: [name] Hams, MD  Assessment/Plan: 1. Rectal bleeding- resolved, CT abdomen pelvis showed ostomy in the right lower quadrant no evidence of bowel obstruction or abscess noted. H&H has been stable. Likely patient had bleeding from large right buttock perianal wound. GI following. 2. Perianal wound- status post incision and drainage, packed with gauze. Continue Augmentin 3. Lactic acidosis- resolved, repeat lactic acid 1.34 4. Hypertension- BP stable, continue amlodipine and metoprolol 5. Chest pain- resolved, troponin 1 was negative yesterday. 6. Anxiety- continue Xanax when necessary and Zoloft 7. UTI- urine culture growing gram-negative rods. Continue Augmentin. Follow the urine culture results 8. Hyponatremia- mild, today sodium 131. Likely from dehydration. Follow BMP in a.m. 9. 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 [name] has unfortunately had 2 prolonged hospital stays recently. On June 20th she was hospitalized with diarrhea and found to have ulcerative colitis and a perirectal abscess. She had a large GI bleed. She also developed bilateral DVTs. She was unable to take anticoagulation therefore an IVC filter was placed. 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 At the end of her 2 hospitalizations she was discharged to skilled nursing facility on 8/4. She remains on Remicade, prednisone, and Augmentin. Patient  denies any bleeding this morning. H&H has remained stable  Objective: Filed Vitals:   09/30/14 0759  BP: 123/64  Pulse: 88  Temp: 98.2 F (36.8 C)  Resp: 18    Intake/Output Summary (Last 24 hours) at 09/30/14 1451 Last data filed at 09/30/14 1443  Gross per 24 hour  Intake   1835 ml  Output   1800 ml  Net     35 ml   Filed Weights   09/29/14 0717  Weight: 93.441 kg (206 lb)    Exam:   General:  Appears in no acute distress  Cardiovascular: S1-S2 regular  Respiratory: Clear to auscultation bilaterally  Abdomen: Soft, nontender  Musculoskeletal: No edema of the lower extremities  Data Reviewed: Basic Metabolic Panel:  Recent Labs Lab 09/29/14 0754 09/30/14 0427  NA 132* 131*  K 3.6 4.5  CL 99* 105  CO2 20* 19*  GLUCOSE 86 137*  BUN 7 5*  CREATININE 0.59 0.51  CALCIUM 8.8* 8.1*   Liver Function Tests:  Recent Labs Lab 09/29/14 0754  AST 36  ALT 33  ALKPHOS 105  BILITOT 0.6  PROT 6.2*  ALBUMIN 2.5*     Recent Labs Lab 09/29/14 0754 09/30/14 0427  WBC 11.0* 6.8  HGB 10.3* 9.4*  HCT 32.8* 29.3*  MCV 96.2 93.9  PLT 197 247   Cardiac Enzymes:  Recent Labs Lab 09/29/14 1035  TROPONINI <0.03    CBG:  Recent Labs Lab 09/29/14 1712 09/29/14 2021 09/30/14 0010 09/30/14 0758 09/30/14 1202  GLUCAP 84 114* 147* 127* 138*    Recent Results (from the past 240 hour(s))  Urine culture     Status: None (Preliminary result)   Collection Time: 09/29/14  1:30 PM  Result Value Ref Range Status   Specimen Description URINE, RANDOM  Final  Special Requests NONE  Final   Culture 10,000 COLONIES/mL GRAM NEGATIVE RODS  Final   Report Status PENDING  Incomplete  MRSA PCR Screening     Status: None   Collection Time: 09/29/14  4:16 PM  Result Value Ref Range Status   MRSA by PCR NEGATIVE NEGATIVE Final    Comment:        The GeneXpert MRSA Assay (FDA approved for NASAL specimens only), is one component of a comprehensive MRSA  colonization surveillance program. It is not intended to diagnose MRSA infection nor to guide or monitor treatment for MRSA infections.      Studies: Ct Abdomen Pelvis W Contrast  09/30/2014   CLINICAL DATA:  Rectal bleeding and history of recent rectal abscess.  EXAM: CT ABDOMEN AND PELVIS WITH CONTRAST  TECHNIQUE: Multidetector CT imaging of the abdomen and pelvis was performed using the standard protocol following bolus administration of intravenous contrast.  CONTRAST:  140mL OMNIPAQUE IOHEXOL 300 MG/ML  SOLN  COMPARISON:  CT scan of August 22, 2014.  FINDINGS: Visualized lung bases are unremarkable. No significant osseous abnormality is noted.  Status post cholecystectomy. Probable focal fatty infiltration is seen involving the inferior and anterior portion of the right hepatic lobe. The spleen and pancreas appear normal. Atherosclerosis of abdominal aorta is noted without aneurysm formation. IVC filter is seen in infrarenal position. Adrenal glands appear normal. No hydronephrosis or renal obstruction is noted. Simple left renal cyst is noted. Bilateral cortical scarring is seen involving the kidneys. The appendix appears normal. Ostomy is seen in the right lower quadrant. No evidence of bowel obstruction is noted. No abnormal fluid collection is noted. Urinary bladder is decompressed secondary to Foley catheter. Status post hysterectomy. Ovaries appear normal. No significant adenopathy is noted.  IMPRESSION: Atherosclerosis of abdominal aorta without aneurysm formation.  Ostomy is seen in right lower quadrant. No evidence of bowel obstruction or abscess is noted.  No acute abnormality is noted in the abdomen or pelvis.   Electronically Signed   By: Marijo Conception, M.D.   On: 09/30/2014 09:00    Scheduled Meds: . amLODipine  10 mg Oral Daily  . amoxicillin-clavulanate  1 tablet Oral BID  . feeding supplement  1 Container Oral TID BM  . fluconazole  400 mg Oral Daily  . insulin aspart  0-9  Units Subcutaneous 6 times per day  . mesalamine  4.8 g Oral Daily  . metoprolol tartrate  25 mg Oral BID  . multivitamins with iron  1 tablet Oral Daily  . predniSONE  4 mg Oral Q breakfast   Followed by  . [START ON 10/03/2014] predniSONE  2 mg Oral Q breakfast   Followed by  . [START ON 10/06/2014] predniSONE  1 mg Oral Q breakfast   Followed by  . [START ON 10/09/2014] predniSONE  0.5 mg Oral Q breakfast  . psyllium  1 packet Oral BID  . rosuvastatin  40 mg Oral QHS  . saccharomyces boulardii  250 mg Oral BID  . sertraline  25 mg Oral Daily  . [START ON 10/05/2014] sertraline  50 mg Oral Daily  . sodium chloride  3 mL Intravenous Q12H   Continuous Infusions: . 0.9 % NaCl with KCl 20 mEq / L 125 mL/hr at 09/29/14 2233    Principal Problem:   Rectal bleeding Active Problems:   Essential hypertension   Hyponatremia   General weakness   Abscess, perirectal s/p I&D 08/23/2014   Ulcerative colitis   Elevated  lactic acid level   Rectal fistula   GI bleed    Time spent: 25 min    [facility]  [facility] Pager [phone]*. If 7PM-7AM, please contact night-coverage at [email or web address], password [facility] 09/30/2014, 2:51 PM  LOS: 1 day

## 2014-09-30 NOTE — Evaluation (Signed)
Physical Therapy Evaluation Patient Details Name: [name] MRN: [id number] DOB: Dec 12, 1940 Today's Date: 09/30/2014   History of Present Illness  Pt. admitted 09/29/14 from [facility] due to rectal bleeding.  Blood thought to be from rectl bleed vs. sacrl wound.  Ostomy consult reveals pt. has several partial thickness sacral wounds.  Pt also has diverting ileostomy.  PMH inculdes ulcerative colitis, CVA, (B) DVTs, perirectal abscess.   Clinical Impression  Pt. Presents to PT with above issues and is quite debilitated and with generalized weakness, limitations in bed mobility, transfers and gait.  She will benefit from acute PT to address these issues.  Pt. Will likely need SNF level care and therapies following her acute stay.    Follow Up Recommendations SNF;Supervision/Assistance - 24 hour    Equipment Recommendations  None recommended by PT    Recommendations for Other Services       Precautions / Restrictions Precautions Precautions: Fall;Other (comment) (pressure injuries) Restrictions Weight Bearing Restrictions: No      Mobility  Bed Mobility Overal bed mobility: Needs Assistance Bed Mobility: Rolling Rolling: Mod assist         General bed mobility comments: Pt. able to initiate rolling with use of bed rail, but needs mod assist to fully rotate hips  Transfers Overall transfer level:  (pt. too weak today to attempt)   Transfers:  (not attempted due to weakness)              Ambulation/Gait                Stairs            Wheelchair Mobility    Modified Rankin (Stroke Patients Only)       Balance                                             Pertinent Vitals/Pain Pain Assessment: Faces Faces Pain Scale: Hurts even more Pain Location: sacral area with attempts to move in bed Pain Descriptors / Indicators: Sore Pain Intervention(s): Limited activity within patient's tolerance;Monitored during  session;Repositioned    Home Living Family/patient expects to be discharged to:: Unsure Living Arrangements: Other (Comment) (pt. came from SNF)             Home Equipment: Walker - 2 wheels      Prior Function Level of Independence: Needs assistance   Gait / Transfers Assistance Needed: Daughter states it has been a couple of months since patient able to walk.  Staff at [facility] reportedly have been using lift equipent to get pt. to a reclining type chair.  Daughte says pt. does not currently have a cushion when she sits up           Hand Dominance        Extremity/Trunk Assessment   Upper Extremity Assessment: Generalized weakness           Lower Extremity Assessment: Generalized weakness         Communication   Communication: No difficulties  Cognition Arousal/Alertness: Lethargic Behavior During Therapy: WFL for tasks assessed/performed Overall Cognitive Status: History of cognitive impairments - at baseline ([name] states pt. has memory issues)       Memory: Decreased short-term memory;Decreased recall of precautions              General Comments General comments (skin integrity, edema,  etc.): Note muscle atrophy in lower legs especially anterior compartment    Exercises General Exercises - Lower Extremity Ankle Circles/Pumps: AROM;Both;5 reps;Supine Short Arc Quad: AAROM;AROM;Both;10 reps;Supine Heel Slides: AAROM;Both;10 reps;Supine Hip ABduction/ADduction: AAROM;Both;5 reps;Supine      Assessment/Plan    PT Assessment Patient needs continued PT services  PT Diagnosis Generalized weakness;Difficulty walking;Acute pain   PT Problem List Decreased strength;Decreased activity tolerance;Decreased mobility;Decreased cognition;Pain  PT Treatment Interventions DME instruction;Gait training;Functional mobility training;Therapeutic activities;Therapeutic exercise;Patient/family education   PT Goals (Current goals can be found in the  Care Plan section) Acute Rehab PT Goals Patient Stated Goal: pt. able to state that she does want to walk again PT Goal Formulation: With patient Time For Goal Achievement: 10/14/14 Potential to Achieve Goals: Fair    Frequency Min 3X/week   Barriers to discharge        Co-evaluation               End of Session   Activity Tolerance: Patient limited by fatigue;Other (comment) (limited by generalized weakness) Patient left: in bed;with call bell/phone within reach;with family/visitor present ([name]) Nurse Communication: Mobility status         Time: 9643-8381 PT Time Calculation (min) (ACUTE ONLY): 19 min   Charges:   PT Evaluation $Initial PT Evaluation Tier I: 1 Procedure     PT G CodesLadona Ridgel 09/30/2014, 2:18 PM Gerlean Ren PT Acute Rehab Services [place] [phone]

## 2014-09-30 NOTE — Progress Notes (Addendum)
Initial Nutrition Assessment  DOCUMENTATION CODES:   Obesity unspecified, Non-severe (moderate) malnutrition in context of acute illness/injury  INTERVENTION:   -Boost Breeze po TID, each supplement provides 250 kcal and 9 grams of protein -MVI daily  NUTRITION DIAGNOSIS:   Inadequate oral intake related to altered GI function as evidenced by per patient/family report.  GOAL:   Patient will meet greater than or equal to 90% of their needs  MONITOR:   PO intake, Supplement acceptance, Diet advancement, Labs, Weight trends, Skin, I & O's  REASON FOR ASSESSMENT:   Malnutrition Screening Tool    ASSESSMENT:   [name] is a 73 y.o. 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 [name] has unfortunately had 2 prolonged hospital stays recently. On June 20th she was hospitalized with diarrhea and found to have ulcerative colitis and a perirectal abscess. She had a large GI bleed. She also developed bilateral DVTs. She was unable to take anticoagulation therefore an IVC filter was placed. 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 At the end of her 2 hospitalizations she was discharged to skilled nursing facility on 8/4. She remains on Remicade, prednisone, and Augmentin.  Pt admitted with rectal bleed. She was admitted from [facility] after a long hospitalization at [facility].   Pt was drowsy at time of visit, however, was able to respond appropriately to close-ended questions. She reports a general decline in health over the past 2 weeks, citing a 20# wt loss and a poor appetite. Per wt hx, pt was experienced a 5% wt loss over the past week.   Reviewed [facility] note on 09/29/14. Pt with infectious chronic lt leg wound. Per RN wound assessment, pt with  dehisced rt rectal wound, stage I pressure ulcer to lt buttocks, stage II pressure ulcer to buttocks, and a perineal/rectal incision. Pt on specialized air mattress.  Pt also with RLQ ileostomy- noted 550 ml output within the past 24 hours.   Pt NPO at time of visit, but noted diet advancement to clear liquid diet upon documentation. Will add Boost Breeze and MVI to support nutritional adequacy.   Nutrition-Focused physical exam completed. Findings are mild to moderate fat depletion, mild to moderate muscle depletion, and no edema.   Labs reviewed: NA: 131 (on IV supplementation).   Diet Order:  Diet clear liquid Room service appropriate?: Yes; Fluid consistency:: Thin  Skin:  Wound (see comment) (dehisced rt rectal wound, st II buttocks, skin tear lt butto)  Last BM:  09/29/14  Height:   Ht Readings from Last 1 Encounters:  09/29/14 5\' 9"  (1.753 m)    Weight:   Wt Readings from Last 1 Encounters:  09/29/14 206 lb (93.441 kg)    Ideal Body Weight:  72.7 kg  BMI:  Body mass index is 30.41 kg/(m^2).  Estimated Nutritional Needs:   Kcal:  2100-2300  Protein:  105-120 grams  Fluid:  >2.0 L  EDUCATION NEEDS:   No education needs identified at this time  Debralee Braaksma A. Jimmye Norman, RD, LDN, CDE Pager: [phone] After hours Pager: [phone]

## 2014-09-30 NOTE — Progress Notes (Signed)
Subjective: No complaints.  No further abdominal pain.  The pain resolved yesterday afternoon.  Objective: Vital signs in last 24 hours: Temp:  [98.1 F (36.7 C)-99.1 F (37.3 C)] 98.2 F (36.8 C) (08/13 0759) Pulse Rate:  [73-88] 88 (08/13 0759) Resp:  [16-18] 18 (08/13 0759) BP: (101-123)/(55-65) 123/64 mmHg (08/13 0759) SpO2:  [90 %-100 %] 100 % (08/13 0759) Last BM Date: 09/29/14  Intake/Output from previous day: 08/12 0701 - 08/13 0700 In: 4854 [P.O.:120; I.V.:1475] Out: 1600 [Urine:1050; Stool:550] Intake/Output this shift:    General appearance: alert and no distress GI: soft, non-tender; bowel sounds normal; no masses,  no organomegaly  Lab Results:  Recent Labs  09/29/14 0754 09/30/14 0427  WBC 11.0* 6.8  HGB 10.3* 9.4*  HCT 32.8* 29.3*  PLT 197 247   BMET  Recent Labs  09/29/14 0754 09/30/14 0427  NA 132* 131*  K 3.6 4.5  CL 99* 105  CO2 20* 19*  GLUCOSE 86 137*  BUN 7 5*  CREATININE 0.59 0.51  CALCIUM 8.8* 8.1*   LFT  Recent Labs  09/29/14 0754  PROT 6.2*  ALBUMIN 2.5*  AST 36  ALT 33  ALKPHOS 105  BILITOT 0.6   PT/INR  Recent Labs  09/29/14 0754  LABPROT 14.3  INR 1.09   Hepatitis Panel No results for input(s): HEPBSAG, HCVAB, HEPAIGM, HEPBIGM in the last 72 hours. C-Diff No results for input(s): CDIFFTOX in the last 72 hours. Fecal Lactopherrin No results for input(s): FECLLACTOFRN in the last 72 hours.  Studies/Results: Ct Abdomen Pelvis W Contrast  09/30/2014   CLINICAL DATA:  Rectal bleeding and history of recent rectal abscess.  EXAM: CT ABDOMEN AND PELVIS WITH CONTRAST  TECHNIQUE: Multidetector CT imaging of the abdomen and pelvis was performed using the standard protocol following bolus administration of intravenous contrast.  CONTRAST:  117mL OMNIPAQUE IOHEXOL 300 MG/ML  SOLN  COMPARISON:  CT scan of August 22, 2014.  FINDINGS: Visualized lung bases are unremarkable. No significant osseous abnormality is noted.  Status  post cholecystectomy. Probable focal fatty infiltration is seen involving the inferior and anterior portion of the right hepatic lobe. The spleen and pancreas appear normal. Atherosclerosis of abdominal aorta is noted without aneurysm formation. IVC filter is seen in infrarenal position. Adrenal glands appear normal. No hydronephrosis or renal obstruction is noted. Simple left renal cyst is noted. Bilateral cortical scarring is seen involving the kidneys. The appendix appears normal. Ostomy is seen in the right lower quadrant. No evidence of bowel obstruction is noted. No abnormal fluid collection is noted. Urinary bladder is decompressed secondary to Foley catheter. Status post hysterectomy. Ovaries appear normal. No significant adenopathy is noted.  IMPRESSION: Atherosclerosis of abdominal aorta without aneurysm formation.  Ostomy is seen in right lower quadrant. No evidence of bowel obstruction or abscess is noted.  No acute abnormality is noted in the abdomen or pelvis.   Electronically Signed   By: Marijo Conception, M.D.   On: 09/30/2014 09:00    Medications:  Scheduled: . amLODipine  10 mg Oral Daily  . amoxicillin-clavulanate  1 tablet Oral BID  . fluconazole  400 mg Oral Daily  . insulin aspart  0-9 Units Subcutaneous 6 times per day  . mesalamine  4.8 g Oral Daily  . metoprolol tartrate  25 mg Oral BID  . predniSONE  4 mg Oral Q breakfast   Followed by  . [START ON 10/03/2014] predniSONE  2 mg Oral Q breakfast   Followed by  . [  START ON 10/06/2014] predniSONE  1 mg Oral Q breakfast   Followed by  . [START ON 10/09/2014] predniSONE  0.5 mg Oral Q breakfast  . psyllium  1 packet Oral BID  . rosuvastatin  40 mg Oral QHS  . saccharomyces boulardii  250 mg Oral BID  . sertraline  25 mg Oral Daily  . [START ON 10/05/2014] sertraline  50 mg Oral Daily  . sodium chloride  3 mL Intravenous Q12H   Continuous: . 0.9 % NaCl with KCl 20 mEq / L 125 mL/hr at 09/29/14 2233    Assessment/Plan: 1)  Lower abdominal pain. 2) Left sided colitis.   The patient is much better at this time.  Her WBC has improved.  I do not know the source of the pain, but clinically she is well.  Since she has a significant history of recent GI problems, I think it will be prudent to follow her one more day.  If she does not have any pain tomorrow AM, she can be discharged home.  Plan: 1) Continue supportive care.  LOS: 1 day   Renton Berkley D 09/30/2014, 11:05 AM

## 2014-10-01 LAB — GLUCOSE, CAPILLARY
GLUCOSE-CAPILLARY: 102 mg/dL — AB (ref 65–99)
GLUCOSE-CAPILLARY: 125 mg/dL — AB (ref 65–99)
GLUCOSE-CAPILLARY: 143 mg/dL — AB (ref 65–99)
GLUCOSE-CAPILLARY: 92 mg/dL (ref 65–99)
GLUCOSE-CAPILLARY: 94 mg/dL (ref 65–99)
Glucose-Capillary: 102 mg/dL — ABNORMAL HIGH (ref 65–99)

## 2014-10-01 LAB — BASIC METABOLIC PANEL
Anion gap: 7 (ref 5–15)
BUN: 9 mg/dL (ref 6–20)
CO2: 20 mmol/L — ABNORMAL LOW (ref 22–32)
CREATININE: 0.62 mg/dL (ref 0.44–1.00)
Calcium: 8.4 mg/dL — ABNORMAL LOW (ref 8.9–10.3)
Chloride: 103 mmol/L (ref 101–111)
GFR calc non Af Amer: >60 mL/min (ref >60)
Glucose, Bld: 124 mg/dL — ABNORMAL HIGH (ref 65–99)
POTASSIUM: 4.8 mmol/L (ref 3.5–5.1)
SODIUM: 130 mmol/L — AB (ref 135–145)

## 2014-10-01 LAB — CBC
HEMATOCRIT: 29.2 % — AB (ref 36.0–46.0)
Hemoglobin: 9.3 g/dL — ABNORMAL LOW (ref 12.0–15.0)
MCH: 29.8 pg (ref 26.0–34.0)
MCHC: 31.8 g/dL (ref 30.0–36.0)
MCV: 93.6 fL (ref 78.0–100.0)
PLATELETS: 255 10*3/uL (ref 150–400)
RBC: 3.12 MIL/uL — ABNORMAL LOW (ref 3.87–5.11)
RDW: 17 % — AB (ref 11.5–15.5)
WBC: 13.3 10*3/uL — AB (ref 4.0–10.5)

## 2014-10-01 NOTE — Progress Notes (Signed)
Subjective: "I'm disgusted."  No further abdominal pain.  Objective: Vital signs in last 24 hours: Temp:  [98 F (36.7 C)-98.8 F (37.1 C)] 98 F (36.7 C) (08/14 0426) Pulse Rate:  [77-88] 78 (08/14 0426) Resp:  [18-20] 18 (08/14 0426) BP: (108-123)/(53-64) 108/55 mmHg (08/14 0426) SpO2:  [99 %-100 %] 99 % (08/14 0426) Last BM Date: 09/30/14  Intake/Output from previous day: 08/13 0701 - 08/14 0700 In: 2970 [P.O.:720; I.V.:2250] Out: 1650 [Urine:800; Stool:850] Intake/Output this shift:    General appearance: alert and no distress GI: soft, non-tender; bowel sounds normal; no masses,  no organomegaly  Lab Results:  Recent Labs  09/29/14 0754 09/30/14 0427  WBC 11.0* 6.8  HGB 10.3* 9.4*  HCT 32.8* 29.3*  PLT 197 247   BMET  Recent Labs  09/29/14 0754 09/30/14 0427  NA 132* 131*  K 3.6 4.5  CL 99* 105  CO2 20* 19*  GLUCOSE 86 137*  BUN 7 5*  CREATININE 0.59 0.51  CALCIUM 8.8* 8.1*   LFT  Recent Labs  09/29/14 0754  PROT 6.2*  ALBUMIN 2.5*  AST 36  ALT 33  ALKPHOS 105  BILITOT 0.6   PT/INR  Recent Labs  09/29/14 0754  LABPROT 14.3  INR 1.09   Hepatitis Panel No results for input(s): HEPBSAG, HCVAB, HEPAIGM, HEPBIGM in the last 72 hours. C-Diff No results for input(s): CDIFFTOX in the last 72 hours. Fecal Lactopherrin No results for input(s): FECLLACTOFRN in the last 72 hours.  Studies/Results: Ct Abdomen Pelvis W Contrast  09/30/2014   CLINICAL DATA:  Rectal bleeding and history of recent rectal abscess.  EXAM: CT ABDOMEN AND PELVIS WITH CONTRAST  TECHNIQUE: Multidetector CT imaging of the abdomen and pelvis was performed using the standard protocol following bolus administration of intravenous contrast.  CONTRAST:  167mL OMNIPAQUE IOHEXOL 300 MG/ML  SOLN  COMPARISON:  CT scan of August 22, 2014.  FINDINGS: Visualized lung bases are unremarkable. No significant osseous abnormality is noted.  Status post cholecystectomy. Probable focal fatty  infiltration is seen involving the inferior and anterior portion of the right hepatic lobe. The spleen and pancreas appear normal. Atherosclerosis of abdominal aorta is noted without aneurysm formation. IVC filter is seen in infrarenal position. Adrenal glands appear normal. No hydronephrosis or renal obstruction is noted. Simple left renal cyst is noted. Bilateral cortical scarring is seen involving the kidneys. The appendix appears normal. Ostomy is seen in the right lower quadrant. No evidence of bowel obstruction is noted. No abnormal fluid collection is noted. Urinary bladder is decompressed secondary to Foley catheter. Status post hysterectomy. Ovaries appear normal. No significant adenopathy is noted.  IMPRESSION: Atherosclerosis of abdominal aorta without aneurysm formation.  Ostomy is seen in right lower quadrant. No evidence of bowel obstruction or abscess is noted.  No acute abnormality is noted in the abdomen or pelvis.   Electronically Signed   By: Marijo Conception, M.D.   On: 09/30/2014 09:00    Medications:  Scheduled: . amLODipine  10 mg Oral Daily  . amoxicillin-clavulanate  1 tablet Oral BID  . feeding supplement  1 Container Oral TID BM  . fluconazole  400 mg Oral Daily  . insulin aspart  0-9 Units Subcutaneous 6 times per day  . mesalamine  4.8 g Oral Daily  . metoprolol tartrate  25 mg Oral BID  . multivitamins with iron  1 tablet Oral Daily  . predniSONE  4 mg Oral Q breakfast   Followed by  . [  START ON 10/03/2014] predniSONE  2 mg Oral Q breakfast   Followed by  . [START ON 10/06/2014] predniSONE  1 mg Oral Q breakfast   Followed by  . [START ON 10/09/2014] predniSONE  0.5 mg Oral Q breakfast  . psyllium  1 packet Oral BID  . rosuvastatin  40 mg Oral QHS  . saccharomyces boulardii  250 mg Oral BID  . sertraline  25 mg Oral Daily  . [START ON 10/05/2014] sertraline  50 mg Oral Daily  . sodium chloride  3 mL Intravenous Q12H   Continuous: . 0.9 % NaCl with KCl 20 mEq / L  125 mL/hr at 09/30/14 2106    Assessment/Plan: 1) Resolved lower abdominal pain. 2) Left sided colitis.   She has many complaints about her hospitalization, but she denies any further abdominal pain.  From the medical standpoint, she is stable and she can be discharged back to the Nursing Home safely.  Plan: 1) Okay to D/C back to Nursing Home. 2) Continue with current medical management. 3) Signing off. 4) Follow up with [facility] when appropriate.   LOS: 2 days   Lauren Modisette D 10/01/2014, 7:44 AM

## 2014-10-01 NOTE — Progress Notes (Signed)
Patient with bright, red, bloody stool/clots. Looked as if coming from rectum, not packed, abscessed area. Dr. Darrick Meigs texted. Family called.

## 2014-10-01 NOTE — Progress Notes (Signed)
Pt with bright red blood clots via rectum. Much smaller amount than before. Red blood in ostomy bag. MD aware. Will continue to monitor.

## 2014-10-01 NOTE — Progress Notes (Signed)
[facility] PROGRESS NOTE  [name] ZHY:[id number] DOB: [birth year] DOA: 09/29/2014 PCP: [name] Hams, MD  Assessment/Plan: 1. Rectal bleeding- Patient had bright red blood clots per rectum and also had red blood in ostomy bag,  I called and discussed with GI Dr Carol Ada, who recommends to observe for one day and follow H&H. CT abdomen pelvis showed ostomy in the right lower quadrant no evidence of bowel obstruction or abscess noted. Follow CBC. 2. Perianal wound- status post incision and drainage, packed with gauze. Continue Augmentin 3. Lactic acidosis- resolved, repeat lactic acid 1.34 4. Hypertension- BP stable, continue amlodipine and metoprolol 5. Chest pain- resolved, troponin 1 was negative yesterday. 6. Anxiety- continue Xanax when necessary and Zoloft 7. UTI- urine culture growing gram-negative rods. Continue Augmentin. Follow the urine culture results 8. Hyponatremia- mild, today sodium 131. Likely from dehydration. Follow BMP in a.m. 9. 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 [name] has unfortunately had 2 prolonged hospital stays recently. On June 20th she was hospitalized with diarrhea and found to have ulcerative colitis and a perirectal abscess. She had a large GI bleed. She also developed bilateral DVTs. She was unable to take anticoagulation therefore an IVC filter was placed. 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 At the end of her 2 hospitalizations she was discharged to skilled  nursing facility on 8/4. She remains on Remicade, prednisone, and Augmentin. Patient had episode of rectal bleed this morning  Objective: Filed Vitals:   10/01/14 0759  BP: 118/57  Pulse: 96  Temp: 98.4 F (36.9 C)  Resp: 18    Intake/Output Summary (Last 24 hours) at 10/01/14 1308 Last data filed at 10/01/14 1014  Gross per 24 hour  Intake   2955 ml  Output   1900 ml  Net   1055 ml   Filed Weights   09/29/14 0717  Weight: 93.441 kg (206 lb)    Exam:   General:  Appears in no acute distress  Cardiovascular: S1-S2 regular  Respiratory: Clear to auscultation bilaterally  Abdomen: Soft, nontender  Musculoskeletal: No edema of the lower extremities  Data Reviewed: Basic Metabolic Panel:  Recent Labs Lab 09/29/14 0754 09/30/14 0427  NA 132* 131*  K 3.6 4.5  CL 99* 105  CO2 20* 19*  GLUCOSE 86 137*  BUN 7 5*  CREATININE 0.59 0.51  CALCIUM 8.8* 8.1*   Liver Function Tests:  Recent Labs Lab 09/29/14 0754  AST 36  ALT 33  ALKPHOS 105  BILITOT 0.6  PROT 6.2*  ALBUMIN 2.5*     Recent Labs Lab 09/29/14 0754 09/30/14 0427  WBC 11.0* 6.8  HGB 10.3* 9.4*  HCT 32.8* 29.3*  MCV 96.2 93.9  PLT 197 247   Cardiac Enzymes:  Recent Labs Lab 09/29/14 1035  TROPONINI <0.03    CBG:  Recent Labs Lab 09/30/14 2021 10/01/14 0013 10/01/14 0426 10/01/14 0758 10/01/14 1124  GLUCAP 92 102* 94 92 143*    Recent Results (from the past 240 hour(s))  Urine culture     Status: None (Preliminary result)   Collection Time: 09/29/14  1:30 PM  Result Value Ref Range Status   Specimen Description URINE, RANDOM  Final   Special Requests NONE  Final   Culture 10,000 COLONIES/mL GRAM NEGATIVE RODS  Final   Report Status PENDING  Incomplete  MRSA PCR Screening     Status: None   Collection Time: 09/29/14  4:16 PM  Result Value Ref Range Status   MRSA by PCR NEGATIVE NEGATIVE Final    Comment:        The GeneXpert MRSA Assay (FDA approved for NASAL  specimens only), is one component of a comprehensive MRSA colonization surveillance program. It is not intended to diagnose MRSA infection nor to guide or monitor treatment for MRSA infections.      Studies: Ct Abdomen Pelvis W Contrast  09/30/2014   CLINICAL DATA:  Rectal bleeding and history of recent rectal abscess.  EXAM: CT ABDOMEN AND PELVIS WITH CONTRAST  TECHNIQUE: Multidetector CT imaging of the abdomen and pelvis was performed using the standard protocol following bolus administration of intravenous contrast.  CONTRAST:  153mL OMNIPAQUE IOHEXOL 300 MG/ML  SOLN  COMPARISON:  CT scan of August 22, 2014.  FINDINGS: Visualized lung bases are unremarkable. No significant osseous abnormality is noted.  Status post cholecystectomy. Probable focal fatty infiltration is seen involving the inferior and anterior portion of the right hepatic lobe. The spleen and pancreas appear normal. Atherosclerosis of abdominal aorta is noted without aneurysm formation. IVC filter is seen in infrarenal position. Adrenal glands appear normal. No hydronephrosis or renal obstruction is noted. Simple left renal cyst is noted. Bilateral cortical scarring is seen involving the kidneys. The appendix appears normal. Ostomy is seen in the right lower quadrant. No evidence of bowel obstruction is noted. No abnormal fluid collection is noted. Urinary bladder is decompressed secondary to Foley catheter. Status post hysterectomy. Ovaries appear normal. No significant adenopathy is noted.  IMPRESSION: Atherosclerosis of abdominal aorta without aneurysm formation.  Ostomy is seen in right lower quadrant. No evidence of bowel obstruction or abscess is noted.  No acute abnormality is noted in the abdomen or pelvis.   Electronically Signed   By: Marijo Conception, M.D.   On: 09/30/2014 09:00    Scheduled Meds: . amLODipine  10 mg Oral Daily  . amoxicillin-clavulanate  1 tablet Oral BID  . feeding supplement  1 Container Oral TID BM  .  fluconazole  400 mg Oral Daily  . insulin aspart  0-9 Units Subcutaneous 6 times per day  . mesalamine  4.8 g Oral Daily  . metoprolol tartrate  25 mg Oral BID  . multivitamins with iron  1 tablet Oral Daily  . predniSONE  4 mg Oral Q breakfast   Followed by  . [START ON 10/03/2014] predniSONE  2 mg Oral Q breakfast   Followed by  . [START ON 10/06/2014] predniSONE  1 mg Oral Q breakfast   Followed by  . [START ON 10/09/2014] predniSONE  0.5 mg Oral Q breakfast  . psyllium  1 packet Oral BID  . rosuvastatin  40 mg Oral QHS  . saccharomyces boulardii  250 mg Oral BID  . sertraline  25 mg Oral Daily  . [START ON 10/05/2014] sertraline  50 mg Oral Daily  . sodium chloride  3 mL Intravenous Q12H   Continuous Infusions: . 0.9 % NaCl with KCl 20 mEq / L 125 mL/hr at 10/01/14 1205    Principal Problem:   Rectal bleeding Active Problems:   Essential hypertension   Hyponatremia  General weakness   Abscess, perirectal s/p I&D 08/23/2014   Ulcerative colitis   Elevated lactic acid level   Rectal fistula   GI bleed   Malnutrition of moderate degree    Time spent: 25 min    [facility]  [facility] Pager [phone]*. If 7PM-7AM, please contact night-coverage at [email or web address], password [facility] 10/01/2014, 1:08 PM  LOS: 2 days

## 2014-10-02 ENCOUNTER — Telehealth: Payer: Self-pay | Admitting: Gastroenterology

## 2014-10-02 LAB — GLUCOSE, CAPILLARY
GLUCOSE-CAPILLARY: 93 mg/dL (ref 65–99)
GLUCOSE-CAPILLARY: 98 mg/dL (ref 65–99)
Glucose-Capillary: 88 mg/dL (ref 65–99)
Glucose-Capillary: 94 mg/dL (ref 65–99)
Glucose-Capillary: 111 mg/dL — ABNORMAL HIGH (ref 65–99)
Glucose-Capillary: 117 mg/dL — ABNORMAL HIGH (ref 65–99)

## 2014-10-02 LAB — TROPONIN I
Troponin I: <0.03 ng/mL (ref <0.031)
Troponin I: <0.03 ng/mL (ref <0.031)

## 2014-10-02 LAB — CBC
HCT: 27.9 % — ABNORMAL LOW (ref 36.0–46.0)
Hemoglobin: 8.9 g/dL — ABNORMAL LOW (ref 12.0–15.0)
MCH: 29.8 pg (ref 26.0–34.0)
MCHC: 31.9 g/dL (ref 30.0–36.0)
MCV: 93.3 fL (ref 78.0–100.0)
Platelets: 233 10*3/uL (ref 150–400)
RBC: 2.99 MIL/uL — ABNORMAL LOW (ref 3.87–5.11)
RDW: 17.4 % — AB (ref 11.5–15.5)
WBC: 8.8 10*3/uL (ref 4.0–10.5)

## 2014-10-02 LAB — URINE CULTURE: Culture: 10000

## 2014-10-02 LAB — OCCULT BLOOD X 1 CARD TO LAB, STOOL: Fecal Occult Bld: NEGATIVE

## 2014-10-02 MED ORDER — PANTOPRAZOLE SODIUM 40 MG PO TBEC
40.0000 mg | DELAYED_RELEASE_TABLET | Freq: Every day | ORAL | Status: DC
  Administered 2014-10-02 – 2014-10-07 (×6): 40 mg via ORAL
  Filled 2014-10-02 (×6): qty 1

## 2014-10-02 MED ORDER — PIPERACILLIN-TAZOBACTAM 3.375 G IVPB
3.3750 g | Freq: Three times a day (TID) | INTRAVENOUS | Status: DC
  Administered 2014-10-02 – 2014-10-05 (×8): 3.375 g via INTRAVENOUS
  Filled 2014-10-02 (×15): qty 50

## 2014-10-02 NOTE — Progress Notes (Signed)
OT Note  Patient Details Name: [name] MRN: [id number] DOB: 1940/04/16   Cancelled Treatment:    Reason Eval/Treat Not Completed: Other (comment) -- Patient is from [facility] where she is total care for ADLs at this time. Plans to return to SNF at discharge. Will defer all OT needs to SNF.  Natelie Ostrosky A 10/02/2014, 9:45 AM

## 2014-10-02 NOTE — Progress Notes (Signed)
[facility] PROGRESS NOTE  [name] ION:[id number] DOB: [birth year] DOA: 09/29/2014 PCP: Kandice Hams, MD  Assessment/Plan: 1. Rectal bleeding- Patient had bright red blood clots per rectum and also had red blood in ostomy bag,  I called and discussed with GI Dr Carol Ada, who recommends to observe for one day and follow H&H. CT abdomen pelvis showed ostomy in the right lower quadrant no evidence of bowel obstruction or abscess noted. Follow CBC. 2. ? UTI- urine culture is growing 10,000 colonies of  Klebsiella pneumoniae sensitive to zosyn, but resistant to Unasyn/sulbactam. Patient is currently on Augmentin for perianal wound for 2-3 weeks starting from 09/19/14. Will change to Zosyn for pharmacy consult. 3. Perianal wound- status post incision and drainage, packed with gauze. Continue Zosyn. 4. Chest pain- patient has reproducible chest pain, will get EKG stat and check Trop q 6 hr x3 5. Lactic acidosis- resolved, repeat Lactic acid 1.34 6. Hypertension- BP stable, continue amlodipine and metoprolol 7. Anxiety- continue Xanax when necessary and Zoloft 8. Hyponatremia- mild, today sodium 130. Likely from dehydration. Follow BMP in a.m. 9. 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 [name] has unfortunately had 2 prolonged hospital stays recently. On June 20th she was hospitalized with diarrhea and found to have ulcerative colitis and a perirectal abscess. She had a large GI bleed. She also developed bilateral DVTs. She was unable to take anticoagulation therefore an IVC filter was placed. Her abscess/fistula was incised and  drained but had complications in healing due to stool in the wound and consequently a diverting ileostomy was placed on 7/27. At the end of her 2 hospitalizations she was discharged to skilled nursing facility on 8/4. She remains on Remicade, prednisone, and Augmentin. Patient complains of chest pain, though the pain is reproducible  Objective: Filed Vitals:   10/02/14 1019  BP: 120/64  Pulse: 79  Temp: 98 F (36.7 C)  Resp: 18    Intake/Output Summary (Last 24 hours) at 10/02/14 1217 Last data filed at 10/02/14 1019  Gross per 24 hour  Intake 4218.33 ml  Output   1900 ml  Net 2318.33 ml   Filed Weights   09/29/14 0717  Weight: 93.441 kg (206 lb)    Exam:   General:  Appears in no acute distress  Cardiovascular: S1-S2 regular  Respiratory: Clear to auscultation bilaterally  Chest wall- Right side chest wall tenderness to palpation  Abdomen: Soft, non-tender, ostomy in place  Musculoskeletal: No edema of the lower extremities  Data Reviewed: Basic Metabolic Panel:  Recent Labs Lab 09/29/14 0754 09/30/14 0427 10/01/14 1310  NA 132* 131* 130*  K 3.6 4.5 4.8  CL 99* 105 103  CO2 20* 19* 20*  GLUCOSE 86 137* 124*  BUN 7 5* 9  CREATININE 0.59 0.51 0.62  CALCIUM 8.8* 8.1* 8.4*   Liver Function Tests:  Recent Labs Lab 09/29/14 0754  AST 36  ALT 33  ALKPHOS 105  BILITOT 0.6  PROT 6.2*  ALBUMIN 2.5*     Recent Labs Lab 09/29/14 0754 09/30/14 0427 10/01/14 1310 10/02/14 0520  WBC 11.0* 6.8 13.3* 8.8  HGB 10.3* 9.4* 9.3* 8.9*  HCT 32.8* 29.3* 29.2* 27.9*  MCV 96.2 93.9  93.6 93.3  PLT 197 247 255 233   Cardiac Enzymes:  Recent Labs Lab 09/29/14 1035  TROPONINI <0.03    CBG:  Recent Labs Lab 10/01/14 2022 10/02/14 0021 10/02/14 0416 10/02/14 0743 10/02/14 1150  GLUCAP 102* 98 93 88 94    Recent Results (from the past 240 hour(s))  Urine culture     Status: None   Collection Time: 09/29/14  1:30 PM  Result Value Ref Range  Status   Specimen Description URINE, RANDOM  Final   Special Requests NONE  Final   Culture 10,000 COLONIES/mL KLEBSIELLA PNEUMONIAE  Final   Report Status 10/02/2014 FINAL  Final   Organism ID, Bacteria KLEBSIELLA PNEUMONIAE  Final      Susceptibility   Klebsiella pneumoniae - MIC*    AMPICILLIN >=32 RESISTANT Resistant     CEFAZOLIN <=4 SENSITIVE Sensitive     CEFTRIAXONE <=1 SENSITIVE Sensitive     CIPROFLOXACIN <=0.25 SENSITIVE Sensitive     GENTAMICIN <=1 SENSITIVE Sensitive     IMIPENEM <=0.25 SENSITIVE Sensitive     NITROFURANTOIN 64 INTERMEDIATE Intermediate     TRIMETH/SULFA <=20 SENSITIVE Sensitive     AMPICILLIN/SULBACTAM >=32 RESISTANT Resistant     PIP/TAZO <=4 SENSITIVE Sensitive     * 10,000 COLONIES/mL KLEBSIELLA PNEUMONIAE  MRSA PCR Screening     Status: None   Collection Time: 09/29/14  4:16 PM  Result Value Ref Range Status   MRSA by PCR NEGATIVE NEGATIVE Final    Comment:        The GeneXpert MRSA Assay (FDA approved for NASAL specimens only), is one component of a comprehensive MRSA colonization surveillance program. It is not intended to diagnose MRSA infection nor to guide or monitor treatment for MRSA infections.      Studies: No results found.  Scheduled Meds: . amLODipine  10 mg Oral Daily  . amoxicillin-clavulanate  1 tablet Oral BID  . feeding supplement  1 Container Oral TID BM  . fluconazole  400 mg Oral Daily  . insulin aspart  0-9 Units Subcutaneous 6 times per day  . mesalamine  4.8 g Oral Daily  . metoprolol tartrate  25 mg Oral BID  . multivitamins with iron  1 tablet Oral Daily  . [START ON 10/03/2014] predniSONE  2 mg Oral Q breakfast   Followed by  . [START ON 10/06/2014] predniSONE  1 mg Oral Q breakfast   Followed by  . [START ON 10/09/2014] predniSONE  0.5 mg Oral Q breakfast  . psyllium  1 packet Oral BID  . rosuvastatin  40 mg Oral QHS  . saccharomyces boulardii  250 mg Oral BID  . sertraline  25 mg Oral Daily  . [START  ON 10/05/2014] sertraline  50 mg Oral Daily  . sodium chloride  3 mL Intravenous Q12H   Continuous Infusions: . 0.9 % NaCl with KCl 20 mEq / L 125 mL/hr at 10/01/14 2159    Principal Problem:   Rectal bleeding Active Problems:   Essential hypertension   Hyponatremia   General weakness   Abscess, perirectal s/p I&D 08/23/2014   Ulcerative colitis   Elevated lactic acid level   Rectal fistula   GI bleed   Malnutrition of moderate degree    Time spent: 25 min    [facility] Pager [phone]*. If 7PM-7AM, please contact night-coverage at [email or web address], password [facility] 10/02/2014, 12:17 PM  LOS: 3 days

## 2014-10-02 NOTE — Care Management Important Message (Signed)
Important Message  Patient Details  Name: [name] MRN: [id number] Date of Birth: [birth year]   Medicare Important Message Given:  Yes-second notification given    Delorse Lek 10/02/2014, 3:46 PM

## 2014-10-02 NOTE — Consult Note (Signed)
WOC consult requested; refer to consult note on 8/12 for assessment and plan of care. Please re-consult if further assistance is needed.  Thank-you,  Julien Girt MSN, [place], [place], [place], [place]

## 2014-10-02 NOTE — Telephone Encounter (Signed)
See contact message

## 2014-10-02 NOTE — Clinical Social Work Placement (Signed)
   CLINICAL SOCIAL WORK PLACEMENT  NOTE  Date:  10/02/2014  Patient Details  Name: [name] MRN: [id number] Date of Birth: 1940/03/02  Clinical Social Work is seeking post-discharge placement for this patient at the [place] level of care (*CSW will initial, date and re-position this form in  chart as items are completed):  Yes   Patient/family provided with [facility] Work Department's list of facilities offering this level of care within the geographic area requested by the patient (or if unable, by the patient's family).  Yes   Patient/family informed of their freedom to choose among providers that offer the needed level of care, that participate in Medicare, Medicaid or managed care program needed by the patient, have an available bed and are willing to accept the patient.  No   Patient/family informed of [facility]'s ownership interest in [facility] and [facility], as well as of the fact that they are under no obligation to receive care at these facilities.  PASRR submitted to EDS on       PASRR number received on       Existing PASRR number confirmed on 10/02/14     FL2 transmitted to all facilities in geographic area requested by pt/family on 10/02/14     FL2 transmitted to all facilities within larger geographic area on       Patient informed that his/her managed care company has contracts with or will negotiate with certain facilities, including the following:        Yes (CSW talked with patient and daughter Ramona by phone and daughter Ava at the hospital..)   Patient/family informed of bed offers received.  Patient chooses bed at       Physician recommends and patient chooses bed at      Patient to be transferred to   on  .  Patient to be transferred to facility by       Patient family notified on   of transfer.  Name of family member notified:        PHYSICIAN       Additional Comment:  10/02/14: Daughter Ava  Pates does not want patient to return to [facility], however daughter Ricketta Colantonio [phone]) did not express a desire for patient to go to another facility. CSW requested that Ms. Dolin talk with her sister regarding facility placement. The patient did not express a desire to go to another facility, however she appears willing to go to another facility if this is what her daughter's want.    _______________________________________________ Sable Feil, LCSW 10/02/2014, 5:04 PM

## 2014-10-02 NOTE — Progress Notes (Signed)
Daily Rounding Note  10/02/2014, 12:16 PM  LOS: 3 days   SUBJECTIVE:       Another episode of hematochezia with clots, also reported was blood in ostomy bag, both occurred yesterday.  No abdominal pain.  Pain at rectum during packing of peri-rectal abscess. Appetite is poor, still on clears only.  No nausea.  Not sleeping well.    OBJECTIVE:         Vital signs in last 24 hours:    Temp:  [97.4 F (36.3 C)-98.2 F (36.8 C)] 98 F (36.7 C) (08/15 1019) Pulse Rate:  [72-79] 79 (08/15 1019) Resp:  [18-20] 18 (08/15 1019) BP: (107-120)/(48-64) 120/64 mmHg (08/15 1019) SpO2:  [97 %-100 %] 100 % (08/15 1019) Last BM Date: 10/02/14 Filed Weights   09/29/14 0717  Weight: 206 lb (93.441 kg)   General: depressed, alert, comfortable   Heart: RRR Chest: clear bil.  No cough or dyspnea Abdomen: soft, slight tenderness on left side.  Watery dark stool in ostomy, blood tinge to the foam sitting atop the blood.    Extremities: no CCE.  Feet warm Neuro/Psych:  Pleasant, depressed.  Appropriate, oriented x 3.    Intake/Output from previous day: 08/14 0701 - 08/15 0700 In: 3998.3 [P.O.:540; I.V.:3458.3] Out: 1800 [Urine:800; Stool:1000]  Intake/Output this shift: Total I/O In: 580 [P.O.:580] Out: 350 [Stool:350]  Lab Results:  Recent Labs  09/30/14 0427 10/01/14 1310 10/02/14 0520  WBC 6.8 13.3* 8.8  HGB 9.4* 9.3* 8.9*  HCT 29.3* 29.2* 27.9*  PLT 247 255 233   BMET  Recent Labs  09/30/14 0427 10/01/14 1310  NA 131* 130*  K 4.5 4.8  CL 105 103  CO2 19* 20*  GLUCOSE 137* 124*  BUN 5* 9  CREATININE 0.51 0.62  CALCIUM 8.1* 8.4*   Scheduled Meds: . amLODipine  10 mg Oral Daily  . feeding supplement  1 Container Oral TID BM  . fluconazole  400 mg Oral Daily  . insulin aspart  0-9 Units Subcutaneous 6 times per day  . mesalamine  4.8 g Oral Daily  . metoprolol tartrate  25 mg Oral BID  . multivitamins with  iron  1 tablet Oral Daily  . piperacillin-tazobactam (ZOSYN)  IV  3.375 g Intravenous 3 times per day  . [START ON 10/03/2014] predniSONE  2 mg Oral Q breakfast   Followed by  . [START ON 10/06/2014] predniSONE  1 mg Oral Q breakfast   Followed by  . [START ON 10/09/2014] predniSONE  0.5 mg Oral Q breakfast  . psyllium  1 packet Oral BID  . rosuvastatin  40 mg Oral QHS  . saccharomyces boulardii  250 mg Oral BID  . sertraline  25 mg Oral Daily  . [START ON 10/05/2014] sertraline  50 mg Oral Daily  . sodium chloride  3 mL Intravenous Q12H   Continuous Infusions: . 0.9 % NaCl with KCl 20 mEq / L 50 mL/hr (10/02/14 1232)   PRN Meds:.acetaminophen **OR** acetaminophen, ALPRAZolam, morphine injection, ondansetron **OR** ondansetron (ZOFRAN) IV, oxyCODONE   ASSESMENT:   *  Bleeding per rectum/hematochezia.  Hx of same due to UC.   08/08/14 Flex Sig: diffuse edema to 40 cm, pseudomembranes over inflamed but mostly normal tissue 40 to 70 cm.  Biopsies: moderately active to focally severe, active, chronic colitis c/w IBD, no pseudomembranes/PMC. Readmitted 7/5 - 09/21/14 with perirectal abscess, rectal pain. Developed DVT, s/p IVC filter 7/13. Required nutritional support  with TNA but tolerating po diet at discharge.  08/22/14 CT abdomen pelvis with contrast showing extensive gas in the right gluteal fat and musculature extending throughout the retroperitoneum. Thickening, consistent with colitis in the distal transverse and descending colon. 08/23/14: I & D of complex perirectal abscess.  09/06/14 Flex Sig: severe left sided colitis from anal canal to splenic flexure/80 cm. Bleeding from large ulcerations in left colon.  09/13/14: Laparoscopic diverting ileostomy, exam under anesthesia. Normal TI and cecum. 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  Remicade initiated 7/15, 2nd dose 8/2, 3rd dose due 9/1. Also on chronic Lialda and tapering prednisone.    09/29/14 CT: for acute hematochezia.  Ostomy at right LQ.  No abscess or obstruction. No acute inflammation.   *  Acute on chronic , normocytic anemia.  Hgb of 8.9 down ~ 1 gram since arrival but comparable to the 8.7 of 09/18/14. No transfusions since admit.   *  Hyponatremia.   *  Klebsiella UTI.  On Zosyn.    PLAN   *  ? Mesalamine or cortisone enemas?   *  Continue the tapering of prednisone, Lialda.  *  ? Perform EGD to assess the blood seen in ileostomy? (has never had one of these).  *  CBC in AM.   *  Adding po Protonix.     Azucena Freed  10/02/2014, 12:16 PM Pager: [phone]

## 2014-10-02 NOTE — Progress Notes (Signed)
Informed Karen Chafe, PA about the rectal bleeding from yesterday, she said she is aware and she will be checking the patient later.

## 2014-10-02 NOTE — Progress Notes (Signed)
ANTIBIOTIC CONSULT NOTE - INITIAL  Pharmacy Consult for Zosyn Indication: Wound infection and UTI  Allergies  Allergen Reactions  . Clindamycin/Lincomycin Diarrhea    Leads to colitis flare  . Ivp Dye [Iodinated Diagnostic Agents] Other (See Comments)    "almost passed out"    Patient Measurements: Height: 5\' 9"  (175.3 cm) Weight: 206 lb (93.441 kg) IBW/kg (Calculated) : 66.2 Adjusted Body Weight:    Vital Signs: Temp: 98 F (36.7 C) (08/15 1019) Temp Source: Oral (08/15 1019) BP: 120/64 mmHg (08/15 1019) Pulse Rate: 79 (08/15 1019) Intake/Output from previous day: 08/14 0701 - 08/15 0700 In: 3998.3 [P.O.:540; I.V.:3458.3] Out: 1800 [Urine:800; Stool:1000] Intake/Output from this shift: Total I/O In: 580 [P.O.:580] Out: 350 [Stool:350]  Labs:  Recent Labs  09/30/14 0427 10/01/14 1310 10/02/14 0520  WBC 6.8 13.3* 8.8  HGB 9.4* 9.3* 8.9*  PLT 247 255 233  CREATININE 0.51 0.62  --    Estimated Creatinine Clearance: 72.8 mL/min (by C-G formula based on Cr of 0.62). No results for input(s): VANCOTROUGH, VANCOPEAK, VANCORANDOM, GENTTROUGH, GENTPEAK, GENTRANDOM, TOBRATROUGH, TOBRAPEAK, TOBRARND, AMIKACINPEAK, AMIKACINTROU, AMIKACIN in the last 72 hours.   Microbiology:   Medical History: Past Medical History  Diagnosis Date  . CAD (coronary artery disease)     unspecified  . Cerebrovascular disease     bil carotid artery disease.   Marland Kitchen Hyperlipidemia, mixed   . HTN (hypertension)   . Ulcerative colitis   . Diverticulitis   . Colon polyps 2006  . Thyroid disease   . Myocardial infarct   . Carotid artery occlusion 2010  . Diabetes mellitus without complication     Borderline  . GERD (gastroesophageal reflux disease)   . Arthritis     Medications:   Admit Complaint: Rectal bleeding 74 y/o female presented 09/29/2014  7:04 AM with rectal bleeding. She has had 2 recent prolonged hospitalizations. 6/20 she presented with diarrhea and was found to have  ulcerative colitis and a perirectal abscess. She had a large GI bleed. She also developed bilateral DVTs. She was unable to take anticoagulation therefore an IVC filter was placed. 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   Anticoagulation: B DVT s/p IVC filter. Patient continue to have frank blood per rectum and in ostomy bag. SCDs  Infectious Disease: Klebsiella UTI on 10.000 colonies R to current Augmentin. Change to Zosyn to cover perirectal wound and UTI.  Augmentin 8/2>>8/15 Zosyn 8/15>> Fluconazole 7/8>>  Goal of Therapy:  Eradication of infection   Plan:  D/c Augmentin Zosyn 3.375g IV q8hr. Pharmacy will sign off. Please reconsult for further dosing assitance.  Williard Keller S. Alford Highland, PharmD, BCPS Clinical Staff Pharmacist Pager [phone]  Eilene Ghazi Stillinger 10/02/2014,12:53 PM

## 2014-10-03 LAB — CBC
HEMATOCRIT: 27.7 % — AB (ref 36.0–46.0)
Hemoglobin: 9 g/dL — ABNORMAL LOW (ref 12.0–15.0)
MCH: 30.5 pg (ref 26.0–34.0)
MCHC: 32.5 g/dL (ref 30.0–36.0)
MCV: 93.9 fL (ref 78.0–100.0)
Platelets: 194 10*3/uL (ref 150–400)
RBC: 2.95 MIL/uL — AB (ref 3.87–5.11)
RDW: 17.1 % — ABNORMAL HIGH (ref 11.5–15.5)
WBC: 9.8 10*3/uL (ref 4.0–10.5)

## 2014-10-03 LAB — GLUCOSE, CAPILLARY
GLUCOSE-CAPILLARY: 131 mg/dL — AB (ref 65–99)
GLUCOSE-CAPILLARY: 151 mg/dL — AB (ref 65–99)
GLUCOSE-CAPILLARY: 89 mg/dL (ref 65–99)
GLUCOSE-CAPILLARY: 92 mg/dL (ref 65–99)
GLUCOSE-CAPILLARY: 96 mg/dL (ref 65–99)
Glucose-Capillary: 83 mg/dL (ref 65–99)

## 2014-10-03 LAB — TROPONIN I: Troponin I: <0.03 ng/mL (ref <0.031)

## 2014-10-03 MED ORDER — ENSURE ENLIVE PO LIQD: 237.0000 mL | Freq: Three times a day (TID) | ORAL | Status: DC

## 2014-10-03 MED ORDER — ENSURE ENLIVE PO LIQD
237.0000 mL | Freq: Two times a day (BID) | ORAL | Status: DC
  Administered 2014-10-03 – 2014-10-06 (×3): 237 mL via ORAL

## 2014-10-03 NOTE — Progress Notes (Signed)
Physical Therapy Treatment Patient Details Name: [name] MRN: [id number] DOB: Mar 31, 1940 Today's Date: 10/03/2014    History of Present Illness Pt. admitted 09/29/14 from [facility] due to rectal bleeding.  Blood thought to be from rectl bleed vs. sacrl wound.  Ostomy consult reveals pt. has several partial thickness sacral wounds.  Pt also has diverting ileostomy.  PMH inculdes ulcerative colitis, CVA, (B) DVTs, perirectal abscess.     PT Comments    Pt very eager to get OOB and continues to state she wants to increase strength to be able to stand and function more independently. Pt fearful of falling with mobility once sitting and benefits from encouragement and reassurance. Pt encouraged to continue bil LE movement for strengthening and to request lift OOB daily. Will follow.   Follow Up Recommendations  SNF;Supervision/Assistance - 24 hour     Equipment Recommendations       Recommendations for Other Services       Precautions / Restrictions Precautions Precautions: Fall Precaution Comments: ostomy, sacral wound    Mobility  Bed Mobility Overal bed mobility: Needs Assistance;+2 for physical assistance Bed Mobility: Supine to Sit     Supine to sit: Mod assist;+2 for physical assistance     General bed mobility comments: cues for hand placement and sequence with assist to pivot legs to EOb, elevate trunk and max +2 to scoot hips to EOB with reciprocal scooting  Transfers Overall transfer level: Needs assistance     Sit to Stand: Mod assist;+2 physical assistance;From elevated surface         General transfer comment: pt able to stand x 1 from bed with elevated surface and pivot to chair with limited stepping and assist to rotate pelvis. Pt stood an additional 2 times with belt and arm hook technique with max cues for anterior translation and knees blocked to stand and position in chair. Lift pad in chair for return to bed  Ambulation/Gait                 Stairs            Wheelchair Mobility    Modified Rankin (Stroke Patients Only)       Balance Overall balance assessment: Needs assistance Sitting-balance support: Feet supported Sitting balance-Leahy Scale: Poor       Standing balance-Leahy Scale: Zero                      Cognition Arousal/Alertness: Awake/alert Behavior During Therapy: WFL for tasks assessed/performed Overall Cognitive Status: History of cognitive impairments - at baseline                      Exercises      General Comments        Pertinent Vitals/Pain Faces Pain Scale: Hurts even more Pain Location: sacral area Pain Descriptors / Indicators: Sore Pain Intervention(s): Repositioned    Home Living                      Prior Function            PT Goals (current goals can now be found in the care plan section) Progress towards PT goals: Progressing toward goals    Frequency  Min 2X/week    PT Plan Current plan remains appropriate;Frequency needs to be updated    Co-evaluation             End of Session Equipment Utilized  During Treatment: Gait belt Activity Tolerance: Patient tolerated treatment well Patient left: in chair;with call bell/phone within reach;with chair alarm set     Time: 1110-1136 PT Time Calculation (min) (ACUTE ONLY): 26 min  Charges:  $Therapeutic Activity: 23-37 mins                    G Codes:      Melford Aase 2014-10-27, 12:12 PM Elwyn Reach, [place]

## 2014-10-03 NOTE — Progress Notes (Signed)
[facility] PROGRESS NOTE  PRESSLEY BARSKY GYI:[id number] DOB: Apr 04, 1940 DOA: 09/29/2014 PCP: Kandice Hams, MD  Assessment/Plan: 1. Rectal bleeding- Patient had bright red blood clots per rectum and also had red blood in ostomy bag,  I called and discussed with GI Dr Carol Ada, who recommends to observe for one day and follow H&H. CT abdomen pelvis showed ostomy in the right lower quadrant no evidence of bowel obstruction or abscess noted. Patient was seen by Dr Deatra Ina, and plan is to proceed with sigmoidoscopy in am. 2. ? UTI- urine culture is growing 10,000 colonies of  Klebsiella pneumoniae sensitive to zosyn, but resistant to Unasyn/sulbactam. Patient is currently on Augmentin for perianal wound for 2-3 weeks starting from 09/19/14. Will change to Zosyn for pharmacy consult. 3. Perianal wound- status post incision and drainage, packed with gauze. Continue Zosyn. 4. Chest pain- resolved, patient had reproducible chest pain,  EKG showed sinus rhythm,  Trop q 6 hr x3 is negative. 5. Lactic acidosis- resolved, repeat Lactic acid 1.34 6. Hypertension- BP stable, continue amlodipine and metoprolol 7. Anxiety- continue Xanax when necessary and Zoloft 8. Hyponatremia- mild, today sodium 130. Likely from dehydration. Follow BMP in a.m. 9. 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 [name] has unfortunately had 2 prolonged hospital stays recently. On June 20th she was hospitalized with diarrhea and found to have ulcerative colitis and a perirectal abscess. She had a large GI bleed. She also developed bilateral DVTs. She was unable to take  anticoagulation therefore an IVC filter was placed. 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 At the end of her 2 hospitalizations she was discharged to skilled nursing facility on 8/4. She remains on Remicade, prednisone, and Augmentin.  Patient seen and examined, denies new complaints.  Objective: Filed Vitals:   10/03/14 0814  BP: 119/59  Pulse: 85  Temp: 98.2 F (36.8 C)  Resp: 18    Intake/Output Summary (Last 24 hours) at 10/03/14 1317 Last data filed at 10/03/14 1010  Gross per 24 hour  Intake    123 ml  Output   1700 ml  Net  -1577 ml   Filed Weights   09/29/14 0717 10/02/14 2027  Weight: 93.441 kg (206 lb) 95.029 kg (209 lb 8 oz)    Exam:   General:  Appears in no acute distress  Cardiovascular: S1-S2 regular  Respiratory: Clear to auscultation bilaterally  Chest wall- Right side chest wall tenderness to palpation  Abdomen: Soft, non-tender, ostomy in place  Musculoskeletal: No edema of the lower extremities  Data Reviewed: Basic Metabolic Panel:  Recent Labs Lab 09/29/14 0754 09/30/14 0427 10/01/14 1310  NA 132* 131* 130*  K 3.6 4.5 4.8  CL 99* 105 103  CO2 20* 19* 20*  GLUCOSE 86 137* 124*  BUN 7 5* 9  CREATININE 0.59 0.51 0.62  CALCIUM 8.8* 8.1* 8.4*   Liver Function Tests:  Recent Labs Lab 09/29/14 0754  AST 36  ALT 33  ALKPHOS 105  BILITOT 0.6  PROT 6.2*  ALBUMIN 2.5*     Recent Labs Lab 09/29/14 0754 09/30/14 0427  10/01/14 1310 10/02/14 0520 10/03/14 0544  WBC 11.0* 6.8 13.3* 8.8 9.8  HGB 10.3* 9.4* 9.3* 8.9* 9.0*  HCT 32.8* 29.3* 29.2* 27.9* 27.7*  MCV 96.2 93.9 93.6 93.3 93.9  PLT 197 247 255 233 194   Cardiac Enzymes:  Recent Labs Lab 09/29/14 1035 10/02/14 1411 10/02/14 1848 10/02/14 2352  TROPONINI <0.03 <0.03 <0.03 <0.03    CBG:  Recent Labs Lab 10/02/14 1646 10/02/14 2024  10/03/14 0005 10/03/14 0353 10/03/14 0806  GLUCAP 111* 117* 92 89 83    Recent Results (from the past 240 hour(s))  Urine culture     Status: None   Collection Time: 09/29/14  1:30 PM  Result Value Ref Range Status   Specimen Description URINE, RANDOM  Final   Special Requests NONE  Final   Culture 10,000 COLONIES/mL KLEBSIELLA PNEUMONIAE  Final   Report Status 10/02/2014 FINAL  Final   Organism ID, Bacteria KLEBSIELLA PNEUMONIAE  Final      Susceptibility   Klebsiella pneumoniae - MIC*    AMPICILLIN >=32 RESISTANT Resistant     CEFAZOLIN <=4 SENSITIVE Sensitive     CEFTRIAXONE <=1 SENSITIVE Sensitive     CIPROFLOXACIN <=0.25 SENSITIVE Sensitive     GENTAMICIN <=1 SENSITIVE Sensitive     IMIPENEM <=0.25 SENSITIVE Sensitive     NITROFURANTOIN 64 INTERMEDIATE Intermediate     TRIMETH/SULFA <=20 SENSITIVE Sensitive     AMPICILLIN/SULBACTAM >=32 RESISTANT Resistant     PIP/TAZO <=4 SENSITIVE Sensitive     * 10,000 COLONIES/mL KLEBSIELLA PNEUMONIAE  MRSA PCR Screening     Status: None   Collection Time: 09/29/14  4:16 PM  Result Value Ref Range Status   MRSA by PCR NEGATIVE NEGATIVE Final    Comment:        The GeneXpert MRSA Assay (FDA approved for NASAL specimens only), is one component of a comprehensive MRSA colonization surveillance program. It is not intended to diagnose MRSA infection nor to guide or monitor treatment for MRSA infections.      Studies: No results found.  Scheduled Meds: . amLODipine  10 mg Oral Daily  . feeding supplement  1 Container Oral TID BM  . fluconazole  400 mg Oral Daily  . insulin aspart  0-9 Units Subcutaneous 6 times per day  . mesalamine  4.8 g Oral Daily  . metoprolol tartrate  25 mg Oral BID  . multivitamins with iron  1 tablet Oral Daily  . pantoprazole  40 mg Oral Q0600  . piperacillin-tazobactam (ZOSYN)  IV  3.375 g Intravenous 3 times per day  . predniSONE  2 mg Oral Q breakfast   Followed by  . [START ON 10/06/2014]  predniSONE  1 mg Oral Q breakfast   Followed by  . [START ON 10/09/2014] predniSONE  0.5 mg Oral Q breakfast  . psyllium  1 packet Oral BID  . rosuvastatin  40 mg Oral QHS  . saccharomyces boulardii  250 mg Oral BID  . sertraline  25 mg Oral Daily  . [START ON 10/05/2014] sertraline  50 mg Oral Daily  . sodium chloride  3 mL Intravenous Q12H   Continuous Infusions: . 0.9 % NaCl with KCl 20 mEq / L 50 mL/hr (10/02/14 1232)    Principal Problem:   Rectal bleeding Active Problems:   Essential hypertension   Hyponatremia   General weakness   Abscess, perirectal s/p I&D 08/23/2014   Ulcerative colitis   Elevated lactic acid level   Rectal fistula   GI  bleed   Malnutrition of moderate degree    Time spent: 25 min    [facility]  [facility] Pager [phone]*. If 7PM-7AM, please contact night-coverage at [email or web address], password [facility] 10/03/2014, 1:17 PM  LOS: 4 days

## 2014-10-03 NOTE — Progress Notes (Signed)
She's passing blood clots per rectum again.  Hg is stable.  Plan to proceed with sigmoidoscopy in am to reassess.

## 2014-10-03 NOTE — Clinical Social Work Placement (Signed)
   CLINICAL SOCIAL WORK PLACEMENT  NOTE  Date:  10/03/2014  Patient Details  Name: [name] MRN: [id number] Date of Birth: 04-21-1940  Clinical Social Work is seeking post-discharge placement for this patient at the [place] level of care (*CSW will initial, date and re-position this form in  chart as items are completed):  Yes   Patient/family provided with [place] Work Department's list of facilities offering this level of care within the geographic area requested by the patient (or if unable, by the patient's family).  Yes   Patient/family informed of their freedom to choose among providers that offer the needed level of care, that participate in Medicare, Medicaid or managed care program needed by the patient, have an available bed and are willing to accept the patient.  No   Patient/family informed of Center's ownership interest in [facility] and [facility], as well as of the fact that they are under no obligation to receive care at these facilities.  PASRR submitted to EDS on       PASRR number received on       Existing PASRR number confirmed on 10/02/14     FL2 transmitted to all facilities in geographic area requested by pt/family on 10/02/14     FL2 transmitted to all facilities within larger geographic area on       Patient informed that his/her managed care company has contracts with or will negotiate with certain facilities, including the following:        Yes (CSW talked with patient and daughter Ramona by phone and daughter Ava at the hospital..)   Patient/family informed of bed offers received - 10/02/14.  Patient chooses bed at  [facility]     Physician recommends and patient chooses bed at      Patient to be transferred to  [facility] on  .  Patient to be transferred to facility by  ambulance     Patient family notified on   of transfer.  Name of family member notified:         PHYSICIAN       Additional Comment:    _______________________________________________ Sable Feil, LCSW 10/03/2014, 4:40 PM

## 2014-10-03 NOTE — Progress Notes (Addendum)
Nutrition Follow-up  DOCUMENTATION CODES:   Obesity unspecified, Non-severe (moderate) malnutrition in context of acute illness/injury  INTERVENTION:   Discontinue Boost Breeze.  Provide Ensure Enlive po BID with meals, each supplement provides 350 kcal and 20 grams of protein  Encourage adequate PO intake.  NUTRITION DIAGNOSIS:   Inadequate oral intake related to altered GI function as evidenced by per patient/family report; ongoing  GOAL:   Patient will meet greater than or equal to 90% of their needs; not met  MONITOR:   PO intake, Supplement acceptance, Diet advancement, Labs, Weight trends, Skin, I & O's  REASON FOR ASSESSMENT:   Malnutrition Screening Tool    ASSESSMENT:   [name] is a 74 y.o. 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 [name] has unfortunately had 2 prolonged hospital stays recently. On June 20th she was hospitalized with diarrhea and found to have ulcerative colitis and a perirectal abscess. She had a large GI bleed. She also developed bilateral DVTs. She was unable to take anticoagulation therefore an IVC filter was placed. 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 At the end of her 2 hospitalizations she was discharged to skilled nursing facility on 8/4. She remains on Remicade, prednisone, and Augmentin.  Pt reports having a lack of appetite. Meal completion has been varied from 0-100%, with breakfast this AM 50%. Pt has been refusing her Boost Breeze. Pt reports she would like vanilla Ensure instead. RD to modify orders. Plans for sigmoidoscopy tomorrow as pt with bright red blood clots per rectum. Pt was encouraged to eat her food at meals.   Labs and medications reviewed.   Diet Order:  Diet Carb  Modified Fluid consistency:: Thin; Room service appropriate?: Yes Diet NPO time specified  Skin:  Wound (see comment) (dehisced rt rectal wound, st II buttocks, skin tear lt butto), non-pitting edema  Last BM:  8/16-ileostomy  Height:   Ht Readings from Last 1 Encounters:  09/29/14 _0  (1.753 m)    Weight:   Wt Readings from Last 1 Encounters:  10/02/14 209 lb 8 oz (95.029 kg)    Ideal Body Weight:  72.7 kg  BMI:  Body mass index is 30.92 kg/(m^2).  Estimated Nutritional Needs:   Kcal:  2100-2300  Protein:  105-120 grams  Fluid:  >2.0 L  EDUCATION NEEDS:   No education needs identified at this time  Corrin Parker, MS, RD, LDN Pager # [phone] After hours/ weekend pager # [phone]

## 2014-10-03 NOTE — Progress Notes (Signed)
Utilization review completed. Kipper Buch, RN, BSN. 

## 2014-10-04 ENCOUNTER — Encounter (HOSPITAL_COMMUNITY)
Admission: EM | Disposition: A | Discharge status: Skilled Nursing Facility | Payer: Self-pay | Source: Home / Self Care | Attending: Family Medicine

## 2014-10-04 ENCOUNTER — Encounter (HOSPITAL_COMMUNITY): Payer: Self-pay | Admitting: *Deleted

## 2014-10-04 HISTORY — PX: FLEXIBLE SIGMOIDOSCOPY: SHX5431

## 2014-10-04 LAB — GLUCOSE, CAPILLARY
GLUCOSE-CAPILLARY: 89 mg/dL (ref 65–99)
GLUCOSE-CAPILLARY: 85 mg/dL (ref 65–99)
GLUCOSE-CAPILLARY: 91 mg/dL (ref 65–99)
GLUCOSE-CAPILLARY: 98 mg/dL (ref 65–99)

## 2014-10-04 LAB — CBC
HCT: 26.5 % — ABNORMAL LOW (ref 36.0–46.0)
Hemoglobin: 8.6 g/dL — ABNORMAL LOW (ref 12.0–15.0)
MCH: 30.2 pg (ref 26.0–34.0)
MCHC: 32.5 g/dL (ref 30.0–36.0)
MCV: 93 fL (ref 78.0–100.0)
PLATELETS: 166 10*3/uL (ref 150–400)
RBC: 2.85 MIL/uL — AB (ref 3.87–5.11)
RDW: 17 % — ABNORMAL HIGH (ref 11.5–15.5)
WBC: 10.7 10*3/uL — AB (ref 4.0–10.5)

## 2014-10-04 LAB — BASIC METABOLIC PANEL
Anion gap: 7 (ref 5–15)
BUN: 7 mg/dL (ref 6–20)
CALCIUM: 8 mg/dL — AB (ref 8.9–10.3)
CO2: 20 mmol/L — ABNORMAL LOW (ref 22–32)
Chloride: 102 mmol/L (ref 101–111)
Creatinine, Ser: 0.64 mg/dL (ref 0.44–1.00)
GFR calc Af Amer: >60 mL/min (ref >60)
GLUCOSE: 86 mg/dL (ref 65–99)
Potassium: 3.8 mmol/L (ref 3.5–5.1)
Sodium: 129 mmol/L — ABNORMAL LOW (ref 135–145)

## 2014-10-04 SURGERY — SIGMOIDOSCOPY, FLEXIBLE
Anesthesia: Moderate Sedation

## 2014-10-04 MED ORDER — MIDAZOLAM HCL 5 MG/ML IJ SOLN
INTRAMUSCULAR | Status: AC
  Filled 2014-10-04: qty 2

## 2014-10-04 MED ORDER — SODIUM CHLORIDE 0.9 % IV SOLN
INTRAVENOUS | Status: DC
  Administered 2014-10-04: 500 mL via INTRAVENOUS

## 2014-10-04 MED ORDER — FENTANYL CITRATE (PF) 100 MCG/2ML IJ SOLN
INTRAMUSCULAR | Status: AC
Start: 2014-10-04 — End: 2014-10-04
  Filled 2014-10-04: qty 2

## 2014-10-04 MED ORDER — FENTANYL CITRATE (PF) 100 MCG/2ML IJ SOLN
INTRAMUSCULAR | Status: DC | PRN
  Administered 2014-10-04: 25 ug via INTRAVENOUS

## 2014-10-04 MED ORDER — MIDAZOLAM HCL 10 MG/2ML IJ SOLN
INTRAMUSCULAR | Status: DC | PRN
  Administered 2014-10-04 (×2): 1 mg via INTRAVENOUS

## 2014-10-04 NOTE — Progress Notes (Signed)
Sigmoidoscopy demonstrated a mild to moderate colitis.  There is some old blood.  I doubt that she is bleeding significantly from her mucous fistula.  Would not change therapy at this point.

## 2014-10-04 NOTE — Interval H&P Note (Signed)
History and Physical Interval Note:  10/04/2014 12:30 PM  [name]  has presented today for surgery, with the diagnosis of rectal bleeding, ulcerative colitis.  The various methods of treatment have been discussed with the patient and family. After consideration of risks, benefits and other options for treatment, the patient has consented to  Procedure(s): FLEXIBLE SIGMOIDOSCOPY (N/A) as a surgical intervention .  The patient's history has been reviewed, patient examined, no change in status, stable for surgery.  I have reviewed the patient's chart and labs.  Questions were answered to the patient's satisfaction.    The recent H&P (dated *10/03/14*) was reviewed, the patient was examined and there is no change in the patients condition since that H&P was completed.   Erskine Emery  10/04/2014, 12:30 PM    Erskine Emery

## 2014-10-04 NOTE — Consult Note (Signed)
   [facility] CM Inpatient Consult   10/04/2014  [name] 1941-02-05 [id number]   Went to bedside to speak with patient about [facility] follow up. Patient went to SNF at last hospital discharge and family states patient will go to another facility after this hospital discharge. Will continue to follow. Contact information left at bedside.   Marthenia Rolling, MSN-Ed, RN,BSN [facility] Liaison [phone]

## 2014-10-04 NOTE — Progress Notes (Signed)
[facility] PROGRESS NOTE  [name] SWF:[id number] DOB: [birth year] DOA: 09/29/2014 PCP: Kandice Hams, MD  Assessment/Plan: 1. Rectal bleeding- Patient had bright red blood clots per rectum and also had red blood in ostomy bag,  I called and discussed with GI Dr Carol Ada, who recommends to observe for one day and follow H&H. CT abdomen pelvis showed ostomy in the right lower quadrant no evidence of bowel obstruction or abscess noted. Patient was seen by Dr Deatra Ina. Pt is s/p sigmoidoscopy. 2. ? UTI- urine culture is growing 10,000 colonies of  Klebsiella pneumoniae sensitive to zosyn, but resistant to Unasyn/sulbactam.  Changed to Zosyn for pharmacy consult. 3. Perianal wound- status post incision and drainage, packed with gauze. Continue Zosyn. 4. Chest pain- resolved, patient had reproducible chest pain,  EKG showed sinus rhythm,  Trop q 6 hr x3 is negative. 5. Lactic acidosis- resolved, repeat Lactic acid 1.34 6. Hypertension- BP stable, continue amlodipine and metoprolol 7. Anxiety- continue Xanax when necessary and Zoloft 8. Hyponatremia- stable 9. 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 [name] has unfortunately had 2 prolonged hospital stays recently. On June 20th she was hospitalized with diarrhea and found to have ulcerative colitis and a perirectal abscess. She had a large GI bleed. She also developed bilateral DVTs. She was unable to take anticoagulation therefore an IVC filter was placed. Her abscess/fistula was incised and drained but had complications in healing due to stool in the wound and consequently a  diverting ileostomy was placed on 7/27. At the end of her 2 hospitalizations she was discharged to skilled nursing facility on 8/4. She remains on Remicade, prednisone, and Augmentin.  Pt has no new complaints. No acute issues overnight.  Objective: Filed Vitals:   10/04/14 1350  BP: 90/50  Pulse: 74  Temp: 98.4 F (36.9 C)  Resp: 18    Intake/Output Summary (Last 24 hours) at 10/04/14 1743 Last data filed at 10/04/14 1325  Gross per 24 hour  Intake    690 ml  Output    225 ml  Net    465 ml   Filed Weights   09/29/14 0717 10/02/14 2027 10/03/14 2036  Weight: 93.441 kg (206 lb) 95.029 kg (209 lb 8 oz) 98.5 kg (217 lb 2.5 oz)    Exam:   General:  Appears in no acute distress  Cardiovascular: S1-S2 regular  Respiratory: Clear to auscultation bilaterally, no wheezes  Chest wall- Right side chest wall tenderness to palpation  Abdomen: Soft, non-tender, ostomy in place  Musculoskeletal: No edema of the lower extremities  Data Reviewed: Basic Metabolic Panel:  Recent Labs Lab 09/29/14 0754 09/30/14 0427 10/01/14 1310 10/04/14 0447  NA 132* 131* 130* 129*  K 3.6 4.5 4.8 3.8  CL 99* 105 103 102  CO2 20* 19* 20* 20*  GLUCOSE 86 137* 124* 86  BUN 7 5* 9 7  CREATININE 0.59 0.51 0.62 0.64  CALCIUM 8.8* 8.1* 8.4* 8.0*   Liver Function Tests:  Recent Labs Lab 09/29/14 0754  AST 36  ALT 33  ALKPHOS 105  BILITOT 0.6  PROT 6.2*  ALBUMIN 2.5*     Recent Labs Lab 09/30/14 0427 10/01/14 1310 10/02/14 0520 10/03/14 0544  10/04/14 0906  WBC 6.8 13.3* 8.8 9.8 10.7*  HGB 9.4* 9.3* 8.9* 9.0* 8.6*  HCT 29.3* 29.2* 27.9* 27.7* 26.5*  MCV 93.9 93.6 93.3 93.9 93.0  PLT 247 255 233 194 166   Cardiac Enzymes:  Recent Labs Lab 09/29/14 1035 10/02/14 1411 10/02/14 1848 10/02/14 2352  TROPONINI <0.03 <0.03 <0.03 <0.03    CBG:  Recent Labs Lab 10/03/14 2020 10/04/14 0419 10/04/14 0740 10/04/14 1341 10/04/14 1659  GLUCAP 131* 89 85 91 98     Recent Results (from the past 240 hour(s))  Urine culture     Status: None   Collection Time: 09/29/14  1:30 PM  Result Value Ref Range Status   Specimen Description URINE, RANDOM  Final   Special Requests NONE  Final   Culture 10,000 COLONIES/mL KLEBSIELLA PNEUMONIAE  Final   Report Status 10/02/2014 FINAL  Final   Organism ID, Bacteria KLEBSIELLA PNEUMONIAE  Final      Susceptibility   Klebsiella pneumoniae - MIC*    AMPICILLIN >=32 RESISTANT Resistant     CEFAZOLIN <=4 SENSITIVE Sensitive     CEFTRIAXONE <=1 SENSITIVE Sensitive     CIPROFLOXACIN <=0.25 SENSITIVE Sensitive     GENTAMICIN <=1 SENSITIVE Sensitive     IMIPENEM <=0.25 SENSITIVE Sensitive     NITROFURANTOIN 64 INTERMEDIATE Intermediate     TRIMETH/SULFA <=20 SENSITIVE Sensitive     AMPICILLIN/SULBACTAM >=32 RESISTANT Resistant     PIP/TAZO <=4 SENSITIVE Sensitive     * 10,000 COLONIES/mL KLEBSIELLA PNEUMONIAE  MRSA PCR Screening     Status: None   Collection Time: 09/29/14  4:16 PM  Result Value Ref Range Status   MRSA by PCR NEGATIVE NEGATIVE Final    Comment:        The GeneXpert MRSA Assay (FDA approved for NASAL specimens only), is one component of a comprehensive MRSA colonization surveillance program. It is not intended to diagnose MRSA infection nor to guide or monitor treatment for MRSA infections.      Studies: No results found.  Scheduled Meds: . amLODipine  10 mg Oral Daily  . feeding supplement (ENSURE ENLIVE)  237 mL Oral BID WC  . fluconazole  400 mg Oral Daily  . insulin aspart  0-9 Units Subcutaneous 6 times per day  . mesalamine  4.8 g Oral Daily  . metoprolol tartrate  25 mg Oral BID  . multivitamins with iron  1 tablet Oral Daily  . pantoprazole  40 mg Oral Q0600  . piperacillin-tazobactam (ZOSYN)  IV  3.375 g Intravenous 3 times per day  . predniSONE  2 mg Oral Q breakfast   Followed by  . [START ON 10/06/2014] predniSONE  1 mg Oral Q breakfast   Followed by  . [START ON  10/09/2014] predniSONE  0.5 mg Oral Q breakfast  . psyllium  1 packet Oral BID  . rosuvastatin  40 mg Oral QHS  . saccharomyces boulardii  250 mg Oral BID  . [START ON 10/05/2014] sertraline  50 mg Oral Daily  . sodium chloride  3 mL Intravenous Q12H   Continuous Infusions: . 0.9 % NaCl with KCl 20 mEq / L 50 mL/hr at 10/03/14 1344    Principal Problem:   Rectal bleeding Active Problems:   Essential hypertension   Hyponatremia   General weakness   Abscess, perirectal s/p I&D 08/23/2014   Ulcerative colitis   Elevated lactic acid level   Rectal fistula   GI bleed   Malnutrition of moderate degree  Time spent: 25 min    Velvet Bathe  [facility] Pager [phone]. If 7PM-7AM, please contact night-coverage at [email or web address], password [facility] 10/04/2014, 5:43 PM  LOS: 5 days

## 2014-10-04 NOTE — H&P (View-Only) (Signed)
She's passing blood clots per rectum again.  Hg is stable.  Plan to proceed with sigmoidoscopy in am to reassess.

## 2014-10-04 NOTE — Op Note (Signed)
[facility] [address] [place], [address]   FLEX SIGMOIDOSCOPY PROCEDURE REPORT  PATIENT: [name], Young  MR#: [id number] BIRTHDATE: 1940/05/10 , 63  yrs. old GENDER: female ENDOSCOPIST: Inda Castle, MD REFERRED BY: PROCEDURE DATE:  2014-10-05 PROCEDURE:   Sigmoidoscopy, diagnostic INDICATIONS:hematochezia. MEDICATIONS: Fentanyl 25 mcg IV and Versed 2 mg IV  DESCRIPTION OF PROCEDURE:    Physical exam was performed.  Informed consent was obtained from the patient after explaining the benefits, risks, and alternatives to procedure.  The patient was connected to monitor and placed in left lateral position. Continuous oxygen was provided by nasal cannula and IV medicine administered through an indwelling cannula.  After administration of sedation and rectal exam, the patients rectum was intubated and the     colonoscope was advanced under direct visualization to the cecum.  The scope was removed slowly by carefully examining the color, texture, anatomy, and integrity mucosa on the way out.  The patient was recovered in endoscopy and discharged home in satisfactory condition. Estimated blood loss is zero unless otherwise noted in this procedure report.       COLON FINDINGS: Scope was passed to the mid sigmoid.  There was mild to moderate mucosal erythema and fairly minimal edema throughout the left colon..  There was a small amount of old blood.  No active bleeding was seen.  PREP QUALITY:  COMPLICATIONS: None  ENDOSCOPIC IMPRESSION: colitis?"mild  RECOMMENDATIONS: continue current medications      _______________________________ eSigned:  Inda Castle, MD 10/05/14 12:59 PM   CPT CODES: ICD CODES:  The ICD and CPT codes recommended by this software are interpretations from the data that the clinical staff has captured with the software.  The verification of the translation of this report to the ICD and CPT codes and  modifiers is the sole responsibility of the health care institution and practicing physician where this report was generated.  [place]. will not be held responsible for the validity of the ICD and CPT codes included on this report.  AMA assumes no liability for data contained or not contained herein. CPT is a Designer, television/film set of the [employer].

## 2014-10-04 NOTE — Progress Notes (Signed)
This Probation officer called the kitchen to place the order earlier but patient c/o not getting any tray, f/u with kitchen, according them patient refused the tray, this writer placed order again.

## 2014-10-04 NOTE — Clinical Social Work Placement (Signed)
   CLINICAL SOCIAL WORK PLACEMENT  NOTE  Date:  10/04/2014  Patient Details  Name: [name] MRN: [id number] Date of Birth: 08/21/1940  Clinical Social Work is seeking post-discharge placement for this patient at the [place] level of care (*CSW will initial, date and re-position this form in  chart as items are completed):  Yes   Patient/family provided with [place] Work Department's list of facilities offering this level of care within the geographic area requested by the patient (or if unable, by the patient's family).  Yes   Patient/family informed of their freedom to choose among providers that offer the needed level of care, that participate in Medicare, Medicaid or managed care program needed by the patient, have an available bed and are willing to accept the patient.  No   Patient/family informed of [facility]'s ownership interest in [facility] and [facility], as well as of the fact that they are under no obligation to receive care at these facilities.  PASRR submitted to EDS on       PASRR number received on       Existing PASRR number confirmed on 10/02/14     FL2 transmitted to all facilities in geographic area requested by pt/family on 10/02/14     FL2 transmitted to all facilities within larger geographic area on       Patient informed that his/her managed care company has contracts with or will negotiate with certain facilities, including the following:        Yes (CSW talked with patient and daughter Ramona by phone and daughter Ava at the hospital..)   Patient/family informed of bed offers received.  Patient chooses bed at  [facility]     Physician recommends and patient chooses bed at      Patient to be transferred to   on  .  Patient to be transferred to facility by       Patient family notified on   of transfer.  Name of family member notified:        PHYSICIAN       Additional Comment:   10/04/14 - Call made to admissions staff at [facility] to inform them of family choosing their facility.   _______________________________________________ Sable Feil, LCSW 10/04/2014, 11:16 AM

## 2014-10-04 NOTE — Clinical Social Work Note (Signed)
[name] admissions staff person contacted and advised that patient has chosen their facility for short-term rehab. They were advised that once ready for discharge, [name] representative will be contacted for authorization.   [name], MSW, LCSW Licensed Clinical Social Worker [place] [phone]

## 2014-10-05 ENCOUNTER — Encounter (HOSPITAL_COMMUNITY): Payer: Self-pay | Admitting: Gastroenterology

## 2014-10-05 DIAGNOSIS — Z960 Presence of urogenital implants: Secondary | ICD-10-CM

## 2014-10-05 DIAGNOSIS — K6389 Other specified diseases of intestine: Secondary | ICD-10-CM

## 2014-10-05 DIAGNOSIS — L8990 Pressure ulcer of unspecified site, unspecified stage: Secondary | ICD-10-CM | POA: Insufficient documentation

## 2014-10-05 DIAGNOSIS — Z932 Ileostomy status: Secondary | ICD-10-CM

## 2014-10-05 DIAGNOSIS — K529 Noninfective gastroenteritis and colitis, unspecified: Secondary | ICD-10-CM

## 2014-10-05 LAB — GLUCOSE, CAPILLARY
GLUCOSE-CAPILLARY: 89 mg/dL (ref 65–99)
GLUCOSE-CAPILLARY: 104 mg/dL — AB (ref 65–99)
Glucose-Capillary: 112 mg/dL — ABNORMAL HIGH (ref 65–99)
Glucose-Capillary: 119 mg/dL — ABNORMAL HIGH (ref 65–99)
Glucose-Capillary: 75 mg/dL (ref 65–99)
Glucose-Capillary: 78 mg/dL (ref 65–99)
Glucose-Capillary: 96 mg/dL (ref 65–99)

## 2014-10-05 NOTE — Care Management Important Message (Signed)
Important Message  Patient Details  Name: [name] MRN: [id number] Date of Birth: 1940-12-17   Medicare Important Message Given:  Yes-third notification given    Delorse Lek 10/05/2014, 2:24 PM

## 2014-10-05 NOTE — Progress Notes (Signed)
Daily Rounding Note  10/05/2014, 10:17 AM  LOS: 6 days   SUBJECTIVE:       Some blood from rectum, none in ostomy. Tearful with RN this AM  OBJECTIVE:         Vital signs in last 24 hours:    Temp:  [97.5 F (36.4 C)-98.8 F (37.1 C)] 98.7 F (37.1 C) (08/18 0859) Pulse Rate:  [71-92] 88 (08/18 0859) Resp:  [12-21] 18 (08/18 0859) BP: (70-114)/(23-76) 95/57 mmHg (08/18 0859) SpO2:  [91 %-99 %] 99 % (08/18 0859) Weight:  [217 lb 13 oz (98.8 kg)] 217 lb 13 oz (98.8 kg) (08/17 2026) Last BM Date: 10/04/14 Filed Weights   10/02/14 2027 10/03/14 2036 10/04/14 2026  Weight: 209 lb 8 oz (95.029 kg) 217 lb 2.5 oz (98.5 kg) 217 lb 13 oz (98.8 kg)   General: seems less anxious, dtr and Education officer, museum in room.    Heart: RRR Chest: clear bil Abdomen: soft, NT, ND.  Not tender, active BS.    Extremities: no CCE Neuro/Psych:  Seems less anxious currently during brief visit  Intake/Output from previous day: 08/17 0701 - 08/18 0700 In: 400 [I.V.:400] Out: 1200 [Urine:1000; Stool:200]  Intake/Output this shift: Total I/O In: 200 [P.O.:200] Out: -   Lab Results:  Recent Labs  10/03/14 0544 10/04/14 0906  WBC 9.8 10.7*  HGB 9.0* 8.6*  HCT 27.7* 26.5*  PLT 194 166   BMET  Recent Labs  10/04/14 0447  NA 129*  K 3.8  CL 102  CO2 20*  GLUCOSE 86  BUN 7  CREATININE 0.64  CALCIUM 8.0*    Studies/Results: No results found.   Scheduled Meds: . amLODipine  10 mg Oral Daily  . feeding supplement (ENSURE ENLIVE)  237 mL Oral BID WC  . fluconazole  400 mg Oral Daily  . insulin aspart  0-9 Units Subcutaneous 6 times per day  . mesalamine  4.8 g Oral Daily  . metoprolol tartrate  25 mg Oral BID  . multivitamins with iron  1 tablet Oral Daily  . pantoprazole  40 mg Oral Q0600  . piperacillin-tazobactam (ZOSYN)  IV  3.375 g Intravenous 3 times per day  . predniSONE  2 mg Oral Q breakfast   Followed by  .  [START ON 10/06/2014] predniSONE  1 mg Oral Q breakfast   Followed by  . [START ON 10/09/2014] predniSONE  0.5 mg Oral Q breakfast  . psyllium  1 packet Oral BID  . rosuvastatin  40 mg Oral QHS  . saccharomyces boulardii  250 mg Oral BID  . sertraline  50 mg Oral Daily  . sodium chloride  3 mL Intravenous Q12H   Continuous Infusions: . 0.9 % NaCl with KCl 20 mEq / L 50 mL/hr at 10/04/14 2202   PRN Meds:.acetaminophen **OR** acetaminophen, ALPRAZolam, morphine injection, ondansetron **OR** ondansetron (ZOFRAN) IV, oxyCODONE   ASSESMENT:   * Bleeding per rectum/hematochezia. Hx of same due to UC; though with development of recto-colonic fistula question if pt has Crohn's rather than UC?   08/08/14 Flex Sig: diffuse edema to 40 cm, pseudomembranes over inflamed but mostly normal tissue 40 to 70 cm. Biopsies: moderately active to focally severe, active, chronic colitis c/w IBD, no pseudomembranes/PMC. Readmitted 7/5 - 09/21/14 with perirectal abscess, rectal pain. Developed DVT, s/p IVC filter 7/13. Required nutritional support with TNA but tolerating po diet at discharge.  08/22/14 CT abdomen pelvis with contrast showing extensive  gas in the right gluteal fat and musculature extending throughout the retroperitoneum. Thickening, consistent with colitis in the distal transverse and descending colon. 08/23/14: I & D of complex perirectal abscess.  09/06/14 Flex Sig: severe left sided colitis from anal canal to splenic flexure/80 cm. Bleeding from large ulcerations in left colon.  09/13/14: Laparoscopic diverting ileostomy, exam under anesthesia. Normal TI and cecum. 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  Remicade initiated 7/15, 2nd dose 8/2, 3rd dose due 9/1. Also on chronic Lialda and tapering prednisone.  09/29/14 CT: for acute hematochezia. Ostomy at right LQ. No abscess or obstruction. No acute inflammation.  Flex sig 10/04/14: improved  appearance of colitis with mild to moderated erythema and miminal edem, small amount old blood but no active bleeding.  Since last discharge, was on Augmentin and Diflucan per ID rec.  Since this admit, on Diflucan only.  Abscess clx 08/23/14:  Gram Stain NO WBC SEEN  NO SQUAMOUS EPITHELIAL CELLS SEEN  MODERATE GRAM NEGATIVE RODS  RARE YEAST  RARE GRAM POSITIVE COCCI       Culture MULTIPLE ORGANISMS PRESENT, NONE PREDOMINANT  Note: NO STAPHYLOCOCCUS AUREUS ISOLATED NO GROUP A STREP (S.PYOGENES) ISOLATED           * Acute on chronic , normocytic anemia. Hgb of 8.9 down ~ 1 gram since arrival but comparable to the 8.7 of 09/18/14. No transfusions since admit.   * Hyponatremia.   * Klebsiella UTI. On Zosyn since 8/15.     *  Normocytic anemia.       *  Anxiety/depression.  On zoloft and prn Xanax.    PLAN   *  Suspect pt will have recurrent bleeding in near term.  Hopefully as Remicade takes full effect and colitis continues to improve the bleeding will cease.  Need to alert NH that rectal bleeding is expected and does not require visit to ED.  If continues she ought to have periodic CBC.    *  Resume Augmentin for fistula? ID (Dr Graylon Good) rec of 8/4 was for both of these for 2 to 3 weeks.  Now on Zosyn for Klebsiella.  Would ask ID to guide abx therapy once again.   *  Continue Mesalamine (Lialda) and tapering of Prednisone through to its discontinuation on 8/25.  Wonder if [employer] ought to be stopped given the yeast seen on abscess clx of 08/23/14 (perhaps ID could guide [place] here as well)   *  Has Remicade infusions set up for next few months, next is on 9/1.  Has OV with [name] PA-C at GI office 9/13. Has 8/22 appt with surgeon Dr Barry Dienes.   *  GI signing off.     Azucena Freed  10/05/2014, 10:17 AM Pager: [phone]

## 2014-10-05 NOTE — Clinical Social Work Note (Signed)
CSW informed by P. Ambelal, [place] for patient's husband that Mr. Cerniglia will discharge to [facility] today. CSW talked with patient and daughter and patient wants to return to [facility] at discharge and also wants to be in the room with her husband. Her rooming preference will be conveyed to facility by CSW Ambelal.   Atom Solivan Givens, MSW, LCSW Licensed Clinical Social Worker [place] [phone]

## 2014-10-05 NOTE — Progress Notes (Signed)
[facility] PROGRESS NOTE  [name] NGE:[id number] DOB: 07-Dec-1940 DOA: 09/29/2014 PCP: Kandice Hams, MD  Assessment/Plan: 1. Rectal bleeding- Gi on board. Recommending continuing mesalamine and tapering prednisone through its discontinuation on 8/25. 2. ? UTI- urine culture is growing 10,000 colonies of  Klebsiella pneumoniae sensitive to zosyn, but resistant to Unasyn/sulbactam.  Changed to Zosyn. ID consulted 3. Perianal wound- status post incision and drainage, packed with gauze. ID to assist with antibiotics selection 4. Chest pain- resolved, patient had reproducible chest pain,  EKG showed sinus rhythm,  Trop q 6 hr x3 is negative. 5. Lactic acidosis- resolved, repeat Lactic acid 1.34 6. Hypertension- given soft blood pressures will discontinue antihypertensives 7. Anxiety- continue Xanax when necessary and Zoloft 8. Hyponatremia- stable 9. 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 [name] has unfortunately had 2 prolonged hospital stays recently. On June 20th she was hospitalized with diarrhea and found to have ulcerative colitis and a perirectal abscess. She had a large GI bleed. She also developed bilateral DVTs. She was unable to take anticoagulation therefore an IVC filter was placed. 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 At the end of her 2 hospitalizations she was discharged to skilled nursing facility on 8/4. She remains on Remicade, prednisone, and Augmentin.  Pt has no new complaints. No acute  issues overnight.  Objective: Filed Vitals:   10/05/14 0859  BP: 95/57  Pulse: 88  Temp: 98.7 F (37.1 C)  Resp: 18    Intake/Output Summary (Last 24 hours) at 10/05/14 1222 Last data filed at 10/05/14 0926  Gross per 24 hour  Intake    600 ml  Output   1200 ml  Net   -600 ml   Filed Weights   10/02/14 2027 10/03/14 2036 10/04/14 2026  Weight: 95.029 kg (209 lb 8 oz) 98.5 kg (217 lb 2.5 oz) 98.8 kg (217 lb 13 oz)    Exam:   General:  Appears in no acute distress  Cardiovascular: S1-S2 regular  Respiratory: Clear to auscultation bilaterally, no wheezes  Chest wall- Right side chest wall tenderness to palpation  Abdomen: Soft, non-tender, ostomy in place  Musculoskeletal: No edema of the lower extremities  Data Reviewed: Basic Metabolic Panel:  Recent Labs Lab 09/29/14 0754 09/30/14 0427 10/01/14 1310 10/04/14 0447  NA 132* 131* 130* 129*  K 3.6 4.5 4.8 3.8  CL 99* 105 103 102  CO2 20* 19* 20* 20*  GLUCOSE 86 137* 124* 86  BUN 7 5* 9 7  CREATININE 0.59 0.51 0.62 0.64  CALCIUM 8.8* 8.1* 8.4* 8.0*   Liver Function Tests:  Recent Labs Lab 09/29/14 0754  AST 36  ALT 33  ALKPHOS 105  BILITOT 0.6  PROT 6.2*  ALBUMIN 2.5*     Recent Labs Lab 09/30/14 0427 10/01/14 1310 10/02/14 0520 10/03/14 0544 10/04/14 0906  WBC 6.8 13.3* 8.8 9.8 10.7*  HGB 9.4* 9.3* 8.9* 9.0* 8.6*  HCT 29.3* 29.2* 27.9* 27.7* 26.5*  MCV 93.9 93.6 93.3 93.9 93.0  PLT 247 255 233 194 166   Cardiac Enzymes:  Recent Labs Lab 09/29/14  1035 10/02/14 1411 10/02/14 1848 10/02/14 2352  TROPONINI <0.03 <0.03 <0.03 <0.03    CBG:  Recent Labs Lab 10/04/14 2011 10/04/14 2359 10/05/14 0455 10/05/14 0801 10/05/14 1141  GLUCAP 89 96 78 75 104*    Recent Results (from the past 240 hour(s))  Urine culture     Status: None   Collection Time: 09/29/14  1:30 PM  Result Value Ref Range Status   Specimen Description URINE, RANDOM  Final   Special Requests NONE  Final    Culture 10,000 COLONIES/mL KLEBSIELLA PNEUMONIAE  Final   Report Status 10/02/2014 FINAL  Final   Organism ID, Bacteria KLEBSIELLA PNEUMONIAE  Final      Susceptibility   Klebsiella pneumoniae - MIC*    AMPICILLIN >=32 RESISTANT Resistant     CEFAZOLIN <=4 SENSITIVE Sensitive     CEFTRIAXONE <=1 SENSITIVE Sensitive     CIPROFLOXACIN <=0.25 SENSITIVE Sensitive     GENTAMICIN <=1 SENSITIVE Sensitive     IMIPENEM <=0.25 SENSITIVE Sensitive     NITROFURANTOIN 64 INTERMEDIATE Intermediate     TRIMETH/SULFA <=20 SENSITIVE Sensitive     AMPICILLIN/SULBACTAM >=32 RESISTANT Resistant     PIP/TAZO <=4 SENSITIVE Sensitive     * 10,000 COLONIES/mL KLEBSIELLA PNEUMONIAE  MRSA PCR Screening     Status: None   Collection Time: 09/29/14  4:16 PM  Result Value Ref Range Status   MRSA by PCR NEGATIVE NEGATIVE Final    Comment:        The GeneXpert MRSA Assay (FDA approved for NASAL specimens only), is one component of a comprehensive MRSA colonization surveillance program. It is not intended to diagnose MRSA infection nor to guide or monitor treatment for MRSA infections.      Studies: No results found.  Scheduled Meds: . amLODipine  10 mg Oral Daily  . feeding supplement (ENSURE ENLIVE)  237 mL Oral BID WC  . fluconazole  400 mg Oral Daily  . insulin aspart  0-9 Units Subcutaneous 6 times per day  . mesalamine  4.8 g Oral Daily  . metoprolol tartrate  25 mg Oral BID  . multivitamins with iron  1 tablet Oral Daily  . pantoprazole  40 mg Oral Q0600  . piperacillin-tazobactam (ZOSYN)  IV  3.375 g Intravenous 3 times per day  . [START ON 10/06/2014] predniSONE  1 mg Oral Q breakfast   Followed by  . [START ON 10/09/2014] predniSONE  0.5 mg Oral Q breakfast  . psyllium  1 packet Oral BID  . rosuvastatin  40 mg Oral QHS  . saccharomyces boulardii  250 mg Oral BID  . sertraline  50 mg Oral Daily  . sodium chloride  3 mL Intravenous Q12H   Continuous Infusions: . 0.9 % NaCl with KCl  20 mEq / L 50 mL/hr at 10/04/14 2202    Principal Problem:   Rectal bleeding Active Problems:   Essential hypertension   Hyponatremia   General weakness   Abscess, perirectal s/p I&D 08/23/2014   Ulcerative colitis   Elevated lactic acid level   Rectal fistula   GI bleed   Malnutrition of moderate degree   Inflammatory bowel disease    Time spent: 25 min    [name]  [facility] Pager [phone]. If 7PM-7AM, please contact night-coverage at [email or web address], password [facility] 10/05/2014, 12:22 PM  LOS: 6 days

## 2014-10-05 NOTE — Progress Notes (Signed)
Patient ID: [name], female   DOB: 12/24/1940, 74 y.o.   MRN: [id number]         [facility] for Infectious Disease    Date of Admission:  09/29/2014   Total days of antibiotics 45        Day 42 fluconazole        Day 4 piperacillin tazobactam          Principal Problem:   Abscess, perirectal s/p I&D 08/23/2014 Active Problems:   Rectal fistula   Rectal bleeding   Inflammatory bowel disease   Essential hypertension   Hyponatremia   General weakness   Elevated lactic acid level   Malnutrition of moderate degree   . feeding supplement (ENSURE ENLIVE)  237 mL Oral BID WC  . fluconazole  400 mg Oral Daily  . insulin aspart  0-9 Units Subcutaneous 6 times per day  . mesalamine  4.8 g Oral Daily  . multivitamins with iron  1 tablet Oral Daily  . pantoprazole  40 mg Oral Q0600  . piperacillin-tazobactam (ZOSYN)  IV  3.375 g Intravenous 3 times per day  . [START ON 10/06/2014] predniSONE  1 mg Oral Q breakfast   Followed by  . [START ON 10/09/2014] predniSONE  0.5 mg Oral Q breakfast  . psyllium  1 packet Oral BID  . rosuvastatin  40 mg Oral QHS  . saccharomyces boulardii  250 mg Oral BID  . sertraline  50 mg Oral Daily  . sodium chloride  3 mL Intravenous Q12H    Past Medical History  Diagnosis Date  . CAD (coronary artery disease)     unspecified  . Cerebrovascular disease 2010    bil carotid artery disease. left CEA 02/2004.   Marland Kitchen Hyperlipidemia, mixed   . HTN (hypertension)   . Ulcerative colitis 2002  . Colon polyps 2006  . Thyroid disease   . Myocardial infarct 1998  . Carotid artery occlusion 2010  . Diabetes mellitus without complication     Borderline  . GERD (gastroesophageal reflux disease)   . Arthritis   . 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     Social History  Substance Use Topics  . Smoking status: Former Smoker -- 0.30 packs/day for 40 years    Types: Cigarettes    Quit date: 02/17/2013  . Smokeless tobacco: Never Used  . Alcohol  Use: No    Family History  Problem Relation Age of Onset  . Colitis Brother     1 bro with Crohn's, another bro with unspecified colitis.   . Stomach cancer Mother   . Stroke Father     died  . Colon cancer Neg Hx    Allergies  Allergen Reactions  . Clindamycin/Lincomycin Diarrhea    Leads to colitis flare  . Ivp Dye [Iodinated Diagnostic Agents] Other (See Comments)    "almost passed out"    OBJECTIVE: Filed Vitals:   10/04/14 1803 10/04/14 2026 10/05/14 0500 10/05/14 0859  BP: 90/50 114/57 98/59 95/57   Pulse: 79 74 92 88  Temp: 98.4 F (36.9 C) 97.5 F (36.4 C) 98.8 F (37.1 C) 98.7 F (37.1 C)  TempSrc: Oral Oral Oral Oral  Resp: 18 18 18 18   Height:      Weight:  217 lb 13 oz (98.8 kg)    SpO2: 97% 97% 97% 99%   Body mass index is 32.15 kg/(m^2).  General: alert and sitting up in bed Abdomen: ileostomy. Foley catheter in place  Right perirectal wound clean  Lab Results Lab Results  Component Value Date   WBC 10.7* 10/04/2014   HGB 8.6* 10/04/2014   HCT 26.5* 10/04/2014   MCV 93.0 10/04/2014   PLT 166 10/04/2014    Lab Results  Component Value Date   CREATININE 0.64 10/04/2014   BUN 7 10/04/2014   NA 129* 10/04/2014   K 3.8 10/04/2014   CL 102 10/04/2014   CO2 20* 10/04/2014    Lab Results  Component Value Date   ALT 33 09/29/2014   AST 36 09/29/2014   ALKPHOS 105 09/29/2014   BILITOT 0.6 09/29/2014     Microbiology: Recent Results (from the past 240 hour(s))  Urine culture     Status: None   Collection Time: 09/29/14  1:30 PM  Result Value Ref Range Status   Specimen Description URINE, RANDOM  Final   Special Requests NONE  Final   Culture 10,000 COLONIES/mL KLEBSIELLA PNEUMONIAE  Final   Report Status 10/02/2014 FINAL  Final   Organism ID, Bacteria KLEBSIELLA PNEUMONIAE  Final      Susceptibility   Klebsiella pneumoniae - MIC*    AMPICILLIN >=32 RESISTANT Resistant     CEFAZOLIN <=4 SENSITIVE Sensitive     CEFTRIAXONE <=1 SENSITIVE  Sensitive     CIPROFLOXACIN <=0.25 SENSITIVE Sensitive     GENTAMICIN <=1 SENSITIVE Sensitive     IMIPENEM <=0.25 SENSITIVE Sensitive     NITROFURANTOIN 64 INTERMEDIATE Intermediate     TRIMETH/SULFA <=20 SENSITIVE Sensitive     AMPICILLIN/SULBACTAM >=32 RESISTANT Resistant     PIP/TAZO <=4 SENSITIVE Sensitive     * 10,000 COLONIES/mL KLEBSIELLA PNEUMONIAE  MRSA PCR Screening     Status: None   Collection Time: 09/29/14  4:16 PM  Result Value Ref Range Status   MRSA by PCR NEGATIVE NEGATIVE Final    Comment:        The GeneXpert MRSA Assay (FDA approved for NASAL specimens only), is one component of a comprehensive MRSA colonization surveillance program. It is not intended to diagnose MRSA infection nor to guide or monitor treatment for MRSA infections.    CT ABDOMEN AND PELVIS WITH CONTRAST 09/29/2014  IMPRESSION: Atherosclerosis of abdominal aorta without aneurysm formation.  Ostomy is seen in right lower quadrant. No evidence of bowel obstruction or abscess is noted.  No acute abnormality is noted in the abdomen or pelvis.  Electronically Signed  By: Marijo Conception, M.D.  On: 09/30/2014 09:00  I was asked by Azucena Freed to give a recommendation about duration of antibiotic therapy. Ms. [name] has been on therapy for her right perirectal abscess related to her inflammatory bowel disease and mucous fistula. She had a large right buttock abscess noted on CT a little over one month ago. My partner, Dr. Baxter Flattery, recommended completion of therapy with several more weeks of oral amoxicillin clavulanate and fluconazole. She was recently readmitted with rectal bleeding. Repeat CT scan showed resolution of the abscess. She's not had any fever. A urine culture was done and grew pseudomonas. Amoxicillin clavulanate was changed to piperacillin tazobactam. I strongly suspect that this is asymptomatic bacteria related to her indwelling Foley catheter and that it does not need to  be treated. I will stop piperacillin tazobactam and fluconazole now and recommend observation off of antibiotics. Please call if I can be of further assistance.   Michel Bickers, MD [facility] [phone] pager   [phone] cell 10/05/2014, 2:07 PM

## 2014-10-06 LAB — CBC
HCT: 28.7 % — ABNORMAL LOW (ref 36.0–46.0)
Hemoglobin: 9.1 g/dL — ABNORMAL LOW (ref 12.0–15.0)
MCH: 29.4 pg (ref 26.0–34.0)
MCHC: 31.7 g/dL (ref 30.0–36.0)
MCV: 92.6 fL (ref 78.0–100.0)
PLATELETS: 200 10*3/uL (ref 150–400)
RBC: 3.1 MIL/uL — AB (ref 3.87–5.11)
RDW: 17.1 % — AB (ref 11.5–15.5)
WBC: 10.3 10*3/uL (ref 4.0–10.5)

## 2014-10-06 LAB — GLUCOSE, CAPILLARY
GLUCOSE-CAPILLARY: 81 mg/dL (ref 65–99)
GLUCOSE-CAPILLARY: 144 mg/dL — AB (ref 65–99)
GLUCOSE-CAPILLARY: 109 mg/dL — AB (ref 65–99)

## 2014-10-06 NOTE — Progress Notes (Signed)
[facility] PROGRESS NOTE  [name] ZOX:[id number] DOB: 07-Sep-1940 DOA: 09/29/2014 PCP: Kandice Hams, MD  Assessment/Plan: 1. Rectal bleeding- Gi on board. Recommending continuing mesalamine and tapering prednisone through its discontinuation on 8/25. 2. ? UTI- urine culture is growing 10,000 colonies of  Klebsiella pneumoniae currently felt to be colonization by ID. Will observe off of antibiotics 3. Perianal wound- status post incision and drainage, packed with gauze.Abscess resolved on CT scan. Currently monitoring off of antibiotics.  4. Chest pain- resolved, patient had reproducible chest pain,  EKG showed sinus rhythm,  Trop q 6 hr x3 is negative. 5. Lactic acidosis- resolved, repeat Lactic acid 1.34 6. Hypertension- given soft blood pressures will discontinue antihypertensives 7. Anxiety- continue Xanax when necessary and Zoloft 8. Hyponatremia- stable 9. 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 [name] has unfortunately had 2 prolonged hospital stays recently. On June 20th she was hospitalized with diarrhea and found to have ulcerative colitis and a perirectal abscess. She had a large GI bleed. She also developed bilateral DVTs. She was unable to take anticoagulation therefore an IVC filter was placed. 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 At the end of her 2 hospitalizations she was discharged to skilled nursing facility on 8/4. She remains on Remicade, prednisone, and Augmentin.  Pt has no  new complaints. No acute issues overnight. Discussed update with daughter.  Objective: Filed Vitals:   10/06/14 0857  BP: 114/56  Pulse: 107  Temp: 98.5 F (36.9 C)  Resp: 16    Intake/Output Summary (Last 24 hours) at 10/06/14 1636 Last data filed at 10/06/14 1052  Gross per 24 hour  Intake 3564.17 ml  Output   1050 ml  Net 2514.17 ml   Filed Weights   10/03/14 2036 10/04/14 2026 10/05/14 2023  Weight: 98.5 kg (217 lb 2.5 oz) 98.8 kg (217 lb 13 oz) 99.016 kg (218 lb 4.7 oz)    Exam:   General:  Appears in no acute distress  Cardiovascular: S1-S2 regular  Respiratory: Clear to auscultation bilaterally, no wheezes  Chest wall- Right side chest wall tenderness to palpation  Abdomen: Soft, non-tender, ostomy in place  Musculoskeletal: No edema of the lower extremities  Data Reviewed: Basic Metabolic Panel:  Recent Labs Lab 09/30/14 0427 10/01/14 1310 10/04/14 0447  NA 131* 130* 129*  K 4.5 4.8 3.8  CL 105 103 102  CO2 19* 20* 20*  GLUCOSE 137* 124* 86  BUN 5* 9 7  CREATININE 0.51 0.62 0.64  CALCIUM 8.1* 8.4* 8.0*   Liver Function Tests: No results for input(s): AST, ALT, ALKPHOS, BILITOT, PROT, ALBUMIN in the last 168 hours.   Recent Labs Lab 10/01/14 1310 10/02/14 0520 10/03/14 0544 10/04/14 0906 10/06/14 1222  WBC 13.3* 8.8 9.8 10.7* 10.3  HGB 9.3* 8.9* 9.0* 8.6* 9.1*  HCT 29.2* 27.9* 27.7* 26.5* 28.7*  MCV 93.6 93.3 93.9 93.0 92.6  PLT 255 233 194 166 200   Cardiac Enzymes:  Recent Labs Lab 10/02/14 1411 10/02/14 1848 10/02/14 2352  TROPONINI <0.03 <0.03 <0.03    CBG:  Recent Labs Lab  10/05/14 1704 10/05/14 2201 10/06/14 0751 10/06/14 1136 10/06/14 1617  GLUCAP 119* 112* 81 144* 109*    Recent Results (from the past 240 hour(s))  Urine culture     Status: None   Collection Time: 09/29/14  1:30 PM  Result Value Ref Range Status   Specimen Description URINE, RANDOM  Final   Special Requests NONE  Final   Culture 10,000  COLONIES/mL KLEBSIELLA PNEUMONIAE  Final   Report Status 10/02/2014 FINAL  Final   Organism ID, Bacteria KLEBSIELLA PNEUMONIAE  Final      Susceptibility   Klebsiella pneumoniae - MIC*    AMPICILLIN >=32 RESISTANT Resistant     CEFAZOLIN <=4 SENSITIVE Sensitive     CEFTRIAXONE <=1 SENSITIVE Sensitive     CIPROFLOXACIN <=0.25 SENSITIVE Sensitive     GENTAMICIN <=1 SENSITIVE Sensitive     IMIPENEM <=0.25 SENSITIVE Sensitive     NITROFURANTOIN 64 INTERMEDIATE Intermediate     TRIMETH/SULFA <=20 SENSITIVE Sensitive     AMPICILLIN/SULBACTAM >=32 RESISTANT Resistant     PIP/TAZO <=4 SENSITIVE Sensitive     * 10,000 COLONIES/mL KLEBSIELLA PNEUMONIAE  MRSA PCR Screening     Status: None   Collection Time: 09/29/14  4:16 PM  Result Value Ref Range Status   MRSA by PCR NEGATIVE NEGATIVE Final    Comment:        The GeneXpert MRSA Assay (FDA approved for NASAL specimens only), is one component of a comprehensive MRSA colonization surveillance program. It is not intended to diagnose MRSA infection nor to guide or monitor treatment for MRSA infections.      Studies: No results found.  Scheduled Meds: . feeding supplement (ENSURE ENLIVE)  237 mL Oral BID WC  . insulin aspart  0-9 Units Subcutaneous 6 times per day  . mesalamine  4.8 g Oral Daily  . multivitamins with iron  1 tablet Oral Daily  . pantoprazole  40 mg Oral Q0600  . predniSONE  1 mg Oral Q breakfast   Followed by  . [START ON 10/09/2014] predniSONE  0.5 mg Oral Q breakfast  . psyllium  1 packet Oral BID  . rosuvastatin  40 mg Oral QHS  . saccharomyces boulardii  250 mg Oral BID  . sertraline  50 mg Oral Daily  . sodium chloride  3 mL Intravenous Q12H   Continuous Infusions: . 0.9 % NaCl with KCl 20 mEq / L 50 mL/hr at 10/06/14 0200    Principal Problem:   Abscess, perirectal s/p I&D 08/23/2014 Active Problems:   Essential hypertension   Hyponatremia   General weakness   Rectal bleeding   Elevated lactic acid  level   Rectal fistula   Malnutrition of moderate degree   Inflammatory bowel disease   Pressure ulcer    Time spent: 25 min    Velvet Bathe  [facility] Pager [phone]. If 7PM-7AM, please contact night-coverage at [email or web address], password [facility] 10/06/2014, 4:36 PM  LOS: 7 days

## 2014-10-06 NOTE — Progress Notes (Signed)
Physical Therapy Treatment Patient Details Name: [name] MRN: [id number] DOB: 06/20/1940 Today's Date: 10/06/2014    History of Present Illness Pt. admitted 09/29/14 from [facility] due to rectal bleeding.  Blood thought to be from rectl bleed vs. sacrl wound.  Ostomy consult reveals pt. has several partial thickness sacral wounds.  Pt also has diverting ileostomy.  PMH inculdes ulcerative colitis, CVA, (B) DVTs, perirectal abscess.     PT Comments    Patient working on trunk control and LE strengthening today.  Making slow progress.  Follow Up Recommendations  SNF;Supervision/Assistance - 24 hour     Equipment Recommendations  None recommended by PT    Recommendations for Other Services       Precautions / Restrictions Precautions Precautions: Fall Precaution Comments: ostomy, sacral wound Restrictions Weight Bearing Restrictions: No    Mobility  Bed Mobility Overal bed mobility: Needs Assistance;+2 for physical assistance Bed Mobility: Rolling;Sidelying to Sit;Sit to Sidelying Rolling: Max assist Sidelying to sit: Max assist;+2 for physical assistance     Sit to sidelying: Mod assist;+2 for physical assistance General bed mobility comments: Verbal cues for technique.  Required assist to move RLE to assist with rolling.  Used bed pad to complete roll onto left side.  Required +2 assist to move to sitting, to assist LE's off of bed and trunk to upright position.  Patient initially required mod - max assist to maintain sitting balance, leaning posteriorly.  Worked on trunk control and bringing trunk forward in sitting.  Patient able to maintain balance with min guard assist after approx 6 minutes.  Patient performed LE exercises in sitting.  Required +2 assist to return to sidelying.  Transfers                    Ambulation/Gait                 Stairs            Wheelchair Mobility    Modified Rankin (Stroke Patients Only)        Balance Overall balance assessment: Needs assistance Sitting-balance support: Bilateral upper extremity supported;Feet supported Sitting balance-Leahy Scale: Poor Sitting balance - Comments: Required mod-max assist initially for balance.  Was able to progress to min guard assist. Postural control: Posterior lean                          Cognition Arousal/Alertness: Awake/alert Behavior During Therapy: WFL for tasks assessed/performed;Anxious (Tearful today) Overall Cognitive Status: History of cognitive impairments - at baseline                      Exercises General Exercises - Lower Extremity Ankle Circles/Pumps: AROM;Both;10 reps;Seated Long Arc Quad: AROM;Both;10 reps;Seated Hip Flexion/Marching: AROM;Both;5 reps;Seated    General Comments        Pertinent Vitals/Pain Pain Assessment: Faces Faces Pain Scale: Hurts even more Pain Location: Sacral area/hips Pain Descriptors / Indicators: Aching;Sore Pain Intervention(s): Monitored during session;Repositioned    Home Living                      Prior Function            PT Goals (current goals can now be found in the care plan section) Progress towards PT goals: Progressing toward goals (Slowly)    Frequency  Min 2X/week    PT Plan Current plan remains appropriate    Co-evaluation  End of Session   Activity Tolerance: Patient limited by pain;Patient limited by fatigue Patient left: in bed;with call bell/phone within reach;with bed alarm set;with family/visitor present     Time: 1201-1221 PT Time Calculation (min) (ACUTE ONLY): 20 min  Charges:  $Therapeutic Activity: 8-22 mins                    G Codes:      Despina Pole 2014-10-09, 12:53 PM Carita Pian. Sanjuana Kava, [place] Pager [phone]

## 2014-10-06 NOTE — Progress Notes (Signed)
Chaplain responded to a consult request. Pt 's family was at the bedside. Pt is in considerable pain. Pt discussed her concern about the medication she was perscribed previously. She wonders if it caused her problem. Chaplain listened empathically and asked RN to assure Pt she is getting proper medication. Chaplain will continue to follow.    10/06/14 1300  Clinical Encounter Type  Visited With Patient  Visit Type Spiritual support  Referral From Family  Spiritual Encounters  Spiritual Needs Emotional;Prayer

## 2014-10-06 NOTE — Clinical Social Work Note (Signed)
CSW continuing to follow patient's progress and will assist with discharge back to [facility] when medically stable. Clinical information transmitted to [facility] for their review.  Jenesis Suchy Givens, MSW, LCSW Licensed Clinical Social Worker [place] [phone]

## 2014-10-07 LAB — CBC
HEMATOCRIT: 26.4 % — AB (ref 36.0–46.0)
Hemoglobin: 8.4 g/dL — ABNORMAL LOW (ref 12.0–15.0)
MCH: 29.4 pg (ref 26.0–34.0)
MCHC: 31.8 g/dL (ref 30.0–36.0)
MCV: 92.3 fL (ref 78.0–100.0)
PLATELETS: 180 10*3/uL (ref 150–400)
RBC: 2.86 MIL/uL — AB (ref 3.87–5.11)
RDW: 17.1 % — ABNORMAL HIGH (ref 11.5–15.5)
WBC: 8.8 10*3/uL (ref 4.0–10.5)

## 2014-10-07 LAB — GLUCOSE, CAPILLARY
Glucose-Capillary: 89 mg/dL (ref 65–99)
Glucose-Capillary: 114 mg/dL — ABNORMAL HIGH (ref 65–99)

## 2014-10-07 MED ORDER — ALPRAZOLAM 0.5 MG PO TABS: 0.5000 mg | ORAL_TABLET | Freq: Two times a day (BID) | ORAL | Status: DC | PRN

## 2014-10-07 MED ORDER — OXYCODONE HCL 5 MG PO TABS: 5.0000 mg | ORAL_TABLET | ORAL | Status: DC | PRN

## 2014-10-07 NOTE — Progress Notes (Signed)
CSW has left message with [facility] reviewer re: authorization for pt to return to NH.  Expect approval and d/c today.

## 2014-10-07 NOTE — Clinical Social Work Placement (Signed)
   CLINICAL SOCIAL WORK PLACEMENT  NOTE  Date:  10/07/2014  Patient Details  Name: [name] MRN: [id number] Date of Birth: March 28, 1940  Clinical Social Work is seeking post-discharge placement for this patient at the [place] level of care (*CSW will initial, date and re-position this form in  chart as items are completed):  Yes   Patient/family provided with [facility] Work Department's list of facilities offering this level of care within the geographic area requested by the patient (or if unable, by the patient's family).  Yes   Patient/family informed of their freedom to choose among providers that offer the needed level of care, that participate in Medicare, Medicaid or managed care program needed by the patient, have an available bed and are willing to accept the patient.  No   Patient/family informed of [facility]'s ownership interest in [facility] and [facility], as well as of the fact that they are under no obligation to receive care at these facilities.  PASRR submitted to EDS on       PASRR number received on       Existing PASRR number confirmed on 10/02/14     FL2 transmitted to all facilities in geographic area requested by pt/family on 10/02/14     FL2 transmitted to all facilities within larger geographic area on       Patient informed that his/her managed care company has contracts with or will negotiate with certain facilities, including the following:        Yes (CSW talked with patient and daughter Ramona by phone and daughter Ava at the hospital..)   Patient/family informed of bed offers received.  Patient chooses bed at     [facility]  Physician recommends and patient chooses bed at      Patient to be transferred to   on  .10/07/2014 to [facility]  Patient to be transferred to facility by    [facility]   Patient family notified on   of transfer. At bedside on 10/07/14  Name of family member notified:       daughter  PHYSICIAN       Additional Comment:    _______________________________________________ [name] Raider, LCSW 10/07/2014, 2:35 PM

## 2014-10-07 NOTE — Discharge Summary (Signed)
Physician Discharge Summary  [name] SPQ:[id number] DOB: 12/20/1940 DOA: 09/29/2014  PCP: Kandice Hams, MD  Admit date: 09/29/2014 Discharge date: 10/07/2014  Time spent: > 35 minutes  Recommendations for Outpatient Follow-up:  1. Monitor cbc within the next 1 week 2. Pt to continue physical therapy while at SNF 3. Pt to continue routine wound care at facility. Should any questions arise about wound care please contact general surgeon Dr. Barry Dienes or Dr. Ok Anis  Discharge Diagnoses:  Principal Problem:   Abscess, perirectal s/p I&D 08/23/2014 Active Problems:   Essential hypertension   Hyponatremia   General weakness   Rectal bleeding   Elevated lactic acid level   Rectal fistula   Malnutrition of moderate degree   Inflammatory bowel disease   Pressure ulcer   Discharge Condition: stable  Diet recommendation: soft diet  Filed Weights   10/04/14 2026 10/05/14 2023 10/06/14 2055  Weight: 98.8 kg (217 lb 13 oz) 99.016 kg (218 lb 4.7 oz) 96.208 kg (212 lb 1.6 oz)    History of present illness:  From original HPI: 74 y.o. 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 [name] has unfortunately had 2 prolonged hospital stays recently. On June 20th she was hospitalized with diarrhea and found to have ulcerative colitis and a perirectal abscess. She had a large GI bleed. She also developed bilateral DVTs. She was unable to take anticoagulation therefore an IVC filter was placed. 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 At the end of her 2 hospitalizations she was discharged to skilled nursing facility on 8/4.  Hospital Course:  1. Rectal bleeding- Gi on board. Recommending continuing mesalamine and tapering prednisone through its  discontinuation on 8/25. Recommend reassessing cbc within 1 week. 2. UTI- urine culture is growing 10,000 colonies of Klebsiella pneumoniae currently felt to be colonization by ID. Was observed off of antibiotics and WBC continued to trend down with no fevers.  3. Perianal wound- status post incision and drainage, packed with gauze. Abscess resolved on CT scan. Currently monitoring off of antibiotics.  4. Chest pain- resolved, patient had reproducible chest pain, EKG showed sinus rhythm, Trop q 6 hr x3 is negative. 5. Lactic acidosis- resolved, repeat Lactic acid 1.34 6. Hypertension- given soft blood pressures will discontinued antihypertensives 7. Anxiety- continue Xanax when necessary and Zoloft 8. Hyponatremia- stable  Procedures:  None  Consultations:  GI  ID  Discharge Exam: Filed Vitals:   10/07/14 0807  BP: 109/58  Pulse: 106  Temp: 98.1 F (36.7 C)  Resp: 18    General: Pt in nad, alert and awake Cardiovascular: no cyanosis Respiratory: no increased wob, no wheezes, equal chest rise.  Discharge Instructions   Discharge Instructions    AMB Referral to [facility]    Complete by:  As directed   Please assign to [facility] LCSW for follow up at [facility] Likely [facility]. [facility] referral. Follow up for possible becoming long term care at SNF. May need assist with LTC process or for transition when eventually going home. Consents were signed. Please call with questions. Marthenia Rolling, [place], [facility] Liaison-[phone]  Reason for consult:  Please assign to [facility] LCSW for follow up at SNF  Expected date of contact:  1-3 days (reserved for hospital discharges)  Diet - low sodium heart healthy    Complete by:  As directed      Increase activity slowly    Complete by:  As directed           Current Discharge Medication List    CONTINUE these medications which have CHANGED   Details  oxyCODONE (OXY IR/ROXICODONE) 5 MG immediate  release tablet Take 1 tablet (5 mg total) by mouth every 4 (four) hours as needed for moderate pain, severe pain or breakthrough pain. Qty: 30 tablet, Refills: 0      CONTINUE these medications which have NOT CHANGED   Details  acetaminophen (TYLENOL) 500 MG tablet Take 2 tablets (1,000 mg total) by mouth every 4 (four) hours as needed for mild pain or moderate pain. Qty: 30 tablet, Refills: 0    ALPRAZolam (XANAX) 0.5 MG tablet Take 1 tablet (0.5 mg total) by mouth 2 (two) times daily as needed for anxiety. Qty: 20 tablet, Refills: 0    CRESTOR 40 MG tablet TAKE 1 TABLET AT BEDTIME Qty: 90 tablet, Refills: 2    feeding supplement (BOOST / RESOURCE BREEZE) LIQD Take 1 Container by mouth 3 (three) times daily between meals. Refills: 0    mesalamine (LIALDA) 1.2 G EC tablet take 4 tablets by mouth once daily Qty: 120 tablet, Refills: 2    psyllium (HYDROCIL/METAMUCIL) 95 % PACK Take 1 packet by mouth 2 (two) times daily. Qty: 56 each, Refills: 0    saccharomyces boulardii (FLORASTOR) 250 MG capsule Take 1 capsule (250 mg total) by mouth 2 (two) times daily. Qty: 60 capsule, Refills: 0    !! predniSONE (DELTASONE) 1 MG tablet Take 1 tablet (1 mg total) by mouth daily with breakfast. For 3 doses    !! predniSONE (DELTASONE) 1 MG tablet Take 0.5 tablets (0.5 mg total) by mouth daily with breakfast. For 3 doses    sertraline (ZOLOFT) 50 MG tablet Take 1 tablet (50 mg total) by mouth daily. Qty: 30 tablet, Refills: 3     !! - Potential duplicate medications found. Please discuss with provider.    STOP taking these medications     amLODipine (NORVASC) 10 MG tablet      amoxicillin-clavulanate (AUGMENTIN) 875-125 MG per tablet      fluconazole (DIFLUCAN) 200 MG tablet      metoprolol tartrate (LOPRESSOR) 25 MG tablet      oxyCODONE-acetaminophen (PERCOCET/ROXICET) 5-325 MG per tablet        Allergies  Allergen Reactions  . Clindamycin/Lincomycin Diarrhea    Leads to  colitis flare  . Ivp Dye [Iodinated Diagnostic Agents] Other (See Comments)    "almost passed out"   Follow-up Information    Follow up with [facility], MD On 10/09/2014.   Specialty:  General Surgery   Why:  11:15am, arrive no later than 10:50am to fill out paperwork.  Surgical follow up    Contact information:   [address] [place] [address] [phone]        The results of significant diagnostics from this hospitalization (including imaging, microbiology, ancillary and laboratory) are listed below for reference.    Significant Diagnostic Studies: Ct Abdomen Pelvis W Contrast  09/30/2014   CLINICAL DATA:  Rectal bleeding and history of recent rectal abscess.  EXAM: CT ABDOMEN AND PELVIS WITH CONTRAST  TECHNIQUE: Multidetector CT imaging of the abdomen and pelvis was performed using the standard protocol following bolus administration of intravenous contrast.  CONTRAST:  16mL OMNIPAQUE IOHEXOL  300 MG/ML  SOLN  COMPARISON:  CT scan of August 22, 2014.  FINDINGS: Visualized lung bases are unremarkable. No significant osseous abnormality is noted.  Status post cholecystectomy. Probable focal fatty infiltration is seen involving the inferior and anterior portion of the right hepatic lobe. The spleen and pancreas appear normal. Atherosclerosis of abdominal aorta is noted without aneurysm formation. IVC filter is seen in infrarenal position. Adrenal glands appear normal. No hydronephrosis or renal obstruction is noted. Simple left renal cyst is noted. Bilateral cortical scarring is seen involving the kidneys. The appendix appears normal. Ostomy is seen in the right lower quadrant. No evidence of bowel obstruction is noted. No abnormal fluid collection is noted. Urinary bladder is decompressed secondary to Foley catheter. Status post hysterectomy. Ovaries appear normal. No significant adenopathy is noted.  IMPRESSION: Atherosclerosis of abdominal aorta without aneurysm formation.   Ostomy is seen in right lower quadrant. No evidence of bowel obstruction or abscess is noted.  No acute abnormality is noted in the abdomen or pelvis.   Electronically Signed   By: Marijo Conception, M.D.   On: 09/30/2014 09:00    Microbiology: Recent Results (from the past 240 hour(s))  Urine culture     Status: None   Collection Time: 09/29/14  1:30 PM  Result Value Ref Range Status   Specimen Description URINE, RANDOM  Final   Special Requests NONE  Final   Culture 10,000 COLONIES/mL KLEBSIELLA PNEUMONIAE  Final   Report Status 10/02/2014 FINAL  Final   Organism ID, Bacteria KLEBSIELLA PNEUMONIAE  Final      Susceptibility   Klebsiella pneumoniae - MIC*    AMPICILLIN >=32 RESISTANT Resistant     CEFAZOLIN <=4 SENSITIVE Sensitive     CEFTRIAXONE <=1 SENSITIVE Sensitive     CIPROFLOXACIN <=0.25 SENSITIVE Sensitive     GENTAMICIN <=1 SENSITIVE Sensitive     IMIPENEM <=0.25 SENSITIVE Sensitive     NITROFURANTOIN 64 INTERMEDIATE Intermediate     TRIMETH/SULFA <=20 SENSITIVE Sensitive     AMPICILLIN/SULBACTAM >=32 RESISTANT Resistant     PIP/TAZO <=4 SENSITIVE Sensitive     * 10,000 COLONIES/mL KLEBSIELLA PNEUMONIAE  MRSA PCR Screening     Status: None   Collection Time: 09/29/14  4:16 PM  Result Value Ref Range Status   MRSA by PCR NEGATIVE NEGATIVE Final    Comment:        The GeneXpert MRSA Assay (FDA approved for NASAL specimens only), is one component of a comprehensive MRSA colonization surveillance program. It is not intended to diagnose MRSA infection nor to guide or monitor treatment for MRSA infections.      Labs: Basic Metabolic Panel:  Recent Labs Lab 10/01/14 1310 10/04/14 0447  NA 130* 129*  K 4.8 3.8  CL 103 102  CO2 20* 20*  GLUCOSE 124* 86  BUN 9 7  CREATININE 0.62 0.64  CALCIUM 8.4* 8.0*   Liver Function Tests: No results for input(s): AST, ALT, ALKPHOS, BILITOT, PROT, ALBUMIN in the last 168 hours. No results for input(s): LIPASE, AMYLASE in  the last 168 hours. No results for input(s): AMMONIA in the last 168 hours. CBC:  Recent Labs Lab 10/02/14 0520 10/03/14 0544 10/04/14 0906 10/06/14 1222 10/07/14 0444  WBC 8.8 9.8 10.7* 10.3 8.8  HGB 8.9* 9.0* 8.6* 9.1* 8.4*  HCT 27.9* 27.7* 26.5* 28.7* 26.4*  MCV 93.3 93.9 93.0 92.6 92.3  PLT 233 194 166 200 180   Cardiac Enzymes:  Recent Labs Lab 10/02/14 1411 10/02/14  1848 10/02/14 2352  TROPONINI <0.03 <0.03 <0.03   BNP: BNP (last 3 results) No results for input(s): BNP in the last 8760 hours.  ProBNP (last 3 results) No results for input(s): PROBNP in the last 8760 hours.  CBG:  Recent Labs Lab 10/05/14 2201 10/06/14 0751 10/06/14 1136 10/06/14 1617 10/07/14 0753  GLUCAP 112* 81 144* 109* 89     Signed:  Nialah Saravia  [facility] 10/07/2014, 11:00 AM

## 2014-10-07 NOTE — Progress Notes (Signed)
Patient Discharge:  Disposition: Pt discharged to [facility]  Education: Report given to charge nurse at [facility]; discharge summary sent   IV: Removed  Telemetry: Removed CCMD notified  Follow-up appointments: N/A  Prescriptions: Sent with pt  Transportation: Transported via ambulance   Belongings:All belongings taken with pt

## 2014-10-09 ENCOUNTER — Ambulatory Visit: Payer: BC Managed Care – PPO | Admitting: Gastroenterology

## 2014-10-09 NOTE — Consult Note (Signed)
Referral was assign to [facility] LCSW on 10/09/2014.

## 2014-10-10 ENCOUNTER — Inpatient Hospital Stay (HOSPITAL_COMMUNITY)
Admission: EM | Admit: 2014-10-10 | Discharge: 2014-10-17 | DRG: 385 | Disposition: A | Discharge status: Skilled Nursing Facility | Payer: Commercial Managed Care - HMO | Attending: Internal Medicine | Admitting: Internal Medicine

## 2014-10-10 ENCOUNTER — Encounter (HOSPITAL_COMMUNITY): Payer: Self-pay | Admitting: *Deleted

## 2014-10-10 ENCOUNTER — Emergency Department (HOSPITAL_COMMUNITY): Payer: Commercial Managed Care - HMO

## 2014-10-10 DIAGNOSIS — E869 Volume depletion, unspecified: Secondary | ICD-10-CM | POA: Diagnosis present

## 2014-10-10 DIAGNOSIS — F32A Depression, unspecified: Secondary | ICD-10-CM | POA: Diagnosis present

## 2014-10-10 DIAGNOSIS — I252 Old myocardial infarction: Secondary | ICD-10-CM | POA: Diagnosis not present

## 2014-10-10 DIAGNOSIS — Z87891 Personal history of nicotine dependence: Secondary | ICD-10-CM | POA: Diagnosis not present

## 2014-10-10 DIAGNOSIS — Z951 Presence of aortocoronary bypass graft: Secondary | ICD-10-CM

## 2014-10-10 DIAGNOSIS — M79606 Pain in leg, unspecified: Secondary | ICD-10-CM | POA: Diagnosis not present

## 2014-10-10 DIAGNOSIS — E119 Type 2 diabetes mellitus without complications: Secondary | ICD-10-CM | POA: Diagnosis present

## 2014-10-10 DIAGNOSIS — E871 Hypo-osmolality and hyponatremia: Secondary | ICD-10-CM | POA: Diagnosis not present

## 2014-10-10 DIAGNOSIS — Z79899 Other long term (current) drug therapy: Secondary | ICD-10-CM | POA: Diagnosis not present

## 2014-10-10 DIAGNOSIS — K625 Hemorrhage of anus and rectum: Secondary | ICD-10-CM | POA: Diagnosis not present

## 2014-10-10 DIAGNOSIS — G4700 Insomnia, unspecified: Secondary | ICD-10-CM | POA: Diagnosis present

## 2014-10-10 DIAGNOSIS — R1084 Generalized abdominal pain: Secondary | ICD-10-CM | POA: Diagnosis not present

## 2014-10-10 DIAGNOSIS — I10 Essential (primary) hypertension: Secondary | ICD-10-CM | POA: Diagnosis present

## 2014-10-10 DIAGNOSIS — Z6829 Body mass index (BMI) 29.0-29.9, adult: Secondary | ICD-10-CM

## 2014-10-10 DIAGNOSIS — N390 Urinary tract infection, site not specified: Secondary | ICD-10-CM | POA: Diagnosis present

## 2014-10-10 DIAGNOSIS — Z932 Ileostomy status: Secondary | ICD-10-CM | POA: Diagnosis not present

## 2014-10-10 DIAGNOSIS — E782 Mixed hyperlipidemia: Secondary | ICD-10-CM | POA: Diagnosis present

## 2014-10-10 DIAGNOSIS — R609 Edema, unspecified: Secondary | ICD-10-CM | POA: Diagnosis not present

## 2014-10-10 DIAGNOSIS — E8809 Other disorders of plasma-protein metabolism, not elsewhere classified: Secondary | ICD-10-CM | POA: Diagnosis present

## 2014-10-10 DIAGNOSIS — K613 Ischiorectal abscess: Secondary | ICD-10-CM | POA: Diagnosis present

## 2014-10-10 DIAGNOSIS — K51911 Ulcerative colitis, unspecified with rectal bleeding: Secondary | ICD-10-CM | POA: Diagnosis present

## 2014-10-10 DIAGNOSIS — Z86010 Personal history of colonic polyps: Secondary | ICD-10-CM

## 2014-10-10 DIAGNOSIS — D62 Acute posthemorrhagic anemia: Secondary | ICD-10-CM | POA: Diagnosis not present

## 2014-10-10 DIAGNOSIS — I2510 Atherosclerotic heart disease of native coronary artery without angina pectoris: Secondary | ICD-10-CM | POA: Diagnosis present

## 2014-10-10 DIAGNOSIS — K5100 Ulcerative (chronic) pancolitis without complications: Secondary | ICD-10-CM | POA: Diagnosis present

## 2014-10-10 DIAGNOSIS — Z8673 Personal history of transient ischemic attack (TIA), and cerebral infarction without residual deficits: Secondary | ICD-10-CM | POA: Diagnosis not present

## 2014-10-10 DIAGNOSIS — Z90710 Acquired absence of both cervix and uterus: Secondary | ICD-10-CM | POA: Diagnosis not present

## 2014-10-10 DIAGNOSIS — R69 Illness, unspecified: Secondary | ICD-10-CM

## 2014-10-10 DIAGNOSIS — M1990 Unspecified osteoarthritis, unspecified site: Secondary | ICD-10-CM | POA: Diagnosis present

## 2014-10-10 DIAGNOSIS — K51919 Ulcerative colitis, unspecified with unspecified complications: Secondary | ICD-10-CM

## 2014-10-10 DIAGNOSIS — K611 Rectal abscess: Secondary | ICD-10-CM | POA: Diagnosis not present

## 2014-10-10 DIAGNOSIS — D649 Anemia, unspecified: Secondary | ICD-10-CM | POA: Diagnosis present

## 2014-10-10 DIAGNOSIS — F329 Major depressive disorder, single episode, unspecified: Secondary | ICD-10-CM | POA: Diagnosis not present

## 2014-10-10 DIAGNOSIS — R935 Abnormal findings on diagnostic imaging of other abdominal regions, including retroperitoneum: Secondary | ICD-10-CM

## 2014-10-10 DIAGNOSIS — F419 Anxiety disorder, unspecified: Secondary | ICD-10-CM | POA: Diagnosis present

## 2014-10-10 DIAGNOSIS — E43 Unspecified severe protein-calorie malnutrition: Secondary | ICD-10-CM | POA: Diagnosis present

## 2014-10-10 DIAGNOSIS — I679 Cerebrovascular disease, unspecified: Secondary | ICD-10-CM | POA: Diagnosis present

## 2014-10-10 DIAGNOSIS — K51914 Ulcerative colitis, unspecified with abscess: Secondary | ICD-10-CM | POA: Diagnosis not present

## 2014-10-10 DIAGNOSIS — K219 Gastro-esophageal reflux disease without esophagitis: Secondary | ICD-10-CM | POA: Diagnosis present

## 2014-10-10 HISTORY — DX: Major depressive disorder, single episode, unspecified: F32.9

## 2014-10-10 HISTORY — DX: Acute embolism and thrombosis of unspecified deep veins of unspecified lower extremity: I82.409

## 2014-10-10 LAB — COMPREHENSIVE METABOLIC PANEL
ALK PHOS: 86 U/L (ref 38–126)
ALT: 31 U/L (ref 14–54)
ANION GAP: 8 (ref 5–15)
AST: 37 U/L (ref 15–41)
Albumin: 2.2 g/dL — ABNORMAL LOW (ref 3.5–5.0)
BILIRUBIN TOTAL: 0.7 mg/dL (ref 0.3–1.2)
BUN: <5 mg/dL — ABNORMAL LOW (ref 6–20)
CALCIUM: 8.4 mg/dL — AB (ref 8.9–10.3)
CO2: 24 mmol/L (ref 22–32)
CREATININE: 0.49 mg/dL (ref 0.44–1.00)
Chloride: 101 mmol/L (ref 101–111)
Glucose, Bld: 105 mg/dL — ABNORMAL HIGH (ref 65–99)
Potassium: 3.5 mmol/L (ref 3.5–5.1)
Sodium: 133 mmol/L — ABNORMAL LOW (ref 135–145)
TOTAL PROTEIN: 5.5 g/dL — AB (ref 6.5–8.1)

## 2014-10-10 LAB — CBC WITH DIFFERENTIAL/PLATELET
Basophils Absolute: 0 10*3/uL (ref 0.0–0.1)
Basophils Relative: 0 % (ref 0–1)
EOS ABS: 0 10*3/uL (ref 0.0–0.7)
Eosinophils Relative: 0 % (ref 0–5)
HEMATOCRIT: 29.7 % — AB (ref 36.0–46.0)
HEMOGLOBIN: 9.5 g/dL — AB (ref 12.0–15.0)
LYMPHS ABS: 1.9 10*3/uL (ref 0.7–4.0)
LYMPHS PCT: 17 % (ref 12–46)
MCH: 29.4 pg (ref 26.0–34.0)
MCHC: 32 g/dL (ref 30.0–36.0)
MCV: 92 fL (ref 78.0–100.0)
MONOS PCT: 6 % (ref 3–12)
Monocytes Absolute: 0.7 10*3/uL (ref 0.1–1.0)
NEUTROS ABS: 8.9 10*3/uL — AB (ref 1.7–7.7)
NEUTROS PCT: 77 % (ref 43–77)
Platelets: 218 10*3/uL (ref 150–400)
RBC: 3.23 MIL/uL — AB (ref 3.87–5.11)
RDW: 16.7 % — ABNORMAL HIGH (ref 11.5–15.5)
WBC: 11.5 10*3/uL — AB (ref 4.0–10.5)

## 2014-10-10 LAB — URINALYSIS, ROUTINE W REFLEX MICROSCOPIC
BILIRUBIN URINE: NEGATIVE
Glucose, UA: NEGATIVE mg/dL
HGB URINE DIPSTICK: NEGATIVE
Ketones, ur: 15 mg/dL — AB
Leukocytes, UA: NEGATIVE
Nitrite: POSITIVE — AB
PH: 5.5 (ref 5.0–8.0)
Protein, ur: 30 mg/dL — AB
SPECIFIC GRAVITY, URINE: 1.025 (ref 1.005–1.030)
UROBILINOGEN UA: 0.2 mg/dL (ref 0.0–1.0)

## 2014-10-10 LAB — PROTIME-INR
INR: 1.13 (ref 0.00–1.49)
Prothrombin Time: 14.7 seconds (ref 11.6–15.2)

## 2014-10-10 LAB — I-STAT CG4 LACTIC ACID, ED: Lactic Acid, Venous: 1.16 mmol/L (ref 0.5–2.0)

## 2014-10-10 LAB — URINE MICROSCOPIC-ADD ON

## 2014-10-10 LAB — LIPASE, BLOOD: LIPASE: 27 U/L (ref 22–51)

## 2014-10-10 LAB — SURGICAL PCR SCREEN
MRSA, PCR: NEGATIVE
STAPHYLOCOCCUS AUREUS: NEGATIVE

## 2014-10-10 LAB — APTT: APTT: 29 s (ref 24–37)

## 2014-10-10 MED ORDER — OXYCODONE HCL 5 MG PO TABS
5.0000 mg | ORAL_TABLET | ORAL | Status: DC | PRN
  Administered 2014-10-10 – 2014-10-17 (×14): 5 mg via ORAL
  Filled 2014-10-10 (×14): qty 1

## 2014-10-10 MED ORDER — FENTANYL CITRATE (PF) 100 MCG/2ML IJ SOLN
50.0000 ug | Freq: Once | INTRAMUSCULAR | Status: AC
  Administered 2014-10-10: 50 ug via INTRAVENOUS
  Filled 2014-10-10: qty 2

## 2014-10-10 MED ORDER — SODIUM CHLORIDE 0.9 % IV BOLUS (SEPSIS)
500.0000 mL | Freq: Once | INTRAVENOUS | Status: AC
  Administered 2014-10-10: 500 mL via INTRAVENOUS

## 2014-10-10 MED ORDER — ACETAMINOPHEN 325 MG PO TABS
650.0000 mg | ORAL_TABLET | Freq: Four times a day (QID) | ORAL | Status: DC | PRN
  Administered 2014-10-12 – 2014-10-13 (×3): 650 mg via ORAL
  Filled 2014-10-10 (×3): qty 2

## 2014-10-10 MED ORDER — MORPHINE SULFATE (PF) 4 MG/ML IV SOLN
4.0000 mg | Freq: Once | INTRAVENOUS | Status: AC
  Administered 2014-10-10: 4 mg via INTRAVENOUS
  Filled 2014-10-10: qty 1

## 2014-10-10 MED ORDER — SACCHAROMYCES BOULARDII 250 MG PO CAPS
250.0000 mg | ORAL_CAPSULE | Freq: Two times a day (BID) | ORAL | Status: DC
  Administered 2014-10-10 – 2014-10-17 (×13): 250 mg via ORAL
  Filled 2014-10-10 (×13): qty 1

## 2014-10-10 MED ORDER — BOOST / RESOURCE BREEZE PO LIQD
1.0000 | Freq: Three times a day (TID) | ORAL | Status: DC
  Administered 2014-10-10 – 2014-10-12 (×2): 1 via ORAL

## 2014-10-10 MED ORDER — DEXTROSE 5 % IV SOLN
1.0000 g | INTRAVENOUS | Status: DC
  Administered 2014-10-10 – 2014-10-13 (×4): 1 g via INTRAVENOUS
  Filled 2014-10-10 (×5): qty 10

## 2014-10-10 MED ORDER — SODIUM CHLORIDE 0.9 % IV SOLN
INTRAVENOUS | Status: DC
  Administered 2014-10-10 – 2014-10-12 (×3): via INTRAVENOUS

## 2014-10-10 MED ORDER — MORPHINE SULFATE (PF) 2 MG/ML IV SOLN
2.0000 mg | INTRAVENOUS | Status: DC | PRN
  Administered 2014-10-10 – 2014-10-17 (×21): 2 mg via INTRAVENOUS
  Filled 2014-10-10 (×22): qty 1

## 2014-10-10 MED ORDER — ONDANSETRON HCL 4 MG/2ML IJ SOLN
4.0000 mg | Freq: Once | INTRAMUSCULAR | Status: AC
  Administered 2014-10-10: 4 mg via INTRAVENOUS
  Filled 2014-10-10: qty 2

## 2014-10-10 MED ORDER — ONDANSETRON HCL 4 MG PO TABS: 4.0000 mg | ORAL_TABLET | Freq: Four times a day (QID) | ORAL | Status: DC | PRN

## 2014-10-10 MED ORDER — ROSUVASTATIN CALCIUM 40 MG PO TABS
40.0000 mg | ORAL_TABLET | Freq: Every day | ORAL | Status: DC
  Administered 2014-10-10 – 2014-10-16 (×7): 40 mg via ORAL
  Filled 2014-10-10 (×9): qty 1

## 2014-10-10 MED ORDER — ACETAMINOPHEN 650 MG RE SUPP: 650.0000 mg | Freq: Four times a day (QID) | RECTAL | Status: DC | PRN

## 2014-10-10 MED ORDER — ALPRAZOLAM 0.5 MG PO TABS
0.5000 mg | ORAL_TABLET | Freq: Two times a day (BID) | ORAL | Status: DC | PRN
  Administered 2014-10-10 – 2014-10-12 (×2): 0.5 mg via ORAL
  Filled 2014-10-10 (×2): qty 1

## 2014-10-10 MED ORDER — ONDANSETRON HCL 4 MG/2ML IJ SOLN: 4.0000 mg | Freq: Four times a day (QID) | INTRAMUSCULAR | Status: DC | PRN

## 2014-10-10 MED ORDER — SERTRALINE HCL 50 MG PO TABS
50.0000 mg | ORAL_TABLET | Freq: Every day | ORAL | Status: DC
Start: 2014-10-10 — End: 2014-10-13
  Administered 2014-10-12 – 2014-10-13 (×2): 50 mg via ORAL
  Filled 2014-10-10 (×2): qty 1

## 2014-10-10 NOTE — H&P (Signed)
[facility] History and Physical  [name] BEAUCHESNE INO:[id number] DOB: May 21, 1940 DOA: 10/10/2014  Referring physician:  PCP: Kandice Hams, MD   Chief Complaint: Lower abdominal pain  HPI: [name] is a 74 y.o. female with a history of ulcerative colitis with multiple hospitalizations over the past several months, having a history of perianal abscess status post debridement of perianal wound with laparoscopic diverticula ileostomy performed on 09/13/2014, recently admitted to the medicine service on 09/29/2014 and discharged on 10/07/2014 at which time she presented with rectal bleeding. During that hospitalization she was seen by GI and underwent colonoscopy on 10/04/2014, found to have mild to moderate mucosal erythema throughout left colon with small amount of old blood. There is no active bleeding seen. She was discharged to skilled nursing facility on prednisone taper. She has been treated with several courses of antimicrobial therapy and steroids over the last several months. Today she was transferred from her skilled nursing facility to the emergency department for further workup of lower abdominal pain associate with episode of bloody stools. Patient remains significantly deconditioned, unable to ambulate, requiring assistance with most activities of daily living. She was worked up with a CT scan of abdomen and pelvis that revealed pancolitis could be consistent with active ulcerative colitis or infectious per radiology. I discussed case with Theodis Aguas of [facility] who will consult.                                                       Review of Systems:  Constitutional:  No weight loss, night sweats, Fevers, chills, positive for ongoing weakness, fatigue, malaise.  HEENT:  No headaches, Difficulty swallowing,Tooth/dental problems,Sore throat,  No sneezing, itching, ear ache, nasal congestion, post nasal drip,  Cardio-vascular:  No chest pain, Orthopnea, PND, swelling in  lower extremities, anasarca, dizziness, palpitations  GI:  No heartburn, indigestion, positive for abdominal pain, denies nausea, vomiting, diarrhea, change in bowel habits, loss of appetite  Resp:  No shortness of breath with exertion or at rest. No excess mucus, no productive cough, No non-productive cough, No coughing up of blood.No change in color of mucus.No wheezing.No chest wall deformity  Skin:  no rash or lesions.  GU:  no dysuria, change in color of urine, no urgency or frequency. No flank pain.  Musculoskeletal:  No joint pain or swelling. No decreased range of motion. No back pain.  Psych:  No change in mood or affect. No depression or anxiety. No memory loss.   Past Medical History  Diagnosis Date  . CAD (coronary artery disease)     unspecified  . Cerebrovascular disease 2010    bil carotid artery disease. left CEA 02/2004.   Marland Kitchen Hyperlipidemia, mixed   . HTN (hypertension)   . Ulcerative colitis 2002  . Colon polyps 2006  . Thyroid disease   . Myocardial infarct 1998  . Carotid artery occlusion 2010  . Diabetes mellitus without complication     Borderline  . GERD (gastroesophageal reflux disease)   . Arthritis   . 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    Past Surgical History  Procedure Laterality Date  . Cholecystectomy  7.06.2008    with cholangiogram  . 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 Abdominal hysterectomy    . Colonoscopy  multiple  . Sigmoidoscopy  multiple  . Flexible sigmoidoscopy N/A 08/08/2014    Procedure: FLEXIBLE SIGMOIDOSCOPY;  Surgeon: Gatha Mayer, MD;  Location: [facility];  Service: Endoscopy;  Laterality: N/A;  . Incision and drainage perirectal abscess N/A 08/22/2014    Procedure: IRRIGATION AND DEBRIDEMENT PERIRECTAL ABSCESS;  Surgeon: Jackolyn Confer, MD;  Location: [facility];  Service: General;  Laterality: N/A;  . Joint replacement  left knee  . Flexible sigmoidoscopy N/A 09/06/2014    Procedure: FLEXIBLE SIGMOIDOSCOPY;   Surgeon: Lafayette Dragon, MD;  Location: [facility];  Service: Endoscopy;  Laterality: N/A;  . Laparoscopic diverted colostomy N/A 09/13/2014    Procedure: LAPAROSCOPIC DIVERTED ILEOSTOMY;  Surgeon: Stark Klein, MD;  Location: [facility];  Service: General;  Laterality: N/A;  . Rectal exam under anesthesia N/A 09/13/2014    Procedure: RECTAL EXAM UNDER ANESTHESIA  AND DEBRIDEMENT OF PERIANAL WOUND;  Surgeon: Stark Klein, MD;  Location: [facility];  Service: General;  Laterality: N/A;  . Flexible sigmoidoscopy N/A 10/04/2014    Procedure: Otho Darner SIGMOIDOSCOPY;  Surgeon: Inda Castle, MD;  Location: [facility];  Service: Endoscopy;  Laterality: N/A;   Social History:  reports that she quit smoking about 19 months ago. Her smoking use included Cigarettes. She has a 12 pack-year smoking history. She has never used smokeless tobacco. She reports that she does not drink alcohol or use illicit drugs.  Allergies  Allergen Reactions  . Clindamycin/Lincomycin Diarrhea    Leads to colitis flare  . Ivp Dye [Iodinated Diagnostic Agents] Other (See Comments)    "almost passed out"    Family History  Problem Relation Age of Onset  . Colitis Brother     1 bro with Crohn's, another bro with unspecified colitis.   . Stomach cancer Mother   . Stroke Father     died  . Colon cancer Neg Hx      Prior to Admission medications   Medication Sig Start Date End Date Taking? Authorizing Provider  acetaminophen (TYLENOL) 500 MG tablet Take 2 tablets (1,000 mg total) by mouth every 4 (four) hours as needed for mild pain or moderate pain. 08/28/14  Yes Robbie Lis, MD  ALPRAZolam Duanne Moron) 0.5 MG tablet Take 1 tablet (0.5 mg total) by mouth 2 (two) times daily as needed for anxiety. 10/07/14  Yes Velvet Bathe, MD  CRESTOR 40 MG tablet TAKE 1 TABLET AT BEDTIME 05/17/14  Yes Lelon Perla, MD  feeding supplement (BOOST / RESOURCE BREEZE) LIQD Take 1 Container by mouth 3 (three) times daily between meals. Patient  taking differently: Take 1 Container by mouth 3 (three) times daily between meals. 9am, 2pm, 6pm 09/21/14  Yes Albertine Patricia, MD  mesalamine (LIALDA) 1.2 G EC tablet take 4 tablets by mouth once daily Patient taking differently: Take 4.8 g by mouth daily.  07/31/14  Yes Inda Castle, MD  predniSONE (DELTASONE) 1 MG tablet Take 1 tablet (1 mg total) by mouth daily with breakfast. For 3 doses Patient taking differently: Take 1 mg by mouth daily with breakfast. For 3 doses: 10/06/14, 10/07/14, 10/08/14 10/06/14  Yes Dawood S Elgergawy, MD  psyllium (HYDROCIL/METAMUCIL) 95 % PACK Take 1 packet by mouth 2 (two) times daily. Patient taking differently: Take 1 packet by mouth 2 (two) times daily. Mix in 6-8 oz of liquid and drink 08/28/14  Yes Robbie Lis, MD  saccharomyces boulardii (FLORASTOR) 250 MG capsule Take 1 capsule (250 mg total) by mouth 2 (two) times daily. 08/03/14  Yes Amy  S [name], PA-C  sertraline (ZOLOFT) 50 MG tablet Take 1 tablet (50 mg total) by mouth daily. 09/27/14  Yes Lauree Chandler, NP  predniSONE (DELTASONE) 1 MG tablet Take 0.5 tablets (0.5 mg total) by mouth daily with breakfast. For 3 doses Patient taking differently: Take 0.5 mg by mouth daily with breakfast. For 3 doses: 10/09/14, 10/10/14, 10/11/14 10/09/14   Albertine Patricia, MD   Physical Exam: Filed Vitals:   10/10/14 0730 10/10/14 0805 10/10/14 0815 10/10/14 0900  BP: 109/51 113/71 123/85 115/56  Pulse: 88 107 80 104  Temp:      TempSrc:      Resp:  15    SpO2: 98% 100% 96% 98%    Wt Readings from Last 3 Encounters:  10/06/14 96.208 kg (212 lb 1.6 oz)  09/21/14 98.8 kg (217 lb 13 oz)  08/07/14 104.237 kg (229 lb 12.8 oz)    General:  Chronically ill-appearing, debilitated, weak, is awake and alert. Eyes: PERRL, normal lids, irises & conjunctiva ENT: grossly normal hearing, lips & tongue, dry oral mucosa Neck: no LAD, masses or thyromegaly Cardiovascular: RRR, no m/r/g. No LE edema. Telemetry: SR, no  arrhythmias  Respiratory: CTA bilaterally, no w/r/r. Normal respiratory effort. Abdomen: Abdomen is soft there is ileostomy in place, appears to be functioning well, liquid consistency stool in bag, did not notice gross blood. Skin: no rash or induration seen on limited exam Musculoskeletal: grossly normal tone BUE/BLE Psychiatric: grossly normal mood and affect, speech fluent and appropriate Neurologic: grossly non-focal.          Labs on Admission:  Basic Metabolic Panel:  Recent Labs Lab 10/04/14 0447 10/10/14 0615  NA 129* 133*  K 3.8 3.5  CL 102 101  CO2 20* 24  GLUCOSE 86 105*  BUN 7 <5*  CREATININE 0.64 0.49  CALCIUM 8.0* 8.4*   Liver Function Tests:  Recent Labs Lab 10/10/14 0615  AST 37  ALT 31  ALKPHOS 86  BILITOT 0.7  PROT 5.5*  ALBUMIN 2.2*    Recent Labs Lab 10/10/14 0615  LIPASE 27   No results for input(s): AMMONIA in the last 168 hours. CBC:  Recent Labs Lab 10/04/14 0906 10/06/14 1222 10/07/14 0444 10/10/14 0615  WBC 10.7* 10.3 8.8 11.5*  NEUTROABS  --   --   --  8.9*  HGB 8.6* 9.1* 8.4* 9.5*  HCT 26.5* 28.7* 26.4* 29.7*  MCV 93.0 92.6 92.3 92.0  PLT 166 200 180 218   Cardiac Enzymes: No results for input(s): CKTOTAL, CKMB, CKMBINDEX, TROPONINI in the last 168 hours.  BNP (last 3 results) No results for input(s): BNP in the last 8760 hours.  ProBNP (last 3 results) No results for input(s): PROBNP in the last 8760 hours.  CBG:  Recent Labs Lab 10/06/14 0751 10/06/14 1136 10/06/14 1617 10/07/14 0753 10/07/14 1121  GLUCAP 81 144* 109* 89 114*    Radiological Exams on Admission: Ct Abdomen Pelvis Wo Contrast  10/10/2014   CLINICAL DATA:  Ulcerative colitis and recent rectal abscess. Abdominal pain and bloody stools.  EXAM: CT ABDOMEN AND PELVIS WITHOUT CONTRAST  TECHNIQUE: Multidetector CT imaging of the abdomen and pelvis was performed following the standard protocol without IV contrast.  COMPARISON:  09/29/2014   FINDINGS: BODY WALL: A presumably packed defect in the right gluteal fold which tracks to the thickened right levator is more distended than before. There is no tracking subcutaneous gas or evidence of undrained fluid collection.  Ostomy in the right lower  quadrant for loop ileostomy.  LOWER CHEST: Extensive coronary atherosclerosis, status post CABG. Prominent calcification of the aortic root wall with apparent dilatation at least partly related to obliquity of imaging.  ABDOMEN/PELVIS:  Liver: Subtle undulation the liver surface but no other morphologic changes of cirrhosis.  Biliary: Cholecystectomy. Common bile duct enlargement is chronic and stable.  Pancreas: Stable borderline dilatation of the main pancreatic duct at 3 mm. No active inflammatory changes.  Spleen: Unremarkable.  Adrenals: Unremarkable.  Kidneys and ureters: Hilar calcifications are arterial. Bilateral lobulated renal cortex consistent with scarring on postcontrast imaging comparison. 22 mm left renal cyst. No hydronephrosis.  Bladder: Decompressed by a Foley catheter.  Reproductive: Hysterectomy.  Negative adnexa.  Pelvic floor laxity.  Bowel: Pan colonic thickening even when accounting for underdistention with submucosal low density either from edema and/or fat. Mild haziness of the surrounding fat confirms inflammation. No small bowel obstruction or inflammatory change. No appendicitis.  Retroperitoneum: No mass or adenopathy.  Peritoneum: No ascites or pneumoperitoneum.  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  OSSEOUS: No acute finding. Lower lumbar facet arthropathy with low grade 1 anterolisthesis at L4-5.  IMPRESSION: 1. Pancolitis which could be active ulcerative colitis or infectious. 2. Packed right perianal and ischiorectal fossa abscess. No evidence of undrained fluid collection.   Electronically Signed   By: Monte Fantasia M.D.   On: 10/10/2014 08:03    EKG: Independently reviewed.    Assessment/Plan Principal Problem:   Ulcerative colitis Active Problems:   Abscess, perirectal s/p I&D 08/23/2014   Essential hypertension   Hyponatremia   UTI (lower urinary tract infection)   1. Ulcerative colitis. Patient having multiple recent hospitalizations in June and July for abdominal pain, bloody diarrhea in setting of worsening ulcer of colitis. She has been treated with several courses of antibiotic therapy along with systemic steroids. Clinical course complicated by the development of perirectal abscess along with bilateral DVTs. On 09/13/2014 she underwent laparoscopic diverting ileostomy and debridement of perianal wound. She presents with worsening abdominal pain, repeat CT scan showing pancolitis I could be secondary to active ulcerative colitis or infectious process per radiology report. I discussed case with GI who recommended holding steroids and antibiotic therapy for now. Will admit to MedSurg, provide IV fluids, supportive care. Await further recommendations from GI. 2. Question urinary tract infection. On previous admission urinalysis grew 10,000 colony-forming units of Klebsiella, organism susceptible to cephalosporins. Urinalysis from 08/23/2014 showed only a few bacteria negative for leukocytes and nitrates. Was watched off of antibiotic therapy as it was felt to be contaminant. Now today urinalysis showed the presence of many bacteria along with positive nitrates. It is possible this may represent an active urinary tract infection, will cover empirically with ceftriaxone 1 g IV every 24 hours. Follow up on cultures.  3. History of perirectal abscess status post incision and drainage. Patient continues to have some bleeding from I&D site, consulted general surgery. 4. History of bilateral DVTs. Because of recurrent GI bleeds in setting of ulcerative colitis she was not treated with anticoagulation rather IVC filter was placed. 5. Severe protein calorie malnutrition.  Continue protein boost 6. Hyponatremia. Labs showing sodium of 133, provide IV fluid resuscitation with NS 7. Hypertension. Several antihypertensives agents were discontinued on recent hospitalization because of hypotension. Blood pressures are stable in the ER.  8. Severe deconditioning. Physical therapy consultation placed   Code Status: Full Code DVT Prophylaxis: SCDD's Family Communication: Family not present Disposition Plan: Will admit to inpatient  service, anticipate patient will require greater than 2 nights hospitalization   Time spent: 70 min  Kelvin Cellar [facility] Pager [phone]

## 2014-10-10 NOTE — ED Notes (Signed)
Pt to ED from [facility] facility c/o lower abdominal pain. Was supposed to have a doctor's appt on 10/09/14, but it was cancelled by office. Pt also reports rectal bleeding as well

## 2014-10-10 NOTE — ED Notes (Signed)
Claiborne Billings, [place] (Surgery) in with patient at this time

## 2014-10-10 NOTE — ED Notes (Signed)
Pt transporting to [facility] with belongings in belonging bag. Daughter with pt.

## 2014-10-10 NOTE — ED Provider Notes (Addendum)
74 year old female with a history of ulcerative colitis and recent rectal abscess presents with abdominal pain from the nursing home. Also noted to have grossly bloody stools prior to transport. Mild tachycardia in the emergency department the blood pressure is normal. Tenderness to palpation in bilateral lower quadrants with some guarding but no definite rebound tenderness. CT abd/pelvic scan ordered the patient has IV contrast allergy.   Julianne Rice, MD 10/10/14 3300  Julianne Rice, MD 10/10/14 [phone]

## 2014-10-10 NOTE — ED Notes (Signed)
Called lab to report samples available for orders.

## 2014-10-10 NOTE — Consult Note (Addendum)
WOC ostomy consult note CCS assessed earlier and provided instructions for wound care. Stoma type/location: Consult requested for ileostomy assistance.  Pt had surgery performed during a previous admission and has been in a SNF where she has total assistance with pouch application and emptying.   Stomal assessment/size: Stoma red, flush with skin level, 11/2 inches Peristomal assessment: Macerated to 1 cm surrounding stoma from previous leakage. Output: Mod amt green liquid stool  Ostomy pouching: 1pc.  Education provided: Previous pouch was flat and leaking behind the barrier onto patient's skin.  Applied barrier ring and convex pouch to maintain seal since stoma is flush with skin level.  Demonstrated steps to patient and family member. Supplies ordered to room for staff nurse use. Discussed importance of calling for assistance to empty when pouch is half full.  Pt verbalizes understanding. Please re-consult if further assistance is needed.  Thank-you,  Julien Girt MSN, [place], [place], [place], [place]

## 2014-10-10 NOTE — ED Notes (Signed)
Patient resting on stretcher, no needs at this time 

## 2014-10-10 NOTE — Progress Notes (Signed)
Patient ID: [name], female   DOB: [birth year], 74 y.o.   MRN: [id number]    Subjective: Pt known to our service for laparoscopic loop diverting ileostomy on 09/13/14 by Dr. Stark Klein for severe UC vs crohns.  She also had a perirectal abscess with fistula that was debrided.  The patient missed her appointment with Dr. Barry Dienes as an outpatient and is currently being readmitted for persistent colitis.  We have been asked to evaluate her for post op evaluation.  Objective: Vital signs in last 24 hours: Temp:  [98.1 F (36.7 C)] 98.1 F (36.7 C) (08/23 0509) Pulse Rate:  [77-109] 77 (08/23 1115) Resp:  [15-20] 15 (08/23 1057) BP: (109-125)/(51-85) 109/59 mmHg (08/23 1115) SpO2:  [94 %-100 %] 98 % (08/23 1115)    Intake/Output from previous day:   Intake/Output this shift: Total I/O In: 500 [I.V.:500] Out: 400 [Urine:400]  PE: Abd: soft, doesn't seem very tender right now, ileostomy with good output, stoma is pink and viable, +BS Skin: buttock wound is packed.  Packing was removed and her wound was 100% clean with healthy granulation tissue present (evaluate with Dr. Coralyn Pear)  Lab Results:   Recent Labs  10/10/14 0615  WBC 11.5*  HGB 9.5*  HCT 29.7*  PLT 218   BMET  Recent Labs  10/10/14 0615  NA 133*  K 3.5  CL 101  CO2 24  GLUCOSE 105*  BUN <5*  CREATININE 0.49  CALCIUM 8.4*   PT/INR  Recent Labs  10/10/14 0615  LABPROT 14.7  INR 1.13   CMP     Component Value Date/Time   NA 133* 10/10/2014 0615   K 3.5 10/10/2014 0615   CL 101 10/10/2014 0615   CO2 24 10/10/2014 0615   GLUCOSE 105* 10/10/2014 0615   BUN <5* 10/10/2014 0615   CREATININE 0.49 10/10/2014 0615   CREATININE 1.08 03/28/2014 1022   CALCIUM 8.4* 10/10/2014 0615   PROT 5.5* 10/10/2014 0615   ALBUMIN 2.2* 10/10/2014 0615   AST 37 10/10/2014 0615   ALT 31 10/10/2014 0615   ALKPHOS 86 10/10/2014 0615   BILITOT 0.7 10/10/2014 0615   GFRNONAA >60 10/10/2014 0615   GFRNONAA 50*  03/28/2014 1022   GFRAA >60 10/10/2014 0615   GFRAA 58* 03/28/2014 1022   Lipase     Component Value Date/Time   LIPASE 27 10/10/2014 0615       Studies/Results: Ct Abdomen Pelvis Wo Contrast  10/10/2014   CLINICAL DATA:  Ulcerative colitis and recent rectal abscess. Abdominal pain and bloody stools.  EXAM: CT ABDOMEN AND PELVIS WITHOUT CONTRAST  TECHNIQUE: Multidetector CT imaging of the abdomen and pelvis was performed following the standard protocol without IV contrast.  COMPARISON:  09/29/2014  FINDINGS: BODY WALL: A presumably packed defect in the right gluteal fold which tracks to the thickened right levator is more distended than before. There is no tracking subcutaneous gas or evidence of undrained fluid collection.  Ostomy in the right lower quadrant for loop ileostomy.  LOWER CHEST: Extensive coronary atherosclerosis, status post CABG. Prominent calcification of the aortic root wall with apparent dilatation at least partly related to obliquity of imaging.  ABDOMEN/PELVIS:  Liver: Subtle undulation the liver surface but no other morphologic changes of cirrhosis.  Biliary: Cholecystectomy. Common bile duct enlargement is chronic and stable.  Pancreas: Stable borderline dilatation of the main pancreatic duct at 3 mm. No active inflammatory changes.  Spleen: Unremarkable.  Adrenals: Unremarkable.  Kidneys and ureters: Hilar calcifications are  arterial. Bilateral lobulated renal cortex consistent with scarring on postcontrast imaging comparison. 22 mm left renal cyst. No hydronephrosis.  Bladder: Decompressed by a Foley catheter.  Reproductive: Hysterectomy.  Negative adnexa.  Pelvic floor laxity.  Bowel: Pan colonic thickening even when accounting for underdistention with submucosal low density either from edema and/or fat. Mild haziness of the surrounding fat confirms inflammation. No small bowel obstruction or inflammatory change. No appendicitis.  Retroperitoneum: No mass or adenopathy.   Peritoneum: No ascites or pneumoperitoneum.  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  OSSEOUS: No acute finding. Lower lumbar facet arthropathy with low grade 1 anterolisthesis at L4-5.  IMPRESSION: 1. Pancolitis which could be active ulcerative colitis or infectious. 2. Packed right perianal and ischiorectal fossa abscess. No evidence of undrained fluid collection.   Electronically Signed   By: Monte Fantasia M.D.   On: 10/10/2014 08:03    Anti-infectives: Anti-infectives    None       Assessment/Plan  1. Post op for lap diverting ileostomy and debridement of buttock wound -patient doing well surgically. -conts to have persistent colitis.  Will defer further care to GI service -cont BID dressing changes to her buttock wound -cont routine ostomy care -will set up a new appointment for the patient to see Dr. Barry Dienes when she is able to be discharged and follow up. -no other surgical needs, we will sign off.   LOS: 0 days    Ariellah Faust E 10/10/2014, 11:28 AM Pager: [phone]

## 2014-10-10 NOTE — ED Notes (Signed)
Patient resting on stretcher no needs at this time

## 2014-10-10 NOTE — ED Notes (Signed)
Ordered pt a clear liquid diet lunch tray.

## 2014-10-10 NOTE — Consult Note (Signed)
[facility] Consult: 9:20 AM 10/10/2014     Referring Provider: [place] in ED, Coralyn Pear from [facility].  Primary Care Physician:  Kandice Hams, MD Primary Gastroenterologist:  Dr. Deatra Ina     Reason for Consultation: bleeding from rectal abscess/fistula.     HPI: [name] is a 74 y.o. female. PMH CAD, MI, CABG 2006, bil carotid artery disease/sp CEA, glucose intolerance/"prediabetes". Hx CVA, s/p IVC filter. Depression, anxiety/  Left sided UC, diagnosed 2006. Did well on Lialda 4.8 grams daily.  Colonoscopy Sept 2014: scattered left sided tics and focal left sided colitis, mild cryptitis on Bx.   Admissions for worsening UC, bloody diarrhea, starting 6/20 - 08/21/14. Treated with proctofoam, probiotic, steroids, briefly on vancomycin po and IV Flagyl. 08/08/14 Flex Sig: diffuse edema to 40 cm, pseudomembranes over inflamed but mostly normal tissue 40 to 70 cm.  Pathology of descending/sigmoid: moderately active chronic colitis c/w IBD.  Path on sigmoid/rectum: moderate to severe active chronic colitis. No evidence of C diff. C diff PCR negative 08/10/14. Discharged on prednisone 20 mg daily, lialda 4.8 grams daily, Florastor .  Readmitted 7/5 - 09/21/14 with perirectal abscess, escalating rectal pain. Developed DVT, s/p IVC filter 7/13. Required nutritional support with TNA but tolerating po diet at discharge.  08/22/14 CT abdomen pelvis with contrast showing extensive gas in the right gluteal fat and musculature extending throughout the retroperitoneum. Thickening, consistent with colitis in the distal transverse and descending colon. 08/23/14: I & D of complex perirectal abscess.  09/06/14 Flex Sig: severe left sided colitis from anal canal to splenic flexure/80 cm. Bleeding from large ulcerations in left  colon.  09/13/14: Laparoscopic diverting ileostomy, exam under anesthesia. Normal TI and cecum. 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  Remicade initiated 7/15, 2nd dose 8/2, 3rd dose due 9/1.  Discharged to SNF on Augmentin, Lialda, florastor, prednisone taper through 8/25.  Infectious disease MD felt she would need another 2 to 3 weeks of abx.   Admission 8/12- 10/07/14 with rectal bleeding, pinkinsh discharge from abscess cavity. During admission some dark/blood tinged ostomy output notes.  09/29/14 CT scan without mention of colitis, abscess.  10/04/14 Flex Sig to mid sigmoid: mild colitis, small amount old blood but not active bleeding. Continued on Prednisone taper. Grew pseudomonas from urine, Zosyn replaced Augmentin during admission. Dr Hale Bogus opinion was that this was asymptomatic bacteruria related to pt indwelling foley which did not need treatement.  He stopped Zosyn, Fluconazole, advising observation off anti-infectious meds.  Discharged back to SNF.  Pt was no show for gen surg ROV yesterday 8/22 with Dr Barry Dienes.  Has 9/1 Remicade infusion and GI ROV set for 10/31/14.   Sent again from SNF with bleeding from either the abscess cavity or the rectum.  As for abdominal pain, pt says it is stable, in lower abdomen/pelvic region, and not any worse.  No N/V.  Says appetite is fair.  No chills or fever.  Is very sad and unhappy.  Weakness/fatigue is chronic for last  many weeks. No chest pain, heart burn, dyspnea.    10/10/14 CT scan now showing pan colitis now extending into fat planes, packing in place at tight ischiorectal abscess fossa.  Overall this is a change and worsening picture c/w CT of 11 days ago.   Labs: Hgb 9.5, was 8.4 on 8/20.  WBCs to 11.5. Na still low at 133, improved from 129 6 days ago. No renal dysfunction. Coags normal as per usual.   She has a chronic indwelling Foley catheter in place. No blood is seen in the  Foley.   Past Medical History  Diagnosis Date  . CAD (coronary artery disease)     unspecified  . Cerebrovascular disease 2010    bil carotid artery disease. left CEA 02/2004.   Marland Kitchen Hyperlipidemia, mixed   . HTN (hypertension)   . Ulcerative colitis 2002  . Colon polyps 2006  . Thyroid disease   . Myocardial infarct 1998  . Carotid artery occlusion 2010  . Diabetes mellitus without complication     Borderline  . GERD (gastroesophageal reflux disease)   . Arthritis   . 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     Past Surgical History  Procedure Laterality Date  . Cholecystectomy  7.06.2008    with cholangiogram  . 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 Abdominal hysterectomy    . Colonoscopy  multiple  . Sigmoidoscopy  multiple  . Flexible sigmoidoscopy N/A 08/08/2014    Procedure: FLEXIBLE SIGMOIDOSCOPY;  Surgeon: Gatha Mayer, MD;  Location: [facility];  Service: Endoscopy;  Laterality: N/A;  . Incision and drainage perirectal abscess N/A 08/22/2014    Procedure: IRRIGATION AND DEBRIDEMENT PERIRECTAL ABSCESS;  Surgeon: Jackolyn Confer, MD;  Location: [facility];  Service: General;  Laterality: N/A;  . Joint replacement  left knee  . Flexible sigmoidoscopy N/A 09/06/2014    Procedure: FLEXIBLE SIGMOIDOSCOPY;  Surgeon: Lafayette Dragon, MD;  Location: [facility];  Service: Endoscopy;  Laterality: N/A;  . Laparoscopic diverted colostomy N/A 09/13/2014    Procedure: LAPAROSCOPIC DIVERTED ILEOSTOMY;  Surgeon: Stark Klein, MD;  Location: [facility];  Service: General;  Laterality: N/A;  . Rectal exam under anesthesia N/A 09/13/2014    Procedure: RECTAL EXAM UNDER ANESTHESIA  AND DEBRIDEMENT OF PERIANAL WOUND;  Surgeon: Stark Klein, MD;  Location: [facility];  Service: General;  Laterality: N/A;  . Flexible sigmoidoscopy N/A 10/04/2014    Procedure: Otho Darner SIGMOIDOSCOPY;  Surgeon: Inda Castle, MD;  Location: [facility];  Service: Endoscopy;  Laterality: N/A;    Prior to  Admission medications   Medication Sig Start Date End Date Taking? Authorizing Provider  acetaminophen (TYLENOL) 500 MG tablet Take 2 tablets (1,000 mg total) by mouth every 4 (four) hours as needed for mild pain or moderate pain. 08/28/14  Yes Robbie Lis, MD  ALPRAZolam Duanne Moron) 0.5 MG tablet Take 1 tablet (0.5 mg total) by mouth 2 (two) times daily as needed for anxiety. 10/07/14  Yes Velvet Bathe, MD  CRESTOR 40 MG tablet TAKE 1 TABLET AT BEDTIME 05/17/14  Yes Lelon Perla, MD  feeding supplement (BOOST / RESOURCE BREEZE) LIQD Take 1 Container by mouth 3 (three) times daily between meals. Patient taking differently: Take 1 Container by mouth 3 (three) times daily between meals. 9am, 2pm, 6pm 09/21/14  Yes Albertine Patricia, MD  mesalamine (LIALDA) 1.2 G EC tablet take 4 tablets by mouth once daily Patient taking differently: Take 4.8 g by mouth daily.  07/31/14  Yes Inda Castle, MD  predniSONE (DELTASONE) 1 MG tablet Take 1 tablet (1 mg total) by mouth daily with breakfast. For 3 doses Patient taking differently: Take 1 mg by mouth daily with breakfast. For 3 doses: 10/06/14, 10/07/14, 10/08/14 10/06/14  Yes Dawood S Elgergawy, MD  psyllium (HYDROCIL/METAMUCIL) 95 % PACK Take 1 packet by mouth 2 (two) times daily. Patient taking differently: Take 1 packet by mouth 2 (two) times daily. Mix in 6-8 oz of liquid and drink 08/28/14  Yes Robbie Lis, MD  saccharomyces boulardii (FLORASTOR) 250 MG capsule Take 1 capsule (250 mg total) by mouth 2 (two) times daily. 08/03/14  Yes Amy S [name], PA-C  sertraline (ZOLOFT) 50 MG tablet Take 1 tablet (50 mg total) by mouth daily. 09/27/14  Yes Lauree Chandler, NP  predniSONE (DELTASONE) 1 MG tablet Take 0.5 tablets (0.5 mg total) by mouth daily with breakfast. For 3 doses Patient taking differently: Take 0.5 mg by mouth daily with breakfast. For 3 doses: 10/09/14, 10/10/14, 10/11/14 10/09/14   Albertine Patricia, MD    Scheduled Meds:  Infusions:  PRN  Meds:    Allergies as of 10/10/2014 - Review Complete 10/10/2014  Allergen Reaction Noted  . Clindamycin/lincomycin Diarrhea 08/07/2014  . Ivp dye [iodinated diagnostic agents] Other (See Comments) 01/14/2011    Family History  Problem Relation Age of Onset  . Colitis Brother     1 bro with Crohn's, another bro with unspecified colitis.   . Stomach cancer Mother   . Stroke Father     died  . Colon cancer Neg Hx     Social History   Social History  . Marital Status: Married    Spouse Name: N/A  . Number of Children: N/A  . Years of Education: N/A   Occupational History  . retired    Social History Main Topics  . Smoking status: Former Smoker -- 0.30 packs/day for 40 years    Types: Cigarettes    Quit date: 02/17/2013  . Smokeless tobacco: Never Used  . Alcohol Use: No  . Drug Use: No  . Sexual Activity: Not on file   Other Topics Concern  . Not on file   Social History Narrative    REVIEW OF SYSTEMS: Constitutional:  Pt requiring full assist of staff and hoist for moving bed to chair.  Unable to stand even with assist  ENT:  No nose bleeds Pulm:  No SOB or cough.  CV:  No palpitations, no LE edema.  GU:  No hematuria, no frequency GI:  Per HPI Heme: Other than the rectal/perirectal bleeding, there has been no unusual bleeding from other locations on the body.  Transfusions: No records of previous blood transfusions. Neuro:  No headaches, no peripheral tingling or numbness Derm:  No itching, no rash or sores.  Endocrine:  No sweats or chills.  No polyuria or dysuria Immunization:  Not queried.  Travel:  None as has bee inpt or at SNF in last 3 months.    PHYSICAL EXAM: Vital signs in last 24 hours: Filed Vitals:   10/10/14 0815  BP: 123/85  Pulse: 80  Temp:   Resp:    Wt Readings from Last 3 Encounters:  10/06/14 212 lb 1.6 oz (96.208 kg)  09/21/14 217 lb 13 oz (98.8 kg)  08/07/14 229 lb 12.8 oz (104.237 kg)   General: Frail, unhappy and  moaningtearful at times during exam, no acute physical distress,  Look ill but not toxic.  Head: No facial asymmetry or edema. No signs of head trauma.  Eyes: No scleral icterus, no conjunctival pallor. EOMI. Ears: No hearing deficit  Nose: No congestion or discharge. No sneezing. Mouth: Mucous membranes moist and clear.  Tongue midline. Edentulous. No signs of Candida. Neck: No JVD, no masses, no TMG. Lungs: No labored breathing or cough. Clear to auscultation bilaterally with good breath sounds. Heart: RRR. No MRG. Abdomen: liquid medium brown stool in ostomy. mild tenderness across lower to mid abdomen, no guard or rebound.   Rectal: Gauze packing noted in the perirectal abscess cavity with small to moderate amount of fresh blood. Digital exam of rectum notable for tenderness, no mass and spec of red blood which may be contaminant from peri rectal exam. No fluctuance. sacral pressure bandage was not removed.   GU: foley in place.  Musc/Skeltl: No joint erythema swelling or contracture. Extremities: No CCE. Feet are warm.  Neurologic: Patient oriented to place, year and situation. Moves all 4 limbs. Limb strength is grossly 5 over 5 in upper and lower extremities. Skin: No telangiectasia. Other than the sacral skin breakdown, no other sores or rashes. Tattoos: None Nodes: No cervical adenopathy.  Psych: Cooperative, oriented 3. Alert, though a bit drowsy  Intake/Output from previous day:   Intake/Output this shift: Total I/O In: 500 [I.V.:500] Out: 400 [Urine:400]  LAB RESULTS:  Recent Labs  10/10/14 0615  WBC 11.5*  HGB 9.5*  HCT 29.7*  PLT 218   BMET Lab Results  Component Value Date   NA 133* 10/10/2014   NA 129* 10/04/2014   NA 130* 10/01/2014   K 3.5 10/10/2014   K 3.8 10/04/2014   K 4.8 10/01/2014   CL 101 10/10/2014   CL 102 10/04/2014   CL 103 10/01/2014   CO2 24 10/10/2014   CO2 20* 10/04/2014   CO2 20* 10/01/2014   GLUCOSE  105* 10/10/2014   GLUCOSE 86 10/04/2014   GLUCOSE 124* 10/01/2014   BUN <5* 10/10/2014   BUN 7 10/04/2014   BUN 9 10/01/2014   CREATININE 0.49 10/10/2014   CREATININE 0.64 10/04/2014   CREATININE 0.62 10/01/2014   CALCIUM 8.4* 10/10/2014   CALCIUM 8.0* 10/04/2014   CALCIUM 8.4* 10/01/2014   LFT  Recent Labs  10/10/14 0615  PROT 5.5*  ALBUMIN 2.2*  AST 37  ALT 31  ALKPHOS 86  BILITOT 0.7   PT/INR Lab Results  Component Value Date   INR 1.13 10/10/2014   INR 1.09 09/29/2014   INR 1.00 08/31/2014   Hepatitis Panel No results for input(s): HEPBSAG, HCVAB, HEPAIGM, HEPBIGM in the last 72 hours. C-Diff No components found for: CDIFF Lipase     Component Value Date/Time   LIPASE 27 10/10/2014 0615    Drugs of Abuse  No results found for: LABOPIA, COCAINSCRNUR, LABBENZ, AMPHETMU, THCU, LABBARB   RADIOLOGY STUDIES: Ct Abdomen Pelvis Wo Contrast  10/10/2014   CLINICAL DATA:  Ulcerative colitis and recent rectal abscess. Abdominal pain and bloody stools.  EXAM: CT ABDOMEN AND PELVIS WITHOUT CONTRAST  TECHNIQUE: Multidetector CT imaging of the abdomen and pelvis was performed following the standard protocol without IV contrast.  COMPARISON:  09/29/2014  FINDINGS: BODY WALL: A presumably packed defect in the right gluteal fold which tracks to the thickened right levator is more distended than before. There is no tracking subcutaneous gas or evidence of undrained fluid collection.  Ostomy in the right lower quadrant for loop ileostomy.  LOWER CHEST: Extensive coronary atherosclerosis, status post  CABG. Prominent calcification of the aortic root wall with apparent dilatation at least partly related to obliquity of imaging.  ABDOMEN/PELVIS:  Liver: Subtle undulation the liver surface but no other morphologic changes of cirrhosis.  Biliary: Cholecystectomy. Common bile duct enlargement is chronic and stable.  Pancreas: Stable borderline dilatation of the main pancreatic duct at 3 mm. No  active inflammatory changes.  Spleen: Unremarkable.  Adrenals: Unremarkable.  Kidneys and ureters: Hilar calcifications are arterial. Bilateral lobulated renal cortex consistent with scarring on postcontrast imaging comparison. 22 mm left renal cyst. No hydronephrosis.  Bladder: Decompressed by a Foley catheter.  Reproductive: Hysterectomy.  Negative adnexa.  Pelvic floor laxity.  Bowel: Pan colonic thickening even when accounting for underdistention with submucosal low density either from edema and/or fat. Mild haziness of the surrounding fat confirms inflammation. No small bowel obstruction or inflammatory change. No appendicitis.  Retroperitoneum: No mass or adenopathy.  Peritoneum: No ascites or pneumoperitoneum.  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  OSSEOUS: No acute finding. Lower lumbar facet arthropathy with low grade 1 anterolisthesis at L4-5.  IMPRESSION: 1. Pancolitis which could be active ulcerative colitis or infectious. 2. Packed right perianal and ischiorectal fossa abscess. No evidence of undrained fluid collection.   Electronically Signed   By: Monte Fantasia M.D.   On: 10/10/2014 08:03    ENDOSCOPIC STUDIES: Per HPI  IMPRESSION:   *  Complicated pt with 10 year hx UC.  Until 2 to 3 months ago this was stable on Lialda. But then developed severe colitis, rectal abscess and perirectal fistula and required diverting ileostomy (so question has colitis morphed into Crohn's?).   Flex sig last week showed notable improvement in her colitis.   Now with rapid bounce back to ED with abdominal pain (this is fairly chronic and hard to interpret) and CT with worsening colitis.   She is nearing terminus of a prolonged Prednisone taper and all antibiotics/antifungals discontinued by ID 10/05/14. ? Flare of IBD vs new infectious colitis (C diff though has tested negative for this twice in last 2 months) given recent exposure to Augmentin and Zosyn.     *  ?  UTI.  Would wait to treat with abx until specimen on CC urine is collected. May be time to reconsider the safety of the indwelling foley.  *  Depression.    PLAN:     *  Per Dr Henrene Pastor.  Not inclined to accelerate steroids just yet.  In fact, given paltry 0.5 mg dose of prednisone, would stop this altogether.    *  Obtain stool for C diff from ostomy.    *  If abx initiated, make sure urine clx on CC urine has been obtained first  *  Would have gen surgery see her while she is here, as she missed the appt with Dr Barry Dienes yesterday.  Also consider psych eval and input as her depression, understandably, is flaring.     Azucena Freed  10/10/2014, 9:20 AM Pager: [phone]  GI ATTENDING  History, laboratories, x-rays, prior endoscopy reports with images reviewed. Patient seen and examined. EXTREMELY COMPLICATED case of inflammatory bowel disease. Initially felt to be also colitis. Concerns about Crohn's given perirectal disease. Sent to the emergency room with "bleeding". She does have some blood in the packed surgical wound site. Seen by surgery who feels that she is doing well from that perspective. Flexible sigmoidoscopy just 6 days ago with minimal colitis. CT picture confusing somewhat. It may be artifact  from underdistention. Other possibilities include flare of inflammatory bowel disease, infection, ischemia, or diversion colitis. Probably the best way to try to sort this out would be repeat flexible sigmoidoscopy with biopsies if indicated. The patient is high-risk. We will set this up for tomorrow afternoon.  Docia Chuck. Geri Seminole., M.D. [facility] Division of Gastroenterology

## 2014-10-10 NOTE — ED Notes (Signed)
Patient transported to CT 

## 2014-10-10 NOTE — ED Notes (Signed)
IV team currently at the bedside.

## 2014-10-10 NOTE — ED Provider Notes (Signed)
CSN: [id number]     Arrival date & time 10/10/14  0504 History   First MD Initiated Contact with Patient 10/10/14 [phone]     Chief Complaint  Patient presents with  . Abdominal Pain     (Consider location/radiation/quality/duration/timing/severity/associated sxs/prior Treatment) HPI Report from nursing, prior provider is 4 lower abdominal pain. Patient has a complex history with a rectal abscess and history of ulcerative colitis. She has had recent prolonged hospitalization. At the time of my interview patient is not reporting significant pain. She has however been treated with fentanyl and morphine. Nurse report from intake nurse is that she did have severe lower abdominal pain on arrival. There is also report of bloody bowel movement prior to arrival. Patient is a somewhat limited historian. She is predominantly focused on the total length of her illness and not providing significant present illness history. She is reporting to me that she has been able to take oral fluids and food as she sees fit. She is denying any vomiting. She denies known fever. The patient is very weak and debilitated, she is focused on her limitations surrounding her own care and prolonged hospitalization. Past Medical History  Diagnosis Date  . CAD (coronary artery disease)     unspecified  . Cerebrovascular disease 2010    bil carotid artery disease. left CEA 02/2004.   Marland Kitchen Hyperlipidemia, mixed   . HTN (hypertension)   . Ulcerative colitis 2002  . Colon polyps 2006  . Thyroid disease   . Myocardial infarct 1998  . Carotid artery occlusion 2010  . Diabetes mellitus without complication     Borderline  . GERD (gastroesophageal reflux disease)   . Arthritis   . 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    Past Surgical History  Procedure Laterality Date  . Cholecystectomy  7.06.2008    with cholangiogram  . 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 Abdominal hysterectomy    . Colonoscopy  multiple  .  Sigmoidoscopy  multiple  . Flexible sigmoidoscopy N/A 08/08/2014    Procedure: FLEXIBLE SIGMOIDOSCOPY;  Surgeon: Gatha Mayer, MD;  Location: [facility];  Service: Endoscopy;  Laterality: N/A;  . Incision and drainage perirectal abscess N/A 08/22/2014    Procedure: IRRIGATION AND DEBRIDEMENT PERIRECTAL ABSCESS;  Surgeon: Jackolyn Confer, MD;  Location: [facility];  Service: General;  Laterality: N/A;  . Joint replacement  left knee  . Flexible sigmoidoscopy N/A 09/06/2014    Procedure: FLEXIBLE SIGMOIDOSCOPY;  Surgeon: Lafayette Dragon, MD;  Location: [facility];  Service: Endoscopy;  Laterality: N/A;  . Laparoscopic diverted colostomy N/A 09/13/2014    Procedure: LAPAROSCOPIC DIVERTED ILEOSTOMY;  Surgeon: Stark Klein, MD;  Location: [facility];  Service: General;  Laterality: N/A;  . Rectal exam under anesthesia N/A 09/13/2014    Procedure: RECTAL EXAM UNDER ANESTHESIA  AND DEBRIDEMENT OF PERIANAL WOUND;  Surgeon: Stark Klein, MD;  Location: [facility];  Service: General;  Laterality: N/A;  . Flexible sigmoidoscopy N/A 10/04/2014    Procedure: Otho Darner SIGMOIDOSCOPY;  Surgeon: Inda Castle, MD;  Location: [place] [facility];  Service: Endoscopy;  Laterality: N/A;   Family History  Problem Relation Age of Onset  . Colitis Brother     1 bro with Crohn's, another bro with unspecified colitis.   . Stomach cancer Mother   . Stroke Father     died  . Colon cancer Neg Hx    Social History  Substance Use Topics  . Smoking status: Former Smoker -- 0.30  packs/day for 40 years    Types: Cigarettes    Quit date: 02/17/2013  . Smokeless tobacco: Never Used  . Alcohol Use: No   OB History    No data available     Review of Systems 10 Systems reviewed and are negative for acute change except as noted in the HPI.    Allergies  Clindamycin/lincomycin and Ivp dye  Home Medications   Prior to Admission medications   Medication Sig Start Date End Date Taking? Authorizing Provider  acetaminophen (TYLENOL)  500 MG tablet Take 2 tablets (1,000 mg total) by mouth every 4 (four) hours as needed for mild pain or moderate pain. 08/28/14  Yes Robbie Lis, MD  ALPRAZolam Duanne Moron) 0.5 MG tablet Take 1 tablet (0.5 mg total) by mouth 2 (two) times daily as needed for anxiety. 10/07/14  Yes Velvet Bathe, MD  CRESTOR 40 MG tablet TAKE 1 TABLET AT BEDTIME 05/17/14  Yes Lelon Perla, MD  feeding supplement (BOOST / RESOURCE BREEZE) LIQD Take 1 Container by mouth 3 (three) times daily between meals. Patient taking differently: Take 1 Container by mouth 3 (three) times daily between meals. 9am, 2pm, 6pm 09/21/14  Yes Albertine Patricia, MD  mesalamine (LIALDA) 1.2 G EC tablet take 4 tablets by mouth once daily Patient taking differently: Take 4.8 g by mouth daily.  07/31/14  Yes Inda Castle, MD  predniSONE (DELTASONE) 1 MG tablet Take 1 tablet (1 mg total) by mouth daily with breakfast. For 3 doses Patient taking differently: Take 1 mg by mouth daily with breakfast. For 3 doses: 10/06/14, 10/07/14, 10/08/14 10/06/14  Yes Dawood S Elgergawy, MD  psyllium (HYDROCIL/METAMUCIL) 95 % PACK Take 1 packet by mouth 2 (two) times daily. Patient taking differently: Take 1 packet by mouth 2 (two) times daily. Mix in 6-8 oz of liquid and drink 08/28/14  Yes Robbie Lis, MD  saccharomyces boulardii (FLORASTOR) 250 MG capsule Take 1 capsule (250 mg total) by mouth 2 (two) times daily. 08/03/14  Yes Amy S [name], PA-C  sertraline (ZOLOFT) 50 MG tablet Take 1 tablet (50 mg total) by mouth daily. 09/27/14  Yes Lauree Chandler, NP  predniSONE (DELTASONE) 1 MG tablet Take 0.5 tablets (0.5 mg total) by mouth daily with breakfast. For 3 doses Patient taking differently: Take 0.5 mg by mouth daily with breakfast. For 3 doses: 10/09/14, 10/10/14, 10/11/14 10/09/14   Silver Huguenin Elgergawy, MD   BP 115/56 mmHg  Pulse 104  Temp(Src) 98.1 F (36.7 C) (Oral)  Resp 15  SpO2 98% Physical Exam  Constitutional:  Patient is a debilitated appearing  elderly female. She is alert and interactive. She has no respiratory distress. The patient is nontoxic.  HENT:  Head: Normocephalic and atraumatic.  Mouth/Throat: No oropharyngeal exudate.  Tongue appears slightly dry.  Eyes: EOM are normal. Pupils are equal, round, and reactive to light.  Cardiovascular: Normal rate, regular rhythm, normal heart sounds and intact distal pulses.   Pulmonary/Chest: Effort normal and breath sounds normal. No respiratory distress.  Abdominal: Soft. She exhibits no distension.  There is very thin, watery brownish stool in the patient's colostomy bag. There is a frothy yellow foam on top. The patient's abdomen is soft, moderately reproducible diffuse lower abdominal pain.  Musculoskeletal: She exhibits no edema or tenderness.  Neurological: She is alert. No cranial nerve deficit. Coordination normal.  Patient is generally weak and deconditioned. There is no localizing weakness on her neurologic examination. She is using all 4  extremities purposely. She is limited in her movements by body habitus and general deconditioning.  Skin: Skin is warm and dry.  Psychiatric:  Patient has depressed affect.    ED Course  Procedures (including critical care time) Labs Review Labs Reviewed  CBC WITH DIFFERENTIAL/PLATELET - Abnormal; Notable for the following:    WBC 11.5 (*)    RBC 3.23 (*)    Hemoglobin 9.5 (*)    HCT 29.7 (*)    RDW 16.7 (*)    Neutro Abs 8.9 (*)    All other components within normal limits  COMPREHENSIVE METABOLIC PANEL - Abnormal; Notable for the following:    Sodium 133 (*)    Glucose, Bld 105 (*)    BUN <5 (*)    Calcium 8.4 (*)    Total Protein 5.5 (*)    Albumin 2.2 (*)    All other components within normal limits  URINALYSIS, ROUTINE W REFLEX MICROSCOPIC (NOT AT [facility]) - Abnormal; Notable for the following:    Ketones, ur 15 (*)    Protein, ur 30 (*)    Nitrite POSITIVE (*)    All other components within normal limits  URINE  MICROSCOPIC-ADD ON - Abnormal; Notable for the following:    Bacteria, UA MANY (*)    Crystals URIC ACID CRYSTALS (*)    All other components within normal limits  PROTIME-INR  LIPASE, BLOOD  APTT  I-STAT CG4 LACTIC ACID, ED    Imaging Review Ct Abdomen Pelvis Wo Contrast  10/10/2014   CLINICAL DATA:  Ulcerative colitis and recent rectal abscess. Abdominal pain and bloody stools.  EXAM: CT ABDOMEN AND PELVIS WITHOUT CONTRAST  TECHNIQUE: Multidetector CT imaging of the abdomen and pelvis was performed following the standard protocol without IV contrast.  COMPARISON:  09/29/2014  FINDINGS: BODY WALL: A presumably packed defect in the right gluteal fold which tracks to the thickened right levator is more distended than before. There is no tracking subcutaneous gas or evidence of undrained fluid collection.  Ostomy in the right lower quadrant for loop ileostomy.  LOWER CHEST: Extensive coronary atherosclerosis, status post CABG. Prominent calcification of the aortic root wall with apparent dilatation at least partly related to obliquity of imaging.  ABDOMEN/PELVIS:  Liver: Subtle undulation the liver surface but no other morphologic changes of cirrhosis.  Biliary: Cholecystectomy. Common bile duct enlargement is chronic and stable.  Pancreas: Stable borderline dilatation of the main pancreatic duct at 3 mm. No active inflammatory changes.  Spleen: Unremarkable.  Adrenals: Unremarkable.  Kidneys and ureters: Hilar calcifications are arterial. Bilateral lobulated renal cortex consistent with scarring on postcontrast imaging comparison. 22 mm left renal cyst. No hydronephrosis.  Bladder: Decompressed by a Foley catheter.  Reproductive: Hysterectomy.  Negative adnexa.  Pelvic floor laxity.  Bowel: Pan colonic thickening even when accounting for underdistention with submucosal low density either from edema and/or fat. Mild haziness of the surrounding fat confirms inflammation. No small bowel obstruction or  inflammatory change. No appendicitis.  Retroperitoneum: No mass or adenopathy.  Peritoneum: No ascites or pneumoperitoneum.  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  OSSEOUS: No acute finding. Lower lumbar facet arthropathy with low grade 1 anterolisthesis at L4-5.  IMPRESSION: 1. Pancolitis which could be active ulcerative colitis or infectious. 2. Packed right perianal and ischiorectal fossa abscess. No evidence of undrained fluid collection.   Electronically Signed   By: Monte Fantasia M.D.   On: 10/10/2014 08:03   I have personally reviewed and evaluated these  images and lab results as part of my medical decision-making.   EKG Interpretation None     Consult: This case was reviewed with Theodis Aguas [facility]. She has reviewed the CT findings radiology and there has been a distinct worsening. At this time the patient we admitted to the hospitalist for ongoing management with GI consultation. MDM   Final diagnoses:  Pancolitis, unspecified complication  Severe comorbid illness    Patient is nontoxic at this point time. She does is stable vital signs. She has been pain control with narcotics in the emergency department. She will be admitted for worsening of pre-existing colitis. Antibiotics and anti-inflammatory decisions will be made pending GI consultation.    Charlesetta Shanks, MD 10/10/14 1055

## 2014-10-10 NOTE — ED Notes (Signed)
Missed attempt at IV access, Pamala Hurry, 2nd Rn at the bedside.

## 2014-10-11 ENCOUNTER — Encounter (HOSPITAL_COMMUNITY)
Admission: EM | Disposition: A | Discharge status: Skilled Nursing Facility | Payer: Self-pay | Source: Home / Self Care | Attending: Internal Medicine

## 2014-10-11 ENCOUNTER — Inpatient Hospital Stay (HOSPITAL_COMMUNITY): Payer: Commercial Managed Care - HMO | Admitting: Anesthesiology

## 2014-10-11 ENCOUNTER — Encounter (HOSPITAL_COMMUNITY): Payer: Self-pay | Admitting: *Deleted

## 2014-10-11 DIAGNOSIS — I10 Essential (primary) hypertension: Secondary | ICD-10-CM

## 2014-10-11 DIAGNOSIS — K51914 Ulcerative colitis, unspecified with abscess: Secondary | ICD-10-CM

## 2014-10-11 DIAGNOSIS — E871 Hypo-osmolality and hyponatremia: Secondary | ICD-10-CM

## 2014-10-11 DIAGNOSIS — K51919 Ulcerative colitis, unspecified with unspecified complications: Secondary | ICD-10-CM

## 2014-10-11 DIAGNOSIS — N390 Urinary tract infection, site not specified: Secondary | ICD-10-CM

## 2014-10-11 DIAGNOSIS — K5100 Ulcerative (chronic) pancolitis without complications: Secondary | ICD-10-CM | POA: Diagnosis present

## 2014-10-11 HISTORY — PX: FLEXIBLE SIGMOIDOSCOPY: SHX5431

## 2014-10-11 LAB — BASIC METABOLIC PANEL
ANION GAP: 8 (ref 5–15)
BUN: <5 mg/dL — ABNORMAL LOW (ref 6–20)
CALCIUM: 7.8 mg/dL — AB (ref 8.9–10.3)
CO2: 25 mmol/L (ref 22–32)
CREATININE: 0.52 mg/dL (ref 0.44–1.00)
Chloride: 99 mmol/L — ABNORMAL LOW (ref 101–111)
Glucose, Bld: 102 mg/dL — ABNORMAL HIGH (ref 65–99)
Potassium: 3.5 mmol/L (ref 3.5–5.1)
SODIUM: 132 mmol/L — AB (ref 135–145)

## 2014-10-11 LAB — CBC
HCT: 25.6 % — ABNORMAL LOW (ref 36.0–46.0)
HEMOGLOBIN: 8.2 g/dL — AB (ref 12.0–15.0)
MCH: 29.7 pg (ref 26.0–34.0)
MCHC: 32 g/dL (ref 30.0–36.0)
MCV: 92.8 fL (ref 78.0–100.0)
PLATELETS: 247 10*3/uL (ref 150–400)
RBC: 2.76 MIL/uL — AB (ref 3.87–5.11)
RDW: 16.9 % — ABNORMAL HIGH (ref 11.5–15.5)
WBC: 7.5 10*3/uL (ref 4.0–10.5)

## 2014-10-11 LAB — C DIFFICILE QUICK SCREEN W PCR REFLEX
C DIFFICILE (CDIFF) INTERP: NEGATIVE
C Diff antigen: NEGATIVE
C Diff toxin: NEGATIVE

## 2014-10-11 LAB — GLUCOSE, CAPILLARY
GLUCOSE-CAPILLARY: 96 mg/dL (ref 65–99)
Glucose-Capillary: 128 mg/dL — ABNORMAL HIGH (ref 65–99)

## 2014-10-11 SURGERY — SIGMOIDOSCOPY, FLEXIBLE
Anesthesia: Monitor Anesthesia Care

## 2014-10-11 MED ORDER — SODIUM CHLORIDE 0.9 % IV SOLN: INTRAVENOUS | Status: DC

## 2014-10-11 MED ORDER — PROPOFOL INFUSION 10 MG/ML OPTIME
INTRAVENOUS | Status: DC | PRN
  Administered 2014-10-11: 50 ug/kg/min via INTRAVENOUS

## 2014-10-11 MED ORDER — PHENYLEPHRINE HCL 10 MG/ML IJ SOLN
INTRAMUSCULAR | Status: DC | PRN
  Administered 2014-10-11: 80 ug via INTRAVENOUS

## 2014-10-11 MED ORDER — PROPOFOL 10 MG/ML IV BOLUS
INTRAVENOUS | Status: DC | PRN
  Administered 2014-10-11: 40 mg via INTRAVENOUS

## 2014-10-11 MED ORDER — LACTATED RINGERS IV SOLN
INTRAVENOUS | Status: DC | PRN
  Administered 2014-10-11: 13:00:00 via INTRAVENOUS

## 2014-10-11 NOTE — Anesthesia Preprocedure Evaluation (Addendum)
Anesthesia Evaluation  Patient identified by MRN, date of birth, ID band Patient awake  General Assessment Comment:CAD (coronary artery disease)     unspecified . Cerebrovascular disease  . Hyperlipidemia, mixed  . HTN (hypertension)  . Ulcerative colitis  . Diverticulitis  . Colon polyps 2006 . Thyroid disease  . Myocardial infarct  . Carotid artery occlusion  . Diabetes mellitus without complication    Borderline  Reviewed: Allergy & Precautions, NPO status , Patient's Chart, lab work & pertinent test results  History of Anesthesia Complications Negative for: history of anesthetic complications  Airway Mallampati: II  TM Distance: >3 FB Neck ROM: Full    Dental no notable dental hx. (+) Dental Advisory Given, Edentulous Upper, Edentulous Lower   Pulmonary former smoker,  breath sounds clear to auscultation  Pulmonary exam normal       Cardiovascular hypertension, Pt. on medications + CAD, + Past MI and + Peripheral Vascular Disease Normal cardiovascular examRhythm:Regular Rate:Normal     Neuro/Psych negative neurological ROS  negative psych ROS   GI/Hepatic Neg liver ROS, GERD-  Medicated and Controlled,  Endo/Other  diabetes  Renal/GU negative Renal ROS  negative genitourinary   Musculoskeletal  (+) Arthritis -,   Abdominal   Peds negative pediatric ROS (+)  Hematology negative hematology ROS (+)   Anesthesia Other Findings   Reproductive/Obstetrics negative OB ROS                          Anesthesia Physical Anesthesia Plan  ASA: III  Anesthesia Plan: MAC   Post-op Pain Management:    Induction: Intravenous  Airway Management Planned: Nasal Cannula  Additional Equipment:   Intra-op Plan:   Post-operative Plan:   Informed Consent: I have reviewed the patients History and Physical, chart, labs and discussed the procedure  including the risks, benefits and alternatives for the proposed anesthesia with the patient or authorized representative who has indicated his/her understanding and acceptance.   Dental advisory given and Dental Advisory Given  Plan Discussed with: CRNA and Anesthesiologist  Anesthesia Plan Comments:        Anesthesia Quick Evaluation

## 2014-10-11 NOTE — Evaluation (Signed)
Physical Therapy Evaluation Patient Details Name: [name] MRN: [id number] DOB: 1940-07-17 Today's Date: 10/11/2014   History of Present Illness  Patient is a 74 yo female who presents with persistent colitis. recently admitted on 09/29/14 from [facility] due to rectal bleeding.  B Pt also has diverting ileostomy.  PMH inculdes ulcerative colitis, CVA, (B) DVTs, perirectal abscess.   Clinical Impression  Patient demonstrates deficits in functional mobility as indicated below. Will need continued skilled PT to address deficits and maximize function. Will see as indicated and progress as tolerated.    Follow Up Recommendations SNF;Supervision/Assistance - 24 hour    Equipment Recommendations  None recommended by PT    Recommendations for Other Services       Precautions / Restrictions Precautions Precautions: Fall Precaution Comments: ostomy, sacral wound Restrictions Weight Bearing Restrictions: No      Mobility  Bed Mobility Overal bed mobility: Needs Assistance Bed Mobility: Supine to Sit     Supine to sit: Mod assist;+2 for physical assistance     General bed mobility comments: VCs for positioning, assist to elevate trunk adn rotate to EOB  Transfers Overall transfer level: Needs assistance Equipment used: 2 person hand held assist     Stand pivot transfers: Max assist;+2 physical assistance       General transfer comment: VCs for positioning and encouragement to relax, max assist of two to pivot to chair. Manual positioning required for LEs  Ambulation/Gait                Stairs            Wheelchair Mobility    Modified Rankin (Stroke Patients Only)       Balance Overall balance assessment: Needs assistance Sitting-balance support: Bilateral upper extremity supported Sitting balance-Leahy Scale: Poor Sitting balance - Comments: Moderate assist initially, improved to min assist with intermittent period of min guard Postural  control: Posterior lean Standing balance support: Bilateral upper extremity supported Standing balance-Leahy Scale: Zero                               Pertinent Vitals/Pain Pain Assessment: Faces Pain Score: 7  (abdominal) Faces Pain Scale: Hurts little more Pain Location: buttocks and abdominal region ( abdominal region is a 7/10 value  Pain Descriptors / Indicators: Aching;Discomfort;Grimacing;Guarding Pain Intervention(s): Limited activity within patient's tolerance;Monitored during session;Repositioned    Home Living Family/patient expects to be discharged to:: Unsure Living Arrangements: Other (Comment) (pt. came from SNF)             Home Equipment: Walker - 2 wheels      Prior Function Level of Independence: Needs assistance   Gait / Transfers Assistance Needed: Daughter states it has been a couple of months since patient able to walk.  Staff at [facility] reportedly have been using lift equipent to get pt. to a reclining type chair.  Daughte says pt. does not currently have a cushion when she sits up     Comments: pt states she was independent and 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";  chart states she amb with RW     Hand Dominance   Dominant Hand: Right    Extremity/Trunk Assessment   Upper Extremity Assessment: Generalized weakness           Lower Extremity Assessment: Generalized weakness         Communication   Communication: No difficulties  Cognition Arousal/Alertness: Awake/alert  Behavior During Therapy: Anxious Overall Cognitive Status: History of cognitive impairments - at baseline                      General Comments      Exercises        Assessment/Plan    PT Assessment Patient needs continued PT services  PT Diagnosis Generalized weakness;Difficulty walking;Acute pain   PT Problem List Decreased strength;Decreased activity tolerance;Decreased mobility;Decreased cognition;Pain  PT Treatment  Interventions DME instruction;Gait training;Functional mobility training;Therapeutic activities;Therapeutic exercise;Patient/family education   PT Goals (Current goals can be found in the Care Plan section) Acute Rehab PT Goals Patient Stated Goal: pt. able to state that she does want to walk again PT Goal Formulation: With patient Time For Goal Achievement: 10/14/14 Potential to Achieve Goals: Fair    Frequency Min 2X/week   Barriers to discharge        Co-evaluation               End of Session Equipment Utilized During Treatment: Gait belt Activity Tolerance: Patient limited by pain;Patient limited by fatigue Patient left: in chair;with call bell/phone within reach;with family/visitor present Nurse Communication: Mobility status         Time: 3419-3790 PT Time Calculation (min) (ACUTE ONLY): 26 min   Charges:   PT Evaluation $Initial PT Evaluation Tier I: 1 Procedure PT Treatments $Therapeutic Activity: 8-22 mins   PT G CodesDuncan Dull 26-Oct-2014, 8:49 AM Alben Deeds, PT DPT  [phone]

## 2014-10-11 NOTE — Progress Notes (Signed)
Utilization review completed. Takyla Kuchera, RN, BSN. 

## 2014-10-11 NOTE — Interval H&P Note (Signed)
History and Physical Interval Note:  10/11/2014 1:06 PM  [name]  has presented today for surgery, with the diagnosis of rectal bleeding, colitis worse per CT  The various methods of treatment have been discussed with the patient and family. After consideration of risks, benefits and other options for treatment, the patient has consented to  Procedure(s): FLEXIBLE SIGMOIDOSCOPY (N/A) as a surgical intervention .  The patient's history has been reviewed, patient examined, no change in status, stable for surgery.  I have reviewed the patient's chart and labs.  Questions were answered to the patient's satisfaction.  Daughter in room.   Scarlette Shorts

## 2014-10-11 NOTE — Op Note (Signed)
[facility] [place], [id number]   FLEXIBLE SIGMOIDOSCOPY PROCEDURE REPORT  PATIENT: [name], [name]  MR#: [id number] BIRTHDATE: [birth year] , 40  yrs. old GENDER: female ENDOSCOPIST: Eustace Quail, MD REFERRED BY: [facility] PROCEDURE DATE:  10/11/2014 PROCEDURE:   Sigmoidoscopy with biopsy ASA CLASS:   Class III INDICATIONS:hematochezia. MEDICATIONS: Monitored anesthesia care and Per Anesthesia  DESCRIPTION OF PROCEDURE:   After the risks benefits and alternatives of the procedure were thoroughly explained, informed consent was obtained.  Digital exam revealed no abnormalities of the rectum. The pentax colo [id number]  endoscope was introduced through the anus  and advanced to the splenic flexure , The exam was Without limitations.    The quality of the prep was The overall prep quality was good. . Estimated blood loss is zero unless otherwise noted in this procedure report. The instrument was then slowly withdrawn as the mucosa was fully examined.     COLON FINDINGS: The colonic mucosa revealed mild and moderately active colitis.  More mild changes noted in the rectum.  Moderate changes noted in the sigmoid colon and proximal descending colon. Multiple biopsies taken.    Retroflexion was not performed due to a narrow rectal vault.    The scope was then withdrawn from the patient and the procedure terminated.  COMPLICATIONS: There were no immediate complications.  ENDOSCOPIC IMPRESSION: 1. Mild-to-moderate active colitis as described. Suspect IBD. Multiple biopsies taken  RECOMMENDATIONS: 1. Await biopsy results . If no unexpected findings (such as CMV), would proceed with next scheduled dose of Remicade at this time. May need to decrease interval of biologic therapy given hypoalbuminemia. 2. Discussed with patient and daughter. GI will  continue to follow until biopsy results so that we can make final recommendations  and follow-up plans  REPEAT EXAM:  eSigned:  Eustace Quail, MD 10/11/2014 2:07 PM   KR:[id number] Deatra Ina, MD The Patient

## 2014-10-11 NOTE — Anesthesia Postprocedure Evaluation (Signed)
  Anesthesia Post-op Note  Patient: [name]  Procedure(s) Performed: Procedure(s) (LRB): FLEXIBLE SIGMOIDOSCOPY (N/A)  Patient Location: PACU  Anesthesia Type: MAC  Level of Consciousness: awake and alert   Airway and Oxygen Therapy: Patient Spontanous Breathing  Post-op Pain: mild  Post-op Assessment: Post-op Vital signs reviewed, Patient's Cardiovascular Status Stable, Respiratory Function Stable, Patent Airway and No signs of Nausea or vomiting  Last Vitals:  Filed Vitals:   10/11/14 1406  BP:   Pulse: 86  Temp:   Resp: 11    Post-op Vital Signs: stable   Complications: No apparent anesthesia complications

## 2014-10-11 NOTE — Progress Notes (Signed)
[facility] PROGRESS NOTE  [name] BJS:[id number] DOB: 1940/03/05 DOA: 10/10/2014 PCP: Kandice Hams, MD  Assessment/Plan: #1 ulcerative colitis flare Per CT scan and per flexible sigmoidoscopy done 10/11/2014. Biopsy pending from flexible sigmoidoscopy. Per GI.  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 Patient has been seen in consultation by general surgery who recommended continuing twice a day dressing changes to the buttocks wound and continue routine ostomy care. Patient need to follow-up with general surgery as outpatient.  #3 questionable UTI Urine cultures pending. Continue IV Rocephin.  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 Patient is status post IVC filter placement.  #5 severe protein calorie malnutrition Continue nutritional supplements.  #6 hyponatremia Likely secondary to volume depletion. Continue IV fluids. Follow.  #7 hypertension Stable.   #8 severe deconditioning PT. Patient was in nursing facility prior to admission.  Code Status: Full Family Communication: Updated patient and daughter at bedside. Disposition Plan: Once clinical improvement will likely need to go back to SNF. Per gastroenterology.   Consultants:  Gastroenterology: Dr. Henrene Pastor 10/10/2014  Gen. surgery: Dr. Marlou Starks 10/10/2014  Procedures:  CT abdomen and pelvis 10/10/2014  Flexible sigmoidoscopy Dr. Henrene Pastor 10/11/2014  Antibiotics:  IV Rocephin 10/10/2014  HPI/Subjective: Patient denies any nausea or emesis. Some improvement with abdominal pain.  Objective: Filed Vitals:   10/11/14 1430  BP: 147/84  Pulse: 89  Temp:   Resp: 11    Intake/Output Summary (Last 24 hours) at 10/11/14 1940 Last data filed at 10/11/14 1700  Gross per 24 hour  Intake    640 ml  Output    451 ml  Net    189 ml   There were no  vitals filed for this visit.  Exam:   General:  NAD  Cardiovascular: RRR  Respiratory: CTAB  Abdomen: Soft, diffuse tenderness to palpation, positive bowel sounds, no rebound, no guarding  Musculoskeletal: No clubbing cyanosis or edema.  Data Reviewed: Basic Metabolic Panel:  Recent Labs Lab 10/10/14 0615 10/11/14 0349  NA 133* 132*  K 3.5 3.5  CL 101 99*  CO2 24 25  GLUCOSE 105* 102*  BUN <5* <5*  CREATININE 0.49 0.52  CALCIUM 8.4* 7.8*   Liver Function Tests:  Recent Labs Lab 10/10/14 0615  AST 37  ALT 31  ALKPHOS 86  BILITOT 0.7  PROT 5.5*  ALBUMIN 2.2*    Recent Labs Lab 10/10/14 0615  LIPASE 27   No results for input(s): AMMONIA in the last 168 hours. CBC:  Recent Labs Lab 10/06/14 1222 10/07/14 0444 10/10/14 0615 10/11/14 0349  WBC 10.3 8.8 11.5* 7.5  NEUTROABS  --   --  8.9*  --   HGB 9.1* 8.4* 9.5* 8.2*  HCT 28.7* 26.4* 29.7* 25.6*  MCV 92.6 92.3 92.0 92.8  PLT 200 180 218 247   Cardiac Enzymes: No results for input(s): CKTOTAL, CKMB, CKMBINDEX, TROPONINI in the last 168 hours. BNP (last 3 results) No results for input(s): BNP in the last 8760 hours.  ProBNP (last 3 results) No results for input(s): PROBNP in the last 8760 hours.  CBG:  Recent Labs Lab 10/06/14 1617 10/07/14 0753 10/07/14 1121 10/11/14 1157 10/11/14 1632  GLUCAP 109* 89 114* 96 128*    Recent Results (from the past 240 hour(s))  Surgical pcr screen     Status: None   Collection Time: 10/10/14  3:23 PM  Result Value Ref Range Status   MRSA, PCR NEGATIVE NEGATIVE Final   Staphylococcus aureus NEGATIVE NEGATIVE Final    Comment:        The Xpert SA Assay (FDA approved for NASAL specimens in patients over 18 years of age), is one component of a comprehensive surveillance program.  Test performance has been validated by [facility] for patients greater than or equal to 42 year old. It is not intended to diagnose infection nor to guide or monitor  treatment.   C difficile quick scan w PCR reflex     Status: None   Collection Time: 10/11/14  4:17 AM  Result Value Ref Range Status   C Diff antigen NEGATIVE NEGATIVE Final   C Diff toxin NEGATIVE NEGATIVE Final   C Diff interpretation Negative for toxigenic C. difficile  Final     Studies: Ct Abdomen Pelvis Wo Contrast  10/10/2014   CLINICAL DATA:  Ulcerative colitis and recent rectal abscess. Abdominal pain and bloody stools.  EXAM: CT ABDOMEN AND PELVIS WITHOUT CONTRAST  TECHNIQUE: Multidetector CT imaging of the abdomen and pelvis was performed following the standard protocol without IV contrast.  COMPARISON:  09/29/2014  FINDINGS: BODY WALL: A presumably packed defect in the right gluteal fold which tracks to the thickened right levator is more distended than before. There is no tracking subcutaneous gas or evidence of undrained fluid collection.  Ostomy in the right lower quadrant for loop ileostomy.  LOWER CHEST: Extensive coronary atherosclerosis, status post CABG. Prominent calcification of the aortic root wall with apparent dilatation at least partly related to obliquity of imaging.  ABDOMEN/PELVIS:  Liver: Subtle undulation the liver surface but no other morphologic changes of cirrhosis.  Biliary: Cholecystectomy. Common bile duct enlargement is chronic and stable.  Pancreas: Stable borderline dilatation of the main pancreatic duct at 3 mm. No active inflammatory changes.  Spleen: Unremarkable.  Adrenals: Unremarkable.  Kidneys and ureters: Hilar calcifications are arterial. Bilateral lobulated renal cortex consistent with scarring on postcontrast imaging comparison. 22 mm left renal cyst. No hydronephrosis.  Bladder: Decompressed by a Foley catheter.  Reproductive: Hysterectomy.  Negative adnexa.  Pelvic floor laxity.  Bowel: Pan colonic thickening even when accounting for underdistention with submucosal low density either from edema and/or fat. Mild haziness of the surrounding fat confirms  inflammation. No small bowel obstruction or inflammatory change. No appendicitis.  Retroperitoneum: No mass or adenopathy.  Peritoneum: No ascites or pneumoperitoneum.  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  OSSEOUS: No acute finding. Lower lumbar facet arthropathy with low grade 1 anterolisthesis at L4-5.  IMPRESSION: 1. Pancolitis which could be active ulcerative colitis or infectious. 2. Packed right perianal and ischiorectal fossa abscess. No evidence of undrained fluid collection.   Electronically Signed   By: Monte Fantasia M.D.   On: 10/10/2014 08:03    Scheduled Meds: . cefTRIAXone (ROCEPHIN)  IV  1 g Intravenous Q24H  . feeding supplement  1 Container Oral TID BM  . rosuvastatin  40 mg Oral QHS  . saccharomyces boulardii  250 mg Oral BID  . sertraline  50 mg Oral Daily   Continuous Infusions: . sodium chloride 75 mL/hr at 10/11/14 1518    Principal Problem:   Ulcerative colitis Active Problems:   Essential hypertension   Hyponatremia   Abscess, perirectal s/p I&D 08/23/2014   UTI (lower urinary tract infection)    Time spent: 18 minutes    THOMPSON,DANIEL M.D. [facility] Pager [phone]. If 7PM-7AM, please  contact night-coverage at [email or web address], password [facility] 10/11/2014, 7:40 PM  LOS: 1 day

## 2014-10-11 NOTE — Anesthesia Procedure Notes (Signed)
Procedure Name: MAC Date/Time: 10/11/2014 1:21 PM Performed by: Neldon Newport Pre-anesthesia Checklist: Timeout performed, Patient being monitored, Suction available, Emergency Drugs available and Patient identified Patient Re-evaluated:Patient Re-evaluated prior to inductionOxygen Delivery Method: Nasal cannula

## 2014-10-11 NOTE — Transfer of Care (Signed)
Immediate Anesthesia Transfer of Care Note  Patient: [name]  Procedure(s) Performed: Procedure(s): FLEXIBLE SIGMOIDOSCOPY (N/A)  Patient Location: PACU  Anesthesia Type:MAC  Level of Consciousness: awake, alert  and oriented  Airway & Oxygen Therapy: Patient Spontanous Breathing and Patient connected to nasal cannula oxygen  Post-op Assessment: Report given to RN, Post -op Vital signs reviewed and stable and Patient moving all extremities X 4  Post vital signs: Reviewed and stable  Last Vitals:  Filed Vitals:   10/11/14 1257  BP: 107/65  Pulse:   Temp:   Resp: 15    Complications: No apparent anesthesia complications

## 2014-10-11 NOTE — Progress Notes (Signed)
Initial Nutrition Assessment  DOCUMENTATION CODES:   Severe malnutrition in context of acute illness/injury, Obesity unspecified  INTERVENTION:   -RD will follow for diet advancement and supplement diet as appopriate  NUTRITION DIAGNOSIS:   Inadequate oral intake related to altered GI function as evidenced by NPO status.  GOAL:   Patient will meet greater than or equal to 90% of their needs  MONITOR:   PO intake, Supplement acceptance, Diet advancement, Labs, Weight trends, Skin, I & O's  REASON FOR ASSESSMENT:   Malnutrition Screening Tool    ASSESSMENT:   [name] is a 74 y.o. female with a history of ulcerative colitis with multiple hospitalizations over the past several months, having a history of perianal abscess status post debridement of perianal wound with laparoscopic diverticula ileostomy performed on 09/13/2014, recently admitted to the medicine service on 09/29/2014 and discharged on 10/07/2014 at which time she presented with rectal bleeding. During that hospitalization she was seen by GI and underwent colonoscopy on 10/04/2014, found to have mild to moderate mucosal erythema throughout left colon with small amount of old blood. There is no active bleeding seen. She was discharged to skilled nursing facility on prednisone taper. She has been treated with several courses of antimicrobial therapy and steroids over the last several months. Today she was transferred from her skilled nursing facility to the emergency department for further workup of lower abdominal pain associate with episode of bloody stools. Patient remains significantly deconditioned, unable to ambulate, requiring assistance with most activities of daily living. She was worked up with a CT scan of abdomen and pelvis that revealed pancolitis could be consistent with active ulcerative colitis or infectious per radiology. I discussed case with Theodis Aguas of [facility] who will consult.  Pt  admitted with ulcerative colitis.   Pt is very familiar to Clinical RD team due to multiple recent admissions.   Pt with multiple wounds. Per RN wound assessment, pt with dehisced rt rectal wound, stage I pressure ulcer to lt buttocks, stage II pressure ulcer to buttocks, and a perineal/rectal incision.   Pt with RLQ ileostomy; COWRN following.   Unable to complete nutrition-focused physical exam at this time. However, exam completed on 09/30/14- findings were mild to moderate fat depletion, mild to moderate muscle depletion, and no edema. RD suspects no changes to exam at this time.  RD will add supplements once diet is advanced.  Labs reviewed: Na: 132 (on IV supplementation).   Diet Order:  Diet NPO time specified  Skin:  Wound (see comment) (dehisced retal wound, stage II buttocks, skin tear rt/lt but)  Last BM:  10/11/14  Height:   Ht Readings from Last 1 Encounters:  09/29/14 5\' 9"  (1.753 m)    Weight:   Wt Readings from Last 1 Encounters:  10/06/14 212 lb 1.6 oz (96.208 kg)    Ideal Body Weight:  65.9 kg  BMI:  Estimated body mass index is 31.31 kg/(m^2) as calculated from the following:   Height as of 09/29/14: 5\' 9"  (1.753 m).   Weight as of 09/29/14: 212 lb 1.6 oz (96.208 kg).  Estimated Nutritional Needs:   Kcal:  2100-2300  Protein:  105-115 grams  Fluid:  >2.1 L  EDUCATION NEEDS:   Education needs addressed  Valrie Jia A. Jimmye Norman, RD, LDN, CDE Pager: [phone] After hours Pager: [phone]

## 2014-10-11 NOTE — H&P (View-Only) (Signed)
[facility] Consult: 9:20 AM 10/10/2014     Referring Provider: [place] in ED, Coralyn Pear from [facility].  Primary Care Physician:  Kandice Hams, MD Primary Gastroenterologist:  Dr. Deatra Ina     Reason for Consultation: bleeding from rectal abscess/fistula.     HPI: [name] is a 74 y.o. female. PMH CAD, MI, CABG 2006, bil carotid artery disease/sp CEA, glucose intolerance/"prediabetes". Hx CVA, s/p IVC filter. Depression, anxiety/  Left sided UC, diagnosed 2006. Did well on Lialda 4.8 grams daily.  Colonoscopy Sept 2014: scattered left sided tics and focal left sided colitis, mild cryptitis on Bx.   Admissions for worsening UC, bloody diarrhea, starting 6/20 - 08/21/14. Treated with proctofoam, probiotic, steroids, briefly on vancomycin po and IV Flagyl. 08/08/14 Flex Sig: diffuse edema to 40 cm, pseudomembranes over inflamed but mostly normal tissue 40 to 70 cm.  Pathology of descending/sigmoid: moderately active chronic colitis c/w IBD.  Path on sigmoid/rectum: moderate to severe active chronic colitis. No evidence of C diff. C diff PCR negative 08/10/14. Discharged on prednisone 20 mg daily, lialda 4.8 grams daily, Florastor .  Readmitted 7/5 - 09/21/14 with perirectal abscess, escalating rectal pain. Developed DVT, s/p IVC filter 7/13. Required nutritional support with TNA but tolerating po diet at discharge.  08/22/14 CT abdomen pelvis with contrast showing extensive gas in the right gluteal fat and musculature extending throughout the retroperitoneum. Thickening, consistent with colitis in the distal transverse and descending colon. 08/23/14: I & D of complex perirectal abscess.  09/06/14 Flex Sig: severe left sided colitis from anal canal to splenic flexure/80 cm. Bleeding from large ulcerations in left  colon.  09/13/14: Laparoscopic diverting ileostomy, exam under anesthesia. Normal TI and cecum. 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  Remicade initiated 7/15, 2nd dose 8/2, 3rd dose due 9/1.  Discharged to SNF on Augmentin, Lialda, florastor, prednisone taper through 8/25.  Infectious disease MD felt she would need another 2 to 3 weeks of abx.   Admission 8/12- 10/07/14 with rectal bleeding, pinkinsh discharge from abscess cavity. During admission some dark/blood tinged ostomy output notes.  09/29/14 CT scan without mention of colitis, abscess.  10/04/14 Flex Sig to mid sigmoid: mild colitis, small amount old blood but not active bleeding. Continued on Prednisone taper. Grew pseudomonas from urine, Zosyn replaced Augmentin during admission. Dr Hale Bogus opinion was that this was asymptomatic bacteruria related to pt indwelling foley which did not need treatement.  He stopped Zosyn, Fluconazole, advising observation off anti-infectious meds.  Discharged back to SNF.  Pt was no show for gen surg ROV yesterday 8/22 with Dr Barry Dienes.  Has 9/1 Remicade infusion and GI ROV set for 10/31/14.   Sent again from SNF with bleeding from either the abscess cavity or the rectum.  As for abdominal pain, pt says it is stable, in lower abdomen/pelvic region, and not any worse.  No N/V.  Says appetite is fair.  No chills or fever.  Is very sad and unhappy.  Weakness/fatigue is chronic for last  many weeks. No chest pain, heart burn, dyspnea.    10/10/14 CT scan now showing pan colitis now extending into fat planes, packing in place at tight ischiorectal abscess fossa.  Overall this is a change and worsening picture c/w CT of 11 days ago.   Labs: Hgb 9.5, was 8.4 on 8/20.  WBCs to 11.5. Na still low at 133, improved from 129 6 days ago. No renal dysfunction. Coags normal as per usual.   She has a chronic indwelling Foley catheter in place. No blood is seen in the  Foley.   Past Medical History  Diagnosis Date  . CAD (coronary artery disease)     unspecified  . Cerebrovascular disease 2010    bil carotid artery disease. left CEA 02/2004.   Marland Kitchen Hyperlipidemia, mixed   . HTN (hypertension)   . Ulcerative colitis 2002  . Colon polyps 2006  . Thyroid disease   . Myocardial infarct 1998  . Carotid artery occlusion 2010  . Diabetes mellitus without complication     Borderline  . GERD (gastroesophageal reflux disease)   . Arthritis   . 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     Past Surgical History  Procedure Laterality Date  . Cholecystectomy  7.06.2008    with cholangiogram  . 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 Abdominal hysterectomy    . Colonoscopy  multiple  . Sigmoidoscopy  multiple  . Flexible sigmoidoscopy N/A 08/08/2014    Procedure: FLEXIBLE SIGMOIDOSCOPY;  Surgeon: Gatha Mayer, MD;  Location: [facility];  Service: Endoscopy;  Laterality: N/A;  . Incision and drainage perirectal abscess N/A 08/22/2014    Procedure: IRRIGATION AND DEBRIDEMENT PERIRECTAL ABSCESS;  Surgeon: Jackolyn Confer, MD;  Location: [facility];  Service: General;  Laterality: N/A;  . Joint replacement  left knee  . Flexible sigmoidoscopy N/A 09/06/2014    Procedure: FLEXIBLE SIGMOIDOSCOPY;  Surgeon: Lafayette Dragon, MD;  Location: [facility];  Service: Endoscopy;  Laterality: N/A;  . Laparoscopic diverted colostomy N/A 09/13/2014    Procedure: LAPAROSCOPIC DIVERTED ILEOSTOMY;  Surgeon: Stark Klein, MD;  Location: [facility];  Service: General;  Laterality: N/A;  . Rectal exam under anesthesia N/A 09/13/2014    Procedure: RECTAL EXAM UNDER ANESTHESIA  AND DEBRIDEMENT OF PERIANAL WOUND;  Surgeon: Stark Klein, MD;  Location: [facility];  Service: General;  Laterality: N/A;  . Flexible sigmoidoscopy N/A 10/04/2014    Procedure: Otho Darner SIGMOIDOSCOPY;  Surgeon: Inda Castle, MD;  Location: [facility];  Service: Endoscopy;  Laterality: N/A;    Prior to  Admission medications   Medication Sig Start Date End Date Taking? Authorizing Provider  acetaminophen (TYLENOL) 500 MG tablet Take 2 tablets (1,000 mg total) by mouth every 4 (four) hours as needed for mild pain or moderate pain. 08/28/14  Yes Robbie Lis, MD  ALPRAZolam Duanne Moron) 0.5 MG tablet Take 1 tablet (0.5 mg total) by mouth 2 (two) times daily as needed for anxiety. 10/07/14  Yes Velvet Bathe, MD  CRESTOR 40 MG tablet TAKE 1 TABLET AT BEDTIME 05/17/14  Yes Lelon Perla, MD  feeding supplement (BOOST / RESOURCE BREEZE) LIQD Take 1 Container by mouth 3 (three) times daily between meals. Patient taking differently: Take 1 Container by mouth 3 (three) times daily between meals. 9am, 2pm, 6pm 09/21/14  Yes Albertine Patricia, MD  mesalamine (LIALDA) 1.2 G EC tablet take 4 tablets by mouth once daily Patient taking differently: Take 4.8 g by mouth daily.  07/31/14  Yes Inda Castle, MD  predniSONE (DELTASONE) 1 MG tablet Take 1 tablet (1 mg total) by mouth daily with breakfast. For 3 doses Patient taking differently: Take 1 mg by mouth daily with breakfast. For 3 doses: 10/06/14, 10/07/14, 10/08/14 10/06/14  Yes Dawood S Elgergawy, MD  psyllium (HYDROCIL/METAMUCIL) 95 % PACK Take 1 packet by mouth 2 (two) times daily. Patient taking differently: Take 1 packet by mouth 2 (two) times daily. Mix in 6-8 oz of liquid and drink 08/28/14  Yes Robbie Lis, MD  saccharomyces boulardii (FLORASTOR) 250 MG capsule Take 1 capsule (250 mg total) by mouth 2 (two) times daily. 08/03/14  Yes Amy S [name], PA-C  sertraline (ZOLOFT) 50 MG tablet Take 1 tablet (50 mg total) by mouth daily. 09/27/14  Yes Lauree Chandler, NP  predniSONE (DELTASONE) 1 MG tablet Take 0.5 tablets (0.5 mg total) by mouth daily with breakfast. For 3 doses Patient taking differently: Take 0.5 mg by mouth daily with breakfast. For 3 doses: 10/09/14, 10/10/14, 10/11/14 10/09/14   Albertine Patricia, MD    Scheduled Meds:  Infusions:  PRN  Meds:    Allergies as of 10/10/2014 - Review Complete 10/10/2014  Allergen Reaction Noted  . Clindamycin/lincomycin Diarrhea 08/07/2014  . Ivp dye [iodinated diagnostic agents] Other (See Comments) 01/14/2011    Family History  Problem Relation Age of Onset  . Colitis Brother     1 bro with Crohn's, another bro with unspecified colitis.   . Stomach cancer Mother   . Stroke Father     died  . Colon cancer Neg Hx     Social History   Social History  . Marital Status: Married    Spouse Name: N/A  . Number of Children: N/A  . Years of Education: N/A   Occupational History  . retired    Social History Main Topics  . Smoking status: Former Smoker -- 0.30 packs/day for 40 years    Types: Cigarettes    Quit date: 02/17/2013  . Smokeless tobacco: Never Used  . Alcohol Use: No  . Drug Use: No  . Sexual Activity: Not on file   Other Topics Concern  . Not on file   Social History Narrative    REVIEW OF SYSTEMS: Constitutional:  Pt requiring full assist of staff and hoist for moving bed to chair.  Unable to stand even with assist  ENT:  No nose bleeds Pulm:  No SOB or cough.  CV:  No palpitations, no LE edema.  GU:  No hematuria, no frequency GI:  Per HPI Heme: Other than the rectal/perirectal bleeding, there has been no unusual bleeding from other locations on the body.  Transfusions: No records of previous blood transfusions. Neuro:  No headaches, no peripheral tingling or numbness Derm:  No itching, no rash or sores.  Endocrine:  No sweats or chills.  No polyuria or dysuria Immunization:  Not queried.  Travel:  None as has bee inpt or at SNF in last 3 months.    PHYSICAL EXAM: Vital signs in last 24 hours: Filed Vitals:   10/10/14 0815  BP: 123/85  Pulse: 80  Temp:   Resp:    Wt Readings from Last 3 Encounters:  10/06/14 212 lb 1.6 oz (96.208 kg)  09/21/14 217 lb 13 oz (98.8 kg)  08/07/14 229 lb 12.8 oz (104.237 kg)   General: Frail, unhappy and  moaningtearful at times during exam, no acute physical distress,  Look ill but not toxic.  Head: No facial asymmetry or edema. No signs of head trauma.  Eyes: No scleral icterus, no conjunctival pallor. EOMI. Ears: No hearing deficit  Nose: No congestion or discharge. No sneezing. Mouth: Mucous membranes moist and clear.  Tongue midline. Edentulous. No signs of Candida. Neck: No JVD, no masses, no TMG. Lungs: No labored breathing or cough. Clear to auscultation bilaterally with good breath sounds. Heart: RRR. No MRG. Abdomen: liquid medium brown stool in ostomy. mild tenderness across lower to mid abdomen, no guard or rebound.   Rectal: Gauze packing noted in the perirectal abscess cavity with small to moderate amount of fresh blood. Digital exam of rectum notable for tenderness, no mass and spec of red blood which may be contaminant from peri rectal exam. No fluctuance. sacral pressure bandage was not removed.   GU: foley in place.  Musc/Skeltl: No joint erythema swelling or contracture. Extremities: No CCE. Feet are warm.  Neurologic: Patient oriented to place, year and situation. Moves all 4 limbs. Limb strength is grossly 5 over 5 in upper and lower extremities. Skin: No telangiectasia. Other than the sacral skin breakdown, no other sores or rashes. Tattoos: None Nodes: No cervical adenopathy.  Psych: Cooperative, oriented 3. Alert, though a bit drowsy  Intake/Output from previous day:   Intake/Output this shift: Total I/O In: 500 [I.V.:500] Out: 400 [Urine:400]  LAB RESULTS:  Recent Labs  10/10/14 0615  WBC 11.5*  HGB 9.5*  HCT 29.7*  PLT 218   BMET Lab Results  Component Value Date   NA 133* 10/10/2014   NA 129* 10/04/2014   NA 130* 10/01/2014   K 3.5 10/10/2014   K 3.8 10/04/2014   K 4.8 10/01/2014   CL 101 10/10/2014   CL 102 10/04/2014   CL 103 10/01/2014   CO2 24 10/10/2014   CO2 20* 10/04/2014   CO2 20* 10/01/2014   GLUCOSE  105* 10/10/2014   GLUCOSE 86 10/04/2014   GLUCOSE 124* 10/01/2014   BUN <5* 10/10/2014   BUN 7 10/04/2014   BUN 9 10/01/2014   CREATININE 0.49 10/10/2014   CREATININE 0.64 10/04/2014   CREATININE 0.62 10/01/2014   CALCIUM 8.4* 10/10/2014   CALCIUM 8.0* 10/04/2014   CALCIUM 8.4* 10/01/2014   LFT  Recent Labs  10/10/14 0615  PROT 5.5*  ALBUMIN 2.2*  AST 37  ALT 31  ALKPHOS 86  BILITOT 0.7   PT/INR Lab Results  Component Value Date   INR 1.13 10/10/2014   INR 1.09 09/29/2014   INR 1.00 08/31/2014   Hepatitis Panel No results for input(s): HEPBSAG, HCVAB, HEPAIGM, HEPBIGM in the last 72 hours. C-Diff No components found for: CDIFF Lipase     Component Value Date/Time   LIPASE 27 10/10/2014 0615    Drugs of Abuse  No results found for: LABOPIA, COCAINSCRNUR, LABBENZ, AMPHETMU, THCU, LABBARB   RADIOLOGY STUDIES: Ct Abdomen Pelvis Wo Contrast  10/10/2014   CLINICAL DATA:  Ulcerative colitis and recent rectal abscess. Abdominal pain and bloody stools.  EXAM: CT ABDOMEN AND PELVIS WITHOUT CONTRAST  TECHNIQUE: Multidetector CT imaging of the abdomen and pelvis was performed following the standard protocol without IV contrast.  COMPARISON:  09/29/2014  FINDINGS: BODY WALL: A presumably packed defect in the right gluteal fold which tracks to the thickened right levator is more distended than before. There is no tracking subcutaneous gas or evidence of undrained fluid collection.  Ostomy in the right lower quadrant for loop ileostomy.  LOWER CHEST: Extensive coronary atherosclerosis, status post  CABG. Prominent calcification of the aortic root wall with apparent dilatation at least partly related to obliquity of imaging.  ABDOMEN/PELVIS:  Liver: Subtle undulation the liver surface but no other morphologic changes of cirrhosis.  Biliary: Cholecystectomy. Common bile duct enlargement is chronic and stable.  Pancreas: Stable borderline dilatation of the main pancreatic duct at 3 mm. No  active inflammatory changes.  Spleen: Unremarkable.  Adrenals: Unremarkable.  Kidneys and ureters: Hilar calcifications are arterial. Bilateral lobulated renal cortex consistent with scarring on postcontrast imaging comparison. 22 mm left renal cyst. No hydronephrosis.  Bladder: Decompressed by a Foley catheter.  Reproductive: Hysterectomy.  Negative adnexa.  Pelvic floor laxity.  Bowel: Pan colonic thickening even when accounting for underdistention with submucosal low density either from edema and/or fat. Mild haziness of the surrounding fat confirms inflammation. No small bowel obstruction or inflammatory change. No appendicitis.  Retroperitoneum: No mass or adenopathy.  Peritoneum: No ascites or pneumoperitoneum.  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  OSSEOUS: No acute finding. Lower lumbar facet arthropathy with low grade 1 anterolisthesis at L4-5.  IMPRESSION: 1. Pancolitis which could be active ulcerative colitis or infectious. 2. Packed right perianal and ischiorectal fossa abscess. No evidence of undrained fluid collection.   Electronically Signed   By: Monte Fantasia M.D.   On: 10/10/2014 08:03    ENDOSCOPIC STUDIES: Per HPI  IMPRESSION:   *  Complicated pt with 10 year hx UC.  Until 2 to 3 months ago this was stable on Lialda. But then developed severe colitis, rectal abscess and perirectal fistula and required diverting ileostomy (so question has colitis morphed into Crohn's?).   Flex sig last week showed notable improvement in her colitis.   Now with rapid bounce back to ED with abdominal pain (this is fairly chronic and hard to interpret) and CT with worsening colitis.   She is nearing terminus of a prolonged Prednisone taper and all antibiotics/antifungals discontinued by ID 10/05/14. ? Flare of IBD vs new infectious colitis (C diff though has tested negative for this twice in last 2 months) given recent exposure to Augmentin and Zosyn.     *  ?  UTI.  Would wait to treat with abx until specimen on CC urine is collected. May be time to reconsider the safety of the indwelling foley.  *  Depression.    PLAN:     *  Per Dr Henrene Pastor.  Not inclined to accelerate steroids just yet.  In fact, given paltry 0.5 mg dose of prednisone, would stop this altogether.    *  Obtain stool for C diff from ostomy.    *  If abx initiated, make sure urine clx on CC urine has been obtained first  *  Would have gen surgery see her while she is here, as she missed the appt with Dr Barry Dienes yesterday.  Also consider psych eval and input as her depression, understandably, is flaring.     [name]  10/10/2014, 9:20 AM Pager: [phone]  GI ATTENDING  History, laboratories, x-rays, prior endoscopy reports with images reviewed. Patient seen and examined. EXTREMELY COMPLICATED case of inflammatory bowel disease. Initially felt to be also colitis. Concerns about Crohn's given perirectal disease. Sent to the emergency room with "bleeding". She does have some blood in the packed surgical wound site. Seen by surgery who feels that she is doing well from that perspective. Flexible sigmoidoscopy just 6 days ago with minimal colitis. CT picture confusing somewhat. It may be artifact  from underdistention. Other possibilities include flare of inflammatory bowel disease, infection, ischemia, or diversion colitis. Probably the best way to try to sort this out would be repeat flexible sigmoidoscopy with biopsies if indicated. The patient is high-risk. We will set this up for tomorrow afternoon.  Docia Chuck. Geri Seminole., M.D. [facility] Division of Gastroenterology

## 2014-10-12 ENCOUNTER — Encounter (HOSPITAL_COMMUNITY): Payer: Self-pay | Admitting: General Practice

## 2014-10-12 DIAGNOSIS — F32A Depression, unspecified: Secondary | ICD-10-CM

## 2014-10-12 DIAGNOSIS — E43 Unspecified severe protein-calorie malnutrition: Secondary | ICD-10-CM

## 2014-10-12 DIAGNOSIS — K625 Hemorrhage of anus and rectum: Secondary | ICD-10-CM

## 2014-10-12 DIAGNOSIS — F329 Major depressive disorder, single episode, unspecified: Secondary | ICD-10-CM

## 2014-10-12 DIAGNOSIS — D62 Acute posthemorrhagic anemia: Secondary | ICD-10-CM

## 2014-10-12 HISTORY — DX: Depression, unspecified: F32.A

## 2014-10-12 LAB — CBC
HEMATOCRIT: 25.2 % — AB (ref 36.0–46.0)
HEMOGLOBIN: 7.9 g/dL — AB (ref 12.0–15.0)
MCH: 29.3 pg (ref 26.0–34.0)
MCHC: 31.3 g/dL (ref 30.0–36.0)
MCV: 93.3 fL (ref 78.0–100.0)
Platelets: 197 10*3/uL (ref 150–400)
RBC: 2.7 MIL/uL — AB (ref 3.87–5.11)
RDW: 16.5 % — ABNORMAL HIGH (ref 11.5–15.5)
WBC: 7.5 10*3/uL (ref 4.0–10.5)

## 2014-10-12 LAB — MAGNESIUM: Magnesium: 1.2 mg/dL — ABNORMAL LOW (ref 1.7–2.4)

## 2014-10-12 LAB — BASIC METABOLIC PANEL
Anion gap: 6 (ref 5–15)
CHLORIDE: 103 mmol/L (ref 101–111)
CO2: 26 mmol/L (ref 22–32)
Calcium: 8 mg/dL — ABNORMAL LOW (ref 8.9–10.3)
Creatinine, Ser: 0.44 mg/dL (ref 0.44–1.00)
GFR calc Af Amer: >60 mL/min (ref >60)
GFR calc non Af Amer: >60 mL/min (ref >60)
GLUCOSE: 93 mg/dL (ref 65–99)
POTASSIUM: 3.5 mmol/L (ref 3.5–5.1)
Sodium: 135 mmol/L (ref 135–145)

## 2014-10-12 MED ORDER — GI COCKTAIL ~~LOC~~
30.0000 mL | Freq: Three times a day (TID) | ORAL | Status: DC | PRN
  Administered 2014-10-15: 30 mL via ORAL
  Filled 2014-10-12 (×2): qty 30

## 2014-10-12 MED ORDER — SODIUM CHLORIDE 0.9 % IV SOLN
5.0000 mg/kg | Freq: Once | INTRAVENOUS | Status: AC
  Administered 2014-10-13: 500 mg via INTRAVENOUS
  Filled 2014-10-12: qty 50

## 2014-10-12 MED ORDER — PANTOPRAZOLE SODIUM 40 MG PO TBEC
40.0000 mg | DELAYED_RELEASE_TABLET | Freq: Every day | ORAL | Status: DC
  Administered 2014-10-12 – 2014-10-17 (×6): 40 mg via ORAL
  Filled 2014-10-12 (×6): qty 1

## 2014-10-12 MED ORDER — MAGNESIUM SULFATE 4 GM/100ML IV SOLN
4.0000 g | Freq: Once | INTRAVENOUS | Status: AC
  Administered 2014-10-12: 4 g via INTRAVENOUS
  Filled 2014-10-12: qty 100

## 2014-10-12 MED ORDER — POTASSIUM CHLORIDE CRYS ER 20 MEQ PO TBCR
40.0000 meq | EXTENDED_RELEASE_TABLET | Freq: Once | ORAL | Status: AC
  Administered 2014-10-12: 40 meq via ORAL
  Filled 2014-10-12: qty 2

## 2014-10-12 MED ORDER — ALPRAZOLAM 0.5 MG PO TABS
0.5000 mg | ORAL_TABLET | Freq: Every day | ORAL | Status: DC
  Administered 2014-10-12: 0.5 mg via ORAL
  Filled 2014-10-12: qty 1

## 2014-10-12 NOTE — Progress Notes (Signed)
    Progress Note   Subjective  crying, can't understand why she can't get better.    Objective   Vital signs in last 24 hours: Temp:  [98.5 F (36.9 C)] 98.5 F (36.9 C) (08/25 0614) Pulse Rate:  [80-101] 80 (08/25 0614) Resp:  [9-18] 18 (08/25 0614) BP: (107-147)/(52-84) 107/52 mmHg (08/25 0614) SpO2:  [93 %-100 %] 93 % (08/25 0614) Last BM Date: 10/11/14 General:    Pleasant black female in NAD Abdomen:  Soft,nondistended, diffuse lower tenderness. Brown stool in ostomy. Normal bowel sounds. Extremities:  Without edema. Neurologic:  Alert and oriented,  grossly normal neurologically. Psych:  Cooperative. Normal mood and affect.  Lab Results:  Recent Labs  10/10/14 0615 10/11/14 0349 10/12/14 0407  WBC 11.5* 7.5 7.5  HGB 9.5* 8.2* 7.9*  HCT 29.7* 25.6* 25.2*  PLT 218 247 197   BMET  Recent Labs  10/10/14 0615 10/11/14 0349 10/12/14 0407  NA 133* 132* 135  K 3.5 3.5 3.5  CL 101 99* 103  CO2 24 25 26   GLUCOSE 105* 102* 93  BUN <5* <5* <5*  CREATININE 0.49 0.52 0.44  CALCIUM 8.4* 7.8* 8.0*      Assessment / Plan:     74 year old female with complicated GI history. She has a history of UC but recent events have raised suspicion of Crohn's instead. Patient with recent development of perirectal abscess / rectal fistula  / perforation in setting of [employer]. She is s/p diverting ileostomy late July. Sigmoidoscopy a couple of weeks ago revealed significant improvement in colitis. Patient ultimately discharged to SNF but sent back for rectal bleeding. Surgery following, no need for intervention. Sigmoidoscopy yesterday revealed mild to moderate active colitis. Path pending but if no unexpected findings such as CMV then will give next Remicade dose this admission.    Patient despondent, crying. Spoke at length about sequence of events leading to her current medical condition. Encouraged her to be positive.     LOS: 2 days   Tye Savoy  10/12/2014, 10:19  AM   GI ATTENDING  Interval history data reviewed. Patient seen and examined. Agree with interval progress note. I have reviewed the biopsies with the pathologist. Findings consistent with inflammatory bowel disease. No evidence for other issues such as CMV. Will proceed with Remicade infusion at this time. Discussed with Dr. Deatra Ina as well, who agrees.  Docia Chuck. Geri Seminole., M.D. [facility] Division of Gastroenterology.

## 2014-10-12 NOTE — Progress Notes (Signed)
[facility] PROGRESS NOTE  [name]:[id number] DOB: 04/18/1940 DOA: 10/10/2014 PCP: Kandice Hams, MD  Assessment/Plan: #1 ulcerative colitis flare Per CT scan and per flexible sigmoidoscopy done 10/11/2014. Biopsy consistent with active colitis and negative for dysplasia or malignancy. Patient to be started on IV Remicade per GI. Per GI.  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 Patient has been seen in consultation by general surgery who recommended continuing twice a day dressing changes to the buttocks wound and continue routine ostomy care. Patient needs to follow-up with general surgery as outpatient.  #3 questionable UTI Urine cultures pending, however cannot final order oral pending lab. Patient on IV Rocephin. Will probably DC IV Rocephin tomorrow.  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 Patient is status post IVC filter placement.  #5 severe protein calorie malnutrition Continue nutritional supplements.  #6 depression Patient tearful. Patient depressed about medical condition. Continue Zoloft. Consult psychiatry for further evaluation and management.  #7 hyponatremia/hypomagnesemia Likely secondary to volume depletion. Improved with hydration. Replete magnesium. Continue IV fluids. Follow.  #8 hypertension Stable.   #9 severe deconditioning PT. Patient was in nursing facility prior to admission.  Code Status: Full Family Communication: Updated patient and daughter at bedside. Disposition Plan: Once clinical improvement will likely need to go back to SNF. Per gastroenterology.   Consultants:  Gastroenterology: Dr. Henrene Pastor 10/10/2014  Gen. surgery: Dr. Marlou Starks 10/10/2014  Procedures:  CT abdomen and pelvis 10/10/2014  Flexible sigmoidoscopy Dr. Henrene Pastor 10/11/2014  Antibiotics:  IV Rocephin  10/10/2014  HPI/Subjective: Patient denies any nausea or emesis. Patient tearful. Patient depressed. Patient states some improvement with abdominal pain.  Objective: Filed Vitals:   10/12/14 1359  BP: 114/58  Pulse: 82  Temp: 98.7 F (37.1 C)  Resp: 18    Intake/Output Summary (Last 24 hours) at 10/12/14 1504 Last data filed at 10/12/14 1248  Gross per 24 hour  Intake   2831 ml  Output    600 ml  Net   2231 ml   There were no vitals filed for this visit.  Exam:   General:  NAD  Cardiovascular: RRR  Respiratory: CTAB  Abdomen: Soft, diffuse tenderness to palpation, positive bowel sounds, no rebound, no guarding  Musculoskeletal: No clubbing cyanosis or edema.  Data Reviewed: Basic Metabolic Panel:  Recent Labs Lab 10/10/14 0615 10/11/14 0349 10/12/14 0407  NA 133* 132* 135  K 3.5 3.5 3.5  CL 101 99* 103  CO2 24 25 26   GLUCOSE 105* 102* 93  BUN <5* <5* <5*  CREATININE 0.49 0.52 0.44  CALCIUM 8.4* 7.8* 8.0*  MG  --   --  1.2*   Liver Function Tests:  Recent Labs Lab 10/10/14 0615  AST 37  ALT 31  ALKPHOS 86  BILITOT 0.7  PROT 5.5*  ALBUMIN 2.2*    Recent Labs Lab 10/10/14 0615  LIPASE 27   No results for input(s): AMMONIA in the last 168 hours. CBC:  Recent Labs Lab 10/06/14 1222 10/07/14 0444 10/10/14 0615 10/11/14 0349 10/12/14 0407  WBC 10.3 8.8 11.5* 7.5 7.5  NEUTROABS  --   --  8.9*  --   --   HGB 9.1* 8.4* 9.5* 8.2* 7.9*  HCT 28.7* 26.4* 29.7* 25.6* 25.2*  MCV 92.6 92.3 92.0 92.8 93.3  PLT 200 180 218 247 197   Cardiac Enzymes: No results for input(s): CKTOTAL,  CKMB, CKMBINDEX, TROPONINI in the last 168 hours. BNP (last 3 results) No results for input(s): BNP in the last 8760 hours.  ProBNP (last 3 results) No results for input(s): PROBNP in the last 8760 hours.  CBG:  Recent Labs Lab 10/06/14 1617 10/07/14 0753 10/07/14 1121 10/11/14 1157 10/11/14 1632  GLUCAP 109* 89 114* 96 128*    Recent Results (from  the past 240 hour(s))  Surgical pcr screen     Status: None   Collection Time: 10/10/14  3:23 PM  Result Value Ref Range Status   MRSA, PCR NEGATIVE NEGATIVE Final   Staphylococcus aureus NEGATIVE NEGATIVE Final    Comment:        The Xpert SA Assay (FDA approved for NASAL specimens in patients over 45 years of age), is one component of a comprehensive surveillance program.  Test performance has been validated by [facility] for patients greater than or equal to 85 year old. It is not intended to diagnose infection nor to guide or monitor treatment.   C difficile quick scan w PCR reflex     Status: None   Collection Time: 10/11/14  4:17 AM  Result Value Ref Range Status   C Diff antigen NEGATIVE NEGATIVE Final   C Diff toxin NEGATIVE NEGATIVE Final   C Diff interpretation Negative for toxigenic C. difficile  Final     Studies: No results found.  Scheduled Meds: . cefTRIAXone (ROCEPHIN)  IV  1 g Intravenous Q24H  . feeding supplement  1 Container Oral TID BM  . pantoprazole  40 mg Oral Q0600  . rosuvastatin  40 mg Oral QHS  . saccharomyces boulardii  250 mg Oral BID  . sertraline  50 mg Oral Daily   Continuous Infusions: . sodium chloride 75 mL/hr at 10/12/14 0604    Principal Problem:   Ulcerative colitis Active Problems:   Essential hypertension   Hyponatremia   Abscess, perirectal s/p I&D 08/23/2014   UTI (lower urinary tract infection)   Pancolitis   Protein-calorie malnutrition, severe    Time spent: 14 minutes    Shakiyla Kook M.D. [facility] Pager [phone]. If 7PM-7AM, please contact night-coverage at [email or web address], password [facility] 10/12/2014, 3:04 PM  LOS: 2 days

## 2014-10-12 NOTE — Progress Notes (Signed)
   Hospitalist asked me to talk with daughter who was in room with patient. I came by but daughter had left. Patient visiting with Doristine Bosworth. She is crying. Can't sleep at night. Depressed. I spoke with Hospitalist who has already consulted Psych. I will call daugher Ramona per patient's request. Patient to get Remicade this evening.  Tye Savoy, NP   GI ATTENDING  As above. IV team for Remicade not available until tomorrow afternoon  Docia Chuck. Geri Seminole., M.D. [facility] Division of Gastroenterology

## 2014-10-12 NOTE — Progress Notes (Signed)
   10/11/14 2025  PT Visit Information  Last PT Received On 10/12/14  Assistance Needed +2  History of Present Illness Patient is a 74 yo female who presents with persistent colitis. recently admitted on 09/29/14 from [facility] due to rectal bleeding.  B Pt also has diverting ileostomy.  PMH inculdes ulcerative colitis, CVA, (B) DVTs, perirectal abscess.   PT Time Calculation  PT Start Time (ACUTE ONLY) 1015  PT Stop Time (ACUTE ONLY) 1027  PT Time Calculation (min) (ACUTE ONLY) 12 min  Subjective Data  Subjective to get back to bed  Precautions  Precautions Fall  Precaution Comments ostomy, sacral wound  Restrictions  Weight Bearing Restrictions No  Cognition  Arousal/Alertness Awake/alert  Behavior During Therapy WFL for tasks assessed/performed  Overall Cognitive Status History of cognitive impairments - at baseline  Bed Mobility  Overal bed mobility Needs Assistance  Bed Mobility Rolling;Sit to Supine  Rolling Mod assist  Sit to supine Min assist  Transfers  Overall transfer level Needs assistance  Equipment used 2 person hand held assist  Transfers Anterior-Posterior Transfer  Anterior-Posterior transfers Total assist;+2 physical assistance  General transfer comment attempted use of stedy to return to bed, patient unable to perform secondary to fatigue, 2 person transfer performed via draw sheet with posterior to anterior assist,  PT - End of Session  Activity Tolerance Patient limited by fatigue  Patient left in bed;with call bell/phone within reach;with bed alarm set;with family/visitor present  Nurse Communication Mobility status;Need for lift equipment  PT - Assessment/Plan  PT Plan Current plan remains appropriate  PT Frequency (ACUTE ONLY) Min 2X/week  Follow Up Recommendations SNF;Supervision/Assistance - 24 hour  PT equipment None recommended by PT  PT General Charges  $$ ACUTE PT VISIT 1 Procedure  PT Treatments  $Therapeutic Activity 8-22 mins     Patient seen for return to bed as requested by nsg as they could not safely return patient to bed secondary to patient fatigue. Patient remained OOB >1.5 hours. Attempted use of assistive equipment (stedy) patient unable to use secondary to fatigue. Total transfer performed.  Will continue to see and progress as tolerated. Continue to recommend return to SNF.  Alben Deeds, [place] DPT  [phone]

## 2014-10-12 NOTE — Progress Notes (Signed)
   10/12/14 1200  Clinical Encounter Type  Visited With Patient;Health care provider  Visit Type Initial;Spiritual support;Social support;Post-op  Spiritual Encounters  Spiritual Needs Emotional  Stress Factors  Patient Stress Factors Family relationships;Major life changes   Chaplain was referred to patient via spiritual care consult. Chaplain was able to visit with patient for roughly 30 minutes today. Patient really wanted to talk to some one and was having a hard day. Patient explained that she has had surgery twice in the past few weeks and has been hospitalized for around 6 months. Despite all of this, the patient does not feel she is getting better. Patient's husband also had a fall about a week ago and had to go to a nursing home facility. Patient has not been able to see her husband in a long time because of this situation and her hospitalization. Patient is experiencing a lot of loneliness and feels her loneliness is compounded by the pain she has been experiencing. Patient does have grown children but she feels they have not been supporting her enough. Patient simply does not like being alone while she is in pain. Chaplain discussed loneliness with the patient and helped the patient navigate her family's dynamics. Chaplain will continue to provide emotional and spiritual support for patient and patient's family as needed.  Gar Ponto, Chaplain  12:20 PM

## 2014-10-12 NOTE — Progress Notes (Signed)
Received consult for Remicade.  Unable to administered med this evening as IV team RN is not available until 10/13/14 from 1500-2300.  Dr. Henrene Pastor aware and will reschedule med to be given tomorrow afternoon.   Meighan Treto D. Mina Marble, PharmD, BCPS Pager:  [phone] 10/12/2014, 6:12 PM

## 2014-10-13 ENCOUNTER — Other Ambulatory Visit (HOSPITAL_COMMUNITY): Payer: Self-pay | Admitting: Gastroenterology

## 2014-10-13 ENCOUNTER — Other Ambulatory Visit: Payer: Self-pay | Admitting: *Deleted

## 2014-10-13 ENCOUNTER — Encounter: Payer: Self-pay | Admitting: *Deleted

## 2014-10-13 DIAGNOSIS — K611 Rectal abscess: Secondary | ICD-10-CM

## 2014-10-13 LAB — BASIC METABOLIC PANEL
Anion gap: 6 (ref 5–15)
CALCIUM: 7.6 mg/dL — AB (ref 8.9–10.3)
CHLORIDE: 100 mmol/L — AB (ref 101–111)
CO2: 25 mmol/L (ref 22–32)
CREATININE: 0.44 mg/dL (ref 0.44–1.00)
GFR calc non Af Amer: >60 mL/min (ref >60)
GLUCOSE: 84 mg/dL (ref 65–99)
Potassium: 3.6 mmol/L (ref 3.5–5.1)
Sodium: 131 mmol/L — ABNORMAL LOW (ref 135–145)

## 2014-10-13 LAB — CBC
HEMATOCRIT: 26.6 % — AB (ref 36.0–46.0)
HEMOGLOBIN: 8.4 g/dL — AB (ref 12.0–15.0)
MCH: 29.2 pg (ref 26.0–34.0)
MCHC: 31.6 g/dL (ref 30.0–36.0)
MCV: 92.4 fL (ref 78.0–100.0)
Platelets: 215 10*3/uL (ref 150–400)
RBC: 2.88 MIL/uL — ABNORMAL LOW (ref 3.87–5.11)
RDW: 16.4 % — AB (ref 11.5–15.5)
WBC: 7.9 10*3/uL (ref 4.0–10.5)

## 2014-10-13 LAB — MAGNESIUM: MAGNESIUM: 1.7 mg/dL (ref 1.7–2.4)

## 2014-10-13 MED ORDER — MIRTAZAPINE 7.5 MG PO TABS
7.5000 mg | ORAL_TABLET | Freq: Every day | ORAL | Status: DC
  Administered 2014-10-13 – 2014-10-14 (×2): 7.5 mg via ORAL
  Filled 2014-10-13 (×3): qty 1

## 2014-10-13 MED ORDER — ZOLPIDEM TARTRATE 5 MG PO TABS
5.0000 mg | ORAL_TABLET | Freq: Every day | ORAL | Status: DC
  Administered 2014-10-13: 5 mg via ORAL
  Filled 2014-10-13: qty 1

## 2014-10-13 MED ORDER — SERTRALINE HCL 100 MG PO TABS
100.0000 mg | ORAL_TABLET | Freq: Every day | ORAL | Status: DC
  Administered 2014-10-14 – 2014-10-17 (×4): 100 mg via ORAL
  Filled 2014-10-13 (×4): qty 1

## 2014-10-13 MED ORDER — ENSURE ENLIVE PO LIQD
237.0000 mL | Freq: Two times a day (BID) | ORAL | Status: DC
  Administered 2014-10-14 – 2014-10-16 (×4): 237 mL via ORAL

## 2014-10-13 MED ORDER — MAGNESIUM SULFATE 2 GM/50ML IV SOLN
2.0000 g | Freq: Once | INTRAVENOUS | Status: AC
  Administered 2014-10-13: 2 g via INTRAVENOUS
  Filled 2014-10-13: qty 50

## 2014-10-13 MED ORDER — ALPRAZOLAM 0.5 MG PO TABS
0.5000 mg | ORAL_TABLET | Freq: Every evening | ORAL | Status: DC | PRN
  Administered 2014-10-16: 0.5 mg via ORAL
  Filled 2014-10-13: qty 1

## 2014-10-13 NOTE — Consult Note (Signed)
[facility] Consult   Reason for Consult:  Depression and medication management Referring Physician:  Dr. Grandville Silos Patient Identification: [name] MRN:  [id number] Principal Diagnosis: Depression Diagnosis:   Patient Active Problem List   Diagnosis Date Noted  . Protein-calorie malnutrition, severe [E43] 10/12/2014  . Depression [F32.9] 10/12/2014  . Pancolitis [K51.90]   . UTI (lower urinary tract infection) [N39.0] 10/10/2014  . Pressure ulcer [L89.90] 10/05/2014  . Inflammatory bowel disease [K63.89]   . Malnutrition of moderate degree [E44.0] 09/30/2014  . Rectal bleeding [K62.5] 09/29/2014  . GI bleed [K92.2] 09/29/2014  . Elevated lactic acid level [E87.2]   . 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 Fasciitis [M72.9]   . Cold [J00]   . DVT (deep venous thrombosis) [I82.409]   . Ulcerative colitis [K51.90]   . Abscess, perirectal s/p I&D 08/23/2014 [K61.1] 08/27/2014  . Sepsis [A41.9] 08/26/2014  . Infection due to yeast [B37.9] 08/26/2014  . Acute blood loss anemia [D62] 08/26/2014  . Leukocytosis [D72.829] 08/26/2014  . Hypokalemia [E87.6] 08/26/2014  . Hyponatremia [E87.1] 08/26/2014  . General weakness [R53.1] 08/26/2014  . Thrombocytopenia [D69.6] 08/26/2014  . Essential hypertension [I10] 07/11/2008  . Inflammatory bowel disease (IBD) with colitis [K63.89] 07/11/2008    Total Time spent with patient: 1 hour  Subjective:   [name] is a 74 y.o. female patient admitted with lower abdominal pain with bloody stools.  HPI:  [name] is a 75 y.o. female seen face-to-face evaluation for psychiatric consultation and evaluation of increased symptoms of depression and medication management. Patient endorses symptoms of depression, sadness, tearful, psychomotor retardation, low energy, disturbed sleep and appetite, unable to care for herself, feeling alone and isolated. Patient reported she is a resident of skilled nursing facility and her husband also lives  there. Patient is worried about her husband health more than herself. Patient denies current suicidal/homicidal ideation, intention or plans. Patient has no evidence of psychosis. Patient has no previous history of acute psychiatric hospitalizations and she has no outpatient psychiatric services.   HPI Elements:   Location:  Depression. Quality:  Poor. Severity:  Unable to care for herself. Timing:  Deteriorated  health for herself and her husband . Duration:  Few weeks to months. Context:  Psychosocial stresses.  Past Medical History:  Past Medical History  Diagnosis Date  . CAD (coronary artery disease)     unspecified  . Cerebrovascular disease 2010    bil carotid artery disease. left CEA 02/2004.   Marland Kitchen Hyperlipidemia, mixed   . HTN (hypertension)   . Ulcerative colitis 2002  . Colon polyps 2006  . Thyroid disease   . Myocardial infarct 1998  . Carotid artery occlusion 2010  . Diabetes mellitus without complication     Borderline  . GERD (gastroesophageal reflux disease)   . Arthritis   . 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   . Depression 10/12/2014    Past Surgical History  Procedure Laterality Date  . Cholecystectomy  7.06.2008    with cholangiogram  . 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 Abdominal hysterectomy    . Colonoscopy  multiple  . Sigmoidoscopy  multiple  . Flexible sigmoidoscopy N/A 08/08/2014    Procedure: FLEXIBLE SIGMOIDOSCOPY;  Surgeon: Gatha Mayer, MD;  Location: [place];  Service: Endoscopy;  Laterality: N/A;  . Incision and drainage perirectal abscess N/A 08/22/2014    Procedure: IRRIGATION AND DEBRIDEMENT PERIRECTAL ABSCESS;  Surgeon: Jackolyn Confer, MD;  Location: [facility];  Service: General;  Laterality: N/A;  . Joint replacement  left knee  . Flexible sigmoidoscopy N/A 09/06/2014    Procedure: FLEXIBLE SIGMOIDOSCOPY;  Surgeon: Lafayette Dragon, MD;  Location: [facility];  Service: Endoscopy;  Laterality: N/A;  . Laparoscopic  diverted colostomy N/A 09/13/2014    Procedure: LAPAROSCOPIC DIVERTED ILEOSTOMY;  Surgeon: Stark Klein, MD;  Location: [facility];  Service: General;  Laterality: N/A;  . Rectal exam under anesthesia N/A 09/13/2014    Procedure: RECTAL EXAM UNDER ANESTHESIA  AND DEBRIDEMENT OF PERIANAL WOUND;  Surgeon: Stark Klein, MD;  Location: [facility];  Service: General;  Laterality: N/A;  . Flexible sigmoidoscopy N/A 10/04/2014    Procedure: Otho Darner SIGMOIDOSCOPY;  Surgeon: Inda Castle, MD;  Location: [place];  Service: Endoscopy;  Laterality: N/A;  . Flexible sigmoidoscopy N/A 10/11/2014    Procedure: FLEXIBLE SIGMOIDOSCOPY;  Surgeon: Irene Shipper, MD;  Location: [place];  Service: Endoscopy;  Laterality: N/A;   Family History:  Family History  Problem Relation Age of Onset  . Colitis Brother     1 bro with Crohn's, another bro with unspecified colitis.   . Stomach cancer Mother   . Stroke Father     died  . Colon cancer Neg Hx    Social History:  History  Alcohol Use No     History  Drug Use No    Social History   Social History  . Marital Status: Married    Spouse Name: N/A  . Number of Children: N/A  . Years of Education: N/A   Occupational History  . retired    Social History Main Topics  . Smoking status: Former Smoker -- 0.30 packs/day for 40 years    Types: Cigarettes    Quit date: 02/17/2013  . Smokeless tobacco: Never Used  . Alcohol Use: No  . Drug Use: No  . Sexual Activity: Not Asked   Other Topics Concern  . None   Social History Narrative   Additional Social History:                          Allergies:   Allergies  Allergen Reactions  . Clindamycin/Lincomycin Diarrhea    Leads to colitis flare  . Ivp Dye [Iodinated Diagnostic Agents] Other (See Comments)    "almost passed out"    Labs:  Results for orders placed or performed during the hospital encounter of 10/10/14 (from the past 48 hour(s))  Glucose, capillary     Status: None    Collection Time: 10/11/14 11:57 AM  Result Value Ref Range   Glucose-Capillary 96 65 - 99 mg/dL  Glucose, capillary     Status: Abnormal   Collection Time: 10/11/14  4:32 PM  Result Value Ref Range   Glucose-Capillary 128 (H) 65 - 99 mg/dL  CBC     Status: Abnormal   Collection Time: 10/12/14  4:07 AM  Result Value Ref Range   WBC 7.5 4.0 - 10.5 K/uL   RBC 2.70 (L) 3.87 - 5.11 MIL/uL   Hemoglobin 7.9 (L) 12.0 - 15.0 g/dL   HCT 25.2 (L) 36.0 - 46.0 %   MCV 93.3 78.0 - 100.0 fL   MCH 29.3 26.0 - 34.0 pg   MCHC 31.3 30.0 - 36.0 g/dL   RDW 16.5 (H) 11.5 - 15.5 %   Platelets 197 150 - 400 K/uL  Basic metabolic panel     Status: Abnormal   Collection Time: 10/12/14  4:07  AM  Result Value Ref Range   Sodium 135 135 - 145 mmol/L   Potassium 3.5 3.5 - 5.1 mmol/L   Chloride 103 101 - 111 mmol/L   CO2 26 22 - 32 mmol/L   Glucose, Bld 93 65 - 99 mg/dL   BUN <5 (L) 6 - 20 mg/dL   Creatinine, Ser 0.44 0.44 - 1.00 mg/dL   Calcium 8.0 (L) 8.9 - 10.3 mg/dL   GFR calc non Af Amer >60 >60 mL/min   GFR calc Af Amer >60 >60 mL/min    Comment: (NOTE) The eGFR has been calculated using the CKD EPI equation. This calculation has not been validated in all clinical situations. eGFR's persistently <60 mL/min signify possible Chronic Kidney Disease.    Anion gap 6 5 - 15  Magnesium     Status: Abnormal   Collection Time: 10/12/14  4:07 AM  Result Value Ref Range   Magnesium 1.2 (L) 1.7 - 2.4 mg/dL  Magnesium     Status: None   Collection Time: 10/13/14  3:37 AM  Result Value Ref Range   Magnesium 1.7 1.7 - 2.4 mg/dL  CBC     Status: Abnormal   Collection Time: 10/13/14  3:37 AM  Result Value Ref Range   WBC 7.9 4.0 - 10.5 K/uL   RBC 2.88 (L) 3.87 - 5.11 MIL/uL   Hemoglobin 8.4 (L) 12.0 - 15.0 g/dL   HCT 26.6 (L) 36.0 - 46.0 %   MCV 92.4 78.0 - 100.0 fL   MCH 29.2 26.0 - 34.0 pg   MCHC 31.6 30.0 - 36.0 g/dL   RDW 16.4 (H) 11.5 - 15.5 %   Platelets 215 150 - 400 K/uL  Basic metabolic  panel     Status: Abnormal   Collection Time: 10/13/14  3:37 AM  Result Value Ref Range   Sodium 131 (L) 135 - 145 mmol/L   Potassium 3.6 3.5 - 5.1 mmol/L   Chloride 100 (L) 101 - 111 mmol/L   CO2 25 22 - 32 mmol/L   Glucose, Bld 84 65 - 99 mg/dL   BUN <5 (L) 6 - 20 mg/dL   Creatinine, Ser 0.44 0.44 - 1.00 mg/dL   Calcium 7.6 (L) 8.9 - 10.3 mg/dL   GFR calc non Af Amer >60 >60 mL/min   GFR calc Af Amer >60 >60 mL/min    Comment: (NOTE) The eGFR has been calculated using the CKD EPI equation. This calculation has not been validated in all clinical situations. eGFR's persistently <60 mL/min signify possible Chronic Kidney Disease.    Anion gap 6 5 - 15    Vitals: Blood pressure 109/67, pulse 84, temperature 97.2 F (36.2 C), temperature source Oral, resp. rate 16, height 5' 9"  (1.753 m), SpO2 96 %.  Risk to Self: Is patient at risk for suicide?: No Risk to Others:   Prior Inpatient Therapy:   Prior Outpatient Therapy:    Current Facility-Administered Medications  Medication Dose Route Frequency Provider Last Rate Last Dose  . acetaminophen (TYLENOL) tablet 650 mg  650 mg Oral Q6H PRN Kelvin Cellar, MD   650 mg at 10/13/14 0804   Or  . acetaminophen (TYLENOL) suppository 650 mg  650 mg Rectal Q6H PRN Kelvin Cellar, MD      . ALPRAZolam Duanne Moron) tablet 0.5 mg  0.5 mg Oral QHS Willia Craze, NP   0.5 mg at 10/12/14 2122  . cefTRIAXone (ROCEPHIN) 1 g in dextrose 5 % 50 mL IVPB  1  g Intravenous Q24H Kelvin Cellar, MD   1 g at 10/12/14 1333  . feeding supplement (BOOST / RESOURCE BREEZE) liquid 1 Container  1 Container Oral TID BM Kelvin Cellar, MD   1 Container at 10/12/14 1033  . gi cocktail (Maalox,Lidocaine,Donnatal)  30 mL Oral TID PRN Eugenie Filler, MD      . inFLIXimab (REMICADE) 5 mg/kg = 500 mg in sodium chloride 0.9 % 250 mL infusion  5 mg/kg Intravenous Once Willia Craze, NP      . magnesium sulfate IVPB 2 g 50 mL  2 g Intravenous Once Eugenie Filler,  MD   2 g at 10/13/14 1039  . morphine 2 MG/ML injection 2 mg  2 mg Intravenous Q4H PRN Kelvin Cellar, MD   2 mg at 10/13/14 0458  . ondansetron (ZOFRAN) tablet 4 mg  4 mg Oral Q6H PRN Kelvin Cellar, MD       Or  . ondansetron (ZOFRAN) injection 4 mg  4 mg Intravenous Q6H PRN Kelvin Cellar, MD      . oxyCODONE (Oxy IR/ROXICODONE) immediate release tablet 5 mg  5 mg Oral Q4H PRN Kelvin Cellar, MD   5 mg at 10/13/14 1049  . pantoprazole (PROTONIX) EC tablet 40 mg  40 mg Oral Q0600 Eugenie Filler, MD   40 mg at 10/13/14 0501  . rosuvastatin (CRESTOR) tablet 40 mg  40 mg Oral QHS Kelvin Cellar, MD   40 mg at 10/12/14 2122  . saccharomyces boulardii (FLORASTOR) capsule 250 mg  250 mg Oral BID Kelvin Cellar, MD   250 mg at 10/13/14 1019  . sertraline (ZOLOFT) tablet 50 mg  50 mg Oral Daily Kelvin Cellar, MD   50 mg at 10/13/14 1020    Musculoskeletal: Strength & Muscle Tone: decreased Gait & Station: unable to stand Patient leans: N/A  Psychiatric Specialty Exam: Physical Exam as per history and physical   ROS complaining of lower abdominal pain and unable to walk but denied nausea and vomiting, shortness of breath and chest pain No Fever-chills, No Headache, No changes with Vision or hearing, reports vertigo No problems swallowing food or Liquids, No Chest pain, Cough or Shortness of Breath, No Nausea or Vommitting, Bowel movements are regular, No Blood in stool or Urine, No dysuria, No new skin rashes or bruises, No new joints pains-aches,  No new weakness, tingling, numbness in any extremity, No recent weight gain or loss, No polyuria, polydypsia or polyphagia,   A full 10 point Review of Systems was done, except as stated above, all other Review of Systems were negative.  Blood pressure 109/67, pulse 84, temperature 97.2 F (36.2 C), temperature source Oral, resp. rate 16, height 5' 9"  (1.753 m), SpO2 96 %.There is no weight on file to calculate BMI.  General  Appearance: Guarded  Eye Contact::  Good  Speech:  Clear and Coherent and Slow  Volume:  Decreased  Mood:  Dysphoric  Affect:  Depressed and Flat  Thought Process:  Coherent and Goal Directed  Orientation:  Full (Time, Place, and Person)  Thought Content:  WDL  Suicidal Thoughts:  No  Homicidal Thoughts:  No  Memory:  Immediate;   [name] Recent;   [name]  Judgement:  Intact  Insight:  [name]  Psychomotor Activity:  Psychomotor Retardation  Concentration:  [name]  Recall:  Smiley Houseman of Knowledge:[name]  Language: Good  Akathisia:  Negative  Handed:  Right  AIMS (if indicated):     Assets:  Communication  Skills Desire for Improvement Financial Resources/Insurance Leisure Time Resilience Social Support Talents/Skills Transportation  ADL's:  Impaired  Cognition: WNL  Sleep:      Medical Decision Making: New problem, with additional work up planned, Review of Psycho-Social Stressors (1), Review or order clinical lab tests (1), Established Problem, Worsening (2), Review of Last Therapy Session (1), Review or order medicine tests (1), Review of Medication Regimen & Side Effects (2) and Review of New Medication or Change in Dosage (2)  Treatment Plan Summary: Patient presented with increased symptoms of her depression, anxiety, disturbed sleep and appetite but contract for safety without suicidal or homicidal ideation and psychosis. Daily contact with patient to assess and evaluate symptoms and progress in treatment and Medication management  Plan:  We will increase her medication Zoloft 100 mg daily for better control of symptoms of depression and introduce Remeron 7.5 mg at bedtime for better sleep and appetite Patient does not meet criteria for psychiatric inpatient admission. Supportive therapy provided about ongoing stressors. Appreciate psychiatric consultation Please contact [phone] or [phone] if needs further assistance   Disposition: Patient will be referred to skilled  nursing facility when medically stable and outpatient medication management.   Margo Lama,JANARDHAHA R. 10/13/2014 10:51 AM

## 2014-10-13 NOTE — Patient Outreach (Signed)
[facility]) Care Management  10/13/2014  RHIAN ASEBEDO Apr 24, 1940 [id number]  CSW made contact with patient's husband, Jaydynn Wolford today to confirm patient's plans to return to [facility] [place], for long-term care placement. CSW introduced self, explained role and types of services provided through [employer].  CSW further reported the reason for the call, indicating that CSW was calling to assist with patient's discharge planning needs and services.  CSW obtained two HIPAA compliant identifiers from Mr. Mccarrell, which included patient's name and date of birth.  CSW is aware that patient is still hospitalized, receiving a psychiatric consult from Dr. Ambrose Finland for symptoms of Depression.  Mr. Tanabe reports that patient will return to live at [facility] [place] for long-term care placement, upon discharge from [facility] inquired as to how CSW could be of assistance to Mr. Killam at this time.  Mr. Neubert denied being able to identify any social work specific needs at present.  CSW provide Mr. Ribble with CSW's contact information, encouraging Mr. Basque to contact CSW directly if social work needs arise in the future.  Mr. Schwarz voiced understanding and was agreeable to this plan.  CSW will proceed with case closure on patient. CSW will no longer make arrangements to contact patient by phone or in person, as all goals of treatment have been met from social work standpoint and no additional social work needs have been identified at this time. CSW will notify patient's RNCM with [facility], Marthenia Rolling of CSW's plans to close patient's case. CSW will submit a case closure request to Lurline Del, Care Management Assistant with [facility], in the form of a message.   CSW will fax a correspondence letter to patient's Primary Care Physician, Dr. Seward Carol to report social work involvement with patient.  Nat Christen, BSW, MSW, [place] [facility] [place], [place], [place] [address] [email or web address] [phone]

## 2014-10-13 NOTE — Care Management Important Message (Signed)
Important Message  Patient Details  Name: [name] MRN: [id number] Date of Birth: 12-31-1940   Medicare Important Message Given:  Yes-second notification given    Ella Bodo, RN 10/13/2014, 5:23 PM

## 2014-10-13 NOTE — Evaluation (Signed)
Occupational Therapy Evaluation Patient Details Name: KIRAN LAPINE MRN: [id number] DOB: June 04, 1940 Today's Date: 10/13/2014    History of Present Illness Patient is a 74 yo female who presents with persistent colitis. recently admitted on 09/29/14 from [facility] due to rectal bleeding.  B Pt also has diverting ileostomy.  PMH inculdes ulcerative colitis, CVA, (B) DVTs, perirectal abscess.    Clinical Impression   Patient is s/p ileostomy surgery resulting in functional limitations due to the deficits listed below (see OT problem list). PTA from SNF and returning SNF Patient will benefit from skilled OT acutely to increase independence and safety with ADLS to allow discharge SNF.     Follow Up Recommendations  SNF    Equipment Recommendations  Other (comment) (defer)    Recommendations for Other Services       Precautions / Restrictions Precautions Precautions: Fall Precaution Comments: ostomy, sacral wound      Mobility Bed Mobility Overal bed mobility: Needs Assistance Bed Mobility: Supine to Sit;Sit to Supine Rolling: Mod assist   Supine to sit: Mod assist;HOB elevated Sit to supine: +2 for physical assistance;Mod assist;HOB elevated   General bed mobility comments: pt able to progress bil Le to eob with incr time. Pt required (A) to sequence task. pt needed two person (A) to sequence return to supine  Transfers Overall transfer level: Needs assistance Equipment used:  (stedy) Transfers: Sit to/from Stand Sit to Stand: +2 physical assistance;Max assist;From elevated surface (with pad)         General transfer comment: pt with pad used to attempt to extend hips and to elevate off surface    Balance Overall balance assessment: Needs assistance Sitting-balance support: Bilateral upper extremity supported;Feet supported Sitting balance-Leahy Scale: Poor                                      ADL Overall ADL's : Needs  assistance/impaired Eating/Feeding: Set up;Bed level   Grooming: Wash/dry face;Set up;Bed level   Upper Body Bathing: Maximal assistance;Bed level   Lower Body Bathing: Total assistance;Bed level                         General ADL Comments: Pt agreeable to eob sitting and very happy to sit up due to back rub possible at EOB. pt reluctant to complete sit,>Stand stating "i almost fell this morning" pt attempted sit<>Stand in stedy with buttock off bed ~5 inches and immediate return to sitting     Vision Vision Assessment?:  (~)   Perception     Praxis      Pertinent Vitals/Pain Pain Assessment: Faces Faces Pain Scale: Hurts little more Pain Intervention(s): Monitored during session;Repositioned     Hand Dominance Right   Extremity/Trunk Assessment Upper Extremity Assessment Upper Extremity Assessment: Generalized weakness   Lower Extremity Assessment Lower Extremity Assessment: Defer to PT evaluation   Cervical / Trunk Assessment Cervical / Trunk Assessment: Kyphotic   Communication Communication Communication: No difficulties   Cognition Arousal/Alertness: Awake/alert Behavior During Therapy: Anxious Overall Cognitive Status: History of cognitive impairments - at baseline                     General Comments       Exercises       Shoulder Instructions      Home Living Family/patient expects to be discharged to:: Skilled nursing facility  Prior Functioning/Environment Level of Independence: Needs assistance  Gait / Transfers Assistance Needed: Daughter states it has been a couple of months since patient able to walk.  Staff at [facility] reportedly have been using lift equipent to get pt. to a reclining type chair.  Daughte says pt. does not currently have a cushion when she sits up ADL's / Homemaking Assistance Needed: (A) at [facility]        OT Diagnosis: Generalized weakness    OT Problem List: Decreased strength;Decreased activity tolerance;Impaired balance (sitting and/or standing);Decreased safety awareness;Decreased knowledge of use of DME or AE;Decreased knowledge of precautions;Obesity;Pain   OT Treatment/Interventions: Self-care/ADL training;Therapeutic exercise;DME and/or AE instruction;Therapeutic activities;Patient/family education;Balance training    OT Goals(Current goals can be found in the care plan section) Acute Rehab OT Goals Patient Stated Goal: to believe in myself OT Goal Formulation: Patient unable to participate in goal setting Time For Goal Achievement: 10/27/14 Potential to Achieve Goals: Fair  OT Frequency:     Barriers to D/C:            Co-evaluation              End of Session Equipment Utilized During Treatment: Gait belt Nurse Communication: Mobility status;Precautions  Activity Tolerance: Patient tolerated treatment well Patient left: in bed;with call bell/phone within reach;with bed alarm set   Time: 1348-1411 OT Time Calculation (min): 23 min Charges:  OT General Charges $OT Visit: 1 Procedure OT Evaluation $Initial OT Evaluation Tier I: 1 Procedure G-Codes:    Peri Maris Oct 25, 2014, 2:59 PM  Jeri Modena   OTR/L Pager: [phone] Office: [phone] .

## 2014-10-13 NOTE — Progress Notes (Signed)
Physical Therapy Treatment Patient Details Name: [name] MRN: [id number] DOB: 1940/08/18 Today's Date: 10/13/2014    History of Present Illness Patient is a 74 yo female who presents with persistent colitis. recently admitted on 09/29/14 from [facility] due to rectal bleeding.  B Pt also has diverting ileostomy.  PMH inculdes ulcerative colitis, CVA, (B) DVTs, perirectal abscess.     PT Comments    Progressing with tolerance to activity this session as well as with mild improvements in standing.  Patient tearful as she doesn't feel she is progressing.  Encouragement provided and patient settled.  Still needs continued SNF level rehab at d/c.  Follow Up Recommendations  SNF;Supervision/Assistance - 24 hour     Equipment Recommendations  None recommended by PT    Recommendations for Other Services       Precautions / Restrictions Precautions Precautions: Fall Precaution Comments: ostomy, sacral wound Restrictions Weight Bearing Restrictions: No    Mobility  Bed Mobility Overal bed mobility: Needs Assistance   Rolling: Mod assist Sidelying to sit: Max assist       General bed mobility comments: cues for techniqute, assist to lift trunk and move legs  Transfers Overall transfer level: Needs assistance Equipment used: 2 person hand held assist   Sit to Stand: Max assist;+2 physical assistance Stand pivot transfers: Max assist;+2 physical assistance       General transfer comment: sit to stand x 2 from bed height assisting to lift with pad under hips and cues for pt to lift trunk and tuck hips to achieve erect standing with walker x approx 20 sec first trial then 30 seconds second trial; pivot to chair via +2 hand hold assist   Ambulation/Gait             General Gait Details: unable    Stairs            Wheelchair Mobility    Modified Rankin (Stroke Patients Only)       Balance Overall balance assessment: Needs assistance Sitting-balance  support: Bilateral upper extremity supported Sitting balance-Leahy Scale: Poor Sitting balance - Comments: close supervision and cues to maintain anterior weight shift with UE support for about 5 minutes at edge of bed Postural control: Posterior lean Standing balance support: Bilateral upper extremity supported Standing balance-Leahy Scale: Zero Standing balance comment: leaning posterior in standing and needs +2 assist with UE support on walker                     Cognition Arousal/Alertness: Awake/alert Behavior During Therapy: Anxious Overall Cognitive Status: History of cognitive impairments - at baseline                      Exercises General Exercises - Lower Extremity Ankle Circles/Pumps: AROM;Both;10 reps;Supine Heel Slides: AAROM;Both;10 reps;Supine Hip ABduction/ADduction: AAROM;Both;Supine;10 reps;AROM    General Comments General comments (skin integrity, edema, etc.): still with ankle edema      Pertinent Vitals/Pain Pain Score: 7  Pain Location: abdominal  Pain Descriptors / Indicators: Discomfort Pain Intervention(s): Monitored during session;Repositioned    Home Living                      Prior Function            PT Goals (current goals can now be found in the care plan section) Progress towards PT goals: Progressing toward goals    Frequency  Min 2X/week    PT Plan  Current plan remains appropriate    Co-evaluation             End of Session Equipment Utilized During Treatment: Gait belt Activity Tolerance: Patient limited by fatigue Patient left: in chair;with call bell/phone within reach;with chair alarm set     Time: [phone] PT Time Calculation (min) (ACUTE ONLY): 29 min  Charges:  $Therapeutic Exercise: 8-22 mins $Therapeutic Activity: 8-22 mins                    G Codes:      Koron Godeaux,CYNDI 28-Oct-2014, 10:09 AM  Magda Kiel, PT [phone] 10/28/14

## 2014-10-13 NOTE — Progress Notes (Signed)
[facility] PROGRESS NOTE  [name] CVE:[id number] DOB: 12/14/1940 DOA: 10/10/2014 PCP: Kandice Hams, MD  Assessment/Plan: #1 ulcerative colitis flare Per CT scan and per flexible sigmoidoscopy done 10/11/2014. Biopsy consistent with active colitis and negative for dysplasia or malignancy. Patient to be started on IV Remicade today per GI. Continue clears. Advance to full liquids. Per GI.  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 Patient has been seen in consultation by general surgery who recommended continuing twice a day dressing changes to the buttocks wound and continue routine ostomy care. Patient needs to follow-up with general surgery as outpatient.  #3 questionable UTI Urine cultures were not drawn on admission. Patient has been receiving IV Rocephin. Patient with no dysuria. Will discontinue IV Rocephin after today's dose.   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 Patient is status post IVC filter placement.  #5 severe protein calorie malnutrition Continue nutritional supplements.  #6 depression Patient depressed about medical condition. Continue Zoloft. Psych consultation pending.   #7 hyponatremia/hypomagnesemia Likely secondary to volume depletion. Improved with hydration. Replete magnesium. Continue IV fluids. Follow.  #8 hypertension Stable.   #9 severe deconditioning PT. Patient was in nursing facility prior to admission.  Code Status: Full Family Communication: Updated patient and daughter at bedside. Disposition Plan: Once clinical improvement will likely need to go back to SNF. Per gastroenterology.   Consultants:  Gastroenterology: Dr. Henrene Pastor 10/10/2014  Gen. surgery: Dr. Marlou Starks 10/10/2014  Psychiatry: Dr. Louretta Shorten 10/13/2014  Procedures:  CT abdomen and pelvis 10/10/2014  Flexible  sigmoidoscopy Dr. Henrene Pastor 10/11/2014  Antibiotics:  IV Rocephin 10/10/2014>>>>> 10/13/2014  HPI/Subjective: Patient denies any nausea or emesis. Patient tolerating clear liquids. Patient states improvement with abdominal pain. Patient with generalized weakness. Patient states she doesn't want to return back to the skilled nursing facility that she was at. Patient denies further bleeding.  Objective: Filed Vitals:   10/13/14 0800  BP: 109/67  Pulse: 84  Temp: 97.2 F (36.2 C)  Resp: 16    Intake/Output Summary (Last 24 hours) at 10/13/14 1455 Last data filed at 10/13/14 0820  Gross per 24 hour  Intake    280 ml  Output    400 ml  Net   -120 ml   There were no vitals filed for this visit.  Exam:   General:  NAD  Cardiovascular: RRR  Respiratory: CTAB  Abdomen: Soft, decreasing diffuse tenderness to palpation, positive bowel sounds, no rebound, no guarding  Musculoskeletal: No clubbing cyanosis or edema.  Data Reviewed: Basic Metabolic Panel:  Recent Labs Lab 10/10/14 0615 10/11/14 0349 10/12/14 0407 10/13/14 0337  NA 133* 132* 135 131*  K 3.5 3.5 3.5 3.6  CL 101 99* 103 100*  CO2 24 25 26 25   GLUCOSE 105* 102* 93 84  BUN <5* <5* <5* <5*  CREATININE 0.49 0.52 0.44 0.44  CALCIUM 8.4* 7.8* 8.0* 7.6*  MG  --   --  1.2* 1.7   Liver Function Tests:  Recent Labs Lab 10/10/14 0615  AST 37  ALT 31  ALKPHOS 86  BILITOT 0.7  PROT 5.5*  ALBUMIN 2.2*    Recent Labs Lab 10/10/14 0615  LIPASE 27   No results for input(s): AMMONIA in the last 168 hours. CBC:  Recent Labs Lab 10/07/14 0444 10/10/14 0615 10/11/14 0349 10/12/14 0407 10/13/14 0337  WBC 8.8 11.5* 7.5 7.5 7.9  NEUTROABS  --  8.9*  --   --   --   HGB 8.4* 9.5* 8.2* 7.9* 8.4*  HCT 26.4* 29.7* 25.6* 25.2* 26.6*  MCV 92.3 92.0 92.8 93.3 92.4  PLT 180 218 247 197 215   Cardiac Enzymes: No results for input(s): CKTOTAL, CKMB, CKMBINDEX, TROPONINI in the last 168 hours. BNP (last 3  results) No results for input(s): BNP in the last 8760 hours.  ProBNP (last 3 results) No results for input(s): PROBNP in the last 8760 hours.  CBG:  Recent Labs Lab 10/06/14 1617 10/07/14 0753 10/07/14 1121 10/11/14 1157 10/11/14 1632  GLUCAP 109* 89 114* 96 128*    Recent Results (from the past 240 hour(s))  Surgical pcr screen     Status: None   Collection Time: 10/10/14  3:23 PM  Result Value Ref Range Status   MRSA, PCR NEGATIVE NEGATIVE Final   Staphylococcus aureus NEGATIVE NEGATIVE Final    Comment:        The Xpert SA Assay (FDA approved for NASAL specimens in patients over 30 years of age), is one component of a comprehensive surveillance program.  Test performance has been validated by [facility] for patients greater than or equal to 62 year old. It is not intended to diagnose infection nor to guide or monitor treatment.   C difficile quick scan w PCR reflex     Status: None   Collection Time: 10/11/14  4:17 AM  Result Value Ref Range Status   C Diff antigen NEGATIVE NEGATIVE Final   C Diff toxin NEGATIVE NEGATIVE Final   C Diff interpretation Negative for toxigenic C. difficile  Final     Studies: No results found.  Scheduled Meds: . cefTRIAXone (ROCEPHIN)  IV  1 g Intravenous Q24H  . feeding supplement  1 Container Oral TID BM  . inFLIXimab (REMICADE) infusion  5 mg/kg Intravenous Once  . pantoprazole  40 mg Oral Q0600  . rosuvastatin  40 mg Oral QHS  . saccharomyces boulardii  250 mg Oral BID  . sertraline  50 mg Oral Daily  . zolpidem  5 mg Oral QHS   Continuous Infusions:    Principal Problem:   Acute ulcerative colitis with rectal bleeding Active Problems:   Essential hypertension   Hyponatremia   Abscess, perirectal s/p I&D 08/23/2014   UTI (lower urinary tract infection)   Pancolitis   Protein-calorie malnutrition, severe   Depression    Time spent: 67 minutes    Lorie Cleckley M.D. [facility] Pager [phone].  If 7PM-7AM, please contact night-coverage at [email or web address], password [facility] 10/13/2014, 2:55 PM  LOS: 3 days

## 2014-10-13 NOTE — Progress Notes (Signed)
    Progress Note   Subjective  Physical Therapy in room working with patient. Patient crying again today - feels hopeless. She slept better but still suboptimally last night despite Xanax   Objective   Vital signs in last 24 hours: Temp:  [98.4 F (36.9 C)-98.8 F (37.1 C)] 98.8 F (37.1 C) (08/26 0557) Pulse Rate:  [82-89] 84 (08/26 0800) Resp:  [16-18] 16 (08/26 0800) BP: (114-127)/(58-63) 115/63 mmHg (08/26 0557) SpO2:  [94 %-96 %] 96 % (08/26 0800) Last BM Date: 10/13/14 General:    Pleasant black female in NAD. Physical Therapy working to help her stand. Neurologic:  Alert and oriented,  grossly normal neurologically. Psych:  Cooperative.   Lab Results:  Recent Labs  10/11/14 0349 10/12/14 0407 10/13/14 0337  WBC 7.5 7.5 7.9  HGB 8.2* 7.9* 8.4*  HCT 25.6* 25.2* 26.6*  PLT 247 197 215   BMET  Recent Labs  10/11/14 0349 10/12/14 0407 10/13/14 0337  NA 132* 135 131*  K 3.5 3.5 3.6  CL 99* 103 100*  CO2 25 26 25   GLUCOSE 102* 93 84  BUN <5* <5* <5*  CREATININE 0.52 0.44 0.44  CALCIUM 7.8* 8.0* 7.6*     Assessment / Plan:   83. 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 She is s/p I&D of abscess 08/23/14 and s/p diverting ileostomy 09/13/14.    Rectal wound healing per surgery. From a colitis standpoint patient had mild to moderate active colitis on sigmoidoscopy with biopsy this admission. Plan is to proceed a little earlier with next Remicade infusion. Ordered yesterday but IV team nurse not available to give until today.    2. Depression. Patient crying again this am. Awaiting Psych evaluation and tx. Please address sleeping problems. Anxiolytic not enough to help her sleep at night.     LOS: 3 days   Tye Savoy  10/13/2014, 9:52 AM   GI ATTENDING  Interval history and data reviewed. Agree with interval progress note as outlined above. Patient to receive Remicade infusion  today. GI will check back Monday, but we are available over the weekend (Dr. Carlean Purl) if needed.  Docia Chuck. Geri Seminole., M.D. [facility] Division of Gastroenterology

## 2014-10-14 LAB — CBC
HCT: 28.5 % — ABNORMAL LOW (ref 36.0–46.0)
Hemoglobin: 9 g/dL — ABNORMAL LOW (ref 12.0–15.0)
MCH: 29.1 pg (ref 26.0–34.0)
MCHC: 31.6 g/dL (ref 30.0–36.0)
MCV: 92.2 fL (ref 78.0–100.0)
PLATELETS: 204 10*3/uL (ref 150–400)
RBC: 3.09 MIL/uL — ABNORMAL LOW (ref 3.87–5.11)
RDW: 16.2 % — AB (ref 11.5–15.5)
WBC: 8.1 10*3/uL (ref 4.0–10.5)

## 2014-10-14 LAB — MAGNESIUM: Magnesium: 1.8 mg/dL (ref 1.7–2.4)

## 2014-10-14 LAB — BASIC METABOLIC PANEL
Anion gap: 6 (ref 5–15)
CALCIUM: 8.1 mg/dL — AB (ref 8.9–10.3)
CO2: 27 mmol/L (ref 22–32)
CREATININE: 0.49 mg/dL (ref 0.44–1.00)
Chloride: 100 mmol/L — ABNORMAL LOW (ref 101–111)
GFR calc Af Amer: >60 mL/min (ref >60)
GLUCOSE: 108 mg/dL — AB (ref 65–99)
POTASSIUM: 4.1 mmol/L (ref 3.5–5.1)
Sodium: 133 mmol/L — ABNORMAL LOW (ref 135–145)

## 2014-10-14 NOTE — Clinical Social Work Note (Signed)
Clinical Social Worker met with patient and family present at bedside in reference to post-acute placement/ pt's return to [facility].   Patient confirmed that she did NOT wish to return to [facility] and would prefer [facility]. Patient gave CSW premission to contact dtr, Ramona with details on discharge planning. Dtr agreeable with plan for new SNF search.   CSW to update FL-2 and start new SNF search.  Glendon Axe, MSW, LCSWA [phone] 10/14/2014 2:11 PM   .

## 2014-10-14 NOTE — Clinical Social Work Note (Signed)
FL-2 updated and clinicals faxed to SNF's in [place] Patient prefers [facility].   CSW remains available as needed.  Glendon Axe, MSW, LCSWA [phone] 10/14/2014 4:38 PM

## 2014-10-14 NOTE — Progress Notes (Signed)
[facility] PROGRESS NOTE  [name] KZS:[id number] DOB: 1940/12/16 DOA: 10/10/2014 PCP: Kandice Hams, MD  Assessment/Plan: #1 ulcerative colitis flare Per CT scan and per flexible sigmoidoscopy done 10/11/2014. Biopsy consistent with active colitis and negative for dysplasia or malignancy. Patient has been started on IV Remicade per GI. Continue full liquids. Per GI.  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 Patient has been seen in consultation by general surgery who recommended continuing twice a day dressing changes to the buttocks wound and continue routine ostomy care. Patient needs to follow-up with general surgery as outpatient.  #3 questionable UTI Urine cultures were not drawn on admission. Patient s/p IV Rocephin x 3 days. Patient with no dysuria.   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 Patient is status post IVC filter placement.  #5 severe protein calorie malnutrition Continue nutritional supplements.  #6 depression Patient depressed about medical condition. Patient has been seen in consultation by psychiatry and patient's Zoloft dose has been increased 200 mg daily. Remeron has been added to patient's regimen for sleep. Appreciate psychiatric input and recommendations.   #7 hyponatremia/hypomagnesemia Likely secondary to volume depletion. Improved with hydration. Replete magnesium. Continue IV fluids. Follow.  #8 hypertension Stable.   #9 severe deconditioning PT. Patient was in nursing facility prior to admission.  Code Status: Full Family Communication: Updated patient. No family at bedside. Disposition Plan: Once clinical improvement will likely need to go back to SNF. Per gastroenterology.   Consultants:  Gastroenterology: Dr. Henrene Pastor 10/10/2014  Gen. surgery: Dr. Marlou Starks 10/10/2014  Psychiatry:  Dr. Louretta Shorten 10/13/2014  Procedures:  CT abdomen and pelvis 10/10/2014  Flexible sigmoidoscopy Dr. Henrene Pastor 10/11/2014  Antibiotics:  IV Rocephin 10/10/2014>>>>> 10/13/2014  HPI/Subjective: Patient denies any nausea or emesis. Patient tolerating full liquids. Patient complaining of pain all over. Patient tearful. Patient endorses a little bleeding.  Objective: Filed Vitals:   10/14/14 1330  BP: 135/69  Pulse: 123  Temp: 98.2 F (36.8 C)  Resp: 18    Intake/Output Summary (Last 24 hours) at 10/14/14 1445 Last data filed at 10/14/14 1357  Gross per 24 hour  Intake    320 ml  Output    125 ml  Net    195 ml   There were no vitals filed for this visit.  Exam:   General:  NAD  Cardiovascular: RRR  Respiratory: CTAB  Abdomen: Soft, decreasing diffuse tenderness to palpation, positive bowel sounds, no rebound, no guarding  Musculoskeletal: No clubbing cyanosis or edema.  Data Reviewed: Basic Metabolic Panel:  Recent Labs Lab 10/10/14 0615 10/11/14 0349 10/12/14 0407 10/13/14 0337 10/14/14 0946  NA 133* 132* 135 131* 133*  K 3.5 3.5 3.5 3.6 4.1  CL 101 99* 103 100* 100*  CO2 24 25 26 25 27   GLUCOSE 105* 102* 93 84 108*  BUN <5* <5* <5* <5* <5*  CREATININE 0.49 0.52 0.44 0.44 0.49  CALCIUM 8.4* 7.8* 8.0* 7.6* 8.1*  MG  --   --  1.2* 1.7 1.8   Liver Function Tests:  Recent Labs Lab 10/10/14 0615  AST 37  ALT 31  ALKPHOS 86  BILITOT 0.7  PROT 5.5*  ALBUMIN 2.2*    Recent Labs Lab 10/10/14 0615  LIPASE 27   No results for input(s): AMMONIA in the last 168 hours. CBC:  Recent Labs Lab 10/10/14 0615 10/11/14 7322 10/12/14 0407 10/13/14 0254  10/14/14 0946  WBC 11.5* 7.5 7.5 7.9 8.1  NEUTROABS 8.9*  --   --   --   --   HGB 9.5* 8.2* 7.9* 8.4* 9.0*  HCT 29.7* 25.6* 25.2* 26.6* 28.5*  MCV 92.0 92.8 93.3 92.4 92.2  PLT 218 247 197 215 204   Cardiac Enzymes: No results for input(s): CKTOTAL, CKMB, CKMBINDEX, TROPONINI in the last 168  hours. BNP (last 3 results) No results for input(s): BNP in the last 8760 hours.  ProBNP (last 3 results) No results for input(s): PROBNP in the last 8760 hours.  CBG:  Recent Labs Lab 10/11/14 1157 10/11/14 1632  GLUCAP 96 128*    Recent Results (from the past 240 hour(s))  Surgical pcr screen     Status: None   Collection Time: 10/10/14  3:23 PM  Result Value Ref Range Status   MRSA, PCR NEGATIVE NEGATIVE Final   Staphylococcus aureus NEGATIVE NEGATIVE Final    Comment:        The Xpert SA Assay (FDA approved for NASAL specimens in patients over 40 years of age), is one component of a comprehensive surveillance program.  Test performance has been validated by [facility] for patients greater than or equal to 53 year old. It is not intended to diagnose infection nor to guide or monitor treatment.   C difficile quick scan w PCR reflex     Status: None   Collection Time: 10/11/14  4:17 AM  Result Value Ref Range Status   C Diff antigen NEGATIVE NEGATIVE Final   C Diff toxin NEGATIVE NEGATIVE Final   C Diff interpretation Negative for toxigenic C. difficile  Final     Studies: No results found.  Scheduled Meds: . feeding supplement (ENSURE ENLIVE)  237 mL Oral BID BM  . mirtazapine  7.5 mg Oral QHS  . pantoprazole  40 mg Oral Q0600  . rosuvastatin  40 mg Oral QHS  . saccharomyces boulardii  250 mg Oral BID  . sertraline  100 mg Oral Daily   Continuous Infusions:    Principal Problem:   Acute ulcerative colitis with rectal bleeding Active Problems:   Essential hypertension   Hyponatremia   Abscess, perirectal s/p I&D 08/23/2014   UTI (lower urinary tract infection)   Pancolitis   Protein-calorie malnutrition, severe   Depression    Time spent: 11 minutes    Shelonda Saxe M.D. [facility] Pager [phone]. If 7PM-7AM, please contact night-coverage at [email or web address], password [facility] 10/14/2014, 2:45 PM  LOS: 4 days

## 2014-10-15 LAB — CBC
HEMATOCRIT: 31.5 % — AB (ref 36.0–46.0)
Hemoglobin: 9.9 g/dL — ABNORMAL LOW (ref 12.0–15.0)
MCH: 29.2 pg (ref 26.0–34.0)
MCHC: 31.4 g/dL (ref 30.0–36.0)
MCV: 92.9 fL (ref 78.0–100.0)
PLATELETS: 235 10*3/uL (ref 150–400)
RBC: 3.39 MIL/uL — ABNORMAL LOW (ref 3.87–5.11)
RDW: 16.4 % — AB (ref 11.5–15.5)
WBC: 12.9 10*3/uL — AB (ref 4.0–10.5)

## 2014-10-15 LAB — BASIC METABOLIC PANEL
ANION GAP: 9 (ref 5–15)
BUN: 6 mg/dL (ref 6–20)
CALCIUM: 8.5 mg/dL — AB (ref 8.9–10.3)
CO2: 25 mmol/L (ref 22–32)
CREATININE: 0.55 mg/dL (ref 0.44–1.00)
Chloride: 97 mmol/L — ABNORMAL LOW (ref 101–111)
Glucose, Bld: 111 mg/dL — ABNORMAL HIGH (ref 65–99)
Potassium: 4.3 mmol/L (ref 3.5–5.1)
Sodium: 131 mmol/L — ABNORMAL LOW (ref 135–145)

## 2014-10-15 LAB — MAGNESIUM: Magnesium: 1.8 mg/dL (ref 1.7–2.4)

## 2014-10-15 MED ORDER — MIRTAZAPINE 15 MG PO TABS
15.0000 mg | ORAL_TABLET | Freq: Every day | ORAL | Status: DC
  Administered 2014-10-15 – 2014-10-16 (×2): 15 mg via ORAL
  Filled 2014-10-15 (×3): qty 1

## 2014-10-15 NOTE — Progress Notes (Signed)
[facility] PROGRESS NOTE  [name]:[id number] DOB: Nov 26, 1940 DOA: 10/10/2014 PCP: Kandice Hams, MD  Assessment/Plan: #1 ulcerative colitis flare Per CT scan and per flexible sigmoidoscopy done 10/11/2014. Biopsy consistent with active colitis and negative for dysplasia or malignancy. Continue IV Remicade per GI. Continue full liquids. Per GI.  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 Patient has been seen in consultation by general surgery who recommended continuing twice a day dressing changes to the buttocks wound and continue routine ostomy care. Patient needs to follow-up with general surgery as outpatient.  #3 questionable UTI Urine cultures were not drawn on admission. Patient s/p IV Rocephin x 3 days. Patient with no dysuria.   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 Patient is status post IVC filter placement.  #5 severe protein calorie malnutrition Continue nutritional supplements.  #6 depression Patient depressed about medical condition. Patient has been seen in consultation by psychiatry and patient's Zoloft dose has been increased to 100 mg daily. Increase Remeron to 15mg  QHS.  Appreciate psychiatric input and recommendations.   #7 hyponatremia/hypomagnesemia Likely secondary to volume depletion. Improved with hydration. Replete magnesium. NSL IV fluids. Follow.  #8 hypertension Stable.   #9 severe deconditioning PT. Patient was in nursing facility prior to admission.  Code Status: Full Family Communication: Updated patient. No family at bedside. Disposition Plan: Once clinical improvement will likely need to go back to SNF. Per gastroenterology.   Consultants:  Gastroenterology: Dr. Henrene Pastor 10/10/2014  Gen. surgery: Dr. Marlou Starks 10/10/2014  Psychiatry: Dr. Louretta Shorten  10/13/2014  Procedures:  CT abdomen and pelvis 10/10/2014  Flexible sigmoidoscopy Dr. Henrene Pastor 10/11/2014  Antibiotics:  IV Rocephin 10/10/2014>>>>> 10/13/2014  HPI/Subjective: Patient denies any nausea or emesis. Patient tolerating full liquids. Patient complaining of pain all over. Patient tearful.  Objective: Filed Vitals:   10/15/14 0625  BP: 115/67  Pulse: 87  Temp: 98.4 F (36.9 C)  Resp: 15    Intake/Output Summary (Last 24 hours) at 10/15/14 1240 Last data filed at 10/15/14 1100  Gross per 24 hour  Intake    398 ml  Output    425 ml  Net    -27 ml   There were no vitals filed for this visit.  Exam:   General:  NAD  Cardiovascular: RRR  Respiratory: CTAB  Abdomen: Soft, decreasing diffuse tenderness to palpation, positive bowel sounds, no rebound, no guarding  Musculoskeletal: No clubbing cyanosis or edema.  Data Reviewed: Basic Metabolic Panel:  Recent Labs Lab 10/11/14 0349 10/12/14 0407 10/13/14 0337 10/14/14 0946 10/15/14 0450  NA 132* 135 131* 133* 131*  K 3.5 3.5 3.6 4.1 4.3  CL 99* 103 100* 100* 97*  CO2 25 26 25 27 25   GLUCOSE 102* 93 84 108* 111*  BUN <5* <5* <5* <5* 6  CREATININE 0.52 0.44 0.44 0.49 0.55  CALCIUM 7.8* 8.0* 7.6* 8.1* 8.5*  MG  --  1.2* 1.7 1.8 1.8   Liver Function Tests:  Recent Labs Lab 10/10/14 0615  AST 37  ALT 31  ALKPHOS 86  BILITOT 0.7  PROT 5.5*  ALBUMIN 2.2*    Recent Labs Lab 10/10/14 0615  LIPASE 27   No results for input(s): AMMONIA in the last 168 hours. CBC:  Recent Labs Lab 10/10/14 0615 10/11/14 0349 10/12/14 0407 10/13/14 0337 10/14/14 0946 10/15/14 0450  WBC 11.5* 7.5 7.5 7.9 8.1 12.9*  NEUTROABS 8.9*  --   --   --   --   --   HGB 9.5* 8.2* 7.9* 8.4* 9.0* 9.9*  HCT 29.7* 25.6* 25.2* 26.6* 28.5* 31.5*  MCV 92.0 92.8 93.3 92.4 92.2 92.9  PLT 218 247 197 215 204 235   Cardiac Enzymes: No results for input(s): CKTOTAL, CKMB, CKMBINDEX, TROPONINI in the last 168 hours. BNP  (last 3 results) No results for input(s): BNP in the last 8760 hours.  ProBNP (last 3 results) No results for input(s): PROBNP in the last 8760 hours.  CBG:  Recent Labs Lab 10/11/14 1157 10/11/14 1632  GLUCAP 96 128*    Recent Results (from the past 240 hour(s))  Surgical pcr screen     Status: None   Collection Time: 10/10/14  3:23 PM  Result Value Ref Range Status   MRSA, PCR NEGATIVE NEGATIVE Final   Staphylococcus aureus NEGATIVE NEGATIVE Final    Comment:        The Xpert SA Assay (FDA approved for NASAL specimens in patients over 43 years of age), is one component of a comprehensive surveillance program.  Test performance has been validated by [facility] for patients greater than or equal to 7 year old. It is not intended to diagnose infection nor to guide or monitor treatment.   C difficile quick scan w PCR reflex     Status: None   Collection Time: 10/11/14  4:17 AM  Result Value Ref Range Status   C Diff antigen NEGATIVE NEGATIVE Final   C Diff toxin NEGATIVE NEGATIVE Final   C Diff interpretation Negative for toxigenic C. difficile  Final     Studies: No results found.  Scheduled Meds: . feeding supplement (ENSURE ENLIVE)  237 mL Oral BID BM  . mirtazapine  7.5 mg Oral QHS  . pantoprazole  40 mg Oral Q0600  . rosuvastatin  40 mg Oral QHS  . saccharomyces boulardii  250 mg Oral BID  . sertraline  100 mg Oral Daily   Continuous Infusions:    Principal Problem:   Acute ulcerative colitis with rectal bleeding Active Problems:   Essential hypertension   Hyponatremia   Abscess, perirectal s/p I&D 08/23/2014   UTI (lower urinary tract infection)   Pancolitis   Protein-calorie malnutrition, severe   Depression    Time spent: 12 minutes    THOMPSON,DANIEL M.D. [facility] Pager [phone]. If 7PM-7AM, please contact night-coverage at [email or web address], password [facility] 10/15/2014, 12:40 PM  LOS: 5 days

## 2014-10-16 ENCOUNTER — Inpatient Hospital Stay (HOSPITAL_COMMUNITY): Payer: Commercial Managed Care - HMO

## 2014-10-16 ENCOUNTER — Encounter (HOSPITAL_COMMUNITY): Payer: Self-pay | Admitting: Physician Assistant

## 2014-10-16 DIAGNOSIS — R609 Edema, unspecified: Secondary | ICD-10-CM

## 2014-10-16 DIAGNOSIS — M79606 Pain in leg, unspecified: Secondary | ICD-10-CM

## 2014-10-16 LAB — CBC
HEMATOCRIT: 28.7 % — AB (ref 36.0–46.0)
HEMOGLOBIN: 9.1 g/dL — AB (ref 12.0–15.0)
MCH: 28.8 pg (ref 26.0–34.0)
MCHC: 31.7 g/dL (ref 30.0–36.0)
MCV: 90.8 fL (ref 78.0–100.0)
Platelets: 180 10*3/uL (ref 150–400)
RBC: 3.16 MIL/uL — ABNORMAL LOW (ref 3.87–5.11)
RDW: 16.3 % — ABNORMAL HIGH (ref 11.5–15.5)
WBC: 11.5 10*3/uL — ABNORMAL HIGH (ref 4.0–10.5)

## 2014-10-16 LAB — BASIC METABOLIC PANEL
Anion gap: 9 (ref 5–15)
BUN: 9 mg/dL (ref 6–20)
CHLORIDE: 97 mmol/L — AB (ref 101–111)
CO2: 23 mmol/L (ref 22–32)
CREATININE: 0.49 mg/dL (ref 0.44–1.00)
Calcium: 8.3 mg/dL — ABNORMAL LOW (ref 8.9–10.3)
GFR calc Af Amer: >60 mL/min (ref >60)
GFR calc non Af Amer: >60 mL/min (ref >60)
GLUCOSE: 109 mg/dL — AB (ref 65–99)
Potassium: 4.6 mmol/L (ref 3.5–5.1)
Sodium: 129 mmol/L — ABNORMAL LOW (ref 135–145)

## 2014-10-16 MED ORDER — FUROSEMIDE 10 MG/ML IJ SOLN
20.0000 mg | Freq: Once | INTRAMUSCULAR | Status: AC
  Administered 2014-10-16: 20 mg via INTRAVENOUS
  Filled 2014-10-16: qty 2

## 2014-10-16 MED ORDER — SODIUM CHLORIDE 0.9 % IV SOLN: 5.0000 mg/kg | Freq: Once | INTRAVENOUS | Status: DC

## 2014-10-16 NOTE — Progress Notes (Signed)
[name] continues to complain of swelling and increased pain in her left foot and ankle. Upon assessment, I agree with her complaint, as swelling has increased and the area is more sensitive to touch than yesterday. Legs are elevated and SCD's were removed per patient's request. Thank you.

## 2014-10-16 NOTE — Progress Notes (Signed)
[name] stated that she is missing a black pair of glasses.  Staff looked in all of patient belongings, bed, linen, and entire room with no luck of finding glasses.  Called patient's family to ask if they took it home and they said that they did not.  Will continue to look for glasses.  Patient is upset that they are missing

## 2014-10-16 NOTE — Progress Notes (Signed)
Daily Rounding Note  10/16/2014, 9:12 AM  LOS: 6 days   SUBJECTIVE:       C/o pain in left foot and ankle. These are swollen.  Tolerating po, full liquid.  No nausea.  Ostomy output is loose, brown.  Some pink/blood tinged mucoid rectal discharge.  Foley catheter still in place (chronic).  OBJECTIVE:         Vital signs in last 24 hours:    Temp:  [97.8 F (36.6 C)-99.8 F (37.7 C)] 97.8 F (36.6 C) (08/29 0556) Pulse Rate:  [95-101] 95 (08/29 0556) Resp:  [16-19] 16 (08/29 0556) BP: (106-117)/(56-62) 106/62 mmHg (08/28 2114) SpO2:  [95 %-99 %] 96 % (08/29 0556) Last BM Date: 10/14/14 There were no vitals filed for this visit. General: depressed.  [place].   Heart: RRR Chest: clear bil.  No cough or dyspnea Abdomen: soft, active BS, mild non0-specific tenderness  Extremities: bil LE edema, much more marked on left foot and ankle, no redness or heat.  Both feet, legs, thighs tender and arms also tender to touch.  Neuro/Psych:  Depressed, tears in eyes, moves all 4s. Oriented x 3.   Intake/Output from previous day: 08/28 0701 - 08/29 0700 In: 418 [P.O.:418] Out: 925 [Urine:475; Stool:450]  Intake/Output this shift:    Lab Results:  Recent Labs  10/14/14 0946 10/15/14 0450 10/16/14 0302  WBC 8.1 12.9* 11.5*  HGB 9.0* 9.9* 9.1*  HCT 28.5* 31.5* 28.7*  PLT 204 235 180   BMET  Recent Labs  10/14/14 0946 10/15/14 0450 10/16/14 0302  NA 133* 131* 129*  K 4.1 4.3 4.6  CL 100* 97* 97*  CO2 27 25 23   GLUCOSE 108* 111* 109*  BUN <5* 6 9  CREATININE 0.49 0.55 0.49  CALCIUM 8.1* 8.5* 8.3*    Studies/Results: No results found.   Scheduled Meds: . feeding supplement (ENSURE ENLIVE)  237 mL Oral BID BM  . mirtazapine  15 mg Oral QHS  . pantoprazole  40 mg Oral Q0600  . rosuvastatin  40 mg Oral QHS  . saccharomyces boulardii  250 mg Oral BID  . sertraline  100 mg Oral Daily   Continuous Infusions:    PRN Meds:.acetaminophen **OR** acetaminophen, ALPRAZolam, gi cocktail, morphine injection, ondansetron **OR** ondansetron (ZOFRAN) IV, oxyCODONE  ASSESMENT:   *  IBD.  UC though with evolution to perirectal abscess and fistula, ? If this may now be Crohn's.  S/p 7/27 diverting ileostomy and abscess debridement 7/5 and 7/27. Remicade initiation 08/2014.  Due 3rd infusion on 9/1.  Flex sigs of 8/17 (for rectal bleeding) and 8/24 (for CT showing progressive colitis, ongoing BPR): mild to moderate active colitis. 8/24 biopsies: chronic, moderately active colitis which is same as biopsies at flex sig of 7/20.  Grossly these last 2 flex sigs show grossly improved colitis c/w 7/20.   *  LE swelling, tenderness (though hard to read as she is tender everywhere you touch her).  Hx Left LE DVT 08/29/14, s/p 08/30/14 IVC.  Dopplers ordered.   *  Debilitated  *  Protein calorie malnutrition?  Last prealbumin normal on 8/1 but serum protein low.  On Ensure enlive.   *  Depression, anxiety.  Zoloft dose increased and HS Remeron added this admissin per psych rec. Still significantly depressed.    PLAN   *  Keep scheduled appts for Remicade infusion and GI ROV set for 10/2014.  She missed the outpt gen srugery  appt last week, hopefully SNF can get her to her GI related appts.  As Remicade due 9/1, wonder if we should go ahead and get it done before she discharges back to SNF?  *  Soft diet. Prealbumin in AM.    Azucena Freed  10/16/2014, 9:12 AM Pager: [phone]  ________________________________________________________________________  [name] note:  I personally examined the patient, reviewed the data and agree with the assessment and plan described above.  I think it would be advisable for her to have remicade here prior to discharge to SNF due to the logistics of setting it up at another facility.  We will order.     [name], MD [facility] Pager [phone]

## 2014-10-16 NOTE — Clinical Social Work Note (Signed)
Clinical Social Work Assessment  Patient Details  Name: [name] MRN: [id number] Date of Birth: January 24, 1941  Date of referral:  10/12/14               Reason for consult:  Discharge Planning, Facility Placement (Admitted from facility.)                Permission sought to share information with:  Chartered certified accountant granted to share information::  Yes, Verbal Permission Granted  Name::     n/a  Agency::  [facility]  Relationship::  n/a  Contact Information:  n/a  Housing/Transportation Living arrangements for the past 2 months:  [place], [place] (Admitted from SNF, per report has been at [facility] for 3 days prior to admission.) Source of Information:  Patient Patient Interpreter Needed:  None Criminal Activity/Legal Involvement Pertinent to Current Situation/Hospitalization:  No - Comment as needed Significant Relationships:  Adult Children, Spouse Lives with:  Facility Resident Do you feel safe going back to the place where you live?  Yes Need for family participation in patient care:  No (Coment) (Patient able to make own decisions.)  Care giving concerns:  Not sufficient support at home   Social Worker assessment / plan:  Spoke with pt about return to [place] at time of DC  Employment status:  Retired Forensic scientist:  [employer]) PT Recommendations:  [facility] / Referral to community resources:  [place]  Patient/Family's Response to care: Pt would like to switch facilities  Patient/Family's Understanding of and Emotional Response to Diagnosis, Current Treatment, and Prognosis:  No questions or concerns at this time  Emotional Assessment Appearance:  Appears stated age Attitude/Demeanor/Rapport:  Lethargic Affect (typically observed):  Accepting, Pleasant, Quiet Orientation:  Oriented to Self, Oriented to Place, Oriented to  Time, Oriented to  Situation Alcohol / Substance use:  Not Applicable Psych involvement (Current and /or in the community):  No (Comment) (Not appropriate on this admission.)  Discharge Needs  Concerns to be addressed:  No discharge needs identified Readmission within the last 30 days:  No Current discharge risk:  None Barriers to Discharge:  No Barriers Identified   Cranford Mon, LCSW 10/16/2014, 1:22 PM

## 2014-10-16 NOTE — Care Management Important Message (Signed)
Important Message  Patient Details  Name: [name] MRN: [id number] Date of Birth: 1940-11-08   Medicare Important Message Given:  Yes-third notification given    Delorse Lek 10/16/2014, 10:53 AM

## 2014-10-16 NOTE — Progress Notes (Signed)
VASCULAR LAB PRELIMINARY  PRELIMINARY  PRELIMINARY  PRELIMINARY  Bilateral lower extremity venous duplex  completed.    Preliminary report:  Right: Indeterminate age non occlusive DVT noted in the CFV.  Occlusive DVT noted in the popliteal v, and the peroneal v.  No evidence of superficial thrombosis.  No Baker's cyst.  Left:  Acute DVT noted in the CFV, FV, popliteal v, and PTV.  No evidence of superficial thrombosis.  No Baker's cyst.   Gautam Langhorst, RVT 10/16/2014, 3:45 PM

## 2014-10-16 NOTE — Progress Notes (Signed)
CSW provided updated bed offers today- patient still unsure of choice  CSW will continue to follow.  Domenica Reamer, [place] Social Worker [phone]

## 2014-10-16 NOTE — Progress Notes (Signed)
[facility] PROGRESS NOTE  [name] YIR:[id number] DOB: 1940/05/06 DOA: 10/10/2014 PCP: Kandice Hams, MD  Assessment/Plan: #1 ulcerative colitis flare Per CT scan and per flexible sigmoidoscopy done 10/11/2014. Biopsy consistent with active colitis and negative for dysplasia or malignancy. Diet has been advanced per GI to a soft diet. Patient has been started on IV Remicade per GI. Another dose of IV Remicade to be set up per GI prior to discharge to a SNF. Patient was subsequently made appointments for Remicade infusion in the outpatient setting with GI. Per GI.  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 Patient has been seen in consultation by general surgery who recommended continuing twice a day dressing changes to the buttocks wound and continue routine ostomy care. Patient needs to follow-up with general surgery as outpatient.  #3 questionable UTI Urine cultures were not drawn on admission. Patient s/p IV Rocephin x 3 days. Patient with no dysuria.   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 Patient is status post IVC filter placement. Patient with complaints of left lower extremity swelling and pain. Will repeat Dopplers. We'll give a dose of IV Lasix. Pain management.  #5 severe protein calorie malnutrition Continue nutritional supplements.  #6 depression Patient depressed about medical condition. Patient has been seen in consultation by psychiatry and patient's Zoloft dose has been increased to 100 mg daily. Continue Remeron to 15mg  QHS for sleep.  Appreciate psychiatric input and recommendations.   #7 hyponatremia/hypomagnesemia Likely secondary to volume depletion. Sodium trending back down. Will give a dose of IV Lasix. Follow.   #8 hypertension Stable.   #9 severe deconditioning PT. Patient was in nursing  facility prior to admission.  Code Status: Full Family Communication: Updated patient and daughter at bedside. Disposition Plan: To skilled nursing facility when okay with GI.   Consultants:  Gastroenterology: Dr. Henrene Pastor 10/10/2014  Gen. surgery: Dr. Marlou Starks 10/10/2014  Psychiatry: Dr. Louretta Shorten 10/13/2014  Procedures:  CT abdomen and pelvis 10/10/2014  Flexible sigmoidoscopy Dr. Henrene Pastor 10/11/2014  Antibiotics:  IV Rocephin 10/10/2014>>>>> 10/13/2014  HPI/Subjective: Patient denies any nausea or emesis. Patient tolerating current diet. Patient complaining of left lower extremity pain and swelling. Patient is also complaining of pain all over. Patient states some improvement with abdominal pain. Patient noted to have some blood-tinged changed mucoid rectal discharge per nursing.  Objective: Filed Vitals:   10/16/14 0556  BP:   Pulse: 95  Temp: 97.8 F (36.6 C)  Resp: 16    Intake/Output Summary (Last 24 hours) at 10/16/14 1245 Last data filed at 10/16/14 1000  Gross per 24 hour  Intake    120 ml  Output   1050 ml  Net   -930 ml   There were no vitals filed for this visit.  Exam:   General:  NAD  Cardiovascular: RRR  Respiratory: CTAB  Abdomen: Soft, decreasing diffuse tenderness to palpation, positive bowel sounds, no rebound, no guarding  Musculoskeletal: No clubbing cyanosis. Left lower extremity with edema and tenderness to palpation in the calf area.  Data Reviewed: Basic Metabolic Panel:  Recent Labs Lab 10/12/14 0407 10/13/14 0337 10/14/14 0946 10/15/14 0450 10/16/14 0302  NA 135 131* 133* 131* 129*  K 3.5 3.6 4.1 4.3 4.6  CL 103 100* 100* 97* 97*  CO2 26 25 27 25 23   GLUCOSE 93 84 108* 111* 109*  BUN <5* <5* <5* 6  9  CREATININE 0.44 0.44 0.49 0.55 0.49  CALCIUM 8.0* 7.6* 8.1* 8.5* 8.3*  MG 1.2* 1.7 1.8 1.8  --    Liver Function Tests:  Recent Labs Lab 10/10/14 0615  AST 37  ALT 31  ALKPHOS 86  BILITOT 0.7  PROT 5.5*  ALBUMIN  2.2*    Recent Labs Lab 10/10/14 0615  LIPASE 27   No results for input(s): AMMONIA in the last 168 hours. CBC:  Recent Labs Lab 10/10/14 0615  10/12/14 0407 10/13/14 0337 10/14/14 0946 10/15/14 0450 10/16/14 0302  WBC 11.5*  < > 7.5 7.9 8.1 12.9* 11.5*  NEUTROABS 8.9*  --   --   --   --   --   --   HGB 9.5*  < > 7.9* 8.4* 9.0* 9.9* 9.1*  HCT 29.7*  < > 25.2* 26.6* 28.5* 31.5* 28.7*  MCV 92.0  < > 93.3 92.4 92.2 92.9 90.8  PLT 218  < > 197 215 204 235 180  < > = values in this interval not displayed. Cardiac Enzymes: No results for input(s): CKTOTAL, CKMB, CKMBINDEX, TROPONINI in the last 168 hours. BNP (last 3 results) No results for input(s): BNP in the last 8760 hours.  ProBNP (last 3 results) No results for input(s): PROBNP in the last 8760 hours.  CBG:  Recent Labs Lab 10/11/14 1157 10/11/14 1632  GLUCAP 96 128*    Recent Results (from the past 240 hour(s))  Surgical pcr screen     Status: None   Collection Time: 10/10/14  3:23 PM  Result Value Ref Range Status   MRSA, PCR NEGATIVE NEGATIVE Final   Staphylococcus aureus NEGATIVE NEGATIVE Final    Comment:        The Xpert SA Assay (FDA approved for NASAL specimens in patients over 32 years of age), is one component of a comprehensive surveillance program.  Test performance has been validated by [facility] for patients greater than or equal to 31 year old. It is not intended to diagnose infection nor to guide or monitor treatment.   C difficile quick scan w PCR reflex     Status: None   Collection Time: 10/11/14  4:17 AM  Result Value Ref Range Status   C Diff antigen NEGATIVE NEGATIVE Final   C Diff toxin NEGATIVE NEGATIVE Final   C Diff interpretation Negative for toxigenic C. difficile  Final     Studies: No results found.  Scheduled Meds: . feeding supplement (ENSURE ENLIVE)  237 mL Oral BID BM  . mirtazapine  15 mg Oral QHS  . pantoprazole  40 mg Oral Q0600  . rosuvastatin  40 mg  Oral QHS  . saccharomyces boulardii  250 mg Oral BID  . sertraline  100 mg Oral Daily   Continuous Infusions:    Principal Problem:   Acute ulcerative colitis with rectal bleeding Active Problems:   Essential hypertension   Hyponatremia   Abscess, perirectal s/p I&D 08/23/2014   UTI (lower urinary tract infection)   Pancolitis   Protein-calorie malnutrition, severe   Depression    Time spent: 72 minutes    THOMPSON,DANIEL M.D. [facility] Pager [phone]. If 7PM-7AM, please contact night-coverage at [email or web address], password [facility] 10/16/2014, 12:45 PM  LOS: 6 days

## 2014-10-17 LAB — BASIC METABOLIC PANEL
Anion gap: 7 (ref 5–15)
BUN: 10 mg/dL (ref 6–20)
CALCIUM: 8.2 mg/dL — AB (ref 8.9–10.3)
CHLORIDE: 95 mmol/L — AB (ref 101–111)
CO2: 28 mmol/L (ref 22–32)
CREATININE: 0.53 mg/dL (ref 0.44–1.00)
GFR calc non Af Amer: >60 mL/min (ref >60)
Glucose, Bld: 93 mg/dL (ref 65–99)
Potassium: 4.2 mmol/L (ref 3.5–5.1)
SODIUM: 130 mmol/L — AB (ref 135–145)

## 2014-10-17 LAB — CBC
HCT: 27.9 % — ABNORMAL LOW (ref 36.0–46.0)
Hemoglobin: 8.8 g/dL — ABNORMAL LOW (ref 12.0–15.0)
MCH: 28.8 pg (ref 26.0–34.0)
MCHC: 31.5 g/dL (ref 30.0–36.0)
MCV: 91.2 fL (ref 78.0–100.0)
PLATELETS: 241 10*3/uL (ref 150–400)
RBC: 3.06 MIL/uL — AB (ref 3.87–5.11)
RDW: 16.2 % — AB (ref 11.5–15.5)
WBC: 11.2 10*3/uL — AB (ref 4.0–10.5)

## 2014-10-17 LAB — PREALBUMIN: Prealbumin: 3.9 mg/dL — ABNORMAL LOW (ref 18–38)

## 2014-10-17 MED ORDER — ACETAMINOPHEN 500 MG PO TABS: 500.0000 mg | ORAL_TABLET | ORAL | Status: AC | PRN

## 2014-10-17 MED ORDER — ONDANSETRON HCL 4 MG PO TABS: 4.0000 mg | ORAL_TABLET | Freq: Four times a day (QID) | ORAL | Status: AC | PRN

## 2014-10-17 MED ORDER — SERTRALINE HCL 100 MG PO TABS: 100.0000 mg | ORAL_TABLET | Freq: Every day | ORAL | Status: AC

## 2014-10-17 MED ORDER — OXYCODONE HCL 5 MG PO TABS: 5.0000 mg | ORAL_TABLET | ORAL | Status: DC | PRN

## 2014-10-17 MED ORDER — FUROSEMIDE 10 MG/ML IJ SOLN
20.0000 mg | Freq: Once | INTRAMUSCULAR | Status: AC
  Administered 2014-10-17: 20 mg via INTRAVENOUS
  Filled 2014-10-17: qty 2

## 2014-10-17 MED ORDER — ALPRAZOLAM 0.5 MG PO TABS: 0.5000 mg | ORAL_TABLET | Freq: Two times a day (BID) | ORAL | Status: DC | PRN

## 2014-10-17 MED ORDER — ENSURE ENLIVE PO LIQD: 237.0000 mL | Freq: Two times a day (BID) | ORAL | Status: DC

## 2014-10-17 MED ORDER — MIRTAZAPINE 15 MG PO TABS: 15.0000 mg | ORAL_TABLET | Freq: Every day | ORAL | Status: AC

## 2014-10-17 MED ORDER — PANTOPRAZOLE SODIUM 40 MG PO TBEC: 40.0000 mg | DELAYED_RELEASE_TABLET | Freq: Every day | ORAL | Status: AC

## 2014-10-17 NOTE — Clinical Social Work Note (Signed)
Patient to be d/c'ed today to [facility] skilled nursing facility.  Patient and family agreeable to plans will transport via ems RN to call report.  Patient's daughter Donnald Garre has been notified.  Evette Cristal, MSW, [place]

## 2014-10-17 NOTE — Discharge Summary (Signed)
Physician Discharge Summary  [name]:[id number] DOB: 1941/02/02 DOA: 10/10/2014  PCP: Kandice Hams, MD  Admit date: 10/10/2014 Discharge date: 10/17/2014  Time spent: 70 minutes  Recommendations for Outpatient Follow 1. Follow up at [facility] on 10/19/2014 for Remicade infusion. 2. Follow-up with Edward Jolly, PA, gastroenterology 10/31/2014.  3. Follow-up with Dr. Barry Dienes of general surgery on 11/10/2014 for evaluation and follow-up on perirectal abscesses. 4. Per gastroenterology patient will continue to have minor rectal bleeding with colitis and patient should not be sent to the ER unless this chronic minor rectal bleeding significantly worsens. 5. Follow-up with M.D. at skilled nursing facility. Patient will need a basic metabolic profile done one week post discharge.   Discharge Diagnoses:  Principal Problem:   Acute ulcerative colitis with rectal bleeding Active Problems:   Essential hypertension   Hyponatremia   Abscess, perirectal s/p I&D 08/23/2014   UTI (lower urinary tract infection)   Pancolitis   Protein-calorie malnutrition, severe   Depression   Discharge Condition: stable and improved.  Diet recommendation: Soft diet  Filed Weights   10/16/14 1700  Weight: 90.719 kg (200 lb)    History of present illness:  Per Dr Thornton Park [name] is a 74 y.o. female with a history of ulcerative colitis with multiple hospitalizations over the past several months, having a history of perianal abscess status post debridement of perianal wound with laparoscopic diverticula ileostomy performed on 09/13/2014, recently admitted to the medicine service on 09/29/2014 and discharged on 10/07/2014 at which time she presented with rectal bleeding. During that hospitalization she was seen by GI and underwent colonoscopy on 10/04/2014, found to have mild to moderate mucosal erythema throughout left colon with small amount of old blood. There was no active bleeding seen. She  was discharged to skilled nursing facility on prednisone taper. She has been treated with several courses of antimicrobial therapy and steroids over the last several months. On the day of admission, she was transferred from her skilled nursing facility to the emergency department for further workup of lower abdominal pain associate with episode of bloody stools. Patient remains significantly deconditioned, unable to ambulate, requiring assistance with most activities of daily living. She was worked up with a CT scan of abdomen and pelvis that revealed pancolitis could be consistent with active ulcerative colitis or infectious per radiology. Dr Coralyn Pear discussed case with Theodis Aguas of [facility] who will consult.    Hospital Course:  #1 ulcerative colitis flare Patient was admitted with lower abdominal pain associated with episodes of bloody stools. CT of the abdomen and pelvis which was done revealed pancolitis with concerns for an active ulcerative colitis flare versus an infectious colitis. Gastroenterology was consulted and followed the patient throughout the hospitalization. Patient subsequently underwent a flexible sigmoidoscopy on 10/11/2014. Per CT scan and per flexible sigmoidoscopy done 10/11/2014. Biopsy consistent with active colitis and negative for dysplasia or malignancy. Patient was subsequently started on clear liquids and diet advanced to a soft diet which she tolerated. Patient received 2 infusions of IV Remicade. Patient's abdominal pain improved. Patient had a decrease in bloody stools however patient's hemoglobin remained stable throughout the hospitalization. Patient improved clinically and patient be discharged to a skilled nursing facility. Patient will be set up for Remicade infusions as outpatient per gastroenterology first infusion being on 10/19/2014 [facility]. Patient will also follow-up with Dr. Deatra Ina  gastroenterology in about 6-8 weeks. It is felt by GI that patient will likely continue to have  minor rectal bleeding as it colitis slowly heals which precludes blood thinners. Is also felt that patient should not be sent to the ER unless this chronic minor rectal bleeding significantly worsens. Patient will be followed with GI as outpatient.   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 Patient has been seen in consultation by general surgery who recommended continuing twice a day dressing changes to the buttocks wound and continue routine ostomy care. Patient needs to follow-up with general surgery as outpatient.  #3 questionable UTI Urine cultures were not drawn on admission. Patient s/p IV Rocephin x 3 days. Patient with no dysuria.   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 Patient is status post IVC filter placement. Patient with complaints of left lower extremity swelling and pain. Lower extremity Dopplers were done which showed indeterminant age nonocclusive DVT noted in the common femoral vein on the right. Occlusive DVT noted in the popliteal vein and the peroneal vein on the right. No superficial thrombus. No Baker's cyst. Left lower extremity did show an acute DVT noted in the common femoral vein, femoral vein, popliteal vein, and PTV. Patient noted to have recurrent DVTs likely secondary to her ulcerative colitis and immobility. Patient remained chest pain-free with no shortness of breath. Patient was given some IV Lasix. Pain management. Patient is status post IVC filter. Outpatient follow-up.   #5 severe protein calorie malnutrition Continued on nutritional supplements.  #6 depression Patient depressed about medical condition. Patient has been seen in consultation by psychiatry and patient's Zoloft dose has been increased to 100 mg  daily. Patient was started on low-dose Remeron at 7.5 mg for insomnia and this was increased to 15 mg daily at bedtime with improvement in her insomnia.   #7 hyponatremia/hypomagnesemia Likely secondary to volume depletion. Patient was initially hydrated with IV fluids with some improvement with the hyponatremia. Patient's sodium started to tremble back down. Patient was diuresed with IV Lasix with improvement with her hyponatremia. Patient's magnesium was repleted. Patient will be discharged in stable condition.   #8 hypertension Remained stable.   #9 severe deconditioning Patient was seen by physical therapy during the hospitalization and recommended patient to return to skilled nursing facility.   Procedures:  CT abdomen and pelvis 10/10/2014  Flexible sigmoidoscopy Dr. Henrene Pastor 10/11/2014  Lower extremity Dopplers 10/16/2014  Consultations:  Gastroenterology: Dr. Henrene Pastor 10/10/2014  Gen. surgery: Dr. Marlou Starks 10/10/2014  Psychiatry: Dr. Louretta Shorten 10/13/2014    Discharge Exam: Filed Vitals:   10/17/14 0657  BP: 123/69  Pulse: 110  Temp: 98 F (36.7 C)  Resp: 18    General: NAD Cardiovascular: RRR Respiratory: CTAB  Discharge Instructions   Discharge Instructions    Diet general    Complete by:  As directed   Soft diet     Discharge instructions    Complete by:  As directed   Patient to go for remicade infusion at [facility] on 10/19/2014 Follow up with Dr Deatra Ina in 6 weeks. Follow up Dr Barry Dienes.     Increase activity slowly    Complete by:  As directed           Current Discharge Medication List    START taking these medications   Details  mirtazapine (REMERON) 15 MG tablet Take 1 tablet (15 mg total) by mouth at bedtime. Qty: 30 tablet, Refills: 0    ondansetron (ZOFRAN) 4  MG tablet Take 1 tablet (4 mg total) by mouth every 6 (six) hours as needed for nausea. Qty: 20 tablet, Refills: 0    oxyCODONE (OXY IR/ROXICODONE) 5 MG immediate release  tablet Take 1 tablet (5 mg total) by mouth every 4 (four) hours as needed for moderate pain. Qty: 20 tablet, Refills: 0    pantoprazole (PROTONIX) 40 MG tablet Take 1 tablet (40 mg total) by mouth daily at 6 (six) AM. Qty: 30 tablet, Refills: 0      CONTINUE these medications which have CHANGED   Details  acetaminophen (TYLENOL) 500 MG tablet Take 1 tablet (500 mg total) by mouth every 4 (four) hours as needed for mild pain or moderate pain. Qty: 30 tablet, Refills: 0    ALPRAZolam (XANAX) 0.5 MG tablet Take 1 tablet (0.5 mg total) by mouth 2 (two) times daily as needed for anxiety. Qty: 20 tablet, Refills: 0    feeding supplement, ENSURE ENLIVE, (ENSURE ENLIVE) LIQD Take 237 mLs by mouth 2 (two) times daily between meals. Qty: 237 mL, Refills: 12    sertraline (ZOLOFT) 100 MG tablet Take 1 tablet (100 mg total) by mouth daily. Qty: 30 tablet, Refills: 0      CONTINUE these medications which have NOT CHANGED   Details  CRESTOR 40 MG tablet TAKE 1 TABLET AT BEDTIME Qty: 90 tablet, Refills: 2    psyllium (HYDROCIL/METAMUCIL) 95 % PACK Take 1 packet by mouth 2 (two) times daily. Qty: 56 each, Refills: 0    saccharomyces boulardii (FLORASTOR) 250 MG capsule Take 1 capsule (250 mg total) by mouth 2 (two) times daily. Qty: 60 capsule, Refills: 0      STOP taking these medications     mesalamine (LIALDA) 1.2 G EC tablet      predniSONE (DELTASONE) 1 MG tablet      predniSONE (DELTASONE) 1 MG tablet        Allergies  Allergen Reactions  . Clindamycin/Lincomycin Diarrhea    Leads to colitis flare  . Ivp Dye [Iodinated Diagnostic Agents] Other (See Comments)    "almost passed out"   Follow-up Information    Follow up with [facility], MD On 11/10/2014.   Specialty:  General Surgery   Why:  12:00pm, arrive no later than 11:30am for paperwork   Contact information:   [place] [address] [phone]       Follow up with [facility] On 10/19/2014.   Why:  arrive by 9 AM for 10 AM appt for medication (Remicade) infusion.     Contact information:   [place] [phone]      Follow up with Nicoletta Ba, PA-C On 10/31/2014.   Specialty:  Gastroenterology   Why:  10 AM appt with GI provider.     Contact information:   [place] [address] [phone]       Please follow up.   Why:  Follow-up with M.D. at skilled nursing facility       The results of significant diagnostics from this hospitalization (including imaging, microbiology, ancillary and laboratory) are listed below for reference.    Significant Diagnostic Studies: Ct Abdomen Pelvis Wo Contrast  10/10/2014   CLINICAL DATA:  Ulcerative colitis and recent rectal abscess. Abdominal pain and bloody stools.  EXAM: CT ABDOMEN AND PELVIS WITHOUT CONTRAST  TECHNIQUE: Multidetector CT imaging of the abdomen and pelvis was performed following the standard protocol without  IV contrast.  COMPARISON:  09/29/2014  FINDINGS: BODY WALL: A presumably packed defect in the right gluteal fold which tracks to the thickened right levator is more distended than before. There is no tracking subcutaneous gas or evidence of undrained fluid collection.  Ostomy in the right lower quadrant for loop ileostomy.  LOWER CHEST: Extensive coronary atherosclerosis, status post CABG. Prominent calcification of the aortic root wall with apparent dilatation at least partly related to obliquity of imaging.  ABDOMEN/PELVIS:  Liver: Subtle undulation the liver surface but no other morphologic changes of cirrhosis.  Biliary: Cholecystectomy. Common bile duct enlargement is chronic and stable.  Pancreas: Stable borderline dilatation of the main pancreatic duct at 3 mm. No active inflammatory changes.  Spleen: Unremarkable.  Adrenals: Unremarkable.  Kidneys and ureters: Hilar calcifications are arterial. Bilateral lobulated renal  cortex consistent with scarring on postcontrast imaging comparison. 22 mm left renal cyst. No hydronephrosis.  Bladder: Decompressed by a Foley catheter.  Reproductive: Hysterectomy.  Negative adnexa.  Pelvic floor laxity.  Bowel: Pan colonic thickening even when accounting for underdistention with submucosal low density either from edema and/or fat. Mild haziness of the surrounding fat confirms inflammation. No small bowel obstruction or inflammatory change. No appendicitis.  Retroperitoneum: No mass or adenopathy.  Peritoneum: No ascites or pneumoperitoneum.  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  OSSEOUS: No acute finding. Lower lumbar facet arthropathy with low grade 1 anterolisthesis at L4-5.  IMPRESSION: 1. Pancolitis which could be active ulcerative colitis or infectious. 2. Packed right perianal and ischiorectal fossa abscess. No evidence of undrained fluid collection.   Electronically Signed   By: Monte Fantasia M.D.   On: 10/10/2014 08:03   Ct Abdomen Pelvis W Contrast  09/30/2014   CLINICAL DATA:  Rectal bleeding and history of recent rectal abscess.  EXAM: CT ABDOMEN AND PELVIS WITH CONTRAST  TECHNIQUE: Multidetector CT imaging of the abdomen and pelvis was performed using the standard protocol following bolus administration of intravenous contrast.  CONTRAST:  172mL OMNIPAQUE IOHEXOL 300 MG/ML  SOLN  COMPARISON:  CT scan of August 22, 2014.  FINDINGS: Visualized lung bases are unremarkable. No significant osseous abnormality is noted.  Status post cholecystectomy. Probable focal fatty infiltration is seen involving the inferior and anterior portion of the right hepatic lobe. The spleen and pancreas appear normal. Atherosclerosis of abdominal aorta is noted without aneurysm formation. IVC filter is seen in infrarenal position. Adrenal glands appear normal. No hydronephrosis or renal obstruction is noted. Simple left renal cyst is noted. Bilateral cortical scarring  is seen involving the kidneys. The appendix appears normal. Ostomy is seen in the right lower quadrant. No evidence of bowel obstruction is noted. No abnormal fluid collection is noted. Urinary bladder is decompressed secondary to Foley catheter. Status post hysterectomy. Ovaries appear normal. No significant adenopathy is noted.  IMPRESSION: Atherosclerosis of abdominal aorta without aneurysm formation.  Ostomy is seen in right lower quadrant. No evidence of bowel obstruction or abscess is noted.  No acute abnormality is noted in the abdomen or pelvis.   Electronically Signed   By: Marijo Conception, M.D.   On: 09/30/2014 09:00    Microbiology: Recent Results (from the past 240 hour(s))  Surgical pcr screen     Status: None   Collection Time: 10/10/14  3:23 PM  Result Value Ref Range Status   MRSA, PCR NEGATIVE NEGATIVE Final   Staphylococcus aureus NEGATIVE NEGATIVE Final    Comment:  The Xpert SA Assay (FDA approved for NASAL specimens in patients over 21 years of age), is one component of a comprehensive surveillance program.  Test performance has been validated by [facility] for patients greater than or equal to 46 year old. It is not intended to diagnose infection nor to guide or monitor treatment.   C difficile quick scan w PCR reflex     Status: None   Collection Time: 10/11/14  4:17 AM  Result Value Ref Range Status   C Diff antigen NEGATIVE NEGATIVE Final   C Diff toxin NEGATIVE NEGATIVE Final   C Diff interpretation Negative for toxigenic C. difficile  Final     Labs: Basic Metabolic Panel:  Recent Labs Lab 10/12/14 0407 10/13/14 0337 10/14/14 0946 10/15/14 0450 10/16/14 0302 10/17/14 0331  NA 135 131* 133* 131* 129* 130*  K 3.5 3.6 4.1 4.3 4.6 4.2  CL 103 100* 100* 97* 97* 95*  CO2 26 25 27 25 23 28   GLUCOSE 93 84 108* 111* 109* 93  BUN <5* <5* <5* 6 9 10   CREATININE 0.44 0.44 0.49 0.55 0.49 0.53  CALCIUM 8.0* 7.6* 8.1* 8.5* 8.3* 8.2*  MG 1.2* 1.7  1.8 1.8  --   --    Liver Function Tests: No results for input(s): AST, ALT, ALKPHOS, BILITOT, PROT, ALBUMIN in the last 168 hours. No results for input(s): LIPASE, AMYLASE in the last 168 hours. No results for input(s): AMMONIA in the last 168 hours. CBC:  Recent Labs Lab 10/13/14 0337 10/14/14 0946 10/15/14 0450 10/16/14 0302 10/17/14 0331  WBC 7.9 8.1 12.9* 11.5* 11.2*  HGB 8.4* 9.0* 9.9* 9.1* 8.8*  HCT 26.6* 28.5* 31.5* 28.7* 27.9*  MCV 92.4 92.2 92.9 90.8 91.2  PLT 215 204 235 180 241   Cardiac Enzymes: No results for input(s): CKTOTAL, CKMB, CKMBINDEX, TROPONINI in the last 168 hours. BNP: BNP (last 3 results) No results for input(s): BNP in the last 8760 hours.  ProBNP (last 3 results) No results for input(s): PROBNP in the last 8760 hours.  CBG:  Recent Labs Lab 10/11/14 1157 10/11/14 1632  GLUCAP 96 128*       Signed:  THOMPSON,DANIEL MD [facility] 10/17/2014, 3:19 PM

## 2014-10-17 NOTE — Clinical Social Work Note (Addendum)
CSW met with patient and her family who were at bedside.  Patient and family have decided they would like to go back to [facility] for short term rehab once discharge orders have been received and patient is medically ready, FL2 on chart awaiting signature by physician.  Jones Broom. [place], MSW, [place] 10/17/2014 11:52 AM

## 2014-10-17 NOTE — Progress Notes (Signed)
Physical Therapy Treatment Patient Details Name: [name] MRN: [id number] DOB: 1940/11/06 Today's Date: 10/17/2014    History of Present Illness Patient is a 74 yo female who presents with persistent colitis. recently admitted on 09/29/14 from [facility] due to rectal bleeding.  B Pt also has diverting ileostomy.  PMH inculdes ulcerative colitis, CVA, (B) DVTs, perirectal abscess.     PT Comments    Pt was able to assist somewhat with transition to bedside, but is very immobile in her hips to allow safe bedside sitting.  Her ROM to LE's was helpful to start as pt was so stiff she would not agree even to bedside sitting.  Her plan is to continue toward SNF to restore her previous gait ability.    Follow Up Recommendations  SNF     Equipment Recommendations  None recommended by PT    Recommendations for Other Services       Precautions / Restrictions Precautions Precautions: Fall Precaution Comments: ostomy, sacral wound Restrictions Weight Bearing Restrictions: No    Mobility  Bed Mobility Overal bed mobility: Needs Assistance Bed Mobility: Supine to Sit;Sit to Supine   Sidelying to sit: Max assist Supine to sit: Max assist;+2 for physical assistance;+2 for safety/equipment Sit to supine: Max assist;+2 for physical assistance;+2 for safety/equipment   General bed mobility comments: cannot flex in her trunk well enough to sit up bedside, therefore she is struggling to move  Transfers                 General transfer comment: pt declined  Ambulation/Gait             General Gait Details: unable   Stairs            Wheelchair Mobility    Modified Rankin (Stroke Patients Only)       Balance     Sitting balance-Leahy Scale: Poor Sitting balance - Comments: short time bedside until pt asked to stop                            Cognition Arousal/Alertness: Awake/alert Behavior During Therapy: Anxious Overall Cognitive  Status: History of cognitive impairments - at baseline       Memory: Decreased recall of precautions;Decreased short-term memory              Exercises General Exercises - Lower Extremity Ankle Circles/Pumps: AROM;Both;20 reps Quad Sets: AROM;Both;10 reps Heel Slides: AAROM;Both;10 reps Hip ABduction/ADduction: AAROM;Both;10 reps    General Comments General comments (skin integrity, edema, etc.): L ankle in very edematous condition and in pain      Pertinent Vitals/Pain Pain Assessment: Faces Faces Pain Scale: Hurts whole lot Pain Location: LLE and abdomen Pain Intervention(s): Premedicated before session;Repositioned;Limited activity within patient's tolerance    Home Living                      Prior Function            PT Goals (current goals can now be found in the care plan section) Acute Rehab PT Goals Patient Stated Goal: to get walking again Progress towards PT goals: Progressing toward goals    Frequency  Min 2X/week    PT Plan Current plan remains appropriate    Co-evaluation             End of Session     Patient left: in bed;with call bell/phone within reach  Time: 5834-6219 PT Time Calculation (min) (ACUTE ONLY): 29 min  Charges:  $Therapeutic Exercise: 8-22 mins $Therapeutic Activity: 8-22 mins                    G Codes:      Ramond Dial 11-15-14, 4:58 PM   Mee Hives, PT MS Acute Rehab Dept. Number: [facility] [phone] and [facility] [phone]

## 2014-10-17 NOTE — Progress Notes (Signed)
[name] to be D/C'd Skilled nursing facility: [name] per MD order.  Discussed with the patient/family and all questions fully answered.  VSS, Buttocks dressing intact and was changed during this shift per MD order.    IV catheter discontinued intact. Site without signs and symptoms of complications. Dressing and pressure applied.  SNF packet prepared by Education officer, museum and sent with patient to facility. Packet includes prescriptions.  Called report to [place], Therapist, sports at [facility]. Family aware of transport.  Patient to be escorted via stretcher, and D/C'd to [facility] via EMS transport.  Micki Riley 10/17/2014 4:59 PM

## 2014-10-17 NOTE — Progress Notes (Signed)
Daily Rounding Note  10/17/2014, 9:23 AM  LOS: 7 days   SUBJECTIVE:       Still passing some streaks of blood PR.  Overall pain is better.  No abdominal pain.  LE pain improved.   She admits to feeling better than yesterday when it was "rough" No nausea.  Appetite improving, and says she ate most of her grits, sausage, eggs, toast this AM  OBJECTIVE:         Vital signs in last 24 hours:    Temp:  [98 F (36.7 C)-98.7 F (37.1 C)] 98 F (36.7 C) (08/30 0657) Pulse Rate:  [110-117] 110 (08/30 0657) Resp:  [18-20] 18 (08/30 0657) BP: (115-123)/(63-84) 123/69 mmHg (08/30 0657) SpO2:  [99 %] 99 % (08/30 0657) Weight:  [200 lb (90.719 kg)] 200 lb (90.719 kg) (08/29 1700) Last BM Date: 10/14/14 Filed Weights   10/16/14 1700  Weight: 200 lb (90.719 kg)   General: looks brighter, comfortable.  Still looks unwell   Heart: RRR Chest: clear bil.  No cough or dyspnea at rest Abdomen: soft, NT.  BS active.  Brown, liquid stool in ostomy.   Extremities: swelling/edema in both LE, left >> right. Significantly less LE tenderness.  Neuro/Psych:  Pleasant, depression improved, not moaning.  Moving all 4s, strength not tested. More engaged.    Intake/Output from previous day: 08/29 0701 - 08/30 0700 In: 985 [P.O.:240; I.V.:745] Out: 1025 [Urine:900; Stool:125]  Intake/Output this shift:    Lab Results:  Recent Labs  10/15/14 0450 10/16/14 0302 10/17/14 0331  WBC 12.9* 11.5* 11.2*  HGB 9.9* 9.1* 8.8*  HCT 31.5* 28.7* 27.9*  PLT 235 180 241  MCV                                         91  BMET  Recent Labs  10/15/14 0450 10/16/14 0302 10/17/14 0331  NA 131* 129* 130*  K 4.3 4.6 4.2  CL 97* 97* 95*  CO2 25 23 28   GLUCOSE 111* 109* 93  BUN 6 9 10   CREATININE 0.55 0.49 0.53  CALCIUM 8.5* 8.3* 8.2*   PT/INR No results for input(s): LABPROT, INR in the last 72 hours.  Studies/Results: No results found.    Scheduled Meds: . feeding supplement (ENSURE ENLIVE)  237 mL Oral BID BM  . mirtazapine  15 mg Oral QHS  . pantoprazole  40 mg Oral Q0600  . rosuvastatin  40 mg Oral QHS  . saccharomyces boulardii  250 mg Oral BID  . sertraline  100 mg Oral Daily   Continuous Infusions:  PRN Meds:.acetaminophen **OR** acetaminophen, ALPRAZolam, gi cocktail, morphine injection, ondansetron **OR** ondansetron (ZOFRAN) IV, oxyCODONE   ASSESMENT:   * IBD. UC though with evolution to perirectal abscess and fistula, ? If this may now be Crohn's. 7/5, 7/27 I&D perirectal abscess. 7/27 diverting ileostomy.  Remicade initiation 08/2014. Due 3rd infusion on 9/1.  Flex sigs 8/17 (for rectal bleeding) and 8/24 (for CT showing progressive colitis, ongoing BPR): mild to moderate active colitis grossly improved c/w flex sig 09/06/14. 8/24 biopsies: chronic, moderately active colitis which is same as biopsies at flex sig of 7/20.  Hospital does not receive reimbursement for inpt Remicade infusions.   * Bil DVT.  Hx Left LE DVT 08/29/14, s/p 08/30/14 IVC. Dopplers 8/30: Acute left DVT of CFV, FV,  popliteal v, and PTV.  Indeterminate non-occlusive DVT of CFV, occlusive DVT of popliteal and peroneal veins.  Not started on Heparin and anticoagulation previously discontinued due to GI bleeding.  *  Normocytic anemia. Multifactorial: chronic disease, GI blood loss.  Hgb stable.   * Debilitated  *  Hyponatremia.   * Protein calorie malnutrition? Last prealbumin normal on 8/1 but serum protein low. On Ensure enlive.   * Depression, anxiety. Zoloft dose increased and HS Remeron added this admissin per psych rec.  See definite improvement in mood, affect, appetite today.    PLAN   *  If pt still inpt on 9/1, will receive Remicade as we can not afford to delay therapy.  Otherwise will infuse as outpt on that date. Hospital is not reimbursed for inpt infusions.  *  ? How to manage what looks like recurrent,  acute DVTs?     Azucena Freed  10/17/2014, 9:23 AM Pager: [phone]   ________________________________________________________________________  [name] note:  I personally examined the patient, reviewed the data.  She is OK for d/c to rehab/SNF.  Most important is that she have her remicade infusion at [facility] in 2 days. This has already be scheduled for her.  We will make sure she has out patient follow up with Dr. Deatra Ina in 6-8 weeks.  She will likely continue to have minor rectal bleeding as the colitis slowly heals.  This precludes use of blood thinners obviously.  She should not be sent to ER unless this chronic minor rectal bleeding significantly worsens.    [name], MD [facility] Pager [phone]

## 2014-10-18 ENCOUNTER — Other Ambulatory Visit: Payer: Self-pay

## 2014-10-19 ENCOUNTER — Encounter (HOSPITAL_COMMUNITY)
Admit: 2014-10-19 | Discharge: 2014-10-19 | Disposition: A | Discharge status: Home / Self Care | Payer: Commercial Managed Care - HMO | Source: Ambulatory Visit | Attending: Gastroenterology | Admitting: Gastroenterology

## 2014-10-19 ENCOUNTER — Emergency Department (HOSPITAL_COMMUNITY): Payer: Commercial Managed Care - HMO

## 2014-10-19 ENCOUNTER — Emergency Department (HOSPITAL_COMMUNITY)
Admission: EM | Admit: 2014-10-19 | Discharge: 2014-10-19 | Disposition: A | Discharge status: Skilled Nursing Facility | Payer: Commercial Managed Care - HMO | Attending: Emergency Medicine | Admitting: Emergency Medicine

## 2014-10-19 ENCOUNTER — Encounter (HOSPITAL_COMMUNITY): Payer: Self-pay

## 2014-10-19 ENCOUNTER — Encounter (HOSPITAL_COMMUNITY): Payer: Self-pay | Admitting: Emergency Medicine

## 2014-10-19 DIAGNOSIS — K604 Rectal fistula: Secondary | ICD-10-CM | POA: Diagnosis not present

## 2014-10-19 DIAGNOSIS — I10 Essential (primary) hypertension: Secondary | ICD-10-CM | POA: Insufficient documentation

## 2014-10-19 DIAGNOSIS — E119 Type 2 diabetes mellitus without complications: Secondary | ICD-10-CM | POA: Insufficient documentation

## 2014-10-19 DIAGNOSIS — Z8639 Personal history of other endocrine, nutritional and metabolic disease: Secondary | ICD-10-CM | POA: Insufficient documentation

## 2014-10-19 DIAGNOSIS — Z87891 Personal history of nicotine dependence: Secondary | ICD-10-CM | POA: Diagnosis not present

## 2014-10-19 DIAGNOSIS — M1990 Unspecified osteoarthritis, unspecified site: Secondary | ICD-10-CM | POA: Insufficient documentation

## 2014-10-19 DIAGNOSIS — K50113 Crohn's disease of large intestine with fistula: Secondary | ICD-10-CM | POA: Insufficient documentation

## 2014-10-19 DIAGNOSIS — I824Z3 Acute embolism and thrombosis of unspecified deep veins of distal lower extremity, bilateral: Secondary | ICD-10-CM | POA: Insufficient documentation

## 2014-10-19 DIAGNOSIS — I82403 Acute embolism and thrombosis of unspecified deep veins of lower extremity, bilateral: Secondary | ICD-10-CM

## 2014-10-19 DIAGNOSIS — K219 Gastro-esophageal reflux disease without esophagitis: Secondary | ICD-10-CM | POA: Diagnosis not present

## 2014-10-19 DIAGNOSIS — Z86010 Personal history of colonic polyps: Secondary | ICD-10-CM | POA: Diagnosis not present

## 2014-10-19 DIAGNOSIS — I2510 Atherosclerotic heart disease of native coronary artery without angina pectoris: Secondary | ICD-10-CM | POA: Diagnosis not present

## 2014-10-19 DIAGNOSIS — Z79899 Other long term (current) drug therapy: Secondary | ICD-10-CM | POA: Insufficient documentation

## 2014-10-19 DIAGNOSIS — Z872 Personal history of diseases of the skin and subcutaneous tissue: Secondary | ICD-10-CM | POA: Insufficient documentation

## 2014-10-19 DIAGNOSIS — F329 Major depressive disorder, single episode, unspecified: Secondary | ICD-10-CM | POA: Insufficient documentation

## 2014-10-19 DIAGNOSIS — R2243 Localized swelling, mass and lump, lower limb, bilateral: Secondary | ICD-10-CM | POA: Diagnosis present

## 2014-10-19 LAB — BASIC METABOLIC PANEL
Anion gap: 10 (ref 5–15)
BUN: 9 mg/dL (ref 6–20)
CHLORIDE: 92 mmol/L — AB (ref 101–111)
CO2: 27 mmol/L (ref 22–32)
CREATININE: 0.53 mg/dL (ref 0.44–1.00)
Calcium: 8.3 mg/dL — ABNORMAL LOW (ref 8.9–10.3)
GFR calc Af Amer: >60 mL/min (ref >60)
GLUCOSE: 76 mg/dL (ref 65–99)
Potassium: 4 mmol/L (ref 3.5–5.1)
SODIUM: 129 mmol/L — AB (ref 135–145)

## 2014-10-19 LAB — CBC WITH DIFFERENTIAL/PLATELET
Basophils Absolute: 0 10*3/uL (ref 0.0–0.1)
Basophils Relative: 0 % (ref 0–1)
EOS ABS: 0.1 10*3/uL (ref 0.0–0.7)
EOS PCT: 1 % (ref 0–5)
HCT: 28.3 % — ABNORMAL LOW (ref 36.0–46.0)
Hemoglobin: 9.1 g/dL — ABNORMAL LOW (ref 12.0–15.0)
LYMPHS ABS: 2.4 10*3/uL (ref 0.7–4.0)
Lymphocytes Relative: 21 % (ref 12–46)
MCH: 29.1 pg (ref 26.0–34.0)
MCHC: 32.2 g/dL (ref 30.0–36.0)
MCV: 90.4 fL (ref 78.0–100.0)
MONOS PCT: 8 % (ref 3–12)
Monocytes Absolute: 0.9 10*3/uL (ref 0.1–1.0)
Neutro Abs: 7.8 10*3/uL — ABNORMAL HIGH (ref 1.7–7.7)
Neutrophils Relative %: 70 % (ref 43–77)
PLATELETS: 292 10*3/uL (ref 150–400)
RBC: 3.13 MIL/uL — ABNORMAL LOW (ref 3.87–5.11)
RDW: 16.2 % — ABNORMAL HIGH (ref 11.5–15.5)
WBC: 11.3 10*3/uL — ABNORMAL HIGH (ref 4.0–10.5)

## 2014-10-19 LAB — I-STAT CG4 LACTIC ACID, ED: LACTIC ACID, VENOUS: 0.84 mmol/L (ref 0.5–2.0)

## 2014-10-19 MED ORDER — ONDANSETRON HCL 4 MG/2ML IJ SOLN
4.0000 mg | Freq: Once | INTRAMUSCULAR | Status: AC
  Administered 2014-10-19: 4 mg via INTRAVENOUS
  Filled 2014-10-19: qty 2

## 2014-10-19 MED ORDER — SODIUM CHLORIDE 0.9 % IV SOLN
INTRAVENOUS | Status: DC
  Administered 2014-10-19: 16:00:00 via INTRAVENOUS

## 2014-10-19 MED ORDER — FENTANYL CITRATE (PF) 100 MCG/2ML IJ SOLN
50.0000 ug | Freq: Once | INTRAMUSCULAR | Status: AC
  Administered 2014-10-19: 50 ug via INTRAVENOUS
  Filled 2014-10-19: qty 2

## 2014-10-19 MED ORDER — OXYCODONE HCL 5 MG PO TABS: 5.0000 mg | ORAL_TABLET | Freq: Four times a day (QID) | ORAL | Status: DC

## 2014-10-19 MED ORDER — OXYCODONE-ACETAMINOPHEN 5-325 MG PO TABS
1.0000 | ORAL_TABLET | Freq: Once | ORAL | Status: AC
  Administered 2014-10-19: 1 via ORAL
  Filled 2014-10-19: qty 1

## 2014-10-19 MED ORDER — FENTANYL CITRATE (PF) 100 MCG/2ML IJ SOLN
100.0000 ug | Freq: Once | INTRAMUSCULAR | Status: AC
Start: 2014-10-19 — End: 2014-10-19
  Administered 2014-10-19: 100 ug via INTRAVENOUS
  Filled 2014-10-19: qty 2

## 2014-10-19 NOTE — ED Provider Notes (Signed)
CSN: [id number]     Arrival date & time 10/19/14  1437 History   First MD Initiated Contact with Patient 10/19/14 1510     Chief Complaint  Patient presents with  . Knee Pain  . Leg Swelling     (Consider location/radiation/quality/duration/timing/severity/associated sxs/prior Treatment) HPI   [name] 74 y.o.female  PCP: Kandice Hams, MD  Blood pressure 116/72, pulse 86, temperature 98.3 F (36.8 C), SpO2 92 %.  SIGNIFICANT PMH: CAD, cerebrovascular disease, hyperlipidemia, HTN, ulcerative colitis, colon polyps, MI, thyroid disease, diabetes, GERD, arthritis, perirectal abscess  CHIEF COMPLAINT: left knee pain and swelling. Rectal pain  Patient to the ER, accompanied by her daughter Donnald Garre, for perirectal pain and left knee pain. The ulcer to her bottom has been there for 2 months and her knee has been hurting on and off for month.  She has history of left knee replacement approx 10-20 years ago per daughter. She was going to get her first infusion of Remicade today but did not because she was crying due to pain. She was admitted on 8/23 for a rectal abscess that has been drained and packed- being followed by surgery.  On 8/31 the patient was diagnosed with Occlusive DVT  in the popliteal vein and the peroneal vein on the right.  No superficial thrombus. No Baker's cyst. Left lower extremity did show an acute DVT noted in the common femoral vein, femoral vein, popliteal vein, and PTV.  She also has DVT to the right. 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 Denies shortness of breath and chest pain. The hospitalist recommended pain management, she has an IVC filter.   Negative ROS: Confusion, diaphoresis, fever, headache, weakness (general or focal), change of vision,  neck pain, dysphagia, aphagia, chest pain, shortness of breath,  back pain, abdominal pains, nausea, vomiting, rash.   Past Medical History  Diagnosis Date  . CAD (coronary artery  disease)     unspecified  . Cerebrovascular disease 2010    bil carotid artery disease. left CEA 02/2004. 02/2014 carotid ultrasound: < 40% bil ICA disease.   Marland Kitchen Hyperlipidemia, mixed   . HTN (hypertension)   . Ulcerative colitis 2002  . Colon polyps 2006  . Thyroid disease   . Myocardial infarct 1998  . Diabetes mellitus without complication     Borderline  . GERD (gastroesophageal reflux disease)   . Arthritis   . 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   . Depression 10/12/2014  . DVT (deep venous thrombosis) 08/29/14    left LE, s/p 08/30/14 IVC filter.    Past Surgical History  Procedure Laterality Date  . Cholecystectomy  7.06.2008    with cholangiogram  . 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 Abdominal hysterectomy    . Colonoscopy  multiple  . Sigmoidoscopy  multiple  . Flexible sigmoidoscopy N/A 08/08/2014    Procedure: FLEXIBLE SIGMOIDOSCOPY;  Surgeon: Gatha Mayer, MD;  Location: [place];  Service: Endoscopy;  Laterality: N/A;  . Incision and drainage perirectal abscess N/A 08/22/2014    Procedure: IRRIGATION AND DEBRIDEMENT PERIRECTAL ABSCESS;  Surgeon: Jackolyn Confer, MD;  Location: [facility];  Service: General;  Laterality: N/A;  . Joint replacement  left knee  . Flexible sigmoidoscopy N/A 09/06/2014    Procedure: FLEXIBLE SIGMOIDOSCOPY;  Surgeon: Lafayette Dragon, MD;  Location: [facility];  Service: Endoscopy;  Laterality: N/A;  . Laparoscopic diverted colostomy N/A 09/13/2014    Procedure: LAPAROSCOPIC DIVERTED ILEOSTOMY;  Surgeon: Stark Klein, MD;  Location: [facility];  Service: General;  Laterality: N/A;  . Rectal exam under anesthesia N/A 09/13/2014    Procedure: RECTAL EXAM UNDER ANESTHESIA  AND DEBRIDEMENT OF PERIANAL WOUND;  Surgeon: Stark Klein, MD;  Location: [facility];  Service: General;  Laterality: N/A;  . Flexible sigmoidoscopy N/A 10/04/2014    Procedure: FLEXIBLE SIGMOIDOSCOPY;  Surgeon: Inda Castle, MD;  Location: [place];  Service:  Endoscopy;  Laterality: N/A;  . Flexible sigmoidoscopy N/A 10/11/2014    Procedure: FLEXIBLE SIGMOIDOSCOPY;  Surgeon: Irene Shipper, MD;  Location: [facility];  Service: Endoscopy;  Laterality: N/A;   Family History  Problem Relation Age of Onset  . Colitis Brother     1 bro with Crohn's, another bro with unspecified colitis.   . Stomach cancer Mother   . Stroke Father     died  . Colon cancer Neg Hx    Social History  Substance Use Topics  . Smoking status: Former Smoker -- 0.30 packs/day for 40 years    Types: Cigarettes    Quit date: 02/17/2013  . Smokeless tobacco: Never Used  . Alcohol Use: No   OB History    No data available     Review of Systems  10 Systems reviewed and are negative for acute change except as noted in the HPI.    Allergies  Clindamycin/lincomycin and Ivp dye  Home Medications   Prior to Admission medications   Medication Sig Start Date End Date Taking? Authorizing Provider  acetaminophen (TYLENOL) 500 MG tablet Take 1 tablet (500 mg total) by mouth every 4 (four) hours as needed for mild pain or moderate pain. Patient taking differently: Take 1,000 mg by mouth every 4 (four) hours as needed for mild pain or moderate pain.  10/17/14  Yes Eugenie Filler, MD  ALPRAZolam Duanne Moron) 0.5 MG tablet Take 1 tablet (0.5 mg total) by mouth 2 (two) times daily as needed for anxiety. 10/17/14  Yes Eugenie Filler, MD  CRESTOR 40 MG tablet TAKE 1 TABLET AT BEDTIME 05/17/14  Yes Lelon Perla, MD  feeding supplement, ENSURE ENLIVE, (ENSURE ENLIVE) LIQD Take 237 mLs by mouth 2 (two) times daily between meals. 10/17/14  Yes Eugenie Filler, MD  LIALDA 1.2 G EC tablet Take 4.8 g by mouth daily. 08/01/14  Yes Historical Provider, MD  metoprolol tartrate (LOPRESSOR) 25 MG tablet Take 1 tablet by mouth 2 (two) times daily. 08/16/14  Yes Historical Provider, MD  mirtazapine (REMERON) 15 MG tablet Take 1 tablet (15 mg total) by mouth at bedtime. 10/17/14  Yes Eugenie Filler, MD  ondansetron (ZOFRAN) 4 MG tablet Take 1 tablet (4 mg total) by mouth every 6 (six) hours as needed for nausea. 10/17/14  Yes Eugenie Filler, MD  pantoprazole (PROTONIX) 40 MG tablet Take 1 tablet (40 mg total) by mouth daily at 6 (six) AM. 10/17/14  Yes Eugenie Filler, MD  psyllium (HYDROCIL/METAMUCIL) 95 % PACK Take 1 packet by mouth 2 (two) times daily. Patient taking differently: Take 1 packet by mouth 2 (two) times daily. Mix in 6-8 oz of liquid and drink 08/28/14  Yes Robbie Lis, MD  saccharomyces boulardii (FLORASTOR) 250 MG capsule Take 1 capsule (250 mg total) by mouth 2 (two) times daily. 08/03/14  Yes Amy S [name], PA-C  sertraline (ZOLOFT) 100 MG tablet Take 1 tablet (100 mg total) by mouth daily. 10/17/14  Yes Eugenie Filler, MD  oxyCODONE (OXY  IR/ROXICODONE) 5 MG immediate release tablet Take 1 tablet (5 mg total) by mouth every 6 (six) hours. 10/19/14   Indi [place]ite Carlota Raspberry, PA-C   BP 107/57 mmHg  Pulse 87  Temp(Src) 98.3 F (36.8 C)  Resp 18  SpO2 91% Physical Exam  Constitutional: She appears well-developed and well-nourished. She appears distressed.  HENT:  Head: Normocephalic and atraumatic.  Eyes: Pupils are equal, round, and reactive to light.  Neck: Normal range of motion. Neck supple.  Cardiovascular: Normal rate and regular rhythm.   Pulmonary/Chest: Effort normal. No respiratory distress.  Abdominal: Soft. There is no tenderness. There is no guarding.  Genitourinary:     Musculoskeletal:       Left knee: She exhibits swelling and effusion. She exhibits no ecchymosis, no deformity, no laceration and no erythema. Tenderness found.       Legs: Neurological: She is alert.  Skin: Skin is warm and dry.  Nursing note and vitals reviewed.   ED Course  Procedures (including critical care time) Labs Review Labs Reviewed  CBC WITH DIFFERENTIAL/PLATELET - Abnormal; Notable for the following:    WBC 11.3 (*)    RBC 3.13 (*)    Hemoglobin 9.1 (*)     HCT 28.3 (*)    RDW 16.2 (*)    Neutro Abs 7.8 (*)    All other components within normal limits  BASIC METABOLIC PANEL - Abnormal; Notable for the following:    Sodium 129 (*)    Chloride 92 (*)    Calcium 8.3 (*)    All other components within normal limits  I-STAT CG4 LACTIC ACID, ED  I-STAT CG4 LACTIC ACID, ED    Imaging Review Dg Knee Complete 4 Views Left  10/19/2014   CLINICAL DATA:  Anterior knee pain in the patella area.  EXAM: LEFT KNEE - COMPLETE 4+ VIEW  COMPARISON:  None.  FINDINGS: Total knee arthroplasty. Negative for fracture or dislocation. Probable small amount of suprapatellar joint fluid.  IMPRESSION: No acute bone abnormality in the left knee arthroplasty. Question a small amount of joint fluid.   Electronically Signed   By: Markus Daft M.D.   On: 10/19/2014 17:49   I have personally reviewed and evaluated these images and lab results as part of my medical decision-making.   EKG Interpretation None       MDM   Final diagnoses:  Rectal fistula  DVT (deep venous thrombosis), bilateral    I discussed the case with Dr. Billy Fischer who has seen the patient as well. Will run laboratory work and get a xray of the knee   She has no change to WBC, negative lactic acid. And BMP is at baseline. Her xray shows a small effusion but no osteomyelitis. The daughter reports recent MRI of knee just two days ago which was normal. Due to inability to anticoagulate and recurrent DVT pain management is the treatment. No signs of infection or septic joint. She has an IVC filter in place. Her rectal fistula is causing her significant pain and she needs better pain management. I altered her medications from Oxycodone 5mg  PRN 4 hours for pain to Oxycodone 6 mg every 6 hours #20, pt to reschedule remicade with Dr. Deatra Ina. -- Dr. Billy Fischer aware of plan and agreeable.  Medications  0.9 %  sodium chloride infusion ( Intravenous New Bag/Given 10/19/14 1603)  oxyCODONE-acetaminophen  (PERCOCET/ROXICET) 5-325 MG per tablet 1 tablet (not administered)  fentaNYL (SUBLIMAZE) injection 100 mcg (100 mcg Intravenous Given 10/19/14 1603)  ondansetron (  ZOFRAN) injection 4 mg (4 mg Intravenous Given 10/19/14 1603)  fentaNYL (SUBLIMAZE) injection 50 mcg (50 mcg Intravenous Given 10/19/14 1756)    74 y.o.Bri Wakeman Donate's evaluation in the Emergency Department is complete. It has been determined that no acute conditions requiring further emergency intervention are present at this time. The patient/guardian have been advised of the diagnosis and plan. We have discussed signs and symptoms that warrant return to the ED, such as changes or worsening in symptoms.  Vital signs are stable at discharge. Filed Vitals:   10/19/14 1758  BP: 107/57  Pulse: 87  Temp:   Resp: 18    Patient/guardian has voiced understanding and agreed to follow-up with the PCP or specialist.    Delos Haring, PA-C 10/19/14 1845  Gareth Morgan, MD 10/20/14 1332

## 2014-10-19 NOTE — ED Notes (Signed)
Pt was recently admitted to [facility] for perirectal abscess due to crohns, pt received 1 and 2 infusions of remicaid in hospital. 2 days ago pt discharged to [facility], pt came to short stay today for 3rd infusion of remicaid. Pt reports her left knee is warm, swollen, and more painful than normal. Dr Hilarie Fredrickson of short stay said hold remicaid and send pt to ED for knee eval. If pt is discharged to go home with PTAR to [facility].

## 2014-10-19 NOTE — Progress Notes (Signed)
CSW met with patient at bedside. Daughter was present. Patient confirms that she presents to [facility] due to L leg swelling. Also, patient confirms that she comes from [facility]. Daughter states that the patient has been at the facility  "off and on" since July. Daughter states that the patient is receiving wound care and physical therapy at the facility.   Patient states that she receives assistance with completing ADL's. Patient denies falling often. Daughter states that the patient has fallen x1 in the past 6 months. Also, daughter informed CSW that the patient has a history of depression and anxiety.   Daughter states that she is the patient's primary support. Daughter states that she and patient husband live with patient in [place].  Patient and daughter state that they do not have any questions at this time.  Daughter/ Ramona [phone] [address] Omelia [place] [phone] ED CSW 10/19/2014 5:58 PM

## 2014-10-19 NOTE — ED Notes (Signed)
Attempted to get urine upon insertion of foley. There was not enough output to send to lab. Will attempt when enough urine has drained.

## 2014-10-19 NOTE — Discharge Instructions (Signed)
Anal Fistula An anal fistula is an abnormal tunnel that develops between the bowel and skin near the outside of the anus, where feces comes out. The anus has a number of tiny glands that make lubricating fluid. Sometimes these glands can become plugged and infected. This may lead to the development of a fluid-filled pocket (abscess). An anal fistula often develops after this infection or abscess. It is nearly always caused by a past or current anal abscess.  CAUSES  Though an anal fistula is almost always caused by a past or current anal abscess, other causes can include:  A complication of surgery.  Trauma to the rectal area.  Radiation to the area.  Other medical conditions or diseases, such as:   Chronic inflammatory bowel disease, such as Crohn disease or ulcerative colitis.   Colon or rectal cancer.   Diverticular disease, such as diverticulitis.   A sexually transmitted disease, such as gonorrhea, chlamydia, or syphilis.  An HIV infection or AIDS.  SYMPTOMS   Throbbing or constant pain that may be worse when sitting.   Swelling or irritation around the anus.   Drainage of pus or blood from an opening near the anus.   Pain with bowel movements.  Fever or chills. DIAGNOSIS  Your caregiver will examine the area to find the openings of the anal fistula and the fistula tract. The external opening of the anal fistula may be seen during a physical examination. Other examinations that may be performed include:   Examination of the rectal area with a gloved hand (digital rectal exam).   Examination with a probe or scope to help locate the internal opening of the fistula.   Injection of a dye into the fistula opening. X-rays can be taken to find the exact location and path of the fistula.   An MRI or ultrasound of the anal area.  Other tests may be performed to find the cause of the anal fistula.   TREATMENT  The most common treatment for an anal fistula is  surgery. There are different surgery options depending on where your fistula is located and how complex the fistula is. Surgical options include:  A fistulotomy. This surgery involves opening up the whole fistula and draining the contents inside to promote healing.  Seton placement. A silk string (seton) is placed into the fistula during a fistulotomy to drain any infection to promote healing.  Advancement flap procedure. Tissue is removed from your rectum or the skin around the anus and is attached to the opening of the fistula.  Bioprosthetic plug. A cone-shaped plug is made from your tissue and is used to block the opening of the fistula. Some anal fistulas do not require surgery. A fibrin glue is a non-surgical option that involves injecting the glue to seal the fistula. You also may be prescribed an antibiotic medicine to treat an infection.  HOME CARE INSTRUCTIONS   Take your antibiotics as directed. Finish them even if you start to feel better.  Only take over-the-counter or prescription medicines as directed by your caregiver.Use a stool softener or laxative, if recommended.   Eat a high-fiber diet to help avoid constipation or as directed by your caregiver.  Drink enough water to keep your urine clear or pale yellow.   A warm sitz bath may be soothing and help with healing. You may take warm sitz baths for 15-20 minutes, 3-4 times a day to ease pain and discomfort.   Follow excellent hygiene to keep the anal area  as clean and dry as possible. Use wet toilet paper or moist towelettes after each bowel movement.  SEEK MEDICAL CARE IF: You have increased pain not controlled with medicines.  SEEK IMMEDIATE MEDICAL CARE IF:  You have severe, intolerable pain.  You have new swelling, redness, or discharge around the anal area.  You have tenderness or warmth around the anal area.  You have chills or diarrhea.  You have severe problems urinating or having a bowel movement.    You have a fever or persistent symptoms for more than 2-3 days.   You have a fever and your symptoms suddenly get worse.  MAKE SURE YOU:   Understand these instructions.  Will watch your condition.  Will get help right away if you are not doing well or get worse. Document Released: 01/17/2008 Document Revised: 01/21/2012 Document Reviewed: 12/09/2010 [facility] Patient Information 2015 [place], [place]. This information is not intended to replace advice given to you by your health care provider. Make sure you discuss any questions you have with your health care provider.  Deep Vein Thrombosis A deep vein thrombosis (DVT) is a blood clot that develops in the deep, larger veins of the leg, arm, or pelvis. These are more dangerous than clots that might form in veins near the surface of the body. A DVT can lead to serious and even life-threatening complications if the clot breaks off and travels in the bloodstream to the lungs.  A DVT can damage the valves in your leg veins so that instead of flowing upward, the blood pools in the lower leg. This is called post-thrombotic syndrome, and it can result in pain, swelling, discoloration, and sores on the leg. CAUSES Usually, several things contribute to the formation of blood clots. Contributing factors include:  The flow of blood slows down.  The inside of the vein is damaged in some way.  You have a condition that makes blood clot more easily. RISK FACTORS Some people are more likely than others to develop blood clots. Risk factors include:   Smoking.  Being overweight (obese).  Sitting or lying still for a long time. This includes long-distance travel, paralysis, or recovery from an illness or surgery. Other factors that increase risk are:   Older age, especially over 86 years of age.  Having a family history of blood clots or if you have already had a blot clot.  Having major or lengthy surgery. This is especially true for  surgery on the hip, knee, or belly (abdomen). Hip surgery is particularly high risk.  Having a long, thin tube (catheter) placed inside a vein during a medical procedure.  Breaking a hip or leg.  Having cancer or cancer treatment.  Pregnancy and childbirth.  Hormone changes make the blood clot more easily during pregnancy.  The fetus puts pressure on the veins of the pelvis.  There is a risk of injury to veins during delivery or a caesarean delivery. The risk is highest just after childbirth.  Medicines containing the female hormone estrogen. This includes birth control pills and hormone replacement therapy.  Other circulation or heart problems.  SIGNS AND SYMPTOMS When a clot forms, it can either partially or totally block the blood flow in that vein. Symptoms of a DVT can include:  Swelling of the leg or arm, especially if one side is much worse.  Warmth and redness of the leg or arm, especially if one side is much worse.  Pain in an arm or leg. If the clot is  in the leg, symptoms may be more noticeable or worse when standing or walking. The symptoms of a DVT that has traveled to the lungs (pulmonary embolism, PE) usually start suddenly and include:  Shortness of breath.  Coughing.  Coughing up blood or blood-tinged mucus.  Chest pain. The chest pain is often worse with deep breaths.  Rapid heartbeat. Anyone with these symptoms should get emergency medical treatment right away. Do not wait to see if the symptoms will go away. Call your local emergency services (911 in the [place] if you have these symptoms. Do not drive yourself to the hospital. DIAGNOSIS If a DVT is suspected, your health care provider will take a full medical history and perform a physical exam. Tests that also may be required include:  Blood tests, including studies of the clotting properties of the blood.  Ultrasound to see if you have clots in your legs or lungs.  X-rays to show the flow of blood  when dye is injected into the veins (venogram).  Studies of your lungs if you have any chest symptoms. PREVENTION  Exercise the legs regularly. Take a brisk 30-minute walk every day.  Maintain a weight that is appropriate for your height.  Avoid sitting or lying in bed for long periods of time without moving your legs.  Women, particularly those over the age of 42 years, should consider the risks and benefits of taking estrogen medicines, including birth control pills.  Do not smoke, especially if you take estrogen medicines.  Long-distance travel can increase your risk of DVT. You should exercise your legs by walking or pumping the muscles every hour.  Many of the risk factors above relate to situations that exist with hospitalization, either for illness, injury, or elective surgery. Prevention may include medical and nonmedical measures.  Your health care provider will assess you for the need for venous thromboembolism prevention when you are admitted to the hospital. If you are having surgery, your surgeon will assess you the day of or day after surgery. TREATMENT Once identified, a DVT can be treated. It can also be prevented in some circumstances. Once you have had a DVT, you may be at increased risk for a DVT in the future. The most common treatment for DVT is blood-thinning (anticoagulant) medicine, which reduces the blood's tendency to clot. Anticoagulants can stop new blood clots from forming and stop old clots from growing. They cannot dissolve existing clots. Your body does this by itself over time. Anticoagulants can be given by mouth, through an IV tube, or by injection. Your health care provider will determine the best program for you. Other medicines or treatments that may be used are:  Heparin or related medicines (low molecular weight heparin) are often the first treatment for a blood clot. They act quickly. However, they cannot be taken orally and must be given either in shot  form or by IV tube.  Heparin can cause a fall in a component of blood that stops bleeding and forms blood clots (platelets). You will be monitored with blood tests to be sure this does not occur.  Warfarin is an anticoagulant that can be swallowed. It takes a few days to start working, so usually heparin or related medicines are used in combination. Once warfarin is working, heparin is usually stopped.  Factor Xa inhibitor medicines, such as rivaroxaban and apixaban, also reduce blood clotting. These medicines are taken orally and can often be used without heparin or related medicines.  Less commonly, clot dissolving  drugs (thrombolytics) are used to dissolve a DVT. They carry a high risk of bleeding, so they are used mainly in severe cases where your life or a part of your body is threatened.  Very rarely, a blood clot in the leg needs to be removed surgically.  If you are unable to take anticoagulants, your health care provider may arrange for you to have a filter placed in a main vein in your abdomen. This filter prevents clots from traveling to your lungs. HOME CARE INSTRUCTIONS  Take all medicines as directed by your health care provider.  Learn as much as you can about DVT.  Wear a medical alert bracelet or carry a medical alert card.  Ask your health care provider how soon you can go back to normal activities. It is important to stay active to prevent blood clots. If you are on anticoagulant medicine, avoid contact sports.  It is very important to exercise. This is especially important while traveling, sitting, or standing for long periods of time. Exercise your legs by walking or by tightening and relaxing your leg muscles regularly. Take frequent walks.  You may need to wear compression stockings. These are tight elastic stockings that apply pressure to the lower legs. This pressure can help keep the blood in the legs from clotting. Taking Warfarin Warfarin is a daily medicine  that is taken by mouth. Your health care provider will advise you on the length of treatment (usually 3-6 months, sometimes lifelong). If you take warfarin:  Understand how to take warfarin and foods that can affect how warfarin works in Veterinary surgeon.  Too much and too little warfarin are both dangerous. Too much warfarin increases the risk of bleeding. Too little warfarin continues to allow the risk for blood clots. Warfarin and Regular Blood Testing While taking warfarin, you will need to have regular blood tests to measure your blood clotting time. These blood tests usually include both the prothrombin time (PT) and international normalized ratio (INR) tests. The PT and INR results allow your health care provider to adjust your dose of warfarin. It is very important that you have your PT and INR tested as often as directed by your health care provider.  Warfarin and Your Diet Avoid major changes in your diet, or notify your health care provider before changing your diet. Arrange a visit with a registered dietitian to answer your questions. Many foods, especially foods high in vitamin K, can interfere with warfarin and affect the PT and INR results. You should eat a consistent amount of foods high in vitamin K. Foods high in vitamin K include:   Spinach, kale, broccoli, cabbage, collard and turnip greens, Brussels sprouts, peas, cauliflower, seaweed, and parsley.  Beef and pork liver.  Green tea.  Soybean oil. Warfarin with Other Medicines Many medicines can interfere with warfarin and affect the PT and INR results. You must:  Tell your health care provider about any and all medicines, vitamins, and supplements you take, including aspirin and other over-the-counter anti-inflammatory medicines. Be especially cautious with aspirin and anti-inflammatory medicines. Ask your health care provider before taking these.  Do not take or discontinue any prescribed or over-the-counter medicine except on  the advice of your health care provider or pharmacist. Warfarin Side Effects Warfarin can have side effects, such as easy bruising and difficulty stopping bleeding. Ask your health care provider or pharmacist about other side effects of warfarin. You will need to:  Hold pressure over cuts for longer than usual.  Notify your dentist and other health care providers that you are taking warfarin before you undergo any procedures where bleeding may occur. Warfarin with Alcohol and Tobacco   Drinking alcohol frequently can increase the effect of warfarin, leading to excess bleeding. It is best to avoid alcoholic drinks or to consume only very small amounts while taking warfarin. Notify your health care provider if you change your alcohol intake.   Do not use any tobacco products including cigarettes, chewing tobacco, or electronic cigarettes. If you smoke, quit. Ask your health care provider for help with quitting smoking. Alternative Medicines to Warfarin: Factor Xa Inhibitor Medicines  These blood-thinning medicines are taken by mouth, usually for several weeks or longer. It is important to take the medicine every single day at the same time each day.  There are no regular blood tests required when using these medicines.  There are fewer food and drug interactions than with warfarin.  The side effects of this class of medicine are similar to those of warfarin, including excessive bruising or bleeding. Ask your health care provider or pharmacist about other potential side effects. SEEK MEDICAL CARE IF:  You notice a rapid heartbeat.  You feel weaker or more tired than usual.  You feel faint.  You notice increased bruising.  You feel your symptoms are not getting better in the time expected.  You believe you are having side effects of medicine. SEEK IMMEDIATE MEDICAL CARE IF:  You have chest pain.  You have trouble breathing.  You have new or increased swelling or pain in one  leg.  You cough up blood.  You notice blood in vomit, in a bowel movement, or in urine. MAKE SURE YOU:  Understand these instructions.  Will watch your condition.  Will get help right away if you are not doing well or get worse. Document Released: 02/03/2005 Document Revised: 06/20/2013 Document Reviewed: 10/11/2012 [facility] Patient Information 2015 [place], [place]. This information is not intended to replace advice given to you by your health care provider. Make sure you discuss any questions you have with your health care provider.

## 2014-10-19 NOTE — Progress Notes (Signed)
Attempted PIV X1 without success.   Then was informed the ordered infusion has just been cancelled.   Pt going to ED to have her knee evaluated.  No further sticks were attempted.

## 2014-10-19 NOTE — ED Notes (Signed)
Bed: XH74 Expected date:  Expected time:  Means of arrival:  Comments: Short stay

## 2014-10-19 NOTE — ED Notes (Signed)
Pt at short stay for her third infusion of Remicade today, from [facility]. Upon arrival complaining of LLE pain, warmth, and more swollen than right lower extremity. Per nurse states she may have had a knee replacement in that knee.  Pyrtle MD recommended she come here for evaluation. Did not receive Remicade, and will not get it after her ED visit today. Foley from 10/01/14, dressing from abscess on sacrum.

## 2014-10-19 NOTE — ED Notes (Signed)
Nurse drawing labs. 

## 2014-10-19 NOTE — ED Notes (Signed)
Per PA cancel Istat lactic acid

## 2014-10-19 NOTE — Progress Notes (Signed)
Call to Beth X 2 at Dr Kelby Fam office to make him aware of patient's status. Complaining of left knee worse since dc from hospital to SNF 2 days ago. Knee more swollen than right;  Reddened and very warm to touch, Concerned about giving remicade with this condition occuring

## 2014-10-19 NOTE — Progress Notes (Addendum)
Patient arrives today from [facility] for her week # 6 infusion of remicade for Crohn's disease. Patient and daughter reports that LLE is more swollen , pain, and warmer to touch. Called Dr Kelby Fam office to report this prior to remicade infusion. Dr Deatra Ina is out this week. Verbal order from Dr Hilarie Fredrickson to take patient to the ER for evaluation and not to give remicade today.  Report called to charge RN, Faith. To bring patient to room 25 in [facility] emergency room.  Called Roselie Awkward LPN at [facility] to notify them of above.

## 2014-10-19 NOTE — ED Notes (Signed)
[facility] called for transport back to facility 

## 2014-10-20 ENCOUNTER — Telehealth: Payer: Self-pay | Admitting: Gastroenterology

## 2014-10-21 ENCOUNTER — Emergency Department (HOSPITAL_COMMUNITY)
Admission: EM | Admit: 2014-10-21 | Discharge: 2014-10-21 | Disposition: A | Discharge status: Home / Self Care | Payer: Commercial Managed Care - HMO | Attending: Emergency Medicine | Admitting: Emergency Medicine

## 2014-10-21 ENCOUNTER — Encounter (HOSPITAL_COMMUNITY): Payer: Self-pay | Admitting: Emergency Medicine

## 2014-10-21 DIAGNOSIS — E119 Type 2 diabetes mellitus without complications: Secondary | ICD-10-CM | POA: Diagnosis not present

## 2014-10-21 DIAGNOSIS — Y9389 Activity, other specified: Secondary | ICD-10-CM | POA: Insufficient documentation

## 2014-10-21 DIAGNOSIS — S3669XA Other injury of rectum, initial encounter: Secondary | ICD-10-CM | POA: Insufficient documentation

## 2014-10-21 DIAGNOSIS — T148XXA Other injury of unspecified body region, initial encounter: Secondary | ICD-10-CM

## 2014-10-21 DIAGNOSIS — I10 Essential (primary) hypertension: Secondary | ICD-10-CM | POA: Insufficient documentation

## 2014-10-21 DIAGNOSIS — Y9289 Other specified places as the place of occurrence of the external cause: Secondary | ICD-10-CM | POA: Insufficient documentation

## 2014-10-21 DIAGNOSIS — F329 Major depressive disorder, single episode, unspecified: Secondary | ICD-10-CM | POA: Insufficient documentation

## 2014-10-21 DIAGNOSIS — G8929 Other chronic pain: Secondary | ICD-10-CM | POA: Diagnosis not present

## 2014-10-21 DIAGNOSIS — M1990 Unspecified osteoarthritis, unspecified site: Secondary | ICD-10-CM | POA: Diagnosis not present

## 2014-10-21 DIAGNOSIS — K625 Hemorrhage of anus and rectum: Secondary | ICD-10-CM | POA: Diagnosis present

## 2014-10-21 DIAGNOSIS — Z86718 Personal history of other venous thrombosis and embolism: Secondary | ICD-10-CM | POA: Insufficient documentation

## 2014-10-21 DIAGNOSIS — E785 Hyperlipidemia, unspecified: Secondary | ICD-10-CM | POA: Diagnosis not present

## 2014-10-21 DIAGNOSIS — Z86010 Personal history of colonic polyps: Secondary | ICD-10-CM | POA: Insufficient documentation

## 2014-10-21 DIAGNOSIS — K219 Gastro-esophageal reflux disease without esophagitis: Secondary | ICD-10-CM | POA: Diagnosis not present

## 2014-10-21 DIAGNOSIS — Y998 Other external cause status: Secondary | ICD-10-CM | POA: Insufficient documentation

## 2014-10-21 DIAGNOSIS — X58XXXA Exposure to other specified factors, initial encounter: Secondary | ICD-10-CM | POA: Diagnosis not present

## 2014-10-21 DIAGNOSIS — I252 Old myocardial infarction: Secondary | ICD-10-CM | POA: Insufficient documentation

## 2014-10-21 DIAGNOSIS — Z79899 Other long term (current) drug therapy: Secondary | ICD-10-CM | POA: Insufficient documentation

## 2014-10-21 DIAGNOSIS — Z87891 Personal history of nicotine dependence: Secondary | ICD-10-CM | POA: Diagnosis not present

## 2014-10-21 DIAGNOSIS — Z951 Presence of aortocoronary bypass graft: Secondary | ICD-10-CM | POA: Diagnosis not present

## 2014-10-21 DIAGNOSIS — I2510 Atherosclerotic heart disease of native coronary artery without angina pectoris: Secondary | ICD-10-CM | POA: Diagnosis not present

## 2014-10-21 LAB — CBC WITH DIFFERENTIAL/PLATELET
BASOS PCT: 0 % (ref 0–1)
Basophils Absolute: 0 10*3/uL (ref 0.0–0.1)
EOS PCT: 1 % (ref 0–5)
Eosinophils Absolute: 0.1 10*3/uL (ref 0.0–0.7)
HEMATOCRIT: 29.1 % — AB (ref 36.0–46.0)
Hemoglobin: 9.3 g/dL — ABNORMAL LOW (ref 12.0–15.0)
Lymphocytes Relative: 21 % (ref 12–46)
Lymphs Abs: 1.9 10*3/uL (ref 0.7–4.0)
MCH: 28.4 pg (ref 26.0–34.0)
MCHC: 32 g/dL (ref 30.0–36.0)
MCV: 89 fL (ref 78.0–100.0)
MONO ABS: 0.6 10*3/uL (ref 0.1–1.0)
MONOS PCT: 6 % (ref 3–12)
NEUTROS ABS: 6.6 10*3/uL (ref 1.7–7.7)
Neutrophils Relative %: 72 % (ref 43–77)
PLATELETS: 168 10*3/uL (ref 150–400)
RBC: 3.27 MIL/uL — ABNORMAL LOW (ref 3.87–5.11)
RDW: 16.1 % — AB (ref 11.5–15.5)
WBC: 9.2 10*3/uL (ref 4.0–10.5)

## 2014-10-21 LAB — BASIC METABOLIC PANEL
Anion gap: 7 (ref 5–15)
BUN: 6 mg/dL (ref 6–20)
CALCIUM: 8.2 mg/dL — AB (ref 8.9–10.3)
CO2: 29 mmol/L (ref 22–32)
CREATININE: 0.46 mg/dL (ref 0.44–1.00)
Chloride: 95 mmol/L — ABNORMAL LOW (ref 101–111)
GFR calc Af Amer: >60 mL/min (ref >60)
GFR calc non Af Amer: >60 mL/min (ref >60)
GLUCOSE: 75 mg/dL (ref 65–99)
Potassium: 4.3 mmol/L (ref 3.5–5.1)
Sodium: 131 mmol/L — ABNORMAL LOW (ref 135–145)

## 2014-10-21 LAB — POC OCCULT BLOOD, ED: Fecal Occult Bld: NEGATIVE

## 2014-10-21 MED ORDER — HYDROMORPHONE HCL 1 MG/ML IJ SOLN
0.5000 mg | Freq: Once | INTRAMUSCULAR | Status: AC
  Administered 2014-10-21: 0.5 mg via INTRAVENOUS
  Filled 2014-10-21: qty 1

## 2014-10-21 NOTE — Discharge Instructions (Signed)
Incision Care °An incision is when a surgeon cuts into your body tissues. After surgery, the incision needs to be cared for properly to prevent infection.  °HOME CARE INSTRUCTIONS  °· Take all medicine as directed by your caregiver. Only take over-the-counter or prescription medicines for pain, discomfort, or fever as directed by your caregiver. °· Do not remove your bandage (dressing) or get your incision wet until your surgeon gives you permission. In the event that your dressing becomes wet, dirty, or starts to smell, change the dressing and call your surgeon for instructions as soon as possible. °· Take showers. Do not take tub baths, swim, or do anything that may soak the wound until it is healed. °· Resume your normal diet and activities as directed or allowed. °· Avoid lifting any weight until you are instructed otherwise. °· Use anti-itch antihistamine medicine as directed by your caregiver. The wound may itch when it is healing. Do not pick or scratch at the wound. °· Follow up with your caregiver for stitch (suture) or staple removal as directed. °· Drink enough fluids to keep your urine clear or pale yellow. °SEEK MEDICAL CARE IF:  °· You have redness, swelling, or increasing pain in the wound that is not controlled with medicine. °· You have drainage, blood, or pus coming from the wound that lasts longer than 1 day. °· You develop muscle aches, chills, or a general ill feeling. °· You notice a bad smell coming from the wound or dressing. °· Your wound edges separate after the sutures, staples, or skin adhesive strips have been removed. °· You develop persistent nausea or vomiting. °SEEK IMMEDIATE MEDICAL CARE IF:  °· You have a fever. °· You develop a rash. °· You develop dizzy episodes or faint while standing. °· You have difficulty breathing. °· You develop any reaction or side effects to medicine given. °MAKE SURE YOU:  °· Understand these instructions. °· Will watch your condition. °· Will get help  right away if you are not doing well or get worse. °Document Released: 08/23/2004 Document Revised: 04/28/2011 Document Reviewed: 03/30/2013 °[employer]® Patient Information ©2015 [employer]. This information is not intended to replace advice given to you by your health care provider. Make sure you discuss any questions you have with your health care provider. ° ° °

## 2014-10-21 NOTE — ED Notes (Addendum)
Pt's daughter requesting to speak with MD regarding discharge.  Pt and daughter stated that she will need to go back to facility via [facility].  Made Dr Colin Rhein aware of daughter's request.

## 2014-10-21 NOTE — ED Notes (Signed)
Pt has open area in right buttock that packed guaze. When removed the guaze bright red blood was noted on them.

## 2014-10-21 NOTE — ED Notes (Signed)
Per EMS pt comes from [facility] for possible rectal bleeding.  Pt had blood in diaper when day shift went in to change patient this am.  Nursing facility staff reported they dont know where the bleeding is coming from.  Staff did inform EMS that she has abscess on one side of her buttocks, as well as pt having PMH GI bleeds. On paperwork from patient discharge paperwork on 10/17/14, states that pt will continue to have minor rectal bleeding with colitis and patient shouldn't be sent to ER unless this chronic minor rectal bleeding significantly worsens.

## 2014-10-21 NOTE — ED Notes (Signed)
[facility] notified of need to transport back to [place].

## 2014-10-21 NOTE — ED Notes (Signed)
Dr. Gentry at bedside. 

## 2014-10-21 NOTE — ED Notes (Signed)
Bed: YH88 Expected date:  Expected time:  Means of arrival:  Comments: EMS- Rectal bleeding

## 2014-10-21 NOTE — ED Notes (Signed)
Waited on hold with [facility] facility to speak with nursing staff that will be receiving patient back but after 12 mins on hold hung up.

## 2014-10-21 NOTE — ED Provider Notes (Signed)
CSN: [id number]     Arrival date & time 10/21/14  1000 History   First MD Initiated Contact with Patient 10/21/14 1012     Chief Complaint  Patient presents with  . Rectal Bleeding     (Consider location/radiation/quality/duration/timing/severity/associated sxs/prior Treatment) Patient is a 74 y.o. female presenting with general illness.  Illness Location:  From rectum or perirectum Quality:  Bright red blood Severity:  Moderate Onset quality:  Unable to specify Duration:  1 day Timing: once. Progression:  Unchanged Chronicity:  New Context:  Perirectal abscess with large Relieved by:  Nothing Associated symptoms: abdominal pain (chronic) and nausea (chronic)   Associated symptoms: no chest pain, no fever, no shortness of breath and no vomiting     Past Medical History  Diagnosis Date  . CAD (coronary artery disease)     unspecified  . Cerebrovascular disease 2010    bil carotid artery disease. left CEA 02/2004. 02/2014 carotid ultrasound: < 40% bil ICA disease.   Marland Kitchen Hyperlipidemia, mixed   . HTN (hypertension)   . Ulcerative colitis 2002  . Colon polyps 2006  . Thyroid disease   . Myocardial infarct 1998  . Diabetes mellitus without complication     Borderline  . GERD (gastroesophageal reflux disease)   . Arthritis   . 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   . Depression 10/12/2014  . DVT (deep venous thrombosis) 08/29/14    left LE, s/p 08/30/14 IVC filter.    Past Surgical History  Procedure Laterality Date  . Cholecystectomy  7.06.2008    with cholangiogram  . 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 Abdominal hysterectomy    . Colonoscopy  multiple  . Sigmoidoscopy  multiple  . Flexible sigmoidoscopy N/A 08/08/2014    Procedure: FLEXIBLE SIGMOIDOSCOPY;  Surgeon: Gatha Mayer, MD;  Location: [place];  Service: Endoscopy;  Laterality: N/A;  . Incision and drainage perirectal abscess N/A 08/22/2014    Procedure: IRRIGATION AND DEBRIDEMENT PERIRECTAL  ABSCESS;  Surgeon: Jackolyn Confer, MD;  Location: [facility];  Service: General;  Laterality: N/A;  . Joint replacement  left knee  . Flexible sigmoidoscopy N/A 09/06/2014    Procedure: FLEXIBLE SIGMOIDOSCOPY;  Surgeon: Lafayette Dragon, MD;  [facility];  Service: Endoscopy;  Laterality: N/A;  . Laparoscopic diverted colostomy N/A 09/13/2014    Procedure: LAPAROSCOPIC DIVERTED ILEOSTOMY;  Surgeon: Stark Klein, MD;  [facility];  Service: General;  Laterality: N/A;  . Rectal exam under anesthesia N/A 09/13/2014    Procedure: RECTAL EXAM UNDER ANESTHESIA  AND DEBRIDEMENT OF PERIANAL WOUND;  Surgeon: Stark Klein, MD;  [facility];  Service: General;  Laterality: N/A;  . Flexible sigmoidoscopy N/A 10/04/2014    Procedure: Otho Darner SIGMOIDOSCOPY;  Surgeon: Inda Castle, MD;  Location: [place];  Service: Endoscopy;  Laterality: N/A;  . Flexible sigmoidoscopy N/A 10/11/2014    Procedure: FLEXIBLE SIGMOIDOSCOPY;  Surgeon: Irene Shipper, MD;  Location: [place];  Service: Endoscopy;  Laterality: N/A;   Family History  Problem Relation Age of Onset  . Colitis Brother     1 bro with Crohn's, another bro with unspecified colitis.   . Stomach cancer Mother   . Stroke Father     died  . Colon cancer Neg Hx    Social History  Substance Use Topics  . Smoking status: Former Smoker -- 0.30 packs/day for 40 years    Types: Cigarettes    Quit date: 02/17/2013  . Smokeless tobacco:  Never Used  . Alcohol Use: No   OB History    No data available     Review of Systems  Constitutional: Negative for fever.  Respiratory: Negative for shortness of breath.   Cardiovascular: Negative for chest pain.  Gastrointestinal: Positive for nausea (chronic) and abdominal pain (chronic). Negative for vomiting.  All other systems reviewed and are negative.     Allergies  Clindamycin/lincomycin and Ivp dye  Home Medications   Prior to Admission medications   Medication Sig Start Date  End Date Taking? Authorizing Provider  acetaminophen (TYLENOL) 500 MG tablet Take 1 tablet (500 mg total) by mouth every 4 (four) hours as needed for mild pain or moderate pain. Patient taking differently: Take 1,000 mg by mouth every 4 (four) hours as needed for mild pain or moderate pain.  10/17/14  Yes Eugenie Filler, MD  ALPRAZolam Duanne Moron) 0.5 MG tablet Take 1 tablet (0.5 mg total) by mouth 2 (two) times daily as needed for anxiety. 10/17/14  Yes Eugenie Filler, MD  CRESTOR 40 MG tablet TAKE 1 TABLET AT BEDTIME 05/17/14  Yes Lelon Perla, MD  feeding supplement, ENSURE ENLIVE, (ENSURE ENLIVE) LIQD Take 237 mLs by mouth 2 (two) times daily between meals. 10/17/14  Yes Eugenie Filler, MD  LIALDA 1.2 G EC tablet Take 4.8 g by mouth daily. 08/01/14  Yes Historical Provider, MD  mirtazapine (REMERON) 15 MG tablet Take 1 tablet (15 mg total) by mouth at bedtime. 10/17/14  Yes Eugenie Filler, MD  ondansetron (ZOFRAN) 4 MG tablet Take 1 tablet (4 mg total) by mouth every 6 (six) hours as needed for nausea. 10/17/14  Yes Eugenie Filler, MD  oxyCODONE (OXY IR/ROXICODONE) 5 MG immediate release tablet Take 1 tablet (5 mg total) by mouth every 6 (six) hours. 10/19/14  Yes Tiffany Carlota Raspberry, PA-C  pantoprazole (PROTONIX) 40 MG tablet Take 1 tablet (40 mg total) by mouth daily at 6 (six) AM. 10/17/14  Yes Eugenie Filler, MD  psyllium (HYDROCIL/METAMUCIL) 95 % PACK Take 1 packet by mouth 2 (two) times daily. Patient taking differently: Take 1 packet by mouth 2 (two) times daily. Mix in 6-8 oz of liquid and drink 08/28/14  Yes Robbie Lis, MD  saccharomyces boulardii (FLORASTOR) 250 MG capsule Take 1 capsule (250 mg total) by mouth 2 (two) times daily. 08/03/14  Yes Amy S [name], PA-C  sertraline (ZOLOFT) 100 MG tablet Take 1 tablet (100 mg total) by mouth daily. 10/17/14  Yes Eugenie Filler, MD  metoprolol tartrate (LOPRESSOR) 25 MG tablet Take 1 tablet by mouth 2 (two) times daily. 08/16/14    Historical Provider, MD   BP 100/60 mmHg  Pulse 99  Temp(Src) 98.6 F (37 C) (Oral)  Resp 16  SpO2 89% Physical Exam  Constitutional: She is oriented to person, place, and time. She appears well-developed and well-nourished.  HENT:  Head: Normocephalic and atraumatic.  Right Ear: External ear normal.  Left Ear: External ear normal.  Eyes: Conjunctivae and EOM are normal. Pupils are equal, round, and reactive to light.  Neck: Normal range of motion. Neck supple.  Cardiovascular: Normal rate, regular rhythm, normal heart sounds and intact distal pulses.   Pulmonary/Chest: Effort normal and breath sounds normal.  Abdominal: Soft. Bowel sounds are normal. There is no tenderness.  Genitourinary:  No blood in stool from rectum or from ostomy.  Large open wound R perirectal without obvious bleeding  Musculoskeletal: Normal range of motion.  Neurological: She  is alert and oriented to person, place, and time.  Skin: Skin is warm and dry.  Vitals reviewed.   ED Course  Procedures (including critical care time) Labs Review Labs Reviewed  CBC WITH DIFFERENTIAL/PLATELET - Abnormal; Notable for the following:    RBC 3.27 (*)    Hemoglobin 9.3 (*)    HCT 29.1 (*)    RDW 16.1 (*)    All other components within normal limits  BASIC METABOLIC PANEL - Abnormal; Notable for the following:    Sodium 131 (*)    Chloride 95 (*)    Calcium 8.2 (*)    All other components within normal limits  POC OCCULT BLOOD, ED    Imaging Review No results found. I have personally reviewed and evaluated these images and lab results as part of my medical decision-making.   EKG Interpretation None      MDM   Final diagnoses:  Open wound    74 y.o. female with pertinent PMH of CAD, recent perirectal abscess, DVT, on home O2 presents with bleeding from rectal area.  Pt does not know if the blood came from her rectum or the large perirectal open wound.  On arrival vitals and physical exam as above.   No bleeding evident at this time.  No blood from rectum or ostomy, and hemoccult negative.  Suspect this was bleeding from the wound that has now stopped.  Wu unremarkable for pt baseline.  No new dyspnea or other symptoms.  DC home in stable condition.    I have reviewed all laboratory and imaging studies if ordered as above  1. Open wound         Debby Freiberg, MD 10/22/14 [phone]

## 2014-10-21 NOTE — ED Notes (Signed)
Attempted blood draw x2, but both are unsuccessful.

## 2014-10-24 ENCOUNTER — Non-Acute Institutional Stay (SKILLED_NURSING_FACILITY): Payer: Commercial Managed Care - HMO | Admitting: Internal Medicine

## 2014-10-24 ENCOUNTER — Telehealth: Payer: Self-pay | Admitting: Gastroenterology

## 2014-10-24 DIAGNOSIS — Z932 Ileostomy status: Secondary | ICD-10-CM | POA: Diagnosis not present

## 2014-10-24 DIAGNOSIS — K611 Rectal abscess: Secondary | ICD-10-CM | POA: Diagnosis not present

## 2014-10-24 DIAGNOSIS — K6389 Other specified diseases of intestine: Secondary | ICD-10-CM

## 2014-10-24 DIAGNOSIS — E43 Unspecified severe protein-calorie malnutrition: Secondary | ICD-10-CM | POA: Diagnosis not present

## 2014-10-24 DIAGNOSIS — D62 Acute posthemorrhagic anemia: Secondary | ICD-10-CM

## 2014-10-24 DIAGNOSIS — R29898 Other symptoms and signs involving the musculoskeletal system: Secondary | ICD-10-CM

## 2014-10-24 DIAGNOSIS — I82403 Acute embolism and thrombosis of unspecified deep veins of lower extremity, bilateral: Secondary | ICD-10-CM

## 2014-10-24 DIAGNOSIS — K529 Noninfective gastroenteritis and colitis, unspecified: Secondary | ICD-10-CM

## 2014-10-24 NOTE — Telephone Encounter (Signed)
This is a duplicated telephone call. Message sent to Dr Deatra Ina to be advised on rescheduling the patient.

## 2014-10-24 NOTE — Patient Outreach (Signed)
[place] [facility]) Care Management  10/24/2014  [name] 05-Jun-1940 [id number]   Notification from Nat Christen, LCSW to close case due to Patient will remain at [facility], [place], for long-term care placement, upon discharge from [facility].  Thanks, Ronnell Freshwater. [place], [place] Assistant Phone: [phone] Fax: [phone]

## 2014-10-24 NOTE — Progress Notes (Signed)
Patient ID: [name], female   DOB: [birth year], 74 y.o.   MRN: [id number]    Facility; [facility] Chief complaint; admission to SNF post admit to [facility] from 8/23 to 8/30. Multiple recent admissions including 6/20 through 7/4, 7/5 through 8/4, 8/12 through 8/20, 8/23 through 8/30  History; this is a patient who is been in this facility 3 or 4 times although I have not managed to previously see her predominantly due to quick rehospitalizations. As I understand things the patient has a long history of colitis although this is not really been according to the patient a major issue until recently. She was admitted in late June for a flare of her ulcerative colitis. She was discharged home only to be readmitted the next day with a perirectal abscess and fasciitis. She underwent an I&D of the abscess on 7/6. During this admission she was discovered to have a DVT and due to bleeding was not a candidate for anticoagulation she therefore had an IVC filter placed on 7/13. Because of ongoing rectal bleeding and continued flare of her ulcerative colitis she underwent a dive burning ileostomy on 7/27. She was readmitted to hospital from 8/12 through 8/20 with GI bleeding, chest pain . She has a history of him coronary artery disease however EKGs and serial enzymes were negative for. She was once again in the hospital on 8/23 through 8/30 with acute ulcerative colitis with rectal bleeding. She had a CT scan of the abdomen that showed pancolitis and a biopsy that showed acute colitis. She is supposed to be receiving Remicade and I think she has had this 2 or 3 times however the last time she went to the outpatient for an infusion she was sent to the ER due to knee pain with an effusion. There are notes from this most recent admission in saying that "patient should not be sent to the ER" for minor bleeding however I note she is already been sent to the ER over this weekend and returned when her hemoglobin was  stable.  Through all of this the patient has had increasing weakness probably due to disuse although I can't exactly discount a steroid-induced myopathy and some of this. The patient really cannot even turn herself over in bed. She states that she really can't eat Lunch she has no appetite. Her last albumen was 2.2 on 8/23  CBC Latest Ref Rng 10/21/2014 10/19/2014 10/17/2014  WBC 4.0 - 10.5 K/uL 9.2 11.3(H) 11.2(H)  Hemoglobin 12.0 - 15.0 g/dL 9.3(L) 9.1(L) 8.8(L)  Hematocrit 36.0 - 46.0 % 29.1(L) 28.3(L) 27.9(L)  Platelets 150 - 400 K/uL 168 292 241   Lab Results  Component Value Date   CREATININE 0.46 10/21/2014   CREATININE 0.53 10/19/2014   CREATININE 0.53 10/17/2014    Past Medical History  Diagnosis Date  . CAD (coronary artery disease)     unspecified  . Cerebrovascular disease 2010    bil carotid artery disease. left CEA 02/2004. 02/2014 carotid ultrasound: < 40% bil ICA disease.   Marland Kitchen Hyperlipidemia, mixed   . HTN (hypertension)   . Ulcerative colitis 2002  . Colon polyps 2006  . Thyroid disease   . Myocardial infarct 1998  . Diabetes mellitus without complication     Borderline  . GERD (gastroesophageal reflux disease)   . Arthritis   . 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   . Depression 10/12/2014  . DVT (deep venous thrombosis) 08/29/14    left LE, s/p 08/30/14  IVC filter.    Past Surgical History  Procedure Laterality Date  . Cholecystectomy  7.06.2008    with cholangiogram  . 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 Abdominal hysterectomy    . Colonoscopy  multiple  . Sigmoidoscopy  multiple  . Flexible sigmoidoscopy N/A 08/08/2014    Procedure: FLEXIBLE SIGMOIDOSCOPY;  Surgeon: Gatha Mayer, MD;  Location: [place];  Service: Endoscopy;  Laterality: N/A;  . Incision and drainage perirectal abscess N/A 08/22/2014    Procedure: IRRIGATION AND DEBRIDEMENT PERIRECTAL ABSCESS;  Surgeon: Jackolyn Confer, MD;  [facility];  Service: General;  Laterality:  N/A;  . Joint replacement  left knee  . Flexible sigmoidoscopy N/A 09/06/2014    Procedure: FLEXIBLE SIGMOIDOSCOPY;  Surgeon: Lafayette Dragon, MD;  [facility];  Service: Endoscopy;  Laterality: N/A;  . Laparoscopic diverted colostomy N/A 09/13/2014    Procedure: LAPAROSCOPIC DIVERTED ILEOSTOMY;  Surgeon: Stark Klein, MD;  [facility];  Service: General;  Laterality: N/A;  . Rectal exam under anesthesia N/A 09/13/2014    Procedure: RECTAL EXAM UNDER ANESTHESIA  AND DEBRIDEMENT OF PERIANAL WOUND;  Surgeon: Stark Klein, MD;  [facility];  Service: General;  Laterality: N/A;  . Flexible sigmoidoscopy N/A 10/04/2014    Procedure: Otho Darner SIGMOIDOSCOPY;  Surgeon: Inda Castle, MD;  Location: [place];  Service: Endoscopy;  Laterality: N/A;  . Flexible sigmoidoscopy N/A 10/11/2014    Procedure: FLEXIBLE SIGMOIDOSCOPY;  Surgeon: Irene Shipper, MD;  Location: [place];  Service: Endoscopy;  Laterality: N/A;    Current Outpatient Prescriptions on File Prior to Visit  Medication Sig Dispense Refill  . acetaminophen (TYLENOL) 500 MG tablet Take 1 tablet (500 mg total) by mouth every 4 (four) hours as needed for mild pain or moderate pain. (Patient taking differently: Take 1,000 mg by mouth every 4 (four) hours as needed for mild pain or moderate pain. ) 30 tablet 0  . ALPRAZolam (XANAX) 0.5 MG tablet Take 1 tablet (0.5 mg total) by mouth 2 (two) times daily as needed for anxiety. 20 tablet 0  . CRESTOR 40 MG tablet TAKE 1 TABLET AT BEDTIME 90 tablet 2  . feeding supplement, ENSURE ENLIVE, (ENSURE ENLIVE) LIQD Take 237 mLs by mouth 2 (two) times daily between meals. 237 mL 12  . LIALDA 1.2 G EC tablet Take 4.8 g by mouth daily.  0  . metoprolol tartrate (LOPRESSOR) 25 MG tablet Take 1 tablet by mouth 2 (two) times daily.  0  . mirtazapine (REMERON) 15 MG tablet Take 1 tablet (15 mg total) by mouth at bedtime. 30 tablet 0  . ondansetron (ZOFRAN) 4 MG tablet Take 1 tablet (4 mg  total) by mouth every 6 (six) hours as needed for nausea. 20 tablet 0  . oxyCODONE (OXY IR/ROXICODONE) 5 MG immediate release tablet Take 1 tablet (5 mg total) by mouth every 6 (six) hours. 20 tablet 0  . pantoprazole (PROTONIX) 40 MG tablet Take 1 tablet (40 mg total) by mouth daily at 6 (six) AM. 30 tablet 0  . psyllium (HYDROCIL/METAMUCIL) 95 % PACK Take 1 packet by mouth 2 (two) times daily. (Patient taking differently: Take 1 packet by mouth 2 (two) times daily. Mix in 6-8 oz of liquid and drink) 56 each 0  . saccharomyces boulardii (FLORASTOR) 250 MG capsule Take 1 capsule (250 mg total) by mouth 2 (two) times daily. 60 capsule 0  . sertraline (ZOLOFT) 100 MG tablet Take 1 tablet (100 mg total) by  mouth daily. 30 tablet 0     Social; her husband is also a patient in this facility. I don't have a good sense of her functional status before this round of hospitalizations that began in late June. Currently the patient is in a very debilitated state. She cannot even roll over independently. There are no advanced directives on the chart  reports that she quit smoking about 20 months ago. Her smoking use included Cigarettes. She has a 12 pack-year smoking history. She has never used smokeless tobacco. She reports that she does not drink alcohol or use illicit drugs.  family history includes Colitis in her brother; Stomach cancer in her mother; Stroke in her father. There is no history of Colon cancer.   Review of systems; Gen; the patient feels generally weak and exhausted HEENT; no visual disturbances no oral pain or sore throat Respiratory; no shortness of breath, no cough, Cardiac; no exertional chest pain, no palpitations GI; she has a complaint of episodic mostly left sided abdominal pain. I'm really unable to characterize this further. She has an ileostomy in place GU; has a Foley catheter in place, I'm not completely sure of the history here this may just be to protect her surgical wound on  the right buttock Musculoskeletal; and planes of bilateral knee pain that no other joint or back pain. Neurologic; complains of bilateral weakness but no numbness or sensory changes. Psychiatric; she denies depressive symptoms Endocrine; denies a history of diabetes Vascular; no claudication history  Physical exam; Gen. the patient is alert conversational able to give her own history. Vitals temperature 96.8, blood pressure 109/68 pulse 99  respirations 18 O2 sat is 95% on room air weight 215 pounds which is actually up from 206 pounds when she was here briefly at the beginning of August HEENT; . She is edentulous.. No oral lesions are seen Neck; thyroid no lymphadenopathy Respiratory; clear air entry bilaterally no crackles no wheezes Cardiac; S1-S2 normal positive S4 no murmurs she appears to be euvolemic Abdomen; ileostomy in the right mid quadrant draining liquid stool. She is diffusely tender especially in the left upper mid and lower quadrants there is no guarding or rebound bowel sounds are active GU; Foley catheter in place all need to research this I suspect it is just for wound protection Musculoskeletal previous left total knee replacement. There is a mild effusion and warmth in the right knee. No other joint inflammation is noted Extremities; mild pitting edema bilaterally to roughly the mid calf. Peripheral pulses are markedly reduced below the femoral Neurologic; she does not have antigravity strength in the hip flexors 3+ out of 5 abduction distally her strength seems more intact reflexes are preserved at the knee jerks both plantar response downgoing Mental status; somewhat depressed affect, some of this understandable given the complexity of the recent medical admissions to hospital. This will need to be monitored. Her family states she does not have a depression history. Skin; surgical wound in the right buttock in the gluteal fold. Roughly 1-1/2 inches in circumference with  three-quarter an inch depth of. The tissue seems healthy. There is no evidence of soft tissue infection  Impression/plan #1 ulcerative colitis; I'll need to make sure that the she has follow-up for her Remicade infusions. I've spoken to her daughter about this currently on mesalamine. 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 Currently this does not seem too bad. She has had a previous left total knee replacement #4 history of a DVT status post IVC filter placement. She is certainly not a candidate for anticoagulation into the foreseeable future. I think she probably has superficial phlebitis as well as recurrent deep thrombosis. Compression stockings are in order. #5 protein calorie malnutrition; I have spoken to the patient and her family about this #6 history of coronary artery disease status post CABG although she does not complain of chest pain she does have an S4. Consider a beta blocker #7 profound proximal lower extremity weakness which is probably all disuse. I note she is on a statin. Could have a component of critical illness neuropathy or myopathy #8 depression??? This will need to be followed carefully on Zoloft and Remeron #9 perirectal abscess status post surgical I&D. She is getting wet-to-dry dressings here. I don't think there is any other better suggestion. This is not in a place where a wound VAC could be considered.        CLINICAL DATA:  Ulcerative colitis and recent rectal abscess. Abdominal pain and bloody stools.   EXAM: CT ABDOMEN AND PELVIS WITHOUT CONTRAST   TECHNIQUE: Multidetector CT imaging of the abdomen and pelvis was performed following the standard protocol without IV contrast.   COMPARISON:  09/29/2014   FINDINGS: BODY WALL: A presumably packed defect in the  right gluteal fold which tracks to the thickened right levator is more distended than before. There is no tracking subcutaneous gas or evidence of undrained fluid collection.   Ostomy in the right lower quadrant for loop ileostomy.   LOWER CHEST: Extensive coronary atherosclerosis, status post CABG. Prominent calcification of the aortic root wall with apparent dilatation at least partly related to obliquity of imaging.   ABDOMEN/PELVIS:   Liver: Subtle undulation the liver surface but no other morphologic changes of cirrhosis.   Biliary: Cholecystectomy. Common bile duct enlargement is chronic and stable.   Pancreas: Stable borderline dilatation of the main pancreatic duct at 3 mm. No active inflammatory changes.   Spleen: Unremarkable.   Adrenals: Unremarkable.   Kidneys and ureters: Hilar calcifications are arterial. Bilateral lobulated renal cortex consistent with scarring on postcontrast imaging comparison. 22 mm left renal cyst. No hydronephrosis.   Bladder: Decompressed by a Foley catheter.   Reproductive: Hysterectomy.  Negative adnexa.  Pelvic floor laxity.   Bowel: Pan colonic thickening even when accounting for underdistention with submucosal low density either from edema and/or fat. Mild haziness of the surrounding fat confirms inflammation. No small bowel obstruction or inflammatory change. No appendicitis.   Retroperitoneum: No mass or adenopathy.   Peritoneum: No ascites or pneumoperitoneum.   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   OSSEOUS: No acute finding. Lower lumbar facet arthropathy with low grade 1 anterolisthesis at L4-5.   IMPRESSION: 1. Pancolitis which could be active ulcerative colitis or infectious. 2. Packed right perianal and ischiorectal fossa abscess. No evidence of undrained fluid collection.

## 2014-10-24 NOTE — Telephone Encounter (Signed)
Please review her last 2 ER visits that occurred within the last 4 days. She has an open wound. She did not get the Remicade on 10/19/14 as was originally planned. Is it okay to reschedule her?

## 2014-10-25 ENCOUNTER — Other Ambulatory Visit: Payer: Self-pay

## 2014-10-25 DIAGNOSIS — K51913 Ulcerative colitis, unspecified with fistula: Secondary | ICD-10-CM

## 2014-10-25 NOTE — Telephone Encounter (Signed)
Yes

## 2014-10-26 NOTE — Telephone Encounter (Signed)
Remicade infusion (the week 6 of induction) scheduled for 11/01/14 arrive at [facility] Stay at 10/30 am. She has an appointment here for follow up on 10/31/14 at 10:00 am. Communicated this with [facility] and left a message for [place]

## 2014-10-30 ENCOUNTER — Encounter: Payer: Self-pay | Admitting: *Deleted

## 2014-10-30 ENCOUNTER — Other Ambulatory Visit: Payer: Self-pay | Admitting: *Deleted

## 2014-10-30 MED ORDER — OXYCODONE HCL 5 MG PO TABS: ORAL_TABLET | ORAL | Status: DC

## 2014-10-30 NOTE — Telephone Encounter (Signed)
[facility] 

## 2014-10-31 ENCOUNTER — Non-Acute Institutional Stay (SKILLED_NURSING_FACILITY): Payer: Commercial Managed Care - HMO | Admitting: Internal Medicine

## 2014-10-31 ENCOUNTER — Ambulatory Visit (INDEPENDENT_AMBULATORY_CARE_PROVIDER_SITE_OTHER): Payer: Commercial Managed Care - HMO | Admitting: Physician Assistant

## 2014-10-31 ENCOUNTER — Encounter: Payer: Self-pay | Admitting: Physician Assistant

## 2014-10-31 ENCOUNTER — Other Ambulatory Visit (INDEPENDENT_AMBULATORY_CARE_PROVIDER_SITE_OTHER): Payer: Commercial Managed Care - HMO

## 2014-10-31 VITALS — BP 107/74

## 2014-10-31 DIAGNOSIS — K611 Rectal abscess: Secondary | ICD-10-CM | POA: Diagnosis not present

## 2014-10-31 DIAGNOSIS — D62 Acute posthemorrhagic anemia: Secondary | ICD-10-CM | POA: Diagnosis not present

## 2014-10-31 DIAGNOSIS — Z932 Ileostomy status: Secondary | ICD-10-CM

## 2014-10-31 DIAGNOSIS — K51913 Ulcerative colitis, unspecified with fistula: Secondary | ICD-10-CM

## 2014-10-31 DIAGNOSIS — D500 Iron deficiency anemia secondary to blood loss (chronic): Secondary | ICD-10-CM

## 2014-10-31 DIAGNOSIS — K6389 Other specified diseases of intestine: Secondary | ICD-10-CM | POA: Diagnosis not present

## 2014-10-31 DIAGNOSIS — K529 Noninfective gastroenteritis and colitis, unspecified: Secondary | ICD-10-CM

## 2014-10-31 LAB — BASIC METABOLIC PANEL
BUN: 8 mg/dL (ref 6–23)
CALCIUM: 8 mg/dL — AB (ref 8.4–10.5)
CO2: 17 mEq/L — ABNORMAL LOW (ref 19–32)
CREATININE: 0.64 mg/dL (ref 0.40–1.20)
Chloride: 104 mEq/L (ref 96–112)
GFR: 115.97 mL/min (ref >60.00)
GLUCOSE: 86 mg/dL (ref 70–99)
Potassium: 5.2 mEq/L — ABNORMAL HIGH (ref 3.5–5.1)
SODIUM: 132 meq/L — AB (ref 135–145)

## 2014-10-31 NOTE — Progress Notes (Signed)
Patient ID: [name], female   DOB: 1940/12/02, 74 y.o.   MRN: [id number]   Subjective:    Patient ID: [name], female    DOB: 16-Feb-1941, 74 y.o.   MRN: [id number]  HPI [name] is a pleasant 74 year old African-American female known to Dr. Deatra Ina who is seen today in post hospital follow-up. She has had a prolonged illness this past summer with development of severe colitis complicated by a perirectal abscess and fistula. She was just discharged from the hospital on 10/17/2014 and is currently residing at [facility] facility for rehabilitation. She had become very debilitated and is currently nonambulatory. Patient had undergone a diverting ileostomy on 09/13/2014 and had had open debridement of her perirectal abscess during hospitalization as well. She is being followed by Dr. Cher Nakai. Patient had been seen by GI in hospital as well and had been started on Remicade. She had 2 infusions during hospitalization and just had her third infusion on September 1 to complete induction. Last sigmoidoscopy was done 10/11/2014 per Dr. Henrene Pastor with finding of moderately severe colitis from the rectum to the descending colon. Biopsies were taking showing chronic moderately active colitis. Patient has continued to have some intermittent bleeding from her rectum and had an ER visit on 10/21/2014 with rectal bleeding which had subsided by the time she was evaluated in the emergency room.No change in therapy was undertaken and her hemoglobin was 9.3 which was stable. She denies any ongoing abdominal pain says she has intermittent lower abdominal discomfort and also continues to have some rectal and perirectal discomfort though nowhere near as severe as a month ago. She says she's having PT on a daily basis at the nursing facility but is not able to stand on her own as yet.  Review of Systems Pertinent positive and negative review of systems were noted in the above HPI section.  All other review of systems  was otherwise negative.  Outpatient Encounter Prescriptions as of 10/31/2014  Medication Sig  . acetaminophen (TYLENOL) 500 MG tablet Take 1 tablet (500 mg total) by mouth every 4 (four) hours as needed for mild pain or moderate pain. (Patient taking differently: Take 1,000 mg by mouth every 4 (four) hours as needed for mild pain or moderate pain. )  . ALPRAZolam (XANAX) 0.5 MG tablet Take 1 tablet (0.5 mg total) by mouth 2 (two) times daily as needed for anxiety.  . CRESTOR 40 MG tablet TAKE 1 TABLET AT BEDTIME  . feeding supplement, ENSURE ENLIVE, (ENSURE ENLIVE) LIQD Take 237 mLs by mouth 2 (two) times daily between meals.  Marland Kitchen LIALDA 1.2 G EC tablet Take 4.8 g by mouth daily.  . metoprolol tartrate (LOPRESSOR) 25 MG tablet Take 1 tablet by mouth 2 (two) times daily.  . mirtazapine (REMERON) 15 MG tablet Take 1 tablet (15 mg total) by mouth at bedtime.  . ondansetron (ZOFRAN) 4 MG tablet Take 1 tablet (4 mg total) by mouth every 6 (six) hours as needed for nausea.  Marland Kitchen oxyCODONE (OXY IR/ROXICODONE) 5 MG immediate release tablet Take one tablet by mouth every six hours for pain  . pantoprazole (PROTONIX) 40 MG tablet Take 1 tablet (40 mg total) by mouth daily at 6 (six) AM.  . psyllium (HYDROCIL/METAMUCIL) 95 % PACK Take 1 packet by mouth 2 (two) times daily. (Patient taking differently: Take 1 packet by mouth 2 (two) times daily. Mix in 6-8 oz of liquid and drink)  . saccharomyces boulardii (FLORASTOR) 250 MG capsule Take 1  capsule (250 mg total) by mouth 2 (two) times daily.  . sertraline (ZOLOFT) 100 MG tablet Take 1 tablet (100 mg total) by mouth daily.  . [DISCONTINUED] oxyCODONE (OXY IR/ROXICODONE) 5 MG immediate release tablet Take 1 tablet (5 mg total) by mouth every 4 (four) hours as needed for moderate pain.   No facility-administered encounter medications on file as of 10/31/2014.   Allergies  Allergen Reactions  . Clindamycin/Lincomycin Diarrhea    Leads to colitis flare  . Ivp Dye  [Iodinated Diagnostic Agents] Other (See Comments)    "almost passed out"   Patient Active Problem List   Diagnosis Date Noted  . Perirectal abscess   . Protein-calorie malnutrition, severe 10/12/2014  . Depression 10/12/2014  . Pancolitis   . UTI (lower urinary tract infection) 10/10/2014  . Pressure ulcer 10/05/2014  . Inflammatory bowel disease   . Malnutrition of moderate degree 09/30/2014  . Rectal bleeding 09/29/2014  . GI bleed 09/29/2014  . Elevated lactic acid level   . Rectal fistula   . Fasciitis   . Cold   . DVT (deep venous thrombosis)   . Acute ulcerative colitis with rectal bleeding   . Abscess, perirectal s/p I&D 08/23/2014 08/27/2014  . Sepsis 08/26/2014  . Infection due to yeast 08/26/2014  . Acute blood loss anemia 08/26/2014  . Leukocytosis 08/26/2014  . Hypokalemia 08/26/2014  . Hyponatremia 08/26/2014  . General weakness 08/26/2014  . Thrombocytopenia 08/26/2014  . Essential hypertension 07/11/2008  . Inflammatory bowel disease (IBD) with colitis 07/11/2008   Social History   Social History  . Marital Status: Married    Spouse Name: N/A  . Number of Children: N/A  . Years of Education: N/A   Occupational History  . retired    Social History Main Topics  . Smoking status: Former Smoker -- 0.30 packs/day for 40 years    Types: Cigarettes    Quit date: 02/17/2013  . Smokeless tobacco: Never Used  . Alcohol Use: No  . Drug Use: No  . Sexual Activity: Not on file   Other Topics Concern  . Not on file   Social History Narrative    Ms. Hume's family history includes Colitis in her brother; Stomach cancer in her mother; Stroke in her father. There is no history of Colon cancer.      Objective:    Filed Vitals:   10/31/14 0958  BP: 107/74    Physical Exam  well-developed chronically ill-appearing elderly African-American female in no acute distress, she is brought in on a stretcher from nursing facility blood pressure 107/74 pulse 80  she was not weighed today. HEENT; nontraumatic normocephalic EOMI PERRLA sclera anicteric, Supple no JVD, Cardiovascular; regular rate and rhythm with S1-S2 no murmur or gallop, Pulmonary; clear bilaterally, Abdomen; soft she is mildly tender in the lower abdomen bilaterally she has an ileostomy in the right lower quadrant with a small amount of liquid stool, bowel sounds are present, perirectal abscess/wound not examined today, Extremities ;no clubbing cyanosis or edema skin warm dry, Neuropsych ;mood and affect appropriate       Assessment & Plan:   #1 74 yo female with severe colitis complicated by fistula and perirectal abscess. She is s/p I/D of abscess 7/6 and diverting ileostomy 09/13/14   has initiated  Remicade and completed induction .  Continues on Lialda 4.8 gm daily  #2 anemia- secondary to blood loss -stable #3 Debilitation #4 depression  Plan; Continue Remicade infusions q 8 weeks- next infusion first  week of November Continue Lialda 4.8 gm daily  Continue wound dressing changes BID  Will arrange office follow up with Dr. Barry Dienes  Follow up Dr. Deatra Ina one month  CBC, BMET today    Dellamae Rosamilia Genia Harold PA-C 10/31/2014   Cc: Seward Carol, MD

## 2014-10-31 NOTE — Patient Instructions (Addendum)
We have made you a follow up appointment with Dr. Erskine Emery on 12-20-2014 at 10:15 am. We will call you with the consultation appointment with Dr. Barry Dienes at [facility].  We will also call with the Remicade infsusion. Information and dates. 1st one will be first week in November. We will call with exact date.   We will fax the office notes from today to [place].  Continue the current medicaitons. Need dressing changes twice daily to perrirectal wound.

## 2014-11-01 ENCOUNTER — Encounter (HOSPITAL_COMMUNITY): Admission: RE | Admit: 2014-11-01 | Payer: Commercial Managed Care - HMO | Source: Ambulatory Visit

## 2014-11-01 ENCOUNTER — Telehealth: Payer: Self-pay | Admitting: Gastroenterology

## 2014-11-01 NOTE — Telephone Encounter (Signed)
Thank you :)

## 2014-11-01 NOTE — Telephone Encounter (Signed)
[facility] did not pick up the patient today for her 3rd Remicade infusion. [facility] does not have an opening until 29th. [facility] will do the 3 rd infusion on 11/06/14. [facility] notified. Orders faxed.

## 2014-11-05 NOTE — Progress Notes (Signed)
Patient ID: [name], female   DOB: 1940-06-12, 74 y.o.   MRN: [id number]                PROGRESS NOTE  DATE:  10/31/2014           FACILITY: [facility]               LEVEL OF CARE:   SNF   Acute Visit                CHIEF COMPLAINT:  Re-review, status post admission last week.    HISTORY OF PRESENT ILLNESS:  This is a patient whom I admitted to the facility for the first time a week ago after several failed attempts to keep her stable here.    She is a patient who has severe inflammatory bowel disease, ulcerative colitis.     She also had a DVT and is status post IVC filter.    To my knowledge, she has not had any significant ongoing bleeding although her hemoglobin yesterday was 8.9, down from 9.3.    The patient states she feels well.  She is not having abdominal pain.     Apparently, she has not started significant physical therapy.  According to the wound care nurse, her surgical I&D on her perirectal abscess looks quite good.    LABORATORY DATA:  Lab work from yesterday:     Her sodium was 131, potassium 4.3, BUN 8, creatinine 0.59.    Her albumin was 1.9.    Total CK was 21.    Past Medical History  Diagnosis Date  . CAD (coronary artery disease)     unspecified  . Cerebrovascular disease 2010    bil carotid artery disease. left CEA 02/2004. 02/2014 carotid ultrasound: < 40% bil ICA disease.   Marland Kitchen Hyperlipidemia, mixed   . HTN (hypertension)   . Ulcerative colitis 2002  . Colon polyps 2006, 2014  . Thyroid disease   . Myocardial infarct 1998  . Diabetes mellitus without complication     Borderline  . GERD (gastroesophageal reflux disease)   . Arthritis   . 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   . Depression 10/12/2014  . DVT (deep venous thrombosis) 08/29/14    left LE, s/p 08/30/14 IVC filter.    Past Surgical History  Procedure Laterality Date  . Cholecystectomy  7.06.2008    with cholangiogram  . 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  Abdominal hysterectomy    . Colonoscopy  multiple  . Sigmoidoscopy  multiple  . Flexible sigmoidoscopy N/A 08/08/2014    Procedure: FLEXIBLE SIGMOIDOSCOPY;  Surgeon: Gatha Mayer, MD;  Location: [facility];  Service: Endoscopy;  Laterality: N/A;  . Incision and drainage perirectal abscess N/A 08/22/2014    Procedure: IRRIGATION AND DEBRIDEMENT PERIRECTAL ABSCESS;  Surgeon: Jackolyn Confer, MD;  Location: [facility];  Service: General;  Laterality: N/A;  . Joint replacement  left knee  . Flexible sigmoidoscopy N/A 09/06/2014    Procedure: FLEXIBLE SIGMOIDOSCOPY;  Surgeon: Lafayette Dragon, MD;  Location: [facility];  Service: Endoscopy;  Laterality: N/A;  . Laparoscopic diverted colostomy N/A 09/13/2014    Procedure: LAPAROSCOPIC DIVERTED ILEOSTOMY;  Surgeon: Stark Klein, MD;  Location: [facility];  Service: General;  Laterality: N/A;  . Rectal exam under anesthesia N/A 09/13/2014    Procedure: RECTAL EXAM UNDER ANESTHESIA  AND DEBRIDEMENT OF PERIANAL WOUND;  Surgeon: Stark Klein, MD;  Location: [facility];  Service: General;  Laterality: N/A;  . Flexible sigmoidoscopy N/A 10/04/2014    Procedure: FLEXIBLE SIGMOIDOSCOPY;  Surgeon: Inda Castle, MD;  Location: [facility];  Service: Endoscopy;  Laterality: N/A;  . Flexible sigmoidoscopy N/A 10/11/2014    Procedure: FLEXIBLE SIGMOIDOSCOPY;  Surgeon: Irene Shipper, MD;  Location: [facility];  Service: Endoscopy;  Laterality: N/A;    CURRENT MEDICATIONS:  Medication list is reviewed.    REVIEW OF SYSTEMS:    HEENT:   She does not have any oral pain or sore throat.   CHEST/RESPIRATORY:  No shortness of breath.     CARDIAC:  No chest pain.   GI:  No abdominal pain.   Her ileostomy is in place.     GU:  Foley catheter in place.    MUSCULOSKELETAL:  Still complains of bilateral knee pain.    PHYSICAL EXAMINATION:   VITAL SIGNS:     TEMPERATURE:  98.     PULSE:  78.     RESPIRATIONS:  18.      BLOOD PRESSURE:  138/80.    CHEST/RESPIRATORY:  Clear air  entry bilaterally.    CARDIOVASCULAR:   CARDIAC:  Heart sounds are normal.  There are no murmurs.    GASTROINTESTINAL:   ABDOMEN:  Soft.  There are no masses.     GENITOURINARY:   BLADDER:  Foley catheter in place.    MUSCULOSKELETAL:   EXTREMITIES:   RIGHT LOWER EXTREMITY:  There is still a mild right knee effusion.      ASSESSMENT/PLAN:                   Anemia.  Hemoglobin is down slightly from 10/21/2014 when it was 9.3, down to 8.9 yesterday.  This will need to be followed.     Lower extremity weakness.   She is going to start apparently with therapy in the department tomorrow.   Surgical wound.  Apparently, they asked them to do the wet-to-dry b.i.d.  I will simply change this to Hydrogel, which should suffice.     Severe protein calorie malnutrition.   I have once again talked to the patient about this.  Her albumin at 1.9 is down from the last time we measured this at 2.2.    Status post ileostomy.  This, again, seems stable.  She seems to be able to take enough liquid in to keep up with her ostomy outputs.  Her total CO2 is 25.  This is within the normal range.

## 2014-11-06 ENCOUNTER — Encounter (HOSPITAL_COMMUNITY)
Admission: RE | Admit: 2014-11-06 | Discharge: 2014-11-06 | Disposition: A | Discharge status: Home / Self Care | Payer: Commercial Managed Care - HMO | Source: Ambulatory Visit | Attending: Gastroenterology | Admitting: Gastroenterology

## 2014-11-06 VITALS — BP 101/50 | HR 63 | Temp 97.9°F | Resp 20 | Ht 69.0 in | Wt 200.0 lb

## 2014-11-06 DIAGNOSIS — K50113 Crohn's disease of large intestine with fistula: Secondary | ICD-10-CM | POA: Diagnosis not present

## 2014-11-06 DIAGNOSIS — K51913 Ulcerative colitis, unspecified with fistula: Secondary | ICD-10-CM

## 2014-11-06 MED ORDER — ACETAMINOPHEN 325 MG PO TABS
ORAL_TABLET | ORAL | Status: AC
  Filled 2014-11-06: qty 2

## 2014-11-06 MED ORDER — DIPHENHYDRAMINE HCL 25 MG PO CAPS
ORAL_CAPSULE | ORAL | Status: AC
  Filled 2014-11-06: qty 2

## 2014-11-06 MED ORDER — SODIUM CHLORIDE 0.9 % IV SOLN
INTRAVENOUS | Status: AC
  Administered 2014-11-06: 11:00:00 via INTRAVENOUS

## 2014-11-06 MED ORDER — ACETAMINOPHEN 325 MG PO TABS
650.0000 mg | ORAL_TABLET | Freq: Once | ORAL | Status: AC
  Administered 2014-11-06: 650 mg via ORAL

## 2014-11-06 MED ORDER — DIPHENHYDRAMINE HCL 25 MG PO CAPS
50.0000 mg | ORAL_CAPSULE | Freq: Once | ORAL | Status: AC
  Administered 2014-11-06: 50 mg via ORAL

## 2014-11-06 MED ORDER — SODIUM CHLORIDE 0.9 % IV SOLN
5.0000 mg/kg | Freq: Once | INTRAVENOUS | Status: AC
  Administered 2014-11-06: 500 mg via INTRAVENOUS
  Filled 2014-11-06: qty 50

## 2014-11-06 NOTE — Progress Notes (Signed)
We need to check IBD serologies.  She carries a diagnosis of left sided UC but perirectal abscess suggests she could have Crohns .  Should she not significantly improve with remicade would consider total colectomy provided that she has UC.

## 2014-11-07 ENCOUNTER — Non-Acute Institutional Stay (SKILLED_NURSING_FACILITY): Payer: Commercial Managed Care - HMO | Admitting: Internal Medicine

## 2014-11-07 DIAGNOSIS — R29898 Other symptoms and signs involving the musculoskeletal system: Secondary | ICD-10-CM | POA: Diagnosis not present

## 2014-11-07 DIAGNOSIS — K6389 Other specified diseases of intestine: Secondary | ICD-10-CM | POA: Diagnosis not present

## 2014-11-07 DIAGNOSIS — Z932 Ileostomy status: Secondary | ICD-10-CM | POA: Diagnosis not present

## 2014-11-07 DIAGNOSIS — K529 Noninfective gastroenteritis and colitis, unspecified: Secondary | ICD-10-CM

## 2014-11-10 ENCOUNTER — Encounter: Payer: Self-pay | Admitting: Internal Medicine

## 2014-11-12 NOTE — Progress Notes (Addendum)
Patient ID: [name], female   DOB: 1940/08/10, 74 y.o.   MRN: [id number]                PROGRESS NOTE  DATE:  11/07/2014         FACILITY: [facility]             LEVEL OF CARE:   SNF   Acute Visit/Routine Visit                      CHIEF COMPLAINT:  Review of surgical wound in her perirectal area, general medical issues.       HISTORY OF PRESENT ILLNESS:   This is a patient who came to the facility two weeks ago after several failed attempts to keep her in the building.    She has severe inflammatory bowel disease, ulcerative colitis, a perirectal abscess that required I&D, a DVT, post IVC filter.    According to staff and family, her intake is still poor but improving.  She has not had any nausea or vomiting.  She has an ileostomy in place with apparently an abrasion around it to which they are applying zinc oxide.  Her albumin on admission was 1.9.    CURRENT MEDICATIONS:  Medication list is reviewed.         Mesalamine 1.2 g, 4 tablets daily.    Zoloft 100 q.d.      Metamucil 1 packet twice daily.     Florastor 250 twice daily.      Crestor 40 q.d.      Ensure 237 mL between meals.    Protonix 40 q.d.      Oxycodone 5 mg q.4 p.r.n.       Xanax 0.5 b.i.d. p.r.n.       Remeron 15 mg at bedtime.    Zofran 4 mg q.6 p.r.n.      REVIEW OF SYSTEMS:  HEENT: She does not have any oral pain or sore throat.  CHEST/RESPIRATORY: No shortness of breath.  CARDIAC: No chest pain.  GI: No abdominal pain. Her ileostomy is in place.States her appetite is improving  GU: Foley catheter in place.  MUSCULOSKELETAL: Still complains of bilateral knee pain. No low back pain Neurologic: complains of generalized weakness  Mental status: no complaints of depression     PHYSICAL EXAMINATION:   VITAL SIGNS:     TEMPERATURE:  98.3.      PULSE:  64.      RESPIRATIONS:  17.     BLOOD PRESSURE:  137/61.     02 SATURATIONS:   WEIGHT:  112 pounds,  reasonably stable.     CHEST/RESPIRATORY:  Clear air entry bilaterally.    CARDIOVASCULAR:   CARDIAC:  Heart sounds are normal.  There are no murmurs.    GASTROINTESTINAL:   ABDOMEN:  Not distended.  Bowel sounds are positive.  She has some tenderness to deep palpation in the left lower quadrant.  There is no guarding or rebound.  Without palpation, she is not complaining of any pain.    CIRCULATION:   EDEMA/VARICOSITIES:  Extremities:  Some edema, 2+ to the mid calf, but no evidence of a DVT.    ASSESSMENT/PLAN:                    Ulcerative colitis with all of its complications.  She is following with GI for Remicade injections.    Surgical wound with a perirectal abscess.  This appears  clean.  There is an odor, but no evidence of infection.  We will continue the Hydrogel wet-to-dry and we will order something for odor control.    Anemia.  I will follow up on the hemoglobin next week.    Overall, she looks better.  Intake is still poor.  I will lift any dietary restrictions here.

## 2014-11-14 NOTE — Progress Notes (Signed)
When entering orders for upcoming Remicade infusion in two days patient's weight indicated she would need 254 mg of Remicade. Spoke to [place] in Pharmacy and she stated the doses available are 200 and 300mg . In addition per progress notes patient has been seen for a surgical would in her perirectal area. Call placed and message left for Beth RN at Dr. Kelby Fam office regarding both issues above (is patient allowed to receive the med and if so which dose would he prefer).

## 2014-11-15 ENCOUNTER — Telehealth: Payer: Self-pay

## 2014-11-15 NOTE — Telephone Encounter (Signed)
The Remicade infusion is rescheduled for 01/01/15. But this is what Dr Deatra Ina said about the dosing. Her weight will likely be different by the time she comes back.

## 2014-11-15 NOTE — Telephone Encounter (Signed)
Per RN, the patient's weight indicated she would need 254 mg of Remicade. She spoke to [place] in Pharmacy and she stated the doses available are 200 and 300mg . [facility] needs to confirm which way you want the dosing to go.

## 2014-11-15 NOTE — Telephone Encounter (Signed)
300mg  dose

## 2014-11-16 ENCOUNTER — Encounter (HOSPITAL_COMMUNITY)
Admission: RE | Admit: 2014-11-16 | Discharge: 2014-11-16 | Disposition: A | Discharge status: Home / Self Care | Payer: Commercial Managed Care - HMO | Source: Ambulatory Visit | Attending: Gastroenterology | Admitting: Gastroenterology

## 2014-11-21 ENCOUNTER — Emergency Department (HOSPITAL_COMMUNITY)
Admission: EM | Admit: 2014-11-21 | Discharge: 2014-11-22 | Disposition: A | Discharge status: Other Acute Inpatient Hospital | Payer: Commercial Managed Care - HMO | Attending: Emergency Medicine | Admitting: Emergency Medicine

## 2014-11-21 ENCOUNTER — Encounter (HOSPITAL_COMMUNITY): Payer: Self-pay | Admitting: *Deleted

## 2014-11-21 ENCOUNTER — Non-Acute Institutional Stay (SKILLED_NURSING_FACILITY): Payer: Commercial Managed Care - HMO | Admitting: Internal Medicine

## 2014-11-21 ENCOUNTER — Other Ambulatory Visit: Payer: Self-pay | Admitting: *Deleted

## 2014-11-21 DIAGNOSIS — F329 Major depressive disorder, single episode, unspecified: Secondary | ICD-10-CM | POA: Insufficient documentation

## 2014-11-21 DIAGNOSIS — Z87891 Personal history of nicotine dependence: Secondary | ICD-10-CM | POA: Insufficient documentation

## 2014-11-21 DIAGNOSIS — Z951 Presence of aortocoronary bypass graft: Secondary | ICD-10-CM | POA: Diagnosis not present

## 2014-11-21 DIAGNOSIS — M1990 Unspecified osteoarthritis, unspecified site: Secondary | ICD-10-CM | POA: Insufficient documentation

## 2014-11-21 DIAGNOSIS — I82503 Chronic embolism and thrombosis of unspecified deep veins of lower extremity, bilateral: Secondary | ICD-10-CM | POA: Diagnosis not present

## 2014-11-21 DIAGNOSIS — M79602 Pain in left arm: Secondary | ICD-10-CM

## 2014-11-21 DIAGNOSIS — I252 Old myocardial infarction: Secondary | ICD-10-CM | POA: Insufficient documentation

## 2014-11-21 DIAGNOSIS — Z79899 Other long term (current) drug therapy: Secondary | ICD-10-CM | POA: Insufficient documentation

## 2014-11-21 DIAGNOSIS — E119 Type 2 diabetes mellitus without complications: Secondary | ICD-10-CM | POA: Diagnosis not present

## 2014-11-21 DIAGNOSIS — I10 Essential (primary) hypertension: Secondary | ICD-10-CM | POA: Diagnosis not present

## 2014-11-21 DIAGNOSIS — G8929 Other chronic pain: Secondary | ICD-10-CM | POA: Diagnosis not present

## 2014-11-21 DIAGNOSIS — I825Y3 Chronic embolism and thrombosis of unspecified deep veins of proximal lower extremity, bilateral: Secondary | ICD-10-CM

## 2014-11-21 DIAGNOSIS — Z86010 Personal history of colonic polyps: Secondary | ICD-10-CM | POA: Diagnosis not present

## 2014-11-21 DIAGNOSIS — I2510 Atherosclerotic heart disease of native coronary artery without angina pectoris: Secondary | ICD-10-CM | POA: Diagnosis not present

## 2014-11-21 DIAGNOSIS — M79605 Pain in left leg: Secondary | ICD-10-CM

## 2014-11-21 DIAGNOSIS — K219 Gastro-esophageal reflux disease without esophagitis: Secondary | ICD-10-CM | POA: Insufficient documentation

## 2014-11-21 DIAGNOSIS — M25562 Pain in left knee: Secondary | ICD-10-CM

## 2014-11-21 MED ORDER — ALPRAZOLAM 0.5 MG PO TABS: 0.5000 mg | ORAL_TABLET | Freq: Two times a day (BID) | ORAL | Status: DC | PRN

## 2014-11-21 MED ORDER — OXYCODONE HCL 5 MG PO TABS
5.0000 mg | ORAL_TABLET | Freq: Once | ORAL | Status: AC
  Administered 2014-11-21: 5 mg via ORAL
  Filled 2014-11-21: qty 1

## 2014-11-21 NOTE — Telephone Encounter (Signed)
[facility] 

## 2014-11-21 NOTE — ED Provider Notes (Signed)
  Face-to-face evaluation   History: She presents for evaluation of left leg pain which has been present for several months and make it hard to walk. She reports that she had a ultrasound today showing a blood count in her left leg. She has chronic recurrent DVTs. She has an IVC filter in place.  Physical exam: Alert, elderly female in mild pain. Left leg not swollen. It is diffusely tender. It appears well perfused in the foot.  Medical screening examination/treatment/procedure(s) were conducted as a shared visit with non-physician practitioner(s) and myself.  I personally evaluated the patient during the encounter  Daleen Bo, MD 11/24/14 [phone]

## 2014-11-21 NOTE — ED Provider Notes (Signed)
CSN: [id number]     Arrival date & time 11/21/14  2225 History   First MD Initiated Contact with Patient 11/21/14 2247     Chief Complaint  Patient presents with  . Leg Pain     (Consider location/radiation/quality/duration/timing/severity/associated sxs/prior Treatment) HPI Comments: [name] is a 74 y.o. female with a PMHx of CAD, carotid artery disease, HLD, HTN, ulcerative colitis s/p colectomy, DM2, colonic polyps, MI, GERD, perirectal abscess with fistula s/p surgical I&D in July, depression, and recurrent DVTs s/p IVC filter placement, who presents to the ED from [facility] via EMS, with complaints of acute on chronic left knee and leg pain. She reports she has had this pain for 6 months, but last night it worsened. She describes the pain is 10/10 constant aching in the posterior left knee down the calf, worse with movement and palpation, and improved with her home oxycodone. Associated symptoms include slight warmth and erythema per the patient. EMS reporting that [place] facility sent her after performing an ultrasound that showed DVT. The patient also reports this as being true. Attempts to contact [place] were unsuccessful by me.  Patient states she is in physical therapy, and has been doing a lot of leg therapy. She denies any recent trauma or injury, LE swelling, fevers, chills, chest pain, shortness of breath, abdominal pain, nausea, vomiting, diarrhea, constipation, melena, dysuria, hematuria, numbness, tingling, or weakness. Pt has a colostomy bag, normal amount and appearance of stool. Also has indwelling urinary cath, states no changes in her urine recently.   Per chart review, notes report "On 8/31 the patient was diagnosed with Occlusive DVT in the popliteal vein and the peroneal vein on the right. No superficial thrombus. No Baker's cyst. Left lower extremity did show an acute DVT noted in the common femoral vein, femoral vein, popliteal vein, and PTV. She also has  DVT to the right. 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 The hospitalist recommended pain management, she has an IVC filter."  Patient is a 74 y.o. female presenting with leg pain. The history is provided by the patient and medical records. No language interpreter was used.  Leg Pain Location:  Leg and knee Time since incident:  6 months (worsened last night) Injury: no   Leg location:  L lower leg Knee location:  L knee Pain details:    Quality:  Aching   Radiates to:  L leg   Severity:  Severe   Onset quality:  Gradual   Duration:  6 months (but worse last night)   Timing:  Constant   Progression:  Waxing and waning Chronicity:  Recurrent Prior injury to area:  No Relieved by: home oxycodone. Worsened by:  Activity (and palpation) Ineffective treatments:  None tried Associated symptoms: no decreased ROM, no fever, no muscle weakness, no numbness, no swelling and no tingling     Past Medical History  Diagnosis Date  . CAD (coronary artery disease)     unspecified  . Cerebrovascular disease 2010    bil carotid artery disease. left CEA 02/2004. 02/2014 carotid ultrasound: < 40% bil ICA disease.   Marland Kitchen Hyperlipidemia, mixed   . HTN (hypertension)   . Ulcerative colitis 2002  . Colon polyps 2006, 2014  . Thyroid disease   . Myocardial infarct ([place]) 1998  . Diabetes mellitus without complication (HCC)     Borderline  . GERD (gastroesophageal reflux disease)   . Arthritis   . 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   . Depression 10/12/2014  . DVT (deep venous thrombosis) ([place]) 08/29/14    left LE, s/p 08/30/14 IVC filter.    Past Surgical History  Procedure Laterality Date  . Cholecystectomy  7.06.2008    with cholangiogram  . 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 Abdominal hysterectomy    . Colonoscopy  multiple  . Sigmoidoscopy  multiple  . Flexible sigmoidoscopy N/A 08/08/2014    Procedure: FLEXIBLE SIGMOIDOSCOPY;  Surgeon: Gatha Mayer, MD;  Location: [place];  Service: Endoscopy;  Laterality: N/A;  . Incision and drainage perirectal abscess N/A 08/22/2014    Procedure: IRRIGATION AND DEBRIDEMENT PERIRECTAL ABSCESS;  Surgeon: Jackolyn Confer, MD;  Location: [facility];  Service: General;  Laterality: N/A;  . Joint replacement  left knee  . Flexible sigmoidoscopy N/A 09/06/2014    Procedure: FLEXIBLE SIGMOIDOSCOPY;  Surgeon: Lafayette Dragon, MD;  Location: [facility];  Service: Endoscopy;  Laterality: N/A;  . Laparoscopic diverted colostomy N/A 09/13/2014    Procedure: LAPAROSCOPIC DIVERTED ILEOSTOMY;  Surgeon: Stark Klein, MD;  Location: [facility];  Service: General;  Laterality: N/A;  . Rectal exam under anesthesia N/A 09/13/2014    Procedure: RECTAL EXAM UNDER ANESTHESIA  AND DEBRIDEMENT OF PERIANAL WOUND;  Surgeon: Stark Klein, MD;  Location: [facility];  Service: General;  Laterality: N/A;  . Flexible sigmoidoscopy N/A 10/04/2014    Procedure: Otho Darner SIGMOIDOSCOPY;  Surgeon: Inda Castle, MD;  Location: [place];  Service: Endoscopy;  Laterality: N/A;  . Flexible sigmoidoscopy N/A 10/11/2014    Procedure: FLEXIBLE SIGMOIDOSCOPY;  Surgeon: Irene Shipper, MD;  Location: [place];  Service: Endoscopy;  Laterality: N/A;   Family History  Problem Relation Age of Onset  . Colitis Brother     1 bro with Crohn's, another bro with unspecified colitis.   . Stomach cancer Mother   . Stroke Father     died  . Colon cancer Neg Hx    Social History  Substance Use Topics  . Smoking status: Former Smoker -- 0.30 packs/day for 40 years    Types: Cigarettes    Quit date: 02/17/2013  . Smokeless tobacco: Never Used  . Alcohol Use: No   OB History    No data available     Review of Systems  Constitutional: Negative for fever and chills.  Respiratory: Negative for shortness of breath.   Cardiovascular: Negative for chest pain and leg swelling.  Gastrointestinal: Negative for nausea, vomiting, abdominal pain,  diarrhea, constipation and blood in stool.  Genitourinary: Negative for dysuria and hematuria.  Musculoskeletal: Positive for myalgias (L leg) and arthralgias (L knee/leg).  Skin: Positive for color change (slight warmth and erythema to L leg, subjectively).  Allergic/Immunologic: Positive for immunocompromised state (on remicade).  Neurological: Negative for weakness and numbness.  Psychiatric/Behavioral: Negative for confusion.   10 Systems reviewed and are negative for acute change except as noted in the HPI.    Allergies  Clindamycin/lincomycin and Ivp dye  Home Medications   Prior to Admission medications   Medication Sig Start Date End Date Taking? Authorizing Provider  acetaminophen (TYLENOL) 500 MG tablet Take 1 tablet (500 mg total) by mouth every 4 (four) hours as needed for mild pain or moderate pain. Patient taking differently: Take 1,000 mg by mouth every 4 (four) hours as needed for mild pain or moderate pain.  10/17/14   Eugenie Filler, MD  ALPRAZolam Duanne Moron) 0.5 MG tablet Take 1 tablet (0.5 mg total) by  mouth 2 (two) times daily as needed for anxiety. 11/21/14   Lauree Chandler, NP  CRESTOR 40 MG tablet TAKE 1 TABLET AT BEDTIME 05/17/14   Lelon Perla, MD  feeding supplement, ENSURE ENLIVE, (ENSURE ENLIVE) LIQD Take 237 mLs by mouth 2 (two) times daily between meals. 10/17/14   Eugenie Filler, MD  LIALDA 1.2 G EC tablet Take 4.8 g by mouth daily. 08/01/14   Historical Provider, MD  metoprolol tartrate (LOPRESSOR) 25 MG tablet Take 1 tablet by mouth 2 (two) times daily. 08/16/14   Historical Provider, MD  mirtazapine (REMERON) 15 MG tablet Take 1 tablet (15 mg total) by mouth at bedtime. 10/17/14   Eugenie Filler, MD  ondansetron (ZOFRAN) 4 MG tablet Take 1 tablet (4 mg total) by mouth every 6 (six) hours as needed for nausea. 10/17/14   Eugenie Filler, MD  oxyCODONE (OXY IR/ROXICODONE) 5 MG immediate release tablet Take one tablet by mouth every six hours for pain  10/30/14   Tiffany L Reed, DO  pantoprazole (PROTONIX) 40 MG tablet Take 1 tablet (40 mg total) by mouth daily at 6 (six) AM. 10/17/14   Eugenie Filler, MD  psyllium (HYDROCIL/METAMUCIL) 95 % PACK Take 1 packet by mouth 2 (two) times daily. Patient taking differently: Take 1 packet by mouth 2 (two) times daily. Mix in 6-8 oz of liquid and drink 08/28/14   Robbie Lis, MD  saccharomyces boulardii (FLORASTOR) 250 MG capsule Take 1 capsule (250 mg total) by mouth 2 (two) times daily. 08/03/14   Amy S [name], PA-C  sertraline (ZOLOFT) 100 MG tablet Take 1 tablet (100 mg total) by mouth daily. 10/17/14   Eugenie Filler, MD   BP 106/62 mmHg  Pulse 69  Temp(Src) 98.8 F (37.1 C) (Oral)  Resp 20  SpO2 95% Physical Exam  Constitutional: She is oriented to person, place, and time. Vital signs are normal. She appears well-developed and well-nourished.  Non-toxic appearance. No distress.  Afebrile, nontoxic, NAD  HENT:  Head: Normocephalic and atraumatic.  Mouth/Throat: Oropharynx is clear and moist and mucous membranes are normal.  Eyes: Conjunctivae and EOM are normal. Right eye exhibits no discharge. Left eye exhibits no discharge.  Neck: Normal range of motion. Neck supple.  Cardiovascular: Normal rate, regular rhythm, normal heart sounds and intact distal pulses.  Exam reveals no gallop and no friction rub.   No murmur heard. RRR, nl s1/s2, no m/r/g, distal pulses intact, 1+ bilateral pitting pedal edema up to mid-calf  Pulmonary/Chest: Effort normal and breath sounds normal. No respiratory distress. She has no decreased breath sounds. She has no wheezes. She has no rhonchi. She has no rales.  CTAB in all lung fields, no w/r/r, no hypoxia or increased WOB, speaking in full sentences, SpO2 95% on RA  Abdominal: Soft. Normal appearance and bowel sounds are normal. She exhibits no distension. There is no tenderness. There is no rigidity, no rebound, no guarding, no CVA tenderness, no tenderness  at McBurney's point and negative Murphy's sign.  Colostomy bag in place in RLQ, with stool and air present, clear skin margins around bag. Soft, NTND, +BS throughout, no r/g/r  Musculoskeletal: Normal range of motion.       Left knee: She exhibits normal range of motion, no swelling, no effusion, no erythema, normal alignment, no LCL laxity, normal patellar mobility and no MCL laxity. Tenderness found.       Right lower leg: She exhibits edema.  Left lower leg: She exhibits tenderness and edema. She exhibits no bony tenderness.       Legs: L knee with FROM intact, no joint line or bony TTP, mild tenderness to posterior knee/calf without palpable baker's cyst, no swelling/effusion/deformity, no bruising or erythema, no warmth, no abnormal alignment or patellar mobility, no varus/valgus laxity, neg anterior drawer test, no crepitus. Strength and sensation grossly intact distally. No skin changes. 1+ pitting edema bilaterally, up to mid-calf.  Neurological: She is alert and oriented to person, place, and time. She has normal strength. No sensory deficit.  Skin: Skin is warm, dry and intact. No rash noted.  Psychiatric: She has a normal mood and affect.  Nursing note and vitals reviewed.   ED Course  Procedures (including critical care time) Labs Review Labs Reviewed - No data to display  Imaging Review No results found.   09/2014 Doppler LE U/S: Progress Notes by Nani Ravens, RVT at 10/16/2014 3:45 PM    Author: Nani Ravens, RVT Service: Vascular Lab Author Type: Cardiovascular Sonographer   Filed: 10/16/2014 3:47 PM Note Time: 10/16/2014 3:45 PM Status: Signed   Editor: Nani Ravens, RVT (Cardiovascular Sonographer)     Expand All Collapse All   VASCULAR LAB PRELIMINARY PRELIMINARY PRELIMINARY PRELIMINARY  Bilateral lower extremity venous duplex completed.   Preliminary report: Right: Indeterminate age non occlusive DVT noted in the CFV. Occlusive DVT  noted in the popliteal v, and the peroneal v. No evidence of superficial thrombosis. No Baker's cyst. Left: Acute DVT noted in the CFV, FV, popliteal v, and PTV. No evidence of superficial thrombosis. No Baker's cyst.   Cestone,Helene, RVT 10/16/2014, 3:45 PM      I have personally reviewed and evaluated these images and lab results as part of my medical decision-making.   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 female here with acute on chronic L knee/leg pain. Per EMS report, U/S was done at [facility] that showed DVT. Has hx of DVTs and is not a candidate for anticoagulation due to GI bleeds, has IVC filter in place, on last admission hospitalist reported that pain management is the only other thing she needs for her DVTs. On exam, no erythema or warmth to the knee/leg, 1+ bilateral pitting edema up to midcalf, mild tenderness to posterior knee and calf. Given that there is nothing further to do for her recurrent DVTs, will give home pain meds and d/c back to SNF. Attempted to call SNF but there was no answer. Dr. Eulis Foster saw pt with me and agrees with plan to send home after pain meds given. Doubt septic arthritis or need for further work up.   12:16 AM Pain meds given, slightly improved. Will d/c home with instructions to elevate legs to help with pain, alternate between ice/heat, and use home pain meds. F/up with PCP in 5-7 days. I explained the diagnosis and have given explicit precautions to return to the ER including for any other new or worsening symptoms. The patient understands and accepts the medical plan as it's been dictated and I have answered their questions. Discharge instructions concerning home care and prescriptions have been given. The patient is STABLE and is discharged to home in good condition.  BP 115/69  mmHg  Pulse 66  Temp(Src) 98.8 F (  37.1 C) (Oral)  Resp 20  SpO2 95%  Meds ordered this encounter  Medications  . oxyCODONE (Oxy IR/ROXICODONE) immediate release tablet 5 mg    Sig:       Clatie Kessen Camprubi-Soms, PA-C 11/22/14 0017  Daleen Bo, MD 11/24/14 [phone]

## 2014-11-21 NOTE — ED Notes (Signed)
Pt arrives from [facility] c/o left upper leg pain right above knee. Pt had doppler study done at facility that results suggest +DVT. Pt has a hx of DVT and GI bleeds in the past. Leg is warm to touch.

## 2014-11-22 NOTE — Discharge Instructions (Signed)
Your leg pain is likely related to your chronic blood clots in your legs, but you have a device in your veins to help prevent this from worsening, and you don't need any further treatment for these aside from pain control. Use your regular pain medications as directed as needed for pain. Elevate your legs to help with pain and swelling. Follow up with your regular doctor in 5-7 days for recheck of symptoms. Return to the ER for changes or worsening symptoms.   Deep Vein Thrombosis A deep vein thrombosis (DVT) is a blood clot (thrombus) that usually occurs in a deep, larger vein of the lower leg or the pelvis, or in an upper extremity such as the arm. These are dangerous and can lead to serious and even life-threatening complications if the clot travels to the lungs. A DVT can damage the valves in your leg veins so that instead of flowing upward, the blood pools in the lower leg. This is called post-thrombotic syndrome, and it can result in pain, swelling, discoloration, and sores on the leg. CAUSES A DVT is caused by the formation of a blood clot in your leg, pelvis, or arm. Usually, several things contribute to the formation of blood clots. A clot may develop when:  Your blood flow slows down.  Your vein becomes damaged in some way.  You have a condition that makes your blood clot more easily. RISK FACTORS A DVT is more likely to develop in:  People who are older, especially over 76 years of age.  People who are overweight (obese).  People who sit or lie still for a long time, such as during long-distance travel (over 4 hours), bed rest, hospitalization, or during recovery from certain medical conditions like a stroke.  People who do not engage in much physical activity (sedentary lifestyle).  People who have chronic breathing disorders.  People who have a personal or family history of blood clots or blood clotting disease.  People who have peripheral vascular disease (PVD), diabetes,  or some types of cancer.  People who have heart disease, especially if the person had a recent heart attack or has congestive heart failure.  People who have neurological diseases that affect the legs (leg paresis).  People who have had a traumatic injury, such as breaking a hip or leg.  People who have recently had major or lengthy surgery, especially on the hip, knee, or abdomen.  People who have had a central line placed inside a large vein.  People who take medicines that contain the hormone estrogen. These include birth control pills and hormone replacement therapy.  Pregnancy or during childbirth or the postpartum period.  Long plane flights (over 8 hours). SIGNS AND SYMPTOMS Symptoms of a DVT can include:   Swelling of your leg or arm, especially if one side is much worse.  Warmth and redness of your leg or arm, especially if one side is much worse.  Pain in your arm or leg. If the clot is in your leg, symptoms may be more noticeable or worse when you stand or walk.  A feeling of pins and needles, if the clot is in the arm. The symptoms of a DVT that has traveled to the lungs (pulmonary embolism, PE) usually start suddenly and include:  Shortness of breath while active or at rest.  Coughing or coughing up blood or blood-tinged mucus.  Chest pain that is often worse with deep breaths.  Rapid or irregular heartbeat.  Feeling light-headed or dizzy.  Fainting.  Feeling anxious.  Sweating. There may also be pain and swelling in a leg if that is where the blood clot started. These symptoms may represent a serious problem that is an emergency. Do not wait to see if the symptoms will go away. Get medical help right away. Call your local emergency services (911 in the [place] Do not drive yourself to the hospital. DIAGNOSIS Your health care provider will take a medical history and perform a physical exam. You may also have other tests, including:  Blood tests to assess  the clotting properties of your blood.  Imaging tests, such as CT, ultrasound, MRI, X-ray, and other tests to see if you have clots anywhere in your body. TREATMENT After a DVT is identified, it can be treated. The type of treatment that you receive depends on many factors, such as the cause of your DVT, your risk for bleeding or developing more clots, and other medical conditions that you have. Sometimes, a combination of treatments is necessary. Treatment options may be combined and include:  Monitoring the blood clot with ultrasound.  Taking medicines by mouth, such as newer blood thinners (anticoagulants), thrombolytics, or warfarin.  Taking anticoagulant medicine by injection or through an IV tube.  Wearing compression stockings or using different types ofdevices.  Surgery (rare) to remove the blood clot or to place a filter in your abdomen to stop the blood clot from traveling to your lungs. Treatments for a DVT are often divided into immediate treatment and long-term treatment (up to 3 months after DVT). You can work with your health care provider to choose the treatment program that is best for you. HOME CARE INSTRUCTIONS If you are taking a newer oral anticoagulant:  Take the medicine every single day at the same time each day.  Understand what foods and drugs interact with this medicine.  Understand that there are no regular blood tests required when using this medicine.  Understand the side effects of this medicine, including excessive bruising or bleeding. Ask your health care provider or pharmacist about other possible side effects. If you are taking warfarin:  Understand how to take warfarin and know which foods can affect how warfarin works in Veterinary surgeon.  Understand that it is dangerous to take too much or too little warfarin. Too much warfarin increases the risk of bleeding. Too little warfarin continues to allow the risk for blood clots.  Follow your PT and INR blood  testing schedule. The PT and INR results allow your health care provider to adjust your dose of warfarin. It is very important that you have your PT and INR tested as often as told by your health care provider.  Avoid major changes in your diet, or tell your health care provider before you change your diet. Arrange a visit with a registered dietitian to answer your questions. Many foods, especially foods that are high in vitamin K, can interfere with warfarin and affect the PT and INR results. Eat a consistent amount of foods that are high in vitamin K, such as:  Spinach, kale, broccoli, cabbage, collard greens, turnip greens, Brussels sprouts, peas, cauliflower, seaweed, and parsley.  Beef liver and pork liver.  Green tea.  Soybean oil.  Tell your health care provider about any and all medicines, vitamins, and supplements that you take, including aspirin and other over-the-counter anti-inflammatory medicines. Be especially cautious with aspirin and anti-inflammatory medicines. Do not take those before you ask your health care provider if it is safe to  do so. This is important because many medicines can interfere with warfarin and affect the PT and INR results.  Do not start or stop taking any over-the-counter or prescription medicine unless your health care provider or pharmacist tells you to do so. If you take warfarin, you will also need to do these things:  Hold pressure over cuts for longer than usual.  Tell your dentist and other health care providers that you are taking warfarin before you have any procedures in which bleeding may occur.  Avoid alcohol or drink very small amounts. Tell your health care provider if you change your alcohol intake.  Do not use tobacco products, including cigarettes, chewing tobacco, and e-cigarettes. If you need help quitting, ask your health care provider.  Avoid contact sports. General Instructions  Take over-the-counter and prescription medicines  only as told by your health care provider. Anticoagulant medicines can have side effects, including easy bruising and difficulty stopping bleeding. If you are prescribed an anticoagulant, you will also need to do these things:  Hold pressure over cuts for longer than usual.  Tell your dentist and other health care providers that you are taking anticoagulants before you have any procedures in which bleeding may occur.  Avoid contact sports.  Wear a medical alert bracelet or carry a medical alert card that says you have had a PE.  Ask your health care provider how soon you can go back to your normal activities. Stay active to prevent new blood clots from forming.  Make sure to exercise while traveling or when you have been sitting or standing for a long period of time. It is very important to exercise. Exercise your legs by walking or by tightening and relaxing your leg muscles often. Take frequent walks.  Wear compression stockings as told by your health care provider to help prevent more blood clots from forming.  Do not use tobacco products, including cigarettes, chewing tobacco, and e-cigarettes. If you need help quitting, ask your health care provider.  Keep all follow-up appointments with your health care provider. This is important. PREVENTION Take these actions to decrease your risk of developing another DVT:  Exercise regularly. For at least 30 minutes every day, engage in:  Activity that involves moving your arms and legs.  Activity that encourages good blood flow through your body by increasing your heart rate.  Exercise your arms and legs every hour during long-distance travel (over 4 hours). Drink plenty of water and avoid drinking alcohol while traveling.  Avoid sitting or lying in bed for long periods of time without moving your legs.  Maintain a weight that is appropriate for your height. Ask your health care provider what weight is healthy for you.  If you are a woman  who is over 80 years of age, avoid unnecessary use of medicines that contain estrogen. These include birth control pills.  Do not smoke, especially if you take estrogen medicines. If you need help quitting, ask your health care provider. If you are hospitalized, prevention measures may include:  Early walking after surgery, as soon as your health care provider says that it is safe.  Receiving anticoagulants to prevent blood clots.If you cannot take anticoagulants, other options may be available, such as wearing compression stockings or using different types of devices. SEEK IMMEDIATE MEDICAL CARE IF:  You have new or increased pain, swelling, or redness in an arm or leg.  You have numbness or tingling in an arm or leg.  You have shortness of  breath while active or at rest.  You have chest pain.  You have a rapid or irregular heartbeat.  You feel light-headed or dizzy.  You cough up blood.  You notice blood in your vomit, bowel movement, or urine. These symptoms may represent a serious problem that is an emergency. Do not wait to see if the symptoms will go away. Get medical help right away. Call your local emergency services (911 in the [place] Do not drive yourself to the hospital.   This information is not intended to replace advice given to you by your health care provider. Make sure you discuss any questions you have with your health care provider.   Document Released: 02/03/2005 Document Revised: 10/25/2014 Document Reviewed: 05/31/2014 Elsevier Interactive Patient Education 2016 [employer]  Chronic Pain Chronic pain can be defined as pain that is off and on and lasts for 3-6 months or longer. Many things cause chronic pain, which can make it difficult to make a diagnosis. There are many treatment options available for chronic pain. However, finding a treatment that works well for you may require trying various approaches until the right one is found. Many people benefit  from a combination of two or more types of treatment to control their pain. SYMPTOMS  Chronic pain can occur anywhere in the body and can range from mild to very severe. Some types of chronic pain include:  Headache.  Low back pain.  Cancer pain.  Arthritis pain.  Neurogenic pain. This is pain resulting from damage to nerves. People with chronic pain may also have other symptoms such as:  Depression.  Anger.  Insomnia.  Anxiety. DIAGNOSIS  Your health care provider will help diagnose your condition over time. In many cases, the initial focus will be on excluding possible conditions that could be causing the pain. Depending on your symptoms, your health care provider may order tests to diagnose your condition. Some of these tests may include:   Blood tests.   CT scan.   MRI.   X-rays.   Ultrasounds.   Nerve conduction studies.  You may need to see a specialist.  TREATMENT  Finding treatment that works well may take time. You may be referred to a pain specialist. He or she may prescribe medicine or therapies, such as:   Mindful meditation or yoga.  Shots (injections) of numbing or pain-relieving medicines into the spine or area of pain.  Local electrical stimulation.  Acupuncture.   Massage therapy.   Aroma, color, light, or sound therapy.   Biofeedback.   Working with a physical therapist to keep from getting stiff.   Regular, gentle exercise.   Cognitive or behavioral therapy.   Group support.  Sometimes, surgery may be recommended.  HOME CARE INSTRUCTIONS   Take all medicines as directed by your health care provider.   Lessen stress in your life by relaxing and doing things such as listening to calming music.   Exercise or be active as directed by your health care provider.   Eat a healthy diet and include things such as vegetables, fruits, fish, and lean meats in your diet.   Keep all follow-up appointments with your health  care provider.   Attend a support group with others suffering from chronic pain. SEEK MEDICAL CARE IF:   Your pain gets worse.   You develop a new pain that was not there before.   You cannot tolerate medicines given to you by your health care provider.   You have new  symptoms since your last visit with your health care provider.  SEEK IMMEDIATE MEDICAL CARE IF:   You feel weak.   You have decreased sensation or numbness.   You lose control of bowel or bladder function.   Your pain suddenly gets much worse.   You develop shaking.  You develop chills.  You develop confusion.  You develop chest pain.  You develop shortness of breath.  MAKE SURE YOU:  Understand these instructions.  Will watch your condition.  Will get help right away if you are not doing well or get worse.   This information is not intended to replace advice given to you by your health care provider. Make sure you discuss any questions you have with your health care provider.   Document Released: 10/26/2001 Document Revised: 10/06/2012 Document Reviewed: 07/30/2012 Elsevier Interactive Patient Education [employer].

## 2014-11-22 NOTE — Progress Notes (Addendum)
Patient ID: [name], female   DOB: 03-19-1940, 74 y.o.   MRN: [id number]                PROGRESS NOTE  DATE:  11/21/2014            FACILITY: [facility]                  LEVEL OF CARE:   SNF   Acute Visit            CHIEF COMPLAINT:  Left leg pain.       HISTORY OF PRESENT ILLNESS:  This is a patient who has ulcerative colitis, who had surgery for a perirectal abscess.  She had recurrent lower GI bleeds.  She has an ileostomy.    Today, she walked and had significant pain in her left leg.   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  She is not a candidate for anticoagulation secondary to the bleeding she had perioperatively.  I believe she was on anticoagulation before this episode at some point, although I cannot confirm that.   There is no history of falls or trauma.  She describes the pain as being diffuse from the knee down to the foot.    REVIEW OF SYSTEMS:    CHEST/RESPIRATORY:  No shortness of breath.   CARDIAC:  No chest pain.   EDEMA/VARICOSITIES:  Extremities:   GI:  No abdominal pain.    GU:  No dysuria.    SKIN:  The surgical perirectal abscess wound looks good, according to the wound care nurse.    PHYSICAL EXAMINATION:   VITAL SIGNS:     TEMPERATURE:  97.9.     PULSE:  78.      RESPIRATIONS:  16.      BLOOD PRESSURE:  124/70.      02 SATURATIONS:  97%.     CHEST/RESPIRATORY:  Clear air entry bilaterally.    CARDIOVASCULAR:   CARDIAC:  Heart sounds are normal.   ARTERIAL:  Extremities:  Her peripheral pulses are reduced, but palpable, in the foot.  The foot is warm.  I do not believe this is an arterial issue.   MUSCULOSKELETAL:   EXTREMITIES:   LEFT LOWER EXTREMITY:  Although she yells in pain with palpation of the joints across her metatarsophalangeals and left ankle, I am again not certain that this is the issue.  She does have some swelling in the left leg, especially on the lateral aspect.  There is tenderness bilaterally on the left medial  and lateral aspects, perhaps where the greater and lesser saphenous veins are located.   There is nothing to find above the thigh.    ASSESSMENT/PLAN:               Left leg pain.  I am puzzled.  This might be superficial phlebitis or perhaps deep venous thrombosis.  I am going to go ahead and order an ultrasound of the leg to rule out DVT and see if they can comment on the superficial veins, as well.  I do not believe this is an arterial or a musculoskeletal issue.  There is no history of trauma.   .     If this is positive, she may require hospitalization for initial anticoagulation due to her recent bleeding history.

## 2014-11-27 ENCOUNTER — Non-Acute Institutional Stay (SKILLED_NURSING_FACILITY): Payer: Commercial Managed Care - HMO | Admitting: Internal Medicine

## 2014-11-27 DIAGNOSIS — K529 Noninfective gastroenteritis and colitis, unspecified: Secondary | ICD-10-CM

## 2014-11-27 DIAGNOSIS — K625 Hemorrhage of anus and rectum: Secondary | ICD-10-CM

## 2014-11-27 DIAGNOSIS — I82412 Acute embolism and thrombosis of left femoral vein: Secondary | ICD-10-CM

## 2014-11-27 DIAGNOSIS — I8012 Phlebitis and thrombophlebitis of left femoral vein: Secondary | ICD-10-CM | POA: Diagnosis not present

## 2014-11-27 DIAGNOSIS — K6389 Other specified diseases of intestine: Secondary | ICD-10-CM | POA: Diagnosis not present

## 2014-11-28 ENCOUNTER — Non-Acute Institutional Stay (SKILLED_NURSING_FACILITY): Payer: Commercial Managed Care - HMO | Admitting: Internal Medicine

## 2014-11-28 DIAGNOSIS — K625 Hemorrhage of anus and rectum: Secondary | ICD-10-CM | POA: Diagnosis not present

## 2014-11-28 DIAGNOSIS — I8012 Phlebitis and thrombophlebitis of left femoral vein: Secondary | ICD-10-CM | POA: Diagnosis not present

## 2014-11-28 DIAGNOSIS — I82412 Acute embolism and thrombosis of left femoral vein: Secondary | ICD-10-CM

## 2014-11-28 DIAGNOSIS — K529 Noninfective gastroenteritis and colitis, unspecified: Secondary | ICD-10-CM

## 2014-11-28 DIAGNOSIS — K6389 Other specified diseases of intestine: Secondary | ICD-10-CM | POA: Diagnosis not present

## 2014-11-30 ENCOUNTER — Other Ambulatory Visit: Payer: Self-pay | Admitting: *Deleted

## 2014-11-30 MED ORDER — OXYCODONE HCL 5 MG PO TABS: ORAL_TABLET | ORAL | Status: DC

## 2014-11-30 NOTE — Progress Notes (Addendum)
Patient ID: [name], female   DOB: [birth year], 74 y.o.   MRN: [id number]                PROGRESS NOTE  DATE:  11/27/2014        FACILITY: [facility]                     LEVEL OF CARE:   SNF   Acute Visit           CHIEF COMPLAINT:  Follow up left leg pain.        HISTORY OF PRESENT ILLNESS:  I had seen [name] last week.  She was complaining of extreme left leg pain.  A careful exam did not reveal an obvious cause of this, although she did have some swelling on the medial aspect of the left leg and some tenderness.     A duplex ultrasound was done in the facility noting echogenic material in the common femoral vein, superficial femoral vein, and the popliteal vein.  There was no blood flow in the superficial vein and popliteal vein.  Lack of compressibility noted with the interpretation of an occlusive DVT at the left lower extremity.    She was sent to the ER by on-call.  Nothing was done there.  No lab work.  It was noted that she had an IVC filter.  She was returned to the building.    She is still describing pain in the left leg, but actually describes this as somewhat better.    She states she is still having rectal bleeding from her perirectal abscess surgical site.    REVIEW OF SYSTEMS:    General: No fever pr chills CHEST/RESPIRATORY:  No shortness of breath.   CARDIAC:  No chest pain.   GI:  No abdominal pain.   Her ileostomy is working well.   She states she is still having rectal bleeding and bleeding from the surgical site of the perirectal abscess.     GU:  No dysuria or hematuria.   MSK: no active joint pain CNS: generalized weakness. No lateralizing weakness or loss of sensation   PHYSICAL EXAMINATION:   VITAL SIGNS:     TEMPERATURE:  97.8.     PULSE:  68.    RESPIRATIONS:  16.    BLOOD PRESSURE:  112/62.    02 SATURATIONS:  98%.     CHEST/RESPIRATORY:  Clear air entry bilaterally.    CARDIOVASCULAR:   CARDIAC:  Heart sounds are normal.  There are  no murmurs.    GASTROINTESTINAL:   ABDOMEN:  Soft, nontender.  Her ileostomy has semi-solid stool in it.   Otherwise, no masses and no tenderness.   Rectal exam:bloody mucous CIRCULATION:   ARTERIAL:  Extremities:  Her peripheral pulses are palpable.     EDEMA/VARICOSITIES:  There is minimal edema in the left leg but still a lot of tenderness, especially over the lateral aspect of the left lower leg.  There is no tenderness in the upper leg.   SKIN: out of her perirectal surgical wound is scant bleeding   ASSESSMENT/PLAN:                DVT, left leg.  The patient has a filter in place.  According to the patient and her bedside exam, she is still having rectal bleeding and bleeding from the wound.  I think it would be prohibitively risky to anticoagulate the patient at this point.  I reviewed  her hemoglobins.  They have been all stable in the mid 8 range with some fluctuation.    Ulcerative colitis and/or Crohn's disease.   She is followed with Remicade by Dr. Deatra Ina.  She has a perirectal abscess which might suggest that this is Crohn's disease.

## 2014-11-30 NOTE — Telephone Encounter (Signed)
[facility] 

## 2014-12-04 NOTE — Progress Notes (Addendum)
Patient ID: [name], female   DOB: Oct 05, 1940, 74 y.o.   MRN: [id number]                PROGRESS NOTE  DATE:  11/28/2014        FACILITY: [facility]  LEVEL OF CARE:   SNF   Acute Visit             CHIEF COMPLAINT:  Rectal bleeding.     HISTORY OF PRESENT ILLNESS:  This is a patient who has known inflammatory bowel disease, previously labeled as ulcerative colitis, although she recently had to be hospitalized before she came here with a perirectal abscess that required an I&D.    She also had a DVT and had to be placed on an IVC filter because of bleeding related to her inflammatory bowel disease.    I was asked to look at her today as she had some bloody material found in her brief.    I had actually seen her yesterday for a duplex ultrasound of her leg that showed an evolving DVT in the femoral and popliteal veins.  She was not felt to be a candidate for anticoagulation.        Past Medical History  Diagnosis Date  . CAD (coronary artery disease)     unspecified  . Cerebrovascular disease 2010    bil carotid artery disease. left CEA 02/2004. 02/2014 carotid ultrasound: < 40% bil ICA disease.   Marland Kitchen Hyperlipidemia, mixed   . HTN (hypertension)   . Ulcerative colitis 2002  . Colon polyps 2006, 2014  . Thyroid disease   . Myocardial infarct ([place]) 1998  . Diabetes mellitus without complication (HCC)     Borderline  . GERD (gastroesophageal reflux disease)   . Arthritis   . 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   . Depression 10/12/2014  . DVT (deep venous thrombosis) ([place]) 08/29/14    left LE, s/p 08/30/14 IVC filter.     REVIEW OF SYSTEMS:    GENERAL:  The patient is not complaining of fever or systemic symptoms.   CHEST/RESPIRATORY:  No shortness of breath.  No cough.    CARDIAC:  No chest pain.   GI:  No abdominal pain.   No nausea and no vomiting.    GU:  Foley catheter in place.   Extremities: states the pain in her right leg is imporved  PHYSICAL  EXAMINATION:   GENERAL APPEARANCE:  The patient does not look to be in acute medical distress.   CHEST/RESPIRATORY:  Clear air entry bilaterally.    CARDIOVASCULAR:   CARDIAC:  Heart sounds are normal.  There are no murmurs.    GASTROINTESTINAL:   ABDOMEN:  Soft.     LIVER/SPLEEN/KIDNEYS:  No liver, no spleen.   RECTAL:   It is very clear that some mucousy, bloody material is coming from her rectum, although I could not exactly be sure that this was not coming out of her surgical wound, either.  This made me think about some form of fistulous communication which will need to be followed Extremities: pain in the right leg is improving .    ASSESSMENT/PLAN:                  Rectal bleeding,  I do not think this represents an acute medical emergency.  She does not need to go to the hospital.  I will check a CBC on her.  If I had ever thought that she  might currently be a candidate for anticoagulation, this laid this thought to rest.    Surgical wound in the right buttock in the gluteal fold.  There was also some material coming out of this, although I do not see an obvious probable site here.  We are using Hydrogel wet-to-dry dressings here.  There is no evidence of infection.    History of a DVT with IVC filter, recent acute DVT in the right leg.  This seems to have resolved on its own.  She is certainly not a candidate for any form of anticoagulation.

## 2014-12-13 ENCOUNTER — Ambulatory Visit: Payer: Self-pay | Admitting: Physician Assistant

## 2014-12-14 ENCOUNTER — Encounter (HOSPITAL_COMMUNITY): Payer: Commercial Managed Care - HMO

## 2014-12-15 ENCOUNTER — Ambulatory Visit: Payer: Commercial Managed Care - HMO | Admitting: Gastroenterology

## 2014-12-20 ENCOUNTER — Ambulatory Visit: Payer: Commercial Managed Care - HMO | Admitting: Gastroenterology

## 2014-12-26 ENCOUNTER — Non-Acute Institutional Stay: Payer: Commercial Managed Care - HMO | Admitting: Internal Medicine

## 2014-12-26 DIAGNOSIS — I8012 Phlebitis and thrombophlebitis of left femoral vein: Secondary | ICD-10-CM

## 2014-12-26 DIAGNOSIS — K6389 Other specified diseases of intestine: Secondary | ICD-10-CM

## 2014-12-26 DIAGNOSIS — I82412 Acute embolism and thrombosis of left femoral vein: Secondary | ICD-10-CM

## 2014-12-26 DIAGNOSIS — R29898 Other symptoms and signs involving the musculoskeletal system: Secondary | ICD-10-CM

## 2014-12-26 DIAGNOSIS — K529 Noninfective gastroenteritis and colitis, unspecified: Secondary | ICD-10-CM

## 2014-12-28 ENCOUNTER — Encounter (HOSPITAL_COMMUNITY): Payer: Commercial Managed Care - HMO

## 2014-12-29 ENCOUNTER — Other Ambulatory Visit: Payer: Self-pay

## 2014-12-29 DIAGNOSIS — K51213 Ulcerative (chronic) proctitis with fistula: Secondary | ICD-10-CM

## 2014-12-29 MED ORDER — SODIUM CHLORIDE 0.9 % IV SOLN: 5.0000 mg/kg | INTRAVENOUS | Status: DC

## 2014-12-29 MED ORDER — ACETAMINOPHEN 325 MG PO TABS: 650.0000 mg | ORAL_TABLET | ORAL | Status: DC

## 2014-12-29 MED ORDER — DIPHENHYDRAMINE HCL 25 MG PO CAPS: 25.0000 mg | ORAL_CAPSULE | ORAL | Status: AC

## 2015-01-01 ENCOUNTER — Other Ambulatory Visit: Payer: Self-pay

## 2015-01-01 ENCOUNTER — Other Ambulatory Visit: Payer: Self-pay | Admitting: *Deleted

## 2015-01-01 DIAGNOSIS — K50019 Crohn's disease of small intestine with unspecified complications: Secondary | ICD-10-CM

## 2015-01-01 MED ORDER — OXYCODONE HCL 5 MG PO TABS: ORAL_TABLET | ORAL | Status: DC

## 2015-01-01 NOTE — Telephone Encounter (Signed)
[facility] 

## 2015-01-02 ENCOUNTER — Encounter (HOSPITAL_COMMUNITY)
Admission: RE | Admit: 2015-01-02 | Discharge: 2015-01-02 | Disposition: A | Discharge status: Home / Self Care | Payer: Commercial Managed Care - HMO | Source: Ambulatory Visit | Attending: Gastroenterology | Admitting: Gastroenterology

## 2015-01-02 VITALS — BP 104/53 | HR 62 | Temp 98.1°F | Resp 18 | Ht 69.0 in | Wt 198.0 lb

## 2015-01-02 DIAGNOSIS — K50113 Crohn's disease of large intestine with fistula: Secondary | ICD-10-CM | POA: Diagnosis not present

## 2015-01-02 DIAGNOSIS — K50019 Crohn's disease of small intestine with unspecified complications: Secondary | ICD-10-CM

## 2015-01-02 MED ORDER — SODIUM CHLORIDE 0.9 % IV SOLN
INTRAVENOUS | Status: DC
  Administered 2015-01-02: 14:00:00 via INTRAVENOUS

## 2015-01-02 MED ORDER — SODIUM CHLORIDE 0.9 % IV SOLN
5.0000 mg/kg | INTRAVENOUS | Status: DC
  Administered 2015-01-02: 300 mg via INTRAVENOUS
  Filled 2015-01-02: qty 30

## 2015-01-02 MED ORDER — ACETAMINOPHEN 325 MG PO TABS
650.0000 mg | ORAL_TABLET | ORAL | Status: DC
  Administered 2015-01-02: 650 mg via ORAL
  Filled 2015-01-02: qty 2

## 2015-01-02 MED ORDER — DIPHENHYDRAMINE HCL 25 MG PO CAPS
25.0000 mg | ORAL_CAPSULE | ORAL | Status: DC
  Administered 2015-01-02: 25 mg via ORAL
  Filled 2015-01-02: qty 1

## 2015-01-02 NOTE — Progress Notes (Addendum)
Patient ID: [name], female   DOB: 23-Aug-1940, 74 y.o.   MRN: [id number]                PROGRESS NOTE  DATE:  12/26/2014           FACILITY: [facility]                     LEVEL OF CARE:   SNF   Acute Visit                 CHIEF COMPLAINT:  Follow up multiple medical issues.       HISTORY OF PRESENT ILLNESS:  [name] is a patient who came to [place] in October.    She is an unfortunate woman who has severe inflammatory bowel disease, previously labeled as ulcerative colitis although she also has had perirectal disease, making Crohn's disease also possible.     She also had a DVT and had to be put on an IVC filter because of recurrent rectal bleeding.  In fact, she has been admitted to this building twice before I ever got a chance to see her because she had to go back to the hospital related to these issues.    I last saw her a month ago.  She had rectal bleeding, although she has not had any bleeding since.  Her original I&D of the perirectal abscess is apparently doing well, per the wound care nurse.    Past Medical History  Diagnosis Date  . CAD (coronary artery disease)     unspecified  . Cerebrovascular disease 2010    bil carotid artery disease. left CEA 02/2004. 02/2014 carotid ultrasound: < 40% bil ICA disease.   Marland Kitchen Hyperlipidemia, mixed   . HTN (hypertension)   . Ulcerative colitis 2002  . Colon polyps 2006, 2014  . Thyroid disease   . Myocardial infarct ([place]) 1998  . Diabetes mellitus without complication (HCC)     Borderline  . GERD (gastroesophageal reflux disease)   . Arthritis   . 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   . Depression 10/12/2014  . DVT (deep venous thrombosis) ([place]) 08/29/14    left LE, s/p 08/30/14 IVC filter.      CURRENT MEDICATIONS:  Medication list is reviewed.           Protonix 40 q.d.      Zoloft 100 q.d.     Mesalamine 4.8 mg (4 x 1.2 mg tablets) daily.    Lopressor 25 b.i.d.      Oxycodone 5 mg every 6 hours  routinely.    Remeron 30 mg daily.    Crestor 40 q.d.      Lamotrigine 25 mg q.h.s.      REVIEW OF SYSTEMS:    GENERAL:  The patient is not complaining of fever or chills.   CHEST/RESPIRATORY:  No shortness of breath.  No cough.   CARDIAC:  No chest pain.   GI:  No abdominal pain.   No nausea or vomiting.  Her ileostomy appears to be functioning well.   GU:  She has a Foley catheter in place for wound protection.   MUSCULOSKELETAL:  She is not currently complaining of lower extremity pain, just weakness.   NEUROLOGICAL:  She states she cannot stand to walk.    PHYSICAL EXAMINATION:   VITAL SIGNS:     TEMPERATURE:  97.7.   PULSE:  74.   RESPIRATIONS:  18.  BLOOD PRESSURE:  139/71.   02 SATURATIONS:  96%.    WEIGHT:  195 pounds, although this has been slipping since her arrival here at roughly 215 pounds.   GENERAL APPEARANCE:  The patient is not in any distress.         HEENT:   MOUTH/THROAT:  Oral exam is normal.   CHEST/RESPIRATORY:  Clear air entry bilaterally.    CARDIOVASCULAR:   CARDIAC:  Heart sounds are normal.  She appears to be euvolemic.       GASTROINTESTINAL:   ABDOMEN:  Soft.   Bowel sounds positive.    LIVER/SPLEEN/KIDNEYS:  No liver, no spleen.  No tenderness.   GENITOURINARY:   BLADDER:  During the course of examining her wound, it became obvious that she was having purulent discharge from around the Foley catheter.  At least, I think this was around the Foley catheter.  VAGINAL:  I could not see anything coming out of her vaginal vault.   SKIN:   INSPECTION:  In her right gluteal fold is the original surgical wound.   This looks a lot better.  There is no evidence of infection.    ASSESSMENT/PLAN:                Inflammatory bowel disease with rectal bleeding.  This has been reasonably stable.  I will need to check another hemoglobin.   At some point, she may be able to go back on anticoagulation.      Profound proximal lower extremity weakness.  She  states she has not walked since July and this all could be disuse.  I think it would be worthwhile checking a CK as she is on a statin.    ?Urethritis or UTI.  I did a swab of the drainage coming from around her Foley catheter.  I am going to put her on empiric antibiotics.     History of a DVT with an IVC filter.  She has not had any recent rectal bleeding (for roughly a month as far as I am aware).  I will check a CBC on her.

## 2015-01-02 NOTE — Discharge Instructions (Signed)
Infliximab injection  What is this medicine?  INFLIXIMAB (in FLIX i mab) is used to treat Crohn's disease and ulcerative colitis. It is also used to treat ankylosing spondylitis, psoriasis, and some forms of arthritis.  This medicine may be used for other purposes; ask your health care provider or pharmacist if you have questions.  What should I tell my health care provider before I take this medicine?  They need to know if you have any of these conditions:  -diabetes  -exposure to tuberculosis  -heart failure  -hepatitis or liver disease  -immune system problems  -infection  -lung or breathing disease, like COPD  -multiple sclerosis  -current or past resident of [place]  -seizure disorder  -an unusual or allergic reaction to infliximab, mouse proteins, other medicines, foods, dyes, or preservatives  -pregnant or trying to get pregnant  -breast-feeding  How should I use this medicine?  This medicine is for injection into a vein. It is usually given by a health care professional in a hospital or clinic setting.  A special MedGuide will be given to you by the pharmacist with each prescription and refill. Be sure to read this information carefully each time.  Talk to your pediatrician regarding the use of this medicine in children. Special care may be needed.  Overdosage: If you think you have taken too much of this medicine contact a poison control center or emergency room at once.  NOTE: This medicine is only for you. Do not share this medicine with others.  What if I miss a dose?  It is important not to miss your dose. Call your doctor or health care professional if you are unable to keep an appointment.  What may interact with this medicine?  Do not take this medicine with any of the following medications:  -anakinra  -rilonacept  This medicine may also interact with the following medications:  -vaccines  This list may not describe all possible interactions. Give your health care provider  a list of all the medicines, herbs, non-prescription drugs, or dietary supplements you use. Also tell them if you smoke, drink alcohol, or use illegal drugs. Some items may interact with your medicine.  What should I watch for while using this medicine?  Visit your doctor or health care professional for regular checks on your progress.  If you get a cold or other infection while receiving this medicine, call your doctor or health care professional. Do not treat yourself. This medicine may decrease your body's ability to fight infections. Before beginning therapy, your doctor may do a test to see if you have been exposed to tuberculosis.  This medicine may make the symptoms of heart failure worse in some patients. If you notice symptoms such as increased shortness of breath or swelling of the ankles or legs, contact your health care provider right away.  If you are going to have surgery or dental work, tell your health care professional or dentist that you have received this medicine.  If you take this medicine for plaque psoriasis, stay out of the sun. If you cannot avoid being in the sun, wear protective clothing and use sunscreen. Do not use sun lamps or tanning beds/booths.  What side effects may I notice from receiving this medicine?  Side effects that you should report to your doctor or health care professional as soon as possible:  -allergic reactions like skin rash, itching or hives, swelling of the face, lips, or tongue  -chest pain  -  fever or chills, usually related to the infusion  -muscle or joint pain  -red, scaly patches or raised bumps on the skin  -signs of infection - fever or chills, cough, sore throat, pain or difficulty passing urine  -swollen lymph nodes in the neck, underarm, or groin areas  -unexplained weight loss  -unusual bleeding or bruising  -unusually weak or tired  -yellowing of the eyes or skin  Side effects that usually do not require medical attention (report to your doctor or health  care professional if they continue or are bothersome):  -headache  -heartburn or stomach pain  -nausea, vomiting  This list may not describe all possible side effects. Call your doctor for medical advice about side effects. You may report side effects to FDA at 1-800-FDA-1088.  Where should I keep my medicine?  This drug is given in a hospital or clinic and will not be stored at home.  NOTE: This sheet is a summary. It may not cover all possible information. If you have questions about this medicine, talk to your doctor, pharmacist, or health care provider.     © 2016, Elsevier/Gold Standard. (2007-09-22 10:26:02)

## 2015-01-09 ENCOUNTER — Non-Acute Institutional Stay: Payer: Commercial Managed Care - HMO | Admitting: Internal Medicine

## 2015-01-09 DIAGNOSIS — K6389 Other specified diseases of intestine: Secondary | ICD-10-CM

## 2015-01-09 DIAGNOSIS — K529 Noninfective gastroenteritis and colitis, unspecified: Secondary | ICD-10-CM

## 2015-01-09 DIAGNOSIS — E43 Unspecified severe protein-calorie malnutrition: Secondary | ICD-10-CM

## 2015-01-09 DIAGNOSIS — F331 Major depressive disorder, recurrent, moderate: Secondary | ICD-10-CM | POA: Diagnosis not present

## 2015-01-09 DIAGNOSIS — K611 Rectal abscess: Secondary | ICD-10-CM | POA: Diagnosis not present

## 2015-01-18 NOTE — Progress Notes (Addendum)
Patient ID: [name], female   DOB: 12-Apr-1940, 74 y.o.   MRN: [id number]                PROGRESS NOTE  DATE:  01/09/2015         FACILITY: [facility]                      LEVEL OF CARE:   SNF   Acute Visit           CHIEF COMPLAINT:  Re-review of medical issues.      HISTORY OF PRESENT ILLNESS:  [name] is a lady who came to [place] in October.    She has severe inflammatory bowel disease, previously labeled as ulcerative colitis.  However, she also has perirectal disease, making Crohn's disease also possible.    She came to [place] after having a DVT and had to have an IVC filter because of her current rectal bleeding.  This has not been a recent problem.    She has been receiving Remicade, I think directed by Gastroenterology.  Her original I&D of a perirectal abscess is something that I have been following in the facility.    PAST MEDICAL HISTORY/PROBLEM LIST:    Past Medical History  Diagnosis Date  . CAD (coronary artery disease)     unspecified  . Cerebrovascular disease 2010    bil carotid artery disease. left CEA 02/2004. 02/2014 carotid ultrasound: < 40% bil ICA disease.   Marland Kitchen Hyperlipidemia, mixed   . HTN (hypertension)   . Ulcerative colitis 2002  . Colon polyps 2006, 2014  . Thyroid disease   . Myocardial infarct ([place]) 1998  . Diabetes mellitus without complication (HCC)     Borderline  . GERD (gastroesophageal reflux disease)   . Arthritis   . 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   . Depression 10/12/2014  . DVT (deep venous thrombosis) ([place]) 08/29/14    left LE, s/p 08/30/14 IVC filter.      . Past Surgical History  Procedure Laterality Date  . Cholecystectomy  7.06.2008    with cholangiogram  . 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 Abdominal hysterectomy    . Colonoscopy  multiple  . Sigmoidoscopy  multiple  . Flexible sigmoidoscopy N/A 08/08/2014    Procedure: FLEXIBLE SIGMOIDOSCOPY;  Surgeon: Gatha Mayer, MD;  Location: [facility];   Service: Endoscopy;  Laterality: N/A;  . Incision and drainage perirectal abscess N/A 08/22/2014    Procedure: IRRIGATION AND DEBRIDEMENT PERIRECTAL ABSCESS;  Surgeon: Jackolyn Confer, MD;  Location: [facility];  Service: General;  Laterality: N/A;  . Joint replacement  left knee  . Flexible sigmoidoscopy N/A 09/06/2014    Procedure: FLEXIBLE SIGMOIDOSCOPY;  Surgeon: Lafayette Dragon, MD;  Location: [facility];  Service: Endoscopy;  Laterality: N/A;  . Laparoscopic diverted colostomy N/A 09/13/2014    Procedure: LAPAROSCOPIC DIVERTED ILEOSTOMY;  Surgeon: Stark Klein, MD;  Location: [facility];  Service: General;  Laterality: N/A;  . Rectal exam under anesthesia N/A 09/13/2014    Procedure: RECTAL EXAM UNDER ANESTHESIA  AND DEBRIDEMENT OF PERIANAL WOUND;  Surgeon: Stark Klein, MD;  Location: [facility];  Service: General;  Laterality: N/A;  . Flexible sigmoidoscopy N/A 10/04/2014    Procedure: Otho Darner SIGMOIDOSCOPY;  Surgeon: Inda Castle, MD;  Location: [facility];  Service: Endoscopy;  Laterality: N/A;  . Flexible sigmoidoscopy N/A 10/11/2014    Procedure: FLEXIBLE SIGMOIDOSCOPY;  Surgeon: Irene Shipper, MD;  Location: [facility];  Service: Endoscopy;  Laterality: N/A;      CURRENT MEDICATIONS:  Medication list is reviewed.             Protonix 40 q.d.      Zoloft 100 q.d.     Mesalamine 1.2 g, 4 tablets/4.8 g daily.    Lopressor 25  b.i.d.      Oxycodone 5 mg q.6 hours.    Remeron 30 mg daily for depression.    Crestor 40 q.d.      Lamotrigine 25 mg daily as a mood stabilizer.     REVIEW OF SYSTEMS:    GENERAL:  The patient is not complaining of fever or chills.  She states she feels better.  Staff state she has been up walking in the parallel [place].     CHEST/RESPIRATORY:  No cough.  No sputum.    CARDIAC:  No chest pain.   GI:  No abdominal pain.   No nausea or vomiting.  Her ileostomy appears to be functioning well.    GU:  She has a Foley catheter to protect the wound.     MUSCULOSKELETAL:  She is not complaining of lower extremity pain, just weakness.   NEUROLOGICAL:  She is apparently up in the parallel bars, which is a big improvement.    PHYSICAL EXAMINATION:   VITAL SIGNS:     TEMPERATURE:  98.     PULSE:  76.   RESPIRATIONS:  18.   BLOOD PRESSURE:  124/70.    02 SATURATIONS:  94%.     CHEST/RESPIRATORY:  Clear air entry bilaterally.    CARDIOVASCULAR:   CARDIAC:  Heart sounds are normal.  She appears to be euvolemic.       GASTROINTESTINAL:   ABDOMEN:  Ileostomy site looks fine.  Abdomen is otherwise benign.  No masses.  No tenderness.    LIVER/SPLEEN/KIDNEYS:  No liver, no spleen.   CIRCULATION:   EDEMA/VARICOSITIES:  Extremities:  Venous stasis.  She has no edema.     SKIN:   WOUND REVIEW:   Her right gluteal wound is the original surgical wound.  A lot of this has epithelialized.  In the course of carefully inspecting this, I noted a small sinus but this probes roughly 2 cm deeper.  There is no evidence of infection.  No purulent drainage.    ASSESSMENT/PLAN:          Inflammatory bowel disease with rectal bleeding.  This has been stable.  Her most recent hemoglobin was 8.9, white count 5.5.  Her hemoglobin appears to be stable, previously 9.2 on 12/04/2014 and 8.9 on 10/30/2014.  Ileostomy in place. No issues with this  Surgical wound.  This looks quite good except for this small sinus.  There is no drainage.  I do not think this goes deeply.  However, we are going to have to try and pack this, likely with some silver alginate strips which I have ordered.    Mild elevation of her liver function tests.  She had an alk phos of 146, AST of 57, ALT of 43.  This was on 01/01/2015.  The cause of this is not really clear.  She is on Crestor.  Before I make any adjustments to this, I will probably recheck this in a week or two.    Protein calorie malnutrition, which is severe.  Albumin is 2.4.  She is apparently eating better, however.   I will look  forward to checking this  again in two weeks or so.     Depression.  She seems somewhat better, although I still think this is active.    History of a DVT with an IVC filter.  She has not had any gross rectal bleeding for over a month now.  If she remains stable, I think she can probably go on an anticoagulant.  I would like GI's review of this, however.     CPT CODE: [id number]

## 2015-01-23 ENCOUNTER — Non-Acute Institutional Stay (SKILLED_NURSING_FACILITY): Payer: Commercial Managed Care - HMO | Admitting: Internal Medicine

## 2015-01-23 DIAGNOSIS — K632 Fistula of intestine: Secondary | ICD-10-CM

## 2015-01-23 DIAGNOSIS — K529 Noninfective gastroenteritis and colitis, unspecified: Secondary | ICD-10-CM

## 2015-01-23 DIAGNOSIS — K6389 Other specified diseases of intestine: Secondary | ICD-10-CM

## 2015-01-23 DIAGNOSIS — Z932 Ileostomy status: Secondary | ICD-10-CM | POA: Diagnosis not present

## 2015-01-28 NOTE — Progress Notes (Addendum)
Patient ID: [name], female   DOB: 09/16/1940, 74 y.o.   MRN: [id number]                PROGRESS NOTE  DATE:  01/23/2015         FACILITY: [facility]                LEVEL OF CARE:   SNF   Acute Visit              CHIEF COMPLAINT:  ?Fistula.     HISTORY OF PRESENT ILLNESS:  [name] is a lady who came to [place] in late October.  She has severe inflammatory bowel disease, previously labeled as ulcerative colitis.  She had been receiving Remicade monthly as directed, I believe, by Dr. Deatra Ina of GI.    The patient has a wound in the right gluteal, an area which was originally the I&D of a perirectal abscess.  This had been doing well, although the wound care nurse tells me there is a probing area with drainage and that is the major reason I am seeing her today.    I was also called earlier to a report that she had a Providencia UTI.  I think the urine culture order came from Dr. Maudry Mayhew, who is the patient's primary doctor.  I am not certain how this happened although she may be going out to see him, as well.   The patient is complaining of a sensation of having to void which is painful.  She has had a Foley catheter in since she arrived here to protect the surgical area.  Apparently, that catheter was changed 5-6 days ago.    PAST MEDICAL HISTORY/PROBLEM LIST:   Past Medical History  Diagnosis Date  . CAD (coronary artery disease)     unspecified  . Cerebrovascular disease 2010    bil carotid artery disease. left CEA 02/2004. 02/2014 carotid ultrasound: < 40% bil ICA disease.   Marland Kitchen Hyperlipidemia, mixed   . HTN (hypertension)   . Ulcerative colitis 2002  . Colon polyps 2006, 2014  . Thyroid disease   . Myocardial infarct ([place]) 1998  . Diabetes mellitus without complication (HCC)     Borderline  . GERD (gastroesophageal reflux disease)   . Arthritis   . 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   . Depression 10/12/2014  . DVT (deep venous thrombosis) ([place]) 08/29/14    left  LE, s/p 08/30/14 IVC filter.      PAST SURGICAL HISTOry: Past Surgical History  Procedure Laterality Date  . Cholecystectomy  7.06.2008    with cholangiogram  . 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 Abdominal hysterectomy    . Colonoscopy  multiple  . Sigmoidoscopy  multiple  . Flexible sigmoidoscopy N/A 08/08/2014    Procedure: FLEXIBLE SIGMOIDOSCOPY;  Surgeon: Gatha Mayer, MD;  Location: [facility];  Service: Endoscopy;  Laterality: N/A;  . Incision and drainage perirectal abscess N/A 08/22/2014    Procedure: IRRIGATION AND DEBRIDEMENT PERIRECTAL ABSCESS;  Surgeon: Jackolyn Confer, MD;  Location[facility];  Service: General;  Laterality: N/A;  . Joint replacement  left knee  . Flexible sigmoidoscopy N/A 09/06/2014    Procedure: FLEXIBLE SIGMOIDOSCOPY;  Surgeon: Lafayette Dragon, MD;  Location[facility];  Service: Endoscopy;  Laterality: N/A;  . Laparoscopic diverted colostomy N/A 09/13/2014    Procedure: LAPAROSCOPIC DIVERTED ILEOSTOMY;  Surgeon: Stark Klein, MD;  Location[facility];  Service: General;  Laterality: N/A;  .  Rectal exam under anesthesia N/A 09/13/2014    Procedure: RECTAL EXAM UNDER ANESTHESIA  AND DEBRIDEMENT OF PERIANAL WOUND;  Surgeon: Stark Klein, MD;  Location[facility];  Service: General;  Laterality: N/A;  . Flexible sigmoidoscopy N/A 10/04/2014    Procedure: FLEXIBLE SIGMOIDOSCOPY;  Surgeon: Inda Castle, MD;  Location: [place];  Service: Endoscopy;  Laterality: N/A;  . Flexible sigmoidoscopy N/A 10/11/2014    Procedure: FLEXIBLE SIGMOIDOSCOPY;  Surgeon: Irene Shipper, MD;  Location: [place];  Service: Endoscopy;  Laterality: N/A;        CURRENT MEDICATIONS:  Medication list is reviewed.             Protonix 40 q.d.      Zoloft 100 q.d.      Mesalamine 1.2, 4 tablets daily.     Lopressor 25 b.i.d.      Oxycodone 5 mg q.6 hours.    Remeron 30 mg daily for depression.    Crestor 40 q.d.      Lamotrigine 25 mg daily as a mood stabilizer.      REVIEW OF SYSTEMS:    GENERAL:  The patient states she does not feel well.   CHEST/RESPIRATORY:  No cough.  No sputum.    CARDIAC:  No chest pain.   GI:  No abdominal pain.   No nausea or vomiting.  Her ileostomy, as far as I am aware, is functioning well.  She has had no further rectal bleeding that I am aware of.   GU:  Foley catheter since arrival here, as noted.   EXT: no active joint pain  PHYSICAL EXAMINATION:   VITAL SIGNS:     TEMPERATURE:  98.1.   PULSE:  78.   RESPIRATIONS:  18.   BLOOD PRESSURE:  122/78.   02 SATURATIONS:  In the normal range.     CHEST/RESPIRATORY:  Clear air entry bilaterally.    CARDIOVASCULAR:   CARDIAC:  Heart sounds are normal.  There are no murmurs.    GASTROINTESTINAL:   LIVER/SPLEEN/KIDNEYS:  No liver, no spleen.  No tenderness.   RECTAL:  I was also surprised to see a fair amount of feculent-looking drainage coming from her rectum.  Rectal exam disclosed no masses. GENITOURINARY:   BLADDER:  There is suprapubic tenderness.   No CVA tenderness.   VAGINAL:  There was no drainage coming from her vaginal area.   SKIN:   INSPECTION:  The wound in the right gluteal area actually superficially is smaller and appears to be closing in.  However, on its superior aspect is a small open area with some feculent-looking drainage that probes perhaps 3 cm.  I would agree with the wound care nurse that it is quite possible that this represents a colocutaneous fistula.      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   She has an ileostomy in place but had some feculent-looking drainage which may represent secretions from her colon rather than true stool, I would think.  They are going to use silver alginate packing here.  I do not think this needs to be extensively worked up at this point.  I may start her on Flagyl empirically, which has some use in perianal Crohn's disease.    Presumed Providencia UTI.  Over the phone, I put her on  Augmentin yesterday although I do not think that is going to be the best choice  here.  This is sensitive to third-generation cephalosporins as well as Zosyn, but I would prefer not to use IV antibiotics.  I will probably use a third-generation cephalosporin.     History of severe protein calorie malnutrition.  Her albumin in the middle of November was 2.4.  I think she has been better since, including less depressed.  I think it might be reasonable to repeat this.

## 2015-02-01 ENCOUNTER — Other Ambulatory Visit (INDEPENDENT_AMBULATORY_CARE_PROVIDER_SITE_OTHER): Payer: Commercial Managed Care - HMO

## 2015-02-01 ENCOUNTER — Ambulatory Visit (INDEPENDENT_AMBULATORY_CARE_PROVIDER_SITE_OTHER): Payer: Commercial Managed Care - HMO | Admitting: Gastroenterology

## 2015-02-01 ENCOUNTER — Encounter: Payer: Self-pay | Admitting: Gastroenterology

## 2015-02-01 VITALS — BP 110/72 | HR 88 | Ht 68.25 in

## 2015-02-01 DIAGNOSIS — K50919 Crohn's disease, unspecified, with unspecified complications: Secondary | ICD-10-CM

## 2015-02-01 DIAGNOSIS — K611 Rectal abscess: Secondary | ICD-10-CM

## 2015-02-01 DIAGNOSIS — Z23 Encounter for immunization: Secondary | ICD-10-CM

## 2015-02-01 DIAGNOSIS — Z79899 Other long term (current) drug therapy: Secondary | ICD-10-CM

## 2015-02-01 LAB — CBC WITH DIFFERENTIAL/PLATELET
BASOS ABS: 0 10*3/uL (ref 0.0–0.1)
BASOS PCT: 0.5 % (ref 0.0–3.0)
EOS ABS: 0.1 10*3/uL (ref 0.0–0.7)
Eosinophils Relative: 1.2 % (ref 0.0–5.0)
HEMATOCRIT: 34 % — AB (ref 36.0–46.0)
HEMOGLOBIN: 10.9 g/dL — AB (ref 12.0–15.0)
LYMPHS PCT: 16.7 % (ref 12.0–46.0)
Lymphs Abs: 1.4 10*3/uL (ref 0.7–4.0)
MCHC: 32.2 g/dL (ref 30.0–36.0)
MCV: 83.9 fl (ref 78.0–100.0)
MONO ABS: 0.4 10*3/uL (ref 0.1–1.0)
Monocytes Relative: 5.2 % (ref 3.0–12.0)
Neutro Abs: 6.5 10*3/uL (ref 1.4–7.7)
Neutrophils Relative %: 76.4 % (ref 43.0–77.0)
Platelets: 447 10*3/uL — ABNORMAL HIGH (ref 150.0–400.0)
RBC: 4.05 Mil/uL (ref 3.87–5.11)
RDW: 21.7 % — ABNORMAL HIGH (ref 11.5–15.5)
WBC: 8.5 10*3/uL (ref 4.0–10.5)

## 2015-02-01 LAB — COMPREHENSIVE METABOLIC PANEL
ALBUMIN: 2.8 g/dL — AB (ref 3.5–5.2)
ALT: 29 U/L (ref 0–35)
AST: 52 U/L — AB (ref 0–37)
Alkaline Phosphatase: 138 U/L — ABNORMAL HIGH (ref 39–117)
BILIRUBIN TOTAL: 0.3 mg/dL (ref 0.2–1.2)
BUN: 13 mg/dL (ref 6–23)
CALCIUM: 9.5 mg/dL (ref 8.4–10.5)
CHLORIDE: 101 meq/L (ref 96–112)
CO2: 22 mEq/L (ref 19–32)
CREATININE: 0.69 mg/dL (ref 0.40–1.20)
GFR: 106.25 mL/min (ref >60.00)
Glucose, Bld: 107 mg/dL — ABNORMAL HIGH (ref 70–99)
Potassium: 4.5 mEq/L (ref 3.5–5.1)
SODIUM: 128 meq/L — AB (ref 135–145)
TOTAL PROTEIN: 7.9 g/dL (ref 6.0–8.3)

## 2015-02-01 NOTE — Patient Instructions (Addendum)
Your physician has requested that you go to the basement for lab work before leaving today.  You have been scheduled for an MRI at [facility] (1st floor radiology) on 02/14/2015. Your appointment time is 3:00pm. Please arrive 15 minutes prior to your appointment time for registration purposes. Please make certain not to have anything to eat or drink 6 hours prior to your test. In addition, if you have any metal in your body, have a pacemaker or defibrillator, please be sure to let your ordering physician know. This test typically takes 45 minutes to 1 hour to complete.

## 2015-02-01 NOTE — Progress Notes (Signed)
HPI :  74 y/o female, former patient of Dr. Deatra Ina and new to me, here for follow. She carries a diagnosis of suspected ulcerative colitis for > 20 years, reportedly managed with mesalamine over time. She reportedly had a severe flare this past summer which was complicated by the development of a peri-rectal abscess / fistula. She had the abscess drained with debridement, and had a diverting ileostomy on 09/13/14. She was discharged on 10/17/14 and has been in a rehabilitation facility since that time. She has been debilitated and nonambulatory. She has been on Remicade since her hospitalization this past summer.   She presents for follow up with her daughter. She reports no problems with the ostomy output and the ostomy is functioning okay. She denies any significant abdominal pains. She has a mild stool output from the rectum. No blood per rectum. She is followed by wound care nurse daily who changes her dressing, and patient and daughter report the wound has been healing okay. However, per review of notes there was some concern about stool coming out of the wound and presence of a fistula. Overall the patient does think she is getting better from the wound perspective, but she continues to be weak and has a hard time walking. She has a chronic UTI for which she is on antibiotics. She has a foley catheter in place.   She has been on Remicade and Lialda at this time, on 5mg /kg Remicade every 8 weeks which she is tolerating well without complaints. Next dose scheduled for Jan 11th.    On ROS she otherwise complains of weakness and dry throat, feels the need to cough up phlegm but cannot get it up.   She had a colonoscopy in 10/2012 which showed only mild colitis activity. She had multiple flex sigs from June / August 2016 showing ? C diff on one occasion and moderate to severe colitis on the last exam.    Past Medical History  Diagnosis Date  . CAD (coronary artery disease)     unspecified  .  Cerebrovascular disease 2010    bil carotid artery disease. left CEA 02/2004. 02/2014 carotid ultrasound: < 40% bil ICA disease.   Marland Kitchen Hyperlipidemia, mixed   . HTN (hypertension)   . Ulcerative colitis 2002  . Colon polyps 2006, 2014  . Thyroid disease   . Myocardial infarct ([place]) 1998  . Diabetes mellitus without complication (HCC)     Borderline  . GERD (gastroesophageal reflux disease)   . Arthritis   . 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   . Depression 10/12/2014  . DVT (deep venous thrombosis) ([place]) 08/29/14    left LE, s/p 08/30/14 IVC filter.      Past Surgical History  Procedure Laterality Date  . Cholecystectomy  7.06.2008    with cholangiogram  . 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 Abdominal hysterectomy    . Colonoscopy  multiple  . Sigmoidoscopy  multiple  . Flexible sigmoidoscopy N/A 08/08/2014    Procedure: FLEXIBLE SIGMOIDOSCOPY;  Surgeon: Gatha Mayer, MD;  Location: [facility];  Service: Endoscopy;  Laterality: N/A;  . Incision and drainage perirectal abscess N/A 08/22/2014    Procedure: IRRIGATION AND DEBRIDEMENT PERIRECTAL ABSCESS;  Surgeon: Jackolyn Confer, MD;  Location[facility];  Service: General;  Laterality: N/A;  . Joint replacement  left knee  . Flexible sigmoidoscopy N/A 09/06/2014    Procedure: FLEXIBLE SIGMOIDOSCOPY;  Surgeon: Lafayette Dragon, MD;  [facility];  Service:  Endoscopy;  Laterality: N/A;  . Laparoscopic diverted colostomy N/A 09/13/2014    Procedure: LAPAROSCOPIC DIVERTED ILEOSTOMY;  Surgeon: Stark Klein, MD;  [facility];  Service: General;  Laterality: N/A;  . Rectal exam under anesthesia N/A 09/13/2014    Procedure: RECTAL EXAM UNDER ANESTHESIA  AND DEBRIDEMENT OF PERIANAL WOUND;  Surgeon: Stark Klein, MD;  [facility];  Service: General;  Laterality: N/A;  . Flexible sigmoidoscopy N/A 10/04/2014    Procedure: Otho Darner SIGMOIDOSCOPY;  Surgeon: Inda Castle, MD;  Location: [facility];  Service: Endoscopy;   Laterality: N/A;  . Flexible sigmoidoscopy N/A 10/11/2014    Procedure: FLEXIBLE SIGMOIDOSCOPY;  Surgeon: Irene Shipper, MD;  Location: [facility];  Service: Endoscopy;  Laterality: N/A;   Family History  Problem Relation Age of Onset  . Colitis Brother     1 bro with Crohn's, another bro with unspecified colitis.   . Stomach cancer Mother   . Stroke Father     died  . Colon cancer Neg Hx    Social History  Substance Use Topics  . Smoking status: Former Smoker -- 0.30 packs/day for 40 years    Types: Cigarettes    Quit date: 02/17/2013  . Smokeless tobacco: Never Used  . Alcohol Use: No   Current Outpatient Prescriptions  Medication Sig Dispense Refill  . acetaminophen (TYLENOL) 500 MG tablet Take 1 tablet (500 mg total) by mouth every 4 (four) hours as needed for mild pain or moderate pain. (Patient taking differently: Take 1,000 mg by mouth every 4 (four) hours as needed for mild pain or moderate pain. ) 30 tablet 0  . ALPRAZolam (XANAX) 0.5 MG tablet Take 1 tablet (0.5 mg total) by mouth 2 (two) times daily as needed for anxiety. 60 tablet 5  . CRESTOR 40 MG tablet TAKE 1 TABLET AT BEDTIME 90 tablet 2  . feeding supplement, ENSURE ENLIVE, (ENSURE ENLIVE) LIQD Take 237 mLs by mouth 2 (two) times daily between meals. 237 mL 12  . lamoTRIgine (LAMICTAL) 25 MG tablet Take 25 mg by mouth daily.    Marland Kitchen LIALDA 1.2 G EC tablet Take 4.8 g by mouth daily.  0  . metoprolol tartrate (LOPRESSOR) 25 MG tablet Take 1 tablet by mouth 2 (two) times daily.  0  . mirtazapine (REMERON) 15 MG tablet Take 1 tablet (15 mg total) by mouth at bedtime. 30 tablet 0  . ondansetron (ZOFRAN) 4 MG tablet Take 1 tablet (4 mg total) by mouth every 6 (six) hours as needed for nausea. 20 tablet 0  . oxyCODONE (OXY IR/ROXICODONE) 5 MG immediate release tablet Take one tablet by mouth every six hours for pain 120 tablet 0  . pantoprazole (PROTONIX) 40 MG tablet Take 1 tablet (40 mg total) by mouth daily at 6 (six) AM.  30 tablet 0  . psyllium (HYDROCIL/METAMUCIL) 95 % PACK Take 1 packet by mouth 2 (two) times daily. (Patient taking differently: Take 1 packet by mouth 2 (two) times daily. Mix in 6-8 oz of liquid and drink) 56 each 0  . saccharomyces boulardii (FLORASTOR) 250 MG capsule Take 1 capsule (250 mg total) by mouth 2 (two) times daily. 60 capsule 0  . sertraline (ZOLOFT) 100 MG tablet Take 1 tablet (100 mg total) by mouth daily. 30 tablet 0   Current Facility-Administered Medications  Medication Dose Route Frequency Provider Last Rate Last Dose  . acetaminophen (TYLENOL) tablet 650 mg  650 mg Oral Q8 Weeks Manus Gunning, MD      .  diphenhydrAMINE (BENADRYL) capsule 25 mg  25 mg Oral Q8 Weeks Manus Gunning, MD      . inFLIXimab (REMICADE) 5 mg/kg = 300 mg in sodium chloride 0.9 % 250 mL infusion  5 mg/kg Intravenous Q8 weeks Manus Gunning, MD       Allergies  Allergen Reactions  . Clindamycin/Lincomycin Diarrhea    Leads to colitis flare  . Ivp Dye [Iodinated Diagnostic Agents] Other (See Comments)    "almost passed out"     Review of Systems: All systems reviewed and negative except where noted in HPI.   Lab Results  Component Value Date   WBC 9.2 10/21/2014   HGB 9.3* 10/21/2014   HCT 29.1* 10/21/2014   MCV 89.0 10/21/2014   PLT 168 10/21/2014    Lab Results  Component Value Date   ALT 31 10/10/2014   AST 37 10/10/2014   ALKPHOS 86 10/10/2014   BILITOT 0.7 10/10/2014    Lab Results  Component Value Date   CREATININE 0.64 10/31/2014   BUN 8 10/31/2014   NA 132* 10/31/2014   K 5.2 Hemolyzed* 10/31/2014   CL 104 10/31/2014   CO2 17* 10/31/2014     Physical Exam: Ht 5' 8.25" (1.734 m)  Wt  Constitutional: Pleasant, female in no acute distress, sitting in wheelchair HEENT: Normocephalic and atraumatic. Conjunctivae are normal. No scleral icterus. Neck supple.  Cardiovascular: Normal rate, regular rhythm.  Pulmonary/chest: Effort normal and  breath sounds normal. No wheezing, rales or rhonchi. Abdominal: Soft, nondistended, nontender. Bowel sounds active throughout. Ostomy in RLQ, pink and healthy with brown stool in ostomy bag Extremities: no edema Lymphadenopathy: No cervical adenopathy noted. Neurological: Alert and oriented to person place and time. Skin: Skin is warm and dry. No rashes noted. Psychiatric: Normal mood and affect. Behavior is normal.   ASSESSMENT AND PLAN: 74 y/o female who has suspected longstanding UC, who presented this past summer with a severe flare complicated by peri-rectal abscess / fistula leading to surgery. Based on this complication, suspect she may more likely have Crohn's disease. She was placed on Remicade during her hospitalization and tolerating it well. She remains weak and not ambulatory but working on this at rehab. In general the patient and daughter feel her wound is improving, however notes from wound care show concern for feculent drainage and concern for a fistulous tract. She is having minimal output from her rectum at this time.   I spent time discussing the patient's course with the patient / daughter, and goals of therapy moving forward. We hope the wound continues to heal with diversion of the fecal stream via ileostomy but would recommend an MRI pelvis to ensure no ongoing fistulous tract. If this is present we may consider adding a thiopurine to Remicade to help close the tract.. I'll otherwise check baseline labs today and add on TPMT in case thiopurines are needed in the future, and will check her Remicade trough level prior to her next dose to see if she needs any escalation in her dose. She will continue to follow up with wound care and her surgeon.   I will let her know results of the MRI and labs, she can call with questions / concerns in the interim.   Miller Cellar, MD [facility] Pager [phone]

## 2015-02-02 ENCOUNTER — Other Ambulatory Visit: Payer: Self-pay

## 2015-02-05 ENCOUNTER — Other Ambulatory Visit: Payer: Self-pay | Admitting: *Deleted

## 2015-02-05 DIAGNOSIS — R7989 Other specified abnormal findings of blood chemistry: Secondary | ICD-10-CM

## 2015-02-05 DIAGNOSIS — R945 Abnormal results of liver function studies: Secondary | ICD-10-CM

## 2015-02-05 MED ORDER — OXYCODONE HCL 5 MG PO TABS: ORAL_TABLET | ORAL | Status: DC

## 2015-02-05 NOTE — Addendum Note (Signed)
Addended by: Damian Leavell S on: 02/05/2015 03:00 PM   Modules accepted: Orders

## 2015-02-05 NOTE — Telephone Encounter (Signed)
[facility] 

## 2015-02-06 ENCOUNTER — Non-Acute Institutional Stay (SKILLED_NURSING_FACILITY): Payer: Commercial Managed Care - HMO | Admitting: Internal Medicine

## 2015-02-06 DIAGNOSIS — K632 Fistula of intestine: Secondary | ICD-10-CM | POA: Diagnosis not present

## 2015-02-06 DIAGNOSIS — K529 Noninfective gastroenteritis and colitis, unspecified: Secondary | ICD-10-CM

## 2015-02-06 DIAGNOSIS — K6389 Other specified diseases of intestine: Secondary | ICD-10-CM

## 2015-02-06 LAB — THIOPURINE METHYLTRANSFERASE (TPMT), RBC: THIOPURINE METHYLTRANSFERASE, RBC: 10 nmol/h/mL — AB

## 2015-02-08 ENCOUNTER — Encounter (HOSPITAL_COMMUNITY): Payer: Commercial Managed Care - HMO

## 2015-02-13 NOTE — Progress Notes (Addendum)
Patient ID: [name], female   DOB: [birth year], 74 y.o.   MRN: [id number]                PROGRESS NOTE  DATE:  02/06/2015         FACILITY: [facility]             LEVEL OF CARE:   SNF   Acute Visit              CHIEF COMPLAINT:  Pelvic pain.      HISTORY OF PRESENT ILLNESS:  Once again, I am seeing [name] for complaints of difficult to characterize, multifaceted pain in her pelvis.    She is a lady who has inflammatory bowel disease, previously labeled as ulcerative colitis.    She came to [place] in late October after having an I&D of a peri-rectal abscess.  The abscess did fairly well in terms of healing.  However, more recently she has developed a site at the tip of where the abscess was and it probes internally roughly 5 cm.  There is drainage.  She has also complained of pain when she voids or has a feeling of voiding, although she has a Foley catheter.  A recent culture of this showed Providencia for which I gave her 10 days of an oral third-generation cephalosporin.    I note that she has also been to see Gastroenterology recently and an MRI of the pelvis was ordered, which seems reasonable.    PAST MEDICAL HISTORY/PROBLEM LIST Past Medical History  Diagnosis Date  . CAD (coronary artery disease)     unspecified  . Cerebrovascular disease 2010    bil carotid artery disease. left CEA 02/2004. 02/2014 carotid ultrasound: < 40% bil ICA disease.   Marland Kitchen Hyperlipidemia, mixed   . HTN (hypertension)   . Ulcerative colitis 2002  . Colon polyps 2006, 2014  . Thyroid disease   . Myocardial infarct ([place]) 1998  . Diabetes mellitus without complication (HCC)     Borderline  . GERD (gastroesophageal reflux disease)   . Arthritis   . 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   . Depression 10/12/2014  . DVT (deep venous thrombosis) ([place]) 08/29/14    left LE, s/p 08/30/14 IVC filter.     PAST SURGICAL HISTORY:   Past Surgical History  Procedure Laterality Date  .  Cholecystectomy  7.06.2008    with cholangiogram  . 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 Abdominal hysterectomy    . Colonoscopy  multiple  . Sigmoidoscopy  multiple  . Flexible sigmoidoscopy N/A 08/08/2014    Procedure: FLEXIBLE SIGMOIDOSCOPY;  Surgeon: Gatha Mayer, MD;  Location: [facility];  Service: Endoscopy;  Laterality: N/A;  . Incision and drainage perirectal abscess N/A 08/22/2014    Procedure: IRRIGATION AND DEBRIDEMENT PERIRECTAL ABSCESS;  Surgeon: Jackolyn Confer, MD;  [facility];  Service: General;  Laterality: N/A;  . Joint replacement  left knee  . Flexible sigmoidoscopy N/A 09/06/2014    Procedure: FLEXIBLE SIGMOIDOSCOPY;  Surgeon: Lafayette Dragon, MD;  [facility];  Service: Endoscopy;  Laterality: N/A;  . Laparoscopic diverted colostomy N/A 09/13/2014    Procedure: LAPAROSCOPIC DIVERTED ILEOSTOMY;  Surgeon: Stark Klein, MD;  [facility];  Service: General;  Laterality: N/A;  . Rectal exam under anesthesia N/A 09/13/2014    Procedure: RECTAL EXAM UNDER ANESTHESIA  AND DEBRIDEMENT OF PERIANAL WOUND;  Surgeon: Stark Klein, MD;  [facility];  Service: General;  Laterality: N/A;  . Flexible sigmoidoscopy N/A 10/04/2014    Procedure: FLEXIBLE SIGMOIDOSCOPY;  Surgeon: Inda Castle, MD;  Location: [facility];  Service: Endoscopy;  Laterality: N/A;  . Flexible sigmoidoscopy N/A 10/11/2014    Procedure: FLEXIBLE SIGMOIDOSCOPY;  Surgeon: Irene Shipper, MD;  Location: [place];  Service: Endoscopy;  Laterality: N/A;     CURRENT MEDICATIONS:  Medication list is reviewed.         Protonix 40 q.d.           Zoloft 100 q.d.     Mesalamine 1.2, 4 tablets daily.     Lopressor 25 b.i.d.     Remeron 30 q.d.     Crestor 40 q.d.     Lamotrigine 25 mg daily.    Remicade as directed by GI.    REVIEW OF SYSTEMS:    GENERAL:  The patient states she is having a lot of discomfort.     CHEST/RESPIRATORY:  No cough.  No sputum.    CARDIAC:  No  chest pain.   GI:  No clear abdominal pain.   Her ostomy is apparently functioning.  She has not had any rectal bleeding.   GU:  Foley catheter since her arrival here.    MUSCULOSKELETAL:  Extremities:  No active joint pain.      PHYSICAL EXAMINATION:   VITAL SIGNS:     TEMPERATURE:  97.6.   PULSE:  74.   RESPIRATIONS:  20.    BLOOD PRESSURE:  130/68.   02 SATURATIONS:  97% on room air.   CHEST/RESPIRATORY:  Clear air entry bilaterally.    CARDIOVASCULAR:   CARDIAC:  Heart sounds are normal.  No dehydration.      GASTROINTESTINAL:   ABDOMEN:  Soft, nontender.  Bowel sounds are positive.   GENITOURINARY:  The area that I noted and examined two weeks ago, which is at the superior aspect of her original surgical site, is now considerably more open.  There is some foul-smelling drainage here.  This probes in roughly 4-5 cm.   BLADDER:  The Foley catheter appears to be in place.  I do not see any peri-catheter leakage.    ASSESSMENT/PLAN:               Inflammatory bowel disease, ?Crohn's disease.  I certainly agree with the MRI.  I will wait to see what this shows in terms of fistula tracts in the pelvis.  I wonder whether she has a rectocutaneous fistula here.  I also wonder about whether there is some form of urinary diversion as the patient continues to have the feeling that she is voiding and/or has painful voiding.  I will ask for another urinary culture.  I have her on oral flagyl related to her perianal crohns  Hyponatremia.  Her sodium is 128 on lab work I had done presumably when she went to see GI on 02/01/2015.  I do not have a good idea of why this is.  She does not look prerenal.  Her white count  was 8.5, hemoglobin 10.9 which is stable for her.  Differential count was normal.  Her albumin at 2.8 is actually better than what it had been.  BUN and creatinine are normal.  I am not exactly sure why her sodium is low although I see, from looking through her previous lab work, that it is  generally in the low 130 range.    We will put silver alginate in the fistula-like opening.  Await the result of the MRI.  I will order a urine culture.  At this point, I am not certain whether the better thing to do would be to remove the Foley or leave it in.  I think I will leave it in until we are able to see the MRI result.  She is supposed to follow up with Surgery in early to mid January.

## 2015-02-14 ENCOUNTER — Ambulatory Visit (HOSPITAL_COMMUNITY)
Admission: RE | Admit: 2015-02-14 | Discharge: 2015-02-14 | Disposition: A | Discharge status: Home / Self Care | Payer: Commercial Managed Care - HMO | Source: Ambulatory Visit | Attending: Gastroenterology | Admitting: Gastroenterology

## 2015-02-14 DIAGNOSIS — K50919 Crohn's disease, unspecified, with unspecified complications: Secondary | ICD-10-CM | POA: Insufficient documentation

## 2015-02-14 DIAGNOSIS — K611 Rectal abscess: Secondary | ICD-10-CM | POA: Insufficient documentation

## 2015-02-14 MED ORDER — GADOBENATE DIMEGLUMINE 529 MG/ML IV SOLN
20.0000 mL | Freq: Once | INTRAVENOUS | Status: AC | PRN
  Administered 2015-02-14: 19 mL via INTRAVENOUS

## 2015-02-15 ENCOUNTER — Other Ambulatory Visit: Payer: Self-pay

## 2015-02-15 DIAGNOSIS — K50113 Crohn's disease of large intestine with fistula: Secondary | ICD-10-CM

## 2015-02-22 ENCOUNTER — Encounter (HOSPITAL_COMMUNITY): Payer: Commercial Managed Care - HMO

## 2015-02-23 ENCOUNTER — Other Ambulatory Visit: Payer: Self-pay | Admitting: Internal Medicine

## 2015-02-28 ENCOUNTER — Telehealth: Payer: Self-pay | Admitting: *Deleted

## 2015-02-28 ENCOUNTER — Other Ambulatory Visit: Payer: Self-pay | Admitting: *Deleted

## 2015-02-28 ENCOUNTER — Encounter (HOSPITAL_COMMUNITY): Admission: RE | Admit: 2015-02-28 | Payer: Commercial Managed Care - HMO | Source: Ambulatory Visit

## 2015-02-28 DIAGNOSIS — K50018 Crohn's disease of small intestine with other complication: Secondary | ICD-10-CM

## 2015-02-28 NOTE — Telephone Encounter (Signed)
Patient did not show for her Remicade today at short stay. Called patient's daughter and she states [name] is supposed to bring patient to appointment. [facility] and rescheduled patient to 03/09/15 at 9:00 AM for Remicade. Reinaldo Meeker at [phone] and spoke Ulice Dash the transportation person and gave her the new appointment day and time.

## 2015-02-28 NOTE — Progress Notes (Signed)
Patient did not come to short stay for Remicade appointment today. Leone Payor RN at MD office made aware.

## 2015-03-06 ENCOUNTER — Encounter: Payer: Self-pay | Admitting: Family

## 2015-03-07 ENCOUNTER — Other Ambulatory Visit (HOSPITAL_COMMUNITY): Payer: Self-pay | Admitting: Internal Medicine

## 2015-03-07 DIAGNOSIS — R1310 Dysphagia, unspecified: Secondary | ICD-10-CM

## 2015-03-09 ENCOUNTER — Encounter (HOSPITAL_COMMUNITY): Payer: Self-pay

## 2015-03-09 ENCOUNTER — Encounter (HOSPITAL_COMMUNITY)
Admission: RE | Admit: 2015-03-09 | Discharge: 2015-03-09 | Disposition: A | Discharge status: Home / Self Care | Payer: Commercial Managed Care - HMO | Source: Ambulatory Visit | Attending: Gastroenterology | Admitting: Gastroenterology

## 2015-03-09 VITALS — BP 100/62 | HR 72 | Temp 97.6°F | Resp 18 | Ht 69.0 in | Wt 171.3 lb

## 2015-03-09 DIAGNOSIS — K50019 Crohn's disease of small intestine with unspecified complications: Secondary | ICD-10-CM

## 2015-03-09 DIAGNOSIS — K50113 Crohn's disease of large intestine with fistula: Secondary | ICD-10-CM | POA: Insufficient documentation

## 2015-03-09 MED ORDER — DIPHENHYDRAMINE HCL 25 MG PO CAPS
25.0000 mg | ORAL_CAPSULE | ORAL | Status: DC
  Administered 2015-03-09: 25 mg via ORAL
  Filled 2015-03-09: qty 1

## 2015-03-09 MED ORDER — ACETAMINOPHEN 325 MG PO TABS
650.0000 mg | ORAL_TABLET | ORAL | Status: DC
  Administered 2015-03-09: 650 mg via ORAL
  Filled 2015-03-09: qty 2

## 2015-03-09 MED ORDER — SODIUM CHLORIDE 0.9 % IV SOLN
5.0000 mg/kg | INTRAVENOUS | Status: DC
  Administered 2015-03-09: 400 mg via INTRAVENOUS
  Filled 2015-03-09: qty 40

## 2015-03-09 MED ORDER — SODIUM CHLORIDE 0.9 % IV SOLN
INTRAVENOUS | Status: AC
  Administered 2015-03-09: 10:00:00 via INTRAVENOUS

## 2015-03-09 NOTE — Discharge Instructions (Signed)
Infliximab injection  What is this medicine?  INFLIXIMAB (in FLIX i mab) is used to treat Crohn's disease and ulcerative colitis. It is also used to treat ankylosing spondylitis, psoriasis, and some forms of arthritis.  This medicine may be used for other purposes; ask your health care provider or pharmacist if you have questions.  What should I tell my health care provider before I take this medicine?  They need to know if you have any of these conditions:  -diabetes  -exposure to tuberculosis  -heart failure  -hepatitis or liver disease  -immune system problems  -infection  -lung or breathing disease, like COPD  -multiple sclerosis  -current or past resident of [place]  -seizure disorder  -an unusual or allergic reaction to infliximab, mouse proteins, other medicines, foods, dyes, or preservatives  -pregnant or trying to get pregnant  -breast-feeding  How should I use this medicine?  This medicine is for injection into a vein. It is usually given by a health care professional in a hospital or clinic setting.  A special MedGuide will be given to you by the pharmacist with each prescription and refill. Be sure to read this information carefully each time.  Talk to your pediatrician regarding the use of this medicine in children. Special care may be needed.  Overdosage: If you think you have taken too much of this medicine contact a poison control center or emergency room at once.  NOTE: This medicine is only for you. Do not share this medicine with others.  What if I miss a dose?  It is important not to miss your dose. Call your doctor or health care professional if you are unable to keep an appointment.  What may interact with this medicine?  Do not take this medicine with any of the following medications:  -anakinra  -rilonacept  This medicine may also interact with the following medications:  -vaccines  This list may not describe all possible interactions. Give your health care provider  a list of all the medicines, herbs, non-prescription drugs, or dietary supplements you use. Also tell them if you smoke, drink alcohol, or use illegal drugs. Some items may interact with your medicine.  What should I watch for while using this medicine?  Visit your doctor or health care professional for regular checks on your progress.  If you get a cold or other infection while receiving this medicine, call your doctor or health care professional. Do not treat yourself. This medicine may decrease your body's ability to fight infections. Before beginning therapy, your doctor may do a test to see if you have been exposed to tuberculosis.  This medicine may make the symptoms of heart failure worse in some patients. If you notice symptoms such as increased shortness of breath or swelling of the ankles or legs, contact your health care provider right away.  If you are going to have surgery or dental work, tell your health care professional or dentist that you have received this medicine.  If you take this medicine for plaque psoriasis, stay out of the sun. If you cannot avoid being in the sun, wear protective clothing and use sunscreen. Do not use sun lamps or tanning beds/booths.  What side effects may I notice from receiving this medicine?  Side effects that you should report to your doctor or health care professional as soon as possible:  -allergic reactions like skin rash, itching or hives, swelling of the face, lips, or tongue  -chest pain  -  fever or chills, usually related to the infusion  -muscle or joint pain  -red, scaly patches or raised bumps on the skin  -signs of infection - fever or chills, cough, sore throat, pain or difficulty passing urine  -swollen lymph nodes in the neck, underarm, or groin areas  -unexplained weight loss  -unusual bleeding or bruising  -unusually weak or tired  -yellowing of the eyes or skin  Side effects that usually do not require medical attention (report to your doctor or health  care professional if they continue or are bothersome):  -headache  -heartburn or stomach pain  -nausea, vomiting  This list may not describe all possible side effects. Call your doctor for medical advice about side effects. You may report side effects to FDA at 1-800-FDA-1088.  Where should I keep my medicine?  This drug is given in a hospital or clinic and will not be stored at home.  NOTE: This sheet is a summary. It may not cover all possible information. If you have questions about this medicine, talk to your doctor, pharmacist, or health care provider.     © 2016, Elsevier/Gold Standard. (2007-09-22 10:26:02)

## 2015-03-09 NOTE — Progress Notes (Signed)
Pt. Taking Flagyl, indefinitely,Dr. Armbruster office  Aware, ok to continue with Remicade infusion.

## 2015-03-12 ENCOUNTER — Ambulatory Visit (HOSPITAL_COMMUNITY)
Admission: RE | Admit: 2015-03-12 | Discharge: 2015-03-12 | Disposition: A | Discharge status: Home / Self Care | Payer: Commercial Managed Care - HMO | Source: Ambulatory Visit | Attending: Internal Medicine | Admitting: Internal Medicine

## 2015-03-12 DIAGNOSIS — R1310 Dysphagia, unspecified: Secondary | ICD-10-CM

## 2015-03-12 DIAGNOSIS — Z029 Encounter for administrative examinations, unspecified: Secondary | ICD-10-CM | POA: Insufficient documentation

## 2015-03-14 ENCOUNTER — Encounter: Payer: Self-pay | Admitting: Family

## 2015-03-14 ENCOUNTER — Ambulatory Visit (HOSPITAL_COMMUNITY)
Admission: RE | Admit: 2015-03-14 | Discharge: 2015-03-14 | Disposition: A | Discharge status: Home / Self Care | Payer: Commercial Managed Care - HMO | Source: Ambulatory Visit | Attending: Family | Admitting: Family

## 2015-03-14 ENCOUNTER — Ambulatory Visit (INDEPENDENT_AMBULATORY_CARE_PROVIDER_SITE_OTHER): Payer: Commercial Managed Care - HMO | Admitting: Family

## 2015-03-14 VITALS — BP 96/67 | HR 93 | Resp 16 | Ht 69.5 in | Wt 135.0 lb

## 2015-03-14 DIAGNOSIS — Z87891 Personal history of nicotine dependence: Secondary | ICD-10-CM | POA: Diagnosis not present

## 2015-03-14 DIAGNOSIS — I6523 Occlusion and stenosis of bilateral carotid arteries: Secondary | ICD-10-CM | POA: Diagnosis not present

## 2015-03-14 DIAGNOSIS — E782 Mixed hyperlipidemia: Secondary | ICD-10-CM | POA: Diagnosis not present

## 2015-03-14 DIAGNOSIS — I10 Essential (primary) hypertension: Secondary | ICD-10-CM | POA: Insufficient documentation

## 2015-03-14 DIAGNOSIS — E119 Type 2 diabetes mellitus without complications: Secondary | ICD-10-CM | POA: Diagnosis not present

## 2015-03-14 NOTE — Patient Instructions (Signed)
Stroke Prevention Some medical conditions and behaviors are associated with an increased chance of having a stroke. You may prevent a stroke by making healthy choices and managing medical conditions. HOW CAN I REDUCE MY RISK OF HAVING A STROKE?   Stay physically active. Get at least 30 minutes of activity on most or all days.  Do not smoke. It may also be helpful to avoid exposure to secondhand smoke.  Limit alcohol use. Moderate alcohol use is considered to be:  No more than 2 drinks per day for men.  No more than 1 drink per day for nonpregnant women.  Eat healthy foods. This involves:  Eating 5 or more servings of fruits and vegetables a day.  Making dietary changes that address high blood pressure (hypertension), high cholesterol, diabetes, or obesity.  Manage your cholesterol levels.  Making food choices that are high in fiber and low in saturated fat, trans fat, and cholesterol may control cholesterol levels.  Take any prescribed medicines to control cholesterol as directed by your health care provider.  Manage your diabetes.  Controlling your carbohydrate and sugar intake is recommended to manage diabetes.  Take any prescribed medicines to control diabetes as directed by your health care provider.  Control your hypertension.  Making food choices that are low in salt (sodium), saturated fat, trans fat, and cholesterol is recommended to manage hypertension.  Ask your health care provider if you need treatment to lower your blood pressure. Take any prescribed medicines to control hypertension as directed by your health care provider.  If you are 18-39 years of age, have your blood pressure checked every 3-5 years. If you are 40 years of age or older, have your blood pressure checked every year.  Maintain a healthy weight.  Reducing calorie intake and making food choices that are low in sodium, saturated fat, trans fat, and cholesterol are recommended to manage  weight.  Stop drug abuse.  Avoid taking birth control pills.  Talk to your health care provider about the risks of taking birth control pills if you are over 35 years old, smoke, get migraines, or have ever had a blood clot.  Get evaluated for sleep disorders (sleep apnea).  Talk to your health care provider about getting a sleep evaluation if you snore a lot or have excessive sleepiness.  Take medicines only as directed by your health care provider.  For some people, aspirin or blood thinners (anticoagulants) are helpful in reducing the risk of forming abnormal blood clots that can lead to stroke. If you have the irregular heart rhythm of atrial fibrillation, you should be on a blood thinner unless there is a good reason you cannot take them.  Understand all your medicine instructions.  Make sure that other conditions (such as anemia or atherosclerosis) are addressed. SEEK IMMEDIATE MEDICAL CARE IF:   You have sudden weakness or numbness of the face, arm, or leg, especially on one side of the body.  Your face or eyelid droops to one side.  You have sudden confusion.  You have trouble speaking (aphasia) or understanding.  You have sudden trouble seeing in one or both eyes.  You have sudden trouble walking.  You have dizziness.  You have a loss of balance or coordination.  You have a sudden, severe headache with no known cause.  You have new chest pain or an irregular heartbeat. Any of these symptoms may represent a serious problem that is an emergency. Do not wait to see if the symptoms will   go away. Get medical help at once. Call your local emergency services (911 in [place] Do not drive yourself to the hospital.   This information is not intended to replace advice given to you by your health care provider. Make sure you discuss any questions you have with your health care provider.   Document Released: 03/13/2004 Document Revised: 02/24/2014 Document Reviewed:  08/06/2012 Elsevier Interactive Patient Education 2016 [employer]  

## 2015-03-14 NOTE — Progress Notes (Signed)
Chief Complaint: Extracranial Carotid Artery Stenosis   History of Present Illness  [name] is a 75 y.o. female  patient of Dr. Scot Dock followed for known extracranial carotid artery stenosis. She has not had a carotid artery intervention. She denies any history of stroke or TIA, denies non-healing wounds in her feet/legs.  She does not walk much as she feels too weak to walk since her rectal abscess developed in June 2016. She is in [facility] [place] rehab center, hoping to get well enough to go home with her husband and 2 adult children. Pt states she would like to know why she is so weak and has no appetite.  She has lost a great deal of weight; pt denies post prandial abdominal pain.   He had a 3 vessel CABG in 2006 which includes LIMA.  She had a colostomy placed in June 2016, daughter states for rectal abscess.  Pt states she has spoken with Dr. Delfina Redwood about dysuria, states she was on antibx which did not help this feeling; noted that she has a foley catheter in place with the catheter bag visible.  Pt Diabetic: Yes, review of records: last A1C on file was 6.5, January 2015 Pt smoker: former smoker, quit January 2015, started smoking in her teens.  Pt meds include: Statin : Yes Betablocker: Yes ASA: no Other anticoagulants/antiplatelets: no    Past Medical History  Diagnosis Date  . CAD (coronary artery disease)     unspecified  . Cerebrovascular disease 2010    bil carotid artery disease. left CEA 02/2004. 02/2014 carotid ultrasound: < 40% bil ICA disease.   Marland Kitchen Hyperlipidemia, mixed   . HTN (hypertension)   . Ulcerative colitis 2002  . Colon polyps 2006, 2014  . Thyroid disease   . Myocardial infarct ([place]) 1998  . Diabetes mellitus without complication (HCC)     Borderline  . GERD (gastroesophageal reflux disease)   . Arthritis   . 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   . Depression 10/12/2014  . DVT (deep venous thrombosis) ([place]) 08/29/14    left LE, s/p  08/30/14 IVC filter.     Social History Social History  Substance Use Topics  . Smoking status: Former Smoker -- 0.30 packs/day for 40 years    Types: Cigarettes    Quit date: 02/17/2013  . Smokeless tobacco: Never Used  . Alcohol Use: No    Family History Family History  Problem Relation Age of Onset  . Colitis Brother     1 bro with Crohn's, another bro with unspecified colitis.   . Stomach cancer Mother   . Stroke Father     died  . Colon cancer Neg Hx     Surgical History Past Surgical History  Procedure Laterality Date  . Cholecystectomy  7.06.2008    with cholangiogram  . 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 Abdominal hysterectomy    . Colonoscopy  multiple  . Sigmoidoscopy  multiple  . Flexible sigmoidoscopy N/A 08/08/2014    Procedure: FLEXIBLE SIGMOIDOSCOPY;  Surgeon: Gatha Mayer, MD;  Location: [place];  Service: Endoscopy;  Laterality: N/A;  . Incision and drainage perirectal abscess N/A 08/22/2014    Procedure: IRRIGATION AND DEBRIDEMENT PERIRECTAL ABSCESS;  Surgeon: Jackolyn Confer, MD;  Location[facility];  Service: General;  Laterality: N/A;  . Joint replacement  left knee  . Flexible sigmoidoscopy N/A 09/06/2014    Procedure: FLEXIBLE SIGMOIDOSCOPY;  Surgeon: Lafayette Dragon, MD;  [facility];  Service: Endoscopy;  Laterality: N/A;  . Laparoscopic diverted colostomy N/A 09/13/2014    Procedure: LAPAROSCOPIC DIVERTED ILEOSTOMY;  Surgeon: Stark Klein, MD;  Location[facility];  Service: General;  Laterality: N/A;  . Rectal exam under anesthesia N/A 09/13/2014    Procedure: RECTAL EXAM UNDER ANESTHESIA  AND DEBRIDEMENT OF PERIANAL WOUND;  Surgeon: Stark Klein, MD;  Location[facility];  Service: General;  Laterality: N/A;  . Flexible sigmoidoscopy N/A 10/04/2014    Procedure: Otho Darner SIGMOIDOSCOPY;  Surgeon: Inda Castle, MD;  Location: [place];  Service: Endoscopy;  Laterality: N/A;  . Flexible sigmoidoscopy N/A 10/11/2014    Procedure:  FLEXIBLE SIGMOIDOSCOPY;  Surgeon: Irene Shipper, MD;  Location: [place];  Service: Endoscopy;  Laterality: N/A;    Allergies  Allergen Reactions  . Clindamycin/Lincomycin Diarrhea    Leads to colitis flare  . Ivp Dye [Iodinated Diagnostic Agents] Other (See Comments)    "almost passed out"    Current Outpatient Prescriptions  Medication Sig Dispense Refill  . acetaminophen (TYLENOL) 500 MG tablet Take 1 tablet (500 mg total) by mouth every 4 (four) hours as needed for mild pain or moderate pain. (Patient taking differently: Take 1,000 mg by mouth every 4 (four) hours as needed for mild pain or moderate pain. ) 30 tablet 0  . ALPRAZolam (XANAX) 0.5 MG tablet Take 1 tablet (0.5 mg total) by mouth 2 (two) times daily as needed for anxiety. 60 tablet 5  . CRESTOR 40 MG tablet TAKE 1 TABLET AT BEDTIME 90 tablet 2  . lamoTRIgine (LAMICTAL) 25 MG tablet Take 25 mg by mouth daily.    Marland Kitchen LIALDA 1.2 G EC tablet Take 4.8 g by mouth daily.  0  . metoprolol tartrate (LOPRESSOR) 25 MG tablet Take 1 tablet by mouth 2 (two) times daily.  0  . metroNIDAZOLE (FLAGYL) 500 MG tablet Take 500 mg by mouth 3 (three) times daily.    . mirtazapine (REMERON) 15 MG tablet Take 1 tablet (15 mg total) by mouth at bedtime. 30 tablet 0  . ondansetron (ZOFRAN) 4 MG tablet Take 1 tablet (4 mg total) by mouth every 6 (six) hours as needed for nausea. 20 tablet 0  . oxyCODONE (OXY IR/ROXICODONE) 5 MG immediate release tablet Take one tablet by mouth every six hours for pain 120 tablet 0  . pantoprazole (PROTONIX) 40 MG tablet Take 1 tablet (40 mg total) by mouth daily at 6 (six) AM. 30 tablet 0  . sertraline (ZOLOFT) 100 MG tablet Take 1 tablet (100 mg total) by mouth daily. 30 tablet 0   Current Facility-Administered Medications  Medication Dose Route Frequency Provider Last Rate Last Dose  . acetaminophen (TYLENOL) tablet 650 mg  650 mg Oral Q8 Weeks Manus Gunning, MD      . inFLIXimab (REMICADE) 5 mg/kg =  300 mg in sodium chloride 0.9 % 250 mL infusion  5 mg/kg Intravenous Q8 weeks Manus Gunning, MD        Review of Systems : See HPI for pertinent positives and negatives.  Physical Examination  Filed Vitals:   03/14/15 1217 03/14/15 1225  BP: 88/65 96/67  Pulse: 93 93  Resp: 16   Height: 5' 9.5" (1.765 m)   Weight: 135 lb (61.236 kg)   SpO2: 95%    Body mass index is 19.66 kg/(m^2).   General: WDWN cachectic female. GAIT: normal Eyes: PERRLA Pulmonary: CTAB, no rales,  rhonchi, or wheezing.   Cardiac: Regular rhythm,no detected murmur.  VASCULAR EXAM Carotid Bruits Left Right   Negative Negative   Aorta is not palpable Radial pulses are 2+ palpable and equal.      LE Pulses LEFT RIGHT   POPLITEAL not palpable  not palpable   POSTERIOR TIBIAL not palpable  not palpable    DORSALIS PEDIS  ANTERIOR TIBIAL 1+ palpable  1+ palpable     Gastrointestinal: soft, nontender, BS WNL, no r/g,no palpable masses, colostomy with appliance in place RLQ.  GU: Foley catheter external tubing and bag noted.  Musculoskeletal: + generalized muscle atrophy/wasting. M/S 3/5 throughout, Extremities without ischemic changes.   Neurologic: A&O X 3; Appropriate Affect, Speech is normal CN 2-12 intact, Pain and light touch intact in extremities, Motor exam as listed above.          Non-Invasive Vascular Imaging CAROTID DUPLEX 03/14/2015   CEREBROVASCULAR DUPLEX EVALUATION    INDICATION: Follow-up carotid disease     PREVIOUS INTERVENTION(S):     DUPLEX EXAM: Technically difficult study; patient was scanned while in wheelchair     RIGHT  LEFT  Peak Systolic Velocities (cm/s) End Diastolic Velocities (cm/s) Plaque LOCATION Peak Systolic Velocities (cm/s) End Diastolic Velocities  (cm/s) Plaque  53 0  CCA PROXIMAL 59 7   48 9  CCA MID 50 11   39 11 HT CCA DISTAL 29 7 HT  42 0  ECA 26 0   35 12 CP ICA PROXIMAL 36 8 CP  149 35  ICA MID 121 39   108 29  ICA DISTAL 68 20     0.9 ICA / CCA Ratio (PSV) 1.2  Antegrade  Vertebral Flow Antegrade    Brachial Systolic Pressure (mmHg)   Within normal limits  Subclavian Artery Waveforms Within normal limits     Plaque Morphology:  HM = Homogeneous, HT = Heterogeneous, CP = Calcific Plaque, SP = Smooth Plaque, IP = Irregular Plaque     ADDITIONAL FINDINGS:     IMPRESSION: 1. Evidence of <40% stenosis of the bilateral internal carotid artery. Plaque is densely calcific and may be underestimated; however, no post stenotic turbulence is observed. 2. Velocities throughout the bilateral carotid system are low and high-resistant with the exception of the mid internal carotid artery which is tortuous. 3. Bilateral vertebral artery is antegrade.    Compared to the previous exam:  No significant change compared to prior exam.       Assessment: [name] is a 75 y.o. female who has no history of stroke or TIA.  Today's carotid duplex suggests <40% stenosis of the bilateral internal carotid artery. Plaque is densely calcific and may be underestimated; however, no post stenotic turbulence is observed. Velocities throughout the bilateral carotid system are low and high-resistant with the exception of the mid internal carotid artery which is tortuous. Bilateral vertebral artery is antegrade. No significant change compared to prior exam.  Her atherosclerotic risk factors include former smoker (quit in 2015), controlled DM, and CAD.  Her primary concern is her weakness and weight loss since she developed a rectal abscess and the sequelae to this.     Plan: Follow-up in 1 year with Carotid Duplex scan.   I discussed in depth with the patient the nature of atherosclerosis, and emphasized the importance of maximal medical  management including strict control of blood pressure, blood glucose, and lipid levels, obtaining regular exercise, and continued cessation of smoking.  The patient is aware that without maximal medical management the underlying atherosclerotic disease process will  progress, limiting the benefit of any interventions. The patient was given information about stroke prevention and what symptoms should prompt the patient to seek immediate medical care. Thank you for allowing [place] to participate in this patient's care.  Clemon Chambers, RN, MSN, FNP-C [facility] Office: [phone]  Clinic Physician: Scot Dock  03/14/2015 12:09 PM

## 2015-03-16 LAB — MISC LABCORP TEST (SEND OUT): LABCORP TEST CODE: 503770

## 2015-03-26 ENCOUNTER — Emergency Department (HOSPITAL_COMMUNITY): Payer: Commercial Managed Care - HMO

## 2015-03-26 ENCOUNTER — Encounter (HOSPITAL_COMMUNITY): Payer: Self-pay | Admitting: Emergency Medicine

## 2015-03-26 ENCOUNTER — Inpatient Hospital Stay (HOSPITAL_COMMUNITY)
Admission: EM | Admit: 2015-03-26 | Discharge: 2015-03-30 | DRG: 640 | Disposition: A | Discharge status: Skilled Nursing Facility | Payer: Commercial Managed Care - HMO | Attending: Internal Medicine | Admitting: Internal Medicine

## 2015-03-26 DIAGNOSIS — Z9049 Acquired absence of other specified parts of digestive tract: Secondary | ICD-10-CM

## 2015-03-26 DIAGNOSIS — D696 Thrombocytopenia, unspecified: Secondary | ICD-10-CM | POA: Diagnosis present

## 2015-03-26 DIAGNOSIS — Z6824 Body mass index (BMI) 24.0-24.9, adult: Secondary | ICD-10-CM

## 2015-03-26 DIAGNOSIS — L89153 Pressure ulcer of sacral region, stage 3: Secondary | ICD-10-CM | POA: Diagnosis present

## 2015-03-26 DIAGNOSIS — I252 Old myocardial infarction: Secondary | ICD-10-CM | POA: Diagnosis not present

## 2015-03-26 DIAGNOSIS — R627 Adult failure to thrive: Secondary | ICD-10-CM | POA: Diagnosis present

## 2015-03-26 DIAGNOSIS — E875 Hyperkalemia: Secondary | ICD-10-CM | POA: Diagnosis not present

## 2015-03-26 DIAGNOSIS — Z87891 Personal history of nicotine dependence: Secondary | ICD-10-CM | POA: Diagnosis not present

## 2015-03-26 DIAGNOSIS — K529 Noninfective gastroenteritis and colitis, unspecified: Secondary | ICD-10-CM | POA: Diagnosis present

## 2015-03-26 DIAGNOSIS — K5010 Crohn's disease of large intestine without complications: Secondary | ICD-10-CM | POA: Diagnosis present

## 2015-03-26 DIAGNOSIS — I679 Cerebrovascular disease, unspecified: Secondary | ICD-10-CM | POA: Diagnosis present

## 2015-03-26 DIAGNOSIS — Z932 Ileostomy status: Secondary | ICD-10-CM

## 2015-03-26 DIAGNOSIS — Z79899 Other long term (current) drug therapy: Secondary | ICD-10-CM | POA: Diagnosis not present

## 2015-03-26 DIAGNOSIS — K219 Gastro-esophageal reflux disease without esophagitis: Secondary | ICD-10-CM | POA: Diagnosis present

## 2015-03-26 DIAGNOSIS — Z86718 Personal history of other venous thrombosis and embolism: Secondary | ICD-10-CM | POA: Diagnosis not present

## 2015-03-26 DIAGNOSIS — E860 Dehydration: Secondary | ICD-10-CM | POA: Diagnosis present

## 2015-03-26 DIAGNOSIS — Z8673 Personal history of transient ischemic attack (TIA), and cerebral infarction without residual deficits: Secondary | ICD-10-CM | POA: Diagnosis not present

## 2015-03-26 DIAGNOSIS — K611 Rectal abscess: Secondary | ICD-10-CM | POA: Diagnosis present

## 2015-03-26 DIAGNOSIS — I2510 Atherosclerotic heart disease of native coronary artery without angina pectoris: Secondary | ICD-10-CM | POA: Diagnosis present

## 2015-03-26 DIAGNOSIS — K50113 Crohn's disease of large intestine with fistula: Secondary | ICD-10-CM | POA: Diagnosis not present

## 2015-03-26 DIAGNOSIS — E871 Hypo-osmolality and hyponatremia: Secondary | ICD-10-CM | POA: Diagnosis present

## 2015-03-26 DIAGNOSIS — E039 Hypothyroidism, unspecified: Secondary | ICD-10-CM | POA: Diagnosis present

## 2015-03-26 DIAGNOSIS — K604 Rectal fistula: Secondary | ICD-10-CM | POA: Diagnosis present

## 2015-03-26 DIAGNOSIS — N179 Acute kidney failure, unspecified: Secondary | ICD-10-CM | POA: Diagnosis present

## 2015-03-26 DIAGNOSIS — E119 Type 2 diabetes mellitus without complications: Secondary | ICD-10-CM | POA: Diagnosis present

## 2015-03-26 DIAGNOSIS — Z951 Presence of aortocoronary bypass graft: Secondary | ICD-10-CM | POA: Diagnosis not present

## 2015-03-26 DIAGNOSIS — L8990 Pressure ulcer of unspecified site, unspecified stage: Secondary | ICD-10-CM | POA: Diagnosis present

## 2015-03-26 DIAGNOSIS — I10 Essential (primary) hypertension: Secondary | ICD-10-CM | POA: Diagnosis present

## 2015-03-26 DIAGNOSIS — K6389 Other specified diseases of intestine: Secondary | ICD-10-CM | POA: Diagnosis not present

## 2015-03-26 DIAGNOSIS — R531 Weakness: Secondary | ICD-10-CM

## 2015-03-26 DIAGNOSIS — N390 Urinary tract infection, site not specified: Secondary | ICD-10-CM | POA: Diagnosis present

## 2015-03-26 DIAGNOSIS — E43 Unspecified severe protein-calorie malnutrition: Secondary | ICD-10-CM | POA: Diagnosis present

## 2015-03-26 DIAGNOSIS — E782 Mixed hyperlipidemia: Secondary | ICD-10-CM | POA: Diagnosis present

## 2015-03-26 LAB — COMPREHENSIVE METABOLIC PANEL
ALT: 12 U/L — ABNORMAL LOW (ref 14–54)
AST: 31 U/L (ref 15–41)
Albumin: 3 g/dL — ABNORMAL LOW (ref 3.5–5.0)
Alkaline Phosphatase: 110 U/L (ref 38–126)
Anion gap: 12 (ref 5–15)
BUN: 20 mg/dL (ref 6–20)
CO2: 16 mmol/L — ABNORMAL LOW (ref 22–32)
Calcium: 9.8 mg/dL (ref 8.9–10.3)
Chloride: 93 mmol/L — ABNORMAL LOW (ref 101–111)
Creatinine, Ser: 1.07 mg/dL — ABNORMAL HIGH (ref 0.44–1.00)
GFR calc Af Amer: 57 mL/min — ABNORMAL LOW (ref >60)
GFR calc non Af Amer: 49 mL/min — ABNORMAL LOW (ref >60)
Glucose, Bld: 129 mg/dL — ABNORMAL HIGH (ref 65–99)
Potassium: 5 mmol/L (ref 3.5–5.1)
Sodium: 121 mmol/L — ABNORMAL LOW (ref 135–145)
Total Bilirubin: 0.3 mg/dL (ref 0.3–1.2)
Total Protein: 8.1 g/dL (ref 6.5–8.1)

## 2015-03-26 LAB — CBC WITH DIFFERENTIAL/PLATELET
Basophils Absolute: 0 10*3/uL (ref 0.0–0.1)
Basophils Relative: 0 %
Eosinophils Absolute: 0 10*3/uL (ref 0.0–0.7)
Eosinophils Relative: 0 %
HCT: 40.1 % (ref 36.0–46.0)
Hemoglobin: 12.9 g/dL (ref 12.0–15.0)
Lymphocytes Relative: 11 %
Lymphs Abs: 1.1 10*3/uL (ref 0.7–4.0)
MCH: 26.4 pg (ref 26.0–34.0)
MCHC: 32.2 g/dL (ref 30.0–36.0)
MCV: 82 fL (ref 78.0–100.0)
Monocytes Absolute: 0.5 10*3/uL (ref 0.1–1.0)
Monocytes Relative: 5 %
Neutro Abs: 7.8 10*3/uL — ABNORMAL HIGH (ref 1.7–7.7)
Neutrophils Relative %: 83 %
Platelets: 209 10*3/uL (ref 150–400)
RBC: 4.89 MIL/uL (ref 3.87–5.11)
RDW: 17.2 % — ABNORMAL HIGH (ref 11.5–15.5)
WBC: 9.4 10*3/uL (ref 4.0–10.5)

## 2015-03-26 LAB — URINE MICROSCOPIC-ADD ON

## 2015-03-26 LAB — URINALYSIS, ROUTINE W REFLEX MICROSCOPIC
Bilirubin Urine: NEGATIVE
Glucose, UA: NEGATIVE mg/dL
Ketones, ur: 15 mg/dL — AB
Nitrite: POSITIVE — AB
Protein, ur: 100 mg/dL — AB
Specific Gravity, Urine: 1.016 (ref 1.005–1.030)
pH: 5.5 (ref 5.0–8.0)

## 2015-03-26 LAB — PHOSPHORUS: Phosphorus: 3.3 mg/dL (ref 2.5–4.6)

## 2015-03-26 LAB — LIPASE, BLOOD: Lipase: 71 U/L — ABNORMAL HIGH (ref 11–51)

## 2015-03-26 LAB — OSMOLALITY: OSMOLALITY: 273 mosm/kg — AB (ref 275–295)

## 2015-03-26 LAB — MAGNESIUM: MAGNESIUM: 1.6 mg/dL — AB (ref 1.7–2.4)

## 2015-03-26 MED ORDER — MESALAMINE 1.2 G PO TBEC
4.8000 g | DELAYED_RELEASE_TABLET | Freq: Every day | ORAL | Status: DC
  Administered 2015-03-26 – 2015-03-30 (×4): 4.8 g via ORAL
  Filled 2015-03-26 (×5): qty 4

## 2015-03-26 MED ORDER — ONDANSETRON HCL 4 MG PO TABS: 4.0000 mg | ORAL_TABLET | Freq: Four times a day (QID) | ORAL | Status: DC | PRN

## 2015-03-26 MED ORDER — OXYCODONE HCL 5 MG PO TABS
5.0000 mg | ORAL_TABLET | Freq: Four times a day (QID) | ORAL | Status: DC | PRN
  Administered 2015-03-26 – 2015-03-30 (×4): 5 mg via ORAL
  Filled 2015-03-26 (×6): qty 1

## 2015-03-26 MED ORDER — ACETAMINOPHEN 500 MG PO TABS: 500.0000 mg | ORAL_TABLET | ORAL | Status: DC | PRN

## 2015-03-26 MED ORDER — METOPROLOL TARTRATE 25 MG PO TABS
25.0000 mg | ORAL_TABLET | Freq: Two times a day (BID) | ORAL | Status: DC
  Administered 2015-03-26 – 2015-03-30 (×8): 25 mg via ORAL
  Filled 2015-03-26 (×8): qty 1

## 2015-03-26 MED ORDER — ROSUVASTATIN CALCIUM 40 MG PO TABS
40.0000 mg | ORAL_TABLET | Freq: Every day | ORAL | Status: DC
  Administered 2015-03-26 – 2015-03-29 (×4): 40 mg via ORAL
  Filled 2015-03-26 (×4): qty 1

## 2015-03-26 MED ORDER — COSYNTROPIN 0.25 MG IJ SOLR
0.2500 mg | Freq: Once | INTRAMUSCULAR | Status: AC
  Administered 2015-03-27: 0.25 mg via INTRAVENOUS
  Filled 2015-03-26: qty 0.25

## 2015-03-26 MED ORDER — ONDANSETRON HCL 4 MG/2ML IJ SOLN: 4.0000 mg | Freq: Four times a day (QID) | INTRAMUSCULAR | Status: DC | PRN

## 2015-03-26 MED ORDER — DEXTROSE 5 % IV SOLN
2.0000 g | INTRAVENOUS | Status: DC
  Administered 2015-03-26 – 2015-03-29 (×4): 2 g via INTRAVENOUS
  Filled 2015-03-26 (×5): qty 2

## 2015-03-26 MED ORDER — ALPRAZOLAM 0.5 MG PO TABS
0.5000 mg | ORAL_TABLET | Freq: Two times a day (BID) | ORAL | Status: DC | PRN
  Administered 2015-03-26 – 2015-03-30 (×3): 0.5 mg via ORAL
  Filled 2015-03-26 (×3): qty 1

## 2015-03-26 MED ORDER — SODIUM CHLORIDE 0.9 % IV BOLUS (SEPSIS)
1000.0000 mL | Freq: Once | INTRAVENOUS | Status: AC
  Administered 2015-03-26: 1000 mL via INTRAVENOUS

## 2015-03-26 MED ORDER — LAMOTRIGINE 25 MG PO TABS
25.0000 mg | ORAL_TABLET | Freq: Every day | ORAL | Status: DC
  Administered 2015-03-27 – 2015-03-30 (×5): 25 mg via ORAL
  Filled 2015-03-26 (×6): qty 1

## 2015-03-26 MED ORDER — METRONIDAZOLE IN NACL 5-0.79 MG/ML-% IV SOLN
500.0000 mg | Freq: Three times a day (TID) | INTRAVENOUS | Status: DC
  Administered 2015-03-27 – 2015-03-28 (×5): 500 mg via INTRAVENOUS
  Filled 2015-03-26 (×5): qty 100

## 2015-03-26 MED ORDER — MIRTAZAPINE 15 MG PO TABS
15.0000 mg | ORAL_TABLET | Freq: Every day | ORAL | Status: DC
  Administered 2015-03-26 – 2015-03-29 (×4): 15 mg via ORAL
  Filled 2015-03-26 (×4): qty 1

## 2015-03-26 MED ORDER — SERTRALINE HCL 100 MG PO TABS
100.0000 mg | ORAL_TABLET | Freq: Every day | ORAL | Status: DC
  Administered 2015-03-27 – 2015-03-30 (×5): 100 mg via ORAL
  Filled 2015-03-26 (×6): qty 1

## 2015-03-26 MED ORDER — SODIUM CHLORIDE 0.9 % IV SOLN
INTRAVENOUS | Status: DC
  Administered 2015-03-26 – 2015-03-30 (×4): via INTRAVENOUS

## 2015-03-26 MED ORDER — SODIUM CHLORIDE 0.9% FLUSH
3.0000 mL | Freq: Two times a day (BID) | INTRAVENOUS | Status: DC
  Administered 2015-03-27 – 2015-03-28 (×2): 3 mL via INTRAVENOUS

## 2015-03-26 NOTE — ED Notes (Signed)
Gave pt an oral swab, per Melissa - RN.

## 2015-03-26 NOTE — ED Notes (Addendum)
When inserting new foley catheter wtith Tanya, EMT the pt complained of moderate pain and discomfort in perineal area. Upon inflation of the bladder balloon, pus like discharge flowed out of the vagina. EDP Yao aware of situation and was present at bedside during and stated to continue with insertion. Balloon was inflated with a small amount of urine return. Pt ostomy bag also changed at this time. This RN noted large amounts of redness to inner thighs and perineal area, also around stoma. WHen turning the pt, this RN and Hope, RN noted a 3cm stage 2 pressure ulcer to sacral area. Dressing applied to area and family updated. Pt tolerated all procedures well.

## 2015-03-26 NOTE — ED Notes (Signed)
Pt here from [facility] rehab facility. Pt was at [place] office today for visit for eval of weakness and RLQ abd pain that pt sts is from her crohns. Pt family reports that pt has had no appetite for several weeks at the rehab facility and "they are not taking good care of her." Pt family wants to seek different placement for pt.

## 2015-03-26 NOTE — ED Provider Notes (Signed)
CSN: [id number]     Arrival date & time 03/26/15  1430 History   First MD Initiated Contact with Patient 03/26/15 1433     Chief Complaint  Patient presents with  . Failure To Thrive  . Weakness  . Abdominal Pain    HPI   75 year old female with an extensive past medical history presents today with weakness, abdominal pain. Patient currently lives at [facility] rehabilitation facility. Patient has a long-standing history of colitis. She was admitted in June for ulcerative colitis flare. She was discharged home and readmitted the following day for perirectal abscess and fasciitis. She had I&D on 07/24/2014, she was also noted to have a DVT at that time but due to bleeding was not candidate for anticoagulation, resulting in IVC filter placed on 713. Due to ongoing rectal bleeding and continued flare of her ulcerative colitis she underwent ileostomy on 727, readmitted on 812 with a GI bleed and chest pain. She was again admitted to the hospital and 823 through 8:30 with acute ulcerative colitis with rectal bleeding, she had a CT scan of the abdomen showed pancolitis, biopsy showed acute colitis.  Patient was seen by gastroenterology on 02/01/2015 [facility] gastroenterology Dr. Lorina Rabon)  with complaints of stool per rectum and possibly output through the post I&D site. She had a MRI pelvis w/wo contrast showing lateral transsphincteric perineal fistula which courses post appearing inferiorly. Exit into the gluteal crease no associated abscess noted.   Patient reports that since being discharged from the hospital, she has been persistently weak to the point where she cannot ambulate anymore. She has a decreased appetite, with low by mouth intake. Patient reports that approximately 3 days ago she developed abdominal pain, described as sharp. Patient denies any change in her ileostomy output, but notes that she has not been paying close attention. She denies any fever, chills, nausea, vomiting. Patient  does note that she's had a  pain to her rectum for the last week.    Past Medical History  Diagnosis Date  . CAD (coronary artery disease)     unspecified  . Cerebrovascular disease 2010    bil carotid artery disease. left CEA 02/2004. 02/2014 carotid ultrasound: < 40% bil ICA disease.   Marland Kitchen Hyperlipidemia, mixed   . HTN (hypertension)   . Ulcerative colitis 2002  . Colon polyps 2006, 2014  . Thyroid disease   . Myocardial infarct ([place]) 1998  . Diabetes mellitus without complication (HCC)     Borderline  . GERD (gastroesophageal reflux disease)   . Arthritis   . 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   . Depression 10/12/2014  . DVT (deep venous thrombosis) ([place]) 08/29/14    left LE, s/p 08/30/14 IVC filter.    Past Surgical History  Procedure Laterality Date  . Cholecystectomy  7.06.2008    with cholangiogram  . 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 Abdominal hysterectomy    . Colonoscopy  multiple  . Sigmoidoscopy  multiple  . Flexible sigmoidoscopy N/A 08/08/2014    Procedure: FLEXIBLE SIGMOIDOSCOPY;  Surgeon: Gatha Mayer, MD;  Location: [place];  Service: Endoscopy;  Laterality: N/A;  . Incision and drainage perirectal abscess N/A 08/22/2014    Procedure: IRRIGATION AND DEBRIDEMENT PERIRECTAL ABSCESS;  Surgeon: Jackolyn Confer, MD;  [facility];  Service: General;  Laterality: N/A;  . Joint replacement  left knee  . Flexible sigmoidoscopy N/A 09/06/2014    Procedure: FLEXIBLE SIGMOIDOSCOPY;  Surgeon: Lafayette Dragon, MD;  [facility];  Service: Endoscopy;  Laterality: N/A;  . Laparoscopic diverted colostomy N/A 09/13/2014    Procedure: LAPAROSCOPIC DIVERTED ILEOSTOMY;  Surgeon: Stark Klein, MD;  Location[facility];  Service: General;  Laterality: N/A;  . Rectal exam under anesthesia N/A 09/13/2014    Procedure: RECTAL EXAM UNDER ANESTHESIA  AND DEBRIDEMENT OF PERIANAL WOUND;  Surgeon: Stark Klein, MD;  [facility];  Service: General;  Laterality:  N/A;  . Flexible sigmoidoscopy N/A 10/04/2014    Procedure: Otho Darner SIGMOIDOSCOPY;  Surgeon: Inda Castle, MD;  Location: [place];  Service: Endoscopy;  Laterality: N/A;  . Flexible sigmoidoscopy N/A 10/11/2014    Procedure: FLEXIBLE SIGMOIDOSCOPY;  Surgeon: Irene Shipper, MD;  Location: [place];  Service: Endoscopy;  Laterality: N/A;   Family History  Problem Relation Age of Onset  . Colitis Brother     1 bro with Crohn's, another bro with unspecified colitis.   . Stomach cancer Mother   . Stroke Father     died  . Colon cancer Neg Hx    Social History  Substance Use Topics  . Smoking status: Former Smoker -- 0.30 packs/day for 40 years    Types: Cigarettes    Quit date: 02/17/2013  . Smokeless tobacco: Never Used  . Alcohol Use: No   OB History    No data available     Review of Systems  All other systems reviewed and are negative.   Allergies  Clindamycin/lincomycin and Ivp dye  Home Medications   Prior to Admission medications   Medication Sig Start Date End Date Taking? Authorizing Provider  ALPRAZolam Duanne Moron) 0.5 MG tablet Take 1 tablet (0.5 mg total) by mouth 2 (two) times daily as needed for anxiety. 11/21/14  Yes Lauree Chandler, NP  CRESTOR 40 MG tablet TAKE 1 TABLET AT BEDTIME 05/17/14  Yes Lelon Perla, MD  lamoTRIgine (LAMICTAL) 25 MG tablet Take 25 mg by mouth daily.   Yes Historical Provider, MD  LIALDA 1.2 G EC tablet Take 4.8 g by mouth daily. 08/01/14  Yes Historical Provider, MD  metoprolol tartrate (LOPRESSOR) 25 MG tablet Take 1 tablet by mouth 2 (two) times daily. 08/16/14  Yes Historical Provider, MD  mirtazapine (REMERON) 15 MG tablet Take 1 tablet (15 mg total) by mouth at bedtime. 10/17/14  Yes Eugenie Filler, MD  ondansetron (ZOFRAN) 4 MG tablet Take 1 tablet (4 mg total) by mouth every 6 (six) hours as needed for nausea. 10/17/14  Yes Eugenie Filler, MD  oxyCODONE (OXY IR/ROXICODONE) 5 MG immediate release tablet Take one  tablet by mouth every six hours for pain 02/05/15  Yes Tiffany L Reed, DO  pantoprazole (PROTONIX) 40 MG tablet Take 1 tablet (40 mg total) by mouth daily at 6 (six) AM. 10/17/14  Yes Eugenie Filler, MD  sertraline (ZOLOFT) 100 MG tablet Take 1 tablet (100 mg total) by mouth daily. 10/17/14  Yes Eugenie Filler, MD  acetaminophen (TYLENOL) 500 MG tablet Take 1 tablet (500 mg total) by mouth every 4 (four) hours as needed for mild pain or moderate pain. Patient taking differently: Take 1,000 mg by mouth every 4 (four) hours as needed for mild pain or moderate pain.  10/17/14   Eugenie Filler, MD  metroNIDAZOLE (FLAGYL) 500 MG tablet Take 500 mg by mouth 3 (three) times daily.    Historical Provider, MD   BP 100/66 mmHg  Pulse 81  Resp 17  SpO2 97%  Physical Exam  Constitutional: She is oriented to person, place, and time. She appears well-developed.  HENT:  Head: Normocephalic and atraumatic.  Eyes: Conjunctivae are normal. Pupils are equal, round, and reactive to light. Right eye exhibits no discharge. Left eye exhibits no discharge. No scleral icterus.  Neck: Normal range of motion. No JVD present. No tracheal deviation present.  Cardiovascular: Normal rate, regular rhythm and intact distal pulses.  Exam reveals friction rub. Exam reveals no gallop.   No murmur heard. Pulmonary/Chest: Effort normal. No stridor.  Abdominal: She exhibits no distension and no mass. There is tenderness. There is no rebound and no guarding.  Ileostomy patient with green output   Musculoskeletal: Normal range of motion. She exhibits no edema or tenderness.  Neurological: She is alert and oriented to person, place, and time. Coordination normal.  Skin: Skin is warm and dry. No rash noted. No erythema. No pallor.  Psychiatric: She has a normal mood and affect. Her behavior is normal. Judgment and thought content normal.  Nursing note and vitals reviewed.   ED Course  Procedures (including critical  care time) Labs Review Labs Reviewed  CBC WITH DIFFERENTIAL/PLATELET  COMPREHENSIVE METABOLIC PANEL  LIPASE, BLOOD  URINALYSIS, ROUTINE W REFLEX MICROSCOPIC (NOT AT [facility])    Imaging Review No results found. I have personally reviewed and evaluated these images and lab results as part of my medical decision-making.   EKG Interpretation None      MDM   Final diagnoses:  Weakness    Labs: CBC, CMP, lipase, urinalysis  Imaging:  Consults:  Therapeutics:  Discharge Meds:   Assessment/Plan: Pt care signed out to oncoming provider pending labs, imaging, and further evaluation. No information available at the time of care transfer.        Okey Regal, PA-C 03/26/15 1603  Carmin Muskrat, MD 03/31/15 [phone]

## 2015-03-26 NOTE — ED Provider Notes (Signed)
Assumed care at 4pm from [facility], PA-C. Please refer to his H&P. Pt is 74yoF here with multiple complaints. She has an extensive GI history and is here for lower abdominal pain, diffuse weakness, and concerns for Failure to thrive. Found to be hyponatremic. Patient will be admitted to medicine for further management and evaluation. CT abdomen pelvis pending  Heriberto Antigua, MD 03/27/15 [id number]  Wandra Arthurs, MD 03/27/15 1556

## 2015-03-26 NOTE — Progress Notes (Signed)
Lab at pts. Bedside. Unable to obtain enough blood for scheduled lab draw after multiple attempts. Text page sent to on call NP, K. Baltazar Najjar, [facility]) via text page.

## 2015-03-26 NOTE — H&P (Signed)
[facility] History and Physical  [name] [id number] DOB: Aug 31, 1940 DOA: 03/26/2015  Referring physician: Heriberto Antigua, MD PCP: Kandice Hams, MD   Chief Complaint: Weakness and abdominal pain.  HPI: [name] is a 75 y.o. female with a past medical history of Crohn's disease, admitted multiple times last year between June and August due to colitis exacerbation and underwent I&D for rectal abscess and fistula, underwent ileostomy on 09/13/2014, readmitted on 09/29/2014 and 10/10/2014 for GI bleed, CAD, CABG, CVA, hyperlipidemia, hypertension, hypothyroidism, GERD, depression, DVT, status post IVC filter  placement on 08/30/2014 who is currently a resident at [facility] rehabilitation facility and is coming to the emergency department for above complaints after following with Dr. Barry Dienes at her office today.  Her daughter states that for the past 2 weeks the patient has been feeling progressively weaker, has decreased appetite and persistent chills. She complains of rectum area pain since last week. She denies fever, night sweats or rigors, but complains of severe fatigue and inability to ambulate by herself. She has had sharp abdominal pain for the past 3 days associated with mild nausea, no increase in output of fluid, but increase gas has been noticed in the ileostomy bag.  She also has had mild abdominal distention. She denies chest pain, palpitations, dizziness, diaphoresis, pitting edema lower extremities.   Workup in the emergency department shows  hyponatremia 121 mmol/L, mild elevation in lipase, mild hypomagnesemia. CT scan of the abdomen does not show any acute abnormalities. When seen, the patient was in no acute distress, but looks chronically ill.   Review of Systems:  Constitutional:  Positive chills, fatigue.  No weight loss, night sweats, Fevers.  HEENT:  No headaches, difficulty swallowing,Tooth/dental problems,Sore throat,  No sneezing, itching, ear  ache, nasal congestion, post nasal drip,  Cardio-vascular:  No chest pain, Orthopnea, PND, swelling in lower extremities, anasarca, dizziness, palpitations  GI:  Positive abdominal pain, nausea, change in bowel habits, loss of appetite.  No heartburn, indigestion, vomiting, diarrhea.  Resp: denies dyspnea, productive cough, wheezing or hemoptysis. Skin:  no rash or lesions.  GU:  Mild dysuria, change in color of urine. No urgency or frequency. No flank pain.  Musculoskeletal:  No joint pain or swelling. No decreased range of motion. No back pain.  Psych:  No change in mood or affect. No depression or anxiety. No memory loss.   Past Medical History  Diagnosis Date  . CAD (coronary artery disease)     unspecified  . Cerebrovascular disease 2010    bil carotid artery disease. left CEA 02/2004. 02/2014 carotid ultrasound: < 40% bil ICA disease.   Marland Kitchen Hyperlipidemia, mixed   . HTN (hypertension)   . Ulcerative colitis 2002  . Colon polyps 2006, 2014  . Thyroid disease   . Myocardial infarct ([place]) 1998  . Diabetes mellitus without complication (HCC)     Borderline  . GERD (gastroesophageal reflux disease)   . Arthritis   . 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   . Depression 10/12/2014  . DVT (deep venous thrombosis) ([place]) 08/29/14    left LE, s/p 08/30/14 IVC filter.    Past Surgical History  Procedure Laterality Date  . Cholecystectomy  7.06.2008    with cholangiogram  . 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 Abdominal hysterectomy    . Colonoscopy  multiple  . Sigmoidoscopy  multiple  . Flexible sigmoidoscopy N/A 08/08/2014    Procedure: FLEXIBLE SIGMOIDOSCOPY;  Surgeon: Ofilia Neas  Carlean Purl, MD;  Location: [place] [facility];  Service: Endoscopy;  Laterality: N/A;  . Incision and drainage perirectal abscess N/A 08/22/2014    Procedure: IRRIGATION AND DEBRIDEMENT PERIRECTAL ABSCESS;  Surgeon: Jackolyn Confer, MD;  Location: [facility];  Service: General;  Laterality: N/A;  . Joint  replacement  left knee  . Flexible sigmoidoscopy N/A 09/06/2014    Procedure: FLEXIBLE SIGMOIDOSCOPY;  Surgeon: Lafayette Dragon, MD;  Location: [facility];  Service: Endoscopy;  Laterality: N/A;  . Laparoscopic diverted colostomy N/A 09/13/2014    Procedure: LAPAROSCOPIC DIVERTED ILEOSTOMY;  Surgeon: Stark Klein, MD;  Location: [facility];  Service: General;  Laterality: N/A;  . Rectal exam under anesthesia N/A 09/13/2014    Procedure: RECTAL EXAM UNDER ANESTHESIA  AND DEBRIDEMENT OF PERIANAL WOUND;  Surgeon: Stark Klein, MD;  Location: [facility];  Service: General;  Laterality: N/A;  . Flexible sigmoidoscopy N/A 10/04/2014    Procedure: Otho Darner SIGMOIDOSCOPY;  Surgeon: Inda Castle, MD;  Location: [facility];  Service: Endoscopy;  Laterality: N/A;  . Flexible sigmoidoscopy N/A 10/11/2014    Procedure: FLEXIBLE SIGMOIDOSCOPY;  Surgeon: Irene Shipper, MD;  Location: [facility];  Service: Endoscopy;  Laterality: N/A;   Social History:  reports that she quit smoking about 2 years ago. Her smoking use included Cigarettes. She has a 12 pack-year smoking history. She has never used smokeless tobacco. She reports that she does not drink alcohol or use illicit drugs.  Allergies  Allergen Reactions  . Clindamycin/Lincomycin Diarrhea    Leads to colitis flare  . Ivp Dye [Iodinated Diagnostic Agents] Other (See Comments)    "almost passed out"    Family History  Problem Relation Age of Onset  . Colitis Brother     1 bro with Crohn's, another bro with unspecified colitis.   . Stomach cancer Mother   . Stroke Father     died  . Colon cancer Neg Hx     Prior to Admission medications   Medication Sig Start Date End Date Taking? Authorizing Provider  ALPRAZolam Duanne Moron) 0.5 MG tablet Take 1 tablet (0.5 mg total) by mouth 2 (two) times daily as needed for anxiety. 11/21/14  Yes Lauree Chandler, NP  CRESTOR 40 MG tablet TAKE 1 TABLET AT BEDTIME 05/17/14  Yes Lelon Perla, MD  lamoTRIgine (LAMICTAL)  25 MG tablet Take 25 mg by mouth daily.   Yes Historical Provider, MD  LIALDA 1.2 G EC tablet Take 4.8 g by mouth daily. 08/01/14  Yes Historical Provider, MD  metoprolol tartrate (LOPRESSOR) 25 MG tablet Take 1 tablet by mouth 2 (two) times daily. 08/16/14  Yes Historical Provider, MD  mirtazapine (REMERON) 15 MG tablet Take 1 tablet (15 mg total) by mouth at bedtime. 10/17/14  Yes Eugenie Filler, MD  ondansetron (ZOFRAN) 4 MG tablet Take 1 tablet (4 mg total) by mouth every 6 (six) hours as needed for nausea. 10/17/14  Yes Eugenie Filler, MD  oxyCODONE (OXY IR/ROXICODONE) 5 MG immediate release tablet Take one tablet by mouth every six hours for pain 02/05/15  Yes Tiffany L Reed, DO  pantoprazole (PROTONIX) 40 MG tablet Take 1 tablet (40 mg total) by mouth daily at 6 (six) AM. 10/17/14  Yes Eugenie Filler, MD  sertraline (ZOLOFT) 100 MG tablet Take 1 tablet (100 mg total) by mouth daily. 10/17/14  Yes Eugenie Filler, MD  acetaminophen (TYLENOL) 500 MG tablet Take 1 tablet (500 mg total) by mouth every 4 (four) hours as needed for  mild pain or moderate pain. Patient taking differently: Take 1,000 mg by mouth every 4 (four) hours as needed for mild pain or moderate pain.  10/17/14   Eugenie Filler, MD  metroNIDAZOLE (FLAGYL) 500 MG tablet Take 500 mg by mouth 3 (three) times daily.    Historical Provider, MD   Physical Exam: Filed Vitals:   03/26/15 1430 03/26/15 1439 03/26/15 1737  BP:  100/66 112/69  Pulse:  81 86  TempSrc:   Oral  Resp:  17 18  SpO2: 97% 97% 97%    Wt Readings from Last 3 Encounters:  03/14/15 61.236 kg (135 lb)  03/09/15 77.707 kg (171 lb 5 oz)  01/02/15 89.812 kg (198 lb)    General:  Appears calm and comfortable, looks chronically ill.  Eyes: PERRL, normal lids, irises & conjunctiva ENT: grossly normal hearing, lips and oral mucosa mildly dry. Neck: no LAD, masses or thyromegaly Cardiovascular: RRR, no m/r/g. No LE edema. Telemetry: SR, occasional  PVCs. Respiratory: CTA bilaterally, no w/r/r. Normal respiratory effort. Abdomen: Ileostomy and ileostomy bag in place, soft, positive epigastric tenderness,                               no guarding, no rebound tenderness. Skin: no rash or induration seen on limited exam Musculoskeletal: grossly normal tone BUE/BLE Psychiatric: grossly normal mood and affect, speech fluent and appropriate Neurologic: Awake, alert, oriented 2, grossly non-focal.          Labs on Admission:  Basic Metabolic Panel:  Recent Labs Lab 03/26/15 1609  NA 121*  K 5.0  CL 93*  CO2 16*  GLUCOSE 129*  BUN 20  CREATININE 1.07*  CALCIUM 9.8   Liver Function Tests:  Recent Labs Lab 03/26/15 1609  AST 31  ALT 12*  ALKPHOS 110  BILITOT 0.3  PROT 8.1  ALBUMIN 3.0*    Recent Labs Lab 03/26/15 1609  LIPASE 71*   CBC:  Recent Labs Lab 03/26/15 1609  WBC 9.4  NEUTROABS 7.8*  HGB 12.9  HCT 40.1  MCV 82.0  PLT 209    Radiological Exams on Admission: Ct Abdomen Pelvis Wo Contrast  03/26/2015  CLINICAL DATA:  Generalized abdominal pain, weakness, loss of appetite. EXAM: CT ABDOMEN AND PELVIS WITHOUT CONTRAST TECHNIQUE: Multidetector CT imaging of the abdomen and pelvis was performed following the standard protocol without IV contrast. COMPARISON:  10/10/2014 FINDINGS: Minimal dependent atelectasis in the lung bases. No effusions. Prior CABG. Heart is normal size. Prior cholecystectomy. Liver, spleen, pancreas, adrenals are unremarkable. Punctate nonobstructing left lower pole renal stone. Small low-density areas within the kidneys bilaterally. No hydronephrosis. Aorta and iliac vessels are densely calcified. No aneurysm. IVC filter noted within the infrarenal IVC. Foley catheter is present in the bladder which is decompressed. Multiple calcifications adjacent to the Foley balloon, likely bladder stones. Right lower quadrant ileostomy, stable. Stomach and large bowel grossly no free fluid, free air or  adenopathy. No acute bony abnormality or focal bone lesion. IMPRESSION: No acute findings in the abdomen or pelvis. Heavily calcified aorta and branch vessels.  No aneurysm. Right lower quadrant ileostomy, stable. IVC filter placed. Electronically Signed   By: Rolm Baptise M.D.   On: 03/26/2015 18:16     Assessment/Plan Principal Problem:   Hyponatremia Admit to telemetry. Continue IV hydration with normal saline. Monitor sodium level closely. Check serum osmolality. Check urine random sodium level and urine osmolality. Check TSH, ACTH,  a.m. cortisol level.  Active Problems:   General weakness This is likely due to physical deconditioning and hyponatremia. Continue sodium replacement with normal saline. PT/OT evaluation.       Rectal fistula   Pressure ulcer   Perirectal abscess Start Rocephin and Flagyl IV. Consult wound care. Dr. Barry Dienes is the primary general surgeon for this case.     Inflammatory bowel disease CT scan of the abdomen does not show any signs of colitis at this time.  Continue Remicade as scheduled every 8 weeks. Consider GI evaluation.      Essential hypertension Continue metoprolol tartrate 25 mg by mouth twice a day.    Hypomagnesemia Replacement with magnesium sulfate.   Social services evaluation was requested,  since family members would like placement to a different skilled nursing facility.    Code Status: Full code. DVT Prophylaxis: SCDs. Family Communication: Her daughter Curly Shores was present in the room. Disposition Plan: Admit for IV hydration, sodium replacement, IV antibiotics, wound care and social                             services evaluation.   Time spent: Over 70 minutes were spent in the process of this admission.   Reubin Milan, M.D. [facility] Pager [phone].

## 2015-03-27 LAB — COMPREHENSIVE METABOLIC PANEL
ALT: 12 U/L — ABNORMAL LOW (ref 14–54)
AST: 28 U/L (ref 15–41)
Albumin: 2.5 g/dL — ABNORMAL LOW (ref 3.5–5.0)
Alkaline Phosphatase: 91 U/L (ref 38–126)
Anion gap: 12 (ref 5–15)
BUN: 12 mg/dL (ref 6–20)
CHLORIDE: 103 mmol/L (ref 101–111)
CO2: 16 mmol/L — ABNORMAL LOW (ref 22–32)
CREATININE: 0.66 mg/dL (ref 0.44–1.00)
Calcium: 9 mg/dL (ref 8.9–10.3)
Glucose, Bld: 80 mg/dL (ref 65–99)
POTASSIUM: 4 mmol/L (ref 3.5–5.1)
Sodium: 131 mmol/L — ABNORMAL LOW (ref 135–145)
Total Bilirubin: 0.2 mg/dL — ABNORMAL LOW (ref 0.3–1.2)
Total Protein: 7.3 g/dL (ref 6.5–8.1)

## 2015-03-27 LAB — CBC WITH DIFFERENTIAL/PLATELET
BASOS ABS: 0 10*3/uL (ref 0.0–0.1)
Basophils Relative: 0 %
EOS PCT: 1 %
Eosinophils Absolute: 0.1 10*3/uL (ref 0.0–0.7)
HCT: 37.2 % (ref 36.0–46.0)
Hemoglobin: 11.9 g/dL — ABNORMAL LOW (ref 12.0–15.0)
LYMPHS PCT: 16 %
Lymphs Abs: 1.3 10*3/uL (ref 0.7–4.0)
MCH: 26.1 pg (ref 26.0–34.0)
MCHC: 32 g/dL (ref 30.0–36.0)
MCV: 81.6 fL (ref 78.0–100.0)
Monocytes Absolute: 0.6 10*3/uL (ref 0.1–1.0)
Monocytes Relative: 8 %
NEUTROS ABS: 5.9 10*3/uL (ref 1.7–7.7)
Neutrophils Relative %: 75 %
PLATELETS: 204 10*3/uL (ref 150–400)
RBC: 4.56 MIL/uL (ref 3.87–5.11)
RDW: 17.4 % — ABNORMAL HIGH (ref 11.5–15.5)
WBC: 7.9 10*3/uL (ref 4.0–10.5)

## 2015-03-27 LAB — TSH: TSH: 0.617 u[IU]/mL (ref 0.350–4.500)

## 2015-03-27 LAB — CORTISOL: CORTISOL PLASMA: 16.5 ug/dL

## 2015-03-27 LAB — LIPASE, BLOOD: LIPASE: 43 U/L (ref 11–51)

## 2015-03-27 LAB — MRSA PCR SCREENING: MRSA BY PCR: NEGATIVE

## 2015-03-27 MED ORDER — PNEUMOCOCCAL VAC POLYVALENT 25 MCG/0.5ML IJ INJ
0.5000 mL | INJECTION | INTRAMUSCULAR | Status: DC
  Filled 2015-03-27: qty 0.5

## 2015-03-27 MED ORDER — ENSURE ENLIVE PO LIQD
237.0000 mL | Freq: Two times a day (BID) | ORAL | Status: DC
  Administered 2015-03-29 – 2015-03-30 (×2): 237 mL via ORAL

## 2015-03-27 MED ORDER — COLLAGENASE 250 UNIT/GM EX OINT
TOPICAL_OINTMENT | Freq: Every day | CUTANEOUS | Status: DC
  Administered 2015-03-27 – 2015-03-30 (×4): via TOPICAL
  Filled 2015-03-27: qty 30

## 2015-03-27 MED ORDER — MAGNESIUM SULFATE 2 GM/50ML IV SOLN
2.0000 g | Freq: Once | INTRAVENOUS | Status: AC
  Administered 2015-03-27: 2 g via INTRAVENOUS
  Filled 2015-03-27: qty 50

## 2015-03-27 MED ORDER — INFLUENZA VAC SPLIT QUAD 0.5 ML IM SUSY
0.5000 mL | PREFILLED_SYRINGE | INTRAMUSCULAR | Status: DC
  Filled 2015-03-27: qty 0.5

## 2015-03-27 NOTE — Progress Notes (Signed)
Utilization review completed. Rhealyn Cullen, RN, BSN. 

## 2015-03-27 NOTE — NC FL2 (Signed)
Long LEVEL OF CARE SCREENING TOOL     IDENTIFICATION  Patient Name: [name] Birthdate: [birth year] Sex: female Admission Date (Current Location): 03/26/2015  [facility] and [place] Number:  Herbalist and Address:  [facility]. [facility], [place] [address] [place],  [address]      Provider Number:    Attending Physician Name and Address:  Mendel Corning, MD  Relative Name and Phone Number:   (Ramona (Daughter))    Current Level of Care: Hospital Recommended Level of Care: [place] Prior Approval Number:    Date Approved/Denied:   PASRR Number:  [id number]  Discharge Plan: SNF    Current Diagnoses: Patient Active Problem List   Diagnosis Date Noted  . Hypomagnesemia 03/27/2015  . Perirectal abscess   . Protein-calorie malnutrition, severe ([place]) 10/12/2014  . Depression 10/12/2014  . Pancolitis ([place])   . UTI (lower urinary tract infection) 10/10/2014  . Pressure ulcer 10/05/2014  . Inflammatory bowel disease   . Malnutrition of moderate degree ([place]) 09/30/2014  . Rectal bleeding 09/29/2014  . GI bleed 09/29/2014  . Elevated lactic acid level   . Rectal fistula   . Fasciitis   . Cold   . DVT (deep venous thrombosis) ([place])   . Acute ulcerative colitis with rectal bleeding ([place])   . Abscess, perirectal s/p I&D 08/23/2014 08/27/2014  . Sepsis ([place]) 08/26/2014  . Infection due to yeast 08/26/2014  . Acute blood loss anemia 08/26/2014  . Leukocytosis 08/26/2014  . Hypokalemia 08/26/2014  . Hyponatremia 08/26/2014  . General weakness 08/26/2014  . Thrombocytopenia ([place]) 08/26/2014  . Essential hypertension 07/11/2008  . Inflammatory bowel disease (IBD) with colitis 07/11/2008    Orientation RESPIRATION BLADDER Height & Weight     Self, Time, Situation, Place  Normal Continent Weight: 161 lb 6.4 oz (73.211 kg) (bed scale) Height:     BEHAVIORAL SYMPTOMS/MOOD NEUROLOGICAL BOWEL NUTRITION STATUS      Continent    AMBULATORY STATUS COMMUNICATION OF NEEDS Skin   Limited Assist Verbally Normal                       Personal Care Assistance Level of Assistance  Bathing, Feeding, Dressing Bathing Assistance: Limited assistance Feeding assistance: Limited assistance Dressing Assistance: Limited assistance     Functional Limitations Info  Sight, Hearing, Speech Sight Info: Adequate Hearing Info: Adequate Speech Info: Adequate    SPECIAL CARE FACTORS FREQUENCY  PT (By licensed PT)     PT Frequency: 5x              Contractures      Additional Factors Info  Code Status, Allergies Code Status Info: full Allergies Info: clindamycin           Current Medications (03/27/2015):  This is the current hospital active medication list Current Facility-Administered Medications  Medication Dose Route Frequency Provider Last Rate Last Dose  . 0.9 %  sodium chloride infusion   Intravenous Continuous Reubin Milan, MD 100 mL/hr at 03/26/15 2251    . acetaminophen (TYLENOL) tablet 500 mg  500 mg Oral Q4H PRN Reubin Milan, MD      . ALPRAZolam Duanne Moron) tablet 0.5 mg  0.5 mg Oral BID PRN Reubin Milan, MD   0.5 mg at 03/26/15 2250  . cefTRIAXone (ROCEPHIN) 2 g in dextrose 5 % 50 mL IVPB  2 g Intravenous Q24H Reubin Milan, MD   2  g at 03/26/15 2249  . collagenase (SANTYL) ointment   Topical Daily Ripudeep K Rai, MD      . lamoTRIgine (LAMICTAL) tablet 25 mg  25 mg Oral Daily Reubin Milan, MD   25 mg at 03/27/15 1128  . mesalamine (LIALDA) EC tablet 4.8 g  4.8 g Oral Daily Reubin Milan, MD   4.8 g at 03/27/15 1128  . metoprolol tartrate (LOPRESSOR) tablet 25 mg  25 mg Oral BID Reubin Milan, MD   25 mg at 03/27/15 1128  . metroNIDAZOLE (FLAGYL) IVPB 500 mg  500 mg Intravenous Q8H Reubin Milan, MD   500 mg at 03/27/15 1400  . mirtazapine (REMERON) tablet 15 mg  15 mg Oral QHS Reubin Milan, MD   15 mg at 03/26/15 2249  . ondansetron  (ZOFRAN) tablet 4 mg  4 mg Oral Q6H PRN Reubin Milan, MD       Or  . ondansetron [facility]) injection 4 mg  4 mg Intravenous Q6H PRN Reubin Milan, MD      . oxyCODONE (Oxy IR/ROXICODONE) immediate release tablet 5 mg  5 mg Oral Q6H PRN Reubin Milan, MD   5 mg at 03/26/15 2250  . rosuvastatin (CRESTOR) tablet 40 mg  40 mg Oral QHS Reubin Milan, MD   40 mg at 03/26/15 2249  . sertraline (ZOLOFT) tablet 100 mg  100 mg Oral Daily Reubin Milan, MD   100 mg at 03/27/15 1127  . sodium chloride flush (NS) 0.9 % injection 3 mL  3 mL Intravenous Q12H Reubin Milan, MD   3 mL at 03/27/15 0109     Discharge Medications: Please see discharge summary for a list of discharge medications.  Relevant Imaging Results:  Relevant Lab Results:   Additional Information SS# [id number]  [place] intern  [phone]

## 2015-03-27 NOTE — Evaluation (Signed)
Physical Therapy Evaluation Patient Details Name: [name] MRN: [id number] DOB: Oct 14, 1940 Today's Date: 03/27/2015   History of Present Illness  [name] is a 75 y.o. female with a past medical history of Crohn's disease, admitted multiple times last year between June and August due to colitis exacerbation and underwent I&D for rectal abscess and fistula, underwent ileostomy on 09/13/2014, readmitted on 09/29/2014 and 10/10/2014 for GI bleed, CAD, CABG, CVA, hyperlipidemia, hypertension, hypothyroidism, GERD, depression, DVT, status post IVC filter placement on 08/30/2014 .  Patient admitted due to weakness and abdominal pain.  Clinical Impression  Patient presents with dependencies for mobility (unsure if baseline,) but feel could benefit from skilled PT in the acute setting to decrease burden of care.  Feel SNF level rehab at d/c also could improve mobility and worth exploring in that setting.      Follow Up Recommendations SNF;Supervision/Assistance - 24 hour    Equipment Recommendations  None recommended by PT    Recommendations for Other Services       Precautions / Restrictions Precautions Precautions: Fall      Mobility  Bed Mobility Overal bed mobility: Needs Assistance Bed Mobility: Rolling;Sidelying to Sit;Sit to Supine Rolling: Max assist Sidelying to sit: Max assist   Sit to supine: Total assist   General bed mobility comments: assisted pt to reach for rail, used pad under hips to turn hips, brought pt's feet off bed and assisted to lift trunk upright; assisted to supine lifting legs and lowering trunk, pt able to assist some with rails to scoot up in bed  Transfers Overall transfer level: Needs assistance Equipment used: Rolling walker (2 wheeled) Transfers: Sit to/from Stand Sit to Stand: Total assist         General transfer comment: unable to stand with +1 total assist  Ambulation/Gait                Stairs            Wheelchair  Mobility    Modified Rankin (Stroke Patients Only)       Balance Overall balance assessment: Needs assistance Sitting-balance support: Bilateral upper extremity supported;Feet supported Sitting balance-Leahy Scale: Poor Sitting balance - Comments: initially mod/max support due to posterior and R lateral lean, then able to sit with supervision briefly after working on anterior weight shift and less support given, fatigued quickly needing to return to supine; sat about 8 minutes at EOB                                     Pertinent Vitals/Pain Pain Assessment: Faces Faces Pain Scale: Hurts even more Pain Location: generalized with movement Pain Descriptors / Indicators: Grimacing Pain Intervention(s): Monitored during session;Repositioned    Home Living Family/patient expects to be discharged to:: Skilled nursing facility                      Prior Function Level of Independence: Needs assistance   Gait / Transfers Assistance Needed: Daughter states it has been a couple of months since patient able to walk.  Staff at [facility] reportedly have been using lift equipent to get pt. to a reclining type chair.  D[place]e says pt. does not currently have a cushion when she sits up (information from previous admission, no family present, pt poor historian)           Hand Dominance   Dominant  Hand: Right    Extremity/Trunk Assessment               Lower Extremity Assessment: RLE deficits/detail;LLE deficits/detail RLE Deficits / Details: AAROM limited knee flexion due to pain, strength 3-/5 LLE Deficits / Details: AAROM limited knee flexion due to pain, strength 3-/5     Communication   Communication: No difficulties  Cognition Arousal/Alertness: Awake/alert Behavior During Therapy: Anxious Overall Cognitive Status: No family/caregiver present to determine baseline cognitive functioning                      General Comments       Exercises General Exercises - Lower Extremity Heel Slides: AAROM;Both;5 reps;Supine      Assessment/Plan    PT Assessment Patient needs continued PT services  PT Diagnosis Generalized weakness   PT Problem List Decreased strength;Decreased range of motion;Decreased balance;Decreased mobility;Decreased activity tolerance  PT Treatment Interventions Balance training;Functional mobility training;Patient/family education;Wheelchair mobility training;Therapeutic exercise;Therapeutic activities   PT Goals (Current goals can be found in the Care Plan section) Acute Rehab PT Goals Patient Stated Goal: None stated PT Goal Formulation: Patient unable to participate in goal setting Time For Goal Achievement: 04/04/2015 Potential to Achieve Goals: Fair    Frequency Min 2X/week   Barriers to discharge        Co-evaluation               End of Session Equipment Utilized During Treatment: Gait belt Activity Tolerance: Patient limited by pain Patient left: in bed;with call bell/phone within reach;with bed alarm set           Time: [id number] PT Time Calculation (min) (ACUTE ONLY): 25 min   Charges:   PT Evaluation $PT Eval Moderate Complexity: 1 Procedure PT Treatments $Therapeutic Activity: 8-22 mins   PT G CodesReginia Naas 04/17/15, 1:15 PM  Magda Kiel, [place] 04-17-15

## 2015-03-27 NOTE — Progress Notes (Signed)
[facility]                                                                              Patient Demographics  [name], is a 75 y.o. female, DOB - 11-Dec-1940, [id number]  Admit date - 03/26/2015   Admitting Physician Reubin Milan, MD  Outpatient Primary MD for the patient is Kandice Hams, MD  LOS - 1   Chief Complaint  Patient presents with  . Failure To Thrive  . Weakness  . Abdominal Pain       Brief HPI   Per Dr. Olevia Bowens on 2/6 [name] President is a 75 y.o. female with a past medical history of Crohn's disease, admitted multiple times last year between June and August due to colitis exacerbation and underwent I&D for rectal abscess and fistula, underwent ileostomy on 09/13/2014, readmitted on 09/29/2014 and 10/10/2014 for GI bleed, CAD, CABG, CVA, hyperlipidemia, hypertension, hypothyroidism, GERD, depression, DVT, status post IVC filter placement on 08/30/2014,  resident at [facility] rehabilitation facility presented to ED with complaints of weakness and abdominal pain after following with Dr. Barry Dienes at her office on 2/6 Per daughter, for the past 2 weeks the patient has been feeling progressively weaker, decreased appetite and persistent chills. She complains of rectal area pain since last week. She has had sharp abdominal pain for the past 3 days associated with mild nausea, no increase in output of fluid, but increase gas has been noticed in the ileostomy bag. She also has had mild abdominal distention. She denied chest pain, palpitations, dizziness, diaphoresis, pitting edema lower extremities.  Workup in the emergency department shows hyponatremia 121, mild elevation in lipase, mild hypomagnesemia. CT scan of the abdomen does not show any acute abnormalities.    Assessment & Plan    Principal Problem:   Hyponatremia - Repeat labs are still pending, ordered multiple times, UA showed ketones - Continue IV fluid hydration - Serum osm 273,  urine osm, UNA pending  Active Problems: Urinary tract infection - Continue IV Rocephin, follow urine culture and sensitivities    Essential hypertension - BP currently borderline, continue IV fluids    General weakness likely due to significant hyponatremia - PTOT evaluation  Abdominal pain, hx of Inflammatory bowel disease, history of rectal fistula - CT abdomen and pelvis showed no acute findings, no abscess -  Patient has a long-standing history of ulcerative colitis, was admitted 8/16 with acute ulcerative colitis and rectal bleeding, CT had shown pancolitis. Patient was seen in 12/16 by Dr. 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 - continue IVF, continue IV Rocephin and Flagyl - follow lipase, CMET, will consult gastroenterology if worsening abdominal pain or fevers or chills    Pressure ulcer, sacral, stage III - Wound care following    Hypomagnesemia - Replaced IV  Code Status: Full CODE STATUS  Family Communication: Discussed in detail with the patient, all imaging results, lab results explained to the patient    Disposition Plan: Skilled nursing facility once stable  Time Spent in minutes   25 minutes  Procedures  CT abdomen and pelvis  Consults  None  DVT Prophylaxis SCD's  Medications  Scheduled Meds: . cefTRIAXone (ROCEPHIN)  IV  2 g Intravenous Q24H  . collagenase   Topical Daily  . lamoTRIgine  25 mg Oral Daily  . mesalamine  4.8 g Oral Daily  . metoprolol tartrate  25 mg Oral BID  . metronidazole  500 mg Intravenous Q8H  . mirtazapine  15 mg Oral QHS  . rosuvastatin  40 mg Oral QHS  . sertraline  100 mg Oral Daily  . sodium chloride flush  3 mL Intravenous Q12H   Continuous Infusions: . sodium chloride 100 mL/hr at 03/26/15 2251   PRN Meds:.acetaminophen, ALPRAZolam, ondansetron **OR** ondansetron (ZOFRAN) IV, oxyCODONE   Antibiotics   Anti-infectives    Start     Dose/Rate Route  Frequency Ordered Stop   03/26/15 2200  metroNIDAZOLE (FLAGYL) IVPB 500 mg     500 mg 100 mL/hr over 60 Minutes Intravenous Every 8 hours 03/26/15 2107     03/26/15 2115  cefTRIAXone (ROCEPHIN) 2 g in dextrose 5 % 50 mL IVPB     2 g 100 mL/hr over 30 Minutes Intravenous Every 24 hours 03/26/15 2107          Subjective:   [name] was seen and examined today. Complaining of generalized weakness but no fevers or chills, at the time of my encounter, no significant abdominal pain. Patient denies dizziness, chest pain, shortness of breath. No acute events overnight.    Objective:   Blood pressure 97/50, pulse 68, temperature 98 F (36.7 C), temperature source Oral, resp. rate 18, weight 73.211 kg (161 lb 6.4 oz), SpO2 97 %.  Wt Readings from Last 3 Encounters:  03/27/15 73.211 kg (161 lb 6.4 oz)  03/14/15 61.236 kg (135 lb)  03/09/15 77.707 kg (171 lb 5 oz)     Intake/Output Summary (Last 24 hours) at 03/27/15 1056 Last data filed at 03/27/15 0736  Gross per 24 hour  Intake    440 ml  Output    205 ml  Net    235 ml    Exam  General: Alert and oriented x 3, NAD  HEENT:  PERRLA, EOMI, Anicteric Sclera, dry mucosal membranes  Neck: Supple, no JVD, no masses  CVS: S1 S2 auscultated, no rubs, murmurs or gallops. Regular rate and rhythm.  Respiratory: Clear to auscultation bilaterally, no wheezing, rales or rhonchi  Abdomen: Soft, ileostomy bag in place, no significant tenderness  Ext: no cyanosis clubbing or edema  Neuro: generalized weakness, poor effort,  Skin: No rashes  Psych: Normal affect and demeanor, alert and oriented x3    Data Review   Micro Results Recent Results (from the past 240 hour(s))  MRSA PCR Screening     Status: None   Collection Time: 03/27/15  7:46 AM  Result Value Ref Range Status   MRSA by PCR NEGATIVE NEGATIVE Final    Comment:        The GeneXpert MRSA Assay (FDA approved for NASAL specimens only), is one component of  a comprehensive MRSA colonization surveillance program. It is not intended to diagnose MRSA infection nor to guide or monitor treatment for MRSA infections.     Radiology Reports Ct Abdomen Pelvis Wo Contrast  03/26/2015  CLINICAL DATA:  Generalized abdominal pain, weakness, loss of appetite. EXAM: CT ABDOMEN AND PELVIS WITHOUT CONTRAST TECHNIQUE: Multidetector CT imaging of the abdomen and pelvis was performed following the standard protocol without IV contrast. COMPARISON:  10/10/2014 FINDINGS: Minimal dependent atelectasis in the  lung bases. No effusions. Prior CABG. Heart is normal size. Prior cholecystectomy. Liver, spleen, pancreas, adrenals are unremarkable. Punctate nonobstructing left lower pole renal stone. Small low-density areas within the kidneys bilaterally. No hydronephrosis. Aorta and iliac vessels are densely calcified. No aneurysm. IVC filter noted within the infrarenal IVC. Foley catheter is present in the bladder which is decompressed. Multiple calcifications adjacent to the Foley balloon, likely bladder stones. Right lower quadrant ileostomy, stable. Stomach and large bowel grossly no free fluid, free air or adenopathy. No acute bony abnormality or focal bone lesion. IMPRESSION: No acute findings in the abdomen or pelvis. Heavily calcified aorta and branch vessels.  No aneurysm. Right lower quadrant ileostomy, stable. IVC filter placed. Electronically Signed   By: Rolm Baptise M.D.   On: 03/26/2015 18:16   Dg Op Swallowing Func-medicare/speech Path  03/12/2015  CLINICAL DATA:  75 year old female with difficulty swallowing. EXAM: MODIFIED BARIUM SWALLOW TECHNIQUE: Different consistencies of barium were administered orally to the patient by the Speech Pathologist. Imaging of the pharynx was performed in the lateral projection. FLUOROSCOPY TIME:  Radiation Exposure Index (as provided by the fluoroscopic device): If the device does not provide the exposure index: Fluoroscopy Time:  2  minutes 50 seconds Number of Acquired Images:  None COMPARISON:  Chest radiographs 08/12/2013 FINDINGS: Thin liquid- delayed oral transit with premature spill and delayed swallow trigger. With thin barium filling the vallecula the patient had to be reminded multiple times to swallow. Nectar thick liquid- not given Honey- not given Pure- delayed oral transit, mildly delayed swallow trigger. Cracker-delayed oral transit and delayed swallow trigger. Barium tablet with puree - within normal limits. A initial attempt to swallow the barium tablet with thin barium was unsuccessful. Barium tablet - mildly slow transit of barium tablet through the esophagus, but ultimately passed to the gastroesophageal junction without evidence of obstruction. Esophageal caliber appears normal. IMPRESSION: Dominant findings of delayed oral transit, premature spill contrast and delayed swallow trigger. No penetration or aspiration. Please refer to the Speech Pathologists report for complete details and recommendations. Electronically Signed   By: Genevie Ann M.D.   On: 03/12/2015 11:11    CBC  Recent Labs Lab 03/26/15 1609  WBC 9.4  HGB 12.9  HCT 40.1  PLT 209  MCV 82.0  MCH 26.4  MCHC 32.2  RDW 17.2*  LYMPHSABS 1.1  MONOABS 0.5  EOSABS 0.0  BASOSABS 0.0    Chemistries   Recent Labs Lab 03/26/15 1609 03/26/15 2219  NA 121*  --   K 5.0  --   CL 93*  --   CO2 16*  --   GLUCOSE 129*  --   BUN 20  --   CREATININE 1.07*  --   CALCIUM 9.8  --   MG  --  1.6*  AST 31  --   ALT 12*  --   ALKPHOS 110  --   BILITOT 0.3  --    ------------------------------------------------------------------------------------------------------------------ estimated creatinine clearance is 47.6 mL/min (by C-G formula based on Cr of 1.07). ------------------------------------------------------------------------------------------------------------------ No results for input(s): HGBA1C in the last 72  hours. ------------------------------------------------------------------------------------------------------------------ No results for input(s): CHOL, HDL, LDLCALC, TRIG, CHOLHDL, LDLDIRECT in the last 72 hours. ------------------------------------------------------------------------------------------------------------------ No results for input(s): TSH, T4TOTAL, T3FREE, THYROIDAB in the last 72 hours.  Invalid input(s): FREET3 ------------------------------------------------------------------------------------------------------------------ No results for input(s): VITAMINB12, FOLATE, FERRITIN, TIBC, IRON, RETICCTPCT in the last 72 hours.  Coagulation profile No results for input(s): INR, PROTIME in the last 168 hours.  No results for  input(s): DDIMER in the last 72 hours.  Cardiac Enzymes No results for input(s): CKMB, TROPONINI, MYOGLOBIN in the last 168 hours.  Invalid input(s): CK ------------------------------------------------------------------------------------------------------------------ Invalid input(s): POCBNP  No results for input(s): GLUCAP in the last 72 hours.   Aren Cherne M.D. [facility] 03/27/2015, 10:56 AM  Pager: [phone] Between 7am to 7pm - call Pager - [phone]  After 7pm go to [email or web address] - password [email or web address]  Call night coverage person covering after 7pm

## 2015-03-27 NOTE — Clinical Social Work Note (Signed)
Clinical Social Work Assessment  Patient Details  Name: [name] MRN: [id number] Date of Birth: [birth year]  Date of referral:  03/27/15               Reason for consult:  Facility Placement                Permission sought to share information with:  Facility Sport and exercise psychologist, Family Supports Permission granted to share information::  Yes, Verbal Permission Granted  Name::        Agency::     Relationship::     Contact Information:     Housing/Transportation Living arrangements for the past 2 months:  [place]) Source of Information:  Adult Children (power of attorney and daughter [name]) [phone]) Patient Interpreter Needed:  None Criminal Activity/Legal Involvement Pertinent to Current Situation/Hospitalization:  No - Comment as needed Significant Relationships:  Adult Children, Spouse (daughter [name]) husband [name])) Lives with:  Facility Resident Do you feel safe going back to the place where you live?  No (patient daughter does not want her mother to return to [place]) Need for family participation in patient care:  Yes (Comment)  Care giving concerns:  PT recommend SNF   Social Worker assessment / plan:  BSW intern entered patient room. Patient was alert and then fell asleep. BSW intern completed assessment via telephone with her daughter [name]). BSW intern explained PT recommendation and referral process. Patient daughter states that her mom is a resident of [facility], and does not wish to return due to personal reasons. Patient daughter is agreeable to other SNFs. Patient daughter is considering [facility] as their first choice, followed by [facility]. Patient daughter states that her mom does not walk and is wheelchair bound. Patient daughter also states that she will be researching other facility in the [place] area. BSW intern left a packet of facilities with patient with  supervisor name and  contact number.    Employment status:  Disabled (Comment on whether or not currently receiving Disability) Insurance information:  Medicare Personnel officer) PT Recommendations:  [place] / Referral to community resources:  Acute Rehab  Patient/Family's Response to care:  Patient daughter is understanding to patience response of care.  Patient/Family's Understanding of and Emotional Response to Diagnosis, Current Treatment, and Prognosis:  Patient daughter is aware of patient current treatment and diagnosis.   Emotional Assessment Appearance:  Appears older than stated age Attitude/Demeanor/Rapport:  Unable to Assess Affect (typically observed):  Unable to Assess Orientation:  Oriented to Self, Oriented to Place, Oriented to  Time, Oriented to Situation Alcohol / Substance use:  Never Used Psych involvement (Current and /or in the community):  No (Comment)  Discharge Needs  Concerns to be addressed:  No discharge needs identified Readmission within the last 30 days:  No Current discharge risk:  None Barriers to Discharge:  No Barriers Identified   [name], Student-SW 03/27/2015, 2:18 PM

## 2015-03-27 NOTE — Consult Note (Signed)
WOC wound consult note Reason for Consult: sacral pressure injury Noted to have ileostomy as well Wound type:  Unstageable Pressure Injury: gluteal cleft; 100% soft, black 2 Stage 3 Pressure Injuries; left buttock; both pink, dry Pressure Ulcer POA: Yes Measurement: Gluteal cleft: 3cm x 1.5cm x 0 Left buttock 0.5cm x 0.5cm x 0.1cm and 1cm x 0.5cm x 0.1cm  Wound bed: see above Drainage (amount, consistency, odor) minimal, no odor Periwound: intact, tender  Dressing procedure/placement/frequency: Will add enzymatic debridement for the gluteal cleft, top with small moist gauze, foam. Apply enzyme daily, change gauze.  Ok to leave foam in place for up to 3 days.  WOC ostomy consult note Stoma type/location: RLQ, end ileostomy Stomal assessment/size: visualized through pouch, pink, moist, aprox 1 3/8"  Peristomal assessment: pouch intact Treatment options for stomal/peristomal skin: NA Output liquid, brown effluent Ostomy pouching: 1pc No family at the bedside, patient not really able to tell me if she is independent or not  Will order supplies to the bedside for staff to use as needed.   Discussed POC with bedside nurse.  Re consult if needed, will not follow at this time. Thanks  Lateisha Thurlow [place], [place] [phone])

## 2015-03-28 DIAGNOSIS — K50113 Crohn's disease of large intestine with fistula: Secondary | ICD-10-CM

## 2015-03-28 DIAGNOSIS — E871 Hypo-osmolality and hyponatremia: Principal | ICD-10-CM

## 2015-03-28 DIAGNOSIS — K6389 Other specified diseases of intestine: Secondary | ICD-10-CM

## 2015-03-28 DIAGNOSIS — K604 Rectal fistula: Secondary | ICD-10-CM

## 2015-03-28 LAB — BASIC METABOLIC PANEL
Anion gap: 10 (ref 5–15)
BUN: 12 mg/dL (ref 6–20)
CHLORIDE: 107 mmol/L (ref 101–111)
CO2: 17 mmol/L — AB (ref 22–32)
CREATININE: 0.62 mg/dL (ref 0.44–1.00)
Calcium: 8.8 mg/dL — ABNORMAL LOW (ref 8.9–10.3)
GFR calc non Af Amer: >60 mL/min (ref >60)
Glucose, Bld: 86 mg/dL (ref 65–99)
POTASSIUM: 3.8 mmol/L (ref 3.5–5.1)
Sodium: 134 mmol/L — ABNORMAL LOW (ref 135–145)

## 2015-03-28 LAB — CBC WITH DIFFERENTIAL/PLATELET
BASOS ABS: 0 10*3/uL (ref 0.0–0.1)
BASOS PCT: 0 %
Eosinophils Absolute: 0.1 10*3/uL (ref 0.0–0.7)
Eosinophils Relative: 2 %
HEMATOCRIT: 35.9 % — AB (ref 36.0–46.0)
HEMOGLOBIN: 11.7 g/dL — AB (ref 12.0–15.0)
LYMPHS PCT: 26 %
Lymphs Abs: 1.7 10*3/uL (ref 0.7–4.0)
MCH: 26.8 pg (ref 26.0–34.0)
MCHC: 32.6 g/dL (ref 30.0–36.0)
MCV: 82.3 fL (ref 78.0–100.0)
MONOS PCT: 9 %
Monocytes Absolute: 0.6 10*3/uL (ref 0.1–1.0)
NEUTROS ABS: 4 10*3/uL (ref 1.7–7.7)
Neutrophils Relative %: 63 %
Platelets: 167 10*3/uL (ref 150–400)
RBC: 4.36 MIL/uL (ref 3.87–5.11)
RDW: 17.5 % — ABNORMAL HIGH (ref 11.5–15.5)
WBC: 6.4 10*3/uL (ref 4.0–10.5)

## 2015-03-28 MED ORDER — ADULT MULTIVITAMIN W/MINERALS CH
1.0000 | ORAL_TABLET | Freq: Every day | ORAL | Status: DC
  Administered 2015-03-28 – 2015-03-30 (×2): 1 via ORAL
  Filled 2015-03-28 (×2): qty 1

## 2015-03-28 MED ORDER — MEGESTROL ACETATE 40 MG PO TABS
40.0000 mg | ORAL_TABLET | Freq: Every day | ORAL | Status: DC
  Administered 2015-03-28: 40 mg via ORAL
  Filled 2015-03-28 (×3): qty 1

## 2015-03-28 MED ORDER — METRONIDAZOLE 500 MG PO TABS
500.0000 mg | ORAL_TABLET | Freq: Three times a day (TID) | ORAL | Status: DC
  Administered 2015-03-28 – 2015-03-30 (×6): 500 mg via ORAL
  Filled 2015-03-28 (×6): qty 1

## 2015-03-28 MED ORDER — PRO-STAT SUGAR FREE PO LIQD
30.0000 mL | Freq: Two times a day (BID) | ORAL | Status: DC
  Administered 2015-03-28 – 2015-03-29 (×2): 30 mL via ORAL
  Filled 2015-03-28 (×3): qty 30

## 2015-03-28 NOTE — Consult Note (Signed)
   [facility] Inpatient Consult   03/28/2015  [name] 1940/04/21 [id number] Patient was screened for admission and follow up with care management needs.  Chart review reveals that patient will likely return to skilled nursing facility.  Patient is a [employer] patient. Patient had been followed previously by [facility] social worker but patient was long term for skilled. No community care management needs identified at this time.  For questions, please contact: Natividad Brood, RN BSN [facility] Liaison  [phone] business mobile phone Toll free office [phone]

## 2015-03-28 NOTE — Progress Notes (Signed)
SLP Cancellation Note  Patient Details Name: [name] MRN: [id number] DOB: 10-29-1940   Cancelled treatment:       Reason Eval/Treat Not Completed: Other (comment) (pt being d/cd from [facility])   [name] 03/28/2015, 2:56 PM

## 2015-03-28 NOTE — Progress Notes (Signed)
Initial Nutrition Assessment  DOCUMENTATION CODES:   Severe malnutrition in context of chronic illness  INTERVENTION:  Provide Ensure Enlive po BID, each supplement provides 350 kcal and 20 grams of protein Provide snacks BID Provide 30 ml Pro-stat PO BID, each dose provides 100 kcal and 15 grams of protein Provide Multivitamin with minerals daily   NUTRITION DIAGNOSIS:   Malnutrition related to chronic illness, poor appetite as evidenced by energy intake < 75% for > or equal to 1 month, severe depletion of muscle mass, percent weight loss.   GOAL:   Patient will meet greater than or equal to 90% of their needs   MONITOR:   PO intake, Supplement acceptance, Labs, Weight trends, I & O's, Skin  REASON FOR ASSESSMENT:   Malnutrition Screening Tool    ASSESSMENT:   75 y.o. female with a past medical history of Crohn's disease, admitted multiple times last year between June and August due to colitis exacerbation and underwent I&D for rectal abscess and fistula, underwent ileostomy on 09/13/2014, readmitted on 09/29/2014 and 10/10/2014 for GI bleed, CAD, CABG, CVA, hyperlipidemia, hypertension, hypothyroidism, GERD, depression, DVT, status post IVC filter placement on 08/30/2014 who is currently a resident at [facility] rehabilitation facility and is coming to the emergency department for complaints  of abdominal pain and weakness.   Pt states that she has had no appetite for months and has been eating close to nothing; she usually only drinks 2 bottles of Ensure per day which is all she feels she can tolerate. She denies any symptoms contributing to poor appetite; she denies nausea, diarrhea, constipation. She reports abdominal pain at time of visit. Pt has lost > 23% of her body weight within the past 6-7 months and severe muscle wasting noted in nutrition-focused physical exam. RD encouraged pt to eats snacks frequently throughout the day. Encouraged increasing intake of Ensure,  but pt does not think she can tolerate more than 2 daily.   Labs reviewed.   Diet Order:  Diet heart healthy/carb modified Room service appropriate?: Yes; Fluid consistency:: Thin  Skin:  Wound (see comment) (Stage II pressure ulcer on buttocks, unsteageable pressure ulcer on sacrum)  Last BM:  2/7  Height:   Ht Readings from Last 1 Encounters:  03/14/15 5' 9.5" (1.765 m)    Weight:   Wt Readings from Last 1 Encounters:  03/28/15 169 lb 6.4 oz (76.839 kg)    Ideal Body Weight:  67 kg  BMI:  Body mass index is 24.67 kg/(m^2).  Estimated Nutritional Needs:   Kcal:  1900-2100  Protein:  90-100 grams  Fluid:  1.9-2.1 L/day  EDUCATION NEEDS:   No education needs identified at this time  [place], LDN Inpatient Clinical Dietitian Pager: [phone] After Hours Pager: [phone]

## 2015-03-28 NOTE — Progress Notes (Signed)
[facility]                                                                              Patient Demographics  [name], is a 75 y.o. female, DOB - 11/13/1940, [id number]  Admit date - 03/26/2015   Admitting Physician Reubin Milan, MD  Outpatient Primary MD for the patient is Kandice Hams, MD  LOS - 2   Chief Complaint  Patient presents with  . Failure To Thrive  . Weakness  . Abdominal Pain       Brief HPI   Per Dr. Olevia Bowens on 2/6 [name] is a 75 y.o. female with a past medical history of Crohn's disease, admitted multiple times last year between June and August due to colitis exacerbation and underwent I&D for rectal abscess and fistula, underwent ileostomy on 09/13/2014, readmitted on 09/29/2014 and 10/10/2014 for GI bleed, CAD, CABG, CVA, hyperlipidemia, hypertension, hypothyroidism, GERD, depression, DVT, status post IVC filter placement on 08/30/2014,  resident at [facility] rehabilitation facility presented to ED with complaints of weakness and abdominal pain after following with Dr. Barry Dienes at her office on 2/6 Per daughter, for the past 2 weeks the patient has been feeling progressively weaker, decreased appetite and persistent chills. She complains of rectal area pain since last week. She has had sharp abdominal pain for the past 3 days associated with mild nausea, no increase in output of fluid, but increase gas has been noticed in the ileostomy bag. She also has had mild abdominal distention. She denied chest pain, palpitations, dizziness, diaphoresis, pitting edema lower extremities.  Workup in the emergency department shows hyponatremia 121, mild elevation in lipase, mild hypomagnesemia. CT scan of the abdomen does not show any acute abnormalities.    Assessment & Plan    Principal Problem:   Hyponatremia- resolved, sodium 121 at the time of admission - UA showed ketones, patient was placed on IV fluid hydration -Sodium improving  to 134 today - Serum osm 273, urine osm, UNA pending  Active Problems: Urinary tract infection - Continue IV Rocephin, follow urine culture and sensitivities    Essential hypertension - BP currently borderline, continue IV fluids    General weakness likely due to significant hyponatremia - PTOT evaluation-> recommended skilled nursing facility  Abdominal pain, hx of Inflammatory bowel disease, history of rectal fistula: Patient reports chronic abdominal pain, no worsening - CT abdomen and pelvis showed no acute findings, no abscess -  Patient has a long-standing history of ulcerative colitis, was admitted 8/16 with acute ulcerative colitis and rectal bleeding, CT had shown pancolitis. Patient was seen in 12/16 by Dr. 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 - continue IVF, continue IV Rocephin and Flagyl - Lipase normal, LFTs normal, will consult gastroenterology if worsening abdominal pain or fevers or chills    Pressure ulcer, sacral, stage III - Wound care following    Hypomagnesemia - Replaced IV  Code Status: Full CODE STATUS  Family Communication: Discussed in detail with the patient, all imaging results, lab results explained to the patient    Disposition Plan: Skilled nursing facility once stable  Time Spent in minutes   25 minutes  Procedures  CT abdomen and pelvis  Consults   None  DVT Prophylaxis SCD's  Medications  Scheduled Meds: . cefTRIAXone (ROCEPHIN)  IV  2 g Intravenous Q24H  . collagenase   Topical Daily  . feeding supplement (ENSURE ENLIVE)  237 mL Oral BID BM  . Influenza vac split quadrivalent PF  0.5 mL Intramuscular Tomorrow-1000  . lamoTRIgine  25 mg Oral Daily  . mesalamine  4.8 g Oral Daily  . metoprolol tartrate  25 mg Oral BID  . metronidazole  500 mg Intravenous Q8H  . mirtazapine  15 mg Oral QHS  . pneumococcal 23 valent vaccine  0.5 mL Intramuscular Tomorrow-1000  .  rosuvastatin  40 mg Oral QHS  . sertraline  100 mg Oral Daily  . sodium chloride flush  3 mL Intravenous Q12H   Continuous Infusions: . sodium chloride 100 mL/hr at 03/27/15 1844   PRN Meds:.acetaminophen, ALPRAZolam, ondansetron **OR** ondansetron (ZOFRAN) IV, oxyCODONE   Antibiotics   Anti-infectives    Start     Dose/Rate Route Frequency Ordered Stop   03/26/15 2200  metroNIDAZOLE (FLAGYL) IVPB 500 mg     500 mg 100 mL/hr over 60 Minutes Intravenous Every 8 hours 03/26/15 2107     03/26/15 2115  cefTRIAXone (ROCEPHIN) 2 g in dextrose 5 % 50 mL IVPB     2 g 100 mL/hr over 30 Minutes Intravenous Every 24 hours 03/26/15 2107          Subjective:   [name] was seen and examined today. Feeling a lot better today, weakness is improving. No fevers or chills, states chronic abdominal pain, no worsening. Patient denies dizziness, chest pain, shortness of breath. No acute events overnight.    Objective:   Blood pressure 109/57, pulse 81, temperature 97.8 F (36.6 C), temperature source Oral, resp. rate 18, weight 76.839 kg (169 lb 6.4 oz), SpO2 97 %.  Wt Readings from Last 3 Encounters:  03/28/15 76.839 kg (169 lb 6.4 oz)  03/14/15 61.236 kg (135 lb)  03/09/15 77.707 kg (171 lb 5 oz)     Intake/Output Summary (Last 24 hours) at 03/28/15 1044 Last data filed at 03/28/15 0800  Gross per 24 hour  Intake 2408.33 ml  Output   1400 ml  Net 1008.33 ml    Exam  General: Alert and oriented x 3, NAD  HEENT:  PERRLA, EOMI, Anicteric Sclera, dry mucosal membranes  Neck: Supple, no JVD, no masses  CVS: S1 S2 auscultated, no rubs, murmurs or gallops. Regular rate and rhythm.  Respiratory: Clear to auscultation bilaterally, no wheezing, rales or rhonchi  Abdomen: Soft, ileostomy bag in place, mild epigastric tenderness (per patient chronic)  Ext: no cyanosis clubbing or edema  Neuro: generalized weakness, poor effort,  Skin: No rashes  Psych: Normal affect and  demeanor, alert and oriented x3    Data Review   Micro Results Recent Results (from the past 240 hour(s))  MRSA PCR Screening     Status: None   Collection Time: 03/27/15  7:46 AM  Result Value Ref Range Status   MRSA by PCR NEGATIVE NEGATIVE Final    Comment:        The GeneXpert MRSA Assay (FDA approved for NASAL specimens only), is one component of a comprehensive MRSA colonization surveillance program. It is not intended to diagnose MRSA infection nor to guide or monitor treatment for MRSA infections.     Radiology Reports Ct Abdomen Pelvis  Wo Contrast  03/26/2015  CLINICAL DATA:  Generalized abdominal pain, weakness, loss of appetite. EXAM: CT ABDOMEN AND PELVIS WITHOUT CONTRAST TECHNIQUE: Multidetector CT imaging of the abdomen and pelvis was performed following the standard protocol without IV contrast. COMPARISON:  10/10/2014 FINDINGS: Minimal dependent atelectasis in the lung bases. No effusions. Prior CABG. Heart is normal size. Prior cholecystectomy. Liver, spleen, pancreas, adrenals are unremarkable. Punctate nonobstructing left lower pole renal stone. Small low-density areas within the kidneys bilaterally. No hydronephrosis. Aorta and iliac vessels are densely calcified. No aneurysm. IVC filter noted within the infrarenal IVC. Foley catheter is present in the bladder which is decompressed. Multiple calcifications adjacent to the Foley balloon, likely bladder stones. Right lower quadrant ileostomy, stable. Stomach and large bowel grossly no free fluid, free air or adenopathy. No acute bony abnormality or focal bone lesion. IMPRESSION: No acute findings in the abdomen or pelvis. Heavily calcified aorta and branch vessels.  No aneurysm. Right lower quadrant ileostomy, stable. IVC filter placed. Electronically Signed   By: Rolm Baptise M.D.   On: 03/26/2015 18:16   Dg Op Swallowing Func-medicare/speech Path  03/12/2015  CLINICAL DATA:  75 year old female with difficulty  swallowing. EXAM: MODIFIED BARIUM SWALLOW TECHNIQUE: Different consistencies of barium were administered orally to the patient by the Speech Pathologist. Imaging of the pharynx was performed in the lateral projection. FLUOROSCOPY TIME:  Radiation Exposure Index (as provided by the fluoroscopic device): If the device does not provide the exposure index: Fluoroscopy Time:  2 minutes 50 seconds Number of Acquired Images:  None COMPARISON:  Chest radiographs 08/12/2013 FINDINGS: Thin liquid- delayed oral transit with premature spill and delayed swallow trigger. With thin barium filling the vallecula the patient had to be reminded multiple times to swallow. Nectar thick liquid- not given Honey- not given Pure- delayed oral transit, mildly delayed swallow trigger. Cracker-delayed oral transit and delayed swallow trigger. Barium tablet with puree - within normal limits. A initial attempt to swallow the barium tablet with thin barium was unsuccessful. Barium tablet - mildly slow transit of barium tablet through the esophagus, but ultimately passed to the gastroesophageal junction without evidence of obstruction. Esophageal caliber appears normal. IMPRESSION: Dominant findings of delayed oral transit, premature spill contrast and delayed swallow trigger. No penetration or aspiration. Please refer to the Speech Pathologists report for complete details and recommendations. Electronically Signed   By: Genevie Ann M.D.   On: 03/12/2015 11:11    CBC  Recent Labs Lab 03/26/15 1609 03/27/15 2300 03/28/15 0530  WBC 9.4 7.9 6.4  HGB 12.9 11.9* 11.7*  HCT 40.1 37.2 35.9*  PLT 209 204 167  MCV 82.0 81.6 82.3  MCH 26.4 26.1 26.8  MCHC 32.2 32.0 32.6  RDW 17.2* 17.4* 17.5*  LYMPHSABS 1.1 1.3 1.7  MONOABS 0.5 0.6 0.6  EOSABS 0.0 0.1 0.1  BASOSABS 0.0 0.0 0.0    Chemistries   Recent Labs Lab 03/26/15 1609 03/26/15 2219 03/27/15 1850 03/28/15 0530  NA 121*  --  131* 134*  K 5.0  --  4.0 3.8  CL 93*  --  103  107  CO2 16*  --  16* 17*  GLUCOSE 129*  --  80 86  BUN 20  --  12 12  CREATININE 1.07*  --  0.66 0.62  CALCIUM 9.8  --  9.0 8.8*  MG  --  1.6*  --   --   AST 31  --  28  --   ALT 12*  --  12*  --  ALKPHOS 110  --  91  --   BILITOT 0.3  --  0.2*  --    ------------------------------------------------------------------------------------------------------------------ estimated creatinine clearance is 63.7 mL/min (by C-G formula based on Cr of 0.62). ------------------------------------------------------------------------------------------------------------------ No results for input(s): HGBA1C in the last 72 hours. ------------------------------------------------------------------------------------------------------------------ No results for input(s): CHOL, HDL, LDLCALC, TRIG, CHOLHDL, LDLDIRECT in the last 72 hours. ------------------------------------------------------------------------------------------------------------------  Recent Labs  03/27/15 1850  TSH 0.617   ------------------------------------------------------------------------------------------------------------------ No results for input(s): VITAMINB12, FOLATE, FERRITIN, TIBC, IRON, RETICCTPCT in the last 72 hours.  Coagulation profile No results for input(s): INR, PROTIME in the last 168 hours.  No results for input(s): DDIMER in the last 72 hours.  Cardiac Enzymes No results for input(s): CKMB, TROPONINI, MYOGLOBIN in the last 168 hours.  Invalid input(s): CK ------------------------------------------------------------------------------------------------------------------ Invalid input(s): POCBNP  No results for input(s): GLUCAP in the last 72 hours.   RAI,RIPUDEEP M.D. [facility] 03/28/2015, 10:44 AM  Pager: [phone] Between 7am to 7pm - call Pager - [phone]  After 7pm go to [email or web address] - password [email or web address]  Call night coverage person covering after 7pm

## 2015-03-28 NOTE — Consult Note (Signed)
[facility] Consult: 12:49 PM 03/28/2015  LOS: 2 days    Referring Provider: Dr Tana Coast  Primary Care Physician:  Kandice Hams, MD Primary Gastroenterologist:  Dr. Havery Moros.     Reason for Consultation:  abdominal pain.    HPI: [name] is a 75 y.o. female. PMH CAD, MI, CABG 2006, bil carotid artery disease/sp CEA, glucose intolerance/"prediabetes". Hx CVA.  Hx bil LE DVT, not on AC due to hx GI bleeding.  s/p IVC filter. Depression, anxiety.  Anemia.   Left sided UC, diagnosed 2006. Did well on Lialda 4.8 grams daily.  Colonoscopy 10/2012: scattered left sided tics and focal left sided colitis, mild cryptitis on Bx. Admission 07/2014:  08/08/14 Flex Sig: for bloody diarrhea, diffuse edema to 40 cm, pseudomembranes over inflamed but mostly normal tissue 40 to 70 cm. Pathology of descending/sigmoid: moderately active chronic colitis c/w IBD. Path on sigmoid/rectum: moderate to severe active chronic colitis. No evidence of C diff. C diff PCR negative 08/10/14. Treated with procto foam, probiotic, vanc/flagyl. Discharged on prednisone 20 mg daily, lialda 4.8 grams daily, Florastor .  Readmitted 7/5 - 09/21/14 with perirectal abscess, escalating rectal pain. Developed DVT, s/p IVC filter 7/13. Required nutritional support with TNA but tolerating po diet at discharge.  08/22/14 CT abdomen pelvis with contrast showing extensive gas in the right gluteal fat and musculature extending throughout the retroperitoneum. Thickening, consistent with colitis in the distal transverse and descending colon. 08/23/14: I & D of complex perirectal abscess.  09/06/14 Flex Sig: severe left sided colitis from anal canal to splenic flexure/80 cm. Bleeding from large ulcerations in left colon.  09/13/14: Laparoscopic diverting  ileostomy, exam under anesthesia. Normal TI and cecum. 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  Remicade initiated 7/15, 2nd dose 8/2, 3rd dose due 9/1.  Discharged to SNF on Augmentin, Lialda, florastor, prednisone taper through 8/25.  Infectious disease MD felt she would need another 2 to 3 weeks of abx.   Admission 8/12- 10/07/14 with rectal bleeding, pinkinsh discharge from abscess cavity. During admission some dark/blood tinged ostomy output notes.  09/29/14 CT scan without mention of colitis, abscess.  10/04/14 Flex Sig to mid sigmoid: mild colitis, small amount old blood but not active bleeding. Continued on Prednisone taper. Grew pseudomonas from urine, Zosyn replaced Augmentin during admission. Dr Hale Bogus opinion was that this was asymptomatic bacteruria related to pt indwelling foley which did not need treatement. He stopped Zosyn, Fluconazole, advising observation off anti-infectious meds.  Discharged back to SNF. 10/10/14 CT without contrast: pancolitis, packed right perianal and ischiorectal fossa abscess.  10/11/14 Flex Sig .  Dr Henrene Pastor. For hematochezia.  Mild-to-moderate active colitis as described. Suspect IBD.  Multiple biopsies taken.  Pathology: Chronic moderately active colitis.   With evolution to abscessing disease there is suspicion of Crohn's rather than UC. She has continued on q 8 week Remicade (5 mg per kg dose) and Lialda.     Latest GI office visit on 02/01/15. TPMT level 10.  MRI 02/14/15:  Right lateral transsphincteric perianal fistula, which courses both superiorly and inferiorly and exits into the gluteal crease. No associated abscess identified. 03/09/15 Remicade level was <0.4 (detectable range is 0.4 or higher).  The Infliximab Ab titre was 1713 (this is high per d/w the lab core service rep)   Last Remicade infusion: 03/09/15  Seen by Dr Barry Dienes for surgical follow up 2/6.  Pt c/o weakness and abdominal  pain, anorexia. Sent to ED.  Sodium low at 121, 134 today.  Lipase 71, down to 43 yesterday.  LFTs normal, though albumin low at 2.5.   Hgb is 11.7, good for her.  03/26/15 Non contrast CT scan abdomen/pelvis: No acute findings in the abdomen or pelvis. Heavily calcified aorta and branch vessels. No aneurysm.  Right lower quadrant ileostomy, stable. IVC filter placed.  Pt says pain in RLQ has been present for many months, at least since ostomy in 08/2014. Pain is no worse now than in past months.  Denies taking meds for pain.  Ostomy output watery brown, no blood, volume of stool output is stable.  Occasional rectal mucoid discharge. + anorexia, again at least since 08/2014.  Weight drop of 60# since 07/2014. No nausea or vomiting.   Weights: 03/28/15              169 lb (76.8 kg)  09/29/14 206 lb (93.441 kg)  09/21/14 217 lb 13 oz (98.8 kg)  08/07/14 229 lb 12.8 oz (104.237 kg)       hospitalist has initiated Megace for poor appetite, Flagyl and Rocephin for???.  Pt contd on Lialda.   Past Medical History  Diagnosis Date  . CAD (coronary artery disease)     unspecified  . Cerebrovascular disease 2010    bil carotid artery disease. left CEA 02/2004. 02/2014 carotid ultrasound: < 40% bil ICA disease.   Marland Kitchen Hyperlipidemia, mixed   . HTN (hypertension)   . Ulcerative colitis 2002  . Colon polyps 2006, 2014  . Thyroid disease   . Myocardial infarct ([place]) 1998  . Diabetes mellitus without complication (HCC)     Borderline  . GERD (gastroesophageal reflux disease)   . Arthritis   . 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   . Depression 10/12/2014  . DVT (deep venous thrombosis) ([place]) 08/29/14    left LE, s/p 08/30/14 IVC filter.     Past Surgical History  Procedure Laterality Date  . Cholecystectomy  7.06.2008    with cholangiogram  . 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 Abdominal hysterectomy    . Colonoscopy  multiple  . Sigmoidoscopy  multiple  . Flexible sigmoidoscopy  N/A 08/08/2014    Procedure: FLEXIBLE SIGMOIDOSCOPY;  Surgeon: Gatha Mayer, MD;  Location: [facility];  Service: Endoscopy;  Laterality: N/A;  . Incision and drainage perirectal abscess N/A 08/22/2014    Procedure: IRRIGATION AND DEBRIDEMENT PERIRECTAL ABSCESS;  Surgeon: Jackolyn Confer, MD;  Location: [facility];  Service: General;  Laterality: N/A;  . Joint replacement  left knee  . Flexible sigmoidoscopy N/A 09/06/2014    Procedure: FLEXIBLE SIGMOIDOSCOPY;  Surgeon: Lafayette Dragon, MD;  Location: [facility];  Service: Endoscopy;  Laterality: N/A;  . Laparoscopic diverted colostomy N/A 09/13/2014    Procedure: LAPAROSCOPIC DIVERTED ILEOSTOMY;  Surgeon: Stark Klein, MD;  Location: [facility];  Service: General;  Laterality: N/A;  . Rectal exam under anesthesia N/A 09/13/2014    Procedure: RECTAL EXAM UNDER ANESTHESIA  AND DEBRIDEMENT OF PERIANAL WOUND;  Surgeon: Stark Klein,  MD;  Location: [facility];  Service: General;  Laterality: N/A;  . Flexible sigmoidoscopy N/A 10/04/2014    Procedure: FLEXIBLE SIGMOIDOSCOPY;  Surgeon: Inda Castle, MD;  Location: [facility];  Service: Endoscopy;  Laterality: N/A;  . Flexible sigmoidoscopy N/A 10/11/2014    Procedure: FLEXIBLE SIGMOIDOSCOPY;  Surgeon: Irene Shipper, MD;  Location: [facility];  Service: Endoscopy;  Laterality: N/A;    Prior to Admission medications   Medication Sig Start Date End Date Taking? Authorizing Provider  ALPRAZolam Duanne Moron) 0.5 MG tablet Take 1 tablet (0.5 mg total) by mouth 2 (two) times daily as needed for anxiety. 11/21/14  Yes Lauree Chandler, NP  CRESTOR 40 MG tablet TAKE 1 TABLET AT BEDTIME 05/17/14  Yes Lelon Perla, MD  lamoTRIgine (LAMICTAL) 25 MG tablet Take 25 mg by mouth daily.   Yes Historical Provider, MD  LIALDA 1.2 G EC tablet Take 4.8 g by mouth daily. 08/01/14  Yes Historical Provider, MD  metoprolol tartrate (LOPRESSOR) 25 MG tablet Take 1 tablet by mouth 2 (two) times daily. 08/16/14  Yes Historical Provider, MD    mirtazapine (REMERON) 15 MG tablet Take 1 tablet (15 mg total) by mouth at bedtime. 10/17/14  Yes Eugenie Filler, MD  ondansetron (ZOFRAN) 4 MG tablet Take 1 tablet (4 mg total) by mouth every 6 (six) hours as needed for nausea. 10/17/14  Yes Eugenie Filler, MD  oxyCODONE (OXY IR/ROXICODONE) 5 MG immediate release tablet Take one tablet by mouth every six hours for pain 02/05/15  Yes Tiffany L Reed, DO  pantoprazole (PROTONIX) 40 MG tablet Take 1 tablet (40 mg total) by mouth daily at 6 (six) AM. 10/17/14  Yes Eugenie Filler, MD  sertraline (ZOLOFT) 100 MG tablet Take 1 tablet (100 mg total) by mouth daily. 10/17/14  Yes Eugenie Filler, MD  acetaminophen (TYLENOL) 500 MG tablet Take 1 tablet (500 mg total) by mouth every 4 (four) hours as needed for mild pain or moderate pain. Patient taking differently: Take 1,000 mg by mouth every 4 (four) hours as needed for mild pain or moderate pain.  10/17/14   Eugenie Filler, MD  metroNIDAZOLE (FLAGYL) 500 MG tablet Take 500 mg by mouth 3 (three) times daily.    Historical Provider, MD    Scheduled Meds: . cefTRIAXone (ROCEPHIN)  IV  2 g Intravenous Q24H  . collagenase   Topical Daily  . feeding supplement (ENSURE ENLIVE)  237 mL Oral BID BM  . feeding supplement (PRO-STAT SUGAR FREE 64)  30 mL Oral BID WC  . Influenza vac split quadrivalent PF  0.5 mL Intramuscular Tomorrow-1000  . lamoTRIgine  25 mg Oral Daily  . megestrol  40 mg Oral Daily  . mesalamine  4.8 g Oral Daily  . metoprolol tartrate  25 mg Oral BID  . metroNIDAZOLE  500 mg Oral 3 times per day  . mirtazapine  15 mg Oral QHS  . multivitamin with minerals  1 tablet Oral Daily  . pneumococcal 23 valent vaccine  0.5 mL Intramuscular Tomorrow-1000  . rosuvastatin  40 mg Oral QHS  . sertraline  100 mg Oral Daily  . sodium chloride flush  3 mL Intravenous Q12H   Infusions: . sodium chloride 75 mL/hr at 03/28/15 1118   PRN Meds: acetaminophen, ALPRAZolam, ondansetron **OR**  ondansetron (ZOFRAN) IV, oxyCODONE   Allergies as of 03/26/2015 - Review Complete 03/26/2015  Allergen Reaction Noted  . Clindamycin/lincomycin Diarrhea 08/07/2014  . Ivp dye [iodinated diagnostic  agents] Other (See Comments) 01/14/2011    Family History  Problem Relation Age of Onset  . Colitis Brother     1 bro with Crohn's, another bro with unspecified colitis.   . Stomach cancer Mother   . Stroke Father     died  . Colon cancer Neg Hx     Social History   Social History  . Marital Status: Married    Spouse Name: N/A  . Number of Children: N/A  . Years of Education: N/A   Occupational History  . retired    Social History Main Topics  . Smoking status: Former Smoker -- 0.30 packs/day for 40 years    Types: Cigarettes    Quit date: 02/17/2013  . Smokeless tobacco: Never Used  . Alcohol Use: No  . Drug Use: No  . Sexual Activity: Not on file   Other Topics Concern  . Not on file   Social History Narrative    REVIEW OF SYSTEMS: Constitutional:  Weak, mostly bed ridden ENT:  No nose bleeds Pulm:  No dyspnea or cough CV:  No palpitations, no LE edema.  GU:  No hematuria, no frequency GI:  Per HPI.   Heme:  No excessive bleeding or bruising.    Transfusions:  3 PRBCs 08/2014.  Neuro:  No headaches, no peripheral tingling or numbness Derm:  No itching, no rash or sores.  Endocrine:  No sweats or chills.  No polyuria or dysuria Immunization:  Flu shot and pneumovax ordered today, not yet given.  Travel:  None beyond local counties in last few months.    PHYSICAL EXAM: Vital signs in last 24 hours: Filed Vitals:   03/28/15 0648 03/28/15 1100  BP: 109/57 102/59  Pulse: 81 83  Temp: 97.8 F (36.6 C)   Resp: 18 18   Wt Readings from Last 3 Encounters:  03/28/15 76.839 kg (169 lb 6.4 oz)  03/14/15 61.236 kg (135 lb)  03/09/15 77.707 kg (171 lb 5 oz)   General: chronically ill and lethargic.  Uncomfortable when moved.  Head:  No swelling or asymmetry    Eyes:  No pallor or icterus Ears:  Decreased hearing  Nose:  No discharge Mouth:  No bleeding or sores Neck:  No mass or JVD Lungs:  Clear bil but overall diminished BS Heart: RRR.  No MRG.  S1/S2 audible.  Abdomen:  Soft, obese, RLQ ostomy full of watery brown stool. No visible blood in ostomy.  Tender with no guard or rebound in RLQ,   GU: urine not bloody, indwelling foley in place.  Foul smelling, thick, rusty colored, liquid discharge, soiling pt between legs/thighs and into groin.  Even after pt cleaned up by nurse, unable to determine exact source (vaginal, rectal??) Rectal: scant blood in vault, not exactly like the liquid seen in pts thigh and groin.  Sacral decubitus.  Shallow ulcers in buttox/perineal area.   Musc/Skeltl: no joint contractures Extremities:  No CCE  Neurologic:  Alert, oriented x 3.  No tremor.  Moves all 4 limbs but strength not tested.  Skin:  As per rectal exam.  No sores on limbs Tattoos:  none   Psych:  Depressed, somewhat anxious.   Intake/Output from previous day: 02/07 0701 - 02/08 0700 In: 2388.3 [I.V.:1988.3; IV Piggyback:400] Out: 1400 [Urine:400; Stool:1000] Intake/Output this shift: Total I/O In: 120 [P.O.:120] Out: -   LAB RESULTS:  Recent Labs  03/26/15 1609 03/27/15 2300 03/28/15 0530  WBC 9.4 7.9 6.4  HGB 12.9 11.9*  11.7*  HCT 40.1 37.2 35.9*  PLT 209 204 167   BMET Lab Results  Component Value Date   NA 134* 03/28/2015   NA 131* 03/27/2015   NA 121* 03/26/2015   K 3.8 03/28/2015   K 4.0 03/27/2015   K 5.0 03/26/2015   CL 107 03/28/2015   CL 103 03/27/2015   CL 93* 03/26/2015   CO2 17* 03/28/2015   CO2 16* 03/27/2015   CO2 16* 03/26/2015   GLUCOSE 86 03/28/2015   GLUCOSE 80 03/27/2015   GLUCOSE 129* 03/26/2015   BUN 12 03/28/2015   BUN 12 03/27/2015   BUN 20 03/26/2015   CREATININE 0.62 03/28/2015   CREATININE 0.66 03/27/2015   CREATININE 1.07* 03/26/2015   CALCIUM 8.8* 03/28/2015   CALCIUM 9.0 03/27/2015    CALCIUM 9.8 03/26/2015   LFT  Recent Labs  03/26/15 1609 03/27/15 1850  PROT 8.1 7.3  ALBUMIN 3.0* 2.5*  AST 31 28  ALT 12* 12*  ALKPHOS 110 91  BILITOT 0.3 0.2*   PT/INR Lab Results  Component Value Date   INR 1.13 10/10/2014   INR 1.09 09/29/2014   INR 1.00 08/31/2014   Hepatitis Panel No results for input(s): HEPBSAG, HCVAB, HEPAIGM, HEPBIGM in the last 72 hours. C-Diff No components found for: CDIFF Lipase     Component Value Date/Time   LIPASE 43 03/27/2015 1850    Drugs of Abuse  No results found for: LABOPIA, COCAINSCRNUR, LABBENZ, AMPHETMU, THCU, LABBARB   RADIOLOGY STUDIES: Ct Abdomen Pelvis Wo Contrast  03/26/2015  CLINICAL DATA:  Generalized abdominal pain, weakness, loss of appetite. EXAM: CT ABDOMEN AND PELVIS WITHOUT CONTRAST TECHNIQUE: Multidetector CT imaging of the abdomen and pelvis was performed following the standard protocol without IV contrast. COMPARISON:  10/10/2014 FINDINGS: Minimal dependent atelectasis in the lung bases. No effusions. Prior CABG. Heart is normal size. Prior cholecystectomy. Liver, spleen, pancreas, adrenals are unremarkable. Punctate nonobstructing left lower pole renal stone. Small low-density areas within the kidneys bilaterally. No hydronephrosis. Aorta and iliac vessels are densely calcified. No aneurysm. IVC filter noted within the infrarenal IVC. Foley catheter is present in the bladder which is decompressed. Multiple calcifications adjacent to the Foley balloon, likely bladder stones. Right lower quadrant ileostomy, stable. Stomach and large bowel grossly no free fluid, free air or adenopathy. No acute bony abnormality or focal bone lesion. IMPRESSION: No acute findings in the abdomen or pelvis. Heavily calcified aorta and branch vessels.  No aneurysm. Right lower quadrant ileostomy, stable. IVC filter placed. Electronically Signed   By: Rolm Baptise M.D.   On: 03/26/2015 18:16    ENDOSCOPIC STUDIES: [facility] HPI  IMPRESSION:    *  Longstanding hx IBD.  Original dx of UC but after developing fistulizing disease in summer 2016, now felt to have Crohns.   Receiving q 8 week Remicade for several months.  Note 02/2015 Remicade Ab levels were elevated.   Stable but chronic RLQ abdominal pain.  CT 2 days ago shows resolution of pan colitis and abscesses.  MRI in 01/2015 did show perianal fistula but no abscess. Current stool is not bloody but pt pt has malodorous rusty/purulent looking discharge in groin (? Vaginal vs rectal?) and wonder if this is fistulous drainage.   *  Mild elevation of Lipase, resolved.  LFTs normal.  Previous cholecystectomy. Pancreas, biliary tract normal on non-contrast CT.    *  Hyponatremia, resolved  *  Thrombocytopenia.     *  Hx bil LE DVT: s/p IVC  filter.  No anti-coags due to hx GI bleeding.    PLAN:     *  Per Dr Silverio Decamp.    *  Pt has outpt GI fup set for 2/21 with [name], PA-C.  *  Next Remicade due 3/17.  Given high level of Infliximab Ab may want to consider switch to a different biologic.    Azucena Freed  03/28/2015, 12:49 PM Pager: [phone]      Attending physician's note   I have taken a history, examined the patient and reviewed the chart. I agree with the Advanced Practitioner's note, impression and recommendations. 75 year old female with history of Crohn's colitis and anal fistula admitted with weakness and failure to thrive. Noncontrast CT abdomen and pelvis with no significant findings. Of note patient does have elevated antibodies to infliximab with undetectable drug level. We'll have to switch her to either Stelara or Entyvio. We'll arrange for it as outpatient. No leukocytosis, hemoglobin stable.  No change in stool output or rectal pain Encourage by mouth intake as tolerated We will sign off, please call with any questions  K [name] Magnuson, MD [phone] Mon-Fri 8a-5p [phone] after 5p, weekends, holidays

## 2015-03-28 NOTE — Progress Notes (Signed)
240 discharge instruction and prescption given to pt and son Wheled to [employer]

## 2015-03-28 NOTE — Progress Notes (Signed)
CSW provided bed offers to patient's daughter, Donnald Garre. Ramona stated they would choose [facility] at this time and patient will require PTAR at discharge.  Percell Locus Amillion [place] LCSWA [phone]

## 2015-03-29 LAB — CBC WITH DIFFERENTIAL/PLATELET
BASOS PCT: 1 %
Basophils Absolute: 0 10*3/uL (ref 0.0–0.1)
EOS ABS: 0.2 10*3/uL (ref 0.0–0.7)
Eosinophils Relative: 3 %
HCT: 34.6 % — ABNORMAL LOW (ref 36.0–46.0)
HEMOGLOBIN: 11.2 g/dL — AB (ref 12.0–15.0)
LYMPHS ABS: 1.5 10*3/uL (ref 0.7–4.0)
Lymphocytes Relative: 23 %
MCH: 26.4 pg (ref 26.0–34.0)
MCHC: 32.4 g/dL (ref 30.0–36.0)
MCV: 81.4 fL (ref 78.0–100.0)
Monocytes Absolute: 0.6 10*3/uL (ref 0.1–1.0)
Monocytes Relative: 9 %
NEUTROS ABS: 4.2 10*3/uL (ref 1.7–7.7)
NEUTROS PCT: 64 %
Platelets: 168 10*3/uL (ref 150–400)
RBC: 4.25 MIL/uL (ref 3.87–5.11)
RDW: 17.8 % — ABNORMAL HIGH (ref 11.5–15.5)
WBC: 6.6 10*3/uL (ref 4.0–10.5)

## 2015-03-29 LAB — BASIC METABOLIC PANEL
Anion gap: 12 (ref 5–15)
BUN: 9 mg/dL (ref 6–20)
CHLORIDE: 109 mmol/L (ref 101–111)
CO2: 11 mmol/L — ABNORMAL LOW (ref 22–32)
Calcium: 8.4 mg/dL — ABNORMAL LOW (ref 8.9–10.3)
Creatinine, Ser: 0.55 mg/dL (ref 0.44–1.00)
GFR calc non Af Amer: >60 mL/min (ref >60)
Glucose, Bld: 74 mg/dL (ref 65–99)
POTASSIUM: 6 mmol/L — AB (ref 3.5–5.1)
SODIUM: 132 mmol/L — AB (ref 135–145)

## 2015-03-29 LAB — SODIUM, URINE, RANDOM: Sodium, Ur: <10 mmol/L

## 2015-03-29 LAB — OSMOLALITY, URINE: Osmolality, Ur: 294 mOsm/kg — ABNORMAL LOW (ref 300–900)

## 2015-03-29 MED ORDER — MEGESTROL ACETATE 40 MG/ML PO SUSP
400.0000 mg | Freq: Every day | ORAL | Status: DC
  Administered 2015-03-29 – 2015-03-30 (×2): 400 mg via ORAL
  Filled 2015-03-29 (×2): qty 10

## 2015-03-29 MED ORDER — SODIUM POLYSTYRENE SULFONATE 15 GM/60ML PO SUSP
30.0000 g | Freq: Once | ORAL | Status: AC
  Administered 2015-03-29: 30 g via ORAL
  Filled 2015-03-29: qty 120

## 2015-03-29 NOTE — Progress Notes (Signed)
CSW spoke with [place] at [facility]. Indicated that patient was not covered by [facility] (benefits exhausted) and was currently private pay pending Medicaid.   Family is requesting change in facility and will need to ascertain from [facility] if new benefit period would be in effect and new SNF.  Marland KitchenAttempted to reach patient's husband Jeneen Rinks or Donnald Garre  (daughter). Messages left for both to determine placement.  Lorie Phenix. Pauline Good, [place]

## 2015-03-29 NOTE — Progress Notes (Signed)
[facility]                                                                              Patient Demographics  [name], is a 75 y.o. female, DOB - Feb 16, 1941, [id number]  Admit date - 03/26/2015   Admitting Physician Reubin Milan, MD  Outpatient Primary MD for the patient is Kandice Hams, MD  LOS - 3   Chief Complaint  Patient presents with  . Failure To Thrive  . Weakness  . Abdominal Pain       Brief HPI   Per Dr. Olevia Bowens on 2/6 [name] is a 75 y.o. female with a past medical history of Crohn's disease, admitted multiple times last year between June and August due to colitis exacerbation and underwent I&D for rectal abscess and fistula, underwent ileostomy on 09/13/2014, readmitted on 09/29/2014 and 10/10/2014 for GI bleed, CAD, CABG, CVA, hyperlipidemia, hypertension, hypothyroidism, GERD, depression, DVT, status post IVC filter placement on 08/30/2014,  resident at [facility] rehabilitation facility presented to ED with complaints of weakness and abdominal pain after following with Dr. Barry Dienes at her office on 2/6 Per daughter, for the past 2 weeks the patient has been feeling progressively weaker, decreased appetite and persistent chills. She complains of rectal area pain since last week. She has had sharp abdominal pain for the past 3 days associated with mild nausea, no increase in output of fluid, but increase gas has been noticed in the ileostomy bag. She also has had mild abdominal distention. She denied chest pain, palpitations, dizziness, diaphoresis, pitting edema lower extremities.  Workup in the emergency department shows hyponatremia 121, mild elevation in lipase, mild hypomagnesemia. CT scan of the abdomen does not show any acute abnormalities.    Assessment & Plan    Principal Problem:   Hyponatremia- resolved, sodium 121 at the time of admission, likely due to dehydration, patient has not been eating, per family at all, due to  lack of appetite - UA showed ketones, patient was placed on IV fluid hydration - Sodium improving - Serum osm 273, urine osm 294, UNA less than 10 - Started on Megace, appetite stimulant after talking to the family. Family was requesting PEG tube, however after discussing with the patient she is not interested in PEG tube. If no significant improvement with the Megace and patient does not eat, will need palliative goals of care. Nutrition consult also placed.  Active Problems: Urinary tract infection - Continue IV Rocephin, follow urine culture and sensitivities  Hyperkalemia - Kayexalate 1    Essential hypertension - BP currently borderline, continue IV fluids    General weakness likely due to significant hyponatremia - PTOT evaluation-> recommended skilled nursing facility  Abdominal pain, hx of Inflammatory bowel disease, history of rectal fistula: Patient reports chronic abdominal pain, no worsening - CT abdomen and pelvis showed no acute findings, no abscess -  Patient has a long-standing history of ulcerative colitis, was admitted 8/16 with acute ulcerative colitis and rectal bleeding, CT had shown pancolitis. Patient was seen in 12/16 by Dr. 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 - continue  IVF, continue IV Rocephin and Flagyl - Lipase normal, LFTs normal, will consult gastroenterology if worsening abdominal pain or fevers or chills - Appreciate GI recommendations, recommended to encourage oral diet. Outpatient GI follow-up, GI  recommended they will switch her to either [facility] or [facility] outpatient.     Pressure ulcer, sacral, stage III - Wound care following  Code Status: Full CODE STATUS  Family Communication: Discussed in detail with the patient, all imaging results, lab results explained to the patient. I discussed in detail with patient's family yesterday at the bedside.    Disposition Plan: Skilled nursing  facility  Time Spent in minutes   25 minutes  Procedures  CT abdomen and pelvis  Consults   Gastroenterology  DVT Prophylaxis SCD's  Medications  Scheduled Meds: . cefTRIAXone (ROCEPHIN)  IV  2 g Intravenous Q24H  . collagenase   Topical Daily  . feeding supplement (ENSURE ENLIVE)  237 mL Oral BID BM  . feeding supplement (PRO-STAT SUGAR FREE 64)  30 mL Oral BID WC  . Influenza vac split quadrivalent PF  0.5 mL Intramuscular Tomorrow-1000  . lamoTRIgine  25 mg Oral Daily  . megestrol  40 mg Oral Daily  . mesalamine  4.8 g Oral Daily  . metoprolol tartrate  25 mg Oral BID  . metroNIDAZOLE  500 mg Oral 3 times per day  . mirtazapine  15 mg Oral QHS  . multivitamin with minerals  1 tablet Oral Daily  . pneumococcal 23 valent vaccine  0.5 mL Intramuscular Tomorrow-1000  . rosuvastatin  40 mg Oral QHS  . sertraline  100 mg Oral Daily  . sodium chloride flush  3 mL Intravenous Q12H   Continuous Infusions: . sodium chloride 75 mL/hr at 03/28/15 2202   PRN Meds:.acetaminophen, ALPRAZolam, ondansetron **OR** ondansetron (ZOFRAN) IV, oxyCODONE   Antibiotics   Anti-infectives    Start     Dose/Rate Route Frequency Ordered Stop   03/28/15 1400  metroNIDAZOLE (FLAGYL) tablet 500 mg     500 mg Oral 3 times per day 03/28/15 1057     03/26/15 2200  metroNIDAZOLE (FLAGYL) IVPB 500 mg  Status:  Discontinued     500 mg 100 mL/hr over 60 Minutes Intravenous Every 8 hours 03/26/15 2107 03/28/15 1057   03/26/15 2115  cefTRIAXone (ROCEPHIN) 2 g in dextrose 5 % 50 mL IVPB     2 g 100 mL/hr over 30 Minutes Intravenous Every 24 hours 03/26/15 2107          Subjective:   [name] was seen and examined today. Feeling somewhat better however appetite still poor, states she did eat Little for dinner last night. No fevers or chills, states chronic abdominal pain, no worsening. Patient denies dizziness, chest pain, shortness of breath. No acute events overnight.    Objective:   Blood  pressure 106/60, pulse 78, temperature 97.7 F (36.5 C), temperature source Oral, resp. rate 16, weight 76.839 kg (169 lb 6.4 oz), SpO2 98 %.  Wt Readings from Last 3 Encounters:  03/28/15 76.839 kg (169 lb 6.4 oz)  03/14/15 61.236 kg (135 lb)  03/09/15 77.707 kg (171 lb 5 oz)     Intake/Output Summary (Last 24 hours) at 03/29/15 1050 Last data filed at 03/29/15 1033  Gross per 24 hour  Intake    290 ml  Output   1900 ml  Net  -1610 ml    Exam  General: Alert and oriented x 3, NAD  HEENT:  PERRLA, EOMI, Anicteric Sclera  Neck: Supple, no JVD, no masses  CVS: S1 S2 clear, RRR  Respiratory: CTAB  Abdomen: Soft, ileostomy bag in place, mild epigastric tenderness (per patient chronic)  Ext: no cyanosis clubbing or edema  Neuro: generalized weakness, poor effort,  Skin: No rashes  Psych: Normal affect and demeanor, alert and oriented x3    Data Review   Micro Results Recent Results (from the past 240 hour(s))  MRSA PCR Screening     Status: None   Collection Time: 03/27/15  7:46 AM  Result Value Ref Range Status   MRSA by PCR NEGATIVE NEGATIVE Final    Comment:        The GeneXpert MRSA Assay (FDA approved for NASAL specimens only), is one component of a comprehensive MRSA colonization surveillance program. It is not intended to diagnose MRSA infection nor to guide or monitor treatment for MRSA infections.     Radiology Reports Ct Abdomen Pelvis Wo Contrast  03/26/2015  CLINICAL DATA:  Generalized abdominal pain, weakness, loss of appetite. EXAM: CT ABDOMEN AND PELVIS WITHOUT CONTRAST TECHNIQUE: Multidetector CT imaging of the abdomen and pelvis was performed following the standard protocol without IV contrast. COMPARISON:  10/10/2014 FINDINGS: Minimal dependent atelectasis in the lung bases. No effusions. Prior CABG. Heart is normal size. Prior cholecystectomy. Liver, spleen, pancreas, adrenals are unremarkable. Punctate nonobstructing left lower pole renal  stone. Small low-density areas within the kidneys bilaterally. No hydronephrosis. Aorta and iliac vessels are densely calcified. No aneurysm. IVC filter noted within the infrarenal IVC. Foley catheter is present in the bladder which is decompressed. Multiple calcifications adjacent to the Foley balloon, likely bladder stones. Right lower quadrant ileostomy, stable. Stomach and large bowel grossly no free fluid, free air or adenopathy. No acute bony abnormality or focal bone lesion. IMPRESSION: No acute findings in the abdomen or pelvis. Heavily calcified aorta and branch vessels.  No aneurysm. Right lower quadrant ileostomy, stable. IVC filter placed. Electronically Signed   By: Rolm Baptise M.D.   On: 03/26/2015 18:16   Dg Op Swallowing Func-medicare/speech Path  03/12/2015  CLINICAL DATA:  75 year old female with difficulty swallowing. EXAM: MODIFIED BARIUM SWALLOW TECHNIQUE: Different consistencies of barium were administered orally to the patient by the Speech Pathologist. Imaging of the pharynx was performed in the lateral projection. FLUOROSCOPY TIME:  Radiation Exposure Index (as provided by the fluoroscopic device): If the device does not provide the exposure index: Fluoroscopy Time:  2 minutes 50 seconds Number of Acquired Images:  None COMPARISON:  Chest radiographs 08/12/2013 FINDINGS: Thin liquid- delayed oral transit with premature spill and delayed swallow trigger. With thin barium filling the vallecula the patient had to be reminded multiple times to swallow. Nectar thick liquid- not given Honey- not given Pure- delayed oral transit, mildly delayed swallow trigger. Cracker-delayed oral transit and delayed swallow trigger. Barium tablet with puree - within normal limits. A initial attempt to swallow the barium tablet with thin barium was unsuccessful. Barium tablet - mildly slow transit of barium tablet through the esophagus, but ultimately passed to the gastroesophageal junction without evidence  of obstruction. Esophageal caliber appears normal. IMPRESSION: Dominant findings of delayed oral transit, premature spill contrast and delayed swallow trigger. No penetration or aspiration. Please refer to the Speech Pathologists report for complete details and recommendations. Electronically Signed   By: Genevie Ann M.D.   On: 03/12/2015 11:11    CBC  Recent Labs Lab 03/26/15 1609 03/27/15 2300 03/28/15 0530 03/29/15 0715  WBC 9.4 7.9 6.4 6.6  HGB  12.9 11.9* 11.7* 11.2*  HCT 40.1 37.2 35.9* 34.6*  PLT 209 204 167 168  MCV 82.0 81.6 82.3 81.4  MCH 26.4 26.1 26.8 26.4  MCHC 32.2 32.0 32.6 32.4  RDW 17.2* 17.4* 17.5* 17.8*  LYMPHSABS 1.1 1.3 1.7 1.5  MONOABS 0.5 0.6 0.6 0.6  EOSABS 0.0 0.1 0.1 0.2  BASOSABS 0.0 0.0 0.0 0.0    Chemistries   Recent Labs Lab 03/26/15 1609 03/26/15 2219 03/27/15 1850 03/28/15 0530 03/29/15 0715  NA 121*  --  131* 134* 132*  K 5.0  --  4.0 3.8 6.0*  CL 93*  --  103 107 109  CO2 16*  --  16* 17* 11*  GLUCOSE 129*  --  80 86 74  BUN 20  --  12 12 9   CREATININE 1.07*  --  0.66 0.62 0.55  CALCIUM 9.8  --  9.0 8.8* 8.4*  MG  --  1.6*  --   --   --   AST 31  --  28  --   --   ALT 12*  --  12*  --   --   ALKPHOS 110  --  91  --   --   BILITOT 0.3  --  0.2*  --   --    ------------------------------------------------------------------------------------------------------------------ estimated creatinine clearance is 63.7 mL/min (by C-G formula based on Cr of 0.55). ------------------------------------------------------------------------------------------------------------------ No results for input(s): HGBA1C in the last 72 hours. ------------------------------------------------------------------------------------------------------------------ No results for input(s): CHOL, HDL, LDLCALC, TRIG, CHOLHDL, LDLDIRECT in the last 72  hours. ------------------------------------------------------------------------------------------------------------------  Recent Labs  03/27/15 1850  TSH 0.617   ------------------------------------------------------------------------------------------------------------------ No results for input(s): VITAMINB12, FOLATE, FERRITIN, TIBC, IRON, RETICCTPCT in the last 72 hours.  Coagulation profile No results for input(s): INR, PROTIME in the last 168 hours.  No results for input(s): DDIMER in the last 72 hours.  Cardiac Enzymes No results for input(s): CKMB, TROPONINI, MYOGLOBIN in the last 168 hours.  Invalid input(s): CK ------------------------------------------------------------------------------------------------------------------ Invalid input(s): POCBNP  No results for input(s): GLUCAP in the last 72 hours.   [name] M.D. [facility] 03/29/2015, 10:50 AM  Pager: [phone] Between 7am to 7pm - call Pager - [phone]  After 7pm go to [email or web address] - password [email or web address]  Call night coverage person covering after 7pm

## 2015-03-29 NOTE — Clinical Social Work Placement (Signed)
   CLINICAL SOCIAL WORK PLACEMENT  NOTE  Date:  03/29/2015  Patient Details  Name: [name] MRN: [id number] Date of Birth: Jun 30, 1940  Clinical Social Work is seeking post-discharge placement for this patient at the [place] level of care (*CSW will initial, date and re-position this form in  chart as items are completed):  Yes   Patient/family provided with [place] Work Department's list of facilities offering this level of care within the geographic area requested by the patient (or if unable, by the patient's family).  Yes   Patient/family informed of their freedom to choose among providers that offer the needed level of care, that participate in Medicare, Medicaid or managed care program needed by the patient, have an available bed and are willing to accept the patient.  Yes   Patient/family informed of [facility]'s ownership interest in [facility] and [facility], as well as of the fact that they are under no obligation to receive care at these facilities.  PASRR submitted to EDS on       PASRR number received on       Existing PASRR number confirmed on 03/28/15     FL2 transmitted to all facilities in geographic area requested by pt/family on 03/28/15     FL2 transmitted to all facilities within larger geographic area on       Patient informed that his/her managed care company has contracts with or will negotiate with certain facilities, including the following:   [facility], [facility])         Patient/family informed of bed offers received.  Patient chooses bed at       Physician recommends and patient chooses bed at      Patient to be transferred to   on  .  Patient to be transferred to facility by       Patient family notified on   of transfer.  Name of family member notified:        PHYSICIAN Please prepare priority discharge summary, including medications, Please sign FL2, Please prepare prescriptions      Additional Comment:    _______________________________________________ Williemae Area, LCSW 03/29/2015, 11:57 AM

## 2015-03-29 NOTE — Care Management Important Message (Signed)
Important Message  Patient Details  Name: FELITA BRANON MRN: [id number] Date of Birth: 08-Feb-1941   Medicare Important Message Given:  Yes    Maylon Sailors, Leroy Sea 03/29/2015, 8:14 AM

## 2015-03-29 NOTE — Progress Notes (Signed)
CSW spoke with daughter Zanita Redbird. Notified of potential d/c tomorrow if stable per MD to [facility].  OK per daughter who indicated that due to prior obligations (court and MD appointment in [place])- she may not be available tomorrow afternoon to sign admit paperwork but could give verbal permission via phone and sign admit papers later.  Will follow up with facility in the morning re: this. Lorie Phenix. Pauline Good, [place]

## 2015-03-30 DIAGNOSIS — R531 Weakness: Secondary | ICD-10-CM

## 2015-03-30 DIAGNOSIS — I10 Essential (primary) hypertension: Secondary | ICD-10-CM

## 2015-03-30 LAB — CBC WITH DIFFERENTIAL/PLATELET
BASOS ABS: 0 10*3/uL (ref 0.0–0.1)
BASOS PCT: 1 %
EOS ABS: 0.1 10*3/uL (ref 0.0–0.7)
Eosinophils Relative: 2 %
HEMATOCRIT: 36.1 % (ref 36.0–46.0)
HEMOGLOBIN: 11.2 g/dL — AB (ref 12.0–15.0)
Lymphocytes Relative: 28 %
Lymphs Abs: 1.6 10*3/uL (ref 0.7–4.0)
MCH: 25.6 pg — ABNORMAL LOW (ref 26.0–34.0)
MCHC: 31 g/dL (ref 30.0–36.0)
MCV: 82.4 fL (ref 78.0–100.0)
MONOS PCT: 11 %
Monocytes Absolute: 0.6 10*3/uL (ref 0.1–1.0)
NEUTROS ABS: 3.4 10*3/uL (ref 1.7–7.7)
NEUTROS PCT: 58 %
Platelets: 174 10*3/uL (ref 150–400)
RBC: 4.38 MIL/uL (ref 3.87–5.11)
RDW: 17.9 % — ABNORMAL HIGH (ref 11.5–15.5)
WBC: 5.7 10*3/uL (ref 4.0–10.5)

## 2015-03-30 LAB — BASIC METABOLIC PANEL
ANION GAP: 9 (ref 5–15)
BUN: 8 mg/dL (ref 6–20)
CALCIUM: 8.7 mg/dL — AB (ref 8.9–10.3)
CHLORIDE: 107 mmol/L (ref 101–111)
CO2: 17 mmol/L — AB (ref 22–32)
Creatinine, Ser: 0.59 mg/dL (ref 0.44–1.00)
GFR calc non Af Amer: >60 mL/min (ref >60)
Glucose, Bld: 68 mg/dL (ref 65–99)
POTASSIUM: 3.5 mmol/L (ref 3.5–5.1)
SODIUM: 133 mmol/L — AB (ref 135–145)

## 2015-03-30 MED ORDER — MEGESTROL ACETATE 40 MG/ML PO SUSP: 400.0000 mg | Freq: Every day | ORAL | Status: AC

## 2015-03-30 MED ORDER — ALPRAZOLAM 0.5 MG PO TABS: 0.5000 mg | ORAL_TABLET | Freq: Two times a day (BID) | ORAL | Status: AC | PRN

## 2015-03-30 MED ORDER — OXYCODONE HCL 5 MG PO TABS: ORAL_TABLET | ORAL | Status: AC

## 2015-03-30 NOTE — Progress Notes (Addendum)
Pt transferred to: [facility] Anticipated date of transfer: 03/30/15 Transported by: EMS Time Tentatively Scheduled for: 1:30 PM Family notified: Daughter:  Elvita Robie Report # given to nursing to call report and DC summary sent to facility for review.  CSW left message for daughter via v-mail however she was aware yesterday of d/c plan.  Patient is agreeable to [facility].  Notified admissions at [facility] that patient would not be returning to facility.  Daughter indicated that Medicaid has been "approved".  CSW left message for Medicaid worker- Roberts Gaudy at [facility].  Placement authorized by Sharyn Lull at [facility].  No further CSW needs identified.  CSW signing off.  Kendell Bane, LCSW [phone]

## 2015-03-30 NOTE — Clinical Social Work Placement (Signed)
   CLINICAL SOCIAL WORK PLACEMENT  NOTE  Date:  03/30/2015  Patient Details  Name: [name] MRN: [id number] Date of Birth: 17-Oct-1940  Clinical Social Work is seeking post-discharge placement for this patient at the [place] level of care (*CSW will initial, date and re-position this form in  chart as items are completed):  Yes   Patient/family provided with [place] Work Department's list of facilities offering this level of care within the geographic area requested by the patient (or if unable, by the patient's family).  Yes   Patient/family informed of their freedom to choose among providers that offer the needed level of care, that participate in Medicare, Medicaid or managed care program needed by the patient, have an available bed and are willing to accept the patient.  Yes   Patient/family informed of [facility]'s ownership interest in [facility] and [facility], as well as of the fact that they are under no obligation to receive care at these facilities.  PASRR submitted to EDS on       PASRR number received on       Existing PASRR number confirmed on 03/28/15     FL2 transmitted to all facilities in geographic area requested by pt/family on 03/28/15     FL2 transmitted to all facilities within larger geographic area on       Patient informed that his/her managed care company has contracts with or will negotiate with certain facilities, including the following:   [facility] Medicare- requires prior authoization        2/10 [place] for d/c to [facility] per [facility]- she will fax auth to facility)     Yes   Patient/family informed of bed offers received.  Patient chooses bed at [facility]     Physician recommends and patient chooses bed at      Patient to be transferred to [facility] on 03/30/15.  Patient to be transferred to facility by Amb[facility])     Patient family notified on 03/30/15 of  transfer.  Name of family member notified:  Daughter- Anasophia Minero-  v-mail message left re: d/c. Spoke to her via phone 03/28/14     PHYSICIAN Please prepare priority discharge summary, including medications, Please sign FL2, Please prepare prescriptions     Additional Comment:    _______________________________________________ Williemae Area, LCSW 03/30/2015, 11:28 AM

## 2015-03-30 NOTE — Discharge Summary (Signed)
Physician Discharge Summary  [name] [id number] DOB: 1940-07-20 DOA: 03/26/2015  PCP: Kandice Hams, MD  Admit date: 03/26/2015 Discharge date: 03/30/2015  Time spent:45 minutes  Recommendations for Outpatient Follow-up:  1. Dr.Steven [facility]bruster [facility] in 2 weeks 2. PCP Dr.Polite in 1 week, consider initiating Goals of Care discussions 3. Wound Care for Sacral decub stage 3   Discharge Diagnoses:  Principal Problem:   Hyponatremia   Chronic Abd pain   Anorexia   Essential hypertension   General weakness   Rectal fistula   Inflammatory bowel disease   Pressure ulcer   H/o Perirectal abscess   Hypomagnesemia   Ileostomy  Discharge Condition: stable  Diet recommendation: Carb modified diet  Filed Weights   03/26/15 2108 03/27/15 0550 03/28/15 0648  Weight: 75.025 kg (165 lb 6.4 oz) 73.211 kg (161 lb 6.4 oz) 76.839 kg (169 lb 6.4 oz)    History of present illness:  Chief Complaint: Weakness and abdominal pain.  HPI: [name] is a 75 y.o. female with a past medical history of Crohn's disease, admitted multiple times last year between June and August due to colitis exacerbation and underwent I&D for rectal abscess and fistula, underwent ileostomy on 09/13/2014, readmitted on 09/29/2014 and 10/10/2014 for GI bleed, CAD, CABG, CVA, hyperlipidemia, hypertension, hypothyroidism, GERD, depression, DVT, status post IVC filter placement on 08/30/2014 who is currently a resident at [facility] rehabilitation facility. Her daughter stated that for the past 2 weeks the patient has been feeling progressively weaker, has decreased appetite and persistent chills. She complains of rectum area pain since last week. She denies fever, night sweats or rigors, but complains of severe fatigue and inability to ambulate by herself. She has had sharp abdominal pain for the past 3 days associated with mild nausea. Workup in the emergency department shows hyponatremia 121 mmol/L, mild elevation in  lipase, mild hypomagnesemia. CT scan of the abdomen does not show any acute abnormalities  Hospital Course:  Hyponatremia - resolved, sodium 121 at the time of admission, likely due to dehydration, patient has not been eating, per family at all, due to lack of appetite - UA showed ketones, patient was placed on IV fluid hydration - Sodium improving - Serum osm 273, urine osm 294, UNa less than 10 - Started on Megace by Dr.Rai yesterday, appetite stimulant after talking to the family.  -Family was requesting PEG tube, however after discussing with the patient she is not interested in PEG tube.  -modest improvement noted in PO intake -if re-admitted with same and continues to have Failure to thrive would recommend Palliative Consult, she's she is eating a little better now, i have not initiated this  Asymptomatic bacteruria -no symptoms, no fevers or leukocytosis -received 3days of IV ceftraixone, I stopped Abx today  Hyperkalemia -due to AKI -resolved - Kayexalate 1 given on admission   Essential hypertension - Bp stable and improved   General weakness likely due to significant hyponatremia, malnutrition, weight loss, debility - PTOT evaluation-> recommended skilled nursing facility  Abdominal pain, hx of Inflammatory bowel disease, history of rectal fistula: -Patient reports chronic abdominal pain, no worsening - CT abdomen and pelvis showed no acute findings, no abscess - Patient has a long-standing history of ulcerative colitis, was admitted 8/16 with acute ulcerative colitis and rectal bleeding, CT had shown pancolitis. Patient was seen in 12/16 by Dr. 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 - she was started on empiric Abx for possible colitis, this  was stopped  - Lipase normal, LFTs normal, Seen by GI, Dr.nandigam in consultation, her IBD was felt to be stable, recommended to encourage oral diet. Outpatient GI  follow-up, GI recommended they will probably switch her to either Stelara or Entyvio as outpatient.    Pressure ulcer, sacral, stage III - Wound care   Consultations:  GI  Discharge Exam: Filed Vitals:   03/29/15 2043 03/30/15 0528  BP: 104/64 110/59  Pulse: 93 90  Temp: 98.1 F (36.7 C) 98.7 F (37.1 C)  Resp: 17 18    General: AAOx3 Cardiovascular: S1S2/RRR Respiratory: CTAB  Discharge Instructions   Discharge Instructions    Diet - low sodium heart healthy    Complete by:  As directed      Increase activity slowly    Complete by:  As directed           Current Discharge Medication List    START taking these medications   Details  megestrol (MEGACE) 40 MG/ML suspension Take 10 mLs (400 mg total) by mouth daily. Qty: 240 mL, Refills: 0      CONTINUE these medications which have CHANGED   Details  ALPRAZolam (XANAX) 0.5 MG tablet Take 1 tablet (0.5 mg total) by mouth 2 (two) times daily as needed for anxiety. Qty: 10 tablet, Refills: 0    oxyCODONE (OXY IR/ROXICODONE) 5 MG immediate release tablet Take one tablet by mouth every six hours for pain Qty: 20 tablet, Refills: 0      CONTINUE these medications which have NOT CHANGED   Details  CRESTOR 40 MG tablet TAKE 1 TABLET AT BEDTIME Qty: 90 tablet, Refills: 2    lamoTRIgine (LAMICTAL) 25 MG tablet Take 25 mg by mouth daily.    LIALDA 1.2 G EC tablet Take 4.8 g by mouth daily. Refills: 0    metoprolol tartrate (LOPRESSOR) 25 MG tablet Take 1 tablet by mouth 2 (two) times daily. Refills: 0    mirtazapine (REMERON) 15 MG tablet Take 1 tablet (15 mg total) by mouth at bedtime. Qty: 30 tablet, Refills: 0    ondansetron (ZOFRAN) 4 MG tablet Take 1 tablet (4 mg total) by mouth every 6 (six) hours as needed for nausea. Qty: 20 tablet, Refills: 0    pantoprazole (PROTONIX) 40 MG tablet Take 1 tablet (40 mg total) by mouth daily at 6 (six) AM. Qty: 30 tablet, Refills: 0    sertraline (ZOLOFT) 100 MG  tablet Take 1 tablet (100 mg total) by mouth daily. Qty: 30 tablet, Refills: 0    acetaminophen (TYLENOL) 500 MG tablet Take 1 tablet (500 mg total) by mouth every 4 (four) hours as needed for mild pain or moderate pain. Qty: 30 tablet, Refills: 0      STOP taking these medications     metroNIDAZOLE (FLAGYL) 500 MG tablet        Allergies  Allergen Reactions  . Clindamycin/Lincomycin Diarrhea    Leads to colitis flare  . Ivp Dye [Iodinated Diagnostic Agents] Other (See Comments)    "almost passed out"      The results of significant diagnostics from this hospitalization (including imaging, microbiology, ancillary and laboratory) are listed below for reference.    Significant Diagnostic Studies: Ct Abdomen Pelvis Wo Contrast  03/26/2015  CLINICAL DATA:  Generalized abdominal pain, weakness, loss of appetite. EXAM: CT ABDOMEN AND PELVIS WITHOUT CONTRAST TECHNIQUE: Multidetector CT imaging of the abdomen and pelvis was performed following the standard protocol without IV contrast. COMPARISON:  10/10/2014 FINDINGS:  Minimal dependent atelectasis in the lung bases. No effusions. Prior CABG. Heart is normal size. Prior cholecystectomy. Liver, spleen, pancreas, adrenals are unremarkable. Punctate nonobstructing left lower pole renal stone. Small low-density areas within the kidneys bilaterally. No hydronephrosis. Aorta and iliac vessels are densely calcified. No aneurysm. IVC filter noted within the infrarenal IVC. Foley catheter is present in the bladder which is decompressed. Multiple calcifications adjacent to the Foley balloon, likely bladder stones. Right lower quadrant ileostomy, stable. Stomach and large bowel grossly no free fluid, free air or adenopathy. No acute bony abnormality or focal bone lesion. IMPRESSION: No acute findings in the abdomen or pelvis. Heavily calcified aorta and branch vessels.  No aneurysm. Right lower quadrant ileostomy, stable. IVC filter placed. Electronically  Signed   By: Rolm Baptise M.D.   On: 03/26/2015 18:16   Dg Op Swallowing Func-medicare/speech Path  03/12/2015  CLINICAL DATA:  75 year old female with difficulty swallowing. EXAM: MODIFIED BARIUM SWALLOW TECHNIQUE: Different consistencies of barium were administered orally to the patient by the Speech Pathologist. Imaging of the pharynx was performed in the lateral projection. FLUOROSCOPY TIME:  Radiation Exposure Index (as provided by the fluoroscopic device): If the device does not provide the exposure index: Fluoroscopy Time:  2 minutes 50 seconds Number of Acquired Images:  None COMPARISON:  Chest radiographs 08/12/2013 FINDINGS: Thin liquid- delayed oral transit with premature spill and delayed swallow trigger. With thin barium filling the vallecula the patient had to be reminded multiple times to swallow. Nectar thick liquid- not given Honey- not given Pure- delayed oral transit, mildly delayed swallow trigger. Cracker-delayed oral transit and delayed swallow trigger. Barium tablet with puree - within normal limits. A initial attempt to swallow the barium tablet with thin barium was unsuccessful. Barium tablet - mildly slow transit of barium tablet through the esophagus, but ultimately passed to the gastroesophageal junction without evidence of obstruction. Esophageal caliber appears normal. IMPRESSION: Dominant findings of delayed oral transit, premature spill contrast and delayed swallow trigger. No penetration or aspiration. Please refer to the Speech Pathologists report for complete details and recommendations. Electronically Signed   By: Genevie Ann M.D.   On: 03/12/2015 11:11    Microbiology: Recent Results (from the past 240 hour(s))  MRSA PCR Screening     Status: None   Collection Time: 03/27/15  7:46 AM  Result Value Ref Range Status   MRSA by PCR NEGATIVE NEGATIVE Final    Comment:        The GeneXpert MRSA Assay (FDA approved for NASAL specimens only), is one component of  a comprehensive MRSA colonization surveillance program. It is not intended to diagnose MRSA infection nor to guide or monitor treatment for MRSA infections.      Labs: Basic Metabolic Panel:  Recent Labs Lab 03/26/15 1609 03/26/15 2219 03/27/15 1850 03/28/15 0530 03/29/15 0715 03/30/15 0600  NA 121*  --  131* 134* 132* 133*  K 5.0  --  4.0 3.8 6.0* 3.5  CL 93*  --  103 107 109 107  CO2 16*  --  16* 17* 11* 17*  GLUCOSE 129*  --  80 86 74 68  BUN 20  --  12 12 9 8   CREATININE 1.07*  --  0.66 0.62 0.55 0.59  CALCIUM 9.8  --  9.0 8.8* 8.4* 8.7*  MG  --  1.6*  --   --   --   --   PHOS  --  3.3  --   --   --   --  Liver Function Tests:  Recent Labs Lab 03/26/15 1609 03/27/15 1850  AST 31 28  ALT 12* 12*  ALKPHOS 110 91  BILITOT 0.3 0.2*  PROT 8.1 7.3  ALBUMIN 3.0* 2.5*    Recent Labs Lab 03/26/15 1609 03/27/15 1850  LIPASE 71* 43   No results for input(s): AMMONIA in the last 168 hours. CBC:  Recent Labs Lab 03/26/15 1609 03/27/15 2300 03/28/15 0530 03/29/15 0715 03/30/15 0600  WBC 9.4 7.9 6.4 6.6 5.7  NEUTROABS 7.8* 5.9 4.0 4.2 3.4  HGB 12.9 11.9* 11.7* 11.2* 11.2*  HCT 40.1 37.2 35.9* 34.6* 36.1  MCV 82.0 81.6 82.3 81.4 82.4  PLT 209 204 167 168 174   Cardiac Enzymes: No results for input(s): CKTOTAL, CKMB, CKMBINDEX, TROPONINI in the last 168 hours. BNP: BNP (last 3 results) No results for input(s): BNP in the last 8760 hours.  ProBNP (last 3 results) No results for input(s): PROBNP in the last 8760 hours.  CBG: No results for input(s): GLUCAP in the last 168 hours.     SignedDomenic Polite MD.  [facility] 03/30/2015, 10:26 AM

## 2015-03-30 NOTE — Progress Notes (Signed)
PT Cancellation Note  Patient Details Name: [name] MRN: [id number] DOB: 1940/09/05   Cancelled Treatment:    Reason Eval/Treat Not Completed: Family refusing PT at this time.    Cassell Clement, PT, CSCS Pager [phone] Office [phone]  03/30/2015, 1:49 PM

## 2015-04-02 ENCOUNTER — Telehealth: Payer: Self-pay | Admitting: Gastroenterology

## 2015-04-02 NOTE — Telephone Encounter (Signed)
[name] can you please let this patient know I just received the results from her Remicade trough level. Unfortunately she has developed antibodies to Remicade and has no detectable drug. This means we need to stop Remicade as it is not providing any benefit and could cause harm. I would like to see her in clinic within the next 2 weeks to discuss other options. I am going to recommend starting her on Humira combined with either a thiopurine or (preferably) methotrexate to help minimize immunogenicity and help close the fistula. Please let her know and cancel her next Remicade infusion. I will discuss this further with her in the clinic. Thanks

## 2015-04-03 NOTE — Telephone Encounter (Signed)
Spoke with patient's daughter Donnald Garre and gave her results and recommendations. She states her mother is a [facility] now.  Cancelled Remicade appointments at [facility] short stay.

## 2015-04-06 ENCOUNTER — Other Ambulatory Visit: Payer: Self-pay | Admitting: Radiology

## 2015-04-06 ENCOUNTER — Other Ambulatory Visit (HOSPITAL_COMMUNITY): Payer: Self-pay | Admitting: Internal Medicine

## 2015-04-06 DIAGNOSIS — R633 Feeding difficulties, unspecified: Secondary | ICD-10-CM

## 2015-04-09 ENCOUNTER — Ambulatory Visit (HOSPITAL_COMMUNITY)
Admission: RE | Admit: 2015-04-09 | Discharge: 2015-04-09 | Disposition: A | Discharge status: Home / Self Care | Payer: Commercial Managed Care - HMO | Source: Ambulatory Visit | Attending: Internal Medicine | Admitting: Internal Medicine

## 2015-04-09 ENCOUNTER — Encounter (HOSPITAL_COMMUNITY): Payer: Self-pay

## 2015-04-09 DIAGNOSIS — E119 Type 2 diabetes mellitus without complications: Secondary | ICD-10-CM

## 2015-04-09 DIAGNOSIS — Z881 Allergy status to other antibiotic agents status: Secondary | ICD-10-CM | POA: Insufficient documentation

## 2015-04-09 DIAGNOSIS — I252 Old myocardial infarction: Secondary | ICD-10-CM

## 2015-04-09 DIAGNOSIS — Z91041 Radiographic dye allergy status: Secondary | ICD-10-CM | POA: Insufficient documentation

## 2015-04-09 DIAGNOSIS — I10 Essential (primary) hypertension: Secondary | ICD-10-CM

## 2015-04-09 DIAGNOSIS — I2510 Atherosclerotic heart disease of native coronary artery without angina pectoris: Secondary | ICD-10-CM

## 2015-04-09 DIAGNOSIS — Z86718 Personal history of other venous thrombosis and embolism: Secondary | ICD-10-CM | POA: Insufficient documentation

## 2015-04-09 DIAGNOSIS — R627 Adult failure to thrive: Secondary | ICD-10-CM | POA: Diagnosis not present

## 2015-04-09 DIAGNOSIS — Z6824 Body mass index (BMI) 24.0-24.9, adult: Secondary | ICD-10-CM

## 2015-04-09 DIAGNOSIS — E079 Disorder of thyroid, unspecified: Secondary | ICD-10-CM | POA: Insufficient documentation

## 2015-04-09 DIAGNOSIS — F329 Major depressive disorder, single episode, unspecified: Secondary | ICD-10-CM | POA: Insufficient documentation

## 2015-04-09 DIAGNOSIS — E46 Unspecified protein-calorie malnutrition: Secondary | ICD-10-CM

## 2015-04-09 DIAGNOSIS — E782 Mixed hyperlipidemia: Secondary | ICD-10-CM

## 2015-04-09 DIAGNOSIS — Z932 Ileostomy status: Secondary | ICD-10-CM

## 2015-04-09 DIAGNOSIS — R630 Anorexia: Secondary | ICD-10-CM

## 2015-04-09 DIAGNOSIS — K219 Gastro-esophageal reflux disease without esophagitis: Secondary | ICD-10-CM

## 2015-04-09 DIAGNOSIS — Z8379 Family history of other diseases of the digestive system: Secondary | ICD-10-CM | POA: Insufficient documentation

## 2015-04-09 DIAGNOSIS — R633 Feeding difficulties, unspecified: Secondary | ICD-10-CM

## 2015-04-09 DIAGNOSIS — K5090 Crohn's disease, unspecified, without complications: Secondary | ICD-10-CM | POA: Insufficient documentation

## 2015-04-09 DIAGNOSIS — Z79899 Other long term (current) drug therapy: Secondary | ICD-10-CM | POA: Insufficient documentation

## 2015-04-09 DIAGNOSIS — R109 Unspecified abdominal pain: Secondary | ICD-10-CM | POA: Diagnosis not present

## 2015-04-09 DIAGNOSIS — Z87891 Personal history of nicotine dependence: Secondary | ICD-10-CM | POA: Insufficient documentation

## 2015-04-09 LAB — CBC
HCT: 39.7 % (ref 36.0–46.0)
Hemoglobin: 12.7 g/dL (ref 12.0–15.0)
MCH: 26.5 pg (ref 26.0–34.0)
MCHC: 32 g/dL (ref 30.0–36.0)
MCV: 82.7 fL (ref 78.0–100.0)
PLATELETS: 200 10*3/uL (ref 150–400)
RBC: 4.8 MIL/uL (ref 3.87–5.11)
RDW: 18.4 % — AB (ref 11.5–15.5)
WBC: 7.3 10*3/uL (ref 4.0–10.5)

## 2015-04-09 LAB — PROTIME-INR
INR: 1.2 (ref 0.00–1.49)
PROTHROMBIN TIME: 15.4 s — AB (ref 11.6–15.2)

## 2015-04-09 LAB — BASIC METABOLIC PANEL
ANION GAP: 12 (ref 5–15)
BUN: 11 mg/dL (ref 6–20)
CALCIUM: 9.9 mg/dL (ref 8.9–10.3)
CO2: 17 mmol/L — ABNORMAL LOW (ref 22–32)
Chloride: 100 mmol/L — ABNORMAL LOW (ref 101–111)
Creatinine, Ser: 0.85 mg/dL (ref 0.44–1.00)
GFR calc Af Amer: >60 mL/min (ref >60)
GLUCOSE: 97 mg/dL (ref 65–99)
POTASSIUM: 4.3 mmol/L (ref 3.5–5.1)
SODIUM: 129 mmol/L — AB (ref 135–145)

## 2015-04-09 LAB — APTT: APTT: 31 s (ref 24–37)

## 2015-04-09 MED ORDER — IOHEXOL 300 MG/ML  SOLN
50.0000 mL | Freq: Once | INTRAMUSCULAR | Status: AC | PRN
  Administered 2015-04-09: 50 mL via INTRAVENOUS

## 2015-04-09 MED ORDER — MORPHINE SULFATE (PF) 2 MG/ML IV SOLN
2.0000 mg | Freq: Once | INTRAVENOUS | Status: AC
  Administered 2015-04-09: 2 mg via INTRAVENOUS

## 2015-04-09 MED ORDER — MORPHINE SULFATE (PF) 2 MG/ML IV SOLN
INTRAVENOUS | Status: AC
  Filled 2015-04-09: qty 1

## 2015-04-09 MED ORDER — MIDAZOLAM HCL 2 MG/2ML IJ SOLN
INTRAMUSCULAR | Status: AC | PRN
  Administered 2015-04-09: 0.5 mg via INTRAVENOUS

## 2015-04-09 MED ORDER — BACITRACIN-NEOMYCIN-POLYMYXIN 400-5-5000 EX OINT: 1.0000 "application " | TOPICAL_OINTMENT | Freq: Every day | CUTANEOUS | Status: DC

## 2015-04-09 MED ORDER — CEFAZOLIN SODIUM-DEXTROSE 2-3 GM-% IV SOLR
2.0000 g | INTRAVENOUS | Status: AC
  Administered 2015-04-09: 2 g via INTRAVENOUS

## 2015-04-09 MED ORDER — FENTANYL CITRATE (PF) 100 MCG/2ML IJ SOLN
INTRAMUSCULAR | Status: AC
  Filled 2015-04-09: qty 2

## 2015-04-09 MED ORDER — LIDOCAINE HCL 1 % IJ SOLN
INTRAMUSCULAR | Status: AC
  Filled 2015-04-09: qty 20

## 2015-04-09 MED ORDER — HYDROCODONE-ACETAMINOPHEN 5-325 MG PO TABS: 1.0000 | ORAL_TABLET | ORAL | Status: DC | PRN

## 2015-04-09 MED ORDER — FENTANYL CITRATE (PF) 100 MCG/2ML IJ SOLN
INTRAMUSCULAR | Status: AC | PRN
  Administered 2015-04-09: 12.5 ug via INTRAVENOUS
  Administered 2015-04-09: 25 ug via INTRAVENOUS

## 2015-04-09 MED ORDER — HYDROCODONE-ACETAMINOPHEN 5-325 MG PO TABS
1.0000 | ORAL_TABLET | ORAL | Status: AC | PRN
Start: 2015-04-09 — End: ?

## 2015-04-09 MED ORDER — CEFAZOLIN SODIUM-DEXTROSE 2-3 GM-% IV SOLR
INTRAVENOUS | Status: AC
  Filled 2015-04-09: qty 50

## 2015-04-09 MED ORDER — MIDAZOLAM HCL 2 MG/2ML IJ SOLN
INTRAMUSCULAR | Status: AC
  Filled 2015-04-09: qty 2

## 2015-04-09 MED ORDER — SODIUM CHLORIDE 0.9 % IV SOLN
Freq: Once | INTRAVENOUS | Status: AC
Start: 2015-04-09 — End: 2015-04-09
  Administered 2015-04-09: 14:00:00 via INTRAVENOUS

## 2015-04-09 NOTE — Consult Note (Addendum)
WOC ostomy consult note Stoma type/location: Pt is currently in short stay awaiting a procedure and does not plan to be admitted. Consult requested for leaking  ileostomy assistance.Pt has been in a SNF where she has total assistance with pouch application and emptying, according to the EMR.  Stomal assessment/size: Stoma red, flush with skin level, 1 inch Peristomal assessment: Intact skin surrounding stoma. Output: Mod amt green liquid stool  Ostomy pouching: 1pc convex.  Education provided: Previous pouch was leaking behind the barrier onto patient's skin. Applied barrier ring and convex pouch to maintain seal since stoma is flush with skin level.No family present. 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 Please re-consult if further assistance is needed. Thank-you,  Julien Girt MSN, [place], [place], [place], [place]

## 2015-04-09 NOTE — Procedures (Signed)
Interventional Radiology Procedure Note  Procedure: Placement of percutaneous 73F pull-through gastrostomy tube. Complications: None Recommendations: - NPO except for sips and chips remainder of today and overnight - Outpt, will  DC in 1 hour - May advance diet as tolerated and begin using tube tomorrow morning  Signed,   Dulcy Fanny. Earleen Newport, DO

## 2015-04-09 NOTE — Discharge Instructions (Signed)
°  CLEANSE PEG SITE WITH SOAP AND WATER; DRESS WITH TRIPLE ANTIBIOTIC AND COVER DAILY WITH GAUZE FOR 7 DAYS; NOTIFY DR OF ABD PAIN; DISTENTION,VOMITING, [place] of a Feeding Tube Feeding tubes are often given to those who have trouble swallowing or cannot take food or medicine. A feeding tube can:   Go into the nose and down to the stomach.  Go through the skin in the belly (abdomen) and into the stomach or small bowel. SUPPLIES NEEDED TO CARE FOR THE TUBE SITE  Clean gloves.  Clean wash cloth, gauze pads, or soft paper towel.  Cotton swabs.  Skin barrier ointment or cream.  Soap and water.  Precut foam pads or gauze (that go around the tube).  Tube tape. TUBE SITE CARE 1. Have all supplies ready. 2. Wash hands well. 3.  Put on clean gloves. 4. Remove dirty foam pads or gauze near the tube site, if present. 5. Check the skin around the tube site for redness, rash, puffiness (swelling), leaking fluid, or extra tissue growth. Call your doctor if you see any of these. 6. Wet the gauze and cotton swabs with water and soap. 7. Wipe the area closest to the tube with cotton swabs. Wipe the surrounding skin with moistened gauze. Rinse with water. 8. Dry the skin and tube site with a dry gauze pad or soft paper towel. Do not use antibiotic ointments at the tube site. 9. If the skin is red, apply petroleum jelly in a circular motion, using a cotton swab. Your doctor may suggest a different cream or ointment. Use what the doctor suggests. 10. Apply a new pre-cut foam pad or gauze around the tube. Tape the edges down. Foam pads or gauze may be left off if there is no fluid at the tube site. 11. Use tape or a device that will attach your feeding tube to your skin or do as directed. Rotate where you tape the tube. 12. Sit the person up. 13. Throw away used supplies. 14. Remove gloves. 15. Wash hands. SUPPLIES NEEDED TO FLUSH A FEEDING TUBE  Clean gloves.  60 mL  syringe (that connects to feeding tube).  Towel.  Water. FLUSHING A FEEDING TUBE  1. Have all supplies ready. 2. Wash hands well. 3. Put on clean gloves. 4. Pull 30 mL of water into the syringe. 5. Bend (kink) the feeding tube while disconnecting it from the feeding-bag tubing or while removing the plug at the end of the tube. 6. Insert the tip of the syringe into the end of the feeding tube. Stop bending the tube. Slowly inject the water. 7. If you cannot inject the water, the person with the feeding tube should lay on their left side. 8. After injecting the water, remove the syringe. 9. Always flush the tube before giving the first medicine, between medicines, and after the final medicine before starting a feeding. 10. Throw away used supplies. 11. Remove gloves. 12. Wash hands.   This information is not intended to replace advice given to you by your health care provider. Make sure you discuss any questions you have with your health care provider.   Document Released: 10/29/2011 Document Reviewed: 10/29/2011 Elsevier Interactive Patient Education [employer].

## 2015-04-09 NOTE — H&P (Signed)
Chief Complaint: anorexia Referring Physician:Dr. Garwin Brothers HPI: [name] is an 75 y.o. female with a history of IBD thought to initially be UC, but now felt more likely to be crohn's disease.  She has had a lap diverting ileostomy in 08/2014 to help with healing of rectal fistulas and abscesses.  She has had more issues with her crohn's and is now having difficulty eating.  She is on megace for appetite stimulation, but still won't eat.  A request has been made for a gastrostomy tube to be placed for feeding access.  Past Medical History:  Past Medical History  Diagnosis Date  . CAD (coronary artery disease)     unspecified  . Cerebrovascular disease 2010    bil carotid artery disease. left CEA 02/2004. 02/2014 carotid ultrasound: < 40% bil ICA disease.   Marland Kitchen Hyperlipidemia, mixed   . HTN (hypertension)   . Ulcerative colitis 2002  . Colon polyps 2006, 2014  . Thyroid disease   . Myocardial infarct ([place]) 1998  . Diabetes mellitus without complication (HCC)     Borderline  . GERD (gastroesophageal reflux disease)   . Arthritis   . 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   . Depression 10/12/2014  . DVT (deep venous thrombosis) ([place]) 08/29/14    left LE, s/p 08/30/14 IVC filter.     Past Surgical History:  Past Surgical History  Procedure Laterality Date  . Cholecystectomy  7.06.2008    with cholangiogram  . 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 Abdominal hysterectomy    . Colonoscopy  multiple  . Sigmoidoscopy  multiple  . Flexible sigmoidoscopy N/A 08/08/2014    Procedure: FLEXIBLE SIGMOIDOSCOPY;  Surgeon: Gatha Mayer, MD;  Location: [place];  Service: Endoscopy;  Laterality: N/A;  . Incision and drainage perirectal abscess N/A 08/22/2014    Procedure: IRRIGATION AND DEBRIDEMENT PERIRECTAL ABSCESS;  Surgeon: Jackolyn Confer, MD;  Location: [facility];  Service: General;  Laterality: N/A;  . Joint replacement  left knee  . Flexible sigmoidoscopy N/A 09/06/2014   Procedure: FLEXIBLE SIGMOIDOSCOPY;  Surgeon: Lafayette Dragon, MD;  Location: [facility];  Service: Endoscopy;  Laterality: N/A;  . Laparoscopic diverted colostomy N/A 09/13/2014    Procedure: LAPAROSCOPIC DIVERTED ILEOSTOMY;  Surgeon: Stark Klein, MD;  Location: [facility];  Service: General;  Laterality: N/A;  . Rectal exam under anesthesia N/A 09/13/2014    Procedure: RECTAL EXAM UNDER ANESTHESIA  AND DEBRIDEMENT OF PERIANAL WOUND;  Surgeon: Stark Klein, MD;  Location: [facility];  Service: General;  Laterality: N/A;  . Flexible sigmoidoscopy N/A 10/04/2014    Procedure: Otho Darner SIGMOIDOSCOPY;  Surgeon: Inda Castle, MD;  Location: [place];  Service: Endoscopy;  Laterality: N/A;  . Flexible sigmoidoscopy N/A 10/11/2014    Procedure: FLEXIBLE SIGMOIDOSCOPY;  Surgeon: Irene Shipper, MD;  Location: [place];  Service: Endoscopy;  Laterality: N/A;    Family History:  Family History  Problem Relation Age of Onset  . Colitis Brother     1 bro with Crohn's, another bro with unspecified colitis.   . Stomach cancer Mother   . Stroke Father     died  . Colon cancer Neg Hx     Social History:  reports that she quit smoking about 2 years ago. Her smoking use included Cigarettes. She has a 12 pack-year smoking history. She has never used smokeless tobacco. She reports that she does not drink alcohol or use illicit drugs.  Allergies:  Allergies  Allergen Reactions  . Clindamycin/Lincomycin Diarrhea    Leads to colitis flare  . Ivp Dye [Iodinated Diagnostic Agents] Other (See Comments)    "almost passed out"    Medications:   Medication List    ASK your doctor about these medications        acetaminophen 500 MG tablet  Commonly known as:  TYLENOL  Take 1 tablet (500 mg total) by mouth every 4 (four) hours as needed for mild pain or moderate pain.     ALPRAZolam 0.5 MG tablet  Commonly known as:  XANAX  Take 1 tablet (0.5 mg total) by mouth 2 (two) times daily as needed for anxiety.       CRESTOR 40 MG tablet  Generic drug:  rosuvastatin  TAKE 1 TABLET AT BEDTIME     lamoTRIgine 25 MG tablet  Commonly known as:  LAMICTAL  Take 25 mg by mouth daily.     LIALDA 1.2 g EC tablet  Generic drug:  mesalamine  Take 4.8 g by mouth daily.     megestrol 40 MG/ML suspension  Commonly known as:  MEGACE  Take 10 mLs (400 mg total) by mouth daily.     metoprolol tartrate 25 MG tablet  Commonly known as:  LOPRESSOR  Take 1 tablet by mouth 2 (two) times daily.     mirtazapine 15 MG tablet  Commonly known as:  REMERON  Take 1 tablet (15 mg total) by mouth at bedtime.     ondansetron 4 MG tablet  Commonly known as:  ZOFRAN  Take 1 tablet (4 mg total) by mouth every 6 (six) hours as needed for nausea.     oxyCODONE 5 MG immediate release tablet  Commonly known as:  Oxy IR/ROXICODONE  Take one tablet by mouth every six hours for pain     pantoprazole 40 MG tablet  Commonly known as:  PROTONIX  Take 1 tablet (40 mg total) by mouth daily at 6 (six) AM.     sertraline 100 MG tablet  Commonly known as:  ZOLOFT  Take 1 tablet (100 mg total) by mouth daily.        Please HPI for pertinent positives, otherwise difficult to obtain as patient is intermittently confused.  She does admit to intermittent abdominal pain, but this is chronic apparently.  Mallampati Score: MD Evaluation Airway: WNL Heart: WNL Abdomen: WNL (has RLQ ostomy ) Chest/ Lungs: WNL ASA  Classification: 3 Mallampati/Airway Score: Two  Physical Exam: BP 119/80 mmHg  Pulse 89  Temp(Src) 97.8 F (36.6 C) (Oral)  Resp 18  Ht 5' 9.5" (1.765 m)  Wt 169 lb (76.658 kg)  BMI 24.61 kg/m2  SpO2 100% Body mass index is 24.61 kg/(m^2). General: elderly chronically ill appearing black female who is laying in bed in NAD HEENT: head is normocephalic, atraumatic.  Sclera are noninjected.  PERRL.  Ears and nose without any masses or lesions.  Mouth is pink and she has no teeth. Heart: regular, rate, and  rhythm.  Normal s1,s2. No obvious murmurs, gallops, or rubs noted.  Palpable radial and pedal pulses bilaterally Lungs: CTAB, no wheezes, rhonchi, or rales noted.  Respiratory effort nonlabored Abd: soft, minimally tender, ND, +BS, no masses, hernias, or organomegaly.  Ileostomy in place and stoma pink MS: all 4 extremities are symmetrical with no cyanosis, clubbing, or edema. Skin: warm and dry with no masses, lesions, or rashes Psych: A&Ox2, depressed affect   Labs: Results for orders placed or performed during  the hospital encounter of 04/09/15 (from the past 48 hour(s))  APTT     Status: None   Collection Time: 04/09/15 12:06 PM  Result Value Ref Range   aPTT 31 24 - 37 seconds  Basic metabolic panel     Status: Abnormal   Collection Time: 04/09/15 12:06 PM  Result Value Ref Range   Sodium 129 (L) 135 - 145 mmol/L   Potassium 4.3 3.5 - 5.1 mmol/L   Chloride 100 (L) 101 - 111 mmol/L   CO2 17 (L) 22 - 32 mmol/L   Glucose, Bld 97 65 - 99 mg/dL   BUN 11 6 - 20 mg/dL   Creatinine, Ser 0.85 0.44 - 1.00 mg/dL   Calcium 9.9 8.9 - 10.3 mg/dL   GFR calc non Af Amer >60 >60 mL/min   GFR calc Af Amer >60 >60 mL/min    Comment: (NOTE) The eGFR has been calculated using the CKD EPI equation. This calculation has not been validated in all clinical situations. eGFR's persistently <60 mL/min signify possible Chronic Kidney Disease.    Anion gap 12 5 - 15  CBC     Status: Abnormal   Collection Time: 04/09/15 12:06 PM  Result Value Ref Range   WBC 7.3 4.0 - 10.5 K/uL   RBC 4.80 3.87 - 5.11 MIL/uL   Hemoglobin 12.7 12.0 - 15.0 g/dL   HCT 39.7 36.0 - 46.0 %   MCV 82.7 78.0 - 100.0 fL   MCH 26.5 26.0 - 34.0 pg   MCHC 32.0 30.0 - 36.0 g/dL   RDW 18.4 (H) 11.5 - 15.5 %   Platelets 200 150 - 400 K/uL  Protime-INR     Status: Abnormal   Collection Time: 04/09/15 12:06 PM  Result Value Ref Range   Prothrombin Time 15.4 (H) 11.6 - 15.2 seconds   INR 1.20 0.00 - 1.49    Imaging: No  results found.  Assessment/Plan 1. Anorexia, malnutrition, request for g-tube placement -consent was obtained from her daughter as the patient has some confusion -both the patient and the daughter were agreeable to move forward -her labs have all been reviewed and are otherwise on to proceed -the patient did not drink barium last night.  Her facility was called to confirm that she did not drink this.  I d/w Dr. Earleen Newport who would like to try and move forward and will evaluate her anatomy in the IR suite. -Risks and Benefits discussed with the patient including, but not limited to the need for a barium enema during the procedure, bleeding, infection, peritonitis, or damage to adjacent structures. All of the patient's questions were answered, patient is agreeable to proceed. Consent signed and in chart.   Thank you for this interesting consult.  I greatly enjoyed meeting [name] and look forward to participating in their care.  A copy of this report was sent to the requesting provider on this date.  Electronically Signed: Henreitta Cea 04/09/2015, 1:23 PM   I spent a total of  40 Minutes   in face to face in clinical consultation, greater than 50% of which was counseling/coordinating care for anorexia, malnutrition, needs g-tube

## 2015-04-09 NOTE — Progress Notes (Signed)
States pain medication did help with all over pain

## 2015-04-10 ENCOUNTER — Inpatient Hospital Stay (HOSPITAL_COMMUNITY)
Admission: EM | Admit: 2015-04-10 | Discharge: 2015-05-19 | DRG: 640 | Disposition: E | Payer: Commercial Managed Care - HMO | Attending: Internal Medicine | Admitting: Internal Medicine

## 2015-04-10 ENCOUNTER — Encounter: Payer: Self-pay | Admitting: Physician Assistant

## 2015-04-10 ENCOUNTER — Encounter (HOSPITAL_COMMUNITY): Payer: Self-pay | Admitting: Radiology

## 2015-04-10 ENCOUNTER — Encounter (HOSPITAL_COMMUNITY): Payer: Self-pay | Admitting: *Deleted

## 2015-04-10 ENCOUNTER — Emergency Department (HOSPITAL_COMMUNITY): Payer: Commercial Managed Care - HMO

## 2015-04-10 ENCOUNTER — Ambulatory Visit (INDEPENDENT_AMBULATORY_CARE_PROVIDER_SITE_OTHER): Payer: Commercial Managed Care - HMO | Admitting: Physician Assistant

## 2015-04-10 VITALS — BP 118/56 | HR 88 | Ht 68.5 in

## 2015-04-10 DIAGNOSIS — R64 Cachexia: Secondary | ICD-10-CM | POA: Diagnosis present

## 2015-04-10 DIAGNOSIS — K529 Noninfective gastroenteritis and colitis, unspecified: Secondary | ICD-10-CM | POA: Diagnosis present

## 2015-04-10 DIAGNOSIS — R109 Unspecified abdominal pain: Secondary | ICD-10-CM | POA: Insufficient documentation

## 2015-04-10 DIAGNOSIS — Z515 Encounter for palliative care: Secondary | ICD-10-CM | POA: Diagnosis present

## 2015-04-10 DIAGNOSIS — E119 Type 2 diabetes mellitus without complications: Secondary | ICD-10-CM | POA: Diagnosis present

## 2015-04-10 DIAGNOSIS — Z86718 Personal history of other venous thrombosis and embolism: Secondary | ICD-10-CM | POA: Diagnosis not present

## 2015-04-10 DIAGNOSIS — Z86010 Personal history of colonic polyps: Secondary | ICD-10-CM

## 2015-04-10 DIAGNOSIS — E43 Unspecified severe protein-calorie malnutrition: Secondary | ICD-10-CM | POA: Diagnosis present

## 2015-04-10 DIAGNOSIS — R0681 Apnea, not elsewhere classified: Secondary | ICD-10-CM | POA: Diagnosis not present

## 2015-04-10 DIAGNOSIS — Z66 Do not resuscitate: Secondary | ICD-10-CM | POA: Diagnosis not present

## 2015-04-10 DIAGNOSIS — I2510 Atherosclerotic heart disease of native coronary artery without angina pectoris: Secondary | ICD-10-CM | POA: Diagnosis present

## 2015-04-10 DIAGNOSIS — E860 Dehydration: Secondary | ICD-10-CM | POA: Diagnosis present

## 2015-04-10 DIAGNOSIS — E871 Hypo-osmolality and hyponatremia: Secondary | ICD-10-CM | POA: Diagnosis not present

## 2015-04-10 DIAGNOSIS — G9340 Encephalopathy, unspecified: Secondary | ICD-10-CM | POA: Diagnosis present

## 2015-04-10 DIAGNOSIS — E782 Mixed hyperlipidemia: Secondary | ICD-10-CM | POA: Diagnosis present

## 2015-04-10 DIAGNOSIS — L89323 Pressure ulcer of left buttock, stage 3: Secondary | ICD-10-CM | POA: Diagnosis present

## 2015-04-10 DIAGNOSIS — Z933 Colostomy status: Secondary | ICD-10-CM

## 2015-04-10 DIAGNOSIS — K604 Rectal fistula: Secondary | ICD-10-CM | POA: Diagnosis present

## 2015-04-10 DIAGNOSIS — R627 Adult failure to thrive: Principal | ICD-10-CM | POA: Diagnosis present

## 2015-04-10 DIAGNOSIS — N390 Urinary tract infection, site not specified: Secondary | ICD-10-CM | POA: Diagnosis not present

## 2015-04-10 DIAGNOSIS — R52 Pain, unspecified: Secondary | ICD-10-CM | POA: Diagnosis not present

## 2015-04-10 DIAGNOSIS — K6389 Other specified diseases of intestine: Secondary | ICD-10-CM | POA: Diagnosis not present

## 2015-04-10 DIAGNOSIS — Z823 Family history of stroke: Secondary | ICD-10-CM | POA: Diagnosis not present

## 2015-04-10 DIAGNOSIS — Z800 Family history of malignant neoplasm of digestive organs: Secondary | ICD-10-CM

## 2015-04-10 DIAGNOSIS — R1310 Dysphagia, unspecified: Secondary | ICD-10-CM | POA: Diagnosis present

## 2015-04-10 DIAGNOSIS — I959 Hypotension, unspecified: Secondary | ICD-10-CM | POA: Diagnosis present

## 2015-04-10 DIAGNOSIS — K219 Gastro-esophageal reflux disease without esophagitis: Secondary | ICD-10-CM | POA: Diagnosis present

## 2015-04-10 DIAGNOSIS — R1013 Epigastric pain: Secondary | ICD-10-CM | POA: Diagnosis not present

## 2015-04-10 DIAGNOSIS — Z951 Presence of aortocoronary bypass graft: Secondary | ICD-10-CM

## 2015-04-10 DIAGNOSIS — Z789 Other specified health status: Secondary | ICD-10-CM | POA: Diagnosis not present

## 2015-04-10 DIAGNOSIS — Z95828 Presence of other vascular implants and grafts: Secondary | ICD-10-CM | POA: Diagnosis not present

## 2015-04-10 DIAGNOSIS — L8993 Pressure ulcer of unspecified site, stage 3: Secondary | ICD-10-CM | POA: Diagnosis present

## 2015-04-10 DIAGNOSIS — Z91041 Radiographic dye allergy status: Secondary | ICD-10-CM

## 2015-04-10 DIAGNOSIS — M1990 Unspecified osteoarthritis, unspecified site: Secondary | ICD-10-CM | POA: Diagnosis present

## 2015-04-10 DIAGNOSIS — Z881 Allergy status to other antibiotic agents status: Secondary | ICD-10-CM

## 2015-04-10 DIAGNOSIS — B952 Enterococcus as the cause of diseases classified elsewhere: Secondary | ICD-10-CM | POA: Diagnosis present

## 2015-04-10 DIAGNOSIS — Z79891 Long term (current) use of opiate analgesic: Secondary | ICD-10-CM

## 2015-04-10 DIAGNOSIS — I10 Essential (primary) hypertension: Secondary | ICD-10-CM | POA: Diagnosis present

## 2015-04-10 DIAGNOSIS — I252 Old myocardial infarction: Secondary | ICD-10-CM

## 2015-04-10 DIAGNOSIS — Z1621 Resistance to vancomycin: Secondary | ICD-10-CM | POA: Diagnosis present

## 2015-04-10 DIAGNOSIS — Z931 Gastrostomy status: Secondary | ICD-10-CM | POA: Diagnosis not present

## 2015-04-10 DIAGNOSIS — Z79899 Other long term (current) drug therapy: Secondary | ICD-10-CM | POA: Diagnosis not present

## 2015-04-10 DIAGNOSIS — R1084 Generalized abdominal pain: Secondary | ICD-10-CM | POA: Diagnosis not present

## 2015-04-10 DIAGNOSIS — E870 Hyperosmolality and hypernatremia: Secondary | ICD-10-CM | POA: Diagnosis present

## 2015-04-10 DIAGNOSIS — K5010 Crohn's disease of large intestine without complications: Secondary | ICD-10-CM | POA: Diagnosis present

## 2015-04-10 DIAGNOSIS — F329 Major depressive disorder, single episode, unspecified: Secondary | ICD-10-CM | POA: Diagnosis present

## 2015-04-10 DIAGNOSIS — I679 Cerebrovascular disease, unspecified: Secondary | ICD-10-CM | POA: Diagnosis present

## 2015-04-10 DIAGNOSIS — Z87891 Personal history of nicotine dependence: Secondary | ICD-10-CM | POA: Diagnosis not present

## 2015-04-10 DIAGNOSIS — L89153 Pressure ulcer of sacral region, stage 3: Secondary | ICD-10-CM | POA: Diagnosis present

## 2015-04-10 DIAGNOSIS — R532 Functional quadriplegia: Secondary | ICD-10-CM | POA: Diagnosis present

## 2015-04-10 LAB — COMPREHENSIVE METABOLIC PANEL
ALBUMIN: 2.7 g/dL — AB (ref 3.5–5.0)
ALK PHOS: 105 U/L (ref 38–126)
ALT: 16 U/L (ref 14–54)
ANION GAP: 10 (ref 5–15)
AST: 39 U/L (ref 15–41)
BILIRUBIN TOTAL: 0.3 mg/dL (ref 0.3–1.2)
BUN: 16 mg/dL (ref 6–20)
CALCIUM: 9.6 mg/dL (ref 8.9–10.3)
CO2: 17 mmol/L — ABNORMAL LOW (ref 22–32)
Chloride: 99 mmol/L — ABNORMAL LOW (ref 101–111)
Creatinine, Ser: 0.79 mg/dL (ref 0.44–1.00)
Glucose, Bld: 113 mg/dL — ABNORMAL HIGH (ref 65–99)
POTASSIUM: 4.4 mmol/L (ref 3.5–5.1)
Sodium: 126 mmol/L — ABNORMAL LOW (ref 135–145)
TOTAL PROTEIN: 7.4 g/dL (ref 6.5–8.1)

## 2015-04-10 LAB — CBC WITH DIFFERENTIAL/PLATELET
BASOS PCT: 0 %
Basophils Absolute: 0 10*3/uL (ref 0.0–0.1)
Eosinophils Absolute: 0 10*3/uL (ref 0.0–0.7)
Eosinophils Relative: 0 %
HEMATOCRIT: 39.4 % (ref 36.0–46.0)
HEMOGLOBIN: 12.4 g/dL (ref 12.0–15.0)
LYMPHS ABS: 0.9 10*3/uL (ref 0.7–4.0)
Lymphocytes Relative: 7 %
MCH: 26.3 pg (ref 26.0–34.0)
MCHC: 31.5 g/dL (ref 30.0–36.0)
MCV: 83.5 fL (ref 78.0–100.0)
MONO ABS: 0.5 10*3/uL (ref 0.1–1.0)
MONOS PCT: 4 %
NEUTROS ABS: 10.8 10*3/uL — AB (ref 1.7–7.7)
Neutrophils Relative %: 89 %
Platelets: 210 10*3/uL (ref 150–400)
RBC: 4.72 MIL/uL (ref 3.87–5.11)
RDW: 18.3 % — AB (ref 11.5–15.5)
WBC: 12.2 10*3/uL — ABNORMAL HIGH (ref 4.0–10.5)

## 2015-04-10 LAB — URINALYSIS, ROUTINE W REFLEX MICROSCOPIC
GLUCOSE, UA: NEGATIVE mg/dL
KETONES UR: NEGATIVE mg/dL
Nitrite: NEGATIVE
PROTEIN: 30 mg/dL — AB
SPECIFIC GRAVITY, URINE: 1.025 (ref 1.005–1.030)
pH: 6 (ref 5.0–8.0)

## 2015-04-10 LAB — URINE MICROSCOPIC-ADD ON

## 2015-04-10 LAB — TSH: TSH: 0.411 u[IU]/mL (ref 0.350–4.500)

## 2015-04-10 LAB — LIPASE, BLOOD: LIPASE: 29 U/L (ref 11–51)

## 2015-04-10 MED ORDER — ADULT MULTIVITAMIN W/MINERALS CH: 1.0000 | ORAL_TABLET | Freq: Every day | ORAL | Status: DC

## 2015-04-10 MED ORDER — LAMOTRIGINE 25 MG PO TABS
25.0000 mg | ORAL_TABLET | Freq: Every day | ORAL | Status: DC
  Administered 2015-04-10 – 2015-04-13 (×4): 25 mg via ORAL
  Filled 2015-04-10 (×6): qty 1

## 2015-04-10 MED ORDER — PANTOPRAZOLE SODIUM 40 MG PO TBEC
40.0000 mg | DELAYED_RELEASE_TABLET | Freq: Every day | ORAL | Status: DC
  Administered 2015-04-11 – 2015-04-16 (×5): 40 mg via ORAL
  Filled 2015-04-10 (×10): qty 1

## 2015-04-10 MED ORDER — CEFTRIAXONE SODIUM 1 G IJ SOLR
1.0000 g | INTRAMUSCULAR | Status: DC
  Administered 2015-04-11: 1 g via INTRAVENOUS
  Filled 2015-04-10: qty 10

## 2015-04-10 MED ORDER — ACETAMINOPHEN 650 MG RE SUPP
650.0000 mg | Freq: Four times a day (QID) | RECTAL | Status: DC | PRN
Start: 2015-04-10 — End: 2015-04-23

## 2015-04-10 MED ORDER — ONDANSETRON HCL 4 MG/2ML IJ SOLN
4.0000 mg | Freq: Four times a day (QID) | INTRAMUSCULAR | Status: DC | PRN
  Administered 2015-04-13: 4 mg via INTRAVENOUS
  Filled 2015-04-10: qty 2

## 2015-04-10 MED ORDER — ONDANSETRON HCL 4 MG PO TABS: 4.0000 mg | ORAL_TABLET | Freq: Four times a day (QID) | ORAL | Status: DC | PRN

## 2015-04-10 MED ORDER — MESALAMINE 1.2 G PO TBEC
4.8000 g | DELAYED_RELEASE_TABLET | Freq: Every day | ORAL | Status: DC
  Administered 2015-04-10 – 2015-04-20 (×9): 4.8 g via ORAL
  Filled 2015-04-10 (×16): qty 4

## 2015-04-10 MED ORDER — METOPROLOL TARTRATE 25 MG PO TABS
25.0000 mg | ORAL_TABLET | Freq: Two times a day (BID) | ORAL | Status: DC
  Administered 2015-04-10 – 2015-04-16 (×8): 25 mg via ORAL
  Filled 2015-04-10 (×14): qty 1

## 2015-04-10 MED ORDER — MIRTAZAPINE 15 MG PO TABS
15.0000 mg | ORAL_TABLET | Freq: Every day | ORAL | Status: DC
  Administered 2015-04-10 – 2015-04-13 (×3): 15 mg via ORAL
  Filled 2015-04-10 (×5): qty 1

## 2015-04-10 MED ORDER — SODIUM CHLORIDE 0.9 % IV SOLN
INTRAVENOUS | Status: DC
  Administered 2015-04-10 – 2015-04-19 (×8): via INTRAVENOUS

## 2015-04-10 MED ORDER — MORPHINE SULFATE (PF) 2 MG/ML IV SOLN
2.0000 mg | INTRAVENOUS | Status: DC | PRN
  Administered 2015-04-10 – 2015-04-12 (×9): 2 mg via INTRAVENOUS
  Filled 2015-04-10 (×9): qty 1

## 2015-04-10 MED ORDER — DEXTROSE 5 % IV SOLN: 1.0000 g | INTRAVENOUS | Status: DC

## 2015-04-10 MED ORDER — CEFTRIAXONE SODIUM 1 G IJ SOLR
1.0000 g | INTRAMUSCULAR | Status: DC
  Administered 2015-04-10: 1 g via INTRAVENOUS
  Filled 2015-04-10: qty 10

## 2015-04-10 MED ORDER — SERTRALINE HCL 100 MG PO TABS: 100.0000 mg | ORAL_TABLET | Freq: Every day | ORAL | Status: DC

## 2015-04-10 MED ORDER — OXYCODONE HCL 5 MG PO TABS
5.0000 mg | ORAL_TABLET | ORAL | Status: DC | PRN
  Administered 2015-04-10 – 2015-04-14 (×6): 5 mg via ORAL
  Filled 2015-04-10 (×6): qty 1

## 2015-04-10 MED ORDER — ENSURE ENLIVE PO LIQD
237.0000 mL | Freq: Two times a day (BID) | ORAL | Status: DC
Start: 2015-04-10 — End: 2015-04-17
  Administered 2015-04-11: 237 mL via ORAL
  Filled 2015-04-10: qty 237

## 2015-04-10 MED ORDER — ALPRAZOLAM 0.5 MG PO TABS
0.5000 mg | ORAL_TABLET | Freq: Two times a day (BID) | ORAL | Status: DC | PRN
  Administered 2015-04-11 – 2015-04-13 (×2): 0.5 mg via ORAL
  Filled 2015-04-10 (×2): qty 1

## 2015-04-10 MED ORDER — ACETAMINOPHEN 325 MG PO TABS: 650.0000 mg | ORAL_TABLET | Freq: Four times a day (QID) | ORAL | Status: DC | PRN

## 2015-04-10 MED ORDER — IOHEXOL 300 MG/ML  SOLN: 50.0000 mL | Freq: Once | INTRAMUSCULAR | Status: DC | PRN

## 2015-04-10 MED ORDER — HYDROMORPHONE HCL 1 MG/ML IJ SOLN
0.5000 mg | Freq: Once | INTRAMUSCULAR | Status: AC
  Administered 2015-04-10: 0.5 mg via INTRAVENOUS
  Filled 2015-04-10: qty 1

## 2015-04-10 MED ORDER — ATORVASTATIN CALCIUM 80 MG PO TABS
80.0000 mg | ORAL_TABLET | Freq: Every day | ORAL | Status: DC
  Administered 2015-04-10 – 2015-04-13 (×3): 80 mg via ORAL
  Filled 2015-04-10 (×6): qty 1

## 2015-04-10 MED ORDER — ADULT MULTIVITAMIN W/MINERALS CH
1.0000 | ORAL_TABLET | Freq: Every day | ORAL | Status: DC
  Administered 2015-04-10 – 2015-04-13 (×4): 1 via ORAL
  Filled 2015-04-10 (×5): qty 1

## 2015-04-10 MED ORDER — ENOXAPARIN SODIUM 40 MG/0.4ML ~~LOC~~ SOLN
40.0000 mg | SUBCUTANEOUS | Status: DC
  Administered 2015-04-10: 40 mg via SUBCUTANEOUS
  Filled 2015-04-10 (×3): qty 0.4

## 2015-04-10 MED ORDER — SERTRALINE HCL 100 MG PO TABS
100.0000 mg | ORAL_TABLET | Freq: Every day | ORAL | Status: DC
Start: 2015-04-10 — End: 2015-04-14
  Administered 2015-04-10 – 2015-04-13 (×4): 100 mg via ORAL
  Filled 2015-04-10 (×5): qty 1

## 2015-04-10 MED ORDER — SODIUM CHLORIDE 0.9 % IV SOLN
INTRAVENOUS | Status: DC
  Administered 2015-04-10: 12:00:00 via INTRAVENOUS

## 2015-04-10 NOTE — Progress Notes (Signed)
Patient ID: [name], female   DOB: [birth year], 75 y.o.   MRN: [id number]   Subjective:    Patient ID: [name], female    DOB: [birth year], 75 y.o.   MRN: [id number]  HPI [name] is a 75 year old African-American female now established with Dr. Havery Moros with long history of chronic ulcerative colitis with development of fistula rising disease within the past year or so and now felt to have Crohn's colitis. She has multiple other medical issues including coronary artery disease status post MI and CABG bilateral carotid disease status post carotid endarterectomy, history of bilateral DVT status post IVC filter placement. 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 She was initiated on Remicade in July 2016. Due to generalized debilitation she was discharged to a nursing facility, but has been unable to return home since then. She had readmission in August and underwent flexible sigmoidoscopy at that time finding of mild colitis and at that time was continued on a steroid taper. She also had a UTI with Pseudomonas. She was seen in the office in December 2016 underwent subsequent MRI on 02/14/2015 which showed right lateral transsphincteric perianal fistula coursing both superiorly and inferiorly in exiting into the gluteal crease, no associated abscess identified. As far as I can tell her last Remicade infusion was done 03/09/2015. Just prior to that she had infliximab antibody level on with tighter high at 1713 and drug level of less than 0.4-undetectable. Dr. Havery Moros plan to switch her to another biologic. She had another hospitalization in February 2017 with complaints of abdominal pain. She was also noted have generalized failure to thrive and had lost about 60 pounds since summer of 2016. She underwent a noncontrasted CT scan  of the abdomen and pelvis with no acute findings and a heavily calcified aortic and branch vessels or quadrant ileostomy stable and IVC filter placed. Patient was to have office visit here today her general follow-up and to discuss possibly changing to another biologic. Patient was brought to the exam room in a wheelchair moaning and complaining of generalized all over body pain. She was brought from the nursing facility the a transport Lucianne Lei and left at our office for her daughter met her. Review of the chart shows that she has had ongoing decline in her status and has not been wanting to eat or drink and therefore a PEG tube was placed yesterday through IR as an outpatient. Those notes state that she was complaining of pain postprocedure and was given a dose of Dilaudid. On questioning the patient today she says she heart all over and that her abdomen doesn't hurt any worse than anywhere else. She continues to moan and is crying quietly stating" help me." She denies chest pain or shortness of breath. Vitals were stable in the office today but patient is clearly acutely ill.  Review of Systems Pertinent positive and negative review of systems were noted in the above HPI section.  All other review of systems was otherwise negative.  Facility-Administered Encounter Medications as of 04/17/2015  Medication  . inFLIXimab (REMICADE) 5 mg/kg = 300 mg in sodium chloride 0.9 % 250 mL infusion  . [DISCONTINUED] ceFAZolin (ANCEF) 2-3 GM-% IVPB SOLR  . [DISCONTINUED] fentaNYL (SUBLIMAZE) 100 MCG/2ML injection  . [DISCONTINUED] lidocaine (XYLOCAINE) 1 % (with pres) injection  . [  DISCONTINUED] midazolam (VERSED) 2 MG/2ML injection  . [DISCONTINUED] morphine 2 MG/ML injection  . [DISCONTINUED] neomycin-bacitracin-polymyxin (NEOSPORIN) ointment 1 application   Outpatient Encounter Prescriptions as of 03/30/2015  Medication Sig  . acetaminophen (TYLENOL) 500 MG tablet Take 1 tablet (500 mg total) by mouth every 4  (four) hours as needed for mild pain or moderate pain. (Patient taking differently: Take 1,000 mg by mouth every 4 (four) hours as needed for mild pain or moderate pain. )  . ALPRAZolam (XANAX) 0.5 MG tablet Take 1 tablet (0.5 mg total) by mouth 2 (two) times daily as needed for anxiety.  . CRESTOR 40 MG tablet TAKE 1 TABLET AT BEDTIME  . HYDROcodone-acetaminophen (NORCO) 5-325 MG tablet Take 1-2 tablets by mouth every 4 (four) hours as needed for moderate pain.  Marland Kitchen lamoTRIgine (LAMICTAL) 25 MG tablet Take 25 mg by mouth daily.  Marland Kitchen LIALDA 1.2 G EC tablet Take 4.8 g by mouth daily.  . megestrol (MEGACE) 40 MG/ML suspension Take 10 mLs (400 mg total) by mouth daily.  . metoprolol tartrate (LOPRESSOR) 25 MG tablet Take 1 tablet by mouth 2 (two) times daily.  . mirtazapine (REMERON) 15 MG tablet Take 1 tablet (15 mg total) by mouth at bedtime.  Marland Kitchen neomycin-bacitracin-polymyxin (NEOSPORIN) ointment Apply 1 application topically daily. Apply to PEG tube site daily for 7 days  . ondansetron (ZOFRAN) 4 MG tablet Take 1 tablet (4 mg total) by mouth every 6 (six) hours as needed for nausea.  Marland Kitchen oxyCODONE (OXY IR/ROXICODONE) 5 MG immediate release tablet Take one tablet by mouth every six hours for pain  . pantoprazole (PROTONIX) 40 MG tablet Take 1 tablet (40 mg total) by mouth daily at 6 (six) AM.  . sertraline (ZOLOFT) 100 MG tablet Take 1 tablet (100 mg total) by mouth daily.   Allergies  Allergen Reactions  . Clindamycin/Lincomycin Diarrhea    Leads to colitis flare  . Ivp Dye [Iodinated Diagnostic Agents] Other (See Comments)    "almost passed out"   Patient Active Problem List   Diagnosis Date Noted  . Hypomagnesemia 03/27/2015  . Perirectal abscess   . Protein-calorie malnutrition, severe ([place]) 10/12/2014  . Depression 10/12/2014  . Pancolitis ([place])   . UTI (lower urinary tract infection) 10/10/2014  . Pressure ulcer 10/05/2014  . Inflammatory bowel disease   . Malnutrition of moderate  degree ([place]) 09/30/2014  . Rectal bleeding 09/29/2014  . GI bleed 09/29/2014  . Elevated lactic acid level   . Rectal fistula   . Fasciitis   . Cold   . DVT (deep venous thrombosis) ([place])   . Acute ulcerative colitis with rectal bleeding ([place])   . Abscess, perirectal s/p I&D 08/23/2014 08/27/2014  . Sepsis ([place]) 08/26/2014  . Infection due to yeast 08/26/2014  . Acute blood loss anemia 08/26/2014  . Leukocytosis 08/26/2014  . Hypokalemia 08/26/2014  . Hyponatremia 08/26/2014  . General weakness 08/26/2014  . Thrombocytopenia ([place]) 08/26/2014  . Essential hypertension 07/11/2008  . Inflammatory bowel disease (IBD) with colitis 07/11/2008   Social History   Social History  . Marital Status: Married    Spouse Name: N/A  . Number of Children: N/A  . Years of Education: N/A   Occupational History  . retired    Social History Main Topics  . Smoking status: Former Smoker -- 0.30 packs/day for 40 years    Types: Cigarettes    Quit date: 02/17/2013  . Smokeless tobacco: Never Used  . Alcohol Use: No  . Drug  Use: No  . Sexual Activity: Not on file   Other Topics Concern  . Not on file   Social History Narrative    Ms. Ayars's family history includes Colitis in her brother; Stomach cancer in her mother; Stroke in her father. There is no history of Colon cancer.      Objective:    Filed Vitals:   04/01/2015 0909  BP: 118/56  Pulse: 88    Physical Exam  well-developed acutely ill-appearing elderly African-American female, in a wheelchair quietly moaning in pain and crying. Accompanied by her daughter. Patient was brought to the office by transport service from her nursing facility. Blood pressure 118/56, pulse 88, height 5 foot 8 not weighed today. HEENT; nontraumatic normocephalic EOMI PERRLA sclera anicteric, Cardiovascular; tachycardia regular rhythm with S1-S2, Pulmonary clear bilaterally, Abdomen; soft nondistended bowel sounds are present but quiet she has a new PEG in  place and an ileostomy in the right lower quadrant she is tender generally in her abdomen but no guarding or rebound, Rectal; not done, Extremities; no clubbing cyanosis or edema skin warm and dry, Neuropsych, detailed exam not done, patient alert and appropriate       Assessment & Plan:   #1 75 yo AA female with complicated IBD-likely Crohns colitis with development of perirectal abscess July 2016 ultimately requiring I&D and diverting ileostomy with additional finding of a rectal fistula. Patient was initiated on Remicade at at that time. She has had an overall generalized decline in her health over the past 6 months and has remained in a nursing facility. Last Remicade 03/09/2015. Infliximab antibody prior to that dose was positive and drug level less than 0.4/undetectable. Last sigmoidoscopy with mild to moderate colitis 10/03/2015. #2 status post PEG placement per IR yesterday-initiated per nursing home for anorexia and malnutrition #3 generalized pain today, patient acutely uncomfortable, moaning and crying and stating that her whole body hurts. It is not clear that this is of GI etiology or related to pay placement yesterday though it may be. Concern for infection/sepsis.  Plan; patient will be transported via EMS to the emergency room for further evaluation and admission. Decisions regarding management of IBD on hold until acute issues sorted out. This patient is very debilitated , and has had a significant decline in her overall status over the past 8 months ,and remains in a nursing facility and at this time and not sure that continuing a biologic is appropriate. Will discuss with Dr. Havery Moros.  [name] Coxe S Birdie Beveridge PA-C 03/30/2015   Cc: Seward Carol, MD

## 2015-04-10 NOTE — ED Notes (Signed)
IV placed.  Unable to pull blood back but flushes fine.

## 2015-04-10 NOTE — H&P (Signed)
[facility] History and Physical  RITHIKA WYSS [id number] DOB: Jul 14, 1940 DOA: 03/30/2015  Referring physician:  PCP: Kandice Hams, MD   Chief Complaint: Failure to thrive  HPI: AKI THOMLINSON is a 75 y.o. 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 She has had progressive failure to thrive over the past 6 months associate with significant weight loss and minimal by mouth intake. She underwent PEG tube placement on 04/09/2015 and plan to initiate tube feeds and maximize nutritional status. She was transferred to the emergency department from her SNF for ongoing generalized weakness, failure to thrive, minimal by mouth intake, state functional decline. History was obtained from family members present at bedside as patient is lethargic and unable to provide history or participate in her own plan of care. Lab work revealed the presence of urinary tract infection having a white count of 12,200 along with a sodium level of 126. She was recently hospitalized from  03/26/2015 through 03/30/2015 treated for dehydration.                                                                                              Review of Systems:  Unable to obtain reliable review of systems given lethargy  Past Medical History  Diagnosis Date  . CAD (coronary artery disease)     unspecified  . Cerebrovascular disease 2010    bil carotid artery disease. left CEA 02/2004. 02/2014 carotid ultrasound: < 40% bil ICA disease.   Marland Kitchen Hyperlipidemia, mixed   .  HTN (hypertension)   . Ulcerative colitis 2002  . Colon polyps 2006, 2014  . Thyroid disease   . Myocardial infarct ([place]) 1998  . Diabetes mellitus without complication (HCC)     Borderline  . GERD (gastroesophageal reflux disease)   . Arthritis   . 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   . Depression 10/12/2014  . DVT (deep venous thrombosis) ([place]) 08/29/14    left LE, s/p 08/30/14 IVC filter.    Past Surgical History  Procedure Laterality Date  . Cholecystectomy  7.06.2008    with cholangiogram  . 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 Abdominal hysterectomy    . Colonoscopy  multiple  . Sigmoidoscopy  multiple  . Flexible sigmoidoscopy N/A 08/08/2014    Procedure: FLEXIBLE SIGMOIDOSCOPY;  Surgeon: Gatha Mayer, MD;  Location: [facility];  Service: Endoscopy;  Laterality: N/A;  . Incision and drainage perirectal abscess N/A 08/22/2014    Procedure: IRRIGATION AND DEBRIDEMENT PERIRECTAL ABSCESS;  Surgeon: Jackolyn Confer, MD;  Location: [facility];  Service: General;  Laterality: N/A;  . Joint replacement  left knee  . Flexible sigmoidoscopy N/A 09/06/2014    Procedure: FLEXIBLE SIGMOIDOSCOPY;  Surgeon: Lafayette Dragon, MD;  Location: [facility];  Service: Endoscopy;  Laterality: N/A;  . Laparoscopic diverted colostomy N/A 09/13/2014    Procedure: LAPAROSCOPIC DIVERTED ILEOSTOMY;  Surgeon: Stark Klein, MD;  Location: [facility];  Service: General;  Laterality: N/A;  . Rectal exam under anesthesia N/A 09/13/2014    Procedure: RECTAL EXAM UNDER ANESTHESIA  AND DEBRIDEMENT OF PERIANAL WOUND;  Surgeon: Stark Klein, MD;  Location: [facility];  Service: General;  Laterality: N/A;  . Flexible sigmoidoscopy N/A 10/04/2014    Procedure: Otho Darner SIGMOIDOSCOPY;  Surgeon: Inda Castle, MD;  Location: [place];  Service: Endoscopy;  Laterality: N/A;  . Flexible sigmoidoscopy N/A 10/11/2014    Procedure: FLEXIBLE SIGMOIDOSCOPY;  Surgeon: Irene Shipper, MD;  Location: [place];  Service:  Endoscopy;  Laterality: N/A;   Social History:  reports that she quit smoking about 2 years ago. Her smoking use included Cigarettes. She has a 12 pack-year smoking history. She has never used smokeless tobacco. She reports that she does not drink alcohol or use illicit drugs.  Allergies  Allergen Reactions  . Clindamycin/Lincomycin Diarrhea    Leads to colitis flare  . Ivp Dye [Iodinated Diagnostic Agents] Other (See Comments)    "almost passed out"    Family History  Problem Relation Age of Onset  . Colitis Brother     1 bro with Crohn's, another bro with unspecified colitis.   . Stomach cancer Mother   . Stroke Father     died  . Colon cancer Neg Hx      Prior to Admission medications   Medication Sig Start Date End Date Taking? Authorizing Provider  acetaminophen (TYLENOL) 500 MG tablet Take 1 tablet (500 mg total) by mouth every 4 (four) hours as needed for mild pain or moderate pain. 10/17/14  Yes Eugenie Filler, MD  ALPRAZolam Duanne Moron) 0.5 MG tablet Take 1 tablet (0.5 mg total) by mouth 2 (two) times daily as needed for anxiety. Patient taking differently: Take 0.5 mg by mouth daily as needed for anxiety.  03/30/15  Yes Domenic Polite, MD  atorvastatin (LIPITOR) 80 MG tablet Take 80 mg by mouth at bedtime.   Yes Historical Provider, MD  collagenase (SANTYL) ointment Apply 1 application topically daily. To sacrum wound   Yes Historical Provider, MD  ENSURE PLUS (ENSURE PLUS) LIQD Take 237 mLs by mouth 2 (two) times daily.   Yes Historical Provider, MD  HYDROcodone-acetaminophen (NORCO) 5-325 MG tablet Take 1-2 tablets by mouth every 4 (four) hours as needed for moderate pain. 04/09/15  Yes Saverio Danker, PA-C  lamoTRIgine (LAMICTAL) 25 MG tablet Take 25 mg by mouth daily.   Yes Historical Provider, MD  LIALDA 1.2 G EC tablet Take 4.8 g by mouth daily. 08/01/14  Yes Historical Provider, MD  megestrol (MEGACE) 40 MG/ML suspension Take 10 mLs (400 mg total) by mouth daily. 03/30/15   Yes Domenic Polite, MD  metoprolol tartrate (LOPRESSOR) 25 MG tablet Take 1 tablet by mouth 2 (two) times daily. 08/16/14  Yes Historical Provider, MD  mirtazapine (REMERON) 15 MG tablet Take 1 tablet (15 mg total) by mouth at bedtime. 10/17/14  Yes Eugenie Filler, MD  Multiple Vitamin (MULTIVITAMIN WITH MINERALS) TABS tablet Take 1 tablet by mouth daily.   Yes Historical Provider, MD  ondansetron (ZOFRAN) 4 MG tablet Take 1 tablet (4 mg total) by mouth every 6 (six) hours as needed for nausea. Patient taking differently: Take 4 mg by mouth every 8 (eight) hours as needed for nausea.  10/17/14  Yes Irine Seal  V, MD  oxyCODONE (OXY IR/ROXICODONE) 5 MG immediate release tablet Take one tablet by mouth every six hours for pain 03/30/15  Yes Domenic Polite, MD  pantoprazole (PROTONIX) 40 MG tablet Take 1 tablet (40 mg total) by mouth daily at 6 (six) AM. 10/17/14  Yes Eugenie Filler, MD  sertraline (ZOLOFT) 100 MG tablet Take 1 tablet (100 mg total) by mouth daily. 10/17/14  Yes Eugenie Filler, MD   Physical Exam: Filed Vitals:   04/08/2015 1014 03/26/2015 1238 04/13/2015 1503  BP: 129/81 126/78 95/70  Pulse: 104 101 98  Temp: 97.6 F (36.4 C)    TempSrc: Oral    Resp: 18 18 18   SpO2: 94% 99% 94%    Wt Readings from Last 3 Encounters:  04/09/15 76.658 kg (169 lb)  03/28/15 76.839 kg (169 lb 6.4 oz)  03/14/15 61.236 kg (135 lb)    General:  Chronically ill-appearing, appears dehydrated, arousable however poor historian, lethargic Eyes: PERRL, normal lids, irises & conjunctiva ENT: grossly normal hearing, lips & tongue, dry oral mucosa Neck: no LAD, masses or thyromegaly Cardiovascular: RRR, 2/6 systolic ejection murmur. No LE edema. Telemetry: SR, no arrhythmias  Respiratory: CTA bilaterally, no w/r/r. Normal respiratory effort. Abdomen: Colostomy in place, status post PEG tube placement. She has mild to moderate generalized tenderness to palpation Skin: There is a stage II sacral  decubitus ulcer present Musculoskeletal: grossly normal tone BUE/BLE Psychiatric: Patient is lethargic, arousable, minimally interactive Neurologic: Difficult to perform adequate neurologic examination given patient lethargy           Labs on Admission:  Basic Metabolic Panel:  Recent Labs Lab 04/09/15 1206 04/05/2015 1208  NA 129* 126*  K 4.3 4.4  CL 100* 99*  CO2 17* 17*  GLUCOSE 97 113*  BUN 11 16  CREATININE 0.85 0.79  CALCIUM 9.9 9.6   Liver Function Tests:  Recent Labs Lab 04/11/2015 1208  AST 39  ALT 16  ALKPHOS 105  BILITOT 0.3  PROT 7.4  ALBUMIN 2.7*    Recent Labs Lab 04/11/2015 1208  LIPASE 29   No results for input(s): AMMONIA in the last 168 hours. CBC:  Recent Labs Lab 04/09/15 1206 04/09/2015 1208  WBC 7.3 12.2*  NEUTROABS  --  10.8*  HGB 12.7 12.4  HCT 39.7 39.4  MCV 82.7 83.5  PLT 200 210   Cardiac Enzymes: No results for input(s): CKTOTAL, CKMB, CKMBINDEX, TROPONINI in the last 168 hours.  BNP (last 3 results) No results for input(s): BNP in the last 8760 hours.  ProBNP (last 3 results) No results for input(s): PROBNP in the last 8760 hours.  CBG: No results for input(s): GLUCAP in the last 168 hours.  Radiological Exams on Admission: Ct Abdomen Pelvis Wo Contrast  03/24/2015  CLINICAL DATA:  Abdominal pain with generalized weakness. PEG tube placement 04/09/2015 EXAM: CT ABDOMEN AND PELVIS WITHOUT CONTRAST TECHNIQUE: Multidetector CT imaging of the abdomen and pelvis was performed following the standard protocol without IV contrast. COMPARISON:  03/26/2015 FINDINGS: Lower chest and abdominal wall: No abdominal wall hematoma. 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 Extensive atherosclerosis. Hepatobiliary: Negative liver. No evidence of hepatic injury. Cholecystectomy with normal common bile duct diameter Pancreas: Unremarkable. Spleen: Unremarkable.  Adrenals/Urinary Tract: Negative adrenals. No hydronephrosis or stone. 22 mm left renal cyst. Hilar calcifications appear arterial. Extensive bladder calcification which appears mobile based on previous imaging and intraluminal/calculi. Reproductive:Hysterectomy. Stomach/Bowel: Percutaneous gastrostomy tube at the  level of the gastric body or antrum. The tube is endoluminal. There is small pneumoperitoneum, expected post procedure. No leakage of oral contrast. No gastric outlet obstruction or wall thickening. Loop ileostomy in the right lower quadrant. There is perirectal fistula at the 9 o'clock position, known from the electronic medical record. Vascular/Lymphatic: No acute vascular abnormality. IVC filter. No mass or adenopathy. Peritoneal: No ascites or pneumoperitoneum. Musculoskeletal: No acute abnormalities. IMPRESSION: 1. No acute or unexpected finding after percutaneous gastrostomy tube placement. 2. Multiple chronic findings are stable and described above. Electronically Signed   By: Monte Fantasia M.D.   On: 04/02/2015 14:24   Ir Gastrostomy Tube Mod Sed  04/09/2015  INDICATION: 75 year old female with a history of dysphagia. She has been referred for percutaneous gastrostomy placement. EXAM: PERC PLACEMENT GASTROSTOMY MEDICATIONS: 2.0 g Ancef. CLINICAL DATA:  75 year old female with a history of dysphagia ANESTHESIA/SEDATION: Versed 0.5 mg IV; Fentanyl 37.5 mcg IV Moderate Sedation Time:  23 The patient was continuously monitored during the procedure by the interventional radiology nurse under my direct supervision. CONTRAST:  70mL OMNIPAQUE IOHEXOL 300 MG/ML SOLN - administered into the gastric lumen. COMPLICATIONS: None PROCEDURE: Informed written consent was obtained from the patient's family after a thorough discussion of the procedural risks, benefits and alternatives. All questions were addressed. Maximal Sterile Barrier Technique was utilized including caps, mask, sterile gowns, sterile  gloves, sterile drape, hand hygiene and skin antiseptic. A timeout was performed prior to the initiation of the procedure. The procedure, risks, benefits, and alternatives were explained to the patient. Questions regarding the procedure were encouraged and answered. The patient understands and consents to the procedure. The epigastrium was prepped with Betadine in a sterile fashion, and a sterile drape was applied covering the operative field. A sterile gown and sterile gloves were used for the procedure. A 5-French orogastric tube is placed under fluoroscopic guidance. Scout imaging of the abdomen confirms barium within the transverse colon. The stomach was distended with gas. Under fluoroscopic guidance, an 18 gauge needle was utilized to puncture the anterior wall of the body of the stomach. An Amplatz wire was advanced through the needle passing a T fastener into the lumen of the stomach. The T fastener was secured for gastropexy. A 9-French sheath was inserted. A snare was advanced through the 9-French sheath. A Britta Mccreedy was advanced through the orogastric tube. It was snared then pulled out the oral cavity, pulling the snare, as well. The leading edge of the gastrostomy was attached to the snare. It was then pulled down the esophagus and out the percutaneous site. It was secured in place. Contrast was injected. No complication. IMPRESSION: Status post percutaneous gastrostomy tube placement. Signed, Dulcy Fanny. Earleen Newport, DO [facility] Radiology Electronically Signed   By: Corrie Mckusick D.O.   On: 04/09/2015 15:27    EKG: Independently reviewed.   Assessment/Plan Principal Problem:   FTT (failure to thrive) in adult Active Problems:   Essential hypertension   Hyponatremia   Inflammatory bowel disease   UTI (lower urinary tract infection)   Dehydration   1. Failure to thrive. Mrs. Messersmith is a unfortunate 75 year old with multiple comorbidities including  Crohn's disease having multiple complications including development of perirectal abscess status post diverging ileostomy. She has experienced a significant decline over the past 6 months associated with 50 pound weight loss. She underwent PEG tube placement by interventional radiology on 04/09/2015 given minimal by mouth intake and malnutrition. On exam she appears dehydrated and lab  work revealing sodium of 126 would be highly suggestive of profound dehydration. Labs also showing the presence of urinary tract infection. I think her failure to thrive is multifactorial. Will provide IV fluids, empiric IV antibiotic therapy, initiate tube feeds.  2. Complicated Crohn's disease. She was diagnosed with perirectal abscess in July 2016 that required surgical intervention and diverging ileostomy. 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 MRI of pelvis performed on 02/14/2015 show a right lateral transsphincteric perianal fistula coursing superiorly and inferiorly into the gluteal crease. No abscess identified at the time. During this time she has been treated with biologic agents initially with Remicade, or recently GI had proposed switching her to either Stelara or Entyvio in the outpatient setting. Dehydration continues to be an issue. She underwent PEG tube placement on 04/09/2015. Plan to initiate tube feeds 3. Dehydration. Mrs. Levy presenting with ongoing functional decline, having minimal by mouth intake, ongoing weight loss and failure to thrive. She was recently hospitalized for dehydration. She had a PEG tube placed yesterday. Plan to initiate tube feeds.  4. Hyponatremia. Likely secondary to dehydration as initial lab work showing sodium of 126. She previously presented with sodium of 121 on recent hospitalization. Will provide IV fluid resuscitation with normal saline.  5. Urinary tract infection. Urine analysis showing the  presence of many bacteria with moderate leukocytes. Previous urine cultures growing Klebsiella. Will initiate empiric IV antibiotic therapy with ceftriaxone 1 g IV every 24 hours. 6. History of hypertension. Will hold antihypertensive agents as she presents hypotensive likely secondary to volume depletion, having last blood pressure 95/70. 7. History of stage III decubitus ulcer. Poor functional status at baseline, will consult wound care. 8. History of DVT. Status post IVC filter placement on 08/30/2014 9. History of coronary artery disease. Status post coronary artery bypass grafting, she does not appear to have acute cardiopulmonary issues. Denies chest pain. Continue statin, will hold beta blocker due to presence of hypotension. 10. Goals of care. Mrs. Codling has had a significant decline over the past 6 months, having Crohn's disease with multiple complications. Her functional status is poor and she currently resides at a skilled nursing facility. She recently underwent placement of PEG tube as she has had issues with dehydration, requiring recent hospitalization. Will consult palliative care to facilitate discussions of goals of care.  Code Status: Full code DVT Prophylaxis: Lovenox Family Communication: I spoke with family members present at bedside Disposition Plan: Will admit inpatient service, anticipate she'll require greater than 2 nights hospitalization  Time spent: 70 min  Kelvin Cellar [facility] Pager [phone]

## 2015-04-10 NOTE — Progress Notes (Signed)
Agree with assessment and plan. IBD with perirectal abscess with fistulous tract, was on Remicade. Her last titers were low with high antibody level, rendering the drug useless and putting her at risk for side effects from Remicade. She will no longer be on Remicade. I had initially recommended she go on Humira however given her change in health status her acute issues need to be assessed first and will decide how to treat her IBD long term once these acute issues have been addressed.

## 2015-04-10 NOTE — ED Provider Notes (Addendum)
CSN: [id number]     Arrival date & time 03/22/2015  0957 History   First MD Initiated Contact with Patient 04/06/2015 1021     Chief Complaint  Patient presents with  . Abdominal Pain     (Consider location/radiation/quality/duration/timing/severity/associated sxs/prior Treatment) Patient is a 75 y.o. female presenting with abdominal pain. The history is provided by the patient and a relative.  Abdominal Pain Pain location:  Generalized Pain quality: aching   Pain radiates to:  Does not radiate Pain severity:  Moderate Onset quality:  Sudden Timing:  Intermittent Progression:  Waxing and waning Chronicity:  New Relieved by:  Nothing Worsened by:  Nothing tried Ineffective treatments:  None tried Associated symptoms: nausea   Associated symptoms: no chills, no diarrhea, no fever, no melena and no vomiting     Past Medical History  Diagnosis Date  . CAD (coronary artery disease)     unspecified  . Cerebrovascular disease 2010    bil carotid artery disease. left CEA 02/2004. 02/2014 carotid ultrasound: < 40% bil ICA disease.   Marland Kitchen Hyperlipidemia, mixed   . HTN (hypertension)   . Ulcerative colitis 2002  . Colon polyps 2006, 2014  . Thyroid disease   . Myocardial infarct ([place]) 1998  . Diabetes mellitus without complication (HCC)     Borderline  . GERD (gastroesophageal reflux disease)   . Arthritis   . 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   . Depression 10/12/2014  . DVT (deep venous thrombosis) ([place]) 08/29/14    left LE, s/p 08/30/14 IVC filter.    Past Surgical History  Procedure Laterality Date  . Cholecystectomy  7.06.2008    with cholangiogram  . 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 Abdominal hysterectomy    . Colonoscopy  multiple  . Sigmoidoscopy  multiple  . Flexible sigmoidoscopy N/A 08/08/2014    Procedure: FLEXIBLE SIGMOIDOSCOPY;  Surgeon: Gatha Mayer, MD;  Location: [place];  Service: Endoscopy;  Laterality: N/A;  . Incision and drainage  perirectal abscess N/A 08/22/2014    Procedure: IRRIGATION AND DEBRIDEMENT PERIRECTAL ABSCESS;  Surgeon: Jackolyn Confer, MD;  Location: [facility];  Service: General;  Laterality: N/A;  . Joint replacement  left knee  . Flexible sigmoidoscopy N/A 09/06/2014    Procedure: FLEXIBLE SIGMOIDOSCOPY;  Surgeon: Lafayette Dragon, MD;  Location: [facility];  Service: Endoscopy;  Laterality: N/A;  . Laparoscopic diverted colostomy N/A 09/13/2014    Procedure: LAPAROSCOPIC DIVERTED ILEOSTOMY;  Surgeon: Stark Klein, MD;  Location: [facility];  Service: General;  Laterality: N/A;  . Rectal exam under anesthesia N/A 09/13/2014    Procedure: RECTAL EXAM UNDER ANESTHESIA  AND DEBRIDEMENT OF PERIANAL WOUND;  Surgeon: Stark Klein, MD;  Location: [facility];  Service: General;  Laterality: N/A;  . Flexible sigmoidoscopy N/A 10/04/2014    Procedure: Otho Darner SIGMOIDOSCOPY;  Surgeon: Inda Castle, MD;  Location: [place];  Service: Endoscopy;  Laterality: N/A;  . Flexible sigmoidoscopy N/A 10/11/2014    Procedure: FLEXIBLE SIGMOIDOSCOPY;  Surgeon: Irene Shipper, MD;  Location: [place];  Service: Endoscopy;  Laterality: N/A;   Family History  Problem Relation Age of Onset  . Colitis Brother     1 bro with Crohn's, another bro with unspecified colitis.   . Stomach cancer Mother   . Stroke Father     died  . Colon cancer Neg Hx    Social History  Substance Use Topics  . Smoking status: Former Smoker -- 0.30 packs/day for 40  years    Types: Cigarettes    Quit date: 02/17/2013  . Smokeless tobacco: Never Used  . Alcohol Use: No   OB History    No data available     Review of Systems  Constitutional: Negative for fever and chills.  Gastrointestinal: Positive for nausea and abdominal pain. Negative for vomiting, diarrhea and melena.  All other systems reviewed and are negative.     Allergies  Clindamycin/lincomycin and Ivp dye  Home Medications   Prior to Admission medications   Medication Sig Start  Date End Date Taking? Authorizing Provider  acetaminophen (TYLENOL) 500 MG tablet Take 1 tablet (500 mg total) by mouth every 4 (four) hours as needed for mild pain or moderate pain. Patient taking differently: Take 1,000 mg by mouth every 4 (four) hours as needed for mild pain or moderate pain.  10/17/14   Eugenie Filler, MD  ALPRAZolam Duanne Moron) 0.5 MG tablet Take 1 tablet (0.5 mg total) by mouth 2 (two) times daily as needed for anxiety. 03/30/15   Domenic Polite, MD  CRESTOR 40 MG tablet TAKE 1 TABLET AT BEDTIME 05/17/14   Lelon Perla, MD  HYDROcodone-acetaminophen [facility]) 5-325 MG tablet Take 1-2 tablets by mouth every 4 (four) hours as needed for moderate pain. 04/09/15   Saverio Danker, PA-C  lamoTRIgine (LAMICTAL) 25 MG tablet Take 25 mg by mouth daily.    Historical Provider, MD  LIALDA 1.2 G EC tablet Take 4.8 g by mouth daily. 08/01/14   Historical Provider, MD  megestrol (MEGACE) 40 MG/ML suspension Take 10 mLs (400 mg total) by mouth daily. 03/30/15   Domenic Polite, MD  metoprolol tartrate (LOPRESSOR) 25 MG tablet Take 1 tablet by mouth 2 (two) times daily. 08/16/14   Historical Provider, MD  mirtazapine (REMERON) 15 MG tablet Take 1 tablet (15 mg total) by mouth at bedtime. 10/17/14   Eugenie Filler, MD  neomycin-bacitracin-polymyxin (NEOSPORIN) ointment Apply 1 application topically daily. Apply to PEG tube site daily for 7 days 04/09/15   Saverio Danker, PA-C  ondansetron (ZOFRAN) 4 MG tablet Take 1 tablet (4 mg total) by mouth every 6 (six) hours as needed for nausea. 10/17/14   Eugenie Filler, MD  oxyCODONE (OXY IR/ROXICODONE) 5 MG immediate release tablet Take one tablet by mouth every six hours for pain 03/30/15   Domenic Polite, MD  pantoprazole (PROTONIX) 40 MG tablet Take 1 tablet (40 mg total) by mouth daily at 6 (six) AM. 10/17/14   Eugenie Filler, MD  sertraline (ZOLOFT) 100 MG tablet Take 1 tablet (100 mg total) by mouth daily. 10/17/14   Eugenie Filler, MD   BP 129/81  mmHg  Pulse 104  Temp(Src) 97.6 F (36.4 C) (Oral)  Resp 18  SpO2 94% Physical Exam  Constitutional: She is oriented to person, place, and time. She appears well-developed and well-nourished.  Non-toxic appearance. No distress.  HENT:  Head: Normocephalic and atraumatic.  Eyes: Conjunctivae, EOM and lids are normal. Pupils are equal, round, and reactive to light.  Neck: Normal range of motion. Neck supple. No tracheal deviation present. No thyroid mass present.  Cardiovascular: Regular rhythm and normal heart sounds.  Tachycardia present.  Exam reveals no gallop.   No murmur heard. Pulmonary/Chest: Effort normal and breath sounds normal. No stridor. No respiratory distress. She has no decreased breath sounds. She has no wheezes. She has no rhonchi. She has no rales.  Abdominal: Soft. Normal appearance and bowel sounds are normal. She exhibits no  distension and no ascites. There is generalized tenderness. There is no rigidity, no rebound and no CVA tenderness.    Musculoskeletal: Normal range of motion. She exhibits no edema or tenderness.  Neurological: She is alert and oriented to person, place, and time. She has normal strength. No cranial nerve deficit or sensory deficit. GCS eye subscore is 4. GCS verbal subscore is 5. GCS motor subscore is 6.  Skin: Skin is warm and dry. No abrasion and no rash noted.  Psychiatric: She has a normal mood and affect. Her speech is normal and behavior is normal.  Nursing note and vitals reviewed.   ED Course  Procedures (including critical care time) Labs Review Labs Reviewed  URINE CULTURE  CBC WITH DIFFERENTIAL/PLATELET  COMPREHENSIVE METABOLIC PANEL  LIPASE, BLOOD  URINALYSIS, ROUTINE W REFLEX MICROSCOPIC (NOT AT [facility])    Imaging Review Ir Gastrostomy Tube Mod Sed  04/09/2015  INDICATION: 75 year old female with a history of dysphagia. She has been referred for percutaneous gastrostomy placement. EXAM: PERC PLACEMENT GASTROSTOMY  MEDICATIONS: 2.0 g Ancef. CLINICAL DATA:  75 year old female with a history of dysphagia ANESTHESIA/SEDATION: Versed 0.5 mg IV; Fentanyl 37.5 mcg IV Moderate Sedation Time:  23 The patient was continuously monitored during the procedure by the interventional radiology nurse under my direct supervision. CONTRAST:  18mL OMNIPAQUE IOHEXOL 300 MG/ML SOLN - administered into the gastric lumen. COMPLICATIONS: None PROCEDURE: Informed written consent was obtained from the patient's family after a thorough discussion of the procedural risks, benefits and alternatives. All questions were addressed. Maximal Sterile Barrier Technique was utilized including caps, mask, sterile gowns, sterile gloves, sterile drape, hand hygiene and skin antiseptic. A timeout was performed prior to the initiation of the procedure. The procedure, risks, benefits, and alternatives were explained to the patient. Questions regarding the procedure were encouraged and answered. The patient understands and consents to the procedure. The epigastrium was prepped with Betadine in a sterile fashion, and a sterile drape was applied covering the operative field. A sterile gown and sterile gloves were used for the procedure. A 5-French orogastric tube is placed under fluoroscopic guidance. Scout imaging of the abdomen confirms barium within the transverse colon. The stomach was distended with gas. Under fluoroscopic guidance, an 18 gauge needle was utilized to puncture the anterior wall of the body of the stomach. An Amplatz wire was advanced through the needle passing a T fastener into the lumen of the stomach. The T fastener was secured for gastropexy. A 9-French sheath was inserted. A snare was advanced through the 9-French sheath. A Britta Mccreedy was advanced through the orogastric tube. It was snared then pulled out the oral cavity, pulling the snare, as well. The leading edge of the gastrostomy was attached to the snare. It was then pulled down the esophagus  and out the percutaneous site. It was secured in place. Contrast was injected. No complication. IMPRESSION: Status post percutaneous gastrostomy tube placement. Signed, Dulcy Fanny. Earleen Newport, DO [facility] Radiology Electronically Signed   By: Corrie Mckusick D.O.   On: 04/09/2015 15:27   I have personally reviewed and evaluated these images and lab results as part of my medical decision-making.   EKG Interpretation None      MDM   Final diagnoses:  None    Patient with evidence of dehydration as well as urinary tract infection. Will be admitted to the hospitalist service   Lacretia Leigh, MD 04/15/15 Walker, MD 04/15/15 2252

## 2015-04-10 NOTE — Patient Instructions (Signed)
Called 911 per Nicoletta Ba PA to transport patient to [facility] ER.  Severe chest, and generalized pain.  Weakness.

## 2015-04-10 NOTE — ED Notes (Signed)
Patient is from [place] and had a peg tube placed on yesterday. Patient was sent out for a doctor's appointment today and complained of generalized weakness and pain at the insertion site of the peg. Patient is oriented to person, but did not know where she was today.

## 2015-04-11 DIAGNOSIS — R627 Adult failure to thrive: Principal | ICD-10-CM

## 2015-04-11 DIAGNOSIS — N390 Urinary tract infection, site not specified: Secondary | ICD-10-CM

## 2015-04-11 DIAGNOSIS — K6389 Other specified diseases of intestine: Secondary | ICD-10-CM

## 2015-04-11 LAB — MRSA PCR SCREENING: MRSA BY PCR: NEGATIVE

## 2015-04-11 LAB — CBC
HCT: 34.6 % — ABNORMAL LOW (ref 36.0–46.0)
HCT: 35 % — ABNORMAL LOW (ref 36.0–46.0)
HEMOGLOBIN: 11.1 g/dL — AB (ref 12.0–15.0)
Hemoglobin: 11.3 g/dL — ABNORMAL LOW (ref 12.0–15.0)
MCH: 26.6 pg (ref 26.0–34.0)
MCH: 26.8 pg (ref 26.0–34.0)
MCHC: 32.1 g/dL (ref 30.0–36.0)
MCHC: 32.3 g/dL (ref 30.0–36.0)
MCV: 83 fL (ref 78.0–100.0)
MCV: 82.9 fL (ref 78.0–100.0)
PLATELETS: 223 10*3/uL (ref 150–400)
Platelets: 215 10*3/uL (ref 150–400)
RBC: 4.17 MIL/uL (ref 3.87–5.11)
RBC: 4.22 MIL/uL (ref 3.87–5.11)
RDW: 18.5 % — ABNORMAL HIGH (ref 11.5–15.5)
RDW: 18.6 % — AB (ref 11.5–15.5)
WBC: 10 10*3/uL (ref 4.0–10.5)
WBC: 10 10*3/uL (ref 4.0–10.5)

## 2015-04-11 LAB — BASIC METABOLIC PANEL
ANION GAP: 8 (ref 5–15)
BUN: 15 mg/dL (ref 6–20)
CALCIUM: 9.2 mg/dL (ref 8.9–10.3)
CO2: 17 mmol/L — AB (ref 22–32)
Chloride: 103 mmol/L (ref 101–111)
Creatinine, Ser: 0.57 mg/dL (ref 0.44–1.00)
GFR calc Af Amer: >60 mL/min (ref >60)
Glucose, Bld: 87 mg/dL (ref 65–99)
Potassium: 4.2 mmol/L (ref 3.5–5.1)
SODIUM: 128 mmol/L — AB (ref 135–145)

## 2015-04-11 LAB — HEMOGLOBIN AND HEMATOCRIT, BLOOD
HEMATOCRIT: 38.3 % (ref 36.0–46.0)
HEMOGLOBIN: 12.3 g/dL (ref 12.0–15.0)

## 2015-04-11 LAB — OCCULT BLOOD X 1 CARD TO LAB, STOOL: FECAL OCCULT BLD: POSITIVE — AB

## 2015-04-11 MED ORDER — VITAL 1.5 CAL PO LIQD
1000.0000 mL | ORAL | Status: DC
  Administered 2015-04-11 – 2015-04-14 (×3): 1000 mL
  Filled 2015-04-11 (×5): qty 1000

## 2015-04-11 MED ORDER — FREE WATER
100.0000 mL | Freq: Four times a day (QID) | Status: DC
  Administered 2015-04-11 – 2015-04-17 (×23): 100 mL

## 2015-04-11 MED ORDER — JEVITY 1.2 CAL PO LIQD: 1000.0000 mL | ORAL | Status: DC

## 2015-04-11 MED ORDER — METRONIDAZOLE IN NACL 5-0.79 MG/ML-% IV SOLN
500.0000 mg | Freq: Three times a day (TID) | INTRAVENOUS | Status: DC
Start: 2015-04-11 — End: 2015-04-17
  Administered 2015-04-11 – 2015-04-17 (×16): 500 mg via INTRAVENOUS
  Filled 2015-04-11 (×18): qty 100

## 2015-04-11 MED ORDER — CIPROFLOXACIN IN D5W 400 MG/200ML IV SOLN
400.0000 mg | Freq: Two times a day (BID) | INTRAVENOUS | Status: DC
  Administered 2015-04-11 – 2015-04-14 (×8): 400 mg via INTRAVENOUS
  Filled 2015-04-11 (×9): qty 200

## 2015-04-11 NOTE — Progress Notes (Signed)
[facility] PROGRESS NOTE  [name] [id number] DOB: June 02, 1940 DOA: 04/03/2015  PCP: Kandice Hams, MD  Brief HPI: 75 year old African-American female with multiple comorbidities including Crohn's colitis complicated by perirectal fistula with the progressively worsening failure to thrive, weight loss, who was hospitalized recently for dehydration and hyponatremia. She presents again with the difficulty with taking anything orally and failure to thrive and generalized weakness. She was found to have hyponatremia. She was hospitalized for further management  Past medical history:  Past Medical History  Diagnosis Date  . CAD (coronary artery disease)     unspecified  . Cerebrovascular disease 2010    bil carotid artery disease. left CEA 02/2004. 02/2014 carotid ultrasound: < 40% bil ICA disease.   Marland Kitchen Hyperlipidemia, mixed   . HTN (hypertension)   . Ulcerative colitis 2002  . Colon polyps 2006, 2014  . Thyroid disease   . Myocardial infarct ([place]) 1998  . Diabetes mellitus without complication (HCC)     Borderline  . GERD (gastroesophageal reflux disease)   . Arthritis   . 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   . Depression 10/12/2014  . DVT (deep venous thrombosis) ([place]) 08/29/14    left LE, s/p 08/30/14 IVC filter.     Consultants: Gastroenterology  Procedures: Underwent PEG tube placement on 2/20 as outpatient  Antibiotics: Ceftriaxone  Subjective: Patient mildly confused. Complains of pain in the abdomen. Unable to provide much information at this time.  Objective: Vital Signs  Filed Vitals:   04/07/2015 1503 04/09/2015 1947 04/05/2015 2008 04/11/15 0556  BP: 95/70 109/74 114/72 102/63  Pulse: 98 98 104 99  Temp:  97.6 F (36.4 C) 97.6 F (36.4 C) 98.3 F (36.8 C)  TempSrc:  Axillary Axillary Oral  Resp: 18 18 18 18   SpO2: 94% 93% 99% 97%    Intake/Output Summary (Last 24 hours) at 04/11/15 1148 Last data filed at 04/11/15 0556  Gross per 24  hour  Intake    911 ml  Output    525 ml  Net    386 ml   There were no vitals filed for this visit.  General appearance: alert, distracted and no distress Head: Normocephalic, without obvious abnormality, atraumatic Resp: Diminished air entry at the bases without any crackles or wheezing. Cardio: regular rate and rhythm, S1, S2 normal, no murmur, click, rub or gallop GI: Abdomen is soft. Mildly tender in the lower quadrants without any rebound, rigidity or guarding. Extremities: extremities normal, atraumatic, no cyanosis or edema Neurologic: Awake, alert, confused, disoriented. No focal deficits  Lab Results:  Basic Metabolic Panel:  Recent Labs Lab 04/09/15 1206 04/15/2015 1208 04/11/15 0538  NA 129* 126* 128*  K 4.3 4.4 4.2  CL 100* 99* 103  CO2 17* 17* 17*  GLUCOSE 97 113* 87  BUN 11 16 15   CREATININE 0.85 0.79 0.57  CALCIUM 9.9 9.6 9.2   Liver Function Tests:  Recent Labs Lab 04/06/2015 1208  AST 39  ALT 16  ALKPHOS 105  BILITOT 0.3  PROT 7.4  ALBUMIN 2.7*    Recent Labs Lab 03/25/2015 1208  LIPASE 29   CBC:  Recent Labs Lab 04/09/15 1206 04/15/2015 1208 04/11/15 0004 04/11/15 0538  WBC 7.3 12.2*  --  10.0  NEUTROABS  --  10.8*  --   --   HGB 12.7 12.4 12.3 11.1*  HCT 39.7 39.4 38.3 34.6*  MCV 82.7 83.5  --  83.0  PLT 200 210  --  215  Recent Results (from the past 240 hour(s))  MRSA PCR Screening     Status: None   Collection Time: 03/30/2015 11:45 PM  Result Value Ref Range Status   MRSA by PCR NEGATIVE NEGATIVE Final    Comment:        The GeneXpert MRSA Assay (FDA approved for NASAL specimens only), is one component of a comprehensive MRSA colonization surveillance program. It is not intended to diagnose MRSA infection nor to guide or monitor treatment for MRSA infections.       Studies/Results: Ct Abdomen Pelvis Wo Contrast  03/28/2015  CLINICAL DATA:  Abdominal pain with generalized weakness. PEG tube placement 04/09/2015  EXAM: CT ABDOMEN AND PELVIS WITHOUT CONTRAST TECHNIQUE: Multidetector CT imaging of the abdomen and pelvis was performed following the standard protocol without IV contrast. COMPARISON:  03/26/2015 FINDINGS: Lower chest and abdominal wall: No abdominal wall hematoma. 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 Extensive atherosclerosis. Hepatobiliary: Negative liver. No evidence of hepatic injury. Cholecystectomy with normal common bile duct diameter Pancreas: Unremarkable. Spleen: Unremarkable. Adrenals/Urinary Tract: Negative adrenals. No hydronephrosis or stone. 22 mm left renal cyst. Hilar calcifications appear arterial. Extensive bladder calcification which appears mobile based on previous imaging and intraluminal/calculi. Reproductive:Hysterectomy. Stomach/Bowel: Percutaneous gastrostomy tube at the level of the gastric body or antrum. The tube is endoluminal. There is small pneumoperitoneum, expected post procedure. No leakage of oral contrast. No gastric outlet obstruction or wall thickening. Loop ileostomy in the right lower quadrant. There is perirectal fistula at the 9 o'clock position, known from the electronic medical record. Vascular/Lymphatic: No acute vascular abnormality. IVC filter. No mass or adenopathy. Peritoneal: No ascites or pneumoperitoneum. Musculoskeletal: No acute abnormalities. IMPRESSION: 1. No acute or unexpected finding after percutaneous gastrostomy tube placement. 2. Multiple chronic findings are stable and described above. Electronically Signed   By: Monte Fantasia M.D.   On: 03/22/2015 14:24   Ir Gastrostomy Tube Mod Sed  04/09/2015  INDICATION: 75 year old female with a history of dysphagia. She has been referred for percutaneous gastrostomy placement. EXAM: PERC PLACEMENT GASTROSTOMY MEDICATIONS: 2.0 g Ancef. CLINICAL DATA:  75 year old female with a history of dysphagia ANESTHESIA/SEDATION: Versed 0.5  mg IV; Fentanyl 37.5 mcg IV Moderate Sedation Time:  23 The patient was continuously monitored during the procedure by the interventional radiology nurse under my direct supervision. CONTRAST:  23mL OMNIPAQUE IOHEXOL 300 MG/ML SOLN - administered into the gastric lumen. COMPLICATIONS: None PROCEDURE: Informed written consent was obtained from the patient's family after a thorough discussion of the procedural risks, benefits and alternatives. All questions were addressed. Maximal Sterile Barrier Technique was utilized including caps, mask, sterile gowns, sterile gloves, sterile drape, hand hygiene and skin antiseptic. A timeout was performed prior to the initiation of the procedure. The procedure, risks, benefits, and alternatives were explained to the patient. Questions regarding the procedure were encouraged and answered. The patient understands and consents to the procedure. The epigastrium was prepped with Betadine in a sterile fashion, and a sterile drape was applied covering the operative field. A sterile gown and sterile gloves were used for the procedure. A 5-French orogastric tube is placed under fluoroscopic guidance. Scout imaging of the abdomen confirms barium within the transverse colon. The stomach was distended with gas. Under fluoroscopic guidance, an 18 gauge needle was utilized to puncture the anterior wall of the body of the stomach. An Amplatz wire was advanced through the needle passing a T fastener into the lumen of the  stomach. The T fastener was secured for gastropexy. A 9-French sheath was inserted. A snare was advanced through the 9-French sheath. A Britta Mccreedy was advanced through the orogastric tube. It was snared then pulled out the oral cavity, pulling the snare, as well. The leading edge of the gastrostomy was attached to the snare. It was then pulled down the esophagus and out the percutaneous site. It was secured in place. Contrast was injected. No complication. IMPRESSION: Status post  percutaneous gastrostomy tube placement. Signed, Dulcy Fanny. Earleen Newport, DO [facility] Radiology Electronically Signed   By: Corrie Mckusick D.O.   On: 04/09/2015 15:27    Medications:  Scheduled: . atorvastatin  80 mg Oral QHS  . cefTRIAXone (ROCEPHIN)  IV  1 g Intravenous Q24H  . enoxaparin (LOVENOX) injection  40 mg Subcutaneous Q24H  . feeding supplement (ENSURE ENLIVE)  237 mL Oral BID BM  . lamoTRIgine  25 mg Oral Daily  . mesalamine  4.8 g Oral Daily  . metoprolol tartrate  25 mg Oral BID  . mirtazapine  15 mg Oral QHS  . multivitamin with minerals  1 tablet Oral Daily  . pantoprazole  40 mg Oral Q0600  . sertraline  100 mg Oral Daily   Continuous: . sodium chloride 100 mL/hr at 04/11/15 0532   [id number] **OR** acetaminophen, ALPRAZolam, morphine injection, ondansetron **OR** ondansetron (ZOFRAN) IV, oxyCODONE  Assessment/Plan:  Principal Problem:   FTT (failure to thrive) in adult Active Problems:   Essential hypertension   Hyponatremia   Inflammatory bowel disease   UTI (lower urinary tract infection)   Dehydration    Failure to thrive Most likely secondary to her Crohn's disease and poor oral intake. PEG tube was placed by radiology on 2/20. This has not been used yet. Nutritionist has been consulted. Continue IV fluids. Treat her UTI.   Complicated Crohn's disease with bleeding from perianal fistula She was diagnosed with perirectal abscess in July 2016 that required surgical intervention and diverging ileostomy. 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 MRI of pelvis performed on 02/14/2015 show a right lateral transsphincteric perianal fistula coursing superiorly and inferiorly into the gluteal crease. No abscess identified at the time. During this time she has been treated with biologic agents initially with Remicade. However, she seems  to have developed antibodies to this medication. GI was considering other agents. However, considering her poor general health she may not be a candidate for same. GI has been consulted for further input. The do recommend palliative medicine input. Monitor CBCs due to her bleeding. Transfuse as needed.  Dehydration and hyponatremia Continue IV fluids. Sodium level has improved. Continue to monitor closely.  Urinary tract infection UA shows presence of many bacteria with moderate leukocytes. Previous urine cultures growing Klebsiella. Continue ceftriaxone for now. Repeat cultures are pending.   History of essential hypertension Home medications were held due to hypotension. Blood pressures are stable. Continue to monitor.   History of stage III decubitus ulcer Poor functional status at baseline, will consult wound care.  History of DVT Status post IVC filter placement on 08/30/2014  History of coronary artery disease status post coronary artery bypass grafting She does not appear to have acute cardiopulmonary issues. Continue statin. Holding beta blocker due to presence of hypotension.  Goals of care Mrs. Caine has had a significant decline over the past 6 months, having Crohn's  disease with multiple complications. Her functional status is poor and she currently resides at a skilled nursing facility. She recently underwent placement of PEG tube as she has had issues with dehydration, requiring recent hospitalization. Palliative medicine has been consulted. This was also recommended by gastroenterology.   DVT Prophylaxis: No Lovenox due to perianal fistula bleeding. No SCDs due to history of DVT. TED stockings.    Code Status: Full code for now  Family Communication: No family at bedside  Disposition Plan: Await palliative medicine and GI input.    LOS: 1 day   [place] [facility] Pager [phone] 04/11/2015, 11:48 AM  If 7PM-7AM, please contact night-coverage at  [email or web address], password [facility]

## 2015-04-11 NOTE — Progress Notes (Signed)
Pt. Has a moderate amount of bloody output from perirectal fistula. bp was stable ivf NS @ 1100ml/hr were infusing and pt . Asymptomatic. NP Baltazar Najjar notified, orders were obtained for H&H and occult blood. Specimens obtained as ordered. NP Baltazar Najjar notified of positive occult blood. Pt in no distress will continue to monitor.

## 2015-04-11 NOTE — Progress Notes (Signed)
Initial Nutrition Assessment  DOCUMENTATION CODES:   Severe malnutrition in context of acute illness/injury  INTERVENTION:  - Continue Regular diet - Will order Vital 1.5 @ 55 mL/hr to provide 1980 kcal, 89 grams of protein, and 1008 mL free water. Initiate TF at 15 mL/hr and increase by 10 mL every 8 hours to reach goal rate. - Will order 100 mL free water via PEG QID to provide additional 400 mL/day. - Monitor magnesium, potassium, and phosphorus daily for at least 3 days, MD to replete as needed, as pt is at risk for refeeding syndrome given prolonged period with inadequate nutrition. - RD will continue to monitor for needs  NUTRITION DIAGNOSIS:   Inadequate oral intake related to chronic illness as evidenced by per patient/family report.  GOAL:   Patient will meet greater than or equal to 90% of their needs  MONITOR:   PO intake, TF tolerance, Weight trends, Labs, Skin, I & O's  REASON FOR ASSESSMENT:   Consult Enteral/tube feeding initiation and management  ASSESSMENT:   75 y.o. 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 She has had progressive failure to thrive over the past 6 months associate with significant weight loss and minimal by mouth intake. She underwent PEG tube placement on 04/09/2015 and plan to initiate tube feeds and maximize nutritional status. She was transferred to the emergency department from her SNF for ongoing generalized weakness, failure to thrive, minimal by mouth intake, state functional  decline.   Pt seen for TF initiation and management consult. BMI indicates overweight status. Pt on Regular diet with no intakes documented. She had PEG placed 04/09/15. Pt resting with eye closed at time of RD visit; she groans and grunts throughout the course of RD visit and family, at bedside, report pt was recently given medication.  Family provide all information. For 1 month PTA pt had very minimal intakes related to poor appetite and discomfort due to IBS. Family unsure of any N/V or diarrhea that were associated with intakes PTA. Family very concerned about nutritional status and have many questions about TF which were discussed with them. Family states that pt has lost 45 lbs in the past 1 month. Notes indicate pt was prescribed Megace PTA but this did not assist.   Chart review indicates recorded weights may not be entirely accurate as pt reportedly had fluctuations from 112-217 lbs since 10/19/14. Current weight of 169 lbs represent a 34 lb weight gain since 03/14/15, a 2 lb weight loss (1.1% body weight) from 03/09/15 which is not significant, and a 29 lb weight loss (15% body weight) from 01/02/15 which is significant for time frame. Unable to do physical assessment at this time per family request. Physical assessment was done by another RD 03/28/15 and found mild/moderate muscle and mild/moderate fat wasting to upper and lower body in addition to severe muscle wasting to hand and calf region.   Pt not meeting needs. Will order TF as outlined above. Pt may be at risk for refeeding given reported prolonged period with inadequate nutrition. Medications reviewed. Labs reviewed; Na: 128 mmol/L.   Diet Order:  Diet regular Room service  appropriate?: Yes; Fluid consistency:: Thin  Skin:   Stage 2 ulcers to sacrum and bilateral buttocks, Unstageable ulcer to sacrum, L abdominal incision from PEG placement 04/09/15  Last BM:  PTA  Height:   Ht Readings from Last 1 Encounters:  04/03/2015 5' 8.5" (1.74  m)    Weight:   Wt Readings from Last 1 Encounters:  04/09/15 169 lb (76.658 kg)    Ideal Body Weight:  64.77 kg (kg)  BMI:  25.38 kg/m2  Estimated Nutritional Needs:   Kcal:  1900-2100 (25-28 kcal/kg)  Protein:  90-100 grams (1.2-1.3 grams/kg)  Fluid:  >/= 2.1 L/day  EDUCATION NEEDS:   No education needs identified at this time     Jarome Matin, RD, LDN Inpatient Clinical Dietitian Pager # [phone] After hours/weekend pager # [phone]

## 2015-04-11 NOTE — Consult Note (Signed)
Referring Provider:  Dr. Nicki Guadalajara Primary Care Physician:  Kandice Hams, MD Primary Gastroenterologist:  Dr. Havery Moros  Reason for Consultation:  Crohn's disease  Patient seen in our office yesterday, 2/21, by Nicoletta Ba, PA-C.  The note below is copied from her visit and will serve as consult.  HPI: [name] is a 75 y.o. female now established with Dr. Havery Moros with long history of chronic ulcerative colitis with development of fistula rising disease within the past year or so and now felt to have Crohn's colitis. She has multiple other medical issues including coronary artery disease status post MI and CABG bilateral carotid disease status post carotid endarterectomy, history of bilateral DVT status post IVC filter placement. 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 She was initiated on Remicade in July 2016. Due to generalized debilitation she was discharged to a nursing facility, but has been unable to return home since then. She had readmission in August and underwent flexible sigmoidoscopy at that time finding of mild colitis and at that time was continued on a steroid taper. She also had a UTI with Pseudomonas. She was seen in the office in December 2016 underwent subsequent MRI on 02/14/2015 which showed right lateral transsphincteric perianal fistula coursing both superiorly and inferiorly in exiting into the gluteal crease, no associated abscess identified. As far as I can tell her last Remicade infusion was done 03/09/2015. Just prior to that she had infliximab antibody level on with tighter high at 1713 and drug level of less than 0.4-undetectable. Dr. Havery Moros plan to switch her to another biologic. She had another hospitalization in February 2017 with complaints of abdominal pain. She was also noted have  generalized failure to thrive and had lost about 60 pounds since summer of 2016. She underwent a noncontrasted CT scan of the abdomen and pelvis with no acute findings and a heavily calcified aortic and branch vessels or quadrant ileostomy stable and IVC filter placed. Patient was to have office visit here today her general follow-up and to discuss possibly changing to another biologic. Patient was brought to the exam room in a wheelchair moaning and complaining of generalized all over body pain. She was brought from the nursing facility the a transport Lucianne Lei and left at our office for her daughter met her. Review of the chart shows that she has had ongoing decline in her status and has not been wanting to eat or drink and therefore a PEG tube was placed yesterday through IR as an outpatient. Those notes state that she was complaining of pain postprocedure and was given a dose of Dilaudid. On questioning the patient today she says she heart all over and that her abdomen doesn't hurt any worse than anywhere else. She continues to moan and is crying quietly stating" help me." She denies chest pain or shortness of breath. Vitals were stable in the office today but patient is clearly acutely ill.  She was sent to [facility] ED via EMS.  Was admitted for FTT and also being treated for UTI.  CT scan of the abdomen and pelvis with contrast show no acute issues or changes from previous other than new PEG in place.  Labs are stable and the only acute issue is hyponatremia.  Complains of diffuse abdominal pain.  Past Medical History  Diagnosis Date  .  CAD (coronary artery disease)     unspecified  . Cerebrovascular disease 2010    bil carotid artery disease. left CEA 02/2004. 02/2014 carotid ultrasound: < 40% bil ICA disease.   Marland Kitchen Hyperlipidemia, mixed   . HTN (hypertension)   . Ulcerative colitis 2002  . Colon polyps 2006, 2014  . Thyroid disease   . Myocardial infarct ([place]) 1998  . Diabetes mellitus without  complication (HCC)     Borderline  . GERD (gastroesophageal reflux disease)   . Arthritis   . 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   . Depression 10/12/2014  . DVT (deep venous thrombosis) ([place]) 08/29/14    left LE, s/p 08/30/14 IVC filter.     Past Surgical History  Procedure Laterality Date  . Cholecystectomy  7.06.2008    with cholangiogram  . 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 Abdominal hysterectomy    . Colonoscopy  multiple  . Sigmoidoscopy  multiple  . Flexible sigmoidoscopy N/A 08/08/2014    Procedure: FLEXIBLE SIGMOIDOSCOPY;  Surgeon: Gatha Mayer, MD;  Location: [place];  Service: Endoscopy;  Laterality: N/A;  . Incision and drainage perirectal abscess N/A 08/22/2014    Procedure: IRRIGATION AND DEBRIDEMENT PERIRECTAL ABSCESS;  Surgeon: Jackolyn Confer, MD;  Location: [facility];  Service: General;  Laterality: N/A;  . Joint replacement  left knee  . Flexible sigmoidoscopy N/A 09/06/2014    Procedure: FLEXIBLE SIGMOIDOSCOPY;  Surgeon: Lafayette Dragon, MD;  Location: [facility];  Service: Endoscopy;  Laterality: N/A;  . Laparoscopic diverted colostomy N/A 09/13/2014    Procedure: LAPAROSCOPIC DIVERTED ILEOSTOMY;  Surgeon: Stark Klein, MD;  Location: [facility];  Service: General;  Laterality: N/A;  . Rectal exam under anesthesia N/A 09/13/2014    Procedure: RECTAL EXAM UNDER ANESTHESIA  AND DEBRIDEMENT OF PERIANAL WOUND;  Surgeon: Stark Klein, MD;  Location: [facility];  Service: General;  Laterality: N/A;  . Flexible sigmoidoscopy N/A 10/04/2014    Procedure: Otho Darner SIGMOIDOSCOPY;  Surgeon: Inda Castle, MD;  Location: [place];  Service: Endoscopy;  Laterality: N/A;  . Flexible sigmoidoscopy N/A 10/11/2014    Procedure: FLEXIBLE SIGMOIDOSCOPY;  Surgeon: Irene Shipper, MD;  Location: [place];  Service: Endoscopy;  Laterality: N/A;    Prior to Admission medications   Medication Sig Start Date End Date Taking? Authorizing Provider  acetaminophen (TYLENOL)  500 MG tablet Take 1 tablet (500 mg total) by mouth every 4 (four) hours as needed for mild pain or moderate pain. 10/17/14  Yes Eugenie Filler, MD  ALPRAZolam Duanne Moron) 0.5 MG tablet Take 1 tablet (0.5 mg total) by mouth 2 (two) times daily as needed for anxiety. Patient taking differently: Take 0.5 mg by mouth daily as needed for anxiety.  03/30/15  Yes Domenic Polite, MD  atorvastatin (LIPITOR) 80 MG tablet Take 80 mg by mouth at bedtime.   Yes Historical Provider, MD  collagenase (SANTYL) ointment Apply 1 application topically daily. To sacrum wound   Yes Historical Provider, MD  ENSURE PLUS (ENSURE PLUS) LIQD Take 237 mLs by mouth 2 (two) times daily.   Yes Historical Provider, MD  HYDROcodone-acetaminophen (NORCO) 5-325 MG tablet Take 1-2 tablets by mouth every 4 (four) hours as needed for moderate pain. 04/09/15  Yes Saverio Danker, PA-C  lamoTRIgine (LAMICTAL) 25 MG tablet Take 25 mg by mouth daily.   Yes Historical Provider, MD  LIALDA 1.2 G EC tablet Take 4.8 g by mouth daily. 08/01/14  Yes Historical Provider, MD  megestrol (  MEGACE) 40 MG/ML suspension Take 10 mLs (400 mg total) by mouth daily. 03/30/15  Yes Domenic Polite, MD  metoprolol tartrate (LOPRESSOR) 25 MG tablet Take 1 tablet by mouth 2 (two) times daily. 08/16/14  Yes Historical Provider, MD  mirtazapine (REMERON) 15 MG tablet Take 1 tablet (15 mg total) by mouth at bedtime. 10/17/14  Yes Eugenie Filler, MD  Multiple Vitamin (MULTIVITAMIN WITH MINERALS) TABS tablet Take 1 tablet by mouth daily.   Yes Historical Provider, MD  ondansetron (ZOFRAN) 4 MG tablet Take 1 tablet (4 mg total) by mouth every 6 (six) hours as needed for nausea. Patient taking differently: Take 4 mg by mouth every 8 (eight) hours as needed for nausea.  10/17/14  Yes Eugenie Filler, MD  oxyCODONE (OXY IR/ROXICODONE) 5 MG immediate release tablet Take one tablet by mouth every six hours for pain 03/30/15  Yes Domenic Polite, MD  pantoprazole (PROTONIX) 40 MG  tablet Take 1 tablet (40 mg total) by mouth daily at 6 (six) AM. 10/17/14  Yes Eugenie Filler, MD  sertraline (ZOLOFT) 100 MG tablet Take 1 tablet (100 mg total) by mouth daily. 10/17/14  Yes Eugenie Filler, MD    Current Facility-Administered Medications  Medication Dose Route Frequency Provider Last Rate Last Dose  . 0.9 %  sodium chloride infusion   Intravenous Continuous Kelvin Cellar, MD 100 mL/hr at 04/11/15 0532    . acetaminophen (TYLENOL) tablet 650 mg  650 mg Oral Q6H PRN Kelvin Cellar, MD       Or  . acetaminophen (TYLENOL) suppository 650 mg  650 mg Rectal Q6H PRN Kelvin Cellar, MD      . ALPRAZolam Duanne Moron) tablet 0.5 mg  0.5 mg Oral BID PRN Kelvin Cellar, MD      . atorvastatin (LIPITOR) tablet 80 mg  80 mg Oral QHS Kelvin Cellar, MD   80 mg at 04/13/2015 2232  . cefTRIAXone (ROCEPHIN) 1 g in dextrose 5 % 50 mL IVPB  1 g Intravenous Q24H Kelvin Cellar, MD      . enoxaparin (LOVENOX) injection 40 mg  40 mg Subcutaneous Q24H Kelvin Cellar, MD   40 mg at 03/29/2015 2231  . feeding supplement (ENSURE ENLIVE) (ENSURE ENLIVE) liquid 237 mL  237 mL Oral BID BM Kelvin Cellar, MD   237 mL at 04/11/15 0944  . lamoTRIgine (LAMICTAL) tablet 25 mg  25 mg Oral Daily Kelvin Cellar, MD   25 mg at 04/11/15 [phone]  . mesalamine (LIALDA) EC tablet 4.8 g  4.8 g Oral Daily Kelvin Cellar, MD   4.8 g at 04/11/2015 2232  . metoprolol tartrate (LOPRESSOR) tablet 25 mg  25 mg Oral BID Kelvin Cellar, MD   25 mg at 04/11/15 0942  . mirtazapine (REMERON) tablet 15 mg  15 mg Oral QHS Kelvin Cellar, MD   15 mg at 03/30/2015 2233  . morphine 2 MG/ML injection 2 mg  2 mg Intravenous Q4H PRN Kelvin Cellar, MD   2 mg at 04/11/15 2458  . multivitamin with minerals tablet 1 tablet  1 tablet Oral Daily Kelvin Cellar, MD   1 tablet at 04/11/15 [phone]  . ondansetron (ZOFRAN) tablet 4 mg  4 mg Oral Q6H PRN Kelvin Cellar, MD       Or  . ondansetron (ZOFRAN) injection 4 mg  4 mg Intravenous Q6H PRN  Kelvin Cellar, MD      . oxyCODONE (Oxy IR/ROXICODONE) immediate release tablet 5 mg  5 mg Oral Q4H PRN Kelvin Cellar,  MD   5 mg at 04/11/15 0551  . pantoprazole (PROTONIX) EC tablet 40 mg  40 mg Oral Q0600 Kelvin Cellar, MD   40 mg at 04/11/15 0532  . sertraline (ZOLOFT) tablet 100 mg  100 mg Oral Daily Kelvin Cellar, MD   100 mg at 04/11/15 3716    Allergies as of 04/13/2015 - Review Complete 04/16/2015  Allergen Reaction Noted  . Clindamycin/lincomycin Diarrhea 08/07/2014  . Ivp dye [iodinated diagnostic agents] Other (See Comments) 01/14/2011    Family History  Problem Relation Age of Onset  . Colitis Brother     1 bro with Crohn's, another bro with unspecified colitis.   . Stomach cancer Mother   . Stroke Father     died  . Colon cancer Neg Hx     Social History   Social History  . Marital Status: Married    Spouse Name: N/A  . Number of Children: N/A  . Years of Education: N/A   Occupational History  . retired    Social History Main Topics  . Smoking status: Former Smoker -- 0.30 packs/day for 40 years    Types: Cigarettes    Quit date: 02/17/2013  . Smokeless tobacco: Never Used  . Alcohol Use: No  . Drug Use: No  . Sexual Activity: Not on file   Other Topics Concern  . Not on file   Social History Narrative    Review of Systems: Ten point ROS is O/W negative except as mentioned in HPI.  Physical Exam: Vital signs in last 24 hours: Temp:  [97.6 F (36.4 C)-98.3 F (36.8 C)] 98.3 F (36.8 C) (02/22 0556) Pulse Rate:  [98-104] 99 (02/22 0556) Resp:  [18] 18 (02/22 0556) BP: (95-126)/(63-78) 102/63 mmHg (02/22 0556) SpO2:  [93 %-99 %] 97 % (02/22 0556)   General:  Alert, chronically ill-appearing Head:  Normocephalic and atraumatic. Eyes:  Sclera clear, no icterus.   Conjunctiva pink. Ears:  Normal auditory acuity. Mouth:  No deformity or lesions.   Lungs:  Clear throughout to auscultation.   No wheezes, crackles, or rhonchi.  Heart:   Regular rate and rhythm; no murmurs, clicks, rubs,  or gallops. Abdomen:  Soft, non-distended.  BS present.  New PEG in place.  Ileostomy noted in RLQ.  Diffusely tender but no guarding or rebound present. Rectal:  Deferred  Msk:  Symmetrical without gross deformities. Pulses:  Normal pulses noted. Extremities:  Without clubbing or edema. Neurologic:  Alert and oriented x 4;  grossly normal neurologically. Skin:  Intact without significant lesions or rashes. Psych:  Alert and cooperative. Normal mood and affect.  Intake/Output from previous day: 02/21 0701 - 02/22 0700 In: 911 [I.V.:911] Out: 525 [Urine:50; Stool:475]  Lab Results:  Recent Labs  04/09/15 1206 04/08/2015 1208 04/11/15 0004 04/11/15 0538  WBC 7.3 12.2*  --  10.0  HGB 12.7 12.4 12.3 11.1*  HCT 39.7 39.4 38.3 34.6*  PLT 200 210  --  215   BMET  Recent Labs  04/09/15 1206 03/27/2015 1208 04/11/15 0538  NA 129* 126* 128*  K 4.3 4.4 4.2  CL 100* 99* 103  CO2 17* 17* 17*  GLUCOSE 97 113* 87  BUN _0 CREATININE 0.85 0.79 0.57  CALCIUM 9.9 9.6 9.2   LFT  Recent Labs  04/05/2015 1208  PROT 7.4  ALBUMIN 2.7*  AST 39  ALT 16  ALKPHOS 105  BILITOT 0.3   PT/INR  Recent Labs  04/09/15 1206  LABPROT  15.4*  INR 1.20   Studies/Results: Ct Abdomen Pelvis Wo Contrast  04/08/2015  CLINICAL DATA:  Abdominal pain with generalized weakness. PEG tube placement 04/09/2015 EXAM: CT ABDOMEN AND PELVIS WITHOUT CONTRAST TECHNIQUE: Multidetector CT imaging of the abdomen and pelvis was performed following the standard protocol without IV contrast. COMPARISON:  03/26/2015 FINDINGS: Lower chest and abdominal wall: No abdominal wall hematoma. 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 Extensive atherosclerosis. Hepatobiliary: Negative liver. No evidence of hepatic injury. Cholecystectomy with normal common bile duct diameter Pancreas:  Unremarkable. Spleen: Unremarkable. Adrenals/Urinary Tract: Negative adrenals. No hydronephrosis or stone. 22 mm left renal cyst. Hilar calcifications appear arterial. Extensive bladder calcification which appears mobile based on previous imaging and intraluminal/calculi. Reproductive:Hysterectomy. Stomach/Bowel: Percutaneous gastrostomy tube at the level of the gastric body or antrum. The tube is endoluminal. There is small pneumoperitoneum, expected post procedure. No leakage of oral contrast. No gastric outlet obstruction or wall thickening. Loop ileostomy in the right lower quadrant. There is perirectal fistula at the 9 o'clock position, known from the electronic medical record. Vascular/Lymphatic: No acute vascular abnormality. IVC filter. No mass or adenopathy. Peritoneal: No ascites or pneumoperitoneum. Musculoskeletal: No acute abnormalities. IMPRESSION: 1. No acute or unexpected finding after percutaneous gastrostomy tube placement. 2. Multiple chronic findings are stable and described above. Electronically Signed   By: Monte Fantasia M.D.   On: 04/11/2015 14:24   Ir Gastrostomy Tube Mod Sed  04/09/2015  INDICATION: 75 year old female with a history of dysphagia. She has been referred for percutaneous gastrostomy placement. EXAM: PERC PLACEMENT GASTROSTOMY MEDICATIONS: 2.0 g Ancef. CLINICAL DATA:  75 year old female with a history of dysphagia ANESTHESIA/SEDATION: Versed 0.5 mg IV; Fentanyl 37.5 mcg IV Moderate Sedation Time:  23 The patient was continuously monitored during the procedure by the interventional radiology nurse under my direct supervision. CONTRAST:  44m OMNIPAQUE IOHEXOL 300 MG/ML SOLN - administered into the gastric lumen. COMPLICATIONS: None PROCEDURE: Informed written consent was obtained from the patient's family after a thorough discussion of the procedural risks, benefits and alternatives. All questions were addressed. Maximal Sterile Barrier Technique was utilized including caps,  mask, sterile gowns, sterile gloves, sterile drape, hand hygiene and skin antiseptic. A timeout was performed prior to the initiation of the procedure. The procedure, risks, benefits, and alternatives were explained to the patient. Questions regarding the procedure were encouraged and answered. The patient understands and consents to the procedure. The epigastrium was prepped with Betadine in a sterile fashion, and a sterile drape was applied covering the operative field. A sterile gown and sterile gloves were used for the procedure. A 5-French orogastric tube is placed under fluoroscopic guidance. Scout imaging of the abdomen confirms barium within the transverse colon. The stomach was distended with gas. Under fluoroscopic guidance, an 18 gauge needle was utilized to puncture the anterior wall of the body of the stomach. An Amplatz wire was advanced through the needle passing a T fastener into the lumen of the stomach. The T fastener was secured for gastropexy. A 9-French sheath was inserted. A snare was advanced through the 9-French sheath. A BBritta Mccreedywas advanced through the orogastric tube. It was snared then pulled out the oral cavity, pulling the snare, as well. The leading edge of the gastrostomy was attached to the snare. It was then pulled down the esophagus and out the percutaneous site. It was secured in place. Contrast was injected. No complication. IMPRESSION: Status post percutaneous gastrostomy tube placement. Signed, JDulcy Fanny  Earleen Newport, DO [facility] Electronically Signed   By: Corrie Mckusick D.O.   On: 04/09/2015 15:27    IMPRESSION:  #1 75 yo AA female with complicated IBD-likely Crohns colitis with development of perirectal abscess July 2016 ultimately requiring I&D and diverting ileostomy with additional finding of a rectal fistula. Patient was initiated on Remicade at at that time. She has had an overall generalized decline in her health  over the past 6 months and has remained in a nursing facility. Last Remicade 03/09/2015. Infliximab antibody prior to that dose was positive and drug level less than 0.4/undetectable. Last sigmoidoscopy with mild to moderate colitis 09/2014. #2 status post PEG placement per IR yesterday-initiated per nursing home for anorexia and malnutrition/FTT #3 UTI:  On Rocephin. #4 Hyponatremia:  Likely related to dehydration/poor PO intake and nutritional status.  PLAN: -Unfortunately we may not have a lot to add at this time.  Her fistula is going to be very difficult to heal.  Remicade has been stopped and unsure that continuing biologics is the correct route for her given her poor overall status.  We will treat the fistula with antibiotics (cipro and flagyl) for now.  She is also being treated for UTI with Rocephin, but I discussed with Dr. [place] Pink who said that UTI should be sensitive to cipro as well, therefore, Rocephin was discontinued.  Nutrition has seen her and made recommendations/orders for PEG feedings.  I was told that palliative care consult is pending, which is absolutely appropriate.  She may perk up somewhat with IV hydration, correction of hyponatremia, initiation of tube feeds, and treatment of UTI, but overall her status is poor.  Siren Porrata D.  04/11/2015, 10:25 AM  Pager number [phone]

## 2015-04-12 DIAGNOSIS — K604 Rectal fistula: Secondary | ICD-10-CM

## 2015-04-12 DIAGNOSIS — Z515 Encounter for palliative care: Secondary | ICD-10-CM

## 2015-04-12 LAB — BASIC METABOLIC PANEL
ANION GAP: 8 (ref 5–15)
BUN: 11 mg/dL (ref 6–20)
CALCIUM: 8.9 mg/dL (ref 8.9–10.3)
CO2: 15 mmol/L — AB (ref 22–32)
Chloride: 108 mmol/L (ref 101–111)
Creatinine, Ser: 0.57 mg/dL (ref 0.44–1.00)
GFR calc Af Amer: >60 mL/min (ref >60)
GFR calc non Af Amer: >60 mL/min (ref >60)
GLUCOSE: 97 mg/dL (ref 65–99)
Potassium: 3.5 mmol/L (ref 3.5–5.1)
Sodium: 131 mmol/L — ABNORMAL LOW (ref 135–145)

## 2015-04-12 LAB — CBC
HEMATOCRIT: 36.7 % (ref 36.0–46.0)
Hemoglobin: 11.7 g/dL — ABNORMAL LOW (ref 12.0–15.0)
MCH: 27.3 pg (ref 26.0–34.0)
MCHC: 31.9 g/dL (ref 30.0–36.0)
MCV: 85.5 fL (ref 78.0–100.0)
Platelets: 210 10*3/uL (ref 150–400)
RBC: 4.29 MIL/uL (ref 3.87–5.11)
RDW: 19.1 % — AB (ref 11.5–15.5)
WBC: 10.1 10*3/uL (ref 4.0–10.5)

## 2015-04-12 MED ORDER — MORPHINE SULFATE (PF) 2 MG/ML IV SOLN
2.0000 mg | INTRAVENOUS | Status: DC | PRN
  Administered 2015-04-12 – 2015-04-14 (×18): 2 mg via INTRAVENOUS
  Filled 2015-04-12 (×19): qty 1

## 2015-04-12 MED ORDER — LIP MEDEX EX OINT
TOPICAL_OINTMENT | CUTANEOUS | Status: AC
  Administered 2015-04-12: 13:00:00
  Filled 2015-04-12: qty 7

## 2015-04-12 NOTE — Progress Notes (Signed)
[facility] Progress Note  Subjective:  Says that she feels a little better today.  Denies abdominal pain.  Tube feeds apparently were started at 55 cc but decreased to 35 cc due to high residual.  Objective:  Vital signs in last 24 hours: Temp:  [96.8 F (36 C)-97.6 F (36.4 C)] 97.6 F (36.4 C) (02/23 0435) Pulse Rate:  [79-107] 79 (02/23 0833) Resp:  [20-22] 22 (02/23 0435) BP: (115-133)/(65-83) 115/65 mmHg (02/23 0833) SpO2:  [96 %-100 %] 96 % (02/23 0435)   General:  Alert, chronically ill-appearing, in NAD Heart:  Regular rate and rhythm; no murmurs Pulm:  CTAB.  No W/R/R. Abdomen:  Soft, non-distended. Bowel sounds present.  Ileostomy noted with yellow/brown liquid stool in bag.  Gastrostomy tube noted as well. Extremities:  Without edema. Neurologic:  Alert and oriented x 4;  grossly normal neurologically. Psych:  Alert and cooperative. Normal mood and affect.  Intake/Output from previous day: 02/22 0701 - 02/23 0700 In: 2300 [I.V.:2300] Out: 500 [Stool:500]  Lab Results:  Recent Labs  04/11/15 0538 04/11/15 1240 04/12/15 0533  WBC 10.0 10.0 10.1  HGB 11.1* 11.3* 11.7*  HCT 34.6* 35.0* 36.7  PLT 215 223 210   BMET  Recent Labs  04/08/2015 1208 04/11/15 0538 04/12/15 0533  NA 126* 128* 131*  K 4.4 4.2 3.5  CL 99* 103 108  CO2 17* 17* 15*  GLUCOSE 113* 87 97  BUN 16 15 11   CREATININE 0.79 0.57 0.57  CALCIUM 9.6 9.2 8.9   LFT  Recent Labs  04/12/2015 1208  PROT 7.4  ALBUMIN 2.7*  AST 39  ALT 16  ALKPHOS 105  BILITOT 0.3   PT/INR  Recent Labs  04/09/15 1206  LABPROT 15.4*  INR 1.20   Ct Abdomen Pelvis Wo Contrast  03/23/2015  CLINICAL DATA:  Abdominal pain with generalized weakness. PEG tube placement 04/09/2015 EXAM: CT ABDOMEN AND PELVIS WITHOUT CONTRAST TECHNIQUE: Multidetector CT imaging of the abdomen and pelvis was performed following the standard protocol without IV contrast. COMPARISON:  03/26/2015 FINDINGS:  Lower chest and abdominal wall: No abdominal wall hematoma. 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 Extensive atherosclerosis. Hepatobiliary: Negative liver. No evidence of hepatic injury. Cholecystectomy with normal common bile duct diameter Pancreas: Unremarkable. Spleen: Unremarkable. Adrenals/Urinary Tract: Negative adrenals. No hydronephrosis or stone. 22 mm left renal cyst. Hilar calcifications appear arterial. Extensive bladder calcification which appears mobile based on previous imaging and intraluminal/calculi. Reproductive:Hysterectomy. Stomach/Bowel: Percutaneous gastrostomy tube at the level of the gastric body or antrum. The tube is endoluminal. There is small pneumoperitoneum, expected post procedure. No leakage of oral contrast. No gastric outlet obstruction or wall thickening. Loop ileostomy in the right lower quadrant. There is perirectal fistula at the 9 o'clock position, known from the electronic medical record. Vascular/Lymphatic: No acute vascular abnormality. IVC filter. No mass or adenopathy. Peritoneal: No ascites or pneumoperitoneum. Musculoskeletal: No acute abnormalities. IMPRESSION: 1. No acute or unexpected finding after percutaneous gastrostomy tube placement. 2. Multiple chronic findings are stable and described above. Electronically Signed   By: Monte Fantasia M.D.   On: 03/26/2015 14:24   Assessment / Plan: #1 75 yo AA female with complicated IBD-likely Crohns colitis with development of perirectal abscess July 2016 ultimately requiring I&D and diverting ileostomy with additional finding of a rectal fistula. Patient was initiated on Remicade at at that time. She has had an overall generalized decline in her health over  the past 6 months and has remained in a nursing facility. Last Remicade 03/09/2015. Infliximab antibody prior to that dose was positive and drug level less than 0.4/undetectable. Last  sigmoidoscopy with mild to moderate colitis 09/2014. #2 status post PEG placement per IR this week-initiated per nursing home for anorexia and malnutrition/FTT #3 UTI: On Cipro. #4 Hyponatremia: Likely related to dehydration/poor PO intake and nutritional status.  Improving.  -Unfortunately we may not have a lot to add at this time. Her fistula is going to be very difficult to heal. Remicade has been stopped and unsure that continuing biologics is the correct route for her given her poor overall status. We will treat the fistula with antibiotics (cipro and flagyl) for now. She is also being treated for UTI (cipro will cover this as well).  Nutrition has seen her and made recommendations/orders for tube feedings (continuous). I was told that palliative care consult is pending, which is absolutely appropriate. She may perk up somewhat with IV hydration, correction of hyponatremia, initiation of tube feeds, and treatment of UTI, but overall her status is poor.   LOS: 2 days   Mackenzy Eisenberg D.  04/12/2015, 8:59 AM  Pager number [phone]

## 2015-04-12 NOTE — Consult Note (Signed)
WOC wound consult note  Reason for Consult: Stage 3 coccyx pressure injury Noted to have ileostomy as well Wound type:  Stage 3 Pressure Injury: coccyx; red and moist 2 Stage 3 Pressure Injuries; left buttock; both pink, dry Pressure Ulcer POA: Yes Measurement: Gluteal cleft: 3cm x 1cm x 0.5 cm  Left buttock 0.5cm x 0.5cm x 0.1cm and 1cm x 0.5cm x 0.1cm  Wound bed: pink and moist Drainage (amount, consistency, odor) minimal, no odor Periwound: intact, tender  Dressing procedure/placement/frequency: Silicone border foam to pressure injury to left buttocks.  Change every 3 days and PRN soilage.  Cleanse coccyx wound with NS and pat gently dry  Apply calcium alginate to wound bed.  Cover with foam dressing.   WOC ostomy consult note Stoma type/location: RLQ, end ileostomy Stomal assessment/size: visualized through pouch, pink, moist, aprox 1 3/8"  Peristomal assessment: pouch intact Treatment options for stomal/peristomal skin: NA Output liquid, brown effluent Ostomy pouching: 1pc   Will order supplies to the bedside for staff to use as needed.  Will not follow at this time.  Please re-consult if needed.  Domenic Moras RN BSN [place] Pager [phone]

## 2015-04-12 NOTE — Consult Note (Signed)
Consultation Note Date: 04/12/2015   Patient Name: [name]  DOB: [birth year]  MRN: [id number]  Age / Sex: 75 y.o., female  PCP: Seward Carol, MD Referring Physician: Bonnielee Haff, MD  Reason for Consultation: Establishing goals of care  [name] is a 75 year old woman from SNF [facility]) with Crohn's disease, perianal fissure since June 2016, failure to thrive. Recent PEG placement 04/09/2015 and currently beginning tube feedings.   Clinical Assessment/Narrative: I met today in [name]'s room. She was not alert and did not participate in conversation but was present. Her 2 daughters and son were present. We discussed the complexity of Crohn's disease, fistual, and her very frail and fragile state. She has been in SNF mostly since last summer. We further discussed current therapies of antibiotics and tube feeding and that we are somewhat limited on any further options that can improve her condition at this time. Also told them that we are concerned that this may not get much better for her and that these are problems we cannot reverse. Discussed role of protein/nutrition in healing. They are very tearful as they are recognizing the severity of her condition. They express that they want to continue moving forward with tube feedings and antibiotics and see how she does. We did discuss that they may need to prepare themselves for possibly difficult decisions. This conversation obviously weighs very heavily on them. They do say that keeping her comfortable and managing pain is priority as well. I did not address code status today as they are trying to absorb the information I have already provided. I contacted Dr. Maryland Pink at their request as they would like to hear the opinions/recommendations of her providers and they had not spoken with him yet - he will call them with update. I think this is very appropriate for  them to gather all the information they need to make good decisions. I told them that I would follow up with them tomorrow and allow them time to digest this information.   Contacts/Participants in Discussion: Primary Decision Maker: 3 children   HCPOA: not documented  SUMMARY OF RECOMMENDATIONS  Code Status/Advance Care Planning: Full code - did not address today as family overwhelmed    Code Status Orders        Start     Ordered   04/09/2015 1619  Full code   Continuous     03/31/2015 1618    Code Status History    Date Active Date Inactive Code Status Order ID Comments User Context   04/09/2015  3:22 PM 04/04/2015  3:24 AM Full Code [id number]  Corrie Mckusick, DO HOV   03/26/2015  9:07 PM 03/30/2015  5:22 PM Full Code [id number]  Reubin Milan, MD Inpatient   10/10/2014  2:35 PM 10/17/2014  9:42 PM Full Code [id number]  Kelvin Cellar, MD Inpatient   09/29/2014 11:40 AM 10/07/2014  6:07 PM Full Code [id number]  Melton Alar, PA-C Inpatient   08/23/2014  1:45 AM 09/21/2014  8:59 PM Full Code [id number]  Lavina Hamman, MD Inpatient   08/07/2014  4:06 PM 08/21/2014  2:15 PM Full Code [id number]  Domenic Polite, MD Inpatient     Symptom Management:   Pain: Agree with increase of morphine 2 mg every 2 hours prn.   Palliative Prophylaxis:   Aspiration, Bowel Regimen, Delirium Protocol, Frequent Pain Assessment, Oral Care and Turn Reposition  Additional Recommendations (Limitations, Scope, Preferences):  Full Scope Treatment  Psycho-social/Spiritual:  Support System:  Strong Desire for further Chaplaincy support:no Additional Recommendations: Caregiving  Support/Resources and Grief/Bereavement Support  Prognosis: Prognosis extremely poor with limited options for improvement and failure to thrive. Very likely <6 months.   Discharge Planning: [place] for rehab with Palliative care service follow-up. Will continue to discuss goals/options.    Chief Complaint/ Primary  Diagnoses: Present on Admission:  . UTI (lower urinary tract infection) . Inflammatory bowel disease . Essential hypertension . FTT (failure to thrive) in adult . Dehydration . Hyponatremia  I have reviewed the medical record, interviewed the patient and family, and examined the patient. The following aspects are pertinent.  Past Medical History  Diagnosis Date  . CAD (coronary artery disease)     unspecified  . Cerebrovascular disease 2010    bil carotid artery disease. left CEA 02/2004. 02/2014 carotid ultrasound: < 40% bil ICA disease.   Marland Kitchen Hyperlipidemia, mixed   . HTN (hypertension)   . Ulcerative colitis 2002  . Colon polyps 2006, 2014  . Thyroid disease   . Myocardial infarct ([place]) 1998  . Diabetes mellitus without complication (HCC)     Borderline  . GERD (gastroesophageal reflux disease)   . Arthritis   . 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   . Depression 10/12/2014  . DVT (deep venous thrombosis) ([place]) 08/29/14    left LE, s/p 08/30/14 IVC filter.    Social History   Social History  . Marital Status: Married    Spouse Name: N/A  . Number of Children: N/A  . Years of Education: N/A   Occupational History  . retired    Social History Main Topics  . Smoking status: Former Smoker -- 0.30 packs/day for 40 years    Types: Cigarettes    Quit date: 02/17/2013  . Smokeless tobacco: Never Used  . Alcohol Use: No  . Drug Use: No  . Sexual Activity: Not Asked   Other Topics Concern  . None   Social History Narrative   Family History  Problem Relation Age of Onset  . Colitis Brother     1 bro with Crohn's, another bro with unspecified colitis.   . Stomach cancer Mother   . Stroke Father     died  . Colon cancer Neg Hx    Scheduled Meds: . atorvastatin  80 mg Oral QHS  . ciprofloxacin  400 mg Intravenous BID  . feeding supplement (ENSURE ENLIVE)  237 mL Oral BID BM  . free water  100 mL Per Tube QID  . lamoTRIgine  25 mg Oral Daily  . mesalamine   4.8 g Oral Daily  . metoprolol tartrate  25 mg Oral BID  . metronidazole  500 mg Intravenous 3 times per day  . mirtazapine  15 mg Oral QHS  . multivitamin with minerals  1 tablet Oral Daily  . pantoprazole  40 mg Oral Q0600  . sertraline  100 mg Oral Daily   Continuous Infusions: . sodium chloride 50 mL/hr at 04/12/15 0833  . feeding supplement (VITAL 1.5 CAL) 1,000 mL (04/12/15 0600)   PRN Meds:.acetaminophen **OR** acetaminophen, ALPRAZolam, morphine injection, ondansetron **OR** ondansetron (ZOFRAN) IV, oxyCODONE Medications Prior to Admission:  Prior to Admission medications   Medication Sig Start Date End Date Taking? Authorizing Provider  acetaminophen (TYLENOL) 500 MG tablet Take 1 tablet (500 mg total) by mouth every 4 (four) hours as needed for mild pain or moderate pain. 10/17/14  Yes Eugenie Filler, MD  ALPRAZolam Duanne Moron) 0.5 MG tablet  Take 1 tablet (0.5 mg total) by mouth 2 (two) times daily as needed for anxiety. Patient taking differently: Take 0.5 mg by mouth daily as needed for anxiety.  03/30/15  Yes Domenic Polite, MD  atorvastatin (LIPITOR) 80 MG tablet Take 80 mg by mouth at bedtime.   Yes Historical Provider, MD  collagenase (SANTYL) ointment Apply 1 application topically daily. To sacrum wound   Yes Historical Provider, MD  ENSURE PLUS (ENSURE PLUS) LIQD Take 237 mLs by mouth 2 (two) times daily.   Yes Historical Provider, MD  HYDROcodone-acetaminophen (NORCO) 5-325 MG tablet Take 1-2 tablets by mouth every 4 (four) hours as needed for moderate pain. 04/09/15  Yes Saverio Danker, PA-C  lamoTRIgine (LAMICTAL) 25 MG tablet Take 25 mg by mouth daily.   Yes Historical Provider, MD  LIALDA 1.2 G EC tablet Take 4.8 g by mouth daily. 08/01/14  Yes Historical Provider, MD  megestrol (MEGACE) 40 MG/ML suspension Take 10 mLs (400 mg total) by mouth daily. 03/30/15  Yes Domenic Polite, MD  metoprolol tartrate (LOPRESSOR) 25 MG tablet Take 1 tablet by mouth 2 (two) times daily.  08/16/14  Yes Historical Provider, MD  mirtazapine (REMERON) 15 MG tablet Take 1 tablet (15 mg total) by mouth at bedtime. 10/17/14  Yes Eugenie Filler, MD  Multiple Vitamin (MULTIVITAMIN WITH MINERALS) TABS tablet Take 1 tablet by mouth daily.   Yes Historical Provider, MD  ondansetron (ZOFRAN) 4 MG tablet Take 1 tablet (4 mg total) by mouth every 6 (six) hours as needed for nausea. Patient taking differently: Take 4 mg by mouth every 8 (eight) hours as needed for nausea.  10/17/14  Yes Eugenie Filler, MD  oxyCODONE (OXY IR/ROXICODONE) 5 MG immediate release tablet Take one tablet by mouth every six hours for pain 03/30/15  Yes Domenic Polite, MD  pantoprazole (PROTONIX) 40 MG tablet Take 1 tablet (40 mg total) by mouth daily at 6 (six) AM. 10/17/14  Yes Eugenie Filler, MD  sertraline (ZOLOFT) 100 MG tablet Take 1 tablet (100 mg total) by mouth daily. 10/17/14  Yes Eugenie Filler, MD   Allergies  Allergen Reactions  . Clindamycin/Lincomycin Diarrhea    Leads to colitis flare  . Ivp Dye [Iodinated Diagnostic Agents] Other (See Comments)    "almost passed out"    Review of Systems  Unable to perform ROS   Physical Exam  Constitutional: She appears well-developed. She appears lethargic. She appears cachectic.  Cardiovascular: Normal rate.   Respiratory: Effort normal. No accessory muscle usage. No tachypnea. No respiratory distress.  Very shallow breathes  GI:  PEG  Neurological: She appears lethargic.  Only moaning in response to stimuli    Vital Signs: BP 115/65 mmHg  Pulse 79  Temp(Src) 97 F (36.1 C) (Axillary)  Resp 15  SpO2 98%  SpO2: SpO2: 98 % O2 Device:SpO2: 98 % O2 Flow Rate: .   IO: Intake/output summary:  Intake/Output Summary (Last 24 hours) at 04/12/15 1350 Last data filed at 04/12/15 1347  Gross per 24 hour  Intake   2300 ml  Output    650 ml  Net   1650 ml    LBM: Last BM Date: 04/12/15 Baseline Weight:   Most recent weight:         Palliative Assessment/Data:  Flowsheet Rows        Most Recent Value   Intake Tab    Referral Department  Hospitalist   Unit at Time of Referral  Med/Surg Unit  Palliative Care Primary Diagnosis  Other (Comment) [GI]   Date Notified  04/12/15   Palliative Care Type  New Palliative care   Reason for referral  Clarify Goals of Care   Date of Admission  04/13/2015   Date first seen by Palliative Care  04/12/15   # of days Palliative referral response time  0 Day(s)   # of days IP prior to Palliative referral  2   Clinical Assessment    Psychosocial & Spiritual Assessment    Palliative Care Outcomes       Additional Data Reviewed:  CBC:    Component Value Date/Time   WBC 10.1 04/12/2015 0533   HGB 11.7* 04/12/2015 0533   HCT 36.7 04/12/2015 0533   PLT 210 04/12/2015 0533   MCV 85.5 04/12/2015 0533   NEUTROABS 10.8* 04/01/2015 1208   LYMPHSABS 0.9 04/01/2015 1208   MONOABS 0.5 04/05/2015 1208   EOSABS 0.0 04/07/2015 1208   BASOSABS 0.0 04/01/2015 1208   Comprehensive Metabolic Panel:    Component Value Date/Time   NA 131* 04/12/2015 0533   K 3.5 04/12/2015 0533   CL 108 04/12/2015 0533   CO2 15* 04/12/2015 0533   BUN 11 04/12/2015 0533   CREATININE 0.57 04/12/2015 0533   CREATININE 1.08 03/28/2014 1022   GLUCOSE 97 04/12/2015 0533   CALCIUM 8.9 04/12/2015 0533   AST 39 03/21/2015 1208   ALT 16 04/02/2015 1208   ALKPHOS 105 03/27/2015 1208   BILITOT 0.3 04/11/2015 1208   PROT 7.4 03/31/2015 1208   ALBUMIN 2.7* 03/21/2015 1208     Time In: 1300 Time Out: 1410 Time Total: 41mn Greater than 50%  of this time was spent counseling and coordinating care related to the above assessment and plan.  Signed by: PPershing Proud NP  APershing Proud NP  27/59/1638 1:50 PM  Please contact Palliative Medicine Team phone at [phone] questions and concerns.

## 2015-04-12 NOTE — Progress Notes (Signed)
[facility] PROGRESS NOTE  [name] [id number] DOB: August 17, 1940 DOA: 03/30/2015  PCP: Kandice Hams, MD  Brief HPI: 75 year old African-American female with multiple comorbidities including Crohn's colitis complicated by perirectal fistula with the progressively worsening failure to thrive, weight loss, who was hospitalized recently for dehydration and hyponatremia. She presents again with the difficulty with taking anything orally and failure to thrive and generalized weakness. She was found to have hyponatremia. She was hospitalized for further management.  Past medical history:  Past Medical History  Diagnosis Date  . CAD (coronary artery disease)     unspecified  . Cerebrovascular disease 2010    bil carotid artery disease. left CEA 02/2004. 02/2014 carotid ultrasound: < 40% bil ICA disease.   Marland Kitchen Hyperlipidemia, mixed   . HTN (hypertension)   . Ulcerative colitis 2002  . Colon polyps 2006, 2014  . Thyroid disease   . Myocardial infarct ([place]) 1998  . Diabetes mellitus without complication (HCC)     Borderline  . GERD (gastroesophageal reflux disease)   . Arthritis   . 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   . Depression 10/12/2014  . DVT (deep venous thrombosis) ([place]) 08/29/14    left LE, s/p 08/30/14 IVC filter.     Consultants: Gastroenterology  Procedures: Underwent PEG tube placement on 2/20 as outpatient  Antibiotics: Placed on ceftriaxone at the time of admission. Changed to ciprofloxacin and Flagyl by gastroenterology 2/22  Subjective: Patient remains confused. Unable to answer any questions appropriately. Appears to be in moderate discomfort.  Objective: Vital Signs  Filed Vitals:   04/11/15 0556 04/11/15 1700 04/11/15 2158 04/12/15 0435  BP: 102/63 120/71 127/83 133/79  Pulse: 99 96 107 88  Temp: 98.3 F (36.8 C) 96.8 F (36 C) 97.3 F (36.3 C) 97.6 F (36.4 C)  TempSrc: Oral Axillary Oral Axillary  Resp: 18 20 22 22   SpO2: 97% 96%  100% 96%    Intake/Output Summary (Last 24 hours) at 04/12/15 0817 Last data filed at 04/12/15 0600  Gross per 24 hour  Intake   2300 ml  Output    500 ml  Net   1800 ml   There were no vitals filed for this visit.  General appearance: alert, distracted and no distress Resp: Diminished air entry at the bases without any crackles or wheezing. Cardio: regular rate and rhythm, S1, S2 normal, no murmur, click, rub or gallop GI: Abdomen is soft. Mildly tender in the lower quadrants without any rebound, rigidity or guarding. Extremities: All extremities noted to be cold. Good capillary refill in the right hand. Neurologic: Awake, alert, confused, disoriented. No focal deficits  Lab Results:  Basic Metabolic Panel:  Recent Labs Lab 04/09/15 1206 03/27/2015 1208 04/11/15 0538 04/12/15 0533  NA 129* 126* 128* 131*  K 4.3 4.4 4.2 3.5  CL 100* 99* 103 108  CO2 17* 17* 17* 15*  GLUCOSE 97 113* 87 97  BUN 11 16 15 11   CREATININE 0.85 0.79 0.57 0.57  CALCIUM 9.9 9.6 9.2 8.9   Liver Function Tests:  Recent Labs Lab 04/16/2015 1208  AST 39  ALT 16  ALKPHOS 105  BILITOT 0.3  PROT 7.4  ALBUMIN 2.7*    Recent Labs Lab 04/07/2015 1208  LIPASE 29   CBC:  Recent Labs Lab 04/09/15 1206 04/06/2015 1208 04/11/15 0004 04/11/15 0538 04/11/15 1240 04/12/15 0533  WBC 7.3 12.2*  --  10.0 10.0 10.1  NEUTROABS  --  10.8*  --   --   --   --  HGB 12.7 12.4 12.3 11.1* 11.3* 11.7*  HCT 39.7 39.4 38.3 34.6* 35.0* 36.7  MCV 82.7 83.5  --  83.0 82.9 85.5  PLT 200 210  --  215 223 210    Recent Results (from the past 240 hour(s))  Urine culture     Status: None (Preliminary result)   Collection Time: 04/17/2015 12:38 PM  Result Value Ref Range Status   Specimen Description URINE, CATHETERIZED  Final   Special Requests NONE  Final   Culture   Final    CULTURE REINCUBATED FOR BETTER GROWTH Performed at [facility]    Report Status PENDING  Incomplete  MRSA PCR Screening      Status: None   Collection Time: 04/04/2015 11:45 PM  Result Value Ref Range Status   MRSA by PCR NEGATIVE NEGATIVE Final    Comment:        The GeneXpert MRSA Assay (FDA approved for NASAL specimens only), is one component of a comprehensive MRSA colonization surveillance program. It is not intended to diagnose MRSA infection nor to guide or monitor treatment for MRSA infections.       Studies/Results: Ct Abdomen Pelvis Wo Contrast  03/30/2015  CLINICAL DATA:  Abdominal pain with generalized weakness. PEG tube placement 04/09/2015 EXAM: CT ABDOMEN AND PELVIS WITHOUT CONTRAST TECHNIQUE: Multidetector CT imaging of the abdomen and pelvis was performed following the standard protocol without IV contrast. COMPARISON:  03/26/2015 FINDINGS: Lower chest and abdominal wall: No abdominal wall hematoma. 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 Extensive atherosclerosis. Hepatobiliary: Negative liver. No evidence of hepatic injury. Cholecystectomy with normal common bile duct diameter Pancreas: Unremarkable. Spleen: Unremarkable. Adrenals/Urinary Tract: Negative adrenals. No hydronephrosis or stone. 22 mm left renal cyst. Hilar calcifications appear arterial. Extensive bladder calcification which appears mobile based on previous imaging and intraluminal/calculi. Reproductive:Hysterectomy. Stomach/Bowel: Percutaneous gastrostomy tube at the level of the gastric body or antrum. The tube is endoluminal. There is small pneumoperitoneum, expected Young procedure. No leakage of oral contrast. No gastric outlet obstruction or wall thickening. Loop ileostomy in the right lower quadrant. There is perirectal fistula at the 9 o'clock position, known from the electronic medical record. Vascular/Lymphatic: No acute vascular abnormality. IVC filter. No mass or adenopathy. Peritoneal: No ascites or pneumoperitoneum. Musculoskeletal: No acute  abnormalities. IMPRESSION: 1. No acute or unexpected finding after percutaneous gastrostomy tube placement. 2. Multiple chronic findings are stable and described above. Electronically Signed   By: Monte Fantasia M.D.   On: 03/29/2015 14:24    Medications:  Scheduled: . atorvastatin  80 mg Oral QHS  . ciprofloxacin  400 mg Intravenous BID  . feeding supplement (ENSURE ENLIVE)  237 mL Oral BID BM  . free water  100 mL Per Tube QID  . lamoTRIgine  25 mg Oral Daily  . mesalamine  4.8 g Oral Daily  . metoprolol tartrate  25 mg Oral BID  . metronidazole  500 mg Intravenous 3 times per day  . mirtazapine  15 mg Oral QHS  . multivitamin with minerals  1 tablet Oral Daily  . pantoprazole  40 mg Oral Q0600  . sertraline  100 mg Oral Daily   Continuous: . sodium chloride 100 mL/hr at 04/11/15 1645  . feeding supplement (VITAL 1.5 CAL) 1,000 mL (04/12/15 0600)   [id number] **OR** acetaminophen, ALPRAZolam, morphine injection, ondansetron **OR** ondansetron (ZOFRAN) IV, oxyCODONE  Assessment/Plan:  Principal Problem:   FTT (failure to thrive) in adult Active Problems:  Essential hypertension   Hyponatremia   Inflammatory bowel disease   UTI (lower urinary tract infection)   Dehydration    Failure to thrive Most likely secondary to her Crohn's disease and poor oral intake. PEG tube was placed by radiology on 2/20. Was started on tube feedings 2/22. Nutritionist has been consulted. Continue IV fluids. Treat her UTI.   Complicated Crohn's disease with bleeding from perianal fistula She was diagnosed with perirectal abscess in July 2016 that required surgical intervention and diverging ileostomy. 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 MRI of pelvis performed on 02/14/2015 show a right lateral transsphincteric perianal fistula coursing superiorly and inferiorly into the gluteal crease. No abscess  identified at the time. During this time she has been treated with biologic agents initially with Remicade. However, she seems to have developed antibodies to this medication. GI was considering other agents. However, considering her poor general health she may not be a candidate for same. Gastroenterology is following. Palliative medicine input is pending. No further bleeding has been noted. Hemoglobin is stable.  Dehydration and hyponatremia Sodium level has improved. Slow down the rate of IV fluids. Continue to monitor closely.  Urinary tract infection UA shows presence of many bacteria with moderate leukocytes. Previous urine cultures growing Klebsiella. Continue ciprofloxacin for now. Repeat cultures are pending.   History of essential hypertension Home medications were held due to hypotension. Blood pressures are stable. Continue to monitor.   History of stage III decubitus ulcer Poor functional status at baseline. Wound care nurse has been consulted.  History of DVT Status Young IVC filter placement on 08/30/2014  History of coronary artery disease status Young coronary artery bypass grafting She does not appear to have acute cardiopulmonary issues. Continue statin. Holding beta blocker due to presence of hypotension.  Goals of care Patient has had a significant decline over the past 6 months, having Crohn's disease with multiple complications. Her functional status is poor and she currently resides at a skilled nursing facility. She recently underwent placement of PEG tube as she has had issues with dehydration, requiring recent hospitalization. Palliative medicine has been consulted. This was also recommended by gastroenterology.   DVT Prophylaxis: No Lovenox due to perianal fistula bleeding. No SCDs due to history of DVT. TED stockings.    Code Status: Full code for now  Family Communication: No family at bedside  Disposition Plan: Await palliative medicine input.     LOS: 2 days    [facility] Pager [phone] 04/12/2015, 8:17 AM  If 7PM-7AM, please contact night-coverage at [email or web address], password [facility]

## 2015-04-12 NOTE — Progress Notes (Signed)
NUTRITION NOTE   RD to manage TF order in place. Pt seen for full assessment 2/22 and RD will follow-up 2/24. Order in place for Vital 1.5 @ 55 mL/hr with slow advancement (10 mL every 8 hours) due to high risk of refeeding.   Pt currently receiving Vital 1.5 @ 35 mL/hr which is providing 1260 kcal, 57 grams of protein, and 642 mL free water. Order is also in place for 100 mL free water via PEG QID but this is not programmed into pump at this time; no free water flush being provided at this time.   Pt sleeping at time of RD visit this AM and no family/visitors present at bedside. Medications reviewed. Labs reviewed; Na: 131 mmol/L and all others WDL.    Jarome Matin, RD, LDN Inpatient Clinical Dietitian Pager # [phone] After hours/weekend pager # [phone]

## 2015-04-12 NOTE — Clinical Documentation Improvement (Signed)
Internal Medicine  Do you agree with the Registered Dietician's assessment of Severe Malnutrition in this patient?   Document Severity - Severe(third degree), Moderate (second degree), Mild (first degree)  Form - [place] (rarely seen in the [place]asm[place], Other Condition, Unable to Determine  Other condition  Unable to clinically determine  Document any associated diagnoses/conditions Cachexia FTT PEG placed 04/09/15  Supporting Information: :  04/11/15 RD Note: "Severe Malnutrition in context of acute illness/injury"   Please exercise your independent, professional judgment when responding. A specific answer is not anticipated or expected.   Thank You, Rolm Gala, RN, [place] [phone]

## 2015-04-13 DIAGNOSIS — E43 Unspecified severe protein-calorie malnutrition: Secondary | ICD-10-CM | POA: Diagnosis present

## 2015-04-13 DIAGNOSIS — R1084 Generalized abdominal pain: Secondary | ICD-10-CM | POA: Insufficient documentation

## 2015-04-13 LAB — CBC
HEMATOCRIT: 34.9 % — AB (ref 36.0–46.0)
HEMOGLOBIN: 11 g/dL — AB (ref 12.0–15.0)
MCH: 26.7 pg (ref 26.0–34.0)
MCHC: 31.5 g/dL (ref 30.0–36.0)
MCV: 84.7 fL (ref 78.0–100.0)
Platelets: 181 10*3/uL (ref 150–400)
RBC: 4.12 MIL/uL (ref 3.87–5.11)
RDW: 18.8 % — ABNORMAL HIGH (ref 11.5–15.5)
WBC: 4.8 10*3/uL (ref 4.0–10.5)

## 2015-04-13 LAB — BASIC METABOLIC PANEL
ANION GAP: 8 (ref 5–15)
BUN: 10 mg/dL (ref 6–20)
CHLORIDE: 108 mmol/L (ref 101–111)
CO2: 15 mmol/L — AB (ref 22–32)
Calcium: 8.2 mg/dL — ABNORMAL LOW (ref 8.9–10.3)
Creatinine, Ser: 0.56 mg/dL (ref 0.44–1.00)
GFR calc non Af Amer: >60 mL/min (ref >60)
Glucose, Bld: 196 mg/dL — ABNORMAL HIGH (ref 65–99)
Potassium: 3.3 mmol/L — ABNORMAL LOW (ref 3.5–5.1)
Sodium: 131 mmol/L — ABNORMAL LOW (ref 135–145)

## 2015-04-13 LAB — GLUCOSE, CAPILLARY
GLUCOSE-CAPILLARY: 149 mg/dL — AB (ref 65–99)
GLUCOSE-CAPILLARY: 154 mg/dL — AB (ref 65–99)

## 2015-04-13 MED ORDER — POTASSIUM CHLORIDE 20 MEQ/15ML (10%) PO SOLN
40.0000 meq | Freq: Once | ORAL | Status: AC
  Administered 2015-04-13: 40 meq
  Filled 2015-04-13: qty 30

## 2015-04-13 MED ORDER — FENTANYL 12 MCG/HR TD PT72
12.5000 ug | MEDICATED_PATCH | TRANSDERMAL | Status: DC
  Administered 2015-04-13: 12.5 ug via TRANSDERMAL
  Filled 2015-04-13: qty 1

## 2015-04-13 NOTE — Care Management Important Message (Signed)
Important Message  Patient Details  Name: [name] MRN: [id number] Date of Birth: 1940-02-25   Medicare Important Message Given:  Yes    Camillo Flaming 04/13/2015, 1:08 PMImportant Message  Patient Details  Name: [name] MRN: [id number] Date of Birth: Jul 17, 1940   Medicare Important Message Given:  Yes    Camillo Flaming 04/13/2015, 1:08 PM

## 2015-04-13 NOTE — Progress Notes (Signed)
OT Cancellation Note  Patient Details Name: [name] MRN: [id number] DOB: Jun 26, 1940   Cancelled Treatment:    Reason Eval/Treat Not Completed: Other (comment) -- patient is from a SNF and the plan is to return to SNF. No acute OT needs -- OT needs can be addressed by OT at SNF. Will sign off.  Arbor Leer A 04/13/2015, 8:55 AM

## 2015-04-13 NOTE — Progress Notes (Signed)
Nutrition Follow-up  DOCUMENTATION CODES:   Severe malnutrition in context of acute illness/injury  INTERVENTION:  - Continue Vital 1.5 @ 55 mL/hr. This regimen provides 1980 kcal, 89 grams of protein, and 1008 mL free water - Order for 100 mL free water QID to provide additional 400 mL free water but this is not infusing; will communicate with RN - RD will continue to monitor for needs  NUTRITION DIAGNOSIS:   Inadequate oral intake related to chronic illness as evidenced by per patient/family report. -ongoing  GOAL:   Patient will meet greater than or equal to 90% of their needs -met with TF regimen alone  MONITOR:   PO intake, TF tolerance, Weight trends, Labs, Skin, I & O's  ASSESSMENT:   75 y.o. 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 She has had progressive failure to thrive over the past 6 months associate with significant weight loss and minimal by mouth intake. She underwent PEG tube placement on 04/09/2015 and plan to initiate tube feeds and maximize nutritional status. She was transferred to the emergency department from her SNF for ongoing generalized weakness, failure to thrive, minimal by mouth intake, state functional decline.   2/24 No new weight since 04/09/15. Pt with PEG and is currently receiving Vital 1.5 @ 55 mL/hr which is her goal rate. Unable to obtain information from pt but family member at bedside states that pt has been having ongoing pain, denies nausea. Pt meeting needs with TF alone. Pt not  consuming PO nutrition at this time despite diet order. Spoke with RN who reports that free water flush has been given manually; she plans to program it into [place] pump related to efficiency.   Medications reviewed. IVF: NS @ 50 mL/hr. Labs reviewed; Na: 131 mmol/L, K: 3.3 mmol/L, Ca: 8.2 mg/dL.    2/22 - Pt seen for TF initiation and management consult.  - Pt on Regular diet with no intakes documented.  - She had PEG placed 04/09/15.  - Pt resting with eye closed at time of RD visit. Family, at bedside, provide all information. - For 1 month PTA pt had very minimal intakes related to poor appetite and discomfort due to IBS.  - Family unsure of any N/V or diarrhea that were associated with intakes PTA.  - Family very concerned about nutritional status and have many questions about TF which were discussed with them.  - Family states that pt has lost 45 lbs in the past 1 month. - Notes indicate pt was prescribed Megace PTA but this did not assist.  - Chart review indicates recorded weights may not be entirely accurate as pt reportedly had fluctuations from 112-217 lbs since 10/19/14.  - Current weight of 169 lbs represent a 34 lb weight gain since 03/14/15, a 2 lb weight loss (1.1% body weight) from 03/09/15 which is not significant, and a 29 lb weight loss (15% body weight) from 01/02/15 which is significant for time frame.  - Unable to do physical assessment at this time per family request.  - Physical assessment was done by another RD 03/28/15 and found mild/moderate muscle and mild/moderate fat wasting to  upper and lower body in addition to severe muscle wasting to hand and calf region.    Diet Order:  Diet regular Room service appropriate?: Yes; Fluid consistency:: Thin  Skin:    Stage 2 ulcers to sacrum and bilateral buttocks, Unstageable ulcer to sacrum, L abdominal incision from PEG placement 04/09/15  Last BM:  2/24  Height:   Ht Readings from Last 1 Encounters:  03/29/2015 5' 8.5"  (1.74 m)    Weight:   Wt Readings from Last 1 Encounters:  04/09/15 169 lb (76.658 kg)    Ideal Body Weight:  64.77 kg (kg)  BMI:  25.38 kg/m2  Estimated Nutritional Needs:   Kcal:  1900-2100 (25-28 kcal/kg)  Protein:  90-100 grams (1.2-1.3 grams/kg)  Fluid:  >/= 2.1 L/day  EDUCATION NEEDS:   No education needs identified at this time     Jarome Matin, RD, LDN Inpatient Clinical Dietitian Pager # [phone] After hours/weekend pager # [phone]

## 2015-04-13 NOTE — Progress Notes (Signed)
PT Cancellation Note  Patient Details Name: [name] MRN: [id number] DOB: 08-20-1940   Cancelled Treatment:    Reason Eval/Treat Not Completed: PT screened, no needs identified, will sign off; spoke with pt family, pt was grossly total care at SNF, she is in    severe pain per her family member and they do not feel PT would be beneficial, PT in agreement;   [facility] 04/13/2015, 12:00 PM

## 2015-04-13 NOTE — Progress Notes (Signed)
Daily Progress Note   Patient Name: [name]       Date: 04/13/2015 DOB: [birth year]  Age: 75 y.o. MRN#: [id number] Attending Physician: Bonnielee Haff, MD Primary Care Physician: Kandice Hams, MD Admit Date: 03/27/2015  Reason for Consultation/Follow-up: Establishing goals of care and Pain control  Subjective: [name] is moaning constantly my enter visit in her room. She has required much morphine today and pain does not seem controlled. Discussed with daughter at bedside a Fentanyl duragesic patch to help with pain - she very much agrees. She also has me update their Reverend via telephone when she calls. Daughter tells me that she is expecting a "miracle from God." Prognosis is extremely poor with really no more options to reverse her condition. Dr. [place] Pink agrees with Fentanyl patch and has updated family per their request on poor prognosis and he also contacted GI to speak with family. I came back to further talk with daughter regarding goals after ordering Fentanyl patch and she was not present.   I called her other daughter, Donnald Garre, and left voicemail updating her on addition of Fentanyl patch and I was hoping to further discuss [place]. Will have my colleague follow up on pain and GOC tomorrow. Please call [phone] for acute needs.    Length of Stay: 3 days  Current Medications: Scheduled Meds:  . atorvastatin  80 mg Oral QHS  . ciprofloxacin  400 mg Intravenous BID  . feeding supplement (ENSURE ENLIVE)  237 mL Oral BID BM  . fentaNYL  12.5 mcg Transdermal Q72H  . free water  100 mL Per Tube QID  . lamoTRIgine  25 mg Oral Daily  . mesalamine  4.8 g Oral Daily  . metoprolol tartrate  25 mg Oral BID  . metronidazole  500 mg Intravenous 3 times per day  . mirtazapine  15 mg  Oral QHS  . multivitamin with minerals  1 tablet Oral Daily  . pantoprazole  40 mg Oral Q0600  . sertraline  100 mg Oral Daily    Continuous Infusions: . sodium chloride 50 mL/hr at 04/12/15 1857  . feeding supplement (VITAL 1.5 CAL) 1,000 mL (04/12/15 2000)    PRN Meds: acetaminophen **OR** acetaminophen, ALPRAZolam, morphine injection, ondansetron **OR** ondansetron (ZOFRAN) IV, oxyCODONE  Physical Exam: Physical Exam  Constitutional: She appears well-developed.  +  Temporal muscle wasting  HENT:  Head: Normocephalic.  Cardiovascular: Normal rate.   Pulmonary/Chest: Effort normal. No accessory muscle usage. No tachypnea. No respiratory distress. She has decreased breath sounds.  Shallow breathes  Abdominal:  PEG, colostomy  Neurological: She is alert. She is disoriented.  Psychiatric:  + Moaning constantly                Vital Signs: BP 122/63 mmHg  Pulse 101  Temp(Src) 97.7 F (36.5 C) (Axillary)  Resp 22  Wt   SpO2 97% SpO2: SpO2: 97 % O2 Device: O2 Device: Not Delivered O2 Flow Rate:    Intake/output summary:  Intake/Output Summary (Last 24 hours) at 04/13/15 1520 Last data filed at 04/13/15 1400  Gross per 24 hour  Intake 3242.5 ml  Output    375 ml  Net 2867.5 ml   LBM: Last BM Date: 04/13/15 Baseline Weight: Weight:  (bed do not have scale) Most recent weight: Weight:  (bed do not have scale)       Palliative Assessment/Data: Flowsheet Rows        Most Recent Value   Intake Tab    Referral Department  Hospitalist   Unit at Time of Referral  Med/Surg Unit   Palliative Care Primary Diagnosis  Other (Comment) [GI]   Date Notified  04/12/15   Palliative Care Type  New Palliative care   Reason for referral  Clarify Goals of Care   Date of Admission  04/14/2015   Date first seen by Palliative Care  04/12/15   # of days Palliative referral response time  0 Day(s)   # of days IP prior to Palliative referral  2   Clinical Assessment    Psychosocial &  Spiritual Assessment    Palliative Care Outcomes       Additional Data Reviewed: CBC    Component Value Date/Time   WBC 4.8 04/13/2015 0551   RBC 4.12 04/13/2015 0551   HGB 11.0* 04/13/2015 0551   HCT 34.9* 04/13/2015 0551   PLT 181 04/13/2015 0551   MCV 84.7 04/13/2015 0551   MCH 26.7 04/13/2015 0551   MCHC 31.5 04/13/2015 0551   RDW 18.8* 04/13/2015 0551   LYMPHSABS 0.9 03/22/2015 1208   MONOABS 0.5 04/05/2015 1208   EOSABS 0.0 04/03/2015 1208   BASOSABS 0.0 04/12/2015 1208    CMP     Component Value Date/Time   NA 131* 04/13/2015 0551   K 3.3* 04/13/2015 0551   CL 108 04/13/2015 0551   CO2 15* 04/13/2015 0551   GLUCOSE 196* 04/13/2015 0551   BUN 10 04/13/2015 0551   CREATININE 0.56 04/13/2015 0551   CREATININE 1.08 03/28/2014 1022   CALCIUM 8.2* 04/13/2015 0551   PROT 7.4 04/09/2015 1208   ALBUMIN 2.7* 04/16/2015 1208   AST 39 04/09/2015 1208   ALT 16 03/23/2015 1208   ALKPHOS 105 03/29/2015 1208   BILITOT 0.3 04/01/2015 1208   GFRNONAA >60 04/13/2015 0551   GFRNONAA 50* 03/28/2014 1022   GFRAA >60 04/13/2015 0551   GFRAA 58* 03/28/2014 1022       Problem List:  Patient Active Problem List   Diagnosis Date Noted  . Severe malnutrition ([place]) 04/13/2015  . Palliative care encounter 04/12/2015  . FTT (failure to thrive) in adult 04/01/2015  . Dehydration 04/01/2015  . Hypomagnesemia 03/27/2015  . Perirectal abscess   . Protein-calorie malnutrition, severe ([place]) 10/12/2014  . Depression 10/12/2014  . Pancolitis ([place])   . UTI (lower urinary tract infection)  10/10/2014  . Pressure ulcer 10/05/2014  . Inflammatory bowel disease   . Malnutrition of moderate degree ([place]) 09/30/2014  . Rectal bleeding 09/29/2014  . GI bleed 09/29/2014  . Elevated lactic acid level   . Rectal fistula   . Fasciitis   . Cold   . DVT (deep venous thrombosis) ([place])   . Acute ulcerative colitis with rectal bleeding ([place])   . Abscess, perirectal s/p I&D 08/23/2014 08/27/2014   . Sepsis ([place]) 08/26/2014  . Infection due to yeast 08/26/2014  . Acute blood loss anemia 08/26/2014  . Leukocytosis 08/26/2014  . Hypokalemia 08/26/2014  . Hyponatremia 08/26/2014  . General weakness 08/26/2014  . Thrombocytopenia ([place]) 08/26/2014  . Essential hypertension 07/11/2008  . Inflammatory bowel disease (IBD) with colitis 07/11/2008     Palliative Care Assessment & Plan    1.Code Status:  Full code    Code Status Orders        Start     Ordered   04/13/2015 1619  Full code   Continuous     04/06/2015 1618    Code Status History    Date Active Date Inactive Code Status Order ID Comments User Context   04/09/2015  3:22 PM 04/15/2015  3:24 AM Full Code [id number]  Corrie Mckusick, DO HOV   03/26/2015  9:07 PM 03/30/2015  5:22 PM Full Code [id number]  Reubin Milan, MD Inpatient   10/10/2014  2:35 PM 10/17/2014  9:42 PM Full Code [id number]  Kelvin Cellar, MD Inpatient   09/29/2014 11:40 AM 10/07/2014  6:07 PM Full Code [id number]  Melton Alar, PA-C Inpatient   08/23/2014  1:45 AM 09/21/2014  8:59 PM Full Code [id number]  Lavina Hamman, MD Inpatient   08/07/2014  4:06 PM 08/21/2014  2:15 PM Full Code [id number]  Domenic Polite, MD Inpatient       2. Goals of Care/Additional Recommendations:  Continue aggressive care per family request. They also want her pain better managed.   Limitations on Scope of Treatment: Full Scope Treatment  Desire for further Chaplaincy support:yes  Psycho-social Needs: Caregiving  Support/Resources, Education on Hospice and Grief/Bereavement Support  3. Symptom Management:      1. Pain: She has received Morphine IV 16 mg over 24 hours. This is equivalent to Fentanyl transdermic 25 mcg but will account for 50% cross tolerance especially accounting for age, frailty. Will add Fentanyl 12.5 mcg tonight. Reassess pain management tomorrow. Continue Morphine 2 mg IV every 2 hours prn for now.   4. Palliative Prophylaxis:   Delirium Protocol,  Frequent Pain Assessment, Oral Care and Turn Reposition  5. Prognosis: Prognosis extremely poor with limited options for improvement and failure to thrive. Very likely <6 months, I believe more likely weeks to months.   6. Discharge Planning:  To be determined.    Care plan was discussed with Dr. Maryland Pink  Thank you for allowing the Palliative Medicine Team to assist in the care of this patient.   Time In: 1430 Time Out: 1500 Total Time 46min Prolonged Time Billed  no         Pershing Proud, NP  [id number], 3:20 PM  Please contact Palliative Medicine Team phone at [phone] for questions and concerns.

## 2015-04-13 NOTE — Progress Notes (Signed)
[facility] PROGRESS NOTE  [name] [id number] DOB: Aug 22, 1940 DOA: 03/28/2015  PCP: Kandice Hams, MD  Brief HPI: 75 year old African-American female with multiple comorbidities including Crohn's colitis complicated by perirectal fistula with the progressively worsening failure to thrive, weight loss, who was hospitalized recently for dehydration and hyponatremia. She presents again with the difficulty with taking anything orally and failure to thrive and generalized weakness. She was found to have hyponatremia. She was hospitalized for further management.  Past medical history:  Past Medical History  Diagnosis Date  . CAD (coronary artery disease)     unspecified  . Cerebrovascular disease 2010    bil carotid artery disease. left CEA 02/2004. 02/2014 carotid ultrasound: < 40% bil ICA disease.   [place] Hyperlipidemia, mixed   . HTN (hypertension)   . Ulcerative colitis 2002  . Colon polyps 2006, 2014  . Thyroid disease   . Myocardial infarct ([place]) 1998  . Diabetes mellitus without complication (HCC)     Borderline  . GERD (gastroesophageal reflux disease)   . Arthritis   . 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   . Depression 10/12/2014  . DVT (deep venous thrombosis) ([place]) 08/29/14    left LE, s/p 08/30/14 IVC filter.     Consultants: Gastroenterology, palliative medicine  Procedures: Underwent PEG tube placement on 2/20 as outpatient  Antibiotics: Placed on ceftriaxone at the time of admission. Changed to ciprofloxacin and Flagyl by gastroenterology 2/22  Subjective: Patient remains confused. Appears to be in some discomfort, although she is not able to clearly explain her symptoms. She says she hurts all over.   Objective: Vital Signs  Filed Vitals:   04/12/15 1100 04/12/15 1300 04/12/15 2253 04/13/15 0634  BP:  110/65 124/68 122/63  Pulse:  88 105 101  Temp:  97.9 F (36.6 C) 98.4 F (36.9 C) 97.7 F (36.5 C)  TempSrc:  Axillary Axillary  Axillary  Resp:  16 20 22   SpO2: 98% 98% 97% 97%    Intake/Output Summary (Last 24 hours) at 04/13/15 [id number] Last data filed at 04/13/15 0700  Gross per 24 hour  Intake 3032.5 ml  Output    325 ml  Net 2707.5 ml   Filed Weights    General appearance: alert, distracted and no distress Resp: Diminished air entry at the bases without any crackles or wheezing. Cardio: regular rate and rhythm, S1, S2 normal, no murmur, click, rub or gallop GI: Abdomen is soft. Mildly tender in the lower quadrants without any rebound, rigidity or guarding. Extremities: Extremities have improved this morning. Not as cold to touch as yesterday. Good pulses. Neurologic: Awake, alert, confused, disoriented. No focal deficits  Lab Results:  Basic Metabolic Panel:  Recent Labs Lab 04/09/15 1206 03/28/2015 1208 04/11/15 0538 04/12/15 0533 04/13/15 0551  NA 129* 126* 128* 131* 131*  K 4.3 4.4 4.2 3.5 3.3*  CL 100* 99* 103 108 108  CO2 17* 17* 17* 15* 15*  GLUCOSE 97 113* 87 97 196*  BUN 11 16 15 11 10   CREATININE 0.85 0.79 0.57 0.57 0.56  CALCIUM 9.9 9.6 9.2 8.9 8.2*   Liver Function Tests:  Recent Labs Lab 03/23/2015 1208  AST 39  ALT 16  ALKPHOS 105  BILITOT 0.3  PROT 7.4  ALBUMIN 2.7*    Recent Labs Lab 03/23/2015 1208  LIPASE 29   CBC:  Recent Labs Lab 04/15/2015 1208 04/11/15 0004 04/11/15 0538 04/11/15 1240 04/12/15 0533 04/13/15 0551  WBC 12.2*  --  10.0  10.0 10.1 4.8  NEUTROABS 10.8*  --   --   --   --   --   HGB 12.4 12.3 11.1* 11.3* 11.7* 11.0*  HCT 39.4 38.3 34.6* 35.0* 36.7 34.9*  MCV 83.5  --  83.0 82.9 85.5 84.7  PLT 210  --  215 223 210 181    Recent Results (from the past 240 hour(s))  Urine culture     Status: None (Preliminary result)   Collection Time: 04/13/2015 12:38 PM  Result Value Ref Range Status   Specimen Description URINE, CATHETERIZED  Final   Special Requests NONE  Final   Culture   Final    >=100,000 COLONIES/mL ENTEROCOCCUS  SPECIES SUSCEPTIBILITIES TO FOLLOW Performed at [facility]    Report Status PENDING  Incomplete  MRSA PCR Screening     Status: None   Collection Time: 04/04/2015 11:45 PM  Result Value Ref Range Status   MRSA by PCR NEGATIVE NEGATIVE Final    Comment:        The GeneXpert MRSA Assay (FDA approved for NASAL specimens only), is one component of a comprehensive MRSA colonization surveillance program. It is not intended to diagnose MRSA infection nor to guide or monitor treatment for MRSA infections.       Studies/Results: No results found.  Medications:  Scheduled: . atorvastatin  80 mg Oral QHS  . ciprofloxacin  400 mg Intravenous BID  . feeding supplement (ENSURE ENLIVE)  237 mL Oral BID BM  . free water  100 mL Per Tube QID  . lamoTRIgine  25 mg Oral Daily  . mesalamine  4.8 g Oral Daily  . metoprolol tartrate  25 mg Oral BID  . metronidazole  500 mg Intravenous 3 times per day  . mirtazapine  15 mg Oral QHS  . multivitamin with minerals  1 tablet Oral Daily  . pantoprazole  40 mg Oral Q0600  . potassium chloride  40 mEq Per Tube Once  . sertraline  100 mg Oral Daily   Continuous: . sodium chloride 50 mL/hr at 04/12/15 1857  . feeding supplement (VITAL 1.5 CAL) 1,000 mL (04/12/15 2000)   [id number] **OR** acetaminophen, ALPRAZolam, morphine injection, ondansetron **OR** ondansetron (ZOFRAN) IV, oxyCODONE  Assessment/Plan:  Principal Problem:   FTT (failure to thrive) in adult Active Problems:   Essential hypertension   Hyponatremia   Rectal fistula   Inflammatory bowel disease   UTI (lower urinary tract infection)   Dehydration   Palliative care encounter    Failure to thrive Most likely secondary to her Crohn's disease and poor oral intake. PEG tube was placed by radiology on 2/20. Was started on tube feedings 2/22. Continue IV fluids. Treat her UTI.   Complicated Crohn's disease with bleeding from perianal fistula She was diagnosed  with perirectal abscess in July 2016 that required surgical intervention and diverging ileostomy. 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 MRI of pelvis performed on 02/14/2015 show a right lateral transsphincteric perianal fistula coursing superiorly and inferiorly into the gluteal crease. No abscess identified at the time. During this time she has been treated with biologic agents initially with Remicade. However, she seems to have developed antibodies to this medication. GI was considering other agents. However, considering her poor general health she may not be a candidate for same. Patient continues to have on and off bleeding from her fistula. Gastroenterology is  following. Palliative medicine has discussed with the family.  Dehydration and hyponatremia Sodium level has improved. Slow down the rate of IV fluids. Continue to monitor closely.  Urinary tract infection UA shows presence of many bacteria with moderate leukocytes. Urine cultures growing enterococcus. Sensitivities are pending. Previous urine cultures growing Klebsiella. Continue ciprofloxacin for now.   History of essential hypertension Home medications were held due to hypotension. Blood pressures are stable. Continue to monitor.   History of stage III decubitus ulcer Poor functional status at baseline. Wound care nurse has been consulted.  History of DVT Status post IVC filter placement on 08/30/2014  History of coronary artery disease status post coronary artery bypass grafting She does not appear to have acute cardiopulmonary issues. Continue statin. Holding beta blocker due to presence of hypotension.  Severe Malnutrition in context of acute illness/injury Currently on tube feedings.  Goals of care Patient has had a significant decline over the past 6 months, having Crohn's disease with multiple complications. Her functional status is poor  and she currently resides at a skilled nursing facility. She recently underwent placement of PEG tube as she has had issues with dehydration, requiring recent hospitalization. She also appears to be in some discomfort. Palliative medicine is following.   DVT Prophylaxis: No Lovenox due to perianal fistula bleeding. No SCDs due to history of DVT. TED stockings.    Code Status: Full code for now  Family Communication: Discussed with her daughter yesterday. Disposition Plan: PT and OT evaluation. Continue IV antibiotics for now.    LOS: 3 days   [facility] Pager [phone] 04/13/2015, 8:08 AM  If 7PM-7AM, please contact night-coverage at [email or web address], password [facility]

## 2015-04-13 NOTE — Progress Notes (Signed)
   04/13/15 1200  Clinical Encounter Type  Visited With Patient not available;Health care provider  Visit Type Initial  Stress Factors  Family Stress Factors Loss;Loss of control;Major life changes ([place])  [facility] attempted to visit pt, who was sleeping; daughter not in room; [facility] plans follow-up to aid in adjustment of family to diagnosis as per Palliative Care (AP) note; [place] discussed concerns and reiterated that family not accepting of diagnosis.  [place] will support Palliative Team, as needed. Gwynn Burly 12:16 PM

## 2015-04-14 DIAGNOSIS — Z789 Other specified health status: Secondary | ICD-10-CM

## 2015-04-14 DIAGNOSIS — E43 Unspecified severe protein-calorie malnutrition: Secondary | ICD-10-CM

## 2015-04-14 LAB — CBC
HCT: 33.7 % — ABNORMAL LOW (ref 36.0–46.0)
Hemoglobin: 10.7 g/dL — ABNORMAL LOW (ref 12.0–15.0)
MCH: 27 pg (ref 26.0–34.0)
MCHC: 31.8 g/dL (ref 30.0–36.0)
MCV: 84.9 fL (ref 78.0–100.0)
PLATELETS: 184 10*3/uL (ref 150–400)
RBC: 3.97 MIL/uL (ref 3.87–5.11)
RDW: 19 % — ABNORMAL HIGH (ref 11.5–15.5)
WBC: 7.2 10*3/uL (ref 4.0–10.5)

## 2015-04-14 LAB — BASIC METABOLIC PANEL
Anion gap: 6 (ref 5–15)
BUN: 8 mg/dL (ref 6–20)
CO2: 18 mmol/L — ABNORMAL LOW (ref 22–32)
Calcium: 8.2 mg/dL — ABNORMAL LOW (ref 8.9–10.3)
Chloride: 108 mmol/L (ref 101–111)
Creatinine, Ser: 0.46 mg/dL (ref 0.44–1.00)
GLUCOSE: 178 mg/dL — AB (ref 65–99)
POTASSIUM: 3.7 mmol/L (ref 3.5–5.1)
SODIUM: 132 mmol/L — AB (ref 135–145)

## 2015-04-14 LAB — GLUCOSE, CAPILLARY
GLUCOSE-CAPILLARY: 154 mg/dL — AB (ref 65–99)
GLUCOSE-CAPILLARY: 165 mg/dL — AB (ref 65–99)
GLUCOSE-CAPILLARY: 138 mg/dL — AB (ref 65–99)
GLUCOSE-CAPILLARY: 123 mg/dL — AB (ref 65–99)
Glucose-Capillary: 139 mg/dL — ABNORMAL HIGH (ref 65–99)
Glucose-Capillary: 163 mg/dL — ABNORMAL HIGH (ref 65–99)

## 2015-04-14 LAB — URINE CULTURE: Culture: >=100000

## 2015-04-14 LAB — MRSA PCR SCREENING: MRSA BY PCR: NEGATIVE

## 2015-04-14 MED ORDER — SERTRALINE HCL 50 MG PO TABS
50.0000 mg | ORAL_TABLET | Freq: Every day | ORAL | Status: DC
  Administered 2015-04-15 – 2015-04-16 (×2): 50 mg via ORAL
  Filled 2015-04-14 (×4): qty 1

## 2015-04-14 MED ORDER — HYDROMORPHONE HCL 1 MG/ML IJ SOLN
1.0000 mg | INTRAMUSCULAR | Status: DC | PRN
  Administered 2015-04-14 – 2015-04-17 (×14): 1 mg via INTRAVENOUS
  Filled 2015-04-14 (×17): qty 1

## 2015-04-14 MED ORDER — VITAL 1.5 CAL PO LIQD
1000.0000 mL | ORAL | Status: DC
Start: 2015-04-14 — End: 2015-04-17
  Administered 2015-04-14 – 2015-04-16 (×2): 1000 mL
  Filled 2015-04-14 (×3): qty 1000

## 2015-04-14 MED ORDER — ALPRAZOLAM 1 MG PO TABS
1.0000 mg | ORAL_TABLET | Freq: Three times a day (TID) | ORAL | Status: DC | PRN
  Administered 2015-04-14 – 2015-04-16 (×3): 1 mg via ORAL
  Filled 2015-04-14 (×3): qty 1

## 2015-04-14 MED ORDER — FENTANYL 25 MCG/HR TD PT72
25.0000 ug | MEDICATED_PATCH | TRANSDERMAL | Status: DC
  Administered 2015-04-16 – 2015-04-22 (×3): 25 ug via TRANSDERMAL
  Filled 2015-04-14 (×3): qty 1

## 2015-04-14 MED ORDER — LAMOTRIGINE 100 MG PO TABS
100.0000 mg | ORAL_TABLET | Freq: Two times a day (BID) | ORAL | Status: DC
  Administered 2015-04-14: 100 mg via ORAL
  Filled 2015-04-14 (×3): qty 1

## 2015-04-14 NOTE — Progress Notes (Signed)
Utilization review completed.  

## 2015-04-14 NOTE — Progress Notes (Signed)
[facility] PROGRESS NOTE  [name] [id number] DOB: 31-Aug-1940 DOA: 03/30/2015  PCP: Kandice Hams, MD  Brief HPI: 75 year old African-American female with multiple comorbidities including Crohn's colitis complicated by perirectal fistula with the progressively worsening failure to thrive, weight loss, who was hospitalized recently for dehydration and hyponatremia. She presents again with the difficulty with taking anything orally and failure to thrive and generalized weakness. She was found to have hyponatremia. She was hospitalized for further management. Palliative medicine and gastroenterology was consulted.  Past medical history:  Past Medical History  Diagnosis Date  . CAD (coronary artery disease)     unspecified  . Cerebrovascular disease 2010    bil carotid artery disease. left CEA 02/2004. 02/2014 carotid ultrasound: < 40% bil ICA disease.   Marland Kitchen Hyperlipidemia, mixed   . HTN (hypertension)   . Ulcerative colitis 2002  . Colon polyps 2006, 2014  . Thyroid disease   . Myocardial infarct ([place]) 1998  . Diabetes mellitus without complication (HCC)     Borderline  . GERD (gastroesophageal reflux disease)   . Arthritis   . 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   . Depression 10/12/2014  . DVT (deep venous thrombosis) ([place]) 08/29/14    left LE, s/p 08/30/14 IVC filter.     Consultants: Gastroenterology, palliative medicine  Procedures: Underwent PEG tube placement on 2/20 as outpatient  Antibiotics: Placed on ceftriaxone at the time of admission. Changed to ciprofloxacin and Flagyl by gastroenterology 2/22  Subjective: Patient remains about the same. Still confused. Not in as much discomfort as yesterday.   Objective: Vital Signs  Filed Vitals:   04/13/15 0634 04/13/15 1629 04/13/15 2240 04/14/15 0411  BP: 122/63 113/66 119/65 106/66  Pulse: 101 115 120 98  Temp: 97.7 F (36.5 C) 98.1 F (36.7 C) 97.6 F (36.4 C) 98.3 F (36.8 C)  TempSrc:  Axillary Axillary Oral Oral  Resp: 22 20 22 22   SpO2: 97% 96% 93% 95%    Intake/Output Summary (Last 24 hours) at 04/14/15 0820 Last data filed at 04/14/15 0600  Gross per 24 hour  Intake   1550 ml  Output   1050 ml  Net    500 ml   Filed Weights    General appearance: alert, distracted and no distress Resp: Diminished air entry at the bases without any crackles or wheezing. Cardio: regular rate and rhythm, S1, S2 normal, no murmur, click, rub or gallop GI: Abdomen is soft. Mildly tender in the lower quadrants without any rebound, rigidity or guarding. Extremities: Extremities not cold to touch. Good pulses. Neurologic: Awake, alert, confused, disoriented. No focal deficits  Lab Results:  Basic Metabolic Panel:  Recent Labs Lab 04/09/2015 1208 04/11/15 0538 04/12/15 0533 04/13/15 0551 04/14/15 0549  NA 126* 128* 131* 131* 132*  K 4.4 4.2 3.5 3.3* 3.7  CL 99* 103 108 108 108  CO2 17* 17* 15* 15* 18*  GLUCOSE 113* 87 97 196* 178*  BUN 16 15 11 10 8   CREATININE 0.79 0.57 0.57 0.56 0.46  CALCIUM 9.6 9.2 8.9 8.2* 8.2*   Liver Function Tests:  Recent Labs Lab 03/30/2015 1208  AST 39  ALT 16  ALKPHOS 105  BILITOT 0.3  PROT 7.4  ALBUMIN 2.7*    Recent Labs Lab 04/12/2015 1208  LIPASE 29   CBC:  Recent Labs Lab 03/28/2015 1208  04/11/15 0538 04/11/15 1240 04/12/15 0533 04/13/15 0551 04/14/15 0549  WBC 12.2*  --  10.0 10.0 10.1 4.8 7.2  NEUTROABS 10.8*  --   --   --   --   --   --   HGB 12.4  < > 11.1* 11.3* 11.7* 11.0* 10.7*  HCT 39.4  < > 34.6* 35.0* 36.7 34.9* 33.7*  MCV 83.5  --  83.0 82.9 85.5 84.7 84.9  PLT 210  --  215 223 210 181 184  < > = values in this interval not displayed.  Recent Results (from the past 240 hour(s))  Urine culture     Status: None (Preliminary result)   Collection Time: 04/14/2015 12:38 PM  Result Value Ref Range Status   Specimen Description URINE, CATHETERIZED  Final   Special Requests NONE  Final   Culture   Final     >=100,000 COLONIES/mL ENTEROCOCCUS SPECIES REPEATING SENSITIVITIES TO RULE OUT THE PRESENCE OF VRE Performed at [facility]    Report Status PENDING  Incomplete  MRSA PCR Screening     Status: None   Collection Time: 04/13/2015 11:45 PM  Result Value Ref Range Status   MRSA by PCR NEGATIVE NEGATIVE Final    Comment:        The GeneXpert MRSA Assay (FDA approved for NASAL specimens only), is one component of a comprehensive MRSA colonization surveillance program. It is not intended to diagnose MRSA infection nor to guide or monitor treatment for MRSA infections.       Studies/Results: No results found.  Medications:  Scheduled: . atorvastatin  80 mg Oral QHS  . ciprofloxacin  400 mg Intravenous BID  . feeding supplement (ENSURE ENLIVE)  237 mL Oral BID BM  . fentaNYL  12.5 mcg Transdermal Q72H  . free water  100 mL Per Tube QID  . lamoTRIgine  25 mg Oral Daily  . mesalamine  4.8 g Oral Daily  . metoprolol tartrate  25 mg Oral BID  . metronidazole  500 mg Intravenous 3 times per day  . mirtazapine  15 mg Oral QHS  . multivitamin with minerals  1 tablet Oral Daily  . pantoprazole  40 mg Oral Q0600  . sertraline  100 mg Oral Daily   Continuous: . sodium chloride 50 mL/hr at 04/13/15 2242  . feeding supplement (VITAL 1.5 CAL) 1,000 mL (04/14/15 0419)   [id number] **OR** acetaminophen, ALPRAZolam, morphine injection, ondansetron **OR** ondansetron (ZOFRAN) IV, oxyCODONE  Assessment/Plan:  Principal Problem:   FTT (failure to thrive) in adult Active Problems:   Essential hypertension   Hyponatremia   Rectal fistula   Inflammatory bowel disease   UTI (lower urinary tract infection)   Dehydration   Palliative care encounter   Severe malnutrition (HCC)   Pain, abdominal, generalized    Failure to thrive Most likely secondary to her Crohn's disease and poor oral intake. PEG tube was placed by radiology on 2/20. Was started on tube feedings 2/22.  Continue IV fluids. Treat her UTI. Patient has not been making any progress. This is most likely due to her complicated underlying disease processes. Prognosis appears to be poor.  Complicated Crohn's disease with bleeding from perianal fistula She was diagnosed with perirectal abscess in July 2016 that required surgical intervention and diverging ileostomy. 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 MRI of pelvis performed on 02/14/2015 show a right lateral transsphincteric perianal fistula coursing superiorly and inferiorly into the gluteal crease. No abscess identified at the time. During this time she  has been treated with biologic agents initially with Remicade. However, she seems to have developed antibodies to this medication. GI was considering other agents. However, considering her poor general health she may not be a candidate for same. Patient continues to have on and off bleeding from her fistula. Gastroenterology is following. Crohn's does not appear to be active based on CT scan findings.   Dehydration and hyponatremia Sodium level has improved. Slow down the rate of IV fluids. Continue to monitor closely.  Urinary tract infection UA shows presence of many bacteria with moderate leukocytes. Urine cultures growing enterococcus. Sensitivities are pending. Previous urine cultures growing Klebsiella. Continue ciprofloxacin for now.   History of essential hypertension Home medications were held due to hypotension. Blood pressures are stable. Continue to monitor.   History of stage III decubitus ulcer Poor functional status at baseline. Wound care nurse has been consulted.  History of DVT Status post IVC filter placement on 08/30/2014  History of coronary artery disease status post coronary artery bypass grafting She does not appear to have acute cardiopulmonary issues. Continue statin. Holding beta blocker due  to presence of hypotension.  Severe Malnutrition in context of acute illness/injury Currently on tube feedings.  Goals of care Patient has had a significant decline over the past 6 months, having Crohn's disease with multiple complications. Her functional status is poor and she currently resides at a skilled nursing facility. She recently underwent placement of PEG tube as she has had issues with dehydration, requiring recent hospitalization. She also appears to be in some discomfort. Fentanyl patch was initiated yesterday. Palliative medicine is following. Family appears to be in some denial regarding patient's condition.  DVT Prophylaxis: No Lovenox due to perianal fistula bleeding. No SCDs due to history of DVT. TED stockings.    Code Status: Full code for now  Family Communication: No family at bedside today. Disposition Plan: Continue IV antibiotics for now. Change once final sensitivities are available. Goals for this hospitalization appears to be better pain control and improvement in mental status. Palliative medicine to continue discussion with family as prognosis does appear to be quite poor and patient will definitely benefit from hospice.    LOS: 4 days   [facility] Pager [phone] 04/14/2015, 8:20 AM  If 7PM-7AM, please contact night-coverage at [email or web address], password [facility]

## 2015-04-15 DIAGNOSIS — A498 Other bacterial infections of unspecified site: Secondary | ICD-10-CM

## 2015-04-15 DIAGNOSIS — L8993 Pressure ulcer of unspecified site, stage 3: Secondary | ICD-10-CM | POA: Diagnosis present

## 2015-04-15 DIAGNOSIS — Z1621 Resistance to vancomycin: Secondary | ICD-10-CM

## 2015-04-15 DIAGNOSIS — B952 Enterococcus as the cause of diseases classified elsewhere: Secondary | ICD-10-CM

## 2015-04-15 LAB — GLUCOSE, CAPILLARY
GLUCOSE-CAPILLARY: 155 mg/dL — AB (ref 65–99)
GLUCOSE-CAPILLARY: 172 mg/dL — AB (ref 65–99)
GLUCOSE-CAPILLARY: 145 mg/dL — AB (ref 65–99)
GLUCOSE-CAPILLARY: 76 mg/dL (ref 65–99)
GLUCOSE-CAPILLARY: 92 mg/dL (ref 65–99)

## 2015-04-15 MED ORDER — LINEZOLID 600 MG PO TABS
600.0000 mg | ORAL_TABLET | Freq: Two times a day (BID) | ORAL | Status: DC
  Administered 2015-04-15 – 2015-04-16 (×4): 600 mg
  Filled 2015-04-15 (×8): qty 1

## 2015-04-15 MED ORDER — LAMOTRIGINE 100 MG PO TABS
100.0000 mg | ORAL_TABLET | Freq: Every day | ORAL | Status: DC
  Administered 2015-04-15 – 2015-04-16 (×2): 100 mg via ORAL
  Filled 2015-04-15 (×4): qty 1

## 2015-04-15 NOTE — Progress Notes (Signed)
[facility] PROGRESS NOTE  [name] [id number] DOB: 04-10-1940 DOA: 04/04/2015  PCP: Kandice Hams, MD  Brief HPI: 75 year old African-American female with multiple comorbidities including Crohn's colitis complicated by perirectal fistula with the progressively worsening failure to thrive, weight loss, who was hospitalized recently for dehydration and hyponatremia. She presents again with the difficulty with taking anything orally and failure to thrive and generalized weakness. She was found to have hyponatremia. She was hospitalized for further management. Palliative medicine and gastroenterology was consulted.  Past medical history:  Past Medical History  Diagnosis Date  . CAD (coronary artery disease)     unspecified  . Cerebrovascular disease 2010    bil carotid artery disease. left CEA 02/2004. 02/2014 carotid ultrasound: < 40% bil ICA disease.   Marland Kitchen Hyperlipidemia, mixed   . HTN (hypertension)   . Ulcerative colitis 2002  . Colon polyps 2006, 2014  . Thyroid disease   . Myocardial infarct ([place]) 1998  . Diabetes mellitus without complication (HCC)     Borderline  . GERD (gastroesophageal reflux disease)   . Arthritis   . 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   . Depression 10/12/2014  . DVT (deep venous thrombosis) ([place]) 08/29/14    left LE, s/p 08/30/14 IVC filter.     Consultants: Gastroenterology, palliative medicine  Procedures: Underwent PEG tube placement on 2/20 as outpatient  Antibiotics: Placed on ceftriaxone at the time of admission. Changed to ciprofloxacin and Flagyl by gastroenterology 2/22 Ciprofloxacin stopped on 2/26 Linezolid initiated 2/26  Subjective: Patient continues to be lethargic. She is moaning and groaning. Unable to provide much information.   Objective: Vital Signs  Filed Vitals:   04/14/15 0411 04/14/15 1400 04/14/15 2141 04/15/15 0429  BP: 106/66 99/55 106/67 114/68  Pulse: 98 95 115 97  Temp: 98.3 F (36.8 C)  97.8 F (36.6 C) 98.2 F (36.8 C) 98.1 F (36.7 C)  TempSrc: Oral Oral Axillary Axillary  Resp: 22 20 16 20   SpO2: 95% 98% 94% 97%    Intake/Output Summary (Last 24 hours) at 04/15/15 0805 Last data filed at 04/15/15 0600  Gross per 24 hour  Intake   1495 ml  Output   1075 ml  Net    420 ml   Filed Weights    General appearance: Lethargic Resp: Diminished air entry at the bases without any crackles or wheezing. Cardio: regular rate and rhythm, S1, S2 normal, no murmur, click, rub or gallop GI: Abdomen is soft. Mildly tender in the lower quadrants without any rebound, rigidity or guarding. Extremities: Extremities not cold to touch. Good pulses. Neurologic: Lethargic. Arousable. Moving her extremities. Squeezes my fingers.  Lab Results:  Basic Metabolic Panel:  Recent Labs Lab 04/09/2015 1208 04/11/15 0538 04/12/15 0533 04/13/15 0551 04/14/15 0549  NA 126* 128* 131* 131* 132*  K 4.4 4.2 3.5 3.3* 3.7  CL 99* 103 108 108 108  CO2 17* 17* 15* 15* 18*  GLUCOSE 113* 87 97 196* 178*  BUN 16 15 11 10 8   CREATININE 0.79 0.57 0.57 0.56 0.46  CALCIUM 9.6 9.2 8.9 8.2* 8.2*   Liver Function Tests:  Recent Labs Lab 04/07/2015 1208  AST 39  ALT 16  ALKPHOS 105  BILITOT 0.3  PROT 7.4  ALBUMIN 2.7*    Recent Labs Lab 04/11/2015 1208  LIPASE 29   CBC:  Recent Labs Lab 04/06/2015 1208  04/11/15 0538 04/11/15 1240 04/12/15 0533 04/13/15 0551 04/14/15 0549  WBC 12.2*  --  10.0 10.0 10.1 4.8 7.2  NEUTROABS 10.8*  --   --   --   --   --   --   HGB 12.4  < > 11.1* 11.3* 11.7* 11.0* 10.7*  HCT 39.4  < > 34.6* 35.0* 36.7 34.9* 33.7*  MCV 83.5  --  83.0 82.9 85.5 84.7 84.9  PLT 210  --  215 223 210 181 184  < > = values in this interval not displayed.  Recent Results (from the past 240 hour(s))  Urine culture     Status: None   Collection Time: 04/12/2015 12:38 PM  Result Value Ref Range Status   Specimen Description URINE, CATHETERIZED  Final   Special Requests NONE   Final   Culture   Final    >=100,000 COLONIES/mL VANCOMYCIN RESISTANT ENTEROCOCCUS ISOLATED Performed at [facility]    Report Status 04/14/2015 FINAL  Final   Organism ID, Bacteria VANCOMYCIN RESISTANT ENTEROCOCCUS ISOLATED  Final      Susceptibility   Vancomycin resistant enterococcus isolated - MIC*    AMPICILLIN >=32 RESISTANT Resistant     LEVOFLOXACIN >=8 RESISTANT Resistant     NITROFURANTOIN 256 RESISTANT Resistant     VANCOMYCIN >=32 RESISTANT Resistant     LINEZOLID 2 SENSITIVE Sensitive     * >=100,000 COLONIES/mL VANCOMYCIN RESISTANT ENTEROCOCCUS ISOLATED  MRSA PCR Screening     Status: None   Collection Time: 03/30/2015 11:45 PM  Result Value Ref Range Status   MRSA by PCR NEGATIVE NEGATIVE Final    Comment:        The GeneXpert MRSA Assay (FDA approved for NASAL specimens only), is one component of a comprehensive MRSA colonization surveillance program. It is not intended to diagnose MRSA infection nor to guide or monitor treatment for MRSA infections.   MRSA PCR Screening     Status: None   Collection Time: 04/14/15  8:44 AM  Result Value Ref Range Status   MRSA by PCR NEGATIVE NEGATIVE Final    Comment:        The GeneXpert MRSA Assay (FDA approved for NASAL specimens only), is one component of a comprehensive MRSA colonization surveillance program. It is not intended to diagnose MRSA infection nor to guide or monitor treatment for MRSA infections.       Studies/Results: No results found.  Medications:  Scheduled: . feeding supplement (ENSURE ENLIVE)  237 mL Oral BID BM  . [START ON 04/16/2015] fentaNYL  25 mcg Transdermal Q72H  . free water  100 mL Per Tube QID  . lamoTRIgine  100 mg Oral Daily  . linezolid  600 mg Per Tube Q12H  . mesalamine  4.8 g Oral Daily  . metoprolol tartrate  25 mg Oral BID  . metronidazole  500 mg Intravenous 3 times per day  . pantoprazole  40 mg Oral Q0600  . sertraline  50 mg Oral Daily    Continuous: . sodium chloride 30 mL/hr at 04/15/15 0559  . feeding supplement (VITAL 1.5 CAL) 1,000 mL (04/14/15 2142)   [id number] **OR** acetaminophen, ALPRAZolam, HYDROmorphone (DILAUDID) injection, ondansetron **OR** ondansetron (ZOFRAN) IV  Assessment/Plan:  Principal Problem:   FTT (failure to thrive) in adult Active Problems:   Essential hypertension   Hyponatremia   Rectal fistula   Inflammatory bowel disease   UTI (lower urinary tract infection)   Dehydration   Palliative care encounter   Severe malnutrition (HCC)   Pain, abdominal, generalized    Failure to thrive Most likely secondary to  her Crohn's disease and poor oral intake and UTI. PEG tube was placed by radiology on 2/20. Was started on tube feedings 2/22. Continue IV fluids. Patient has not been making any progress. This is most likely due to her complicated underlying disease processes. Prognosis appears to be poor.  Complicated Crohn's disease with bleeding from perianal fistula She was diagnosed with perirectal abscess in July 2016 that required surgical intervention and diverging ileostomy. 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 MRI of pelvis performed on 02/14/2015 show a right lateral transsphincteric perianal fistula coursing superiorly and inferiorly into the gluteal crease. No abscess identified at the time. During this time she has been treated with biologic agents initially with Remicade. However, she seems to have developed antibodies to this medication. GI was considering other agents. However, considering her poor general health she may not be a candidate for same. Patient continues to have on and off bleeding from her fistula. Gastroenterology is following. Crohn's does not appear to be active based on CT scan findings. Continue Flagyl per gastroenterology.  Dehydration and hyponatremia Sodium level has improved.  Continue lower rate of IV fluids. Continue to monitor closely.  VRE Urinary tract infection Urine cultures growing VRE. Discussed with Dr. Megan Salon with infectious disease. Start linezolid.   History of essential hypertension Home medications were held due to hypotension. Blood pressures are stable. Continue to monitor.   History of stage III decubitus ulcer Poor functional status at baseline. Wound care nurse has been consulted.  History of DVT Status post IVC filter placement on 08/30/2014  History of coronary artery disease status post coronary artery bypass grafting She does not appear to have acute cardiopulmonary issues. Continue statin. Holding beta blocker due to presence of hypotension.  Severe Malnutrition in context of acute illness/injury Currently on tube feedings.  Goals of care Patient has had a significant decline over the past 6 months, having Crohn's disease with multiple complications. Her functional status is poor and she currently resides at a skilled nursing facility. She recently underwent placement of PEG tube as she has had issues with dehydration, requiring recent hospitalization. She also appears to be in some discomfort. Fentanyl patch was initiated yesterday. Palliative medicine is following. Family appears to be in some denial regarding patient's condition.  DVT Prophylaxis: No Lovenox due to perianal fistula bleeding. No SCDs due to history of DVT. TED stockings.    Code Status: Full code for now  Family Communication: No family at bedside. Disposition Plan: Goals for this hospitalization appears to be better pain control and improvement in mental status. Palliative medicine to continue discussion with family as prognosis does appear to be quite poor and patient will benefit from hospice.    LOS: 5 days   [place] [facility] Pager [phone] 04/15/2015, 8:05 AM  If 7PM-7AM, please contact night-coverage at [email or web address], password  [facility]

## 2015-04-15 NOTE — Progress Notes (Addendum)
Daily Progress Note   Patient Name: [name]       Date: 04/15/2015 DOB: [birth year]  Age: 75 y.o. MRN#: [id number] Attending Physician: Bonnielee Haff, MD Primary Care Physician: Kandice Hams, MD Admit Date: 04/09/2015  Reason for Consultation/Follow-up: Establishing goals of care and Pain control  Subjective: Continues to have difficult to control pain. Met with Ramona as well as her brother and SIL. They are aware she is not doing well and may not survive. They are indecisive about code status and goals of care in general- ramona tells me her mother would not want to suffer but when I encouraged actual transition to comfort she tells me she will contact me in a few days.  Patients brother tells me he is very concerned about the decisions that are being made and feels his sister is suffering. They tell me it has almost been a year since [place] was healthy or doing well.   Length of Stay: 5 days  Current Medications: Scheduled Meds:  . ciprofloxacin  400 mg Intravenous BID  . feeding supplement (ENSURE ENLIVE)  237 mL Oral BID BM  . [START ON 04/16/2015] fentaNYL  25 mcg Transdermal Q72H  . free water  100 mL Per Tube QID  . lamoTRIgine  100 mg Oral Daily  . mesalamine  4.8 g Oral Daily  . metoprolol tartrate  25 mg Oral BID  . metronidazole  500 mg Intravenous 3 times per day  . pantoprazole  40 mg Oral Q0600  . sertraline  50 mg Oral Daily    Continuous Infusions: . sodium chloride 30 mL/hr at 04/15/15 0559  . feeding supplement (VITAL 1.5 CAL) 1,000 mL (04/14/15 2142)    PRN Meds: acetaminophen **OR** acetaminophen, ALPRAZolam, HYDROmorphone (DILAUDID) injection, ondansetron **OR** ondansetron (ZOFRAN) IV  Physical Exam: Physical Exam  Constitutional: She appears  well-developed.  + Temporal muscle wasting  HENT:  Head: Normocephalic.  Cardiovascular: Normal rate.   Pulmonary/Chest: Effort normal. No accessory muscle usage. No tachypnea. No respiratory distress. She has decreased breath sounds.  Shallow breathes  Abdominal:  PEG, colostomy  Neurological: She is alert. She is disoriented.  Psychiatric:  + Moaning constantly                Vital Signs: BP 114/68 mmHg  Pulse 97  Temp(Src)  98.1 F (36.7 C) (Axillary)  Resp 20  Wt   SpO2 97% SpO2: SpO2: 97 % O2 Device: O2 Device: Not Delivered O2 Flow Rate:    Intake/output summary:   Intake/Output Summary (Last 24 hours) at 04/15/15 0751 Last data filed at 04/15/15 0600  Gross per 24 hour  Intake   1495 ml  Output   1075 ml  Net    420 ml   LBM: Last BM Date: 04/14/15 Baseline Weight: Weight:  (bed do not have scale) Most recent weight: Weight:  (bed do not have scale)       Palliative Assessment/Data: Flowsheet Rows        Most Recent Value   Intake Tab    Referral Department  Hospitalist   Unit at Time of Referral  Med/Surg Unit   Palliative Care Primary Diagnosis  Other (Comment) [GI]   Date Notified  04/12/15   Palliative Care Type  New Palliative care   Reason for referral  Clarify Goals of Care   Date of Admission  04/06/2015   Date first seen by Palliative Care  04/12/15   # of days Palliative referral response time  0 Day(s)   # of days IP prior to Palliative referral  2   Clinical Assessment    Psychosocial & Spiritual Assessment    Palliative Care Outcomes       Additional Data Reviewed: CBC    Component Value Date/Time   WBC 7.2 04/14/2015 0549   RBC 3.97 04/14/2015 0549   HGB 10.7* 04/14/2015 0549   HCT 33.7* 04/14/2015 0549   PLT 184 04/14/2015 0549   MCV 84.9 04/14/2015 0549   MCH 27.0 04/14/2015 0549   MCHC 31.8 04/14/2015 0549   RDW 19.0* 04/14/2015 0549   LYMPHSABS 0.9 03/22/2015 1208   MONOABS 0.5 04/09/2015 1208   EOSABS 0.0 04/01/2015 1208    BASOSABS 0.0 04/08/2015 1208    CMP     Component Value Date/Time   NA 132* 04/14/2015 0549   K 3.7 04/14/2015 0549   CL 108 04/14/2015 0549   CO2 18* 04/14/2015 0549   GLUCOSE 178* 04/14/2015 0549   BUN 8 04/14/2015 0549   CREATININE 0.46 04/14/2015 0549   CREATININE 1.08 03/28/2014 1022   CALCIUM 8.2* 04/14/2015 0549   PROT 7.4 04/13/2015 1208   ALBUMIN 2.7* 04/08/2015 1208   AST 39 04/17/2015 1208   ALT 16 04/11/2015 1208   ALKPHOS 105 04/07/2015 1208   BILITOT 0.3 04/17/2015 1208   GFRNONAA >60 04/14/2015 0549   GFRNONAA 50* 03/28/2014 1022   GFRAA >60 04/14/2015 0549   GFRAA 58* 03/28/2014 1022       Problem List:  Patient Active Problem List   Diagnosis Date Noted  . Severe malnutrition ([place]) 04/13/2015  . Pain, abdominal, generalized   . Palliative care encounter 04/12/2015  . FTT (failure to thrive) in adult 03/26/2015  . Dehydration 04/12/2015  . Hypomagnesemia 03/27/2015  . Perirectal abscess   . Protein-calorie malnutrition, severe ([place]) 10/12/2014  . Depression 10/12/2014  . Pancolitis ([place])   . UTI (lower urinary tract infection) 10/10/2014  . Pressure ulcer 10/05/2014  . Inflammatory bowel disease   . Malnutrition of moderate degree ([place]) 09/30/2014  . Rectal bleeding 09/29/2014  . GI bleed 09/29/2014  . Elevated lactic acid level   . Rectal fistula   . Fasciitis   . Cold   . DVT (deep venous thrombosis) ([place])   . Acute ulcerative colitis with rectal bleeding ([place])   .  Abscess, perirectal s/p I&D 08/23/2014 08/27/2014  . Sepsis ([place]) 08/26/2014  . Infection due to yeast 08/26/2014  . Acute blood loss anemia 08/26/2014  . Leukocytosis 08/26/2014  . Hypokalemia 08/26/2014  . Hyponatremia 08/26/2014  . General weakness 08/26/2014  . Thrombocytopenia ([place]) 08/26/2014  . Essential hypertension 07/11/2008  . Inflammatory bowel disease (IBD) with colitis 07/11/2008     Palliative Care Assessment & Plan    1.Code Status:  Full code      Code Status Orders        Start     Ordered   03/26/2015 1619  Full code   Continuous     03/25/2015 1618    Code Status History    Date Active Date Inactive Code Status Order ID Comments User Context   04/09/2015  3:22 PM 04/08/2015  3:24 AM Full Code [id number]  Corrie Mckusick, DO HOV   03/26/2015  9:07 PM 03/30/2015  5:22 PM Full Code [id number]  Reubin Milan, MD Inpatient   10/10/2014  2:35 PM 10/17/2014  9:42 PM Full Code [id number]  Kelvin Cellar, MD Inpatient   09/29/2014 11:40 AM 10/07/2014  6:07 PM Full Code [id number]  Melton Alar, PA-C Inpatient   08/23/2014  1:45 AM 09/21/2014  8:59 PM Full Code [id number]  Lavina Hamman, MD Inpatient   08/07/2014  4:06 PM 08/21/2014  2:15 PM Full Code [id number]  Domenic Polite, MD Inpatient       2. Goals of Care/Additional Recommendations:  Continue aggressive care per family request. They also want her pain better managed.   Limitations on Scope of Treatment: Full Scope Treatment  Desire for further Chaplaincy support:yes  Psycho-social Needs: Caregiving  Support/Resources, Education on Hospice and Grief/Bereavement Support  3. Symptom Management:  Increased Fentanyl Patch to 44mg  Use IV dilaudid for breakthrough pain  Increased Lamictal  I CUT TUBE FEEDING BACK TO 15cc HOUR I REVIEWED RECORD AND PO INTAKE EVEN AS FAR BACK AS 10/2014 CAUSED HER PAIN- this may be underlying cuse of increased severity of symptoms.  Increased PRN Alprazolam dose  Could consider trial of steroids-may be inflammatory pain      4. Palliative Prophylaxis:   Delirium Protocol, Frequent Pain Assessment, Oral Care and Turn Reposition  5. Prognosis: Prognosis extremely poor with limited options for improvement and failure to thrive. Very likely days- weeks doubt months.   6. Discharge Planning:  To be determined.    Care plan was discussed with RN and family.  Thank you for allowing the Palliative Medicine Team to assist in the care of this  patient.  Time: 12:30PM-1:30PM Total time: 60 minutes      EAcquanetta Chain DO  04/15/2015, 7:51 AM  Please contact Palliative Medicine Team phone at [phone] questions and concerns.

## 2015-04-16 DIAGNOSIS — G9340 Encephalopathy, unspecified: Secondary | ICD-10-CM

## 2015-04-16 LAB — BASIC METABOLIC PANEL
ANION GAP: 6 (ref 5–15)
BUN: 8 mg/dL (ref 6–20)
CALCIUM: 8.1 mg/dL — AB (ref 8.9–10.3)
CO2: 22 mmol/L (ref 22–32)
Chloride: 106 mmol/L (ref 101–111)
Creatinine, Ser: 0.42 mg/dL — ABNORMAL LOW (ref 0.44–1.00)
GFR calc Af Amer: >60 mL/min (ref >60)
GFR calc non Af Amer: >60 mL/min (ref >60)
GLUCOSE: 102 mg/dL — AB (ref 65–99)
Potassium: 3.9 mmol/L (ref 3.5–5.1)
Sodium: 134 mmol/L — ABNORMAL LOW (ref 135–145)

## 2015-04-16 LAB — GLUCOSE, CAPILLARY
GLUCOSE-CAPILLARY: 74 mg/dL (ref 65–99)
GLUCOSE-CAPILLARY: 85 mg/dL (ref 65–99)
Glucose-Capillary: 84 mg/dL (ref 65–99)
Glucose-Capillary: 86 mg/dL (ref 65–99)
Glucose-Capillary: 88 mg/dL (ref 65–99)

## 2015-04-16 LAB — CBC
HEMATOCRIT: 33.1 % — AB (ref 36.0–46.0)
Hemoglobin: 10.3 g/dL — ABNORMAL LOW (ref 12.0–15.0)
MCH: 26.6 pg (ref 26.0–34.0)
MCHC: 31.1 g/dL (ref 30.0–36.0)
MCV: 85.5 fL (ref 78.0–100.0)
PLATELETS: 180 10*3/uL (ref 150–400)
RBC: 3.87 MIL/uL (ref 3.87–5.11)
RDW: 19.6 % — AB (ref 11.5–15.5)
WBC: 7 10*3/uL (ref 4.0–10.5)

## 2015-04-16 NOTE — Consult Note (Signed)
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  Silicone border foam to pressure injury to left buttocks. Change every 3 days and PRN soilage.  Cleanse coccyx wound with NS and pat gently dry Apply calcium alginate to wound bed. Cover with foam dressing.  WOC ostomy consult note Stoma type/location: RLQ, end ileostomy Stomal assessment/size: visualized through pouch, pink, moist, aprox 1 3/8"  Peristomal assessment: pouch intact Treatment options for stomal/peristomal skin: NA Output liquid, brown effluent Ostomy pouching: 1pc Will not follow at this time.  Please re-consult if needed.  Domenic Moras RN BSN [place] Pager [phone]

## 2015-04-16 NOTE — Progress Notes (Signed)
   04/16/15 1500  Clinical Encounter Type  Visited With Patient;Patient and family together  Visit Type Follow-up;Spiritual support  Referral From [place]  [facility] visited with pt who was conscious but mostly moaning; pt did acknowledge [place] in room; spoke with son offering support and encouragement in addition to prayer.  Additional follow-up with family on Wednesday if pt still at [facility]. 3:57 PM Gwynn Burly

## 2015-04-16 NOTE — Progress Notes (Signed)
Nutrition Follow-up  DOCUMENTATION CODES:   Severe malnutrition in context of acute illness/injury  INTERVENTION:  - Continue Vital 1.5 @ 15 mL/hr per Palliative Care order. This is providing 540 kcal (28% minimum estimated kcal needs), 24 grams of protein (27% minimum estimated protein needs), and 275 mL free water. - Continue 100 mL free water QID - RD will continue to monitor for needs  NUTRITION DIAGNOSIS:   Inadequate oral intake related to chronic illness as evidenced by per patient/family report. -ongoing  GOAL:   Patient will meet greater than or equal to 90% of their needs -unmet with TF rate reduced  MONITOR:   PO intake, TF tolerance, Weight trends, Labs, Skin, I & O's  ASSESSMENT:   75 y.o. 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 She has had progressive failure to thrive over the past 6 months associate with significant weight loss and minimal by mouth intake. She underwent PEG tube placement on 04/09/2015 and plan to initiate tube feeds and maximize nutritional status. She was transferred to the emergency department from her SNF for ongoing generalized weakness, failure to thrive, minimal by mouth intake, state functional decline.   2/27 Continues with no new weight since 04/09/15. Pt being followed by Palliative Care. Note from Palliative team on 04/15/15 at 1251:  I CUT TUBE FEEDING BACK TO 15cc HOUR I REVIEWED RECORD AND PO INTAKE EVEN AS FAR BACK AS 10/2014 CAUSED HER PAIN- this may be underlying cuse of  increased severity of symptoms.  Palliative note from today states daughter has requested follow-up meeting for tomorrow (2/28) related to [place]. No PO intakes since placement of PEG. Pt is not meeting needs. Will continue to monitor POC/GOC. Medications reviewed. Labs reviewed; creatinine low, Ca: 8.1 mg/dL.    2/24 - No new weight since 04/09/15. - Pt with PEG and is currently receiving Vital 1.5 @ 55 mL/hr which is her goal rate. - Unable to obtain information from pt but family member at bedside states that pt has been having ongoing pain, denies nausea.  - Pt meeting needs with TF alone.  - Pt not consuming PO nutrition at this time despite diet order.  - Spoke with RN who reports that free water flush has been given manually; she plans to program it into [place] pump related to efficiency.   2/22 - Pt seen for TF initiation and management consult.  - Pt on Regular diet with no intakes documented.  - She had PEG placed 04/09/15.  - Pt resting with eye closed at time of RD visit. Family, at bedside, provide all information. - For 1 month PTA pt had very minimal intakes related to poor appetite and discomfort due to IBS.  - Family unsure of any N/V or diarrhea that were associated with intakes PTA.  - Family very concerned about nutritional status and have many questions about TF which were discussed with them.  - Family states that pt has lost 45 lbs in the past 1 month. - Notes indicate pt was prescribed Megace PTA but this did not assist.  - Chart review indicates recorded weights may not be entirely accurate as  pt reportedly had fluctuations from 112-217 lbs since 10/19/14.  - Current weight of 169 lbs represent a 34 lb weight gain since 03/14/15, a 2 lb weight loss (1.1% body weight) from 03/09/15 which is not significant, and a 29 lb weight loss (15% body weight) from 01/02/15 which is significant for time frame.  - Unable to do physical assessment at this time per family request.   - Physical assessment was done by another RD 03/28/15 and found mild/moderate muscle and mild/moderate fat wasting to upper and lower body in addition to severe muscle wasting to hand and calf region.    Diet Order:  Diet regular Room service appropriate?: Yes; Fluid consistency:: Thin  Skin:   Stage 2 pressure ulcers to sacrum, L buttocks x2, R buttocks; Unstageable pressure ulcer to sacrum  Last BM:  2/27  Height:   Ht Readings from Last 1 Encounters:  04/02/2015 5' 8.5" (1.74 m)    Weight:   Wt Readings from Last 1 Encounters:  04/09/15 169 lb (76.658 kg)    Ideal Body Weight:  64.77 kg (kg)  BMI:  There is no weight on file to calculate BMI.  Estimated Nutritional Needs:   Kcal:  1900-2100 (25-28 kcal/kg)  Protein:  90-100 grams (1.2-1.3 grams/kg)  Fluid:  >/= 2.1 L/day  EDUCATION NEEDS:   No education needs identified at this time     Jarome Matin, RD, LDN Inpatient Clinical Dietitian Pager # [phone] After hours/weekend pager # [phone]

## 2015-04-16 NOTE — Progress Notes (Signed)
Palliative:   I have spoken with daughter, Donnald Garre, and requested follow up meeting with family tomorrow 2/28. Will await call back to further address [place].   Vinie Sill, NP Palliative Medicine Team Pager # [phone] (M-F 8a-5p) Team Phone # [phone] (Nights/Weekends)

## 2015-04-16 NOTE — Progress Notes (Signed)
[facility] PROGRESS NOTE  [name] [id number] DOB: 12-18-1940 DOA: 04/14/2015  PCP: Kandice Hams, MD  Brief HPI: 75 year old African-American female with multiple comorbidities including Crohn's colitis complicated by perirectal fistula with the progressively worsening failure to thrive, weight loss, who was hospitalized recently for dehydration and hyponatremia. She presents again with the difficulty with taking anything orally and failure to thrive and generalized weakness. She was found to have hyponatremia. She was hospitalized for further management. Palliative medicine and gastroenterology was consulted.  Past medical history:  Past Medical History  Diagnosis Date  . CAD (coronary artery disease)     unspecified  . Cerebrovascular disease 2010    bil carotid artery disease. left CEA 02/2004. 02/2014 carotid ultrasound: < 40% bil ICA disease.   Marland Kitchen Hyperlipidemia, mixed   . HTN (hypertension)   . Ulcerative colitis 2002  . Colon polyps 2006, 2014  . Thyroid disease   . Myocardial infarct ([place]) 1998  . Diabetes mellitus without complication (HCC)     Borderline  . GERD (gastroesophageal reflux disease)   . Arthritis   . 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   . Depression 10/12/2014  . DVT (deep venous thrombosis) ([place]) 08/29/14    left LE, s/p 08/30/14 IVC filter.     Consultants: Gastroenterology, palliative medicine  Procedures: Underwent PEG tube placement on 2/20 as outpatient  Antibiotics: Placed on ceftriaxone at the time of admission. Changed to ciprofloxacin and Flagyl by gastroenterology 2/22 Ciprofloxacin stopped on 2/26 Linezolid initiated 2/26  Subjective: Patient apparently was crying inconsolably overnight. Most likely secondary to pain. She remains lethargic and somewhat distracted. continues to be lethargic.    Objective: Vital Signs  Filed Vitals:   04/15/15 1538 04/15/15 2207 04/16/15 0547 04/16/15 0554  BP: 108/56 100/59  89/38 92/61  Pulse: 86 97 81   Temp: 97.8 F (36.6 C) 97.7 F (36.5 C)    TempSrc: Oral Axillary    Resp: 20 20    SpO2: 98% 99% 95%     Intake/Output Summary (Last 24 hours) at 04/16/15 0809 Last data filed at 04/16/15 0701  Gross per 24 hour  Intake   1320 ml  Output   1525 ml  Net   -205 ml   Filed Weights    General appearance: Lethargic and confused Resp: Diminished air entry at the bases without any crackles or wheezing. Cardio: regular rate and rhythm, S1, S2 normal, no murmur, click, rub or gallop GI: Abdomen is soft. Mildly tender in the lower quadrants without any rebound, rigidity or guarding. Extremities: Extremities not cold to touch. Good pulses. Neurologic: Lethargic. Arousable. Moving her extremities. Squeezes my fingers.  Lab Results:  Basic Metabolic Panel:  Recent Labs Lab 04/11/15 0538 04/12/15 0533 04/13/15 0551 04/14/15 0549 04/16/15 0541  NA 128* 131* 131* 132* 134*  K 4.2 3.5 3.3* 3.7 3.9  CL 103 108 108 108 106  CO2 17* 15* 15* 18* 22  GLUCOSE 87 97 196* 178* 102*  BUN 15 11 10 8 8   CREATININE 0.57 0.57 0.56 0.46 0.42*  CALCIUM 9.2 8.9 8.2* 8.2* 8.1*   Liver Function Tests:  Recent Labs Lab 03/31/2015 1208  AST 39  ALT 16  ALKPHOS 105  BILITOT 0.3  PROT 7.4  ALBUMIN 2.7*    Recent Labs Lab 04/07/2015 1208  LIPASE 29   CBC:  Recent Labs Lab 04/06/2015 1208  04/11/15 1240 04/12/15 0533 04/13/15 0551 04/14/15 0549 04/16/15 0541  WBC 12.2*  < >  10.0 10.1 4.8 7.2 7.0  NEUTROABS 10.8*  --   --   --   --   --   --   HGB 12.4  < > 11.3* 11.7* 11.0* 10.7* 10.3*  HCT 39.4  < > 35.0* 36.7 34.9* 33.7* 33.1*  MCV 83.5  < > 82.9 85.5 84.7 84.9 85.5  PLT 210  < > 223 210 181 184 180  < > = values in this interval not displayed.  Recent Results (from the past 240 hour(s))  Urine culture     Status: None   Collection Time: 04/11/2015 12:38 PM  Result Value Ref Range Status   Specimen Description URINE, CATHETERIZED  Final    Special Requests NONE  Final   Culture   Final    >=100,000 COLONIES/mL VANCOMYCIN RESISTANT ENTEROCOCCUS ISOLATED Performed at [facility]    Report Status 04/14/2015 FINAL  Final   Organism ID, Bacteria VANCOMYCIN RESISTANT ENTEROCOCCUS ISOLATED  Final      Susceptibility   Vancomycin resistant enterococcus isolated - MIC*    AMPICILLIN >=32 RESISTANT Resistant     LEVOFLOXACIN >=8 RESISTANT Resistant     NITROFURANTOIN 256 RESISTANT Resistant     VANCOMYCIN >=32 RESISTANT Resistant     LINEZOLID 2 SENSITIVE Sensitive     * >=100,000 COLONIES/mL VANCOMYCIN RESISTANT ENTEROCOCCUS ISOLATED  MRSA PCR Screening     Status: None   Collection Time: 03/25/2015 11:45 PM  Result Value Ref Range Status   MRSA by PCR NEGATIVE NEGATIVE Final    Comment:        The GeneXpert MRSA Assay (FDA approved for NASAL specimens only), is one component of a comprehensive MRSA colonization surveillance program. It is not intended to diagnose MRSA infection nor to guide or monitor treatment for MRSA infections.   MRSA PCR Screening     Status: None   Collection Time: 04/14/15  8:44 AM  Result Value Ref Range Status   MRSA by PCR NEGATIVE NEGATIVE Final    Comment:        The GeneXpert MRSA Assay (FDA approved for NASAL specimens only), is one component of a comprehensive MRSA colonization surveillance program. It is not intended to diagnose MRSA infection nor to guide or monitor treatment for MRSA infections.       Studies/Results: No results found.  Medications:  Scheduled: . feeding supplement (ENSURE ENLIVE)  237 mL Oral BID BM  . fentaNYL  25 mcg Transdermal Q72H  . free water  100 mL Per Tube QID  . lamoTRIgine  100 mg Oral Daily  . linezolid  600 mg Per Tube Q12H  . mesalamine  4.8 g Oral Daily  . metoprolol tartrate  25 mg Oral BID  . metronidazole  500 mg Intravenous 3 times per day  . pantoprazole  40 mg Oral Q0600  . sertraline  50 mg Oral Daily    Continuous: . sodium chloride 30 mL/hr at 04/15/15 0559  . feeding supplement (VITAL 1.5 CAL) 1,000 mL (04/14/15 2142)   [id number] **OR** acetaminophen, ALPRAZolam, HYDROmorphone (DILAUDID) injection, ondansetron **OR** ondansetron (ZOFRAN) IV  Assessment/Plan:  Principal Problem:   FTT (failure to thrive) in adult Active Problems:   Essential hypertension   Hyponatremia   Rectal fistula   Inflammatory bowel disease   UTI (lower urinary tract infection)   Dehydration   Palliative care encounter   Severe malnutrition (HCC)   Pain, abdominal, generalized   Decubitus ulcer, stage 3 (HCC)     Acute Encephalopathy  Patient's lethargy and confusion is multifactorial including acute illness. Malnutrition. Infection pain. Seems to be slightly better today compared to yesterday. But still not engaging in conversation. No neurological deficits appreciated. Hoping that there could be an improvement in her confusion now that she is on an appropriate treatment of her UTI, which was initiated yesterday.  Failure to thrive Most likely secondary to her Crohn's disease and poor oral intake and UTI. PEG tube was placed by radiology on 2/20. Was started on tube feedings 2/22. Continue IV fluids at low rate. Patient has not been making any progress. This is most likely due to her complicated underlying disease processes. Prognosis appears to be poor.  Complicated Crohn's disease with bleeding from perianal fistula She was diagnosed with perirectal abscess in July 2016 that required surgical intervention and diverging ileostomy. 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 MRI of pelvis performed on 02/14/2015 show a right lateral transsphincteric perianal fistula coursing superiorly and inferiorly into the gluteal crease. No abscess identified at the time. During this time she has been treated with biologic agents  initially with Remicade. However, she seems to have developed antibodies to this medication. GI was considering other agents. However, considering her poor general health she may not be a candidate for same. Patient continues to have on and off bleeding from her fistula. Gastroenterology is following. Crohn's does not appear to be active based on CT scan findings. Continue Flagyl per gastroenterology. Consider discontinuing after a total of 7 days.  Dehydration and hyponatremia Sodium level has improved. Continue lower rate of IV fluids. Continue to monitor closely.  VRE Urinary tract infection Urine cultures growing VRE. Discussed with Dr. Megan Salon with infectious disease. Started on linezolid.   History of essential hypertension Home medications were held due to hypotension. Blood pressures are stable. Continue to monitor.   History of stage III decubitus ulcer Poor functional status at baseline. Wound care nurse has been consulted.  History of DVT Status post IVC filter placement on 08/30/2014  History of coronary artery disease status post coronary artery bypass grafting She does not appear to have acute cardiopulmonary issues. Continue statin. Holding beta blocker due to presence of hypotension.  Severe Malnutrition in context of acute illness/injury Currently on tube feedings. Rate of 2 feeding was decreased. The palliative medicine as they felt that it could be causing abdominal pain.  Goals of care Patient has had a significant decline over the past 6 months, having Crohn's disease with multiple complications. Her functional status is poor and she currently resides at a skilled nursing facility. She recently underwent placement of PEG tube as she has had issues with dehydration, requiring recent hospitalization. She also appears to be in some discomfort. Fentanyl patch was initiated 2/24. Palliative medicine is following. We appreciate their assistance. Family appears to be in some  denial regarding patient's condition.  DVT Prophylaxis: No Lovenox due to perianal fistula bleeding. No SCDs due to history of DVT. TED stockings.    Code Status: Full code for now  Family Communication: No family at bedside. Disposition Plan: Goals for this hospitalization appears to be better pain control and improvement in mental status. Palliative medicine to continue discussion with family as prognosis does appear to be quite poor and patient will benefit from hospice.    LOS: 6 days   [facility] [facility] Pager [phone] 04/16/2015, 8:09 AM  If  7PM-7AM, please contact night-coverage at [email or web address], password [email or web address]

## 2015-04-16 NOTE — Progress Notes (Signed)
Pt is crying inconsolably.  Dilaudid was given, pt was repositioned, trying to comfort pt to no avail. Fara Olden P

## 2015-04-17 LAB — GLUCOSE, CAPILLARY
GLUCOSE-CAPILLARY: 81 mg/dL (ref 65–99)
GLUCOSE-CAPILLARY: 84 mg/dL (ref 65–99)
Glucose-Capillary: 84 mg/dL (ref 65–99)
Glucose-Capillary: 99 mg/dL (ref 65–99)

## 2015-04-17 MED ORDER — SCOPOLAMINE 1 MG/3DAYS TD PT72
1.0000 | MEDICATED_PATCH | TRANSDERMAL | Status: DC
  Administered 2015-04-17 – 2015-04-20 (×2): 1.5 mg via TRANSDERMAL
  Filled 2015-04-17 (×3): qty 1

## 2015-04-17 MED ORDER — HYDROMORPHONE HCL 1 MG/ML IJ SOLN
1.0000 mg | INTRAMUSCULAR | Status: DC | PRN
  Administered 2015-04-17: 1 mg via INTRAVENOUS
  Administered 2015-04-17 – 2015-04-18 (×5): 2 mg via INTRAVENOUS
  Administered 2015-04-18: 1 mg via INTRAVENOUS
  Administered 2015-04-18 (×3): 2 mg via INTRAVENOUS
  Administered 2015-04-19: 1 mg via INTRAVENOUS
  Administered 2015-04-19 (×4): 2 mg via INTRAVENOUS
  Administered 2015-04-19: 1 mg via INTRAVENOUS
  Administered 2015-04-20 – 2015-04-21 (×9): 2 mg via INTRAVENOUS
  Administered 2015-04-21: 1 mg via INTRAVENOUS
  Administered 2015-04-22: 2 mg via INTRAVENOUS
  Administered 2015-04-22: 1 mg via INTRAVENOUS
  Administered 2015-04-22 (×4): 2 mg via INTRAVENOUS
  Filled 2015-04-17: qty 2
  Filled 2015-04-17: qty 1
  Filled 2015-04-17 (×2): qty 2
  Filled 2015-04-17: qty 1
  Filled 2015-04-17 (×4): qty 2
  Filled 2015-04-17: qty 1
  Filled 2015-04-17 (×3): qty 2
  Filled 2015-04-17: qty 1
  Filled 2015-04-17 (×7): qty 2
  Filled 2015-04-17: qty 1
  Filled 2015-04-17: qty 2
  Filled 2015-04-17: qty 1
  Filled 2015-04-17 (×9): qty 2

## 2015-04-17 MED ORDER — SERTRALINE HCL 20 MG/ML PO CONC: 50.0000 mg | Freq: Every day | ORAL | Status: DC

## 2015-04-17 MED ORDER — BIOTENE DRY MOUTH MT LIQD: 15.0000 mL | OROMUCOSAL | Status: DC | PRN

## 2015-04-17 MED ORDER — GLYCOPYRROLATE 0.2 MG/ML IJ SOLN
0.2000 mg | INTRAMUSCULAR | Status: DC
  Administered 2015-04-17 – 2015-04-18 (×6): 0.2 mg via INTRAVENOUS
  Filled 2015-04-17 (×11): qty 1

## 2015-04-17 MED ORDER — HYDROMORPHONE HCL 1 MG/ML IJ SOLN: 0.5000 mg | INTRAMUSCULAR | Status: DC | PRN

## 2015-04-17 MED ORDER — POLYVINYL ALCOHOL 1.4 % OP SOLN
1.0000 [drp] | Freq: Four times a day (QID) | OPHTHALMIC | Status: DC | PRN
Start: 2015-04-17 — End: 2015-04-23
  Filled 2015-04-17: qty 15

## 2015-04-17 MED ORDER — LORAZEPAM 2 MG/ML IJ SOLN
0.5000 mg | INTRAMUSCULAR | Status: DC | PRN
Start: 2015-04-17 — End: 2015-04-21
  Administered 2015-04-18 – 2015-04-21 (×2): 0.5 mg via INTRAVENOUS
  Filled 2015-04-17 (×2): qty 1

## 2015-04-17 MED ORDER — ALPRAZOLAM 1 MG PO TABS
1.0000 mg | ORAL_TABLET | Freq: Two times a day (BID) | ORAL | Status: DC
  Administered 2015-04-18 – 2015-04-20 (×5): 1 mg via ORAL
  Filled 2015-04-17 (×6): qty 1

## 2015-04-17 MED ORDER — PANTOPRAZOLE SODIUM 40 MG PO PACK
40.0000 mg | PACK | Freq: Every day | ORAL | Status: DC
  Administered 2015-04-17 – 2015-04-20 (×4): 40 mg
  Filled 2015-04-17 (×7): qty 20

## 2015-04-17 NOTE — Care Management Important Message (Signed)
Important Message  Patient Details IM Letter given to Cookie/Case Manager to present to Patient Name: [name] MRN: [id number] Date of Birth: [birth year]   Medicare Important Message Given:  Yes    Camillo Flaming 04/17/2015, 1:31 PMImportant Message  Patient Details  Name: [name] MRN: [id number] Date of Birth: [birth year]   Medicare Important Message Given:  Yes    Camillo Flaming 04/17/2015, 1:31 PM

## 2015-04-17 NOTE — Progress Notes (Signed)
CSW received consult and will speak with Pt's daughter after the [place] meeting for placement.   CSW will continue to follow for d/c planning.   Pete Pelt [facility]  [phone]

## 2015-04-17 NOTE — Progress Notes (Signed)
[facility] PROGRESS NOTE  [name] [id number] DOB: 1940/03/23 DOA: 04/14/2015  PCP: Kandice Hams, MD  Brief HPI: 75 year old African-American female with multiple comorbidities including Crohn's colitis complicated by perirectal fistula with the progressively worsening failure to thrive, weight loss, who was hospitalized recently for dehydration and hyponatremia. She presents again with the difficulty with taking anything orally and failure to thrive and generalized weakness. She was found to have hyponatremia. She was hospitalized for further management. Palliative medicine and gastroenterology was consulted. Family has had different expectations and expects recovery. They were surprised by the poor prognosis outlined by the medical team. Patient continues to be encephalopathic and in significant pain.  Past medical history:  Past Medical History  Diagnosis Date  . CAD (coronary artery disease)     unspecified  . Cerebrovascular disease 2010    bil carotid artery disease. left CEA 02/2004. 02/2014 carotid ultrasound: < 40% bil ICA disease.   Marland Kitchen Hyperlipidemia, mixed   . HTN (hypertension)   . Ulcerative colitis 2002  . Colon polyps 2006, 2014  . Thyroid disease   . Myocardial infarct ([place]) 1998  . Diabetes mellitus without complication (HCC)     Borderline  . GERD (gastroesophageal reflux disease)   . Arthritis   . 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   . Depression 10/12/2014  . DVT (deep venous thrombosis) ([place]) 08/29/14    left LE, s/p 08/30/14 IVC filter.     Consultants: Gastroenterology, palliative medicine  Procedures: Underwent PEG tube placement on 2/20 as outpatient  Antibiotics: Placed on ceftriaxone at the time of admission. Changed to ciprofloxacin and Flagyl by gastroenterology 2/22 Ciprofloxacin stopped on 2/26 Linezolid initiated 2/26  Subjective: Patient is moaning and groaning this morning. She remains lethargic and not very  communicative.   Objective: Vital Signs  Filed Vitals:   04/16/15 1114 04/16/15 1517 04/16/15 2042 04/17/15 0508  BP: 106/58 112/59 99/61 109/68  Pulse: 102 99 93 101  Temp: 97.8 F (36.6 C) 97.8 F (36.6 C) 98.2 F (36.8 C) 97.7 F (36.5 C)  TempSrc: Axillary Axillary Axillary Axillary  Resp: 20 20 20 20   SpO2: 97% 99% 99% 97%    Intake/Output Summary (Last 24 hours) at 04/17/15 0805 Last data filed at 04/17/15 0500  Gross per 24 hour  Intake 2696.33 ml  Output   1275 ml  Net 1421.33 ml   Filed Weights    General appearance: Lethargic and confused Resp: Diminished air entry at the bases without any crackles or wheezing. Cardio: regular rate and rhythm, S1, S2 normal, no murmur, click, rub or gallop GI: Abdomen is soft. Mildly tender in the lower quadrants without any rebound, rigidity or guarding. Extremities: Extremities not cold to touch. Good pulses. Neurologic: Lethargic. Arousable. Moving her extremities. Squeezes my fingers.  Lab Results:  Basic Metabolic Panel:  Recent Labs Lab 04/11/15 0538 04/12/15 0533 04/13/15 0551 04/14/15 0549 04/16/15 0541  NA 128* 131* 131* 132* 134*  K 4.2 3.5 3.3* 3.7 3.9  CL 103 108 108 108 106  CO2 17* 15* 15* 18* 22  GLUCOSE 87 97 196* 178* 102*  BUN 15 11 10 8 8   CREATININE 0.57 0.57 0.56 0.46 0.42*  CALCIUM 9.2 8.9 8.2* 8.2* 8.1*   Liver Function Tests:  Recent Labs Lab 04/13/2015 1208  AST 39  ALT 16  ALKPHOS 105  BILITOT 0.3  PROT 7.4  ALBUMIN 2.7*    Recent Labs Lab 03/24/2015 1208  LIPASE 29  CBC:  Recent Labs Lab 03/21/2015 1208  04/11/15 1240 04/12/15 0533 04/13/15 0551 04/14/15 0549 04/16/15 0541  WBC 12.2*  < > 10.0 10.1 4.8 7.2 7.0  NEUTROABS 10.8*  --   --   --   --   --   --   HGB 12.4  < > 11.3* 11.7* 11.0* 10.7* 10.3*  HCT 39.4  < > 35.0* 36.7 34.9* 33.7* 33.1*  MCV 83.5  < > 82.9 85.5 84.7 84.9 85.5  PLT 210  < > 223 210 181 184 180  < > = values in this interval not  displayed.  Recent Results (from the past 240 hour(s))  Urine culture     Status: None   Collection Time: 04/04/2015 12:38 PM  Result Value Ref Range Status   Specimen Description URINE, CATHETERIZED  Final   Special Requests NONE  Final   Culture   Final    >=100,000 COLONIES/mL VANCOMYCIN RESISTANT ENTEROCOCCUS ISOLATED Performed at [facility]    Report Status 04/14/2015 FINAL  Final   Organism ID, Bacteria VANCOMYCIN RESISTANT ENTEROCOCCUS ISOLATED  Final      Susceptibility   Vancomycin resistant enterococcus isolated - MIC*    AMPICILLIN >=32 RESISTANT Resistant     LEVOFLOXACIN >=8 RESISTANT Resistant     NITROFURANTOIN 256 RESISTANT Resistant     VANCOMYCIN >=32 RESISTANT Resistant     LINEZOLID 2 SENSITIVE Sensitive     * >=100,000 COLONIES/mL VANCOMYCIN RESISTANT ENTEROCOCCUS ISOLATED  MRSA PCR Screening     Status: None   Collection Time: 04/02/2015 11:45 PM  Result Value Ref Range Status   MRSA by PCR NEGATIVE NEGATIVE Final    Comment:        The GeneXpert MRSA Assay (FDA approved for NASAL specimens only), is one component of a comprehensive MRSA colonization surveillance program. It is not intended to diagnose MRSA infection nor to guide or monitor treatment for MRSA infections.   MRSA PCR Screening     Status: None   Collection Time: 04/14/15  8:44 AM  Result Value Ref Range Status   MRSA by PCR NEGATIVE NEGATIVE Final    Comment:        The GeneXpert MRSA Assay (FDA approved for NASAL specimens only), is one component of a comprehensive MRSA colonization surveillance program. It is not intended to diagnose MRSA infection nor to guide or monitor treatment for MRSA infections.       Studies/Results: No results found.  Medications:  Scheduled: . feeding supplement (ENSURE ENLIVE)  237 mL Oral BID BM  . fentaNYL  25 mcg Transdermal Q72H  . free water  100 mL Per Tube QID  . lamoTRIgine  100 mg Oral Daily  . linezolid  600 mg Per Tube  Q12H  . mesalamine  4.8 g Oral Daily  . metronidazole  500 mg Intravenous 3 times per day  . pantoprazole sodium  40 mg Per Tube Daily  . sertraline  50 mg Oral Daily   Continuous: . sodium chloride 30 mL/hr at 04/15/15 0559  . feeding supplement (VITAL 1.5 CAL) 1,000 mL (04/16/15 1202)   [id number] **OR** acetaminophen, ALPRAZolam, HYDROmorphone (DILAUDID) injection, ondansetron **OR** ondansetron (ZOFRAN) IV  Assessment/Plan:  Principal Problem:   FTT (failure to thrive) in adult Active Problems:   Essential hypertension   Hyponatremia   Rectal fistula   Inflammatory bowel disease   UTI (lower urinary tract infection)   Dehydration   Palliative care encounter   Severe malnutrition ([place])  Pain, abdominal, generalized   Decubitus ulcer, stage 3 (HCC)     Acute Encephalopathy Patient's lethargy and confusion is multifactorial including acute illness, malnutrition, infection, pain. Not engaging in conversation. No neurological deficits appreciated. Hoping that there could be an improvement in her confusion now that she is on an appropriate treatment of her UTI, which was initiated 2/26. She is now also on a fentanyl patch.  Failure to thrive Most likely secondary to her Crohn's disease and poor oral intake and UTI. PEG tube was placed by radiology on 2/20. Was started on tube feedings 2/22. Continue IV fluids at low rate. Patient has not been making any progress. This is most likely due to her complicated underlying disease processes. Prognosis appears to be poor.  Complicated Crohn's disease with bleeding from perianal fistula She was diagnosed with perirectal abscess in July 2016 that required surgical intervention and diverging ileostomy. 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 MRI of pelvis performed on 02/14/2015 show a right lateral transsphincteric perianal fistula coursing superiorly  and inferiorly into the gluteal crease. No abscess identified at the time. During this time she has been treated with biologic agents initially with Remicade. However, she seems to have developed antibodies to this medication. GI was considering other agents. However, considering her poor general health she may not be a candidate for same. Patient continues to have on and off bleeding from her fistula. Gastroenterology is following. Crohn's does not appear to be active based on CT scan findings. Continue Flagyl per gastroenterology. Consider discontinuing after a total of 7 days. Day 6 today.  Dehydration and hyponatremia Sodium level has improved. Continue lower rate of IV fluids. Continue to monitor closely.  VRE Urinary tract infection Urine cultures growing VRE. Discussed with Dr. Megan Salon with infectious disease and she was started on linezolid. Sertraline discontinued due to significant risk of serotonin syndrome when used in combination with linezolid.  History of essential hypertension Home medications were held due to hypotension. Blood pressures are stable. Continue to monitor.   History of stage III decubitus ulcer Poor functional status at baseline. Wound care nurse has been consulted.  History of DVT Status post IVC filter placement on 08/30/2014  History of coronary artery disease status post coronary artery bypass grafting She does not appear to have acute cardiopulmonary issues. Continue statin. Holding beta blocker due to presence of hypotension.  Severe Malnutrition in context of acute illness/injury Currently on tube feedings. Rate of tube feeding was decreased by palliative medicine as they felt that it could be causing abdominal pain.  Goals of care Patient has had a significant decline over the past 6 months, having Crohn's disease with multiple complications. Her functional status is poor and she currently resides at a skilled nursing facility. She recently underwent  placement of PEG tube as she has had issues with dehydration, requiring recent hospitalization. She also appears to be in some discomfort. Fentanyl patch was initiated 2/24. Palliative medicine is following. We appreciate their assistance. Family appears to be in some denial regarding patient's condition. Palliative medicine plans to meet with family again today.  DVT Prophylaxis: No Lovenox due to perianal fistula bleeding. No SCDs due to history of DVT. TED stockings.    Code Status: Full code for now  Family Communication: No family at bedside. Disposition Plan: Goals for this hospitalization appears to be better pain control and improvement in  mental status. Palliative medicine to continue discussion with family as prognosis does appear to be quite poor and patient will benefit from hospice.    LOS: 7 days   [place] [facility] Pager [phone] 04/17/2015, 8:05 AM  If 7PM-7AM, please contact night-coverage at [email or web address], password [facility]

## 2015-04-17 NOTE — Progress Notes (Signed)
Daily Progress Note   Patient Name: [name]       Date: 04/17/2015 DOB: 06-14-1940  Age: 75 y.o. MRN#: [id number] Attending Physician: Bonnielee Haff, MD Primary Care Physician: Kandice Hams, MD Admit Date: 04/14/2015  Reason for Consultation/Follow-up: Establishing goals of care  Subjective: I met today with [name], her husband Jeneen Rinks, and children at bedside. I requested to remeet with them today to reevaluate after receiving information from other providers. They tell me that they have met as a family and have decided on hospice and comfort focused care. This has been a very difficult decision for them to make but they are in agreement. I do believe this is the only realistic option. [name] is still crying and moaning. We discussed plan for comfort, stopping tube feedings, liberalizing pain medications (she made need infusion), and transition to hospice care. Emotional support provided to family.    Length of Stay: 7 days  Current Medications: Scheduled Meds:  . ALPRAZolam  1 mg Oral BID  . fentaNYL  25 mcg Transdermal Q72H  . glycopyrrolate  0.2 mg Intravenous Q4H  . mesalamine  4.8 g Oral Daily  . pantoprazole sodium  40 mg Per Tube Daily  . scopolamine  1 patch Transdermal Q72H    Continuous Infusions: . sodium chloride 30 mL/hr at 04/15/15 0559    PRN Meds: acetaminophen **OR** acetaminophen, antiseptic oral rinse, HYDROmorphone (DILAUDID) injection, LORazepam, ondansetron **OR** ondansetron (ZOFRAN) IV, polyvinyl alcohol  Physical Exam: Physical Exam  Constitutional: She appears well-developed and well-nourished. She appears lethargic.  Temporal muscle wasting  HENT:  Head: Normocephalic.  Cardiovascular: Tachycardia present.   Pulmonary/Chest: Effort  normal. Tachypnea noted.  Shallow breathes  Abdominal: There is tenderness.  + PEG, ostomy  Neurological: She appears lethargic.  + moaning, crying                Vital Signs: BP 109/68 mmHg  Pulse 101  Temp(Src) 97.7 F (36.5 C) (Axillary)  Resp 20  Wt   SpO2 97% SpO2: SpO2: 97 % O2 Device: O2 Device: Not Delivered O2 Flow Rate:    Intake/output summary:  Intake/Output Summary (Last 24 hours) at 04/17/15 1333 Last data filed at 04/17/15 1154  Gross per 24 hour  Intake 2696.33 ml  Output   1310 ml  Net  1386.33 ml   LBM: Last BM Date: 04/17/15 Baseline Weight: Weight:  (bed do not have scale) Most recent weight: Weight:  (bed do not have scale)       Palliative Assessment/Data: Flowsheet Rows        Most Recent Value   Intake Tab    Referral Department  Hospitalist   Unit at Time of Referral  Med/Surg Unit   Palliative Care Primary Diagnosis  Other (Comment) [GI]   Date Notified  04/12/15   Palliative Care Type  New Palliative care   Reason for referral  Clarify Goals of Care   Date of Admission  04/09/2015   Date first seen by Palliative Care  04/12/15   # of days Palliative referral response time  0 Day(s)   # of days IP prior to Palliative referral  2   Clinical Assessment    Psychosocial & Spiritual Assessment    Palliative Care Outcomes       Additional Data Reviewed: CBC    Component Value Date/Time   WBC 7.0 04/16/2015 0541   RBC 3.87 04/16/2015 0541   HGB 10.3* 04/16/2015 0541   HCT 33.1* 04/16/2015 0541   PLT 180 04/16/2015 0541   MCV 85.5 04/16/2015 0541   MCH 26.6 04/16/2015 0541   MCHC 31.1 04/16/2015 0541   RDW 19.6* 04/16/2015 0541   LYMPHSABS 0.9 04/04/2015 1208   MONOABS 0.5 03/26/2015 1208   EOSABS 0.0 03/31/2015 1208   BASOSABS 0.0 03/21/2015 1208    CMP     Component Value Date/Time   NA 134* 04/16/2015 0541   K 3.9 04/16/2015 0541   CL 106 04/16/2015 0541   CO2 22 04/16/2015 0541   GLUCOSE 102* 04/16/2015 0541   BUN 8  04/16/2015 0541   CREATININE 0.42* 04/16/2015 0541   CREATININE 1.08 03/28/2014 1022   CALCIUM 8.1* 04/16/2015 0541   PROT 7.4 04/16/2015 1208   ALBUMIN 2.7* 04/06/2015 1208   AST 39 04/15/2015 1208   ALT 16 04/16/2015 1208   ALKPHOS 105 04/05/2015 1208   BILITOT 0.3 04/09/2015 1208   GFRNONAA >60 04/16/2015 0541   GFRNONAA 50* 03/28/2014 1022   GFRAA >60 04/16/2015 0541   GFRAA 58* 03/28/2014 1022       Problem List:  Patient Active Problem List   Diagnosis Date Noted  . Decubitus ulcer, stage 3 ([place]) 04/15/2015  . Severe malnutrition ([place]) 04/13/2015  . Pain, abdominal, generalized   . Palliative care encounter 04/12/2015  . FTT (failure to thrive) in adult 04/06/2015  . Dehydration 04/07/2015  . Hypomagnesemia 03/27/2015  . Perirectal abscess   . Protein-calorie malnutrition, severe ([place]) 10/12/2014  . Depression 10/12/2014  . Pancolitis ([place])   . UTI (lower urinary tract infection) 10/10/2014  . Pressure ulcer 10/05/2014  . Inflammatory bowel disease   . Malnutrition of moderate degree ([place]) 09/30/2014  . Rectal bleeding 09/29/2014  . GI bleed 09/29/2014  . Elevated lactic acid level   . Rectal fistula   . Fasciitis   . Cold   . DVT (deep venous thrombosis) ([place])   . Acute ulcerative colitis with rectal bleeding ([place])   . Abscess, perirectal s/p I&D 08/23/2014 08/27/2014  . Sepsis ([place]) 08/26/2014  . Infection due to yeast 08/26/2014  . Acute blood loss anemia 08/26/2014  . Leukocytosis 08/26/2014  . Hypokalemia 08/26/2014  . Hyponatremia 08/26/2014  . General weakness 08/26/2014  . Thrombocytopenia ([place]) 08/26/2014  . Essential hypertension 07/11/2008  . Inflammatory bowel disease (IBD) with  colitis 07/11/2008     Palliative Care Assessment & Plan    1.Code Status:  DNR    Code Status Orders        Start     Ordered   04/17/15 1331  Do not attempt resuscitation (DNR)   Continuous    Question Answer Comment  In the event of cardiac or  respiratory ARREST Do not call a "code blue"   In the event of cardiac or respiratory ARREST Do not perform Intubation, CPR, defibrillation or ACLS   In the event of cardiac or respiratory ARREST Use medication by any route, position, wound care, and other measures to relive pain and suffering. May use oxygen, suction and manual treatment of airway obstruction as needed for comfort.      04/17/15 1331    Code Status History    Date Active Date Inactive Code Status Order ID Comments User Context   04/17/2015  1:13 PM 04/17/2015  1:31 PM DNR [id number]  Pershing Proud, NP Inpatient   04/17/2015  4:19 PM 04/17/2015  1:13 PM Full Code [id number]  Kelvin Cellar, MD ED   04/09/2015  3:22 PM 03/21/2015  3:24 AM Full Code [id number]  Corrie Mckusick, DO HOV   03/26/2015  9:07 PM 03/30/2015  5:22 PM Full Code [id number]  Reubin Milan, MD Inpatient   10/10/2014  2:35 PM 10/17/2014  9:42 PM Full Code [id number]  Kelvin Cellar, MD Inpatient   09/29/2014 11:40 AM 10/07/2014  6:07 PM Full Code [id number]  Melton Alar, PA-C Inpatient   08/23/2014  1:45 AM 09/21/2014  8:59 PM Full Code [id number]  Lavina Hamman, MD Inpatient   08/07/2014  4:06 PM 08/21/2014  2:15 PM Full Code [id number]  Domenic Polite, MD Inpatient       2. Goals of Care/Additional Recommendations:  Full comfort care.   Limitations on Scope of Treatment: Full Comfort Care  Desire for further Chaplaincy support:yes  Psycho-social Needs: Caregiving  Support/Resources, Education on Hospice and Grief/Bereavement Support  3. Symptom Management:      1. PAIN: Dilaudid increased 1-2 mg every 30 min prn. Continue Fent patch 25 mcg (may consider increasing tomorrow based on use of dilaudid).       2. Likely anxiety component to suffering: Xanax 1 mg scheduled BID. Ativan 0.5 mg every 4 hours prn.       3. Secretions/gurgling: Robinul 0.2 mg every 4 hours scheduled. Scopolamine patch ordered as well.   4. Palliative Prophylaxis:   Frequent Pain  Assessment, Oral Care and Turn Reposition  5. Prognosis: Days to < 2 weeks.  6. Discharge Planning:  Hospice facility   Care plan was discussed with Dr. [place] Pink.   Thank you for allowing the Palliative Medicine Team to assist in the care of this patient.   Time In: 9892-1194, 1410-1420 Total Time 36mn Prolonged Time Billed  no         APershing Proud NP  21/74/0814 1:33 PM  Please contact Palliative Medicine Team phone at [phone] questions and concerns.

## 2015-04-18 DIAGNOSIS — R109 Unspecified abdominal pain: Secondary | ICD-10-CM | POA: Insufficient documentation

## 2015-04-18 MED ORDER — GLYCOPYRROLATE 0.2 MG/ML IJ SOLN
0.4000 mg | INTRAMUSCULAR | Status: DC
  Administered 2015-04-18 – 2015-04-23 (×28): 0.4 mg via INTRAVENOUS
  Filled 2015-04-18 (×35): qty 2

## 2015-04-18 MED ORDER — ATROPINE SULFATE 1 % OP SOLN
2.0000 [drp] | OPHTHALMIC | Status: DC | PRN
  Administered 2015-04-21: 4 [drp] via SUBLINGUAL
  Filled 2015-04-18: qty 2

## 2015-04-18 NOTE — Progress Notes (Signed)
Patient ID: [name], female   DOB: 15-Nov-1940, 75 y.o.   MRN: [id number]  [facility] PROGRESS NOTE  MITCHELLE NEWLON [id number] DOB: Jun 10, 1940 DOA: 04/12/2015 PCP: Kandice Hams, MD   Brief narrative:    74 year old African-American female with multiple comorbidities including Crohn's colitis complicated by perirectal fistula with the progressively worsening failure to thrive, weight loss, who was hospitalized recently for dehydration and hyponatremia. She presented again with the difficulty with taking anything orally and failure to thrive and generalized weakness. She was found to have hyponatremia. She was hospitalized for further management. Palliative medicine and gastroenterology was consulted.   Assessment/Plan:    Acute Encephalopathy - Multifactorial secondary to acute illness, malnutrition, hypernatremia, dehydration - Lethargic, difficult to arouse - Family at bedside and very clear that comfort is desired at this point - Provide analgesia as needed, scopolamine patch to help with secretions  Failure to thrive - progressive decline - lethargic, continue with comfort care as desired per family   Complicated Crohn's disease with bleeding from perianal fistula - diagnosed with perirectal abscess in July 2016 that required surgical intervention and diverging ileostomy - no further interventions required per GI team given overall poor functional status  Dehydration and hyponatremia - was on IVF but now more congested  - scopolamine patch to help with secretions   VRE Urinary tract infection - Urine cultures growing VRE - off ABX at this to ensure comfort   History of essential hypertension - unable to take anything PO   History of stage III decubitus ulcer - Wound care nurse has been consulted.  History of DVT - Status post IVC filter placement on 08/30/2014  History of coronary artery disease status post coronary artery bypass grafting - unable to take  PO as noted above - focus on comfort   Severe Malnutrition in context of acute illness/injury - too lethargic this AM, not eating   Functional quadriplegia - due to progressive FTT  Goals of care - full comfort desired   DVT prophylaxis - hold Inj or SCD's for comfort   Code Status: DNR  Family Communication:  plan of care discussed with family at bedside  Disposition Plan: Progressive decline, no plan to discharge   IV access:  Peripheral IV  Procedures and diagnostic studies:    Ct Abdomen Pelvis Wo Contrast 04/05/2015   No acute or unexpected finding after percutaneous gastrostomy tube placement. 2. Multiple chronic findings are stable and described above.   Ct Abdomen Pelvis Wo Contrast 03/26/2015  No acute findings in the abdomen or pelvis. Heavily calcified aorta and branch vessels.  No aneurysm. Right lower quadrant ileostomy, stable. IVC filter placed.   Ir Gastrostomy Tube Mod Sed 04/09/2015  Status post percutaneous gastrostomy tube placement.   Medical Consultants:  [facility]  Other Consultants:  None  IAnti-Infectives:   None  Faye Ramsay, MD  [facility] Pager [phone]  If 7PM-7AM, please contact night-coverage [email or web address] Password [email or web address] 04/18/2015, 12:37 PM   LOS: 8 days   HPI/Subjective: More lethargic per family.   Objective: Filed Vitals:   04/16/15 1517 04/16/15 2042 04/17/15 0508 04/17/15 1453  BP: 112/59 99/61 109/68 123/66  Pulse: 99 93 101 125  Temp: 97.8 F (36.6 C) 98.2 F (36.8 C) 97.7 F (36.5 C) 97.8 F (36.6 C)  TempSrc: Axillary Axillary Axillary Axillary  Resp: 20 20 20 20   SpO2: 99% 99% 97% 90%    Intake/Output Summary (Last 24 hours) at 04/18/15 1237 Last data filed at 04/18/15  0648  Gross per 24 hour  Intake    750 ml  Output    250 ml  Net    500 ml    Exam:   General:  Pt is lethargic, NAD, per family request deferred exam to ensure comfort   Data Reviewed: Basic Metabolic Panel:  Recent Labs Lab  04/12/15 0533 04/13/15 0551 04/14/15 0549 04/16/15 0541  NA 131* 131* 132* 134*  K 3.5 3.3* 3.7 3.9  CL 108 108 108 106  CO2 15* 15* 18* 22  GLUCOSE 97 196* 178* 102*  BUN 11 10 8 8   CREATININE 0.57 0.56 0.46 0.42*  CALCIUM 8.9 8.2* 8.2* 8.1*   CBC:  Recent Labs Lab 04/11/15 1240 04/12/15 0533 04/13/15 0551 04/14/15 0549 04/16/15 0541  WBC 10.0 10.1 4.8 7.2 7.0  HGB 11.3* 11.7* 11.0* 10.7* 10.3*  HCT 35.0* 36.7 34.9* 33.7* 33.1*  MCV 82.9 85.5 84.7 84.9 85.5  PLT 223 210 181 184 180   CBG:  Recent Labs Lab 04/16/15 2038 04/17/15 0006 04/17/15 0456 04/17/15 0751 04/17/15 1201  GLUCAP 86 81 84 84 99    Recent Results (from the past 240 hour(s))  Urine culture     Status: None   Collection Time: 04/15/2015 12:38 PM  Result Value Ref Range Status   Specimen Description URINE, CATHETERIZED  Final   Special Requests NONE  Final   Culture   Final    >=100,000 COLONIES/mL VANCOMYCIN RESISTANT ENTEROCOCCUS ISOLATED Performed at [facility]    Report Status 04/14/2015 FINAL  Final   Organism ID, Bacteria VANCOMYCIN RESISTANT ENTEROCOCCUS ISOLATED  Final      Susceptibility   Vancomycin resistant enterococcus isolated - MIC*    AMPICILLIN >=32 RESISTANT Resistant     LEVOFLOXACIN >=8 RESISTANT Resistant     NITROFURANTOIN 256 RESISTANT Resistant     VANCOMYCIN >=32 RESISTANT Resistant     LINEZOLID 2 SENSITIVE Sensitive     * >=100,000 COLONIES/mL VANCOMYCIN RESISTANT ENTEROCOCCUS ISOLATED  MRSA PCR Screening     Status: None   Collection Time: 04/11/2015 11:45 PM  Result Value Ref Range Status   MRSA by PCR NEGATIVE NEGATIVE Final    Comment:        The GeneXpert MRSA Assay (FDA approved for NASAL specimens only), is one component of a comprehensive MRSA colonization surveillance program. It is not intended to diagnose MRSA infection nor to guide or monitor treatment for MRSA infections.   MRSA PCR Screening     Status: None   Collection Time:  04/14/15  8:44 AM  Result Value Ref Range Status   MRSA by PCR NEGATIVE NEGATIVE Final    Comment:        The GeneXpert MRSA Assay (FDA approved for NASAL specimens only), is one component of a comprehensive MRSA colonization surveillance program. It is not intended to diagnose MRSA infection nor to guide or monitor treatment for MRSA infections.      Scheduled Meds: . ALPRAZolam  1 mg Oral BID  . fentaNYL  25 mcg Transdermal Q72H  . glycopyrrolate  0.4 mg Intravenous Q4H  . mesalamine  4.8 g Oral Daily  . pantoprazole sodium  40 mg Per Tube Daily  . scopolamine  1 patch Transdermal Q72H   Continuous Infusions: . sodium chloride 30 mL/hr at 04/18/15 [phone]

## 2015-04-18 NOTE — Progress Notes (Signed)
NUTRITION NOTE  Pt to d/c to residential hospice. No further nutrition intervention warranted at this time. Should nutrition needs arise, please re-consult.   Jarome Matin, RD, LDN Inpatient Clinical Dietitian Pager # [phone] After hours/weekend pager # [phone]

## 2015-04-18 NOTE — Progress Notes (Signed)
CSW spoke with Palliative Nurse Practitioner and Pt is not stable to go to a Residential Hospice.   CSW to follow for support to Pt/family.   Pete Pelt [facility]  [phone]

## 2015-04-18 NOTE — Progress Notes (Addendum)
Daily Progress Note   Patient Name: [name]       Date: 04/18/2015 DOB: [birth year]  Age: 75 y.o. MRN#: [id number] Attending Physician: [name] Blaze, MD Primary Care Physician: [name] Hams, MD Admit Date: 03/23/2015  Reason for Consultation/Follow-up: Establishing goals of care  Subjective: Daughter at bedside. [name] continues to moan occasionally but appears much more comfortable than previously. Apneic episodes noted and RN at bedside confirms this is new. Explained sign to daughter and that I feel she is progressing and unstable to transition to hospice at this time. Would not be surprised if she died in next 12-24 hours and I did share this with daughter. She is very understanding and says that she does not want her to continue suffering and talks of her mother being with her mother and other family members in heaven.   Brother at bedside this afternoon comments "she's not going anywhere" and when I discuss pain medication he tells me "this is just her talking to [place]." RN reports he does not allow pain medication at times. Reiterated importance of comfort and minimizing suffering at EOL. I also explained to him her progression but I am not sure he believes me.   Length of Stay: 8 days  Current Medications: Scheduled Meds:  . ALPRAZolam  1 mg Oral BID  . fentaNYL  25 mcg Transdermal Q72H  . glycopyrrolate  0.4 mg Intravenous Q4H  . mesalamine  4.8 g Oral Daily  . pantoprazole sodium  40 mg Per Tube Daily  . scopolamine  1 patch Transdermal Q72H    Continuous Infusions: . sodium chloride 30 mL/hr at 04/18/15 0742    PRN Meds: acetaminophen **OR** acetaminophen, antiseptic oral rinse, HYDROmorphone (DILAUDID) injection, LORazepam, ondansetron **OR** ondansetron (ZOFRAN) IV,  polyvinyl alcohol  Physical Exam: Physical Exam  Constitutional: She appears well-developed and well-nourished. She appears lethargic.  Temporal muscle wasting  HENT:  Head: Normocephalic.  Cardiovascular: Tachycardia present.   Pulmonary/Chest: Effort normal. Tachypnea noted.  Shallow breathes  Abdominal: There is tenderness.  + PEG, ostomy  Neurological: She appears lethargic.  + moaning, crying                Vital Signs: BP 123/66 mmHg  Pulse 125  Temp(Src) 97.8 F (36.6 C) (Axillary)  Resp 20  Wt  SpO2 90% SpO2: SpO2: 90 % O2 Device: O2 Device: Not Delivered O2 Flow Rate:    Intake/output summary:   Intake/Output Summary (Last 24 hours) at 04/18/15 1112 Last data filed at 04/18/15 [id number]  Gross per 24 hour  Intake    750 ml  Output    460 ml  Net    290 ml   LBM: Last BM Date: 04/17/15 Baseline Weight: Weight:  (bed do not have scale) Most recent weight: Weight:  (bed do not have scale)       Palliative Assessment/Data: Flowsheet Rows        Most Recent Value   Intake Tab    Referral Department  Hospitalist   Unit at Time of Referral  Med/Surg Unit   Palliative Care Primary Diagnosis  Other (Comment) [GI]   Date Notified  04/12/15   Palliative Care Type  New Palliative care   Reason for referral  Clarify Goals of Care   Date of Admission  04/06/2015   Date first seen by Palliative Care  04/12/15   # of days Palliative referral response time  0 Day(s)   # of days IP prior to Palliative referral  2   Clinical Assessment    Psychosocial & Spiritual Assessment    Palliative Care Outcomes       Additional Data Reviewed: CBC    Component Value Date/Time   WBC 7.0 04/16/2015 0541   RBC 3.87 04/16/2015 0541   HGB 10.3* 04/16/2015 0541   HCT 33.1* 04/16/2015 0541   PLT 180 04/16/2015 0541   MCV 85.5 04/16/2015 0541   MCH 26.6 04/16/2015 0541   MCHC 31.1 04/16/2015 0541   RDW 19.6* 04/16/2015 0541   LYMPHSABS 0.9 04/12/2015 1208   MONOABS 0.5  04/16/2015 1208   EOSABS 0.0 03/21/2015 1208   BASOSABS 0.0 04/16/2015 1208    CMP     Component Value Date/Time   NA 134* 04/16/2015 0541   K 3.9 04/16/2015 0541   CL 106 04/16/2015 0541   CO2 22 04/16/2015 0541   GLUCOSE 102* 04/16/2015 0541   BUN 8 04/16/2015 0541   CREATININE 0.42* 04/16/2015 0541   CREATININE 1.08 03/28/2014 1022   CALCIUM 8.1* 04/16/2015 0541   PROT 7.4 03/25/2015 1208   ALBUMIN 2.7* 04/04/2015 1208   AST 39 04/13/2015 1208   ALT 16 03/28/2015 1208   ALKPHOS 105 04/11/2015 1208   BILITOT 0.3 04/05/2015 1208   GFRNONAA >60 04/16/2015 0541   GFRNONAA 50* 03/28/2014 1022   GFRAA >60 04/16/2015 0541   GFRAA 58* 03/28/2014 1022       Problem List:  Patient Active Problem List   Diagnosis Date Noted  . Decubitus ulcer, stage 3 ([place]) 04/15/2015  . Severe malnutrition ([place]) 04/13/2015  . Pain, abdominal, generalized   . Palliative care encounter 04/12/2015  . FTT (failure to thrive) in adult 04/11/2015  . Dehydration 03/28/2015  . Hypomagnesemia 03/27/2015  . Perirectal abscess   . Protein-calorie malnutrition, severe ([place]) 10/12/2014  . Depression 10/12/2014  . Pancolitis ([place])   . UTI (lower urinary tract infection) 10/10/2014  . Pressure ulcer 10/05/2014  . Inflammatory bowel disease   . Malnutrition of moderate degree ([place]) 09/30/2014  . Rectal bleeding 09/29/2014  . GI bleed 09/29/2014  . Elevated lactic acid level   . Rectal fistula   . Fasciitis   . Cold   . DVT (deep venous thrombosis) ([place])   . Acute ulcerative colitis with rectal bleeding ([place])   .  Abscess, perirectal s/p I&D 08/23/2014 08/27/2014  . Sepsis ([place]) 08/26/2014  . Infection due to yeast 08/26/2014  . Acute blood loss anemia 08/26/2014  . Leukocytosis 08/26/2014  . Hypokalemia 08/26/2014  . Hyponatremia 08/26/2014  . General weakness 08/26/2014  . Thrombocytopenia ([place]) 08/26/2014  . Essential hypertension 07/11/2008  . Inflammatory bowel disease (IBD) with colitis  07/11/2008     Palliative Care Assessment & Plan    1.Code Status:  DNR    Code Status Orders        Start     Ordered   04/17/15 1331  Do not attempt resuscitation (DNR)   Continuous    Question Answer Comment  In the event of cardiac or respiratory ARREST Do not call a "code blue"   In the event of cardiac or respiratory ARREST Do not perform Intubation, CPR, defibrillation or ACLS   In the event of cardiac or respiratory ARREST Use medication by any route, position, wound care, and other measures to relive pain and suffering. May use oxygen, suction and manual treatment of airway obstruction as needed for comfort.      04/17/15 1331    Code Status History    Date Active Date Inactive Code Status Order ID Comments User Context   04/17/2015  1:13 PM 04/17/2015  1:31 PM DNR [id number]  Pershing Proud, NP Inpatient   04/03/2015  4:19 PM 04/17/2015  1:13 PM Full Code [id number]  Kelvin Cellar, MD ED   04/09/2015  3:22 PM 03/22/2015  3:24 AM Full Code [id number]  Corrie Mckusick, DO HOV   03/26/2015  9:07 PM 03/30/2015  5:22 PM Full Code [id number]  Reubin Milan, MD Inpatient   10/10/2014  2:35 PM 10/17/2014  9:42 PM Full Code [id number]  Kelvin Cellar, MD Inpatient   09/29/2014 11:40 AM 10/07/2014  6:07 PM Full Code [id number]  Melton Alar, PA-C Inpatient   08/23/2014  1:45 AM 09/21/2014  8:59 PM Full Code [id number]  Lavina Hamman, MD Inpatient   08/07/2014  4:06 PM 08/21/2014  2:15 PM Full Code [id number]  Domenic Polite, MD Inpatient       2. Goals of Care/Additional Recommendations:  Full comfort care.   Limitations on Scope of Treatment: Full Comfort Care  Desire for further Chaplaincy support:yes  Psycho-social Needs: Caregiving  Support/Resources, Education on Hospice and Grief/Bereavement Support  3. Symptom Management:      1. PAIN: Dilaudid increased 1-2 mg every 30 min prn. Continue Fent patch 25 mcg.      2. Likely anxiety component to suffering: Xanax 1 mg scheduled  BID. Ativan 0.5 mg every 4 hours prn.       3. Secretions/gurgling: Robinul increased 0.4 mg every 4 hours scheduled. Scopolamine patch ordered. Atropine SL prn.   4. Palliative Prophylaxis:   Frequent Pain Assessment, Oral Care and Turn Reposition  5. Prognosis: Likely hours.   6. Discharge Planning:  Hospice facility   Care plan was discussed with Dr. Doyle Askew.   Thank you for allowing the Palliative Medicine Team to assist in the care of this patient.   Time In: 0930-1000, [id number] Total Time 48min Prolonged Time Billed  no         Pershing Proud, NP  [id number], 11:12 AM  Please contact Palliative Medicine Team phone at [phone] for questions and concerns.

## 2015-04-18 NOTE — Progress Notes (Signed)
   04/18/15 1500  Clinical Encounter Type  Visited With Patient and family together  Visit Type Initial;Social support  Referral From Chaplain  Spiritual Encounters  Spiritual Needs Emotional  Stress Factors  Family Stress Factors Loss;Major life changes;Loss of control   Counseling Intern provided brief emotional support to pt's brother at bedside. Pt's brother discussed that he just wants his sister to be comfortable at this point. He discussed family history and reported that he cares greatly for his sister and does not want to see her suffer. Counseling intern provided emotional support and counseling resources including contact information.  Duffy Rhody Counseling Intern

## 2015-04-18 NOTE — Progress Notes (Signed)
Chaplain referred by staff. [name] is actively dying. Her son is at her bedside providing care and encouragement. 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 At the time of this visit [name] was unable to speak clearly, but puffed out her acknowledgement of the chaplain's presence. The family is prepared for her dying and does not foresee her living much longer. They requested prayer for her to die at peace and out of pain.  The family was not able to attend [facility] Wednesday Services because of [name]'s pending death. The chaplain led them in a private Ash Wednesday commemoration and imposed ashes on their foreheads.  Chaplain available at all times as requested or needed.  Sallee Lange. [place], DMin, MDiv, MA Chaplain

## 2015-04-18 DEATH — deceased

## 2015-04-19 DIAGNOSIS — L8993 Pressure ulcer of unspecified site, stage 3: Secondary | ICD-10-CM

## 2015-04-19 DIAGNOSIS — R1013 Epigastric pain: Secondary | ICD-10-CM

## 2015-04-19 NOTE — Progress Notes (Signed)
Patient ID: [name], female   DOB: 01/21/1941, 75 y.o.   MRN: [id number]  [facility] PROGRESS NOTE  [name] [id number] DOB: [birth year] DOA: 04/08/2015 PCP: Kandice Hams, MD   Brief narrative:    75 year old African-American female with multiple comorbidities including Crohn's colitis complicated by perirectal fistula with the progressively worsening failure to thrive, weight loss, who was hospitalized recently for dehydration and hyponatremia. She presented again with the difficulty with taking anything orally and failure to thrive and generalized weakness. She was found to have hyponatremia. She was hospitalized for further management. Palliative medicine and gastroenterology was consulted.   Assessment/Plan:    Acute Encephalopathy - Multifactorial secondary to acute illness, malnutrition, hypernatremia, dehydration - Moaning this morning, appears to be in the little bit more pain - Family at bedside and very clear that comfort is desired at this point - Provide analgesia as needed, scopolamine patch to help with secretions  Failure to thrive - progressive decline - Has not been taking anything by mouth for several days - Full comfort desired  Complicated Crohn's disease with bleeding from perianal fistula - diagnosed with perirectal abscess in July 2016 that required surgical intervention and diverging ileostomy - no further interventions required per GI team given overall poor functional status  Dehydration and hyponatremia - scopolamine patch to help with secretions   VRE Urinary tract infection - Urine cultures growing VRE - off ABX at this to ensure comfort   History of essential hypertension - unable to take anything PO   History of stage III decubitus ulcer - Wound care nurse has been consulted.  History of DVT - Status post IVC filter placement on 08/30/2014  History of coronary artery disease status post coronary artery bypass grafting -  unable to take PO as noted above - focus on comfort   Severe Malnutrition in context of acute illness/injury - Full comfort as outlined above  Functional quadriplegia - due to progressive FTT   DVT prophylaxis - hold Inj or SCD's for comfort   Code Status: DNR  Family Communication:  plan of care discussed with family at bedside  Disposition Plan: Progressive decline, no plan to discharge   IV access:  Peripheral IV  Procedures and diagnostic studies:    Ct Abdomen Pelvis Wo Contrast 04/01/2015   No acute or unexpected finding after percutaneous gastrostomy tube placement. 2. Multiple chronic findings are stable and described above.   Ct Abdomen Pelvis Wo Contrast 03/26/2015  No acute findings in the abdomen or pelvis. Heavily calcified aorta and branch vessels.  No aneurysm. Right lower quadrant ileostomy, stable. IVC filter placed.   Ir Gastrostomy Tube Mod Sed 04/09/2015  Status post percutaneous gastrostomy tube placement.   Medical Consultants:  PCT GI  Other Consultants:  None  IAnti-Infectives:   None  Faye Ramsay, MD  [facility] Pager [phone]  If 7PM-7AM, please contact night-coverage [email or web address] Password [facility] 04/19/2015, 9:58 AM   LOS: 9 days   HPI/Subjective: More moaning this morning, not opening eyes  Objective: Filed Vitals:   04/16/15 1517 04/16/15 2042 04/17/15 0508 04/17/15 1453  BP: 112/59 99/61 109/68 123/66  Pulse: 99 93 101 125  Temp: 97.8 F (36.6 C) 98.2 F (36.8 C) 97.7 F (36.5 C) 97.8 F (36.6 C)  TempSrc: Axillary Axillary Axillary Axillary  Resp: 20 20 20 20   SpO2: 99% 99% 97% 90%    Intake/Output Summary (Last 24 hours) at 04/19/15 0958 Last data filed at 04/19/15 0600  Gross per 24  hour  Intake    720 ml  Output    160 ml  Net    560 ml    Exam:   General:  Pt is moaning, appears date uncomfortable, family again asked to defer exam and to provide analgesia to ensure comfort  Data Reviewed: Basic Metabolic  Panel:  Recent Labs Lab 04/13/15 0551 04/14/15 0549 04/16/15 0541  NA 131* 132* 134*  K 3.3* 3.7 3.9  CL 108 108 106  CO2 15* 18* 22  GLUCOSE 196* 178* 102*  BUN 10 8 8   CREATININE 0.56 0.46 0.42*  CALCIUM 8.2* 8.2* 8.1*   CBC:  Recent Labs Lab 04/13/15 0551 04/14/15 0549 04/16/15 0541  WBC 4.8 7.2 7.0  HGB 11.0* 10.7* 10.3*  HCT 34.9* 33.7* 33.1*  MCV 84.7 84.9 85.5  PLT 181 184 180   CBG:  Recent Labs Lab 04/16/15 2038 04/17/15 0006 04/17/15 0456 04/17/15 0751 04/17/15 1201  GLUCAP 86 81 84 84 99    Recent Results (from the past 240 hour(s))  Urine culture     Status: None   Collection Time: 03/27/2015 12:38 PM  Result Value Ref Range Status   Specimen Description URINE, CATHETERIZED  Final   Special Requests NONE  Final   Culture   Final    >=100,000 COLONIES/mL VANCOMYCIN RESISTANT ENTEROCOCCUS ISOLATED Performed at [facility]    Report Status 04/14/2015 FINAL  Final   Organism ID, Bacteria VANCOMYCIN RESISTANT ENTEROCOCCUS ISOLATED  Final      Susceptibility   Vancomycin resistant enterococcus isolated - MIC*    AMPICILLIN >=32 RESISTANT Resistant     LEVOFLOXACIN >=8 RESISTANT Resistant     NITROFURANTOIN 256 RESISTANT Resistant     VANCOMYCIN >=32 RESISTANT Resistant     LINEZOLID 2 SENSITIVE Sensitive     * >=100,000 COLONIES/mL VANCOMYCIN RESISTANT ENTEROCOCCUS ISOLATED  MRSA PCR Screening     Status: None   Collection Time: 04/15/2015 11:45 PM  Result Value Ref Range Status   MRSA by PCR NEGATIVE NEGATIVE Final    Comment:        The GeneXpert MRSA Assay (FDA approved for NASAL specimens only), is one component of a comprehensive MRSA colonization surveillance program. It is not intended to diagnose MRSA infection nor to guide or monitor treatment for MRSA infections.   MRSA PCR Screening     Status: None   Collection Time: 04/14/15  8:44 AM  Result Value Ref Range Status   MRSA by PCR NEGATIVE NEGATIVE Final    Comment:         The GeneXpert MRSA Assay (FDA approved for NASAL specimens only), is one component of a comprehensive MRSA colonization surveillance program. It is not intended to diagnose MRSA infection nor to guide or monitor treatment for MRSA infections.      Scheduled Meds: . ALPRAZolam  1 mg Oral BID  . fentaNYL  25 mcg Transdermal Q72H  . glycopyrrolate  0.4 mg Intravenous Q4H  . mesalamine  4.8 g Oral Daily  . pantoprazole sodium  40 mg Per Tube Daily  . scopolamine  1 patch Transdermal Q72H   Continuous Infusions: . sodium chloride 30 mL/hr at 04/18/15 [phone]

## 2015-04-20 MED ORDER — SODIUM CHLORIDE 0.9% FLUSH
3.0000 mL | Freq: Three times a day (TID) | INTRAVENOUS | Status: DC
Start: 2015-04-20 — End: 2015-04-23
  Administered 2015-04-20 – 2015-04-22 (×7): 3 mL via INTRAVENOUS

## 2015-04-20 NOTE — Progress Notes (Signed)
Ms. Bowman still appears to be actively dying. Breathing is still very shallow and noted apneic episodes. Appears more comfortable today and secretions improved. No changes to regimen.   Vinie Sill, NP Palliative Medicine Team Pager # [phone] (M-F 8a-5p) Team Phone # [phone] (Nights/Weekends)

## 2015-04-20 NOTE — Progress Notes (Signed)
Daily Progress Note   Patient Name: [name]       Date: 04/20/2015 DOB: 04-Jun-1940  Age: 75 y.o. MRN#: [id number] Attending Physician: Theodis Blaze, MD Primary Care Physician: Kandice Hams, MD Admit Date: 04/09/2015  Reason for Consultation/Follow-up: Establishing goals of care  Subjective: Daughter at bedside. There is some confusion today and I answered her questions. This is daughter, [name], but her sister has been the one at bedside whom I have been communicating with. I was under the impression that family was talking together but [name] had not been updated about the decision that her mother has been thought too fragile and unstable to transfer to hospice facility (this was discussed with Dr. Doyle Askew who agreed with this recommendation). I discussed this further and explained my concerns with transfer and the risk involved but left it to [name] if she would like to pursue transfer further. After discussion [name] she has decided not to pursue transfer after explaining signs of progression and the fact that her mother hurts and moans even being turned gently by nursing. We discussed the concern of the hospital environment and I discussed with nursing a plan to remove IV pump and unnecessary medical equipment to allow for a more natural/comfortable environment. Emotional support provided to family.   Length of Stay: 10 days  Current Medications: Scheduled Meds:  . ALPRAZolam  1 mg Oral BID  . fentaNYL  25 mcg Transdermal Q72H  . glycopyrrolate  0.4 mg Intravenous Q4H  . mesalamine  4.8 g Oral Daily  . pantoprazole sodium  40 mg Per Tube Daily  . scopolamine  1 patch Transdermal Q72H  . sodium chloride flush  3 mL Intravenous Q8H    Continuous Infusions:    PRN  Meds: acetaminophen **OR** acetaminophen, antiseptic oral rinse, atropine, HYDROmorphone (DILAUDID) injection, LORazepam, ondansetron **OR** ondansetron (ZOFRAN) IV, polyvinyl alcohol  Physical Exam: Physical Exam  Constitutional: She appears well-developed and well-nourished. She appears lethargic.  Temporal muscle wasting  HENT:  Head: Normocephalic.  Cardiovascular: Tachycardia present.   Pulmonary/Chest: Effort normal. Tachypnea noted.  Shallow breathes  Abdominal: There is tenderness.  + PEG, ostomy  Neurological: She appears lethargic.  + moaning, crying                Vital Signs: BP 96/65 mmHg  Pulse 123  Temp(Src) 98.1 F (36.7 C) (Axillary)  Resp 10  Wt   SpO2 97% SpO2: SpO2: 97 % O2 Device: O2 Device: Not Delivered O2 Flow Rate:    Intake/output summary:   Intake/Output Summary (Last 24 hours) at 04/20/15 1759 Last data filed at 04/20/15 0549  Gross per 24 hour  Intake 721.22 ml  Output    250 ml  Net 471.22 ml   LBM: Last BM Date: 04/17/15 Baseline Weight: Weight:  (bed do not have scale) Most recent weight: Weight:  (bed do not have scale)       Palliative Assessment/Data: Flowsheet Rows        Most Recent Value   Intake Tab    Referral Department  Hospitalist   Unit at Time of Referral  Med/Surg Unit   Palliative Care Primary Diagnosis  Other (Comment) [GI]   Date Notified  04/12/15   Palliative Care Type  New Palliative care   Reason for referral  Clarify Goals of Care   Date of Admission  03/26/2015   Date first seen by Palliative Care  04/12/15   # of days Palliative referral response time  0 Day(s)   # of days IP prior to Palliative referral  2   Clinical Assessment    Psychosocial & Spiritual Assessment    Palliative Care Outcomes       Additional Data Reviewed: CBC    Component Value Date/Time   WBC 7.0 04/16/2015 0541   RBC 3.87 04/16/2015 0541   HGB 10.3* 04/16/2015 0541   HCT 33.1* 04/16/2015 0541   PLT 180 04/16/2015 0541    MCV 85.5 04/16/2015 0541   MCH 26.6 04/16/2015 0541   MCHC 31.1 04/16/2015 0541   RDW 19.6* 04/16/2015 0541   LYMPHSABS 0.9 03/26/2015 1208   MONOABS 0.5 04/14/2015 1208   EOSABS 0.0 03/29/2015 1208   BASOSABS 0.0 04/01/2015 1208    CMP     Component Value Date/Time   NA 134* 04/16/2015 0541   K 3.9 04/16/2015 0541   CL 106 04/16/2015 0541   CO2 22 04/16/2015 0541   GLUCOSE 102* 04/16/2015 0541   BUN 8 04/16/2015 0541   CREATININE 0.42* 04/16/2015 0541   CREATININE 1.08 03/28/2014 1022   CALCIUM 8.1* 04/16/2015 0541   PROT 7.4 04/09/2015 1208   ALBUMIN 2.7* 03/22/2015 1208   AST 39 03/23/2015 1208   ALT 16 04/09/2015 1208   ALKPHOS 105 03/29/2015 1208   BILITOT 0.3 04/14/2015 1208   GFRNONAA >60 04/16/2015 0541   GFRNONAA 50* 03/28/2014 1022   GFRAA >60 04/16/2015 0541   GFRAA 58* 03/28/2014 1022       Problem List:  Patient Active Problem List   Diagnosis Date Noted  . Abdominal pain   . Decubitus ulcer, stage 3 ([place]) 04/15/2015  . Severe malnutrition ([place]) 04/13/2015  . Pain, abdominal, generalized   . Palliative care encounter 04/12/2015  . FTT (failure to thrive) in adult 04/04/2015  . Dehydration 03/25/2015  . Hypomagnesemia 03/27/2015  . Perirectal abscess   . Protein-calorie malnutrition, severe ([place]) 10/12/2014  . Depression 10/12/2014  . Pancolitis ([place])   . UTI (lower urinary tract infection) 10/10/2014  . Pressure ulcer 10/05/2014  . Inflammatory bowel disease   . Malnutrition of moderate degree ([place]) 09/30/2014  . Rectal bleeding 09/29/2014  . GI bleed 09/29/2014  . Elevated lactic acid level   . Rectal fistula   . Fasciitis   . Cold   . DVT (deep  venous thrombosis) ([place])   . Acute ulcerative colitis with rectal bleeding ([place])   . Abscess, perirectal s/p I&D 08/23/2014 08/27/2014  . Sepsis ([place]) 08/26/2014  . Infection due to yeast 08/26/2014  . Acute blood loss anemia 08/26/2014  . Leukocytosis 08/26/2014  . Hypokalemia 08/26/2014  .  Hyponatremia 08/26/2014  . General weakness 08/26/2014  . Thrombocytopenia ([place]) 08/26/2014  . Essential hypertension 07/11/2008  . Inflammatory bowel disease (IBD) with colitis 07/11/2008     Palliative Care Assessment & Plan    1.Code Status:  DNR    Code Status Orders        Start     Ordered   04/17/15 1331  Do not attempt resuscitation (DNR)   Continuous    Question Answer Comment  In the event of cardiac or respiratory ARREST Do not call a "code blue"   In the event of cardiac or respiratory ARREST Do not perform Intubation, CPR, defibrillation or ACLS   In the event of cardiac or respiratory ARREST Use medication by any route, position, wound care, and other measures to relive pain and suffering. May use oxygen, suction and manual treatment of airway obstruction as needed for comfort.      04/17/15 1331    Code Status History    Date Active Date Inactive Code Status Order ID Comments User Context   04/17/2015  1:13 PM 04/17/2015  1:31 PM DNR [id number]  Pershing Proud, NP Inpatient   04/09/2015  4:19 PM 04/17/2015  1:13 PM Full Code [id number]  Kelvin Cellar, MD ED   04/09/2015  3:22 PM 04/08/2015  3:24 AM Full Code [id number]  Corrie Mckusick, DO HOV   03/26/2015  9:07 PM 03/30/2015  5:22 PM Full Code [id number]  Reubin Milan, MD Inpatient   10/10/2014  2:35 PM 10/17/2014  9:42 PM Full Code [id number]  Kelvin Cellar, MD Inpatient   09/29/2014 11:40 AM 10/07/2014  6:07 PM Full Code [id number]  Melton Alar, PA-C Inpatient   08/23/2014  1:45 AM 09/21/2014  8:59 PM Full Code [id number]  Lavina Hamman, MD Inpatient   08/07/2014  4:06 PM 08/21/2014  2:15 PM Full Code [id number]  Domenic Polite, MD Inpatient       2. Goals of Care/Additional Recommendations:  Full comfort care.   Limitations on Scope of Treatment: Full Comfort Care  Desire for further Chaplaincy support:yes  Psycho-social Needs: Caregiving  Support/Resources, Education on Hospice and Grief/Bereavement  Support  3. Symptom Management:      1. PAIN: Dilaudid increased 1-2 mg every 30 min prn. Continue Fent patch 25 mcg.      2. Likely anxiety component to suffering: Xanax 1 mg scheduled BID. Ativan 0.5 mg every 4 hours prn.       3. Secretions/gurgling: Robinul increased 0.4 mg every 4 hours scheduled. Scopolamine patch ordered. Atropine SL prn.   4. Palliative Prophylaxis:   Frequent Pain Assessment, Oral Care and Turn Reposition  5. Prognosis: Likely hours. She continues to exhibit signs of actively dying with apneic episodes. Explained signs of apnea to daughter at bedside and gurgling sounds.   6. Discharge Planning:  Hospital death expected. Unstable to move.    Thank you for allowing the Palliative Medicine Team to assist in the care of this patient.   Time In: [id number] Total Time 29min Prolonged Time Billed  no         Pershing Proud, NP  [id number], 5:59 PM  Please contact Palliative Medicine Team phone  at [phone] for questions and concerns.

## 2015-04-20 NOTE — Progress Notes (Signed)
Patient ID: [name], female   DOB: 02/11/1941, 75 y.o.   MRN: [id number]  [facility] PROGRESS NOTE  ZYIONNA BRACCIO [id number] DOB: 1941/01/26 DOA: 04/14/2015 PCP: Kandice Hams, MD   Brief narrative:    75 year old African-American female with multiple comorbidities including Crohn's colitis complicated by perirectal fistula with the progressively worsening failure to thrive, weight loss, who was hospitalized recently for dehydration and hyponatremia. She presented again with the difficulty with taking anything orally and failure to thrive and generalized weakness. She was found to have hyponatremia. She was hospitalized for further management. Palliative medicine and gastroenterology was consulted.   Assessment/Plan:    Acute Encephalopathy - Multifactorial secondary to acute illness, malnutrition, hypernatremia, dehydration - Family at bedside, pt comfortable  - PCT following   Failure to thrive - progressive decline - Has not been taking anything by mouth for several days - Full comfort desired  Complicated Crohn's disease with bleeding from perianal fistula - diagnosed with perirectal abscess in July 2016 that required surgical intervention and diverging ileostomy - no further interventions required per GI team given overall poor functional status  Dehydration and hyponatremia - scopolamine patch to help with secretions   VRE Urinary tract infection - Urine cultures growing VRE - off ABX at this to ensure comfort   History of essential hypertension - unable to take anything PO   History of stage III decubitus ulcer - Wound care nurse has been consulted.  History of DVT - Status post IVC filter placement on 08/30/2014  History of coronary artery disease status post coronary artery bypass grafting - unable to take PO as noted above - focus on comfort   Severe Malnutrition in context of acute illness/injury - Full comfort as outlined above  Functional  quadriplegia - due to progressive FTT  DVT prophylaxis - hold Inj or SCD's for comfort   Code Status: DNR  Family Communication:  plan of care discussed with family at bedside  Disposition Plan: Progressive decline, no plan to discharge   IV access:  Peripheral IV  Procedures and diagnostic studies:    Ct Abdomen Pelvis Wo Contrast 03/22/2015   No acute or unexpected finding after percutaneous gastrostomy tube placement. 2. Multiple chronic findings are stable and described above.   Ct Abdomen Pelvis Wo Contrast 03/26/2015  No acute findings in the abdomen or pelvis. Heavily calcified aorta and branch vessels.  No aneurysm. Right lower quadrant ileostomy, stable. IVC filter placed.   Ir Gastrostomy Tube Mod Sed 04/09/2015  Status post percutaneous gastrostomy tube placement.   Medical Consultants:  PCT GI  Other Consultants:  None  IAnti-Infectives:   None  Faye Ramsay, MD  [facility] Pager [phone]  If 7PM-7AM, please contact night-coverage [email or web address] Password [facility] 04/20/2015, 10:37 AM   LOS: 10 days   HPI/Subjective: More moaning this morning, not opening eyes  Objective: Filed Vitals:   04/17/15 0508 04/17/15 1453 04/19/15 1300 04/20/15 0559  BP: 109/68 123/66  102/77  Pulse: 101 125 112 127  Temp: 97.7 F (36.5 C) 97.8 F (36.6 C)  98.3 F (36.8 C)  TempSrc: Axillary Axillary  Axillary  Resp: 20 20  20   SpO2: 0000000 90% 93%     Intake/Output Summary (Last 24 hours) at 04/20/15 1037 Last data filed at 04/20/15 0549  Gross per 24 hour  Intake 721.22 ml  Output    625 ml  Net  96.22 ml    Exam:   General:  Pt is moaning, appears date uncomfortable,  family again asked to defer exam and to provide analgesia to ensure comfort  Data Reviewed: Basic Metabolic Panel:  Recent Labs Lab 04/14/15 0549 04/16/15 0541  NA 132* 134*  K 3.7 3.9  CL 108 106  CO2 18* 22  GLUCOSE 178* 102*  BUN 8 8  CREATININE 0.46 0.42*  CALCIUM 8.2* 8.1*    CBC:  Recent Labs Lab 04/14/15 0549 04/16/15 0541  WBC 7.2 7.0  HGB 10.7* 10.3*  HCT 33.7* 33.1*  MCV 84.9 85.5  PLT 184 180   CBG:  Recent Labs Lab 04/16/15 2038 04/17/15 0006 04/17/15 0456 04/17/15 0751 04/17/15 1201  GLUCAP 86 81 84 84 99    Recent Results (from the past 240 hour(s))  Urine culture     Status: None   Collection Time: 03/23/2015 12:38 PM  Result Value Ref Range Status   Specimen Description URINE, CATHETERIZED  Final   Special Requests NONE  Final   Culture   Final    >=100,000 COLONIES/mL VANCOMYCIN RESISTANT ENTEROCOCCUS ISOLATED Performed at [facility]    Report Status 04/14/2015 FINAL  Final   Organism ID, Bacteria VANCOMYCIN RESISTANT ENTEROCOCCUS ISOLATED  Final      Susceptibility   Vancomycin resistant enterococcus isolated - MIC*    AMPICILLIN >=32 RESISTANT Resistant     LEVOFLOXACIN >=8 RESISTANT Resistant     NITROFURANTOIN 256 RESISTANT Resistant     VANCOMYCIN >=32 RESISTANT Resistant     LINEZOLID 2 SENSITIVE Sensitive     * >=100,000 COLONIES/mL VANCOMYCIN RESISTANT ENTEROCOCCUS ISOLATED  MRSA PCR Screening     Status: None   Collection Time: 03/29/2015 11:45 PM  Result Value Ref Range Status   MRSA by PCR NEGATIVE NEGATIVE Final    Comment:        The GeneXpert MRSA Assay (FDA approved for NASAL specimens only), is one component of a comprehensive MRSA colonization surveillance program. It is not intended to diagnose MRSA infection nor to guide or monitor treatment for MRSA infections.   MRSA PCR Screening     Status: None   Collection Time: 04/14/15  8:44 AM  Result Value Ref Range Status   MRSA by PCR NEGATIVE NEGATIVE Final    Comment:        The GeneXpert MRSA Assay (FDA approved for NASAL specimens only), is one component of a comprehensive MRSA colonization surveillance program. It is not intended to diagnose MRSA infection nor to guide or monitor treatment for MRSA infections.       Scheduled Meds: . ALPRAZolam  1 mg Oral BID  . fentaNYL  25 mcg Transdermal Q72H  . glycopyrrolate  0.4 mg Intravenous Q4H  . mesalamine  4.8 g Oral Daily  . pantoprazole sodium  40 mg Per Tube Daily  . scopolamine  1 patch Transdermal Q72H   Continuous Infusions: . sodium chloride 30 mL/hr at 04/19/15 1622

## 2015-04-20 NOTE — Progress Notes (Signed)
Date:  April 20, 2015 Chart reviewed for concurrent status and case management needs. Will continue to follow patient for changes and needs: patient is actively dying po tube feed being readjusted Velva Harman, BSN, Therapist, sports, [place]   [phone]

## 2015-04-21 MED ORDER — LORAZEPAM 2 MG/ML IJ SOLN
1.0000 mg | INTRAMUSCULAR | Status: DC | PRN
  Administered 2015-04-21 – 2015-04-23 (×10): 2 mg via INTRAVENOUS
  Filled 2015-04-21 (×10): qty 1

## 2015-04-21 NOTE — Progress Notes (Addendum)
Patient ID: [name], female   DOB: 12-May-1940, 75 y.o.   MRN: [id number]  [facility] PROGRESS NOTE  [name] [id number] DOB: [birth year] DOA: 03/29/2015 PCP: [name] Hams, MD   Brief narrative:    75 year old African-American female with multiple comorbidities including Crohn's colitis complicated by perirectal fistula with the progressively worsening failure to thrive, weight loss, who was hospitalized recently for dehydration and hyponatremia. She presented again with the difficulty with taking anything orally and failure to thrive and generalized weakness. She was found to have hyponatremia. She was hospitalized for further management. Palliative medicine and gastroenterology was consulted.   Assessment/Plan:    Acute Encephalopathy - Multifactorial secondary to acute illness, malnutrition, hypernatremia, dehydration - Family at bedside, pt comfortable, emotional support offered  - pt with no agitation on increased dose of Ativan  - family very thankful for our help   Failure to thrive - Has not been taking anything by mouth for several days - Full comfort allowed, family appreciate it   Complicated Crohn's disease with bleeding from perianal fistula - diagnosed with perirectal abscess in July 2016 that required surgical intervention and diverging ileostomy - no further interventions required per GI team given overall poor functional status  Dehydration and hyponatremia - scopolamine patch to help with secretions   VRE Urinary tract infection - Urine cultures growing VRE - off ABX at this to ensure comfort   History of essential hypertension - unable to take anything PO   History of stage III decubitus ulcer - Wound care nurse has been consulted.  History of DVT - Status post IVC filter placement on 08/30/2014  History of coronary artery disease status post coronary artery bypass grafting - unable to take PO as noted above - focus on comfort    Severe Malnutrition in context of acute illness/injury - Full comfort as outlined above  Functional quadriplegia - due to progressive FTT  DVT prophylaxis - hold Inj or SCD's for comfort   Code Status: DNR  Family Communication:  plan of care discussed with family at bedside  Disposition Plan: Progressive decline, no plan to discharge   IV access:  Peripheral IV  Procedures and diagnostic studies:    Ct Abdomen Pelvis Wo Contrast 04/11/2015   No acute or unexpected finding after percutaneous gastrostomy tube placement. 2. Multiple chronic findings are stable and described above.   Ct Abdomen Pelvis Wo Contrast 03/26/2015  No acute findings in the abdomen or pelvis. Heavily calcified aorta and branch vessels.  No aneurysm. Right lower quadrant ileostomy, stable. IVC filter placed.   Ir Gastrostomy Tube Mod Sed 04/09/2015  Status post percutaneous gastrostomy tube placement.   Medical Consultants:  [facility]  Other Consultants:  None  IAnti-Infectives:   None  [name] Ramsay, MD  [facility] Pager [phone]  If 7PM-7AM, please contact night-coverage [email or web address] Password [facility] 04/21/2015, 8:33 AM   LOS: 11 days   HPI/Subjective: More moaning this morning, not opening eyes  Objective: Filed Vitals:   04/20/15 0559 04/20/15 1455 04/20/15 2213 04/21/15 0634  BP: 102/77 96/65 130/97 141/90  Pulse: 127 123 132 139  Temp: 98.3 F (36.8 C) 98.1 F (36.7 C) 98 F (36.7 C) 99.2 F (37.3 C)  TempSrc: Axillary Axillary Axillary Axillary  Resp: 20 10 12    SpO2:  97% 90% 89%    Intake/Output Summary (Last 24 hours) at 04/21/15 0833 Last data filed at 04/21/15 0650  Gross per 24 hour  Intake      0  ml  Output    575 ml  Net   -575 ml    Exam:   General:  Pt is moaning, appears date uncomfortable, family again asked to defer exam and to provide analgesia to ensure comfort  Data Reviewed: Basic Metabolic Panel:  Recent Labs Lab 04/16/15 0541  NA 134*  K 3.9   CL 106  CO2 22  GLUCOSE 102*  BUN 8  CREATININE 0.42*  CALCIUM 8.1*   CBC:  Recent Labs Lab 04/16/15 0541  WBC 7.0  HGB 10.3*  HCT 33.1*  MCV 85.5  PLT 180   CBG:  Recent Labs Lab 04/16/15 2038 04/17/15 0006 04/17/15 0456 04/17/15 0751 04/17/15 1201  GLUCAP 86 81 84 84 99    Recent Results (from the past 240 hour(s))  MRSA PCR Screening     Status: None   Collection Time: 04/14/15  8:44 AM  Result Value Ref Range Status   MRSA by PCR NEGATIVE NEGATIVE Final    Comment:        The GeneXpert MRSA Assay (FDA approved for NASAL specimens only), is one component of a comprehensive MRSA colonization surveillance program. It is not intended to diagnose MRSA infection nor to guide or monitor treatment for MRSA infections.      Scheduled Meds: . ALPRAZolam  1 mg Oral BID  . fentaNYL  25 mcg Transdermal Q72H  . glycopyrrolate  0.4 mg Intravenous Q4H  . mesalamine  4.8 g Oral Daily  . pantoprazole sodium  40 mg Per Tube Daily  . scopolamine  1 patch Transdermal Q72H  . sodium chloride flush  3 mL Intravenous Q8H   Continuous Infusions:

## 2015-05-04 ENCOUNTER — Encounter (HOSPITAL_COMMUNITY): Payer: BC Managed Care – PPO

## 2015-05-19 NOTE — Discharge Summary (Addendum)
  Death Summary  [name] [id number] DOB: 05-28-1940 DOA: 10-May-2015  PCP: Kandice Hams, MD PCP/Office notified:   Admit date: 05-10-2015 Date of Death: May 23, 2015  Final Diagnoses:  Principal Problem:   FTT (failure to thrive) in adult Active Problems:   Essential hypertension   Hyponatremia   Rectal fistula   Inflammatory bowel disease   UTI (lower urinary tract infection)   Dehydration   Palliative care encounter   Severe malnutrition (HCC)   Pain, abdominal, generalized   Decubitus ulcer, stage 3 (HCC)   Abdominal pain     Brief narrative:    75 year old African-American female with multiple comorbidities including Crohn's colitis complicated by perirectal fistula with the progressively worsening failure to thrive, weight loss, who was hospitalized recently for dehydration and hyponatremia. She presented again with the difficulty with taking anything orally and failure to thrive and generalized weakness. She was found to have hyponatremia. She was hospitalized for further management. Palliative medicine and gastroenterology was consulted.   Assessment/Plan:    Acute Encephalopathy - Multifactorial secondary to acute illness, malnutrition, hypernatremia, dehydration - Family at bedside, pt comfortable, emotional support offered  - family very thankful for our help  - patient passed away this AM  Failure to thrive - Has not been taking anything by mouth for several days - Full comfort allowed, family appreciate it   Complicated Crohn's disease with bleeding from perianal fistula - diagnosed with perirectal abscess in July 2016 that required surgical intervention and diverging ileostomy - no further interventions required per GI team   Dehydration and hyponatremia - scopolamine patch to help with secretions provided   VRE Urinary tract infection - Urine cultures growing VRE - off ABX to ensure comfort   History of essential hypertension - unable to  take anything PO   History of stage III decubitus ulcer - Stage 3 coccyx pressure injury, Gluteal cleft: 3cm x 1cm x 0.5 cm, Left buttock 0.5cm x 0.5cm x 0.1cm and 1cm x 0.5cm x 0.1cm   WOC ostomy consult note Stoma type/location: RLQ, end ileostomy  History of DVT - Status post IVC filter placement on 08/30/2014  History of coronary artery disease status post coronary artery bypass grafting - unable to take PO as noted above - focus on comfort   Severe Malnutrition in context of acute illness/injury - Full comfort as outlined above  Functional quadriplegia - due to progressive FTT  DVT prophylaxis - hold Inj or SCD's for comfort   Code Status: DNR  Family Communication: plan of care discussed with family at bedside   IV access:  Peripheral IV  Procedures and diagnostic studies:   Ct Abdomen Pelvis Wo Contrast 10-May-2015 No acute or unexpected finding after percutaneous gastrostomy tube placement. 2. Multiple chronic findings are stable and described above.   Ct Abdomen Pelvis Wo Contrast 03/26/2015 No acute findings in the abdomen or pelvis. Heavily calcified aorta and branch vessels. No aneurysm. Right lower quadrant ileostomy, stable. IVC filter placed.   Ir Gastrostomy Tube Mod Sed 04/09/2015 Status post percutaneous gastrostomy tube placement.   Medical Consultants:  [facility]  Other Consultants:  None  IAnti-Infectives:   None  Faye Ramsay, MD [facility] Pager [phone]  If 7PM-7AM, please contact night-coverage [email or web address] Password [email or web address] 04/21/2015, 8:33 AM      Time: 30 minutes   Signed:  Faye Ramsay  [facility] 05/02/2015, 10:38 PM

## 2015-05-19 NOTE — Progress Notes (Signed)
This shift pt expired at 0558, confirmed by Charge R.N, Vanita Ingles and R.N Lauro Regulus. Son and daughter in the room at the time of expiration. Attending notified. Foley and peripheral IV removed.

## 2015-05-19 DEATH — deceased

## 2015-08-23 DIAGNOSIS — Z515 Encounter for palliative care: Secondary | ICD-10-CM | POA: Insufficient documentation

## 2016-03-13 ENCOUNTER — Encounter (HOSPITAL_COMMUNITY): Payer: Self-pay

## 2016-03-13 ENCOUNTER — Ambulatory Visit: Payer: Self-pay | Admitting: Family

## 2016-11-12 IMAGING — CT CT ABD-PELV W/ CM
2 of 6 series · 14 of 46 positions shown, 16 images · IV contrast (omnipaque)
Comparison: CT 08/20/2006.

CLINICAL DATA: Patient with abdominal pain and rectal bleeding
beginning this morning. Patient with recent ulcerative colitis flare
up.

EXAM:
CT ABDOMEN AND PELVIS WITH CONTRAST
TECHNIQUE: Multidetector CT imaging of the abdomen and pelvis was performed
using the standard protocol following bolus administration of
intravenous contrast.
CONTRAST:  100mL OMNIPAQUE IOHEXOL 300 MG/ML  SOLN

[Series 2: abd/pel with · axial · 0.98mm/px · z∈[+812,+1286]mm · 11 of 109 slices shown, 13 images]
[im 7/109  soft-tissue]
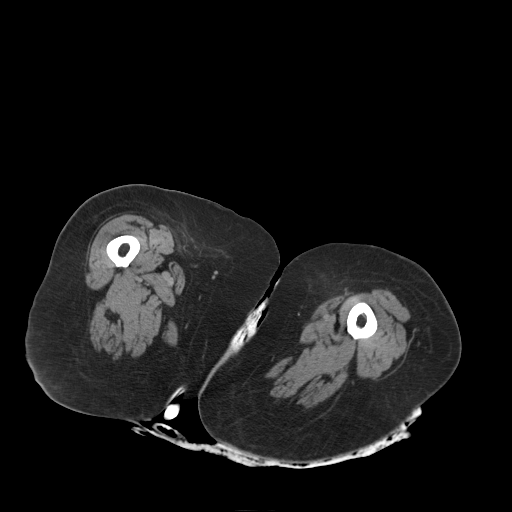
[im 7/109  bone]
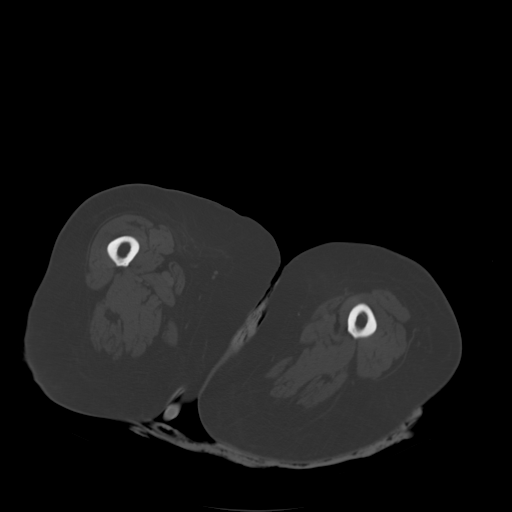
[im 20/109  soft-tissue]
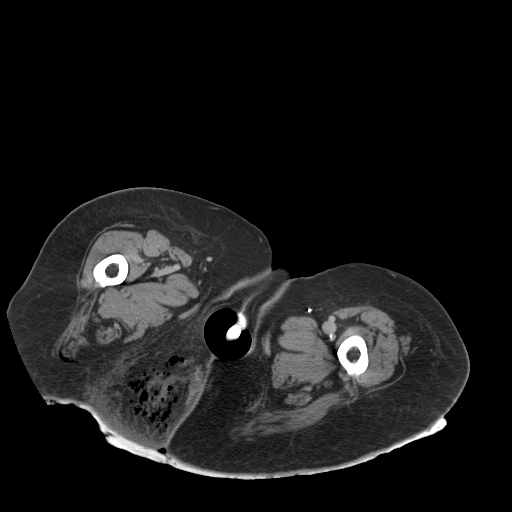
[im 26/109  soft-tissue]
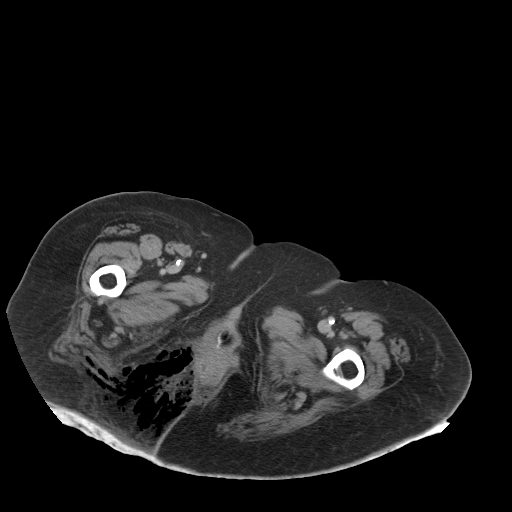
[im 39/109  soft-tissue]
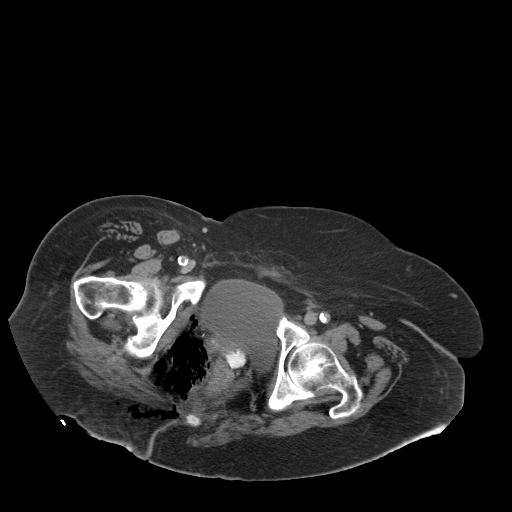
[im 45/109  soft-tissue]
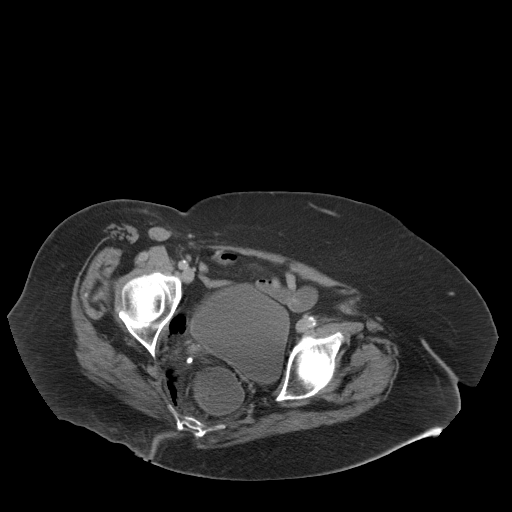
[im 58/109  soft-tissue]
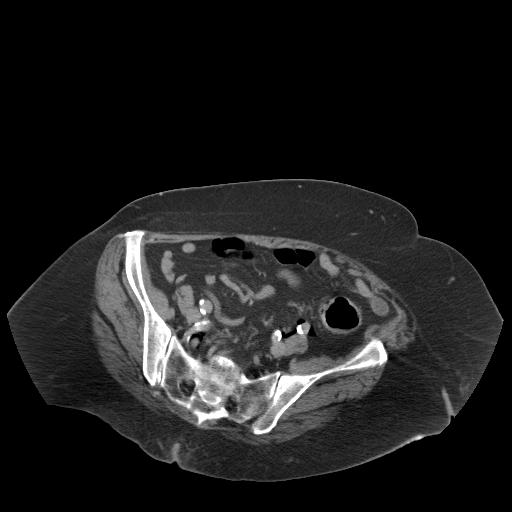
[im 64/109  soft-tissue]
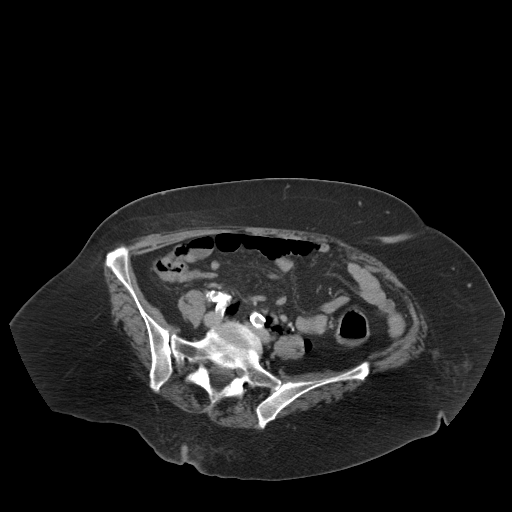
[im 70/109  soft-tissue]
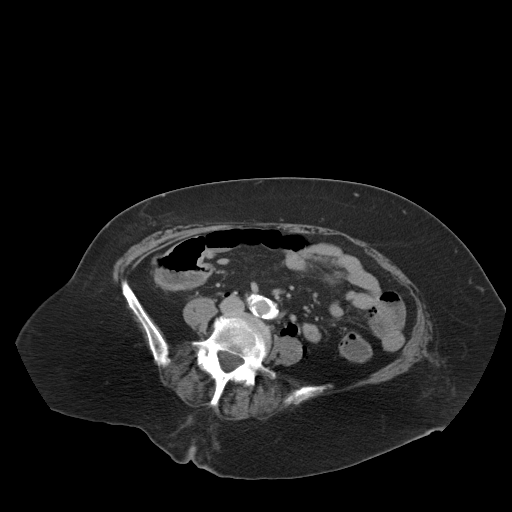
[im 83/109  soft-tissue]
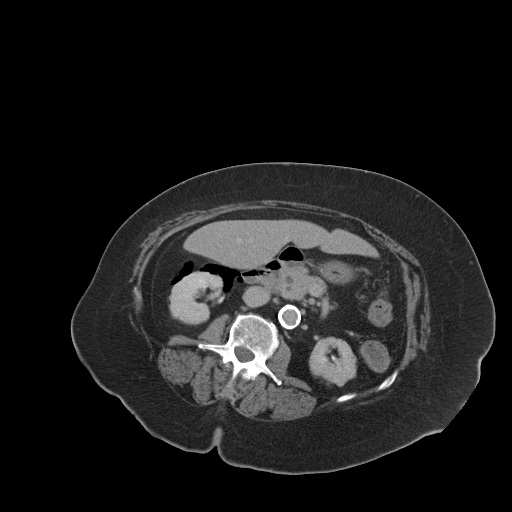
[im 83/109  bone]
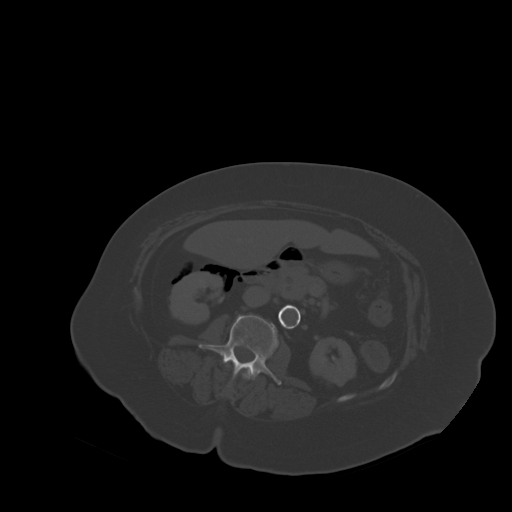
[im 89/109  soft-tissue]
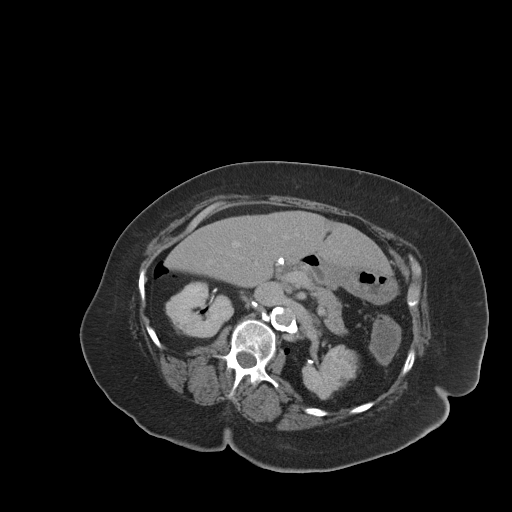
[im 102/109  soft-tissue]
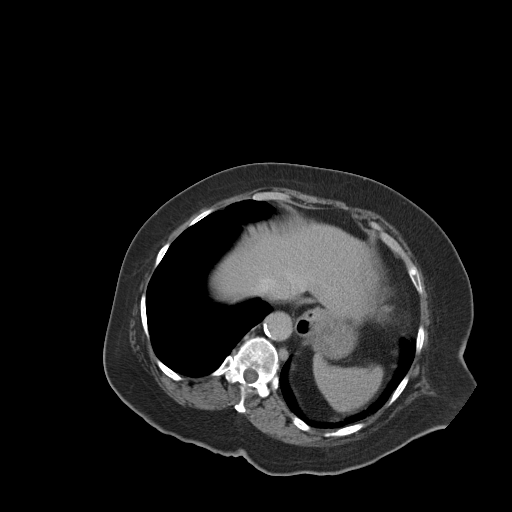

[Series 5: coronal a/|p · coronal · 0.74mm/px · 3 of 104 slices shown]
[im 35/104  soft-tissue]
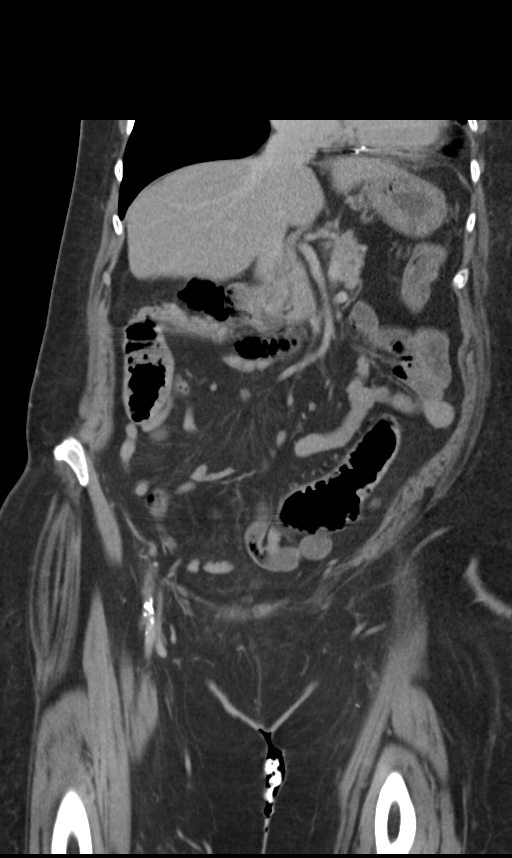
[im 46/104  soft-tissue]
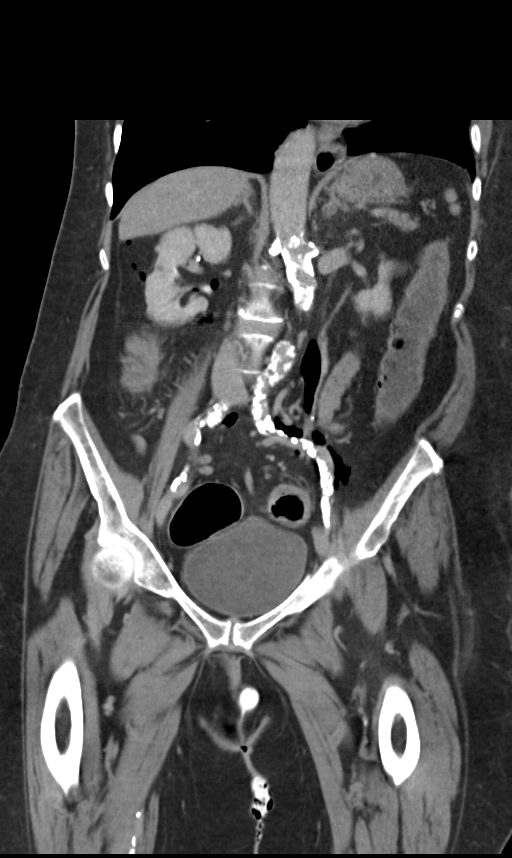
[im 58/104  soft-tissue]
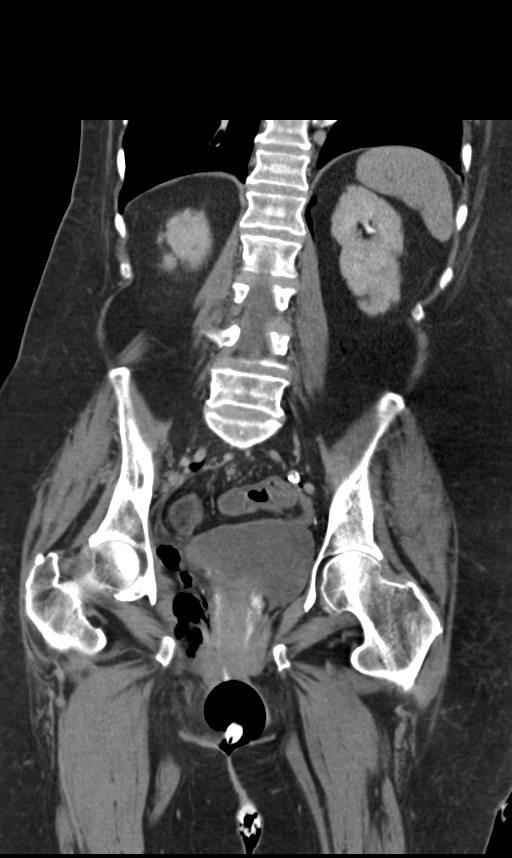

[14 of 46 positions shown; findings below may reference images not displayed]

FINDINGS: Lower chest: Dependent atelectasis left lower lobe. Normal heart
size. Small hiatal hernia.

Hepatobiliary: Liver is normal in size and contour without focal
hepatic lesion identified. Patient status post cholecystectomy.

Pancreas: Unremarkable

Spleen: Unremarkable

Adrenals/Urinary Tract: Stable thickening left adrenal gland. 2.2 cm
left renal cyst. Additional tiny bilateral lower attenuation renal
lesions too small to characterize.

Stomach/Bowel: There is wall thickening at the level of the rectum.
Additionally, best demonstrated on the coronal images (image 63;
series 5) there is suggestion of probable perforation and adjacent
small perirectal abscess. Wall thickening of the distal transverse
and descending colon. There is an extensive amount of gas within the
right gluteal soft tissues at the level of the rectum extending to
the gluteal musculature and cranially throughout the
retroperitoneum.

Vascular/Lymphatic: Extensive calcified atherosclerotic plaque
involving the abdominal aorta. Infrarenal abdominal aortic ectasia,
2.2 cm. No retroperitoneal lymphadenopathy.

Other: Catheter is present within the vagina. Contrast demonstrated
within the vagina.

Musculoskeletal: Lower lumbar spine degenerative changes. No
aggressive or acute appearing osseous lesions. Extensive amount of
soft tissue gas within the right gluteal subcutaneous fat extending
into the gluteal musculature. Additionally this gas extends
cranially along the retroperitoneum. Soft tissue thickening of the
right subcutaneous gluteal soft tissues.
IMPRESSION: Extensive soft tissue gas within the right gluteal fat extending
into the right gluteal musculature as well as extending cranially
throughout the retroperitoneum. Findings are concerning for an
aggressive infectious process in the setting of retroperitoneal
fasciitis. Potential causative etiology may be distal colitis with
perforation and associated abscess formation. Recommend surgical
consultation and broad-spectrum antibiotic therapy.

Wall thickening of the distal transverse and descending colon likely
secondary to colitis.

Critical Value/emergent results were called by telephone at the time
of interpretation on 08/22/2014 at [DATE] to BLAIN JUMPER, PA ,
who verbally acknowledged these results.
